# Patient Record
Sex: Male | Born: 1960 | Race: Black or African American | Hispanic: No | Marital: Single | Smoking: Former smoker
Health system: Southern US, Community
[De-identification: ages and names within clinical notes are randomized; demographics above are authoritative.]

## PROBLEM LIST (undated history)

## (undated) DIAGNOSIS — M069 Rheumatoid arthritis, unspecified: Secondary | ICD-10-CM

## (undated) DIAGNOSIS — E785 Hyperlipidemia, unspecified: Secondary | ICD-10-CM

## (undated) DIAGNOSIS — M549 Dorsalgia, unspecified: Secondary | ICD-10-CM

## (undated) DIAGNOSIS — M1990 Unspecified osteoarthritis, unspecified site: Secondary | ICD-10-CM

## (undated) DIAGNOSIS — M2550 Pain in unspecified joint: Secondary | ICD-10-CM

## (undated) DIAGNOSIS — D649 Anemia, unspecified: Secondary | ICD-10-CM

## (undated) DIAGNOSIS — G8929 Other chronic pain: Secondary | ICD-10-CM

## (undated) DIAGNOSIS — M2540 Effusion, unspecified joint: Secondary | ICD-10-CM

## (undated) HISTORY — PX: COLONOSCOPY: SHX174

## (undated) HISTORY — PX: EYE SURGERY: SHX253

## (undated) HISTORY — DX: Hyperlipidemia, unspecified: E78.5

## (undated) HISTORY — PX: JOINT REPLACEMENT: SHX530

---

## 2011-05-26 ENCOUNTER — Encounter (HOSPITAL_COMMUNITY): Payer: Self-pay | Admitting: Pharmacy Technician

## 2011-05-27 ENCOUNTER — Other Ambulatory Visit (HOSPITAL_COMMUNITY): Payer: Self-pay | Admitting: Orthopaedic Surgery

## 2011-05-30 ENCOUNTER — Other Ambulatory Visit (HOSPITAL_COMMUNITY): Payer: Self-pay | Admitting: *Deleted

## 2011-05-31 ENCOUNTER — Other Ambulatory Visit: Payer: Self-pay

## 2011-05-31 ENCOUNTER — Encounter (HOSPITAL_COMMUNITY)
Admission: RE | Admit: 2011-05-31 | Discharge: 2011-05-31 | Disposition: A | Discharge status: Home / Self Care | Payer: Medicare Other | Source: Ambulatory Visit | Attending: Orthopaedic Surgery | Admitting: Orthopaedic Surgery

## 2011-05-31 ENCOUNTER — Encounter (HOSPITAL_COMMUNITY): Payer: Self-pay

## 2011-05-31 HISTORY — DX: Unspecified osteoarthritis, unspecified site: M19.90

## 2011-05-31 LAB — SURGICAL PCR SCREEN: MRSA, PCR: POSITIVE — AB

## 2011-05-31 LAB — URINALYSIS, ROUTINE W REFLEX MICROSCOPIC
Bilirubin Urine: NEGATIVE
Glucose, UA: NEGATIVE mg/dL
Hgb urine dipstick: NEGATIVE
Ketones, ur: 15 mg/dL — AB
Protein, ur: NEGATIVE mg/dL
Urobilinogen, UA: 1 mg/dL (ref 0.0–1.0)

## 2011-05-31 LAB — BASIC METABOLIC PANEL
BUN: 9 mg/dL (ref 6–23)
Chloride: 103 mEq/L (ref 96–112)
Creatinine, Ser: 0.73 mg/dL (ref 0.50–1.35)
GFR calc Af Amer: >90 mL/min (ref >90)
GFR calc non Af Amer: >90 mL/min (ref >90)
Glucose, Bld: 105 mg/dL — ABNORMAL HIGH (ref 70–99)

## 2011-05-31 LAB — CBC
HCT: 32.8 % — ABNORMAL LOW (ref 39.0–52.0)
MCH: 23.1 pg — ABNORMAL LOW (ref 26.0–34.0)
MCHC: 32.9 g/dL (ref 30.0–36.0)
RDW: 14.7 % (ref 11.5–15.5)

## 2011-05-31 LAB — PROTIME-INR: INR: 1.06 (ref 0.00–1.49)

## 2011-05-31 LAB — APTT: aPTT: 33 seconds (ref 24–37)

## 2011-05-31 NOTE — Progress Notes (Signed)
Spoke with Dr. Eliberto Ivory office regarding pt/pt's family request that pt have inpt rehab after surgery.

## 2011-05-31 NOTE — Pre-Procedure Instructions (Signed)
20 Jonathan [name]  05/31/2011   Your procedure is scheduled on:  Tuesday, February 12  Report to [facility] at 10:30 AM.  Call this number if you have problems the morning of surgery: [phone]   Remember:   Do not eat food:After Midnight.  May have clear liquids: up to 4 Hours before arrival.  Clear liquids include soda, tea, black coffee, apple or grape juice, broth.  Take these medicines the morning of surgery with A SIP OF WATER: Norco if needed   Do not wear jewelry, make-up or nail polish.  Do not wear lotions, powders, or perfumes. You may wear deodorant.  Do not shave 48 hours prior to surgery.  Do not bring valuables to the hospital.  Contacts, dentures or bridgework may not be worn into surgery.  Leave suitcase in the car. After surgery it may be brought to your room.  For patients admitted to the hospital, checkout time is 11:00 AM the day of discharge.   Patients discharged the day of surgery will not be allowed to drive home.  Name and phone number of your driver: NA  Special Instructions: CHG Shower Use Special Wash: 1/2 bottle night before surgery and 1/2 bottle morning of surgery.   Please read over the following fact sheets that you were given: Pain Booklet, Coughing and Deep Breathing, Blood Transfusion Information and Surgical Site Infection Prevention

## 2011-06-06 MED ORDER — CEFAZOLIN SODIUM-DEXTROSE 2-3 GM-% IV SOLR
2.0000 g | INTRAVENOUS | Status: AC
  Administered 2011-06-07: 2 g via INTRAVENOUS
  Filled 2011-06-06: qty 50

## 2011-06-07 ENCOUNTER — Encounter (HOSPITAL_COMMUNITY): Payer: Self-pay | Admitting: *Deleted

## 2011-06-07 ENCOUNTER — Encounter (HOSPITAL_COMMUNITY): Payer: Self-pay | Admitting: Certified Registered"

## 2011-06-07 ENCOUNTER — Ambulatory Visit (HOSPITAL_COMMUNITY): Payer: Medicare Other

## 2011-06-07 ENCOUNTER — Encounter (HOSPITAL_COMMUNITY)
Admission: RE | Disposition: A | Discharge status: Skilled Nursing Facility | Payer: Self-pay | Source: Ambulatory Visit | Attending: Orthopaedic Surgery

## 2011-06-07 ENCOUNTER — Ambulatory Visit (HOSPITAL_COMMUNITY): Payer: Medicare Other | Admitting: Certified Registered"

## 2011-06-07 ENCOUNTER — Inpatient Hospital Stay (HOSPITAL_COMMUNITY)
Admission: RE | Admit: 2011-06-07 | Discharge: 2011-06-10 | DRG: 470 | Disposition: A | Discharge status: Skilled Nursing Facility | Payer: Medicare Other | Source: Ambulatory Visit | Attending: Orthopaedic Surgery | Admitting: Orthopaedic Surgery

## 2011-06-07 DIAGNOSIS — Z87891 Personal history of nicotine dependence: Secondary | ICD-10-CM

## 2011-06-07 DIAGNOSIS — M659 Unspecified synovitis and tenosynovitis, unspecified site: Secondary | ICD-10-CM | POA: Diagnosis present

## 2011-06-07 DIAGNOSIS — Z01818 Encounter for other preprocedural examination: Secondary | ICD-10-CM

## 2011-06-07 DIAGNOSIS — M179 Osteoarthritis of knee, unspecified: Secondary | ICD-10-CM

## 2011-06-07 DIAGNOSIS — Z01812 Encounter for preprocedural laboratory examination: Secondary | ICD-10-CM

## 2011-06-07 DIAGNOSIS — D62 Acute posthemorrhagic anemia: Secondary | ICD-10-CM | POA: Diagnosis not present

## 2011-06-07 DIAGNOSIS — M1710 Unilateral primary osteoarthritis, unspecified knee: Principal | ICD-10-CM | POA: Diagnosis present

## 2011-06-07 DIAGNOSIS — Z96659 Presence of unspecified artificial knee joint: Secondary | ICD-10-CM

## 2011-06-07 DIAGNOSIS — E7800 Pure hypercholesterolemia, unspecified: Secondary | ICD-10-CM | POA: Diagnosis present

## 2011-06-07 DIAGNOSIS — M069 Rheumatoid arthritis, unspecified: Secondary | ICD-10-CM | POA: Diagnosis present

## 2011-06-07 DIAGNOSIS — Z01810 Encounter for preprocedural cardiovascular examination: Secondary | ICD-10-CM

## 2011-06-07 HISTORY — PX: KNEE ARTHROPLASTY: SHX992

## 2011-06-07 LAB — TYPE AND SCREEN
ABO/RH(D): B POS
Antibody Screen: POSITIVE

## 2011-06-07 SURGERY — ARTHROPLASTY, KNEE, TOTAL, USING IMAGELESS COMPUTER-ASSISTED NAVIGATION
Anesthesia: Regional | Site: Knee | Laterality: Right | Wound class: Clean

## 2011-06-07 MED ORDER — HYDROMORPHONE HCL PF 1 MG/ML IJ SOLN
0.2500 mg | INTRAMUSCULAR | Status: DC | PRN
  Administered 2011-06-07 (×3): 0.5 mg via INTRAVENOUS

## 2011-06-07 MED ORDER — SODIUM CHLORIDE 0.9 % IJ SOLN: 9.0000 mL | INTRAMUSCULAR | Status: DC | PRN

## 2011-06-07 MED ORDER — SODIUM CHLORIDE 0.9 % IR SOLN
Status: DC | PRN
  Administered 2011-06-07: 3000 mL
  Administered 2011-06-07: 1000 mL

## 2011-06-07 MED ORDER — DIPHENHYDRAMINE HCL 12.5 MG/5ML PO ELIX
12.5000 mg | ORAL_SOLUTION | Freq: Four times a day (QID) | ORAL | Status: DC | PRN
  Filled 2011-06-07: qty 5

## 2011-06-07 MED ORDER — ONDANSETRON HCL 4 MG/2ML IJ SOLN: 4.0000 mg | Freq: Four times a day (QID) | INTRAMUSCULAR | Status: DC | PRN

## 2011-06-07 MED ORDER — LACTATED RINGERS IV SOLN
INTRAVENOUS | Status: DC | PRN
  Administered 2011-06-07 (×3): via INTRAVENOUS

## 2011-06-07 MED ORDER — OXYCODONE HCL 5 MG PO TABS
5.0000 mg | ORAL_TABLET | ORAL | Status: DC | PRN
  Administered 2011-06-08: 10 mg via ORAL
  Administered 2011-06-08: 5 mg via ORAL
  Administered 2011-06-08 – 2011-06-09 (×3): 10 mg via ORAL
  Administered 2011-06-10 (×3): 5 mg via ORAL
  Filled 2011-06-07 (×3): qty 1
  Filled 2011-06-07: qty 2
  Filled 2011-06-07: qty 1
  Filled 2011-06-07 (×3): qty 2

## 2011-06-07 MED ORDER — ONDANSETRON HCL 4 MG/2ML IJ SOLN
INTRAMUSCULAR | Status: DC | PRN
  Administered 2011-06-07: 4 mg via INTRAVENOUS

## 2011-06-07 MED ORDER — LACTATED RINGERS IV SOLN
INTRAVENOUS | Status: DC
  Administered 2011-06-07: 12:00:00 via INTRAVENOUS

## 2011-06-07 MED ORDER — DOCUSATE SODIUM 100 MG PO CAPS
100.0000 mg | ORAL_CAPSULE | Freq: Two times a day (BID) | ORAL | Status: DC
  Administered 2011-06-07 – 2011-06-09 (×4): 100 mg via ORAL
  Administered 2011-06-09: 200 mg via ORAL
  Administered 2011-06-10: 100 mg via ORAL
  Filled 2011-06-07 (×8): qty 1

## 2011-06-07 MED ORDER — ONDANSETRON HCL 4 MG PO TABS: 4.0000 mg | ORAL_TABLET | Freq: Four times a day (QID) | ORAL | Status: DC | PRN

## 2011-06-07 MED ORDER — RIVAROXABAN 10 MG PO TABS
10.0000 mg | ORAL_TABLET | Freq: Every day | ORAL | Status: DC
  Administered 2011-06-08 – 2011-06-10 (×3): 10 mg via ORAL
  Filled 2011-06-07 (×4): qty 1

## 2011-06-07 MED ORDER — MIDAZOLAM HCL 5 MG/5ML IJ SOLN
INTRAMUSCULAR | Status: DC | PRN
  Administered 2011-06-07: 2 mg via INTRAVENOUS

## 2011-06-07 MED ORDER — MIDAZOLAM HCL 2 MG/2ML IJ SOLN
INTRAMUSCULAR | Status: AC
  Filled 2011-06-07: qty 2

## 2011-06-07 MED ORDER — SIMVASTATIN 20 MG PO TABS
20.0000 mg | ORAL_TABLET | Freq: Every day | ORAL | Status: DC
  Administered 2011-06-07 – 2011-06-10 (×4): 20 mg via ORAL
  Filled 2011-06-07 (×5): qty 1

## 2011-06-07 MED ORDER — DIPHENHYDRAMINE HCL 12.5 MG/5ML PO ELIX
12.5000 mg | ORAL_SOLUTION | ORAL | Status: DC | PRN
  Filled 2011-06-07: qty 10

## 2011-06-07 MED ORDER — FENTANYL CITRATE 0.05 MG/ML IJ SOLN
50.0000 ug | INTRAMUSCULAR | Status: DC | PRN
  Administered 2011-06-07: 100 ug via INTRAVENOUS

## 2011-06-07 MED ORDER — MIDAZOLAM HCL 2 MG/2ML IJ SOLN
1.0000 mg | INTRAMUSCULAR | Status: DC | PRN
  Administered 2011-06-07: 2 mg via INTRAVENOUS

## 2011-06-07 MED ORDER — ACETAMINOPHEN 650 MG RE SUPP: 650.0000 mg | Freq: Four times a day (QID) | RECTAL | Status: DC | PRN

## 2011-06-07 MED ORDER — NALOXONE HCL 0.4 MG/ML IJ SOLN: 0.4000 mg | INTRAMUSCULAR | Status: DC | PRN

## 2011-06-07 MED ORDER — FENTANYL CITRATE 0.05 MG/ML IJ SOLN
INTRAMUSCULAR | Status: DC | PRN
  Administered 2011-06-07 (×8): 25 ug via INTRAVENOUS
  Administered 2011-06-07: 50 ug via INTRAVENOUS
  Administered 2011-06-07: 25 ug via INTRAVENOUS

## 2011-06-07 MED ORDER — PHENOL 1.4 % MT LIQD
1.0000 | OROMUCOSAL | Status: DC | PRN
  Filled 2011-06-07: qty 177

## 2011-06-07 MED ORDER — SODIUM CHLORIDE 0.9 % IV SOLN
INTRAVENOUS | Status: DC
  Administered 2011-06-08 – 2011-06-09 (×2): via INTRAVENOUS

## 2011-06-07 MED ORDER — METHOCARBAMOL 500 MG PO TABS
500.0000 mg | ORAL_TABLET | Freq: Four times a day (QID) | ORAL | Status: DC | PRN
  Administered 2011-06-08 – 2011-06-10 (×5): 500 mg via ORAL
  Filled 2011-06-07 (×5): qty 1

## 2011-06-07 MED ORDER — MORPHINE SULFATE 2 MG/ML IJ SOLN: 1.0000 mg | INTRAMUSCULAR | Status: DC | PRN

## 2011-06-07 MED ORDER — DEXTROSE 5 % IV SOLN
500.0000 mg | Freq: Four times a day (QID) | INTRAVENOUS | Status: DC | PRN
  Filled 2011-06-07: qty 5

## 2011-06-07 MED ORDER — HYDROMORPHONE HCL PF 1 MG/ML IJ SOLN
INTRAMUSCULAR | Status: AC
  Administered 2011-06-07: 0.5 mg via INTRAVENOUS
  Filled 2011-06-07: qty 1

## 2011-06-07 MED ORDER — PROPOFOL 10 MG/ML IV EMUL
INTRAVENOUS | Status: DC | PRN
  Administered 2011-06-07: 200 mL via INTRAVENOUS

## 2011-06-07 MED ORDER — METHOCARBAMOL 100 MG/ML IJ SOLN
500.0000 mg | Freq: Once | INTRAVENOUS | Status: AC
  Administered 2011-06-07: 500 mg via INTRAVENOUS
  Filled 2011-06-07: qty 5

## 2011-06-07 MED ORDER — BUPIVACAINE-EPINEPHRINE PF 0.5-1:200000 % IJ SOLN
INTRAMUSCULAR | Status: DC | PRN
  Administered 2011-06-07: 30 mL

## 2011-06-07 MED ORDER — FENTANYL CITRATE 0.05 MG/ML IJ SOLN
INTRAMUSCULAR | Status: AC
  Filled 2011-06-07: qty 2

## 2011-06-07 MED ORDER — METOCLOPRAMIDE HCL 5 MG/ML IJ SOLN
5.0000 mg | Freq: Three times a day (TID) | INTRAMUSCULAR | Status: DC | PRN
  Filled 2011-06-07: qty 2

## 2011-06-07 MED ORDER — MORPHINE SULFATE (PF) 1 MG/ML IV SOLN
INTRAVENOUS | Status: DC
  Administered 2011-06-07: 17:00:00 via INTRAVENOUS
  Administered 2011-06-07: 1.5 mg via INTRAVENOUS
  Administered 2011-06-08: 4.5 mg via INTRAVENOUS
  Administered 2011-06-08: 6 mg via INTRAVENOUS
  Administered 2011-06-08: 18 mg via INTRAVENOUS

## 2011-06-07 MED ORDER — ACETAMINOPHEN 10 MG/ML IV SOLN
INTRAVENOUS | Status: DC | PRN
  Administered 2011-06-07: 1000 mg via INTRAVENOUS

## 2011-06-07 MED ORDER — ACETAMINOPHEN 10 MG/ML IV SOLN
INTRAVENOUS | Status: AC
  Filled 2011-06-07: qty 100

## 2011-06-07 MED ORDER — ACETAMINOPHEN 325 MG PO TABS
650.0000 mg | ORAL_TABLET | Freq: Four times a day (QID) | ORAL | Status: DC | PRN
  Administered 2011-06-08 – 2011-06-09 (×2): 650 mg via ORAL
  Filled 2011-06-07 (×2): qty 2

## 2011-06-07 MED ORDER — ZOLPIDEM TARTRATE 5 MG PO TABS: 5.0000 mg | ORAL_TABLET | Freq: Every evening | ORAL | Status: DC | PRN

## 2011-06-07 MED ORDER — ONDANSETRON HCL 4 MG/2ML IJ SOLN
4.0000 mg | Freq: Four times a day (QID) | INTRAMUSCULAR | Status: DC | PRN
  Administered 2011-06-09: 4 mg via INTRAVENOUS
  Filled 2011-06-07: qty 2

## 2011-06-07 MED ORDER — MORPHINE SULFATE (PF) 1 MG/ML IV SOLN
INTRAVENOUS | Status: AC
  Filled 2011-06-07: qty 25

## 2011-06-07 MED ORDER — CEFAZOLIN SODIUM 1-5 GM-% IV SOLN
1.0000 g | Freq: Four times a day (QID) | INTRAVENOUS | Status: AC
  Administered 2011-06-07 – 2011-06-08 (×3): 1 g via INTRAVENOUS
  Filled 2011-06-07 (×3): qty 50

## 2011-06-07 MED ORDER — HYDROCODONE-ACETAMINOPHEN 5-325 MG PO TABS: 1.0000 | ORAL_TABLET | ORAL | Status: DC | PRN

## 2011-06-07 MED ORDER — MENTHOL 3 MG MT LOZG: 1.0000 | LOZENGE | OROMUCOSAL | Status: DC | PRN

## 2011-06-07 MED ORDER — DIPHENHYDRAMINE HCL 50 MG/ML IJ SOLN: 12.5000 mg | Freq: Four times a day (QID) | INTRAMUSCULAR | Status: DC | PRN

## 2011-06-07 MED ORDER — ALUM & MAG HYDROXIDE-SIMETH 200-200-20 MG/5ML PO SUSP
30.0000 mL | ORAL | Status: DC | PRN
  Administered 2011-06-09: 30 mL via ORAL
  Filled 2011-06-07: qty 30

## 2011-06-07 MED ORDER — METOCLOPRAMIDE HCL 10 MG PO TABS: 5.0000 mg | ORAL_TABLET | Freq: Three times a day (TID) | ORAL | Status: DC | PRN

## 2011-06-07 SURGICAL SUPPLY — 62 items
BANDAGE ELASTIC 6 VELCRO ST LF (GAUZE/BANDAGES/DRESSINGS) ×2 IMPLANT
BANDAGE ESMARK 6X9 LF (GAUZE/BANDAGES/DRESSINGS) ×1 IMPLANT
BLADE SAGITTAL 25.0X1.19X90 (BLADE) ×4 IMPLANT
BLADE SAW SGTL 13.0X1.19X90.0M (BLADE) ×2 IMPLANT
BNDG ESMARK 6X9 LF (GAUZE/BANDAGES/DRESSINGS) ×2
BOWL SMART MIX CTS (DISPOSABLE) ×2 IMPLANT
CEMENT HV SMART SET (Cement) ×4 IMPLANT
CLOTH BEACON ORANGE TIMEOUT ST (SAFETY) ×2 IMPLANT
COVER BACK TABLE 24X17X13 BIG (DRAPES) IMPLANT
COVER SURGICAL LIGHT HANDLE (MISCELLANEOUS) ×2 IMPLANT
CUFF TOURNIQUET SINGLE 34IN LL (TOURNIQUET CUFF) ×2 IMPLANT
CUFF TOURNIQUET SINGLE 44IN (TOURNIQUET CUFF) IMPLANT
DRAPE INCISE IOBAN 66X45 STRL (DRAPES) ×2 IMPLANT
DRAPE ORTHO SPLIT 77X108 STRL (DRAPES) ×2
DRAPE PROXIMA HALF (DRAPES) ×2 IMPLANT
DRAPE SURG ORHT 6 SPLT 77X108 (DRAPES) ×2 IMPLANT
DRAPE U-SHAPE 47X51 STRL (DRAPES) ×2 IMPLANT
DRSG PAD ABDOMINAL 8X10 ST (GAUZE/BANDAGES/DRESSINGS) ×2 IMPLANT
DURAPREP 26ML APPLICATOR (WOUND CARE) ×2 IMPLANT
ELECT REM PT RETURN 9FT ADLT (ELECTROSURGICAL) ×2
ELECTRODE REM PT RTRN 9FT ADLT (ELECTROSURGICAL) ×1 IMPLANT
EVACUATOR 1/8 PVC DRAIN (DRAIN) ×2 IMPLANT
FACESHIELD LNG OPTICON STERILE (SAFETY) ×6 IMPLANT
GAUZE SPONGE 4X4 12PLY STRL LF (GAUZE/BANDAGES/DRESSINGS) ×2 IMPLANT
GAUZE XEROFORM 5X9 LF (GAUZE/BANDAGES/DRESSINGS) ×2 IMPLANT
GLOVE BIO SURGEON STRL SZ7.5 (GLOVE) ×2 IMPLANT
GLOVE BIOGEL PI IND STRL 8 (GLOVE) ×1 IMPLANT
GLOVE BIOGEL PI INDICATOR 8 (GLOVE) ×1
GLOVE ECLIPSE 7.0 STRL STRAW (GLOVE) ×2 IMPLANT
GLOVE ORTHO TXT STRL SZ7.5 (GLOVE) ×2 IMPLANT
GOWN PREVENTION PLUS LG XLONG (DISPOSABLE) IMPLANT
GOWN PREVENTION PLUS XLARGE (GOWN DISPOSABLE) ×2 IMPLANT
GOWN STRL NON-REIN LRG LVL3 (GOWN DISPOSABLE) ×4 IMPLANT
HANDPIECE INTERPULSE COAX TIP (DISPOSABLE)
IMMOBILIZER KNEE 22 UNIV (SOFTGOODS) ×2 IMPLANT
KIT BASIN OR (CUSTOM PROCEDURE TRAY) ×2 IMPLANT
KIT ROOM TURNOVER OR (KITS) ×2 IMPLANT
MANIFOLD NEPTUNE II (INSTRUMENTS) ×2 IMPLANT
MARKER SPHERE PSV REFLC THRD 5 (MARKER) ×6 IMPLANT
NEEDLE SPNL 18GX3.5 QUINCKE PK (NEEDLE) ×2 IMPLANT
NS IRRIG 1000ML POUR BTL (IV SOLUTION) ×2 IMPLANT
PACK TOTAL JOINT (CUSTOM PROCEDURE TRAY) ×2 IMPLANT
PAD ARMBOARD 7.5X6 YLW CONV (MISCELLANEOUS) ×4 IMPLANT
PADDING CAST COTTON 6X4 STRL (CAST SUPPLIES) ×2 IMPLANT
PADDING WEBRIL 6 STERILE (GAUZE/BANDAGES/DRESSINGS) ×2 IMPLANT
PIN SCHANZ 4MM 130MM (PIN) IMPLANT
SET HNDPC FAN SPRY TIP SCT (DISPOSABLE) IMPLANT
SPONGE GAUZE 4X4 12PLY (GAUZE/BANDAGES/DRESSINGS) ×2 IMPLANT
STAPLER VISISTAT 35W (STAPLE) ×2 IMPLANT
STRIP CLOSURE SKIN 1/2X4 (GAUZE/BANDAGES/DRESSINGS) ×4 IMPLANT
SUCTION FRAZIER TIP 10 FR DISP (SUCTIONS) IMPLANT
SUT ETHILON 3 0 PS 1 (SUTURE) ×2 IMPLANT
SUT VIC AB 1 CT1 27 (SUTURE) ×2
SUT VIC AB 1 CT1 27XBRD ANBCTR (SUTURE) ×2 IMPLANT
SUT VIC AB 2-0 CT1 27 (SUTURE) ×2
SUT VIC AB 2-0 CT1 TAPERPNT 27 (SUTURE) ×2 IMPLANT
SUT VIC AB 4-0 PS2 27 (SUTURE) ×2 IMPLANT
SWABSTICK BENZOIN STERILE (MISCELLANEOUS) ×2 IMPLANT
TOWEL OR 17X24 6PK STRL BLUE (TOWEL DISPOSABLE) ×2 IMPLANT
TOWEL OR 17X26 10 PK STRL BLUE (TOWEL DISPOSABLE) ×2 IMPLANT
TRAY FOLEY CATH 14FR (SET/KITS/TRAYS/PACK) ×2 IMPLANT
WATER STERILE IRR 1000ML POUR (IV SOLUTION) ×2 IMPLANT

## 2011-06-07 NOTE — Progress Notes (Signed)
Report to Kay RN as caregiver.  

## 2011-06-07 NOTE — Preoperative (Signed)
Beta Blockers   Reason not to administer Beta Blockers:Not Applicable 

## 2011-06-07 NOTE — Progress Notes (Signed)
Orthopedic Tech Progress Note Patient Details:  Jonathan [name] 05-09-1960 [id number] Applied cpm and overhead frame Patient ID: Jonathan [name], male   DOB: 02/20/1961, 51 y.o.   MRN: [id number]   [name] 06/07/2011, 5:26 PM

## 2011-06-07 NOTE — Brief Op Note (Signed)
06/07/2011  3:07 PM  PATIENT:  Jonathan [name]  51 y.o. male  PRE-OPERATIVE DIAGNOSIS:  severe arthritis right knee  POST-OPERATIVE DIAGNOSIS:  severe arthritis right knee  PROCEDURE:  Procedure(s) (LRB): COMPUTER ASSISTED TOTAL KNEE ARTHROPLASTY (Right)  SURGEON:  Surgeon(s) and Role:    * Kathryne Hitch, MD - Primary  PHYSICIAN ASSISTANT:   ASSISTANTS: Patrick Jupiter, RNFA   ANESTHESIA:   general  EBL:  Total I/O In: 2250 [I.V.:2250] Out: 375 [Urine:275; Blood:100]  BLOOD ADMINISTERED:none  DRAINS: (medium) Hemovact drain(s) in the knee with  Suction Open   LOCAL MEDICATIONS USED:  NONE  SPECIMEN:  No Specimen  DISPOSITION OF SPECIMEN:  N/A  COUNTS:  YES  TOURNIQUET:   Total Tourniquet Time Documented: Thigh (Right) - 81 minutes  DICTATION: .Other Dictation: Dictation Number [id number]  PLAN OF CARE: Admit to inpatient   PATIENT DISPOSITION:  PACU - hemodynamically stable.   Delay start of Pharmacological VTE agent (>24hrs) due to surgical blood loss or risk of bleeding: yes

## 2011-06-07 NOTE — Anesthesia Procedure Notes (Signed)
Anesthesia Regional Block:  Femoral nerve block  Pre-Anesthetic Checklist: ,, timeout performed, Correct Patient, Correct Site, Correct Laterality, Correct Procedure,, site marked, risks and benefits discussed, Surgical consent,  Pre-op evaluation,  At surgeon's request and post-op pain management  Laterality: Right  Prep: chloraprep       Needles:  Injection technique: Single-shot  Needle Type: Echogenic Stimulator Needle     Needle Length: 9cm  Needle Gauge: 21    Additional Needles:  Procedures: nerve stimulator Femoral nerve block  Nerve Stimulator or Paresthesia:  Response: Quadriceps muscle contraction, 0.45 mA,   Additional Responses:   Narrative:  Start time: 06/07/2011 11:50 AM End time: 06/07/2011 12:05 PM Injection made incrementally with aspirations every 5 mL.  Performed by: Personally  Anesthesiologist: Dr Chaney Malling  Additional Notes: Functioning IV was confirmed and monitors were applied.  A 90mm 21ga Arrow echogenic stimulator needle was used. Sterile prep and drape,hand hygiene and sterile gloves were used.  Negative aspiration and negative test dose prior to incremental administration of local anesthetic. The patient tolerated the procedure well.    Femoral nerve block

## 2011-06-07 NOTE — Transfer of Care (Signed)
Immediate Anesthesia Transfer of Care Note  Patient: Jonathan [name]  Procedure(s) Performed: Procedure(s) (LRB): COMPUTER ASSISTED TOTAL KNEE ARTHROPLASTY (Right)  Patient Location: PACU  Anesthesia Type: General  Level of Consciousness: awake, alert , oriented and patient cooperative  Airway & Oxygen Therapy: Patient Spontanous Breathing and Patient connected to nasal cannula oxygen  Post-op Assessment: Report given to PACU RN, Post -op Vital signs reviewed and stable and Patient moving all extremities  Post vital signs: Reviewed and stable  Complications: No apparent anesthesia complications

## 2011-06-07 NOTE — Anesthesia Preprocedure Evaluation (Signed)
Anesthesia Evaluation  Patient identified by MRN, date of birth, ID band Patient awake    Reviewed: Allergy & Precautions, H&P , NPO status , Patient's Chart, lab work & pertinent test results  Airway Mallampati: II  Neck ROM: full    Dental   Pulmonary former smoker         Cardiovascular     Neuro/Psych    GI/Hepatic   Endo/Other    Renal/GU      Musculoskeletal  (+) Arthritis -, Rheumatoid disorders,    Abdominal   Peds  Hematology   Anesthesia Other Findings   Reproductive/Obstetrics                           Anesthesia Physical Anesthesia Plan  ASA: II  Anesthesia Plan: General and Regional   Post-op Pain Management: MAC Combined w/ Regional for Post-op pain   Induction: Intravenous  Airway Management Planned: LMA  Additional Equipment:   Intra-op Plan:   Post-operative Plan:   Informed Consent: I have reviewed the patients History and Physical, chart, labs and discussed the procedure including the risks, benefits and alternatives for the proposed anesthesia with the patient or authorized representative who has indicated his/her understanding and acceptance.     Plan Discussed with: CRNA and Surgeon  Anesthesia Plan Comments:         Anesthesia Quick Evaluation

## 2011-06-07 NOTE — Anesthesia Postprocedure Evaluation (Signed)
  Anesthesia Post-op Note  Patient: Jonathan [name]  Procedure(s) Performed: Procedure(s) (LRB): COMPUTER ASSISTED TOTAL KNEE ARTHROPLASTY (Right)  Patient Location: PACU  Anesthesia Type: General  Level of Consciousness: awake  Airway and Oxygen Therapy: Patient Spontanous Breathing  Post-op Pain: mild  Post-op Assessment: Post-op Vital signs reviewed  Post-op Vital Signs: stable  Complications: No apparent anesthesia complications

## 2011-06-07 NOTE — H&P (Signed)
Jonathan [name] is an 51 y.o. male.   Chief Complaint:   Severe right knee pain with known OA/DJD HPI:   51 yo male with severe right knee pain and know arthritis.  Failed injections and arthroscopic surgery.  Has had previous successful left total knee replacement.  Wishes to now proceed with a right total knee replacement.  Understands the risks of blood loss, nerve and vessle injury, DVT and PE.  The benefits are improved mobility and quality of life and decreased pain.    Past Medical History  Diagnosis Date  . Arthritis     rheumatoid  . Hypercholesterolemia     Past Surgical History  Procedure Date  . Joint replacement     L TKA    History reviewed. No pertinent family history. Social History:  reports that he has quit smoking. He does not have any smokeless tobacco history on file. He reports that he does not drink alcohol or use illicit drugs.  Allergies: No Known Allergies  Medications Prior to Admission  Medication Dose Route Frequency Provider Last Rate Last Dose  . ceFAZolin (ANCEF) IVPB 2 g/50 mL premix  2 g Intravenous 60 min Pre-Op Kathryne Hitch, MD      . fentaNYL (SUBLIMAZE) injection 50 mcg  50 mcg Intravenous PRN Raiford Simmonds, MD   100 mcg at 06/07/11 1152  . lactated ringers infusion   Intravenous Continuous Raiford Simmonds, MD 50 mL/hr at 06/07/11 1150    . midazolam (VERSED) injection 1 mg  1 mg Intravenous PRN Raiford Simmonds, MD   2 mg at 06/07/11 1152   No current outpatient prescriptions on file as of 06/07/2011.    Results for orders placed during the hospital encounter of 06/07/11 (from the past 48 hour(s))  TYPE AND SCREEN     Status: Normal   Collection Time   06/07/11  9:50 AM      Component Value Range Comment   ABO/RH(D) B POS      Antibody Screen POS      Sample Expiration 06/10/2011      No results found.  Review of Systems  All other systems reviewed and are negative.    Blood pressure 120/81, pulse 89, temperature 98.9 F  (37.2 C), temperature source Oral, resp. rate 18, SpO2 99.00%. Physical Exam  Constitutional: He is oriented to person, place, and time. He appears well-developed and well-nourished.  HENT:  Head: Normocephalic and atraumatic.  Eyes: EOM are normal. Pupils are equal, round, and reactive to light.  Neck: Normal range of motion. Neck supple.  Cardiovascular: Normal rate and regular rhythm.   Respiratory: Effort normal and breath sounds normal.  GI: Soft. Bowel sounds are normal.  Musculoskeletal:       Right knee: He exhibits decreased range of motion and effusion. tenderness found. Medial joint line and lateral joint line tenderness noted.  Neurological: He is alert and oriented to person, place, and time.  Skin: Skin is warm and dry.  Psychiatric: He has a normal mood and affect.     Assessment/Plan To the OR today for a right total knee replacement then admission as an inpatient.  Ferdinando Lodge Y 06/07/2011, 11:58 AM

## 2011-06-08 ENCOUNTER — Encounter (HOSPITAL_COMMUNITY): Payer: Self-pay | Admitting: Orthopaedic Surgery

## 2011-06-08 LAB — CBC
MCHC: 32.9 g/dL (ref 30.0–36.0)
Platelets: 335 10*3/uL (ref 150–400)
RDW: 15 % (ref 11.5–15.5)
WBC: 7.1 10*3/uL (ref 4.0–10.5)

## 2011-06-08 LAB — GLUCOSE, CAPILLARY
Glucose-Capillary: 162 mg/dL — ABNORMAL HIGH (ref 70–99)
Glucose-Capillary: 122 mg/dL — ABNORMAL HIGH (ref 70–99)
Glucose-Capillary: 159 mg/dL — ABNORMAL HIGH (ref 70–99)

## 2011-06-08 LAB — BASIC METABOLIC PANEL
BUN: 8 mg/dL (ref 6–23)
GFR calc Af Amer: >90 mL/min (ref >90)
GFR calc non Af Amer: >90 mL/min (ref >90)
Potassium: 3.7 mEq/L (ref 3.5–5.1)
Sodium: 133 mEq/L — ABNORMAL LOW (ref 135–145)

## 2011-06-08 MED ORDER — MORPHINE SULFATE (PF) 1 MG/ML IV SOLN
INTRAVENOUS | Status: AC
  Administered 2011-06-08: 9 mg
  Filled 2011-06-08: qty 25

## 2011-06-08 NOTE — Progress Notes (Signed)
Clinical Social Worker completed psychosocial assessment.  Please see shadow chart for details.  CSW to fax pt out to [facility] snfs and review bed offers with pt and family on 2/14.  Angelia Mould, MSW, [place] [phone]

## 2011-06-08 NOTE — Progress Notes (Signed)
PT Treatment Note   06/08/11 1500  PT Visit Information  Last PT Received On 06/08/11  Precautions  Precautions Knee  Restrictions  RLE Weight Bearing WBAT  Bed Mobility  Bed Mobility Yes  Sit to Supine 4: Min assist  Sit to Supine - Details (indicate cue type and reason) assist for R LE managment  Transfers  Sit to Stand 3: Mod assist  Sit to Stand Details (indicate cue type and reason) increased assist from decreased surface height due to decreased UE strength secondary to RA in bilat hands  Ambulation/Gait  Ambulation/Gait Assistance 4: Min assist  Ambulation/Gait Assistance Details (indicate cue type and reason) cues to complete R quad set, pt able to tolerate ambulation without KI  Ambulation Distance (Feet) 150 Feet  Assistive device Rolling walker  Gait Pattern Step-to pattern  Gait velocity decreased  Stairs No  Wheelchair Mobility  Wheelchair Mobility No  Posture/Postural Control  Posture/Postural Control No significant limitations  Exercises  Exercises Total Joint  Total Joint Exercises  Ankle Circles/Pumps AROM;10 reps;Seated;Both  Quad Sets AROM;Right;10 reps  Heel Slides AROM;Right;10 reps;Seated (with towel beneath foot)  Hip ABduction/ADduction Right;10 reps;Supine;AAROM  Straight Leg Raises AROM;Right;10 reps;Supine  PT - End of Session  Equipment Utilized During Treatment Gait belt  Activity Tolerance Patient tolerated treatment well  Patient left in bed;in CPM;with call bell in reach (ortho tech called to make adjustment for optimal positioning)  Nurse Communication (RN notified pt left in CPM)  General  Behavior During Session [facility] for tasks performed  Cognition [facility] for tasks performed  PT - Assessment/Plan  Comments on Treatment Session Patient able to tolerate ambulation without R KI this date with no episode of buckling. patient con't to have decreased active R knee flexion.  PT Plan Discharge plan remains appropriate;Frequency remains appropriate    PT Frequency 7X/week  Follow Up Recommendations Skilled nursing facility  Equipment Recommended Defer to next venue  Acute Rehab PT Goals  PT Goal Formulation With patient  PT Goal: Supine/Side to Sit - Progress Progressing toward goal  PT Goal: Sit to Stand - Progress Progressing toward goal  PT Goal: Ambulate - Progress Progressing toward goal  PT Goal: Perform Home Exercise Program - Progress Progressing toward goal   Lewis Shock, PT, DPT Pager #: [phone] Office #: [phone]

## 2011-06-08 NOTE — Progress Notes (Signed)
CARE MANAGEMENT NOTE 06/08/2011  Action/Plan:   Discharge planning.Spoke with patient. He was preoperatively setup with [facility]. States he needs to go to a facility prior to home. He has no help at home,lives with his cousin and he is ill.   Anticipated DC Date:  06/10/2011   Anticipated DC Plan:  SKILLED NURSING FACILITY  In-house referral  Clinical Social Worker  DC Planning Services CM consult Status of service:  Completed, signed off

## 2011-06-08 NOTE — Evaluation (Signed)
Occupational Therapy Evaluation Patient Details Name: Jonathan [name] MRN: [id number] DOB: 13-Apr-1961 Today's Date: 06/08/2011  Problem List:  Patient Active Problem List  Diagnoses  . Degenerative arthritis of knee    Past Medical History:  Past Medical History  Diagnosis Date  . Arthritis     rheumatoid  . Hypercholesterolemia    Past Surgical History:  Past Surgical History  Procedure Date  . Joint replacement     L TKA  . Knee arthroplasty 06/07/2011    Procedure: COMPUTER ASSISTED TOTAL KNEE ARTHROPLASTY;  Surgeon: Kathryne Hitch, MD;  Location: [facility] OR;  Service: Orthopedics;  Laterality: Right;  Right total knee arthoplasty    OT Assessment/Plan/Recommendation OT Assessment Clinical Impression Statement: Pt. presents s/p Rt. TKA and with increased pain affecting PLOF of indpendent. Pt. will benefit from skilled OT to increase functional level to supervision level at D/C OT Recommendation/Assessment: Patient will need skilled OT in the acute care venue OT Problem List: Decreased strength;Decreased activity tolerance;Decreased safety awareness;Decreased knowledge of use of DME or AE;Decreased knowledge of precautions Barriers to Discharge: None OT Therapy Diagnosis : Generalized weakness;Acute pain OT Plan OT Frequency: Min 2X/week OT Treatment/Interventions: Self-care/ADL training;DME and/or AE instruction;Therapeutic activities;Patient/family education;Balance training OT Recommendation Follow Up Recommendations: Skilled nursing facility;Home health OT;Supervision - Intermittent Equipment Recommended: Defer to next venue Individuals Consulted Consulted and Agree with Results and Recommendations: Patient OT Goals Acute Rehab OT Goals OT Goal Formulation: With patient Time For Goal Achievement: 7 days ADL Goals Pt Will Perform Grooming: with set-up;with supervision;[name] at sink ADL Goal: Grooming - Progress: Goal set today Pt Will Perform Lower Body  Dressing: with set-up;with supervision;with adaptive equipment;Sit to stand from chair ADL Goal: Lower Body Dressing - Progress: Goal set today Pt Will Transfer to Toilet: with set-up;with supervision;3-in-1;with DME;Ambulation ADL Goal: Toilet Transfer - Progress: Goal set today  OT Evaluation Precautions/Restrictions  Precautions Precautions: Knee Required Braces or Orthoses: Yes Knee Immobilizer: Discontinue once straight leg raise with < 10 degree lag Restrictions Weight Bearing Restrictions: Yes RLE Weight Bearing: Weight bearing as tolerated Prior Functioning Home Living Lives With: Family (cousins) Receives Help From: Family Type of Home: House Home Layout: One level Home Access: Level entry Bathroom Shower/Tub: Child psychotherapist: Standard Home Adaptive Equipment: Straight cane Additional Comments: Patient reports he desires to go to [facility] rehab Prior Function Level of Independence: Requires assistive device for independence;Independent with basic ADLs;Independent with gait;Independent with transfers Driving: No Vocation: Retired ADL ADL Eating/Feeding: Performed;Independent Where Assessed - Eating/Feeding: Chair Grooming: Performed;Wash/dry hands;Set up;Minimal assistance Grooming Details (indicate cue type and reason): Min assist for balance and min verbal cues for safety with RW  Where Assessed - Grooming: [name] at sink Upper Body Bathing: Simulated;Chest;Right arm;Left arm;Abdomen;Set up Where Assessed - Upper Body Bathing: Sitting, chair Lower Body Bathing: Simulated;Moderate assistance Where Assessed - Lower Body Bathing: Sit to stand from chair Upper Body Dressing: Performed;Minimal assistance Upper Body Dressing Details (indicate cue type and reason): with donning gown Where Assessed - Upper Body Dressing: Sitting, chair Lower Body Dressing: Simulated;Moderate assistance Where Assessed - Lower Body Dressing: Sit to stand from  chair Toilet Transfer: Performed;Minimal assistance Toilet Transfer Details (indicate cue type and reason): Mod verbal cues for hand placement and facilitation at hips to promote upright position Toilet Transfer Method: Ambulating Toilet Transfer Equipment: Raised toilet seat with arms (or 3-in-1 over toilet) Toileting - Clothing Manipulation: Simulated;Minimal assistance Where Assessed - Toileting Clothing Manipulation: [name] Toileting - Hygiene: Simulated;Minimal assistance Where Assessed - Toileting  Hygiene: [name] Equipment Used: Rolling walker Ambulation Related to ADLs: Pt. min assist for balance and cues for steering RW around objects in room ADL Comments: Pt. educated on use of AE for LB ADLs and techniques for safe transfers during mobility with ADLs.   Extremity assessment RUE Assessment RUE Assessment: Within Functional Limits LUE Assessment LUE Assessment: Within Functional Limits Mobility  Bed Mobility Bed Mobility: No Transfers Transfers: Yes Sit to Stand: 3: Mod assist;From chair/3-in-1 Sit to Stand Details (indicate cue type and reason): Mod verbal cues for bil UE placement and use of RW to increase safety    End of Session OT - End of Session Equipment Utilized During Treatment: Gait belt;Right knee immobilizer Activity Tolerance: Patient tolerated treatment well Patient left: in chair;with call bell in reach Nurse Communication: Mobility status for transfers General Behavior During Session: [facility] for tasks performed Cognition: [facility] for tasks performed   Carry Ortez, OTR/L Pager [phone] 06/08/2011, 2:51 PM

## 2011-06-08 NOTE — Op Note (Signed)
NAME:  Jonathan [name], Jonathan [name] NO.:  [id number]  MEDICAL RECORD NO.:  [id number]  LOCATION:  5014                         FACILITY:  [facility]  PHYSICIAN:  Vanita Panda. Magnus Ivan, M.D.DATE OF BIRTH:  1960/11/05  DATE OF PROCEDURE:  06/07/2011 DATE OF DISCHARGE:                              OPERATIVE REPORT   PREOPERATIVE DIAGNOSIS:  Severe osteoarthritis and synovitis right knee.  POSTOPERATIVE DIAGNOSIS:  Severe osteoarthritis and synovitis right knee.  PROCEDURE:  Right total knee arthroplasty utilizing computer navigation as assistance.  IMPLANTS:  DePuy PFC Sigma rotating platform knee with size 3 femur, size 2.5 tibial tray, 10-mm polyethylene insert, 32-mm patellar button.  SURGEON:  Vanita Panda. Magnus Ivan, MD.  ASSISTANT:  Jonathan [name], RNFA.  ANESTHESIA:  General.  BLOOD LOSS:  Less than 200 mL.  TOURNIQUET TIME:  Less than 2 hours.  COMPLICATIONS:  None.  ANTIBIOTICS:  Ancef 2 g IV.  INDICATIONS:  Jonathan [name] is a 51 year old gentleman with rheumatoid arthritis.  He is on methotrexate and has had severe synovitis in both his knees.  He underwent a successful left total knee arthroplasty about 4 years ago in [place].  Due to the continued pain in his right knee and failure of conservative nonoperative treatment including a synovectomy done in [place] as well as the injections, he wished to proceed with a total knee arthroplasty on his right knee.  He was very pleased how the left knee went, and he definitely wished to proceed with right knee.  He understands the risks and benefits of surgery in detail including the risk of acute blood loss anemia, DVT and PE, and the best sense of decreased pain and improve quality of life and function.  PROCEDURE DESCRIPTION:  After informed consent was obtained, appropriate right knee was marked.  He was brought to the operating room and placed supine on the operating table.  General anesthesia was then  obtained.  A nonsterile tourniquet was placed on his upper right leg from the thigh down to the ankle and prepped and draped with DuraPrep and sterile drapes including sterile stockinette.  A time-out was called and we identified the correct patient and correct right knee.  I then wrapped out the leg with an Esmarch and tourniquet was inflated to 300 mm of pressure.  A midline incision was then made directly over the patella and carried proximally and distally.  I was able to dissect down to the knee joint itself and then performed a medial parapatellar arthrotomy. There was a significant effusion in the knee and very angry synovium throughout as well as arthritic changes.  Once I cleaned the knee of debris, I proceeded with computer navigation portion of the case.  I placed 2 temporary Steinmann pins in the tibia through 2 small stab incisions to the distal 1/3 of the tibia from an anteromedial to posterolateral direction in the femur in the metaphyseal section, and through the main incision I also placed 2 temporary Steinmann pins.  On these pins, small globes were placed so I could then generate computer mapped model of the knee selecting the hip rotation as well as several points throughout the knee and down to the ankle.  Once we computer  generated the model of the knee according to computer plan and direct visualization, we decided to take 10 mm off the high side with our tibial cut.  We then made our tibial cut and used tension and some flexion and extension to help [place] perform medial release as well as removing posterior tissue.  I then used a computer plan to size 4 and size 3 femur.  I made my distal femoral cut and then used an extension block and found I was balanced in extension.  We then set our femoral rotation guide and made our anterior and posterior cuts, followed by our chamfer cuts.  We then made our femoral box cut.  Attention was then turned again to the tibia.  I  sized a size 3 tibia, and then felt a size 2.5 fit better.  We prepared the knee for then a 2.5 tibial tray.  I then placed a trial tibia tray, followed by the trial size 3 femur.  We drilled pegs and cut for a size 32 patellar button.  With all trial components in place, the knee was balanced in flexion and extension taking out of the varus that he was in, as well as flexion contracture that he had.  I then removed all trial components.  We copiously irrigated the knee with normal saline solution using pulsatile lavage. We then cemented the real size 2.5 tibial tray from DePuy, followed by the real size 3 femur.  We placed a real 10-mm polyethylene insert and cemented the patellar button.  Once the cement had dried and hardened, we let the tourniquet down and hemostasis was obtained with electrocautery.  I then placed a medium Hemovac drain in the arthrotomy and closed the arthrotomy with interrupted #1 Vicryl suture.  I then closed the subcutaneous tissue with interrupted 2-0 Vicryl and staples on the skin.  I did remove all Steinmann pins as well.  We then placed well-padded sterile dressing, and the patient was awakened, extubated, and taken to recovery room in stable condition.  All final counts were correct.  There were no complications noted.     Vanita Panda. Magnus Ivan, M.D.     CYB/MEDQ  D:  06/07/2011  T:  06/08/2011  Job:  [id number]

## 2011-06-08 NOTE — Progress Notes (Signed)
Subjective: 1 Day Post-Op Procedure(s) (LRB): COMPUTER ASSISTED TOTAL KNEE ARTHROPLASTY (Right) Patient reports pain as mild.  He expresses a desire for rehab or ST-SNF post this acute hospital stay.  Objective: Vital signs in last 24 hours: Temp:  [97.4 F (36.3 C)-101.1 F (38.4 C)] 99.9 F (37.7 C) (02/13 0624) Pulse Rate:  [87-120] 120  (02/13 0624) Resp:  [9-22] 18  (02/13 0624) BP: (107-131)/(63-85) 121/63 mmHg (02/13 0624) SpO2:  [98 %-100 %] 100 % (02/13 0624)  Intake/Output from previous day: 02/12 0701 - 02/13 0700 In: 3210 [I.V.:3210] Out: 3070 [Urine:2705; Drains:265; Blood:100] Intake/Output this shift:     Basename 06/08/11 0545  HGB 8.5*    Basename 06/08/11 0545  WBC 7.1  RBC 3.68*  HCT 25.8*  PLT 335   No results found for this basename: NA:2,K:2,CL:2,CO2:2,BUN:2,CREATININE:2,GLUCOSE:2,CALCIUM:2 in the last 72 hours No results found for this basename: LABPT:2,INR:2 in the last 72 hours  Sensation intact distally Intact pulses distally Dorsiflexion/Plantar flexion intact Incision: scant drainage Compartment soft  Assessment/Plan: 1 Day Post-Op Procedure(s) (LRB): COMPUTER ASSISTED TOTAL KNEE ARTHROPLASTY (Right) Up with therapy  Jonathan [name] Y 06/08/2011, 7:17 AM

## 2011-06-08 NOTE — Progress Notes (Signed)
UR COMPLETED  

## 2011-06-08 NOTE — Progress Notes (Signed)
Physical Therapy Evaluation Patient Details Name: Jonathan [name] MRN: [id number] DOB: 1961/01/19 Today's Date: 06/08/2011  Problem List:  Patient Active Problem List  Diagnoses  . Degenerative arthritis of knee    Past Medical History:  Past Medical History  Diagnosis Date  . Arthritis     rheumatoid  . Hypercholesterolemia    Past Surgical History:  Past Surgical History  Procedure Date  . Joint replacement     L TKA    PT Assessment/Plan/Recommendation PT Assessment Clinical Impression Statement: Patient tolerated ambulation well with minimal pain. patient s/p R THA presenting with decreased R knee active ROM and strength requiring increased assist for all mobility. Due to limited assist at home patient desires to go to SNF upon d/c. PT Recommendation/Assessment: Patient will need skilled PT in the acute care venue PT Problem List: Decreased strength;Decreased range of motion;Decreased activity tolerance;Decreased balance;Decreased mobility Barriers to Discharge: Decreased caregiver support PT Therapy Diagnosis : Difficulty walking;Abnormality of gait;Generalized weakness;Acute pain PT Plan PT Frequency: 7X/week PT Treatment/Interventions: Gait training;Stair training;Functional mobility training;Therapeutic activities;Therapeutic exercise PT Recommendation Follow Up Recommendations: Skilled nursing facility Equipment Recommended: Defer to next venue PT Goals  Acute Rehab PT Goals PT Goal Formulation: With patient Time For Goal Achievement: 7 days Pt will go Supine/Side to Sit: with modified independence PT Goal: Supine/Side to Sit - Progress: Goal set today Pt will go Sit to Stand: with modified independence PT Goal: Sit to Stand - Progress: Goal set today Pt will Ambulate: >150 feet;with supervision;with rolling walker PT Goal: Ambulate - Progress: Goal set today Pt will Perform Home Exercise Program: Independently PT Goal: Perform Home Exercise Program -  Progress: Goal set today  PT Evaluation Precautions/Restrictions  Precautions Precautions: Knee Required Braces or Orthoses: Yes Knee Immobilizer: Discontinue once straight leg raise with < 10 degree lag Restrictions Weight Bearing Restrictions: Yes RLE Weight Bearing: Weight bearing as tolerated Prior Functioning  Home Living Lives With: Family (cousins) Receives Help From: Family Type of Home: House Home Layout: One level Home Access: Level entry Bathroom Shower/Tub: Child psychotherapist: Standard Home Adaptive Equipment: Straight cane Additional Comments: Patient reports he desires to go to ST rehab Prior Function Level of Independence: Requires assistive device for independence;Independent with basic ADLs;Independent with gait;Independent with transfers Driving: No Vocation: Retired Producer, television/film/video: Awake/alert Overall Cognitive Status: Appears within functional limits for tasks assessed Orientation Level: Oriented X4 Sensation/Coordination Sensation Light Touch:  (patient reports numbness in R LE) Extremity Assessment RUE Assessment RUE Assessment: Within Functional Limits LUE Assessment LUE Assessment: Within Functional Limits RLE Assessment RLE Assessment:  (not tested due to pt s/p R TKA) LLE Assessment LLE Assessment: Within Functional Limits Mobility (including Balance) Bed Mobility Bed Mobility: Yes Supine to Sit: 4: Min assist;With rails;HOB elevated (Comment degrees) Supine to Sit Details (indicate cue type and reason): minA for R LE management, verbal cues for hand placement Transfers Sit to Stand: 3: Mod assist;From bed Sit to Stand Details (indicate cue type and reason): verbal cues for UE placement and R LE placement Ambulation/Gait Ambulation/Gait: Yes Ambulation/Gait Assistance: 4: Min assist (contact guard) Ambulation/Gait Assistance Details (indicate cue type and reason): verbal cues for  sequencing Ambulation Distance (Feet): 150 Feet Assistive device: Rolling walker Gait Pattern: Step-through pattern Gait velocity: decreased cadence Stairs: No Wheelchair Mobility Wheelchair Mobility: No  Posture/Postural Control Posture/Postural Control: No significant limitations Balance Balance Assessed: No Exercise  Total Joint Exercises Ankle Circles/Pumps: AROM;Both;10 reps;Supine Quad Sets: AROM;Right;10 reps;Supine Heel Slides: AROM;Right;10 reps;Supine End of Session  PT - End of Session Equipment Utilized During Treatment: Gait belt;Right knee immobilizer Activity Tolerance: Patient tolerated treatment well Patient left: in chair;with call bell in reach Nurse Communication: Mobility status for ambulation General Behavior During Session: [facility] for tasks performed Cognition: [facility] for tasks performed  Marcene Brawn 06/08/2011, 12:24 PM  Lewis Shock, PT, DPT Pager #: [phone] Office #: [phone]

## 2011-06-09 LAB — CBC
HCT: 25.3 % — ABNORMAL LOW (ref 39.0–52.0)
Hemoglobin: 8.2 g/dL — ABNORMAL LOW (ref 13.0–17.0)
MCH: 22.6 pg — ABNORMAL LOW (ref 26.0–34.0)
MCHC: 32.4 g/dL (ref 30.0–36.0)
MCV: 69.7 fL — ABNORMAL LOW (ref 78.0–100.0)
Platelets: 292 K/uL (ref 150–400)
RBC: 3.63 MIL/uL — ABNORMAL LOW (ref 4.22–5.81)
RDW: 15 % (ref 11.5–15.5)
WBC: 9.2 K/uL (ref 4.0–10.5)

## 2011-06-09 NOTE — Progress Notes (Signed)
PT Treatment Note   06/09/11 1431  PT Visit Information  Last PT Received On 06/09/11  Precautions  Precautions Knee  Restrictions  RLE Weight Bearing WBAT  Bed Mobility  Sit to Supine 4: Min assist;With rail;HOB flat  Sit to Supine - Details (indicate cue type and reason) assist for R LE managment  Transfers  Sit to Stand 3: Mod assist  Sit to Stand Details (indicate cue type and reason) increased assist due to decreased surface heigth  Ambulation/Gait  Ambulation/Gait Assistance 4: Min assist  Ambulation/Gait Assistance Details (indicate cue type and reason) good sequencing  Ambulation Distance (Feet) 120 Feet  Assistive device Rolling walker  Gait Pattern Step-to pattern;Decreased step length - right;Decreased stance time - right  Gait velocity decreased  Stairs No  Wheelchair Mobility  Wheelchair Mobility No  Posture/Postural Control  Posture/Postural Control No significant limitations  Total Joint Exercises  Quad Sets AROM;Right;10 reps;Supine  Heel Slides AAROM;Right;10 reps;Seated (with towel beneath foot)  Short Arc Quad AAROM;Right;10 reps;Seated  PT - End of Session  Equipment Utilized During Treatment Gait belt  Activity Tolerance Patient tolerated treatment well  Patient left in bed;in CPM;with call bell in reach (cpm set 0-70, RN aware)  Nurse Communication (pt in CPM)  General  Behavior During Session [facility] for tasks performed  Cognition [facility] for tasks performed  PT - Assessment/Plan  Comments on Treatment Session Patient con't to have 4/10 pain and con't to progress towards all goals. Patient remains to have difficulty with sit --> stand transfers due to decreased UE strength. Patient to con't to benefit from skilled PT to maximize functional recovery.  PT Plan Discharge plan remains appropriate;Frequency remains appropriate  PT Frequency 7X/week  Follow Up Recommendations Skilled nursing facility  Acute Rehab PT Goals  PT Goal: Supine/Side to Sit - Progress  Progressing toward goal  PT Goal: Sit to Stand - Progress Progressing toward goal  PT Goal: Ambulate - Progress Progressing toward goal  PT Goal: Up/Down Stairs - Progress Progressing toward goal  PT Goal: Perform Home Exercise Program - Progress Progressing toward goal    PAIN:  4/10 R knee  Lewis Shock, PT, DPT Pager #: [phone] Office #: [phone]

## 2011-06-09 NOTE — Progress Notes (Signed)
Clinical Social Worker reviewed bed offers with pt and cousin.  Pt and family would like [employer].  CSW to continue to follow and assist as needed.   Angelia Mould, MSW, [place] [phone]

## 2011-06-09 NOTE — Progress Notes (Signed)
PT Treatment Note   06/09/11 1000  PT Visit Information  Last PT Received On 06/09/11  Precautions  Precautions Knee  Required Braces or Orthoses Yes  Restrictions  RLE Weight Bearing WBAT  Bed Mobility  Sit to Supine 4: Min assist  Sit to Supine - Details (indicate cue type and reason) assist for R LE managment  Transfers  Sit to Stand 3: Mod assist  Sit to Stand Details (indicate cue type and reason) increased assist due to decreased UE strength from RA  Ambulation/Gait  Ambulation/Gait Assistance 4: Min assist  Ambulation/Gait Assistance Details (indicate cue type and reason) cues to maintain R quad set, no episodes of buckling during ambulation without R KI  Ambulation Distance (Feet) 150 Feet  Assistive device Rolling walker  Gait Pattern Step-to pattern;Decreased step length - right;Decreased stance time - right  Gait velocity decreased  Stairs No  Wheelchair Mobility  Wheelchair Mobility No  Posture/Postural Control  Posture/Postural Control No significant limitations  Total Joint Exercises  Ankle Circles/Pumps AROM;Both;10 reps;Supine  Quad Sets AROM;Right;10 reps;Supine  Heel Slides AAROM;Right;10 reps;Supine  Hip ABduction/ADduction AROM;Right;10 reps;Supine  Straight Leg Raises AROM;Right;10 reps;Supine  PT - End of Session  Equipment Utilized During Treatment Gait belt  Activity Tolerance Patient tolerated treatment well  Patient left in chair;with call bell in reach  General  Behavior During Session [facility] for tasks performed  Cognition [facility] for tasks performed  PT - Assessment/Plan  Comments on Treatment Session Patient con't to progress towards all goals. Patient remains to have decreased R LE strength and R knee ROM due to pain.  PT Plan Discharge plan remains appropriate;Frequency remains appropriate  PT Frequency 7X/week  Follow Up Recommendations Skilled nursing facility  Acute Rehab PT Goals  PT Goal: Supine/Side to Sit - Progress Progressing toward goal   PT Goal: Sit to Stand - Progress Progressing toward goal  PT Goal: Ambulate - Progress Progressing toward goal  PT Goal: Up/Down Stairs - Progress Progressing toward goal  PT Goal: Perform Home Exercise Program - Progress Progressing toward goal    Jonathan [name], PT, DPT Pager #: [phone] Office #: [phone]

## 2011-06-09 NOTE — Progress Notes (Signed)
Subjective: 2 Days Post-Op Procedure(s) (LRB): COMPUTER ASSISTED TOTAL KNEE ARTHROPLASTY (Right) Patient reports pain as mild.  Hgb 8.2. Acute blood loss anemia but asymptomatic.  Wishes to be discharged to ST-SNF if doing well tomorrow.  Working well with PT.  Objective: Vital signs in last 24 hours: Temp:  [99 F (37.2 C)-102.2 F (39 C)] 99 F (37.2 C) (02/14 0445) Pulse Rate:  [96-130] 96  (02/14 0445) Resp:  [16-20] 20  (02/14 0445) BP: (129-133)/(69-81) 129/77 mmHg (02/14 0445) SpO2:  [93 %-100 %] 99 % (02/14 0445) Weight:  [79.7 kg (175 lb 11.3 oz)] 79.7 kg (175 lb 11.3 oz) (02/13 1439)  Intake/Output from previous day: 02/13 0701 - 02/14 0700 In: 2070 [P.O.:1320; I.V.:750] Out: 3000 [Urine:3000] Intake/Output this shift:     Basename 06/09/11 0505 06/08/11 0545  HGB 8.2* 8.5*    Basename 06/09/11 0505 06/08/11 0545  WBC 9.2 7.1  RBC 3.63* 3.68*  HCT 25.3* 25.8*  PLT 292 335    Basename 06/08/11 0545  NA 133*  K 3.7  CL 101  CO2 24  BUN 8  CREATININE 0.72  GLUCOSE 139*  CALCIUM 9.3   No results found for this basename: LABPT:2,INR:2 in the last 72 hours  Sensation intact distally Intact pulses distally Dorsiflexion/Plantar flexion intact Incision: scant drainage Compartment soft  Assessment/Plan: 2 Days Post-Op Procedure(s) (LRB): COMPUTER ASSISTED TOTAL KNEE ARTHROPLASTY (Right) Discharge to SNF tomorrow if clinically stable.  Kathryne Hitch 06/09/2011, 7:43 AM

## 2011-06-10 LAB — CBC
HCT: 24.3 % — ABNORMAL LOW (ref 39.0–52.0)
MCHC: 32.9 g/dL (ref 30.0–36.0)
RDW: 14.9 % (ref 11.5–15.5)

## 2011-06-10 MED ORDER — METHOCARBAMOL 500 MG PO TABS: 500.0000 mg | ORAL_TABLET | Freq: Four times a day (QID) | ORAL | Status: AC | PRN

## 2011-06-10 MED ORDER — OXYCODONE-ACETAMINOPHEN 5-325 MG PO TABS: 1.0000 | ORAL_TABLET | ORAL | Status: AC | PRN

## 2011-06-10 MED ORDER — RIVAROXABAN 10 MG PO TABS: 10.0000 mg | ORAL_TABLET | Freq: Every day | ORAL | Status: DC

## 2011-06-10 NOTE — Progress Notes (Signed)
Clinical Social Worker submitted appropriate paperwork to SNF.  CSW spoke with RN, who indicated everything was in place to call transport.  CSW phoned transport.  CSW to sign off at dc.  Angelia Mould, MSW, [place] [phone]

## 2011-06-10 NOTE — Discharge Summary (Signed)
Patient ID: Jonathan [name] MRN: [id number] DOB/AGE: [birth year] 50 y.o.  Admit date: 06/07/2011 Discharge date: 06/10/2011  Admission Diagnoses:  Principal Problem:  *Degenerative arthritis of knee   Discharge Diagnoses:  Same  Past Medical History  Diagnosis Date  . Arthritis     rheumatoid  . Hypercholesterolemia     Surgeries: Procedure(s): COMPUTER ASSISTED TOTAL KNEE ARTHROPLASTY on 06/07/2011   Consultants:    Discharged Condition: Improved  Hospital Course: Jonathan [name] is an 51 y.o. male who was admitted 06/07/2011 for operative treatment ofDegenerative arthritis of knee. Patient has severe unremitting pain that affects sleep, daily activities, and work/hobbies. After pre-op clearance the patient was taken to the operating room on 06/07/2011 and underwent  Procedure(s): COMPUTER ASSISTED TOTAL KNEE ARTHROPLASTY.    Patient was given perioperative antibiotics: Anti-infectives     Start     Dose/Rate Route Frequency Ordered Stop   06/07/11 1900   ceFAZolin (ANCEF) IVPB 1 g/50 mL premix        1 g 100 mL/hr over 30 Minutes Intravenous Every 6 hours 06/07/11 1808 05-Jul-2011 0721   06/06/11 1500   ceFAZolin (ANCEF) IVPB 2 g/50 mL premix        2 g 100 mL/hr over 30 Minutes Intravenous 60 min pre-op 06/06/11 1450 06/07/11 1249           Patient was given sequential compression devices, early ambulation, and chemoprophylaxis to prevent DVT.  Patient benefited maximally from hospital stay and there were no complications.    Recent vital signs: Patient Vitals for the past 24 hrs:  BP Temp Pulse Resp SpO2  06/10/11 0531 130/72 mmHg 98.4 F (36.9 C) 103  18  98 %  06/09/11 2330 - 99.4 F (37.4 C) - - -  06/09/11 2152 134/81 mmHg 101.1 F (38.4 C) 120  20  97 %  06/09/11 1321 119/70 mmHg 98.7 F (37.1 C) 106  18  95 %  06/09/11 0800 - 98.9 F (37.2 C) - 18  96 %     Recent laboratory studies:  Basename 06/09/11 0505 July 05, 2011 0545  WBC 9.2 7.1  HGB 8.2*  8.5*  HCT 25.3* 25.8*  PLT 292 335  NA -- 133*  K -- 3.7  CL -- 101  CO2 -- 24  BUN -- 8  CREATININE -- 0.72  GLUCOSE -- 139*  INR -- --  CALCIUM -- 9.3     Discharge Medications:   Medication List  As of 06/10/2011  6:51 AM   STOP taking these medications         HYDROcodone-acetaminophen 5-325 MG per tablet         TAKE these medications         atorvastatin 10 MG tablet   Commonly known as: LIPITOR   Take 10 mg by mouth daily.      methocarbamol 500 MG tablet   Commonly known as: ROBAXIN   Take 1 tablet (500 mg total) by mouth every 6 (six) hours as needed.      methotrexate 2.5 MG tablet   Commonly known as: RHEUMATREX   Take 15 mg by mouth once a week. Caution:Chemotherapy. Protect from light.      oxyCODONE-acetaminophen 5-325 MG per tablet   Commonly known as: PERCOCET   Take 1 tablet by mouth every 4 (four) hours as needed for pain.      rivaroxaban 10 MG Tabs tablet   Commonly known as: XARELTO   Take 1 tablet (10 mg total) by  mouth daily with breakfast.            Diagnostic Studies: Dg Chest 2 View  05/31/2011  *RADIOLOGY REPORT*  Clinical Data: Preoperative for right total knee arthroplasty. Former smoker.  Hypercholesterolemia.  CHEST - 2 VIEW  Comparison: None.  Findings: Cardiac and mediastinal contours appear normal.  The lungs appear clear.  No pleural effusion is identified.  IMPRESSION:  No significant abnormality identified.  Original Report Authenticated By: Dellia Cloud, M.D.   X-ray Knee Right Port  06/07/2011  *RADIOLOGY REPORT*  Clinical Data: Post right total knee replacement  PORTABLE RIGHT KNEE - 1-2 VIEW  Comparison: Portable exam 1556 hours without priors for comparison  Findings: Components of right knee prosthesis in expected positions. No fracture or acute complication identified. Surgical drain and anterior soft tissue changes noted. Bones appear mildly demineralized.  IMPRESSION: Right knee prosthesis without acute  complication.  Original Report Authenticated By: Lollie Marrow, M.D.    Disposition: To skilled nursing facility      Signed: Kathryne Hitch 06/10/2011, 6:51 AM

## 2011-06-10 NOTE — Progress Notes (Signed)
Physical Therapy Treatment Note   06/10/11 1417  PT Visit Information  Last PT Received On 06/10/11  Precautions  Precautions Knee  Restrictions  RLE Weight Bearing WBAT  Transfers  Sit to Stand 3: Mod assist  Sit to Stand Details (indicate cue type and reason) assist required due to decreased UE strength  Ambulation/Gait  Ambulation/Gait Assistance 4: Min assist (contact guard)  Ambulation/Gait Assistance Details (indicate cue type and reason) limited  Ambulation Distance (Feet) 160 Feet  Assistive device Rolling walker  Gait Pattern Step-to pattern;Decreased step length - right;Decreased stance time - right  Gait velocity improved since this AM  Stairs No  Wheelchair Mobility  Wheelchair Mobility No  Posture/Postural Control  Posture/Postural Control No significant limitations  Total Joint Exercises  Quad Sets AROM;Right;10 reps;Seated (with LEs elevated)  Heel Slides Right;10 reps;AAROM;Seated (achieve approx 80 degrees of knee flexion)  Short Arc Quad AAROM;Right;10 reps;Strengthening;Seated (with LEs elevated)  Knee Flexion AROM;Right;10 reps;Seated (with LEs elevated)  PT - End of Session  Equipment Utilized During Treatment Gait belt  Activity Tolerance Patient tolerated treatment well  Patient left in chair;with call bell in reach  General  Behavior During Session [facility] for tasks performed  Cognition [facility] for tasks performed  PT - Assessment/Plan  Comments on Treatment Session Patient with improved ambulation tolerance this date. per RN pt to d/c to SNF this date later this PM.  PT Plan Discharge plan remains appropriate;Frequency remains appropriate  PT Frequency 7X/week  Follow Up Recommendations Skilled nursing facility  Acute Rehab PT Goals  PT Goal: Supine/Side to Sit - Progress Progressing toward goal  PT Goal: Sit to Stand - Progress Progressing toward goal  PT Goal: Ambulate - Progress Progressing toward goal  PT Goal: Up/Down Stairs - Progress  Progressing toward goal  PT Goal: Perform Home Exercise Program - Progress Progressing toward goal    Pain: 5/10 R knee, RN provided pain medication.   Lewis Shock, PT, DPT Pager #: [phone] Office #: [phone]

## 2011-06-10 NOTE — Progress Notes (Signed)
Physical Therapy Treatment Note   06/10/11 1000  PT Visit Information  Last PT Received On 06/10/11  Precautions  Precautions Knee  Restrictions  RLE Weight Bearing WBAT  Bed Mobility  Supine to Sit 5: Supervision  Supine to Sit Details (indicate cue type and reason) increased time  Transfers  Sit to Stand 3: Mod assist;From bed;With upper extremity assist  Sit to Stand Details (indicate cue type and reason) pt remains to require increased assist due to decreased UE strength and decreased L LE strength from previous TKA  Ambulation/Gait  Ambulation/Gait Assistance 4: Min assist (contact guard)  Ambulation/Gait Assistance Details (indicate cue type and reason) limited by fatigue  Ambulation Distance (Feet) 120 Feet  Assistive device Rolling walker  Gait Pattern Step-to pattern;Decreased step length - right;Decreased stance time - right  Gait velocity decreased  Stairs No  Wheelchair Mobility  Wheelchair Mobility No  Posture/Postural Control  Posture/Postural Control No significant limitations  Total Joint Exercises  Ankle Circles/Pumps AROM;Right;10 reps;Seated  Quad Sets AROM;Right;10 reps;Supine  Heel Slides AROM;Right;10 reps;Seated (with towel beneath foot, approx 75 deg knee flex)  Short Arc Quad AAROM;Right;10 reps;Supine  Knee Flexion AROM;Right;10 reps;Supine  PT - End of Session  Equipment Utilized During Treatment Gait belt  Activity Tolerance Patient tolerated treatment well  Patient left in chair;with call bell in reach  Nurse Communication (tech notified of pt request for sponge bath)  General  Behavior During Session [facility] for tasks performed  Cognition [facility] for tasks performed  PT - Assessment/Plan  Comments on Treatment Session Patient remains to have decreased R LE strength and active R knee ROM however has improved since initial eval. Patient remains to require assist for sit --> stand transfer due to decreased UE strength. Patient to con't to benefit from  SNF to maximize functional recovery for safe transition home.  PT Plan Discharge plan remains appropriate;Frequency remains appropriate  PT Frequency 7X/week  Follow Up Recommendations Skilled nursing facility  Equipment Recommended Defer to next venue  Acute Rehab PT Goals  PT Goal: Supine/Side to Sit - Progress Progressing toward goal  PT Goal: Sit to Stand - Progress Progressing toward goal  PT Goal: Ambulate - Progress Progressing toward goal  PT Goal: Up/Down Stairs - Progress Progressing toward goal  PT Goal: Perform Home Exercise Program - Progress Progressing toward goal     Pain: 7/10 R knee, RN notified of patients desire for pain medication  Lewis Shock, PT, DPT Pager #: [phone] Office #: [phone]

## 2011-06-13 LAB — TYPE AND SCREEN
ABO/RH(D): B POS
Antibody Screen: POSITIVE
DAT, IgG: NEGATIVE

## 2011-07-12 ENCOUNTER — Ambulatory Visit: Payer: Medicare Other | Attending: Orthopaedic Surgery | Admitting: Rehabilitation

## 2011-07-12 DIAGNOSIS — M25569 Pain in unspecified knee: Secondary | ICD-10-CM | POA: Insufficient documentation

## 2011-07-12 DIAGNOSIS — IMO0001 Reserved for inherently not codable concepts without codable children: Secondary | ICD-10-CM | POA: Insufficient documentation

## 2011-07-12 DIAGNOSIS — R262 Difficulty in walking, not elsewhere classified: Secondary | ICD-10-CM | POA: Insufficient documentation

## 2011-07-12 DIAGNOSIS — M25669 Stiffness of unspecified knee, not elsewhere classified: Secondary | ICD-10-CM | POA: Insufficient documentation

## 2011-07-13 ENCOUNTER — Other Ambulatory Visit (HOSPITAL_COMMUNITY): Payer: Self-pay | Admitting: Orthopaedic Surgery

## 2011-07-14 ENCOUNTER — Encounter (HOSPITAL_COMMUNITY): Payer: Self-pay | Admitting: Pharmacy Technician

## 2011-07-15 ENCOUNTER — Encounter (HOSPITAL_COMMUNITY)
Admission: RE | Admit: 2011-07-15 | Discharge: 2011-07-15 | Disposition: A | Discharge status: Home / Self Care | Payer: Medicare Other | Source: Ambulatory Visit | Attending: Orthopaedic Surgery | Admitting: Orthopaedic Surgery

## 2011-07-15 ENCOUNTER — Encounter (HOSPITAL_COMMUNITY): Payer: Self-pay

## 2011-07-15 HISTORY — DX: Anemia, unspecified: D64.9

## 2011-07-15 LAB — CBC
MCH: 22 pg — ABNORMAL LOW (ref 26.0–34.0)
MCHC: 31.9 g/dL (ref 30.0–36.0)
MCV: 69 fL — ABNORMAL LOW (ref 78.0–100.0)
Platelets: 360 10*3/uL (ref 150–400)
RDW: 16.4 % — ABNORMAL HIGH (ref 11.5–15.5)

## 2011-07-15 LAB — SURGICAL PCR SCREEN
MRSA, PCR: NEGATIVE
Staphylococcus aureus: NEGATIVE

## 2011-07-15 LAB — COMPREHENSIVE METABOLIC PANEL
AST: 31 U/L (ref 0–37)
Albumin: 3.5 g/dL (ref 3.5–5.2)
Alkaline Phosphatase: 106 U/L (ref 39–117)
BUN: 7 mg/dL (ref 6–23)
Chloride: 98 mEq/L (ref 96–112)
Potassium: 3.8 mEq/L (ref 3.5–5.1)
Total Bilirubin: 0.3 mg/dL (ref 0.3–1.2)

## 2011-07-15 NOTE — Pre-Procedure Instructions (Signed)
20 Jonathan [name]  07/15/2011   Your procedure is scheduled on:  Tuesday, March 26th.  Report to [facility] at 1:00pm.  Call this number if you have problems the morning of surgery: [phone]   Remember:   Do not eat food:After Midnight.  May have clear liquids: up to 4 Hours before arrival.  Clear liquids include soda, tea, black coffee, apple or grape juice, broth.   Take these medicines the morning of surgery with A SIP OF WATER: NA   Do not wear jewelry, make-up or nail polish.  Do not wear lotions, powders, or perfumes. You may wear deodorant.  Do not shave 48 hours prior to surgery.  Do not bring valuables to the hospital.  Contacts, dentures or bridgework may not be worn into surgery.  Leave suitcase in the car. After surgery it may be brought to your room.  For patients admitted to the hospital, checkout time is 11:00 AM the day of discharge.   Patients discharged the day of surgery will not be allowed to drive home.  Name and phone number of your driver: --  Special Instructions: CHG Shower Use Special Wash: 1/2 bottle night before surgery and 1/2 bottle morning of surgery.   Please read over the following fact sheets that you were given: Pain Booklet, Coughing and Deep Breathing, MRSA Information and Surgical Site Infection Prevention

## 2011-07-19 ENCOUNTER — Ambulatory Visit: Payer: Medicare Other | Admitting: Rehabilitation

## 2011-07-19 ENCOUNTER — Ambulatory Visit (HOSPITAL_COMMUNITY)
Admission: RE | Admit: 2011-07-19 | Discharge: 2011-07-19 | Disposition: A | Discharge status: Home / Self Care | Payer: Medicare Other | Source: Ambulatory Visit | Attending: Orthopaedic Surgery | Admitting: Orthopaedic Surgery

## 2011-07-19 ENCOUNTER — Ambulatory Visit (HOSPITAL_COMMUNITY): Payer: Medicare Other | Admitting: Anesthesiology

## 2011-07-19 ENCOUNTER — Encounter (HOSPITAL_COMMUNITY)
Admission: RE | Disposition: A | Discharge status: Home / Self Care | Payer: Self-pay | Source: Ambulatory Visit | Attending: Orthopaedic Surgery

## 2011-07-19 ENCOUNTER — Encounter (HOSPITAL_COMMUNITY): Payer: Self-pay | Admitting: Anesthesiology

## 2011-07-19 DIAGNOSIS — M24669 Ankylosis, unspecified knee: Secondary | ICD-10-CM

## 2011-07-19 DIAGNOSIS — E7800 Pure hypercholesterolemia, unspecified: Secondary | ICD-10-CM | POA: Insufficient documentation

## 2011-07-19 DIAGNOSIS — M1710 Unilateral primary osteoarthritis, unspecified knee: Secondary | ICD-10-CM

## 2011-07-19 DIAGNOSIS — Z96659 Presence of unspecified artificial knee joint: Secondary | ICD-10-CM | POA: Insufficient documentation

## 2011-07-19 DIAGNOSIS — M25669 Stiffness of unspecified knee, not elsewhere classified: Secondary | ICD-10-CM | POA: Insufficient documentation

## 2011-07-19 DIAGNOSIS — D649 Anemia, unspecified: Secondary | ICD-10-CM | POA: Insufficient documentation

## 2011-07-19 HISTORY — PX: KNEE CLOSED REDUCTION: SHX995

## 2011-07-19 SURGERY — MANIPULATION, KNEE, CLOSED
Anesthesia: General | Site: Knee | Laterality: Right | Wound class: Clean

## 2011-07-19 MED ORDER — BUPIVACAINE HCL 0.5 % IJ SOLN
INTRAMUSCULAR | Status: DC | PRN
  Administered 2011-07-19: 4 mL

## 2011-07-19 MED ORDER — FENTANYL CITRATE 0.05 MG/ML IJ SOLN
INTRAMUSCULAR | Status: DC | PRN
  Administered 2011-07-19: 100 ug via INTRAVENOUS

## 2011-07-19 MED ORDER — HYDROMORPHONE HCL PF 1 MG/ML IJ SOLN
0.2500 mg | INTRAMUSCULAR | Status: DC | PRN
  Administered 2011-07-19 (×2): 0.25 mg via INTRAVENOUS

## 2011-07-19 MED ORDER — METHYLPREDNISOLONE ACETATE PF 40 MG/ML IJ SUSP
INTRAMUSCULAR | Status: DC | PRN
  Administered 2011-07-19: 1 mL via INTRA_ARTICULAR

## 2011-07-19 MED ORDER — MIDAZOLAM HCL 5 MG/5ML IJ SOLN
INTRAMUSCULAR | Status: DC | PRN
  Administered 2011-07-19: 2 mg via INTRAVENOUS

## 2011-07-19 MED ORDER — ONDANSETRON HCL 4 MG/2ML IJ SOLN: 4.0000 mg | Freq: Once | INTRAMUSCULAR | Status: DC | PRN

## 2011-07-19 MED ORDER — MORPHINE SULFATE 2 MG/ML IJ SOLN: 0.0500 mg/kg | INTRAMUSCULAR | Status: DC | PRN

## 2011-07-19 MED ORDER — ONDANSETRON HCL 4 MG/2ML IJ SOLN
INTRAMUSCULAR | Status: DC | PRN
  Administered 2011-07-19: 4 mg via INTRAVENOUS

## 2011-07-19 MED ORDER — LACTATED RINGERS IV SOLN
INTRAVENOUS | Status: DC
  Administered 2011-07-19: 14:00:00 via INTRAVENOUS

## 2011-07-19 MED ORDER — PROPOFOL 10 MG/ML IV EMUL
INTRAVENOUS | Status: DC | PRN
  Administered 2011-07-19: 200 mL via INTRAVENOUS

## 2011-07-19 MED ORDER — OXYCODONE-ACETAMINOPHEN 5-325 MG PO TABS: 1.0000 | ORAL_TABLET | ORAL | Status: AC | PRN

## 2011-07-19 SURGICAL SUPPLY — 10 items
CLOTH BEACON ORANGE TIMEOUT ST (SAFETY) ×2 IMPLANT
GLOVE BIOGEL M 8.0 STRL (GLOVE) ×2 IMPLANT
GLOVE BIOGEL PI IND STRL 8 (GLOVE) ×1 IMPLANT
GLOVE BIOGEL PI INDICATOR 8 (GLOVE) ×1
GOWN PREVENTION PLUS LG XLONG (DISPOSABLE) IMPLANT
KIT ROOM TURNOVER OR (KITS) ×2 IMPLANT
NEEDLE 18GX1X1/2 (RX/OR ONLY) (NEEDLE) ×2 IMPLANT
NEEDLE 22X1 1/2 (OR ONLY) (NEEDLE) ×2 IMPLANT
PAD ARMBOARD 7.5X6 YLW CONV (MISCELLANEOUS) ×4 IMPLANT
SYR CONTROL 10ML LL (SYRINGE) ×2 IMPLANT

## 2011-07-19 NOTE — Preoperative (Signed)
Beta Blockers   Reason not to administer Beta Blockers:Not Applicable 

## 2011-07-19 NOTE — Progress Notes (Signed)
Notified Dr.Crews of pt's hgb. No new orders.

## 2011-07-19 NOTE — Transfer of Care (Signed)
Immediate Anesthesia Transfer of Care Note  Patient: Jonathan [name]  Procedure(s) Performed: Procedure(s) (LRB): CLOSED MANIPULATION KNEE (Right)  Patient Location: PACU  Anesthesia Type: General  Level of Consciousness: awake, alert  and oriented  Airway & Oxygen Therapy: Patient Spontanous Breathing and Patient connected to nasal cannula oxygen  Post-op Assessment: Report given to PACU RN  Post vital signs: Reviewed and stable  Complications: No apparent anesthesia complications

## 2011-07-19 NOTE — Brief Op Note (Signed)
07/19/2011  3:17 PM  PATIENT:  Junie Bame  51 y.o. male  PRE-OPERATIVE DIAGNOSIS:  Severe arthrofibrosis right total knee  POST-OPERATIVE DIAGNOSIS:  Severe arthrofibrosis right total knee  PROCEDURE:  Procedure(s) (LRB): CLOSED MANIPULATION KNEE (Right)  SURGEON:  Surgeon(s) and Role:    * Kathryne Hitch, MD - Primary  PHYSICIAN ASSISTANT:   ASSISTANTS: none   ANESTHESIA:   local and general   BLOOD ADMINISTERED:none  DRAINS: none   LOCAL MEDICATIONS USED:  NONE  SPECIMEN:  No Specimen  DISPOSITION OF SPECIMEN:  N/A  COUNTS:  YES  TOURNIQUET:  * No tourniquets in log *  DICTATION: .Other Dictation: Dictation Number [id number]  PLAN OF CARE: Discharge to home after PACU  PATIENT DISPOSITION:  PACU - hemodynamically stable.   Delay start of Pharmacological VTE agent (>24hrs) due to surgical blood loss or risk of bleeding: not applicable

## 2011-07-19 NOTE — Anesthesia Preprocedure Evaluation (Signed)
Anesthesia Evaluation  Patient identified by MRN, date of birth, ID band Patient awake    Reviewed: Allergy & Precautions, H&P , NPO status , Patient's Chart, lab work & pertinent test results  Airway Mallampati: II TM Distance: >3 FB   Mouth opening: Limited Mouth Opening  Dental  (+) Teeth Intact, Poor Dentition and Dental Advisory Given   Pulmonary  breath sounds clear to auscultation        Cardiovascular Rhythm:Regular Rate:Normal     Neuro/Psych    GI/Hepatic   Endo/Other    Renal/GU      Musculoskeletal   Abdominal   Peds  Hematology   Anesthesia Other Findings   Reproductive/Obstetrics                           Anesthesia Physical Anesthesia Plan  ASA: II  Anesthesia Plan: General   Post-op Pain Management:    Induction: Intravenous  Airway Management Planned: LMA  Additional Equipment:   Intra-op Plan:   Post-operative Plan: Extubation in OR  Informed Consent: I have reviewed the patients History and Physical, chart, labs and discussed the procedure including the risks, benefits and alternatives for the proposed anesthesia with the patient or authorized representative who has indicated his/her understanding and acceptance.   Dental advisory given  Plan Discussed with: CRNA, Anesthesiologist and Surgeon  Anesthesia Plan Comments:         Anesthesia Quick Evaluation

## 2011-07-19 NOTE — Discharge Instructions (Signed)
Instructions Following General Anesthetic, Adult A nurse specialized in giving anesthesia (anesthetist) or a doctor specialized in giving anesthesia (anesthesiologist) gave you a medicine that made you sleep while a procedure was performed. For as long as 24 hours following this procedure, you may feel:  Dizzy.   Weak.   Drowsy.  AFTER THE PROCEDURE After surgery, you will be taken to the recovery area where a nurse will monitor your progress. You will be allowed to go home when you are awake, stable, taking fluids well, and without complications. For the first 24 hours following an anesthetic:  Have a responsible person with you.   Do not drive a car. If you are alone, do not take public transportation.   Do not drink alcohol.   Do not take medicine that has not been prescribed by your caregiver.   Do not sign important papers or make important decisions.   You may resume normal diet and activities as directed.   Change bandages (dressings) as directed.   Only take over-the-counter or prescription medicines for pain, discomfort, or fever as directed by your caregiver.  If you have questions or problems that seem related to the anesthetic, call the hospital and ask for the anesthetist or anesthesiologist on call. SEEK IMMEDIATE MEDICAL CARE IF:   You develop a rash.   You have difficulty breathing.   You have chest pain.   You develop any allergic problems.  Document Released: 07/18/2000 Document Revised: 03/31/2011 Document Reviewed: 02/26/2007 Exit[employer] Patient Information 2012 [employer]. 

## 2011-07-19 NOTE — Anesthesia Procedure Notes (Signed)
Procedure Name: LMA Insertion Date/Time: 07/19/2011 3:04 PM Performed by: Jefm Miles E Pre-anesthesia Checklist: Patient identified, Emergency Drugs available, Suction available, Patient being monitored and Timeout performed Patient Re-evaluated:Patient Re-evaluated prior to inductionOxygen Delivery Method: Circle system utilized Preoxygenation: Pre-oxygenation with 100% oxygen Intubation Type: IV induction Ventilation: Mask ventilation without difficulty LMA: LMA inserted LMA Size: 4.0 Number of attempts: 1 Placement Confirmation: ETT inserted through vocal cords under direct vision,  breath sounds checked- equal and bilateral and positive ETCO2 Tube secured with: Tape Dental Injury: Teeth and Oropharynx as per pre-operative assessment

## 2011-07-19 NOTE — H&P (Signed)
Jonathan [name] is an 51 y.o. male.   Chief Complaint:   Limited motion of right knee post total knee replacement HPI:   51 yo male post a right total knee replacement earlier this year.  He has been limited due to the pain from surgery and he has not pushed himself beyond this in therapy.  He knee motion is severely limited.  He needs at this point a manipulation under anesthesia.  He understands fully the risk and benefits.  Past Medical History  Diagnosis Date  . Arthritis     rheumatoid  . Hypercholesterolemia   . Anemia     Past Surgical History  Procedure Date  . Joint replacement     L TKA  . Knee arthroplasty 06/07/2011    Procedure: COMPUTER ASSISTED TOTAL KNEE ARTHROPLASTY;  Surgeon: Kathryne Hitch, MD;  Location[facility] OR;  Service: Orthopedics;  Laterality: Right;  Right total knee arthoplasty    Family History  Problem Relation Age of Onset  . Anesthesia problems Neg Hx    Social History:  reports that he quit smoking about 7 years ago. His smoking use included Cigarettes. He does not have any smokeless tobacco history on file. He reports that he does not drink alcohol or use illicit drugs.  Allergies: No Known Allergies  Medications Prior to Admission  Medication Dose Route Frequency Provider Last Rate Last Dose  . lactated ringers infusion   Intravenous Continuous Kerby Nora, MD 50 mL/hr at 07/19/11 1354     Medications Prior to Admission  Medication Sig Dispense Refill  . atorvastatin (LIPITOR) 10 MG tablet Take 10 mg by mouth daily.      . methotrexate (RHEUMATREX) 2.5 MG tablet Take 15 mg by mouth once a week. Mondays.  Caution:Chemotherapy. Protect from light.      . rivaroxaban (XARELTO) 10 MG TABS tablet Take 10 mg by mouth daily with breakfast.        No results found for this or any previous visit (from the past 48 hour(s)). No results found.  Review of Systems  All other systems reviewed and are negative.    Blood pressure 102/67, pulse  99, temperature 98.2 F (36.8 C), temperature source Oral, resp. rate 18, SpO2 98.00%. Physical Exam  Constitutional: He is oriented to person, place, and time. He appears well-developed and well-nourished.  HENT:  Head: Normocephalic and atraumatic.  Eyes: Pupils are equal, round, and reactive to light.  Neck: Normal range of motion. Neck supple.  Cardiovascular: Normal rate and regular rhythm.   Respiratory: Effort normal and breath sounds normal.  GI: Soft. Bowel sounds are normal.  Musculoskeletal:       Right knee: He exhibits decreased range of motion.  Neurological: He is alert and oriented to person, place, and time.  Skin: Skin is warm and dry.  Psychiatric: He has a normal mood and affect.     Assessment/Plan To the OR for a maniupulation of his right total knee joint under anesthesia  Leylah Tarnow Y 07/19/2011, 2:37 PM

## 2011-07-19 NOTE — Anesthesia Postprocedure Evaluation (Signed)
  Anesthesia Post-op Note  Patient: Antron Seth  Procedure(s) Performed: Procedure(s) (LRB): CLOSED MANIPULATION KNEE (Right)  Patient Location: PACU  Anesthesia Type: General  Level of Consciousness: awake, alert  and oriented  Airway and Oxygen Therapy: Patient Spontanous Breathing and Patient connected to nasal cannula oxygen  Post-op Pain: mild  Post-op Assessment: Post-op Vital signs reviewed, Patient's Cardiovascular Status Stable, Respiratory Function Stable, Patent Airway, No signs of Nausea or vomiting and Pain level controlled  Post-op Vital Signs: Reviewed  Complications: No apparent anesthesia complications

## 2011-07-20 ENCOUNTER — Encounter (HOSPITAL_COMMUNITY): Payer: Self-pay | Admitting: Orthopaedic Surgery

## 2011-07-20 NOTE — Op Note (Signed)
NAME:  CURRIE, DENNIN NO.:  [id number]  MEDICAL RECORD NO.:  [id number]  LOCATION:  [facility]                         FACILITY:  [facility]  PHYSICIAN:  Vanita Panda. Magnus Ivan, M.D.DATE OF BIRTH:  [birth year]  DATE OF PROCEDURE:  07/19/2011 DATE OF DISCHARGE:  07/19/2011                              OPERATIVE REPORT   PREOPERATIVE DIAGNOSIS:  Arthrofibrosis, right total knee arthroplasty.  POSTOPERATIVE DIAGNOSIS:  Arthrofibrosis, right total knee arthroplasty.  PROCEDURE:  Manipulation under anesthesia of right total knee arthroplasty.  ANESTHESIA: 1. General. 2. Local with 4 mL of Marcaine and 1 mL of Depo-Medrol.  BLOOD LOSS:  Not applicable.  COMPLICATIONS:  None.  INDICATIONS:  Mr. 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  He then did not participate in physical therapy much at all secondary to his pain.  I saw him on the office yesterday and his range of motion was -10 degrees to only 60 degrees.  I recommend he undergo manipulation under anesthesia with agreement that he would get an aggressive physical therapy and a CPM.  After this, he agreed to this and does wish to proceed with surgery.  PROCEDURE DESCRIPTION:  After informed consent was obtained, appropriate right knee was marked.  He was brought to the room and placed in supine on the operating table.  General anesthesia was then obtained.  A time- out was called and he identified the correct patient and correct right knee.  I then slowly manipulated the knee first in extension and was able to get on 3 degrees of full extension and flexion.  I was able to manipulate and I will release and could feel a scar tissue releasing and got him easily to 120 degrees.  This need through several cycles range of motion, then I cleaned the anterolateral aspect of his knee and placed injection of 4 mL of Marcaine and  1 mL of Depo-Medrol.  A Band- Aid was placed over this.  He was then awakened, extubated and taken to the recovery room in stable condition.  All final counts were correct. There were no complications noted.     Vanita Panda. Magnus Ivan, M.D.    CYB/MEDQ  D:  07/19/2011  T:  07/20/2011  Job:  [id number]

## 2011-07-21 ENCOUNTER — Ambulatory Visit: Payer: Medicare Other | Admitting: Rehabilitation

## 2011-07-25 ENCOUNTER — Ambulatory Visit: Payer: Medicare Other | Attending: Orthopaedic Surgery | Admitting: Rehabilitation

## 2011-07-25 DIAGNOSIS — R262 Difficulty in walking, not elsewhere classified: Secondary | ICD-10-CM | POA: Insufficient documentation

## 2011-07-25 DIAGNOSIS — M25669 Stiffness of unspecified knee, not elsewhere classified: Secondary | ICD-10-CM | POA: Insufficient documentation

## 2011-07-25 DIAGNOSIS — IMO0001 Reserved for inherently not codable concepts without codable children: Secondary | ICD-10-CM | POA: Insufficient documentation

## 2011-07-25 DIAGNOSIS — M25569 Pain in unspecified knee: Secondary | ICD-10-CM | POA: Insufficient documentation

## 2011-07-26 ENCOUNTER — Ambulatory Visit: Payer: Medicare Other | Admitting: Rehabilitation

## 2011-07-28 ENCOUNTER — Ambulatory Visit: Payer: Medicare Other | Admitting: Rehabilitation

## 2011-08-01 ENCOUNTER — Ambulatory Visit: Payer: Medicare Other | Admitting: Rehabilitation

## 2011-08-03 ENCOUNTER — Ambulatory Visit: Payer: Medicare Other | Admitting: Rehabilitation

## 2011-08-04 ENCOUNTER — Ambulatory Visit: Payer: Medicare Other | Admitting: Rehabilitation

## 2011-08-08 ENCOUNTER — Ambulatory Visit: Payer: Medicare Other | Admitting: Rehabilitation

## 2011-08-10 ENCOUNTER — Ambulatory Visit: Payer: Medicare Other | Admitting: Rehabilitation

## 2011-08-11 ENCOUNTER — Ambulatory Visit: Payer: Medicare Other | Admitting: Rehabilitation

## 2011-08-15 ENCOUNTER — Ambulatory Visit: Payer: Medicare Other | Admitting: Rehabilitation

## 2011-08-18 ENCOUNTER — Encounter: Payer: Medicare Other | Admitting: Rehabilitation

## 2011-08-24 ENCOUNTER — Encounter: Payer: Medicare Other | Admitting: Physical Therapy

## 2011-08-25 ENCOUNTER — Ambulatory Visit: Payer: Medicare Other | Attending: Orthopaedic Surgery | Admitting: Physical Therapy

## 2011-08-25 DIAGNOSIS — IMO0001 Reserved for inherently not codable concepts without codable children: Secondary | ICD-10-CM | POA: Insufficient documentation

## 2011-08-25 DIAGNOSIS — R262 Difficulty in walking, not elsewhere classified: Secondary | ICD-10-CM | POA: Insufficient documentation

## 2011-08-25 DIAGNOSIS — M25569 Pain in unspecified knee: Secondary | ICD-10-CM | POA: Insufficient documentation

## 2011-08-25 DIAGNOSIS — M25669 Stiffness of unspecified knee, not elsewhere classified: Secondary | ICD-10-CM | POA: Insufficient documentation

## 2011-08-29 ENCOUNTER — Ambulatory Visit: Payer: Medicare Other | Admitting: Physical Therapy

## 2011-08-30 ENCOUNTER — Ambulatory Visit: Payer: Medicare Other | Admitting: Rehabilitation

## 2011-09-01 ENCOUNTER — Ambulatory Visit: Payer: Medicare Other | Admitting: Rehabilitation

## 2011-09-07 ENCOUNTER — Ambulatory Visit: Payer: Medicare Other | Admitting: Physical Therapy

## 2011-09-13 ENCOUNTER — Ambulatory Visit: Payer: Medicare Other | Admitting: Rehabilitation

## 2011-09-15 ENCOUNTER — Ambulatory Visit: Payer: Medicare Other | Admitting: Rehabilitation

## 2011-09-20 ENCOUNTER — Ambulatory Visit: Payer: Medicare Other | Admitting: Rehabilitation

## 2011-09-22 ENCOUNTER — Ambulatory Visit: Payer: Medicare Other | Admitting: Rehabilitation

## 2011-09-26 ENCOUNTER — Ambulatory Visit: Payer: Medicare Other | Attending: Orthopaedic Surgery | Admitting: Physical Therapy

## 2011-09-26 DIAGNOSIS — M25669 Stiffness of unspecified knee, not elsewhere classified: Secondary | ICD-10-CM | POA: Insufficient documentation

## 2011-09-26 DIAGNOSIS — IMO0001 Reserved for inherently not codable concepts without codable children: Secondary | ICD-10-CM | POA: Insufficient documentation

## 2011-09-26 DIAGNOSIS — R262 Difficulty in walking, not elsewhere classified: Secondary | ICD-10-CM | POA: Insufficient documentation

## 2011-09-26 DIAGNOSIS — M25569 Pain in unspecified knee: Secondary | ICD-10-CM | POA: Insufficient documentation

## 2011-09-28 ENCOUNTER — Ambulatory Visit: Payer: Medicare Other | Admitting: Physical Therapy

## 2011-10-03 ENCOUNTER — Emergency Department (INDEPENDENT_AMBULATORY_CARE_PROVIDER_SITE_OTHER)
Admission: EM | Admit: 2011-10-03 | Discharge: 2011-10-03 | Disposition: A | Discharge status: Home / Self Care | Payer: Medicare Other | Source: Home / Self Care | Attending: Family Medicine | Admitting: Family Medicine

## 2011-10-03 ENCOUNTER — Encounter (HOSPITAL_COMMUNITY): Payer: Self-pay | Admitting: Cardiology

## 2011-10-03 DIAGNOSIS — R609 Edema, unspecified: Secondary | ICD-10-CM

## 2011-10-03 LAB — POCT I-STAT, CHEM 8
Calcium, Ion: 1.33 mmol/L — ABNORMAL HIGH (ref 1.12–1.32)
Glucose, Bld: 87 mg/dL (ref 70–99)
HCT: 31 % — ABNORMAL LOW (ref 39.0–52.0)
Hemoglobin: 10.5 g/dL — ABNORMAL LOW (ref 13.0–17.0)
Potassium: 3.6 mEq/L (ref 3.5–5.1)
TCO2: 24 mmol/L (ref 0–100)

## 2011-10-03 MED ORDER — FUROSEMIDE 20 MG PO TABS: 20.0000 mg | ORAL_TABLET | Freq: Every day | ORAL | Status: DC

## 2011-10-03 MED ORDER — HYDROCODONE-ACETAMINOPHEN 5-325 MG PO TABS: 2.0000 | ORAL_TABLET | ORAL | Status: AC | PRN

## 2011-10-03 NOTE — Discharge Instructions (Signed)
Peripheral Edema You have swelling in your legs (peripheral edema). This swelling is due to excess accumulation of salt and water in your body. Edema may be a sign of heart, kidney or liver disease, or a side effect of a medication. It may also be due to problems in the leg veins. Elevating your legs and using special support stockings may be very helpful, if the cause of the swelling is due to poor venous circulation. Avoid long periods of standing, whatever the cause. Treatment of edema depends on identifying the cause. Chips, pretzels, pickles and other salty foods should be avoided. Restricting salt in your diet is almost always needed. Water pills (diuretics) are often used to remove the excess salt and water from your body via urine. These medicines prevent the kidney from reabsorbing sodium. This increases urine flow. Diuretic treatment may also result in lowering of potassium levels in your body. Potassium supplements may be needed if you have to use diuretics daily. Daily weights can help you keep track of your progress in clearing your edema. You should call your caregiver for follow up care as recommended. SEEK IMMEDIATE MEDICAL CARE IF:   You have increased swelling, pain, redness, or heat in your legs.   You develop shortness of breath, especially when lying down.   You develop chest or abdominal pain, weakness, or fainting.   You have a fever.  Document Released: 05/19/2004 Document Revised: 03/31/2011 Document Reviewed: 04/29/2009 [facility] Patient Information 2012 [place], [place]. Go to www.goodrx.com to look up your medications. This will give you a list of where you can find your prescriptions at the most affordable prices.   Call [facility]  [phone]  If you have no primary doctor, here are some resources that may be helpful:  Medicaid-accepting [facility] Providers:   - [facility]- [address]      [phone]      Mon-Fri 9am-7pm,  Sat 9am-1pm   - [facility]- [address] [place], [place]      [phone]   - [facility]- [address], [address] [place]      [phone]   [facility]- [address]      [phone]   - Renaye Rakers- [address] [place], [address]      [phone]      Only accepts [place] patients       after they have her name applied to their card   Self Pay (no insurance) in [place]:   - Sickle Cell Patients: Dr Willey Blade, [facility]      [address] [place]      [phone]   - [facility] [phone]   - Physician Referral Service- [phone]   - [facility]- [address] [place]      [phone]   [facility] [place]- [address]   - [facility]- see information above      (Speak to [employer] if you do not have insurance)   - [facility]- [address] [place]      [phone]   - [facility] [place]- [address]      [phone]   - [facility]- [address]      [phone]   - Dr Julio Sicks-  [phone], [place]      [phone]   [facility]- [address]      [phone]   - [facility]- [address]      [phone]      Also [address]      [phone]   - [facility]- [address]      [phone]      1st and 3rd Saturday every month, 10am-1pm    Other agencies that provide inexpensive medical care:     [facility]  [phone]    [facility]  [phone]    [facility]  [phone]    [facility]  [phone] [address] [place] [address]    [facility]  [phone]    [facility]  [phone] [facility] [phone]   [address] [place], [place] [address]  Chronic Pain Problems Contact [facility]   [phone] Patients need to be referred by their primary care doctor.  [facility]  [facility]     [employer]                          [facility] [address] [place]                       [address]      [address] [place] [address]   [phone] (After Hours)  General Information: Finding a doctor when you do not have health insurance can be tricky. Although you are not limited by an insurance plan, you are of course limited by her finances and how much but he can pay out of pocket.  What are your options if you don't have health insurance?   1) Find a Librarian, academic and Pay Out of Pocket Although you won't have to find out who is covered by your insurance plan, it is a good idea to ask around and get recommendations. You will then need to call the office and see if the doctor you have chosen will accept you as a new patient and what types of options they offer for patients who are self-pay. Some doctors offer discounts or will set up payment plans for their patients who do not have insurance, but you will need to ask so you aren't surprised when you get to your appointment.  2) Contact Your Local Health Department Not all health departments have doctors that can see patients for sick visits, but many do, so it is worth a call to see if yours does. If you don't know where your local health department is, you can check in your phone book. The CDC also has a tool to help you locate your state's health department, and many state websites also have listings of all of their local health departments.  3) Find a Walk-in Clinic If your illness is not likely to be very severe or complicated, you may want to try a walk in clinic. These are popping up all over the country in pharmacies, drugstores, and shopping centers. They're usually staffed by nurse practitioners or physician assistants that have been trained to treat common illnesses and  complaints. They're usually fairly quick and inexpensive. However, if you have serious medical issues or chronic medical problems, these are probably not  your best option

## 2011-10-03 NOTE — ED Notes (Addendum)
Pt here with cousin. Pt has been following with [facility] and is in the middle of changing doctors. The pt has had previous bilat knee replacements and has had bilat lower extremity swelling and hand swelling for the past 3 weeks. Denies fever. Decreased appetite since last knee replacement in February.

## 2011-10-03 NOTE — ED Provider Notes (Signed)
History     CSN: [id number]  Arrival date & time 10/03/11  1612   First MD Initiated Contact with Patient 10/03/11 1705      Chief Complaint  Patient presents with  . Leg Swelling    (Consider location/radiation/quality/duration/timing/severity/associated sxs/prior treatment) Patient is a 51 y.o. male presenting with leg pain. The history is provided by the patient and a relative. No language interpreter was used.  Leg Pain  Incident onset: 3 weeks. The incident occurred at home. There was no injury mechanism. The pain is present in the left foot and right foot. The quality of the pain is described as aching. The pain is moderate. The pain has been constant since onset. Pertinent negatives include no inability to bear weight. He has tried nothing for the symptoms. The treatment provided no relief.    Past Medical History  Diagnosis Date  . Arthritis     rheumatoid  . Hypercholesterolemia   . Anemia     Past Surgical History  Procedure Date  . Knee arthroplasty 06/07/2011    Procedure: COMPUTER ASSISTED TOTAL KNEE ARTHROPLASTY;  Surgeon: Kathryne Hitch, MD;  Location[facility] OR;  Service: Orthopedics;  Laterality: Right;  Right total knee arthoplasty  . Knee closed reduction 07/19/2011    Procedure: CLOSED MANIPULATION KNEE;  Surgeon: Kathryne Hitch, MD;  Location: [facility] OR;  Service: Orthopedics;  Laterality: Right;  Manipulation under anesthesia right knee  . Joint replacement     L TKA  also right in 05/2011    Family History  Problem Relation Age of Onset  . Anesthesia problems Neg Hx     History  Substance Use Topics  . Smoking status: Former Smoker    Types: Cigarettes    Quit date: 04/25/2004  . Smokeless tobacco: Not on file  . Alcohol Use: No      Review of Systems  Musculoskeletal: Positive for myalgias and joint swelling.  All other systems reviewed and are negative.    Allergies  Review of patient's allergies indicates no known  allergies.  Home Medications   Current Outpatient Rx  Name Route Sig Dispense Refill  . OMEGA-3 FATTY ACIDS 1000 MG PO CAPS Oral Take 2 g by mouth daily.    Marland Kitchen METHOTREXATE 2.5 MG PO TABS Oral Take 15 mg by mouth once a week. Mondays.  Caution:Chemotherapy. Protect from light.    . ATORVASTATIN CALCIUM 10 MG PO TABS Oral Take 10 mg by mouth daily.      BP 122/76  Pulse 94  Temp(Src) 98.8 F (37.1 C) (Oral)  Resp 20  SpO2 100%  Physical Exam  Nursing note and vitals reviewed. Constitutional: He appears well-developed and well-nourished.  HENT:  Head: Normocephalic and atraumatic.  Musculoskeletal: He exhibits tenderness.       Edema bilat feet,  No swelling above ankles,    Neurological: He is alert.  Skin: Skin is warm.  Psychiatric: He has a normal mood and affect.    ED Course  Procedures (including critical care time)  Labs Reviewed  POCT I-STAT, CHEM 8 - Abnormal; Notable for the following:    Calcium, Ion 1.33 (*)    Hemoglobin 10.5 (*)    HCT 31.0 (*)    All other components within normal limits   No results found.   1. Edema       MDM  Pt has been seen by Dr. Concepcion Elk however he is in between doctors.  Pt complains of pain  Lonia Skinner [place], [place] 10/03/11 1850  Lonia Skinner [place], [place] 10/03/11 1850

## 2011-10-04 ENCOUNTER — Encounter: Payer: Medicare Other | Admitting: Rehabilitation

## 2011-10-05 ENCOUNTER — Encounter: Payer: Medicare Other | Admitting: Physical Therapy

## 2011-10-05 NOTE — ED Provider Notes (Signed)
Medical screening examination/treatment/procedure(s) were performed by non-physician practitioner and as supervising physician I was immediately available for consultation/collaboration.   [facility]; MD   Sharin Grave, MD 10/05/11 [phone]

## 2011-10-24 ENCOUNTER — Ambulatory Visit: Payer: Medicare Other | Attending: Orthopaedic Surgery

## 2011-10-24 DIAGNOSIS — R262 Difficulty in walking, not elsewhere classified: Secondary | ICD-10-CM | POA: Insufficient documentation

## 2011-10-24 DIAGNOSIS — M25569 Pain in unspecified knee: Secondary | ICD-10-CM | POA: Insufficient documentation

## 2011-10-24 DIAGNOSIS — IMO0001 Reserved for inherently not codable concepts without codable children: Secondary | ICD-10-CM | POA: Insufficient documentation

## 2011-10-24 DIAGNOSIS — M25669 Stiffness of unspecified knee, not elsewhere classified: Secondary | ICD-10-CM | POA: Insufficient documentation

## 2011-12-31 ENCOUNTER — Emergency Department (HOSPITAL_COMMUNITY)
Admission: EM | Admit: 2011-12-31 | Discharge: 2011-12-31 | Disposition: A | Discharge status: Home / Self Care | Payer: Medicare Other | Attending: Emergency Medicine | Admitting: Emergency Medicine

## 2011-12-31 ENCOUNTER — Encounter (HOSPITAL_COMMUNITY): Payer: Self-pay

## 2011-12-31 ENCOUNTER — Emergency Department (HOSPITAL_COMMUNITY): Payer: Medicare Other

## 2011-12-31 DIAGNOSIS — M545 Low back pain, unspecified: Secondary | ICD-10-CM | POA: Insufficient documentation

## 2011-12-31 DIAGNOSIS — R634 Abnormal weight loss: Secondary | ICD-10-CM | POA: Insufficient documentation

## 2011-12-31 DIAGNOSIS — Z79899 Other long term (current) drug therapy: Secondary | ICD-10-CM | POA: Insufficient documentation

## 2011-12-31 DIAGNOSIS — Z96659 Presence of unspecified artificial knee joint: Secondary | ICD-10-CM | POA: Insufficient documentation

## 2011-12-31 DIAGNOSIS — M069 Rheumatoid arthritis, unspecified: Secondary | ICD-10-CM | POA: Insufficient documentation

## 2011-12-31 DIAGNOSIS — M79609 Pain in unspecified limb: Secondary | ICD-10-CM | POA: Insufficient documentation

## 2011-12-31 DIAGNOSIS — E7800 Pure hypercholesterolemia, unspecified: Secondary | ICD-10-CM | POA: Insufficient documentation

## 2011-12-31 DIAGNOSIS — F329 Major depressive disorder, single episode, unspecified: Secondary | ICD-10-CM | POA: Insufficient documentation

## 2011-12-31 DIAGNOSIS — R0602 Shortness of breath: Secondary | ICD-10-CM | POA: Insufficient documentation

## 2011-12-31 DIAGNOSIS — F3289 Other specified depressive episodes: Secondary | ICD-10-CM | POA: Insufficient documentation

## 2011-12-31 HISTORY — DX: Rheumatoid arthritis, unspecified: M06.9

## 2011-12-31 LAB — CBC WITH DIFFERENTIAL/PLATELET
Basophils Absolute: 0 10*3/uL (ref 0.0–0.1)
Basophils Relative: 1 % (ref 0–1)
Eosinophils Absolute: 0.1 10*3/uL (ref 0.0–0.7)
Hemoglobin: 9.2 g/dL — ABNORMAL LOW (ref 13.0–17.0)
MCH: 21.1 pg — ABNORMAL LOW (ref 26.0–34.0)
MCHC: 31.3 g/dL (ref 30.0–36.0)
Monocytes Absolute: 0.5 10*3/uL (ref 0.1–1.0)
Monocytes Relative: 11 % (ref 3–12)
Neutro Abs: 3.2 10*3/uL (ref 1.7–7.7)
Neutrophils Relative %: 68 % (ref 43–77)
RDW: 17.9 % — ABNORMAL HIGH (ref 11.5–15.5)

## 2011-12-31 LAB — COMPREHENSIVE METABOLIC PANEL
AST: 30 U/L (ref 0–37)
Albumin: 3.1 g/dL — ABNORMAL LOW (ref 3.5–5.2)
BUN: 6 mg/dL (ref 6–23)
Creatinine, Ser: 0.53 mg/dL (ref 0.50–1.35)
Potassium: 3.6 mEq/L (ref 3.5–5.1)
Total Protein: 7.7 g/dL (ref 6.0–8.3)

## 2011-12-31 LAB — URINALYSIS, ROUTINE W REFLEX MICROSCOPIC
Glucose, UA: NEGATIVE mg/dL
Hgb urine dipstick: NEGATIVE
Leukocytes, UA: NEGATIVE
pH: 6.5 (ref 5.0–8.0)

## 2011-12-31 MED ORDER — OXYCODONE-ACETAMINOPHEN 5-325 MG PO TABS: ORAL_TABLET | ORAL | Status: AC

## 2011-12-31 MED ORDER — SODIUM CHLORIDE 0.9 % IV BOLUS (SEPSIS)
1000.0000 mL | Freq: Once | INTRAVENOUS | Status: AC
  Administered 2011-12-31: 1000 mL via INTRAVENOUS

## 2011-12-31 MED ORDER — MORPHINE SULFATE 4 MG/ML IJ SOLN
4.0000 mg | Freq: Once | INTRAMUSCULAR | Status: AC
  Administered 2011-12-31: 4 mg via INTRAVENOUS
  Filled 2011-12-31: qty 1

## 2011-12-31 NOTE — ED Notes (Signed)
Per ems, Pt from home. Multiple complaints.  Reports lower back pain and bilateral upper leg pain x 2 days. For this reason, pt has not been able to sleep.Jonathan [name] Hx of rheumatoid arthritis. Loss of appetite for 2 month and unintentional weight loss of 30 lbs within last 2 months. Pain 10/10

## 2011-12-31 NOTE — ED Notes (Signed)
AVW:[id number]<XB> Expected date:12/31/11<BR> Expected time:10:43 AM<BR> Means of arrival:Ambulance<BR> Comments:<BR> EMS

## 2011-12-31 NOTE — ED Provider Notes (Signed)
History     CSN: [id number]  Arrival date & time 12/31/11  1059   First MD Initiated Contact with Patient 12/31/11 1129      Chief Complaint  Patient presents with  . Back Pain  . Leg Pain    (Consider location/radiation/quality/duration/timing/severity/associated sxs/prior treatment) HPI  51 y/o male INAD c/o low back pain 10/10 radiating to the left knee, exacerbated by walking not relieved by tramadol worsening over the last 2 months. Pt also reports unintentional 30lb weight loss over 2 months with associated anorexia and fatigue. Endorses easy bruising. Denies night sweats, LAD, CP, SOB, Abd pain, change in bowel or bladder habits, N/V or cough. Pt has 10 pack year history, quit in 2006.   Pt had colonoscopy last year, thinks it was normal. Pt has PCP but cannot remember the name.   Past Medical History  Diagnosis Date  . Arthritis     rheumatoid  . Hypercholesterolemia   . Anemia   . Depression   . Rheumatoid arthritis     Past Surgical History  Procedure Date  . Knee arthroplasty 06/07/2011    Procedure: COMPUTER ASSISTED TOTAL KNEE ARTHROPLASTY;  Surgeon: Kathryne Hitch, MD;  Location: [facility] OR;  Service: Orthopedics;  Laterality: Right;  Right total knee arthoplasty  . Knee closed reduction 07/19/2011    Procedure: CLOSED MANIPULATION KNEE;  Surgeon: Kathryne Hitch, MD;  Location: [facility] OR;  Service: Orthopedics;  Laterality: Right;  Manipulation under anesthesia right knee  . Joint replacement     L TKA  also right in 05/2011    Family History  Problem Relation Age of Onset  . Anesthesia problems Neg Hx     History  Substance Use Topics  . Smoking status: Former Smoker    Types: Cigarettes    Quit date: 04/25/2004  . Smokeless tobacco: Not on file  . Alcohol Use: No      Review of Systems  Constitutional: Positive for appetite change and unexpected weight change. Negative for fever.  Respiratory: Negative for shortness of breath.     Cardiovascular: Negative for chest pain.  Gastrointestinal: Negative for nausea, vomiting, abdominal pain and diarrhea.  Genitourinary: Negative for dysuria.  Musculoskeletal: Positive for back pain.  Hematological: Negative for adenopathy. Bruises/bleeds easily.  All other systems reviewed and are negative.    Allergies  Review of patient's allergies indicates no known allergies.  Home Medications   Current Outpatient Rx  Name Route Sig Dispense Refill  . SERTRALINE HCL 50 MG PO TABS Oral Take 50 mg by mouth daily.    . TRAMADOL HCL 50 MG PO TABS Oral Take 50 mg by mouth every 6 (six) hours as needed. For pain      BP 130/87  Pulse 113  Temp 98.2 F (36.8 C) (Oral)  Resp 22  SpO2 100%  Physical Exam  Nursing note and vitals reviewed. Constitutional: He is oriented to person, place, and time. He appears well-developed and well-nourished. No distress.  HENT:  Head: Normocephalic and atraumatic.  Eyes: Conjunctivae and EOM are normal. Pupils are equal, round, and reactive to light. No scleral icterus.  Neck: No JVD present.  Cardiovascular: Normal rate, regular rhythm and normal heart sounds.   Pulmonary/Chest: Effort normal and breath sounds normal. No stridor. No respiratory distress. He has no wheezes. He has no rales. He exhibits no tenderness.  Abdominal: Soft. Bowel sounds are normal. He exhibits no distension and no mass. There is no tenderness. There is no rebound  and no guarding.  Musculoskeletal: Normal range of motion. He exhibits no edema.       Straight leg raise on ipsilateral leg positive at 40.  Lymphadenopathy:    He has no cervical adenopathy.    He has no axillary adenopathy.       Right: No inguinal adenopathy present.       Left: No inguinal adenopathy present.  Neurological: He is alert and oriented to person, place, and time.  Psychiatric: He has a normal mood and affect.    ED Course  Procedures (including critical care time)  Labs Reviewed   CBC WITH DIFFERENTIAL - Abnormal; Notable for the following:    Hemoglobin 9.2 (*)     HCT 29.4 (*)     MCV 67.4 (*)     MCH 21.1 (*)     RDW 17.9 (*)     All other components within normal limits  COMPREHENSIVE METABOLIC PANEL - Abnormal; Notable for the following:    Glucose, Bld 100 (*)     Albumin 3.1 (*)     Alkaline Phosphatase 118 (*)     Total Bilirubin 0.2 (*)     All other components within normal limits  URINALYSIS, ROUTINE W REFLEX MICROSCOPIC - Abnormal; Notable for the following:    APPearance CLOUDY (*)     All other components within normal limits   Dg Chest 2 View  12/31/2011  *RADIOLOGY REPORT*  Clinical Data: Low back pain, shortness of breath  CHEST - 2 VIEW  Comparison: Prior chest x-ray 05/31/2011  Findings: The lungs are well-aerated.  Negative for pulmonary edema, focal airspace consolidation, pulmonary nodule, pleural effusion or pneumothorax.  The cardiac and mediastinal contours are within normal limits, however the heart is at the upper limits of normal. No acute osseous abnormality.  Visualized upper abdomen is unremarkable.  IMPRESSION:  1.  No acute cardiopulmonary disease. 2.  Cardiopericardial silhouette size is at the upper limits of normal.   Original Report Authenticated By: Alvino Blood Lumbar Spine Complete  12/31/2011  *RADIOLOGY REPORT*  Clinical Data: Low back pain x2 - 3 days, no known injury  LUMBAR SPINE - COMPLETE 4+ VIEW  Comparison: None.  Findings: No evidence of acute fracture, or malalignment.  There is focal degenerative disease at L5-S1.  The inferior endplate of L1 is somewhat irregular with increased sclerosis, and two adjacent rounded lucencies, one anteriorly and one in the mid vertebral body.  There is mild facet arthropathy at L5-S1.  The visualized bony pelvis is intact.  There is mild bilateral hip joint osteoarthritis.  No significant foraminal stenosis on the oblique views.  IMPRESSION:  1.  Focal irregularity at L5-S1 with a increased  sclerosis, and regions of rounded lucency in the inferior endplate of L5.  This is favored to represent degenerative disc disease with prominent Schmorl's nodes, however clinical correlation for signs and symptoms of infection is recommended as osteomyelitis/diskitis can have a similar radiographic appearance.  If there is clinical concern for possible diskitis, lumbar spine MRI with and without contrast could further evaluate.  2.  Mild bilateral hip joint osteoarthritis   Original Report Authenticated By: HEATH      1. Lumbago   2. Weight loss, unintentional       MDM  51 y.o.-year-old man with chronic back pain and 30 pound weight loss potential intercourse last 2 months. Low back pain is consistent with sciatica on my physical exam with positive straight leg raise.  Patient is tachycardic and borderline dehydrated by orthostatic pressures. I will bolus 1 L.  Patient is anemic however he is within his normal range with hemoglobin between 9 and 10. Lumbar x-ray shows findings consistent with degenerative disc disease. Patient has no white count and is afebrile. Doubt this is an infectious process.  Tachycardia resolved after Bolus. Pt is stable for D/c. I have instructed him to follow with PCP for unexplained weight loss.   Patient's tachycardia has resolved after rehydration.  I spoke with the patient's cousin Aram Beecham battle her home [phone] and explained to her the findings that we have had today at the patient's request. She has told me that he has an appointment with his primary care doctor on September 16 his primary care doctor is located at [facility] community care. I have advised her that patient's pain medication will be switched from tramadol for Percocet to control his back pain and that he needs to have an evaluation as an outpatient followup on the unexplained weight loss. She verbalized her understanding and agreed with care plan.  Discussed case with attending who agrees with  plan and stability to d/c to home.         Wynetta Emery, PA-C 12/31/11 1459

## 2011-12-31 NOTE — ED Notes (Signed)
Pt transported to x-ray via stretch with tech.

## 2011-12-31 NOTE — ED Provider Notes (Signed)
Medical screening examination/treatment/procedure(s) were performed by non-physician practitioner and as supervising physician I was immediately available for consultation/collaboration.   Richardean Canal, MD 12/31/11 [phone]

## 2011-12-31 NOTE — ED Notes (Signed)
Pt returned from xray via stretcher with tech

## 2012-05-17 ENCOUNTER — Ambulatory Visit: Payer: Medicare Other | Attending: Internal Medicine | Admitting: Physical Therapy

## 2012-05-29 ENCOUNTER — Ambulatory Visit: Payer: Medicare Other | Attending: Internal Medicine | Admitting: Rehabilitative and Restorative Service Providers"

## 2012-05-29 DIAGNOSIS — R269 Unspecified abnormalities of gait and mobility: Secondary | ICD-10-CM | POA: Insufficient documentation

## 2012-05-29 DIAGNOSIS — M2560 Stiffness of unspecified joint, not elsewhere classified: Secondary | ICD-10-CM | POA: Insufficient documentation

## 2012-05-29 DIAGNOSIS — IMO0001 Reserved for inherently not codable concepts without codable children: Secondary | ICD-10-CM | POA: Insufficient documentation

## 2012-06-01 ENCOUNTER — Ambulatory Visit: Payer: Medicare Other | Admitting: Rehabilitative and Restorative Service Providers"

## 2012-06-04 ENCOUNTER — Ambulatory Visit: Payer: Medicare Other | Admitting: Rehabilitative and Restorative Service Providers"

## 2012-06-05 ENCOUNTER — Ambulatory Visit: Payer: Medicare Other | Admitting: Physical Therapy

## 2012-06-08 ENCOUNTER — Ambulatory Visit: Payer: Medicare Other | Admitting: Physical Therapy

## 2012-06-11 ENCOUNTER — Ambulatory Visit: Payer: Medicare Other | Admitting: Rehabilitative and Restorative Service Providers"

## 2012-06-14 ENCOUNTER — Ambulatory Visit: Payer: Medicare Other | Admitting: Rehabilitative and Restorative Service Providers"

## 2012-06-15 ENCOUNTER — Ambulatory Visit: Payer: Medicare Other | Admitting: Physical Therapy

## 2012-06-18 ENCOUNTER — Ambulatory Visit: Payer: Medicare Other | Admitting: Rehabilitative and Restorative Service Providers"

## 2012-06-21 ENCOUNTER — Ambulatory Visit: Payer: Medicare Other | Admitting: Physical Therapy

## 2012-06-22 ENCOUNTER — Ambulatory Visit: Payer: Medicare Other | Admitting: Physical Therapy

## 2012-06-28 ENCOUNTER — Ambulatory Visit: Payer: Medicare Other | Admitting: Rehabilitative and Restorative Service Providers"

## 2012-06-29 ENCOUNTER — Ambulatory Visit: Payer: Medicare Other | Admitting: Physical Therapy

## 2012-07-03 ENCOUNTER — Ambulatory Visit: Payer: Medicare Other | Attending: Internal Medicine | Admitting: Rehabilitative and Restorative Service Providers"

## 2012-07-03 DIAGNOSIS — IMO0001 Reserved for inherently not codable concepts without codable children: Secondary | ICD-10-CM | POA: Insufficient documentation

## 2012-07-03 DIAGNOSIS — M2560 Stiffness of unspecified joint, not elsewhere classified: Secondary | ICD-10-CM | POA: Insufficient documentation

## 2012-07-03 DIAGNOSIS — R269 Unspecified abnormalities of gait and mobility: Secondary | ICD-10-CM | POA: Insufficient documentation

## 2012-07-05 ENCOUNTER — Ambulatory Visit: Payer: Medicare Other | Admitting: Rehabilitative and Restorative Service Providers"

## 2012-07-10 ENCOUNTER — Ambulatory Visit: Payer: Medicare Other | Admitting: Physical Therapy

## 2012-07-12 ENCOUNTER — Ambulatory Visit: Payer: Medicare Other | Admitting: Rehabilitative and Restorative Service Providers"

## 2012-07-16 ENCOUNTER — Ambulatory Visit: Payer: Medicare Other | Admitting: Physical Therapy

## 2012-07-17 ENCOUNTER — Ambulatory Visit: Payer: Medicare Other | Admitting: Physical Therapy

## 2012-07-19 ENCOUNTER — Ambulatory Visit: Payer: Medicare Other | Admitting: Physical Therapy

## 2012-11-26 ENCOUNTER — Other Ambulatory Visit (HOSPITAL_COMMUNITY): Payer: Self-pay | Admitting: Family Medicine

## 2012-11-26 ENCOUNTER — Emergency Department (HOSPITAL_COMMUNITY): Payer: Medicare Other

## 2012-11-26 ENCOUNTER — Ambulatory Visit (HOSPITAL_COMMUNITY)
Admission: RE | Admit: 2012-11-26 | Discharge: 2012-11-26 | Disposition: A | Discharge status: Home / Self Care | Payer: Medicare Other | Source: Ambulatory Visit | Attending: Family Medicine | Admitting: Family Medicine

## 2012-11-26 ENCOUNTER — Encounter (HOSPITAL_COMMUNITY): Payer: Self-pay

## 2012-11-26 ENCOUNTER — Emergency Department (HOSPITAL_COMMUNITY)
Admission: EM | Admit: 2012-11-26 | Discharge: 2012-11-26 | Disposition: A | Discharge status: Home / Self Care | Payer: Medicare Other | Attending: Emergency Medicine | Admitting: Emergency Medicine

## 2012-11-26 DIAGNOSIS — R52 Pain, unspecified: Secondary | ICD-10-CM

## 2012-11-26 DIAGNOSIS — R531 Weakness: Secondary | ICD-10-CM

## 2012-11-26 DIAGNOSIS — F329 Major depressive disorder, single episode, unspecified: Secondary | ICD-10-CM | POA: Insufficient documentation

## 2012-11-26 DIAGNOSIS — M129 Arthropathy, unspecified: Secondary | ICD-10-CM | POA: Insufficient documentation

## 2012-11-26 DIAGNOSIS — R269 Unspecified abnormalities of gait and mobility: Secondary | ICD-10-CM | POA: Insufficient documentation

## 2012-11-26 DIAGNOSIS — R209 Unspecified disturbances of skin sensation: Secondary | ICD-10-CM | POA: Insufficient documentation

## 2012-11-26 DIAGNOSIS — M6281 Muscle weakness (generalized): Secondary | ICD-10-CM | POA: Insufficient documentation

## 2012-11-26 DIAGNOSIS — E7800 Pure hypercholesterolemia, unspecified: Secondary | ICD-10-CM | POA: Insufficient documentation

## 2012-11-26 DIAGNOSIS — M069 Rheumatoid arthritis, unspecified: Secondary | ICD-10-CM | POA: Insufficient documentation

## 2012-11-26 DIAGNOSIS — F489 Nonpsychotic mental disorder, unspecified: Secondary | ICD-10-CM | POA: Insufficient documentation

## 2012-11-26 DIAGNOSIS — D649 Anemia, unspecified: Secondary | ICD-10-CM | POA: Insufficient documentation

## 2012-11-26 DIAGNOSIS — F3289 Other specified depressive episodes: Secondary | ICD-10-CM | POA: Insufficient documentation

## 2012-11-26 DIAGNOSIS — R079 Chest pain, unspecified: Secondary | ICD-10-CM | POA: Insufficient documentation

## 2012-11-26 DIAGNOSIS — R634 Abnormal weight loss: Secondary | ICD-10-CM | POA: Insufficient documentation

## 2012-11-26 DIAGNOSIS — Z87891 Personal history of nicotine dependence: Secondary | ICD-10-CM | POA: Insufficient documentation

## 2012-11-26 DIAGNOSIS — R5381 Other malaise: Secondary | ICD-10-CM | POA: Insufficient documentation

## 2012-11-26 LAB — CBC WITH DIFFERENTIAL/PLATELET
Basophils Absolute: 0 10*3/uL (ref 0.0–0.1)
HCT: 28.8 % — ABNORMAL LOW (ref 39.0–52.0)
Hemoglobin: 9.4 g/dL — ABNORMAL LOW (ref 13.0–17.0)
Lymphocytes Relative: 23 % (ref 12–46)
Lymphs Abs: 1.2 10*3/uL (ref 0.7–4.0)
Monocytes Absolute: 0.5 10*3/uL (ref 0.1–1.0)
Monocytes Relative: 9 % (ref 3–12)
Neutro Abs: 3.3 10*3/uL (ref 1.7–7.7)
RBC: 4.14 MIL/uL — ABNORMAL LOW (ref 4.22–5.81)
WBC: 5.1 10*3/uL (ref 4.0–10.5)

## 2012-11-26 LAB — POCT I-STAT, CHEM 8
BUN: 5 mg/dL — ABNORMAL LOW (ref 6–23)
Calcium, Ion: 1.27 mmol/L — ABNORMAL HIGH (ref 1.12–1.23)
Chloride: 105 mEq/L (ref 96–112)
Creatinine, Ser: 0.7 mg/dL (ref 0.50–1.35)

## 2012-11-26 LAB — POCT I-STAT TROPONIN I: Troponin i, poc: 0 ng/mL (ref 0.00–0.08)

## 2012-11-26 NOTE — Progress Notes (Signed)
W. Aundria Rud RN,BSN [phone] ED CM called to room by EDP for CM consult. Pt presented to ED with generalized weakness, weight loss and gait instability s/p fall x 1 week ago. PMH rheumatoid arthritis and bilateral knee replacement in 2013.  Met with patient and family Pt lives with family 3 cousins that assist in the care. Pt receives home health care several hours per day x 7 days. Pt is not able to ambulate without assistance, has had PT/OT without improvement.  Pt receives [facility] services with [facility]: [phone]. Pt and family states, the plan is they are working on getting additional HH services through [facility], to assist  n and verbalized understanding.   No further CM needs identified. Pt is being discharged home with family, Lynnea Ferrier RN made aware to continue services.

## 2012-11-26 NOTE — ED Notes (Signed)
Bilateral swollen feet, began about 1 week ago,. Pt. Reports pain when walking. 1 edema noted.

## 2012-11-26 NOTE — ED Provider Notes (Signed)
I have supervised the resident on the management of this patient and agree with the note above. I personally interviewed and examined the patient and my addendum is below.   Jonathan [name] is a 52 y.o. male hx of rheumatoid arthritis, HL, anemia here with weakness. Bilateral leg weakness and swelling for several months. He lives with his cousins and needs help ambulating. Also loosing weight for the last year. Went to PCP a month ago and is on prednisone with no benefit. Vitals stable. Exam showed symmetric and diffuse weakness throughout. I think he is likely having worsening arthritis that prevents him to perform daily functions. His labs and CT head and CXR nl. Case management consulted and called social work to look for placement. They will try and work on getting him an aid and will try and get him more help at home. Stable for d/c.    Richardean Canal, MD 11/26/12 [phone]

## 2012-11-26 NOTE — ED Notes (Signed)
Patient transported to CT 

## 2012-11-26 NOTE — ED Notes (Addendum)
Pt reports bilateral foot edema X several months.  Pain increases with standling.  Pt states he has arthritis but no other heath problems.  No signs of trauma or injury,.  Pt alert oriented X4

## 2012-11-26 NOTE — ED Notes (Signed)
Pt gave verbal permission for Care Mgmt to call either Tarry Kos (cousin) [phone] or Vivia Budge (cousin) [phone]

## 2012-11-26 NOTE — ED Provider Notes (Signed)
CSN: [id number]     Arrival date & time 11/26/12  1306 History     First MD Initiated Contact with Patient 11/26/12 [phone]     Chief Complaint  Patient presents with  . Leg Swelling  . Chest Pain   (Consider location/radiation/quality/duration/timing/severity/associated sxs/prior Treatment) Patient is a 52 y.o. male presenting with chest pain.  Chest Pain Associated symptoms: fatigue and weakness   Associated symptoms: no abdominal pain, no back pain, no diaphoresis, no dizziness, no dysphagia, no fever, no headache, no nausea, no palpitations, no shortness of breath and not vomiting    Johnatan Baskette is a 52 year old male with PMH of rheumatoid arthritis, HLD, Anemia, Depression, s/p bilateral knee replacement in 2013 who presents to the ED with complaints of variable LE edema and pain, generalized weakness, gait instability, weight loss, and fall 1 week ago.  He is accompanied by 3 cousins whom he lives with.  They report these symptoms have been ongoing for the past number of months and started after his knee surgery.  They reports that after the surgery he initially was doing well, he participated in rehab and home physical therapy.  However he has gradually developed this weakness and physical therapy was unable to help.  He continues to have Home health nursing come and help him bath and feed himself but he requires a lot of attention.  He does see his PCP Dr. Roseanne Reno regularly and they have brought these complaints to his attention, most recently on July 10th they saw the PCP and he started a course of prednisone to help with his arthritis and to help him gain weight.  Of note he has had 2 ED visits for similar complaints since the surgery.  The patient's family report he was bought in today because they are frustrated that he still continues to be weak and the prednisone has not helped him to gain weight.  They feel that the hospital is the best place for him to get better.  Past Medical  History  Diagnosis Date  . Arthritis     rheumatoid  . Hypercholesterolemia   . Anemia   . Depression   . Rheumatoid arthritis(714.0)    Past Surgical History  Procedure Laterality Date  . Knee arthroplasty  06/07/2011    Procedure: COMPUTER ASSISTED TOTAL KNEE ARTHROPLASTY;  Surgeon: Kathryne Hitch, MD;  Location: [facility] OR;  Service: Orthopedics;  Laterality: Right;  Right total knee arthoplasty  . Knee closed reduction  07/19/2011    Procedure: CLOSED MANIPULATION KNEE;  Surgeon: Kathryne Hitch, MD;  Location: [facility] OR;  Service: Orthopedics;  Laterality: Right;  Manipulation under anesthesia right knee  . Joint replacement      L TKA  also right in 05/2011   Family History  Problem Relation Age of Onset  . Anesthesia problems Neg Hx    History  Substance Use Topics  . Smoking status: Former Smoker    Types: Cigarettes    Quit date: 04/25/2004  . Smokeless tobacco: Not on file  . Alcohol Use: No    Review of Systems  Constitutional: Positive for fatigue and unexpected weight change (report lost 100 lbs since Feb 2013). Negative for fever, chills and diaphoresis.  HENT: Negative for trouble swallowing, neck pain and neck stiffness.   Eyes: Negative for visual disturbance.  Respiratory: Negative for chest tightness, shortness of breath and wheezing.   Cardiovascular: Positive for chest pain (tingleing sensation has been present for months) and leg swelling (  variable). Negative for palpitations.  Gastrointestinal: Negative for nausea, vomiting, abdominal pain, diarrhea and constipation.  Genitourinary: Negative for dysuria and difficulty urinating.  Musculoskeletal: Positive for myalgias, arthralgias and gait problem. Negative for back pain and joint swelling.  Skin: Negative for rash.  Neurological: Positive for weakness. Negative for dizziness, tremors, syncope, facial asymmetry, speech difficulty and headaches.  Psychiatric/Behavioral: Positive for suicidal ideas  (reports 1 episode, plan ->medications, did not go through->spoke to cousin about).    Allergies  Review of patient's allergies indicates no known allergies.  Home Medications   Current Outpatient Rx  Name  Route  Sig  Dispense  Refill  . oxyCODONE-acetaminophen (PERCOCET) 10-325 MG per tablet   Oral   Take 1 tablet by mouth every 4 (four) hours as needed for pain.          BP 126/80  Pulse 65  Temp(Src) 98.2 F (36.8 C) (Oral)  Resp 16  SpO2 100% Physical Exam  Constitutional: He is oriented to person, place, and time. No distress.  Appears older than stated age  HENT:  Head: Normocephalic and atraumatic.  Eyes: Pupils are equal, round, and reactive to light. No scleral icterus.  Right eye deviation to right (chronic)  Cardiovascular: Normal rate, regular rhythm, normal heart sounds and intact distal pulses.  Exam reveals no gallop and no friction rub.   No murmur heard. Pulmonary/Chest: Effort normal and breath sounds normal. No respiratory distress. He has no wheezes. He has no rales. He exhibits no tenderness.  Abdominal: Soft. Bowel sounds are normal. He exhibits no distension. There is no tenderness. There is no rebound and no guarding.  Musculoskeletal: He exhibits edema (mild non-pitting edmea b/l lower extremities). He exhibits no tenderness.  Neurological: He is alert and oriented to person, place, and time. No cranial nerve deficit (except right eye deviation to right).  Skin: Skin is warm and dry. No rash noted. He is not diaphoretic. No pallor.    ED Course   Procedures (including critical care time)  Date: 11/26/2012  Rate: 64  Rhythm: normal sinus rhythm  QRS Axis: normal  Intervals: normal  ST/T Wave abnormalities: normal  Conduction Disutrbances:none and nonspecific intraventricular conduction delay  Narrative Interpretation: Sinus rhythm, minimal criteria for LVH  Old EKG Reviewed: 05/30/12 sinus tachycardia, no RBBB or LVH noted.   Labs Reviewed   CBC WITH DIFFERENTIAL - Abnormal; Notable for the following:    RBC 4.14 (*)    Hemoglobin 9.4 (*)    HCT 28.8 (*)    MCV 69.6 (*)    MCH 22.7 (*)    RDW 17.3 (*)    All other components within normal limits  POCT I-STAT, CHEM 8 - Abnormal; Notable for the following:    Potassium 3.4 (*)    BUN 5 (*)    Calcium, Ion 1.27 (*)    Hemoglobin 10.2 (*)    HCT 30.0 (*)    All other components within normal limits  POCT I-STAT TROPONIN I   Dg Chest 2 View  11/26/2012   *RADIOLOGY REPORT*  Clinical Data: Bilateral leg swelling.  Previous smoker  CHEST - 2 VIEW  Comparison: November 26, 2012  Findings: There is no focal infiltrate, pulmonary edema, or pleural effusion.  There is mild atelectasis of left lung base.  The mediastinal contour and cardiac silhouette are stable.  The soft tissues and osseous structures are stable.  IMPRESSION: Probable mild atelectasis of left lung base. No focal pneumonia.   Original Report  Authenticated By: Sherian Rein, M.D.   Dg Chest 2 View  11/26/2012   *RADIOLOGY REPORT*  Clinical Data: Chest discomfort, some shortness of breath, swelling of the feet  CHEST - 2 VIEW  Comparison: Chest x-ray of 12/31/2011  Findings: No active infiltrate or effusion is seen.  Mediastinal contours are stable.  Cardiomegaly is stable.  Both humeral heads appear abnormal, possibly due to an erosive arthritis.  IMPRESSION:  1.  No active lung disease.  Stable mild cardiomegaly. 2.  Question erosive arthritis with marked abnormality of both humeral heads.   Original Report Authenticated By: Dwyane Dee, M.D.   No diagnosis found.  MDM  52 year old male who presents to the ED for chronic issues post b/l knee replacement.  Denies any current acute complaints.   CXR shows mild atelectasis of left lung base without focal pneumonia, mild cardiomegaly, erosive arthritis of both humeral heads. EKG shows sinus rhythm, no ST changes, minimal criteria for LVH not much changed from previous EKG K+  3.4 Microcytic Anemia H/H 9.4/28.8  Current lower extremity edema is minimal, non pitting, no signs of CHF on exam.  DVT not likely as swelling is chronic, bilateral, variable, and no calf tenderness.  Patient has generalized weakness not acute process identified, may benefit from increased nursing care.  Patient instructed to follow up with PCP for workup of Microcytic anemia and to reevaluate potassium level as is currently at lower limit of normal.  Discussed case with case manager, who saw that patient and spoke with family. Patient already has Child psychotherapist on case who will work on possible placement to SNF from home.  Case manger will also talk to [facility] to discuss getting more frequent visits.    Carlynn Purl, DO 11/26/12 1900

## 2013-01-06 ENCOUNTER — Emergency Department (HOSPITAL_COMMUNITY)
Admission: EM | Admit: 2013-01-06 | Discharge: 2013-01-06 | Disposition: A | Discharge status: Home / Self Care | Payer: Medicare Other | Attending: Emergency Medicine | Admitting: Emergency Medicine

## 2013-01-06 ENCOUNTER — Encounter (HOSPITAL_COMMUNITY): Payer: Self-pay | Admitting: Emergency Medicine

## 2013-01-06 DIAGNOSIS — L8991 Pressure ulcer of unspecified site, stage 1: Secondary | ICD-10-CM | POA: Insufficient documentation

## 2013-01-06 DIAGNOSIS — L89899 Pressure ulcer of other site, unspecified stage: Secondary | ICD-10-CM | POA: Insufficient documentation

## 2013-01-06 DIAGNOSIS — Z87891 Personal history of nicotine dependence: Secondary | ICD-10-CM | POA: Insufficient documentation

## 2013-01-06 DIAGNOSIS — Z8739 Personal history of other diseases of the musculoskeletal system and connective tissue: Secondary | ICD-10-CM | POA: Insufficient documentation

## 2013-01-06 DIAGNOSIS — Z8639 Personal history of other endocrine, nutritional and metabolic disease: Secondary | ICD-10-CM | POA: Insufficient documentation

## 2013-01-06 DIAGNOSIS — Z8659 Personal history of other mental and behavioral disorders: Secondary | ICD-10-CM | POA: Insufficient documentation

## 2013-01-06 DIAGNOSIS — Z862 Personal history of diseases of the blood and blood-forming organs and certain disorders involving the immune mechanism: Secondary | ICD-10-CM | POA: Insufficient documentation

## 2013-01-06 NOTE — ED Provider Notes (Signed)
CSN: [id number]     Arrival date & time 01/06/13  1934 History   First MD Initiated Contact with Patient 01/06/13 1952     Chief Complaint  Patient presents with  . Leg Swelling    HPI  Patient presents with his family. He apparently has history last osteoarthritis and rarely leaves his recliner and is home. Disappointed at home health care nursing that comes and checks on him every other day.  Daughter noticed a small area of skin breakdown on his lower leg week ago. Home health nurse today noted that it was a little bigger. He does not have any pain. He denies any other areas of skin breakdown as does the family. There's been no change in his behavior interactions level of health, or medications  Past Medical History  Diagnosis Date  . Arthritis     rheumatoid  . Hypercholesterolemia   . Anemia   . Depression   . Rheumatoid arthritis(714.0)    Past Surgical History  Procedure Laterality Date  . Knee arthroplasty  06/07/2011    Procedure: COMPUTER ASSISTED TOTAL KNEE ARTHROPLASTY;  Surgeon: Kathryne Hitch, MD;  Location: [facility] OR;  Service: Orthopedics;  Laterality: Right;  Right total knee arthoplasty  . Knee closed reduction  07/19/2011    Procedure: CLOSED MANIPULATION KNEE;  Surgeon: Kathryne Hitch, MD;  Location: [facility] OR;  Service: Orthopedics;  Laterality: Right;  Manipulation under anesthesia right knee  . Joint replacement      L TKA  also right in 05/2011   Family History  Problem Relation Age of Onset  . Anesthesia problems Neg Hx    History  Substance Use Topics  . Smoking status: Former Smoker    Types: Cigarettes    Quit date: 04/25/2004  . Smokeless tobacco: Not on file  . Alcohol Use: No    Review of Systems  Constitutional: Negative for fever, chills, diaphoresis, appetite change and fatigue.  HENT: Negative for sore throat, mouth sores and trouble swallowing.   Eyes: Negative for visual disturbance.  Respiratory: Negative for cough, chest  tightness, shortness of breath and wheezing.   Cardiovascular: Negative for chest pain.  Gastrointestinal: Negative for nausea, vomiting, abdominal pain, diarrhea and abdominal distention.  Endocrine: Negative for polydipsia, polyphagia and polyuria.  Genitourinary: Negative for dysuria, frequency and hematuria.  Musculoskeletal: Positive for myalgias, joint swelling, arthralgias and gait problem.       Chronic hip and knee pain rarely ambulates  Skin: Positive for wound.       New pressure ulcer right lower leg  Neurological: Negative for dizziness, syncope, light-headedness and headaches.  Hematological: Does not bruise/bleed easily.  Psychiatric/Behavioral: Negative for behavioral problems and confusion.    Allergies  Review of patient's allergies indicates no known allergies.  Home Medications   Current Outpatient Rx  Name  Route  Sig  Dispense  Refill  . oxyCODONE-acetaminophen (PERCOCET) 10-325 MG per tablet   Oral   Take 1 tablet by mouth every 4 (four) hours as needed for pain.          BP 129/80  Pulse 99  Temp(Src) 98.3 F (36.8 C) (Oral)  Resp 16  SpO2 100% Physical Exam  Constitutional: He is oriented to person, place, and time. He appears well-developed and well-nourished. No distress.  HENT:  Head: Normocephalic.  Eyes: Conjunctivae are normal. Pupils are equal, round, and reactive to light. No scleral icterus.  Neck: Normal range of motion. Neck supple. No thyromegaly present.  Cardiovascular:  Normal rate and regular rhythm.  Exam reveals no gallop and no friction rub.   No murmur heard. Pulmonary/Chest: Effort normal and breath sounds normal. No respiratory distress. He has no wheezes. He has no rales.  Abdominal: Soft. Bowel sounds are normal. He exhibits no distension. There is no tenderness. There is no rebound.  Musculoskeletal: Normal range of motion.  Neurological: He is alert and oriented to person, place, and time.  Skin: Skin is warm and dry.      Psychiatric: He has a normal mood and affect. His behavior is normal.    ED Course  Procedures (including critical care time) Labs Review Labs Reviewed - No data to display Imaging Review No results found.  MDM   1. Pressure ulcer, stage I    No sign of infectionextensive evaluation. Encouraged him to be up and around more avoid laying in a chair with pressure on his leg. He is placed in a simple dressing the wound here. Continue with the every other day dressing changes with his home health care nurse    Claudean Kinds, MD 01/06/13 2037

## 2013-01-06 NOTE — ED Notes (Signed)
Patient with bilateral feet and ankle swelling.  Patient states he is having pain in his feet and legs.  Patient with ulcer on right calf.  No drainage noted.

## 2013-01-13 ENCOUNTER — Emergency Department (HOSPITAL_COMMUNITY): Payer: Medicare Other

## 2013-01-13 ENCOUNTER — Encounter (HOSPITAL_COMMUNITY): Payer: Self-pay | Admitting: Emergency Medicine

## 2013-01-13 ENCOUNTER — Emergency Department (HOSPITAL_COMMUNITY)
Admission: EM | Admit: 2013-01-13 | Discharge: 2013-01-13 | Disposition: A | Discharge status: Home / Self Care | Payer: Medicare Other | Attending: Emergency Medicine | Admitting: Emergency Medicine

## 2013-01-13 DIAGNOSIS — Z87891 Personal history of nicotine dependence: Secondary | ICD-10-CM | POA: Insufficient documentation

## 2013-01-13 DIAGNOSIS — L97809 Non-pressure chronic ulcer of other part of unspecified lower leg with unspecified severity: Secondary | ICD-10-CM | POA: Insufficient documentation

## 2013-01-13 DIAGNOSIS — Z8639 Personal history of other endocrine, nutritional and metabolic disease: Secondary | ICD-10-CM | POA: Insufficient documentation

## 2013-01-13 DIAGNOSIS — L97919 Non-pressure chronic ulcer of unspecified part of right lower leg with unspecified severity: Secondary | ICD-10-CM | POA: Diagnosis present

## 2013-01-13 DIAGNOSIS — Z862 Personal history of diseases of the blood and blood-forming organs and certain disorders involving the immune mechanism: Secondary | ICD-10-CM | POA: Insufficient documentation

## 2013-01-13 DIAGNOSIS — L8990 Pressure ulcer of unspecified site, unspecified stage: Secondary | ICD-10-CM | POA: Diagnosis present

## 2013-01-13 DIAGNOSIS — Z8739 Personal history of other diseases of the musculoskeletal system and connective tissue: Secondary | ICD-10-CM | POA: Insufficient documentation

## 2013-01-13 DIAGNOSIS — Z8659 Personal history of other mental and behavioral disorders: Secondary | ICD-10-CM | POA: Insufficient documentation

## 2013-01-13 DIAGNOSIS — L89309 Pressure ulcer of unspecified buttock, unspecified stage: Secondary | ICD-10-CM | POA: Insufficient documentation

## 2013-01-13 DIAGNOSIS — L8992 Pressure ulcer of unspecified site, stage 2: Secondary | ICD-10-CM | POA: Insufficient documentation

## 2013-01-13 LAB — COMPREHENSIVE METABOLIC PANEL
AST: 30 U/L (ref 0–37)
Alkaline Phosphatase: 109 U/L (ref 39–117)
BUN: 5 mg/dL — ABNORMAL LOW (ref 6–23)
CO2: 27 mEq/L (ref 19–32)
Calcium: 9.6 mg/dL (ref 8.4–10.5)
Chloride: 99 mEq/L (ref 96–112)
Creatinine, Ser: 0.44 mg/dL — ABNORMAL LOW (ref 0.50–1.35)
GFR calc non Af Amer: >90 mL/min (ref >90)
Glucose, Bld: 99 mg/dL (ref 70–99)
Total Bilirubin: 0.4 mg/dL (ref 0.3–1.2)

## 2013-01-13 LAB — URINALYSIS W MICROSCOPIC + REFLEX CULTURE
Bilirubin Urine: NEGATIVE
Hgb urine dipstick: NEGATIVE
Ketones, ur: NEGATIVE mg/dL
Nitrite: NEGATIVE
Protein, ur: NEGATIVE mg/dL
pH: 7 (ref 5.0–8.0)

## 2013-01-13 LAB — CBC WITH DIFFERENTIAL/PLATELET
HCT: 29.2 % — ABNORMAL LOW (ref 39.0–52.0)
Hemoglobin: 9.7 g/dL — ABNORMAL LOW (ref 13.0–17.0)
Lymphocytes Relative: 18 % (ref 12–46)
Lymphs Abs: 0.7 10*3/uL (ref 0.7–4.0)
Monocytes Absolute: 0.3 10*3/uL (ref 0.1–1.0)
Monocytes Relative: 9 % (ref 3–12)
Neutro Abs: 2.8 10*3/uL (ref 1.7–7.7)
RBC: 4.24 MIL/uL (ref 4.22–5.81)
WBC: 3.9 10*3/uL — ABNORMAL LOW (ref 4.0–10.5)

## 2013-01-13 LAB — PRO B NATRIURETIC PEPTIDE: Pro B Natriuretic peptide (BNP): 52.2 pg/mL (ref 0–125)

## 2013-01-13 LAB — TROPONIN I: Troponin I: <0.3 ng/mL (ref <0.30)

## 2013-01-13 NOTE — Progress Notes (Signed)
Asked to see patient for stage II pressure ulceration on bilateral ischial tuberosities.  Pt.has decreased mobility and poor hygiene, putting him at risk for sacral wounds.  Ischial wounds measure 1.75 cm x 2.0 cm x .25 cm. Edges are not approximated. Peri-wound area is intact and red.   Small amt. Of sanguinous drainage present.  Recommendations are as follows:  Pressure displacement cushion with scheduled shifting of weight side to side every hour while in chair and every 2 hours when in bed.  Patient needs to increase level of activity.  Placement of sacral Allevyn dressing on wounds to capture exudate and encourage granulation while maintaining clean environment. Second wound on right calf noted as well and is an unstageable wound with large area of central necrosis.  Wound measures 2.5 cm x 3.0 cm. With serous - tan exudate noted.  Peri-wound area is intact with no redness noted.  Because of exudate, recommendation for Allevyn wound dressing made.  Patient should see PCP within next week for referral to wound care clinic.  It should be noted that patient has home health services.  ? Agency.  Thank you for asking me to see this patient.  Marge K. Meryl Crutch, RN, [facility]

## 2013-01-13 NOTE — ED Notes (Signed)
Per pt's cousin pt c/o bilateral leg swelling and pressure ulcer to sacrum and rt leg. Pt has stopped walking last year after a knee replacement. Pt sits in recliner all day and sleeps in recliner at night. Pt is supposed to use a cane and walker to ambulate but does not ambulate at tall. Pt is very weak, hardly able to lift legs from bed or turn self over. Pt takes percocet for pain. Per niece pt has been to his pcp and told he needs to get up and move around more.

## 2013-01-13 NOTE — ED Notes (Signed)
Pt here with family c/o bilateral foot swelling and wound to right leg and buttocks; pt sts increased pain with walking

## 2013-01-13 NOTE — ED Provider Notes (Signed)
CSN: [id number]     Arrival date & time 01/13/13  1043 History   First MD Initiated Contact with Patient 01/13/13 1112     Chief Complaint  Patient presents with  . Foot Swelling  . Wound Check   (Consider location/radiation/quality/duration/timing/severity/associated sxs/prior Treatment) HPI Comments: The patient is a 52 year old male with a history of chronic lower extremity edema and ulcerative wounds on his right leg and buttocks who presents for wound evaluation. The lesion on his right lower extremity is a known lesion. The lesion on his buttocks is new in recent weeks. The patient swelling is unchanged from baseline in his lower extremities. He denies any chest pain, shortness of breath, fever, vomiting, diarrhea. The family states that he will not get out of his recliner to get up and move around during the day. They state that he is otherwise been well. He has had similar symptoms in the past.  The history is provided by the patient.    Past Medical History  Diagnosis Date  . Arthritis     rheumatoid  . Hypercholesterolemia   . Anemia   . Depression   . Rheumatoid arthritis(714.0)    Past Surgical History  Procedure Laterality Date  . Knee arthroplasty  06/07/2011    Procedure: COMPUTER ASSISTED TOTAL KNEE ARTHROPLASTY;  Surgeon: Kathryne Hitch, MD;  Location: [facility] OR;  Service: Orthopedics;  Laterality: Right;  Right total knee arthoplasty  . Knee closed reduction  07/19/2011    Procedure: CLOSED MANIPULATION KNEE;  Surgeon: Kathryne Hitch, MD;  Location: [facility] OR;  Service: Orthopedics;  Laterality: Right;  Manipulation under anesthesia right knee  . Joint replacement      L TKA  also right in 05/2011   Family History  Problem Relation Age of Onset  . Anesthesia problems Neg Hx    History  Substance Use Topics  . Smoking status: Former Smoker    Types: Cigarettes    Quit date: 04/25/2004  . Smokeless tobacco: Not on file  . Alcohol Use: No    Review of  Systems  Constitutional: Negative for fever.  HENT: Negative for rhinorrhea, drooling and neck pain.   Eyes: Negative for pain.  Respiratory: Negative for cough and shortness of breath.   Cardiovascular: Negative for chest pain and leg swelling.  Gastrointestinal: Negative for nausea, vomiting, abdominal pain and diarrhea.  Genitourinary: Negative for dysuria and hematuria.  Musculoskeletal: Negative for gait problem.  Skin: Negative for color change.  Neurological: Negative for numbness and headaches.  Hematological: Negative for adenopathy.  Psychiatric/Behavioral: Negative for behavioral problems.  All other systems reviewed and are negative.    Allergies  Review of patient's allergies indicates no known allergies.  Home Medications   Current Outpatient Rx  Name  Route  Sig  Dispense  Refill  . oxyCODONE-acetaminophen (PERCOCET) 10-325 MG per tablet   Oral   Take 1 tablet by mouth every 4 (four) hours as needed for pain.          BP 105/65  Pulse 85  Temp(Src) 98.5 F (36.9 C) (Oral)  Resp 18  SpO2 98% Physical Exam  Nursing note and vitals reviewed. Constitutional: He is oriented to person, place, and time. He appears well-developed and well-nourished.  HENT:  Head: Normocephalic and atraumatic.  Right Ear: External ear normal.  Left Ear: External ear normal.  Nose: Nose normal.  Mouth/Throat: Oropharynx is clear and moist. No oropharyngeal exudate.  Eyes: Conjunctivae and EOM are normal. Pupils are equal,  round, and reactive to light.  Neck: Normal range of motion. Neck supple.  Cardiovascular: Normal rate, regular rhythm, normal heart sounds and intact distal pulses.  Exam reveals no gallop and no friction rub.   No murmur heard. Pulmonary/Chest: Effort normal and breath sounds normal. No respiratory distress. He has no wheezes.  Abdominal: Soft. Bowel sounds are normal. He exhibits no distension. There is no tenderness. There is no rebound and no guarding.   Musculoskeletal: Normal range of motion. He exhibits edema (moderate pitting edema extending to mid shin bilaterally). He exhibits no tenderness.  Unstagable approx 3x3 cm ulcer to right lateral calf.   Stage 2 ulcers approx 2x2 cm on right and left upper buttock.   Neurological: He is alert and oriented to person, place, and time.  Skin: Skin is warm and dry.  Psychiatric: He has a normal mood and affect. His behavior is normal.    ED Course  Procedures (including critical care time) Labs Review Labs Reviewed  CBC WITH DIFFERENTIAL - Abnormal; Notable for the following:    WBC 3.9 (*)    Hemoglobin 9.7 (*)    HCT 29.2 (*)    MCV 68.9 (*)    MCH 22.9 (*)    RDW 16.4 (*)    All other components within normal limits  COMPREHENSIVE METABOLIC PANEL - Abnormal; Notable for the following:    Potassium 3.2 (*)    BUN 5 (*)    Creatinine, Ser 0.44 (*)    Albumin 3.4 (*)    All other components within normal limits  TROPONIN I  PRO B NATRIURETIC PEPTIDE  URINALYSIS W MICROSCOPIC + REFLEX CULTURE   Imaging Review Dg Chest 2 View  01/13/2013   *RADIOLOGY REPORT*  Clinical Data: Chest pain  CHEST - 2 VIEW  Comparison: November 26, 2012  Findings: There is no focal infiltrate, pulmonary edema, or pleural effusion.  The mediastinal contour and cardiac silhouette are normal.  The soft tissues and osseous structures are stable.  IMPRESSION: No acute cardiopulmonary disease identified.   Original Report Authenticated By: Sherian Rein, M.D.     Date: 01/13/2013  Rate: 71  Rhythm: normal sinus rhythm  QRS Axis: normal  Intervals: normal  ST/T Wave abnormalities: nonspecific ST/T changes  Conduction Disutrbances:none  Narrative Interpretation: Probable LVH w/ abnormal R wave progression, suspect j point elevation in precordial leads  Old EKG Reviewed: changes noted   MDM   1. Pressure ulcer, stage II   2. Chronic ulcer of right leg, with unspecified severity    11:59 AM 52 y.o. male  who presents with chronic edema to the bilateral lower extremities which is unchanged and request for evaluation of an ulcer on his right calf and sacral area. The family notes that the patient has little motivation and sits in his recliner most of the day. They note that he has been kicked out of physical therapy due to lack of effort. They note that he is otherwise been well and has not had fever, vomiting, diarrhea, chest pain, or shortness of breath. He is afebrile and vital signs are unremarkable here today. He appears well on exam. Will get screening labwork.  Had a wound nurse come down to evaluate the patient. Please see her note for details. Will place allevyn soft foam to ulcers. Discussed pressure ulcer prevention w/ daughter and patient. Will have pt f/u w/ pcp for wound center referral.   4:29 PM:  I have discussed the diagnosis/risks/treatment options with the patient  and caregiver and believe the pt to be eligible for discharge home to follow-up with pcp for wound center referral. We also discussed returning to the ED immediately if new or worsening sx occur. We discussed the sx which are most concerning (e.g., fever, worsening pain) that necessitate immediate return. Any new prescriptions provided to the patient are listed below.    Junius Argyle, MD 01/13/13 1940

## 2013-01-13 NOTE — ED Notes (Signed)
Discharge instructions reviewed with pt and family. Pt verbalized understanding.  

## 2013-02-04 ENCOUNTER — Encounter: Payer: Self-pay | Admitting: Internal Medicine

## 2013-03-05 ENCOUNTER — Ambulatory Visit (INDEPENDENT_AMBULATORY_CARE_PROVIDER_SITE_OTHER): Payer: Medicare Other | Admitting: Internal Medicine

## 2013-03-05 ENCOUNTER — Other Ambulatory Visit: Payer: Self-pay

## 2013-03-05 ENCOUNTER — Encounter: Payer: Self-pay | Admitting: Internal Medicine

## 2013-03-05 VITALS — BP 120/70 | HR 84 | Ht 66.0 in | Wt 134.8 lb

## 2013-03-05 DIAGNOSIS — K625 Hemorrhage of anus and rectum: Secondary | ICD-10-CM

## 2013-03-05 DIAGNOSIS — Z1211 Encounter for screening for malignant neoplasm of colon: Secondary | ICD-10-CM

## 2013-03-05 DIAGNOSIS — Z7409 Other reduced mobility: Secondary | ICD-10-CM

## 2013-03-05 DIAGNOSIS — R195 Other fecal abnormalities: Secondary | ICD-10-CM

## 2013-03-05 DIAGNOSIS — R69 Illness, unspecified: Secondary | ICD-10-CM

## 2013-03-05 DIAGNOSIS — D509 Iron deficiency anemia, unspecified: Secondary | ICD-10-CM

## 2013-03-05 NOTE — Progress Notes (Signed)
HISTORY OF PRESENT ILLNESS:  Jonathan [name] is a 52 y.o. male with osteoarthritis who is sent today by his primary provider regarding microcytic anemia and rectal bleeding. The patient is pleasant, but a terrible historian. I needed to call his primary provider, Dr. Roseanne Reno, for more information. Patient is apparently somewhat new to Dr. Roseanne Reno. He states that the patient does not have rheumatoid arthritis as listed below, but rather osteoarthritis. He has had knee replacements. He is for the most part wheelchair-bound. Has had problems with pressure ulcers. He is accompanied by a friend. Looking at outside laboratories, patient has had microcytic anemia for greater than one year. In September 2013 his hemoglobin was 9.2 with an MCV of 67.4. In September of 2014 his hemoglobin was 9.7 with MCV of 68.9. This summer he was anemic. Hemoccult cards were performed and apparently returned abnormal. Review of other laboratories finds other blood counts normal as well, unremarkable comprehensive metabolic panel except for mildly elevated glucose. Aside from intermittent minor rectal bleeding the patient reports constipation with hard stools and weight loss. He denies prior GI history.  REVIEW OF SYSTEMS:  All non-GI ROS negative except for arthritis, knee pain, weakness  Past Medical History  Diagnosis Date  . Arthritis     rheumatoid  . Hypercholesterolemia   . Anemia   . Depression   . Rheumatoid arthritis(714.0)   . Cellulitis     Past Surgical History  Procedure Laterality Date  . Knee arthroplasty  06/07/2011    Procedure: COMPUTER ASSISTED TOTAL KNEE ARTHROPLASTY;  Surgeon: Kathryne Hitch, MD;  Location: [facility] OR;  Service: Orthopedics;  Laterality: Right;  Right total knee arthoplasty  . Knee closed reduction  07/19/2011    Procedure: CLOSED MANIPULATION KNEE;  Surgeon: Kathryne Hitch, MD;  Location: [facility] OR;  Service: Orthopedics;  Laterality: Right;  Manipulation under anesthesia  right knee  . Joint replacement      L TKA  also right in 05/2011    Social History Azarel Banner  reports that he quit smoking about 8 years ago. His smoking use included Cigarettes. He smoked 0.00 packs per day. He has never used smokeless tobacco. He reports that he does not drink alcohol or use illicit drugs.  family history includes Cancer in his mother. There is no history of Anesthesia problems.  No Known Allergies     PHYSICAL EXAMINATION: Vital signs: BP 120/70  Pulse 84  Ht 5\' 6"  (1.676 m)  Wt 134 lb 12.8 oz (61.145 kg)  BMI 21.77 kg/m2  Constitutional: Chronically ill-appearing, no acute distress. Appears much older than stated age. In wheelchair Psychiatric: alert and oriented x3, cooperative Eyes: extraocular movements intact, anicteric, conjunctiva pink Mouth: oral pharynx moist, no lesions Neck: supple no lymphadenopathy Cardiovascular: heart regular rate and rhythm, no murmur Lungs: clear to auscultation bilaterally Abdomen: soft, nontender, nondistended, no obvious ascites, no peritoneal signs, normal bowel sounds, no organomegaly Rectal: Deferred until colonoscopy Extremities: no lower extremity edema bilaterally.  Skin: no lesions on visible extremities. Dry skin Neuro: No focal deficits.   ASSESSMENT:  #1. Chronic microcytic anemia. Hemoglobin is quite stable for some time #2. Hemoccult-positive stool per PCP #3. Rectal bleeding per patient. Minor intermittent #4. History of osteoarthritis with knee replacement. Quite limited mobility   PLAN:  #1. Patient needs colonoscopy and upper endoscopy. I do not think that he is suitable for outpatient procedures. As such, we will make arrangements for overnight hospitalization with preparation on day one and procedures on day  2. He will likely be able to be discharged home on day 2.The nature of the procedure, as well as the risks, benefits, and alternatives were carefully and thoroughly reviewed with the  patient. Ample time for discussion and questions allowed. The patient understood, was satisfied, and agreed to proceed.. I discussed this with the patient, his friend, and Dr. Roseanne Reno. If for some reason the patient has interval clinical problems that necessitated hospitalization, that would be recommended with GI consultation available.

## 2013-03-05 NOTE — Patient Instructions (Signed)
We will schedule you for an endoscopy/colonoscopy in [facility]  You will be admitted the day before to complete your prep.  I will call you when I have a date and time

## 2013-03-13 ENCOUNTER — Encounter (HOSPITAL_COMMUNITY): Payer: Self-pay | Admitting: Pharmacy Technician

## 2013-03-26 ENCOUNTER — Telehealth: Payer: Self-pay | Admitting: Internal Medicine

## 2013-03-26 NOTE — Telephone Encounter (Signed)
According to OV note pt is to be admitted Sunday for colon prep and procedure on Monday. Pt wanted to know what time to go to the hospital. Discussed with pt that the hospital will call him when the bed is available. Gave him the number for bed control also. Pt aware.

## 2013-03-27 ENCOUNTER — Telehealth: Payer: Self-pay

## 2013-03-27 NOTE — Telephone Encounter (Signed)
See phone note dated 12/3 regarding change of procedure date

## 2013-03-27 NOTE — Telephone Encounter (Signed)
I spoke with Jonathan [name] in endo and changed patient's hospital procedure from 12/8 to 12/9, with admission to hospital for prep on 12/8.  I called hospital admitting to make sure change had been made.  I then called and talked to patient's emergency contact, informing her of the change, and letting her know they could expect a call soon from the hospital to clarify when to come in for admission.  She acknowledged and understood.

## 2013-03-27 NOTE — Telephone Encounter (Signed)
Message copied by Jeanine Luz on Wed Mar 27, 2013  9:12 AM ------      Message from: Hilarie Fredrickson      Created: Tue Mar 26, 2013  9:13 PM       Tuesday would be fine, then. Get the Colon and EGD scheduled right before or after my other cases. Let me and extender (which one is it?) know the time when set. Thanks       ----- Message -----         From: Jeanine Luz, CMA         Sent: 03/26/2013   4:20 PM           To: Hilarie Fredrickson, MD            The only MAC options would be Tuesday 12/8 or Thursday 12/10 at 7:30am.              ----- Message -----         From: Hilarie Fredrickson, MD         Sent: 03/26/2013   3:59 PM           To: Jeanine Luz, CMA            How about scheduling the procedure for Wednesday. He could be admitted on Tuesday for prep work. Get the extender covering the hospital that week involved, if not already. They need to be prepared for the admission. Let me know. Thanks      ----- Message -----         From: Jeanine Luz, CMA         Sent: 03/26/2013   3:21 PM           To: Hilarie Fredrickson, MD            Patient scheduled for hospital endo/colon at the hospital next week, admitted for prep the day before.  He was inadvertently scheduled to be admitted on Sunday 12/7 for procedure 12/8.  I did not realize I should not have scheduled him for admitting on a Sunday.  You have 3 procedures scheduled for Tuesday 12/9.  Would you be willing to do his as well if I have him moved to that day?                     ------

## 2013-03-28 ENCOUNTER — Telehealth: Payer: Self-pay

## 2013-03-28 NOTE — Telephone Encounter (Signed)
Spoke with Leanord Asal, one of pt's caretakers, and reiterated that they should be hearing from the hospital tomorrow to tell pt what time to be there for admission.  I told her I would follow up with them as well to make sure it was done.  She acknowledged and understood

## 2013-03-28 NOTE — Telephone Encounter (Signed)
Message copied by Jeanine Luz on Thu Mar 28, 2013  3:00 PM ------      Message from: Park Liter L      Created: Thu Mar 28, 2013  1:12 PM       Pt is calling to find out what time he needs to be at the hospital on Monday.  ------

## 2013-04-01 ENCOUNTER — Telehealth (HOSPITAL_COMMUNITY): Payer: Self-pay | Admitting: Radiology

## 2013-04-01 ENCOUNTER — Observation Stay (HOSPITAL_COMMUNITY)
Admission: RE | Admit: 2013-04-01 | Discharge: 2013-04-04 | Disposition: A | Discharge status: Home / Self Care | Payer: Medicare Other | Source: Ambulatory Visit | Attending: Internal Medicine | Admitting: Internal Medicine

## 2013-04-01 ENCOUNTER — Encounter (HOSPITAL_COMMUNITY): Payer: Self-pay

## 2013-04-01 DIAGNOSIS — M069 Rheumatoid arthritis, unspecified: Secondary | ICD-10-CM | POA: Insufficient documentation

## 2013-04-01 DIAGNOSIS — D126 Benign neoplasm of colon, unspecified: Secondary | ICD-10-CM

## 2013-04-01 DIAGNOSIS — K449 Diaphragmatic hernia without obstruction or gangrene: Secondary | ICD-10-CM | POA: Insufficient documentation

## 2013-04-01 DIAGNOSIS — Z87891 Personal history of nicotine dependence: Secondary | ICD-10-CM | POA: Insufficient documentation

## 2013-04-01 DIAGNOSIS — M1710 Unilateral primary osteoarthritis, unspecified knee: Secondary | ICD-10-CM

## 2013-04-01 DIAGNOSIS — K209 Esophagitis, unspecified without bleeding: Secondary | ICD-10-CM | POA: Insufficient documentation

## 2013-04-01 DIAGNOSIS — D509 Iron deficiency anemia, unspecified: Principal | ICD-10-CM | POA: Diagnosis present

## 2013-04-01 DIAGNOSIS — R195 Other fecal abnormalities: Secondary | ICD-10-CM | POA: Insufficient documentation

## 2013-04-01 DIAGNOSIS — E7800 Pure hypercholesterolemia, unspecified: Secondary | ICD-10-CM | POA: Insufficient documentation

## 2013-04-01 LAB — MRSA PCR SCREENING: MRSA by PCR: NEGATIVE

## 2013-04-01 MED ORDER — INFLUENZA VAC SPLIT QUAD 0.5 ML IM SUSP
0.5000 mL | INTRAMUSCULAR | Status: AC
  Administered 2013-04-02: 0.5 mL via INTRAMUSCULAR
  Filled 2013-04-01 (×2): qty 0.5

## 2013-04-01 MED ORDER — PEG-KCL-NACL-NASULF-NA ASC-C 100 G PO SOLR
0.5000 | Freq: Once | ORAL | Status: AC
  Administered 2013-04-01: 19:00:00 via ORAL
  Administered 2013-04-02: 100 g via ORAL
  Filled 2013-04-01: qty 1

## 2013-04-01 MED ORDER — OXYCODONE HCL 5 MG PO TABS
5.0000 mg | ORAL_TABLET | ORAL | Status: DC | PRN
  Administered 2013-04-02 – 2013-04-04 (×4): 5 mg via ORAL
  Filled 2013-04-01 (×4): qty 1

## 2013-04-01 MED ORDER — POTASSIUM CHLORIDE IN NACL 20-0.45 MEQ/L-% IV SOLN
INTRAVENOUS | Status: DC
  Administered 2013-04-01 – 2013-04-04 (×4): via INTRAVENOUS
  Filled 2013-04-01 (×6): qty 1000

## 2013-04-01 MED ORDER — ACETAMINOPHEN 325 MG PO TABS: 650.0000 mg | ORAL_TABLET | Freq: Four times a day (QID) | ORAL | Status: DC | PRN

## 2013-04-01 MED ORDER — OXYCODONE-ACETAMINOPHEN 10-325 MG PO TABS: 1.0000 | ORAL_TABLET | ORAL | Status: DC | PRN

## 2013-04-01 MED ORDER — OXYCODONE-ACETAMINOPHEN 5-325 MG PO TABS
1.0000 | ORAL_TABLET | ORAL | Status: DC | PRN
  Administered 2013-04-02 – 2013-04-03 (×2): 1 via ORAL
  Filled 2013-04-01 (×2): qty 1

## 2013-04-01 MED ORDER — SULFAMETHOXAZOLE-TMP DS 800-160 MG PO TABS
1.0000 | ORAL_TABLET | Freq: Two times a day (BID) | ORAL | Status: DC
  Administered 2013-04-01 – 2013-04-04 (×7): 1 via ORAL
  Filled 2013-04-01 (×8): qty 1

## 2013-04-01 MED ORDER — SODIUM CHLORIDE 0.9 % IV SOLN
250.0000 mL | INTRAVENOUS | Status: DC | PRN
  Administered 2013-04-03: 500 mL via INTRAVENOUS

## 2013-04-01 MED ORDER — ONDANSETRON HCL 4 MG/2ML IJ SOLN: 4.0000 mg | Freq: Four times a day (QID) | INTRAMUSCULAR | Status: DC | PRN

## 2013-04-01 MED ORDER — SODIUM CHLORIDE 0.9 % IJ SOLN
3.0000 mL | Freq: Two times a day (BID) | INTRAMUSCULAR | Status: DC
  Administered 2013-04-02: 3 mL via INTRAVENOUS

## 2013-04-01 MED ORDER — SODIUM CHLORIDE 0.9 % IJ SOLN: 3.0000 mL | INTRAMUSCULAR | Status: DC | PRN

## 2013-04-01 MED ORDER — PEG-KCL-NACL-NASULF-NA ASC-C 100 G PO SOLR
0.5000 | Freq: Once | ORAL | Status: AC
  Administered 2013-04-02: 100 g via ORAL
  Filled 2013-04-01: qty 1

## 2013-04-01 MED ORDER — PEG-KCL-NACL-NASULF-NA ASC-C 100 G PO SOLR: 1.0000 | Freq: Once | ORAL | Status: DC

## 2013-04-01 MED ORDER — ONDANSETRON HCL 4 MG PO TABS: 4.0000 mg | ORAL_TABLET | Freq: Four times a day (QID) | ORAL | Status: DC | PRN

## 2013-04-01 MED ORDER — CLINDAMYCIN HCL 150 MG PO CAPS
150.0000 mg | ORAL_CAPSULE | Freq: Two times a day (BID) | ORAL | Status: DC
  Administered 2013-04-01 – 2013-04-04 (×7): 150 mg via ORAL
  Filled 2013-04-01 (×8): qty 1

## 2013-04-01 MED ORDER — ACETAMINOPHEN 650 MG RE SUPP: 650.0000 mg | Freq: Four times a day (QID) | RECTAL | Status: DC | PRN

## 2013-04-01 NOTE — H&P (Signed)
Primary Care Physician:  Quitman Livings, MD Primary Gastroenterologist:  Dr. Marina Goodell  CHIEF COMPLAINT:  Anemia and heme positive stools  HPI: Jonathan [name] is a 52 y.o. male with RA and poor mobility to due his condition.  Ambulates with a walker at home but has a hard time doing so.  He was seen by Dr. Marina Goodell as an outpatient on 11/11 for microcytic anemia and heme positive stools.  Due to his limited mobility and help at home it was decided that he would be better served to be admitted for observation to undergo prep for EGD and colonoscopy procedures.  He comes in today with no complaints.  Last Hgb 12/2012 was 9.7 grams with MCV of 68.9.  Past Medical History  Diagnosis Date  . Arthritis     rheumatoid  . Hypercholesterolemia   . Anemia   . Depression   . Rheumatoid arthritis(714.0)   . Cellulitis     Past Surgical History  Procedure Laterality Date  . Knee arthroplasty  06/07/2011    Procedure: COMPUTER ASSISTED TOTAL KNEE ARTHROPLASTY;  Surgeon: Kathryne Hitch, MD;  Location: [facility] OR;  Service: Orthopedics;  Laterality: Right;  Right total knee arthoplasty  . Knee closed reduction  07/19/2011    Procedure: CLOSED MANIPULATION KNEE;  Surgeon: Kathryne Hitch, MD;  Location: [facility] OR;  Service: Orthopedics;  Laterality: Right;  Manipulation under anesthesia right knee  . Joint replacement      L TKA  also right in 05/2011    Prior to Admission medications   Medication Sig Start Date End Date Taking? Authorizing Provider  clindamycin (CLEOCIN) 150 MG capsule Take by mouth 2 (two) times daily.    Historical Provider, MD  ferrous gluconate (FERGON) 324 MG tablet Take 324 mg by mouth daily with breakfast.    Historical Provider, MD  oxyCODONE-acetaminophen (PERCOCET) 10-325 MG per tablet Take 1 tablet by mouth every 4 (four) hours as needed for pain.    Historical Provider, MD  sulfamethoxazole-trimethoprim (BACTRIM DS) 800-160 MG per tablet Take 1 tablet by mouth 2 (two) times  daily.    Historical Provider, MD    Current Facility-Administered Medications  Medication Dose Route Frequency Provider Last Rate Last Dose  . 0.45 % NaCl with KCl 20 mEq / L infusion   Intravenous Continuous Jessica D. Zehr, PA-C      . 0.9 %  sodium chloride infusion  250 mL Intravenous PRN Jessica D. Zehr, PA-C      . acetaminophen (TYLENOL) tablet 650 mg  650 mg Oral Q6H PRN Princella Pellegrini. Zehr, PA-C       Or  . acetaminophen (TYLENOL) suppository 650 mg  650 mg Rectal Q6H PRN Princella Pellegrini. Zehr, PA-C      . [START ON 04/02/2013] influenza vac split quadrivalent PF (FLUARIX) injection 0.5 mL  0.5 mL Intramuscular Tomorrow-1000 Hilarie Fredrickson, MD      . ondansetron [facility]) tablet 4 mg  4 mg Oral Q6H PRN Princella Pellegrini. Zehr, PA-C       Or  . ondansetron (ZOFRAN) injection 4 mg  4 mg Intravenous Q6H PRN Jessica D. Zehr, PA-C      . peg 3350 powder (MOVIPREP) kit 100 g  0.5 kit Oral Once Hilarie Fredrickson, MD       And  . Melene Muller ON 04/02/2013] peg 3350 powder (MOVIPREP) kit 100 g  0.5 kit Oral Once Hilarie Fredrickson, MD      . sodium chloride 0.9 % injection 3  mL  3 mL Intravenous Q12H Jessica D. Zehr, PA-C      . sodium chloride 0.9 % injection 3 mL  3 mL Intravenous PRN Jessica D. Zehr, PA-C        Allergies as of 03/05/2013  . (No Known Allergies)    Family History  Problem Relation Age of Onset  . Anesthesia problems Neg Hx   . Cancer Mother     ?    History   Social History  . Marital Status: Single    Spouse Name: N/A    Number of Children: N/A  . Years of Education: N/A   Occupational History  . Disabled    Social History Main Topics  . Smoking status: Former Smoker    Types: Cigarettes    Quit date: 04/25/2004  . Smokeless tobacco: Never Used  . Alcohol Use: No  . Drug Use: No  . Sexual Activity: No   Other Topics Concern  . Not on file   Social History Narrative  . No narrative on file    Review of Systems: Ten point ROS is O/W negative except as mentioned in  HPI.  Physical Exam: Vital signs in last 24 hours:     General:   Alert, chronically ill-appearing, pleasant and cooperative in NAD Head:  Normocephalic and atraumatic. Eyes:  Sclera clear, no icterus.  Conjunctiva pink. Ears:  Normal auditory acuity. Mouth:  No deformity or lesions.  Oropharynx pink & moist. Lungs:  Clear throughout to auscultation.  No wheezes, crackles,or rhonchi. Heart:  Regular rate and rhythm; no murmurs, clicks, rubs, or gallops. Abdomen:  Soft, non-distended.  BS present.  Non-tender. Rectal:  Deferred until time of colonoscopy.   Msk:  Symmetrical without gross deformities. Normal posture. Pulses:  Normal pulses noted. Extremities:  Without clubbing or edema. Neurologic:  Alert and  oriented x4;  grossly normal neurologically. Skin:  Intact without significant lesions or rashes on visible extremities. Psych:  Alert and cooperative. Normal mood and affect.  Impression / Plan: #1. Chronic microcytic anemia.  #2. Hemoccult-positive stool per PCP  #3. Rectal bleeding per patient. Minor intermittent  #4. History of RA with knee replacement. Quite limited mobility.  -Patient is being admitted for observation to undergo colonoscopy prepping due to limited mobility and help at home.  Will re-start home meds.  Clear liquids today and begin prep this evening.  EGD/colonoscopy tomorrow at 1:00pm.      LOS: 0 days   ZEHR, JESSICA D.  04/01/2013, 10:32 AM  GI ATTENDING  Patient admitted for workup of microcytic anemia and Hemoccult-positive stool. Has significant arthritis. Cannot prep as outpatient. Plan colonoscopy and upper endoscopy tomorrow afternoon.The nature of the procedure, as well as the risks, benefits, and alternatives were carefully and thoroughly reviewed with the patient. Ample time for discussion and questions allowed. The patient understood, was satisfied, and agreed to proceed.  Wilhemina Bonito. Eda Keys., M.D. [facility] Division of  Gastroenterology

## 2013-04-01 NOTE — Telephone Encounter (Signed)
Rec'd call from volunteer at front desk of [facility] asking if this patient had an appointment in Radiology.  No appointments have been scheduled, but there is a pending admission.  Instructed volunteer to send patient to [facility].

## 2013-04-02 ENCOUNTER — Encounter (HOSPITAL_COMMUNITY)
Admission: RE | Disposition: A | Discharge status: Home / Self Care | Payer: Self-pay | Source: Ambulatory Visit | Attending: Internal Medicine

## 2013-04-02 ENCOUNTER — Encounter (HOSPITAL_COMMUNITY): Payer: Medicare Other | Admitting: Anesthesiology

## 2013-04-02 ENCOUNTER — Observation Stay (HOSPITAL_COMMUNITY): Payer: Medicare Other | Admitting: Anesthesiology

## 2013-04-02 SURGERY — EGD (ESOPHAGOGASTRODUODENOSCOPY)
Anesthesia: Monitor Anesthesia Care

## 2013-04-02 MED ORDER — PEG-KCL-NACL-NASULF-NA ASC-C 100 G PO SOLR: 1.0000 | Freq: Once | ORAL | Status: DC

## 2013-04-02 MED ORDER — ONDANSETRON HCL 4 MG/2ML IJ SOLN
INTRAMUSCULAR | Status: AC
  Filled 2013-04-02: qty 2

## 2013-04-02 MED ORDER — GLYCOPYRROLATE 0.2 MG/ML IJ SOLN
INTRAMUSCULAR | Status: AC
  Filled 2013-04-02: qty 1

## 2013-04-02 MED ORDER — PROPOFOL 10 MG/ML IV BOLUS
INTRAVENOUS | Status: AC
  Filled 2013-04-02: qty 20

## 2013-04-02 MED ORDER — PEG-KCL-NACL-NASULF-NA ASC-C 100 G PO SOLR
0.5000 | Freq: Once | ORAL | Status: AC
  Administered 2013-04-03: 100 g via ORAL

## 2013-04-02 MED ORDER — PEG-KCL-NACL-NASULF-NA ASC-C 100 G PO SOLR
0.5000 | Freq: Once | ORAL | Status: AC
  Administered 2013-04-02: 100 g via ORAL
  Filled 2013-04-02: qty 1

## 2013-04-02 MED ORDER — LIDOCAINE HCL (CARDIAC) 20 MG/ML IV SOLN
INTRAVENOUS | Status: AC
  Filled 2013-04-02: qty 5

## 2013-04-02 MED ORDER — MAGNESIUM CITRATE PO SOLN
1.0000 | Freq: Once | ORAL | Status: AC
  Administered 2013-04-02: 1 via ORAL

## 2013-04-02 NOTE — Anesthesia Preprocedure Evaluation (Deleted)
Anesthesia Evaluation  Patient identified by MRN, date of birth, ID band Patient awake    Reviewed: Allergy & Precautions, H&P , NPO status , Patient's Chart, lab work & pertinent test results  Airway Mallampati: II TM Distance: >3 FB Neck ROM: Full    Dental no notable dental hx.    Pulmonary neg pulmonary ROS, former smoker,  breath sounds clear to auscultation  Pulmonary exam normal       Cardiovascular negative cardio ROS  Rhythm:Regular Rate:Normal     Neuro/Psych negative neurological ROS  negative psych ROS   GI/Hepatic negative GI ROS, Neg liver ROS,   Endo/Other  negative endocrine ROS  Renal/GU negative Renal ROS  negative genitourinary   Musculoskeletal  (+) Arthritis -, Rheumatoid disorders,    Abdominal   Peds negative pediatric ROS (+)  Hematology  (+) anemia ,   Anesthesia Other Findings   Reproductive/Obstetrics negative OB ROS                          Anesthesia Physical Anesthesia Plan  ASA: II  Anesthesia Plan: MAC   Post-op Pain Management:    Induction: Intravenous  Airway Management Planned: Nasal Cannula  Additional Equipment:   Intra-op Plan:   Post-operative Plan:   Informed Consent: I have reviewed the patients History and Physical, chart, labs and discussed the procedure including the risks, benefits and alternatives for the proposed anesthesia with the patient or authorized representative who has indicated his/her understanding and acceptance.   Dental advisory given  Plan Discussed with: CRNA and Surgeon  Anesthesia Plan Comments:         Anesthesia Quick Evaluation

## 2013-04-02 NOTE — Care Management Note (Signed)
    Page 1 of 1   04/02/2013     11:10:54 AM   CARE MANAGEMENT NOTE 04/02/2013  Patient:  Jonathan [name], Jonathan [name]   Account Number:  [id number]  Date Initiated:  04/02/2013  Documentation initiated by:  Lorenda Ishihara  Subjective/Objective Assessment:   52 yo male admitted with anemia, GI prep. PTA lived at home with cousin, aide from [employer].     Action/Plan:   Home when stable   Anticipated DC Date:  04/02/2013   Anticipated DC Plan:  HOME/SELF CARE      DC Planning Services  CM consult      PAC Choice  Resumption Of Svcs/PTA Provider   Choice offered to / List presented to:             Status of service:  Completed, signed off Medicare Important Message given?   (If response is "NO", the following Medicare IM given date fields will be blank) Date Medicare IM given:   Date Additional Medicare IM given:    Discharge Disposition:  HOME/SELF CARE  Per UR Regulation:  Reviewed for med. necessity/level of care/duration of stay  If discussed at Long Length of Stay Meetings, dates discussed:    Comments:

## 2013-04-02 NOTE — Progress Notes (Signed)
EGD and colonoscopy being post-poned until 12/10 at 9 AM due to incomplete colonoscopy prep.  Will give a bottle of magnesium citrate today and another kit of moviprep tonight/early tomorrow AM.

## 2013-04-02 NOTE — Progress Notes (Signed)
[facility] Progress Note  Subjective:  Has only two cups of Movi-prep left and still no BM.  No complaints.  Objective:  Vital signs in last 24 hours: Temp:  [98.2 F (36.8 C)-98.8 F (37.1 C)] 98.6 F (37 C) (12/09 1610) Pulse Rate:  [78-99] 99 (12/09 0623) Resp:  [18-20] 18 (12/09 0623) BP: (99-125)/(65-78) 99/65 mmHg (12/09 0623) SpO2:  [98 %-100 %] 98 % (12/09 0623) Weight:  [132 lb (59.875 kg)] 132 lb (59.875 kg) (12/08 2019)   General:  Alert, chronically ill-appearing, in NAD Heart:  Regular rate and rhythm; no murmurs Pulm:  CTAB.  No W/R/R. Abdomen:  Soft, non-distended.  BS present.  Non-tender. Extremities:  Without edema. Neurologic:  Alert and oriented x4;  grossly normal neurologically. Psych:  Alert and cooperative. Normal mood and affect.  Intake/Output from previous day: 12/08 0701 - 12/09 0700 In: 1661.7 [P.O.:780; I.V.:881.7] Out: 3200 [Urine:3200] Intake/Output this shift: Total I/O In: -  Out: 350 [Urine:350]  Assessment / Plan: #1. Chronic microcytic anemia #2. Hemoccult-positive stool per PCP  #3. Rectal bleeding per patient. Minor intermittent.  #4. History of RA with knee replacement. Lmited mobility.   -Tentatively for EGD and colonoscopy this afternoon around 1:00 pm.  Will give him two tap water enemas in addition to his moviprep.    LOS: 1 day   ZEHR, JESSICA D.  04/02/2013, 9:07 AM  Pager number [phone]   Discussed. Agree   Wilhemina Bonito. Eda Keys., M.D. [facility] Division of Gastroenterology

## 2013-04-03 ENCOUNTER — Encounter (HOSPITAL_COMMUNITY): Payer: Self-pay

## 2013-04-03 ENCOUNTER — Encounter (HOSPITAL_COMMUNITY)
Admission: RE | Disposition: A | Discharge status: Home / Self Care | Payer: Self-pay | Source: Ambulatory Visit | Attending: Internal Medicine

## 2013-04-03 DIAGNOSIS — D126 Benign neoplasm of colon, unspecified: Secondary | ICD-10-CM

## 2013-04-03 DIAGNOSIS — K2100 Gastro-esophageal reflux disease with esophagitis, without bleeding: Secondary | ICD-10-CM

## 2013-04-03 HISTORY — PX: ESOPHAGOGASTRODUODENOSCOPY: SHX5428

## 2013-04-03 HISTORY — PX: COLONOSCOPY: SHX5424

## 2013-04-03 SURGERY — EGD (ESOPHAGOGASTRODUODENOSCOPY)
Anesthesia: Moderate Sedation

## 2013-04-03 MED ORDER — FENTANYL CITRATE 0.05 MG/ML IJ SOLN
INTRAMUSCULAR | Status: DC | PRN
  Administered 2013-04-03 (×4): 25 ug via INTRAVENOUS

## 2013-04-03 MED ORDER — DIPHENHYDRAMINE HCL 50 MG/ML IJ SOLN
INTRAMUSCULAR | Status: AC
  Filled 2013-04-03: qty 1

## 2013-04-03 MED ORDER — MIDAZOLAM HCL 10 MG/2ML IJ SOLN
INTRAMUSCULAR | Status: DC | PRN
  Administered 2013-04-03: 1 mg via INTRAVENOUS
  Administered 2013-04-03 (×2): 2 mg via INTRAVENOUS
  Administered 2013-04-03 (×3): 1 mg via INTRAVENOUS
  Administered 2013-04-03: 2 mg via INTRAVENOUS

## 2013-04-03 MED ORDER — MIDAZOLAM HCL 10 MG/2ML IJ SOLN
INTRAMUSCULAR | Status: AC
  Filled 2013-04-03: qty 2

## 2013-04-03 MED ORDER — FENTANYL CITRATE 0.05 MG/ML IJ SOLN
INTRAMUSCULAR | Status: AC
  Filled 2013-04-03: qty 2

## 2013-04-03 MED ORDER — BUTAMBEN-TETRACAINE-BENZOCAINE 2-2-14 % EX AERO
INHALATION_SPRAY | CUTANEOUS | Status: DC | PRN
  Administered 2013-04-03: 2 via TOPICAL

## 2013-04-03 NOTE — Op Note (Signed)
[facility] [address] [place], [address]   ENDOSCOPY PROCEDURE REPORT  PATIENT: Jonathan [name], Jonathan [name]  MR#: [id number] BIRTHDATE: 06/25/1960 , 52  yrs. old GENDER: Male ENDOSCOPIST: Roxy Cedar, MD REFERRED BY:  Quitman Livings, M.D. PROCEDURE DATE:  04/03/2013 PROCEDURE:  EGD, diagnostic ASA CLASS:     Class III INDICATIONS:  Iron deficiency anemia.   Heme positive stool. MEDICATIONS: There was residual sedation effect present from prior procedure and Versed 2 mg IV TOPICAL ANESTHETIC: Cetacaine Spray  DESCRIPTION OF PROCEDURE: After the risks benefits and alternatives of the procedure were thoroughly explained, informed consent was obtained.  The Pentax Gastroscope [id number] endoscope was introduced through the mouth and advanced to the second portion of the duodenum. Without limitations.  The instrument was slowly withdrawn as the mucosa was fully examined.    EXAM:The esophagus revealed mild distal esophagitis.  The stomach was normal.  The duodenal bulb and post bulbar duodenum were normal.  Retroflexed views revealed a hiatal hernia.     The scope was then withdrawn from the patient and the procedure completed.  COMPLICATIONS: There were no complications. ENDOSCOPIC IMPRESSION: 1. Mild distal esophagitis. 2. Otherwise normal EGD  RECOMMENDATIONS: 1.  Anti-reflux regimen to be followed 2.  PPI as needed for symptomatic reflux 3. Daily iron supplementation 4. Followup with Dr. Roseanne Reno who can monitor blood counts and provide your overall general medical care. Anticipate discharge in a.m.Marland Kitchen  REPEAT EXAM:  eSigned:  Roxy Cedar, MD 04/03/2013 10:05 AM   NF:[id number], Sami MD and The Patient

## 2013-04-03 NOTE — H&P (View-Only) (Signed)
EGD and colonoscopy being post-poned until 12/10 at 9 AM due to incomplete colonoscopy prep.  Will give a bottle of magnesium citrate today and another kit of moviprep tonight/early tomorrow AM. 

## 2013-04-03 NOTE — Interval H&P Note (Signed)
History and Physical Interval Note:  04/03/2013 9:15 AM  Jonathan [name]  has presented today for surgery, with the diagnosis of Anemia and heme positive stools  The various methods of treatment have been discussed with the patient and family. After consideration of risks, benefits and other options for treatment, the patient has consented to  Procedure(s) with comments: ESOPHAGOGASTRODUODENOSCOPY (EGD) (N/A) - changed to moderate dr. Marina Goodell did not want to go to the o.r. for an endo colon-jmt COLONOSCOPY (N/A) as a surgical intervention .  The patient's history has been reviewed, patient examined, no change in status, stable for surgery.  I have reviewed the patient's chart and labs.  Questions were answered to the patient's satisfaction.     [name]

## 2013-04-03 NOTE — Op Note (Signed)
[facility] [address] [place], [address]   COLONOSCOPY PROCEDURE REPORT  PATIENT: Jonathan [name], Jonathan [name]  MR#: [id number] BIRTHDATE: 1960-08-01 , 52  yrs. old GENDER: Male ENDOSCOPIST: Roxy Cedar, MD REFERRED GE:XBMW Roseanne Reno, M.D. PROCEDURE DATE:  04/03/2013 PROCEDURE:   Colonoscopy with snare polypectomy x4 First Screening Colonoscopy - Avg.  risk and is 50 yrs.  old or older - No.  Prior Negative Screening - Now for repeat screening. N/A  History of Adenoma - Now for follow-up colonoscopy & has been > or = to 3 yrs.  N/A  Polyps Removed Today? Yes. ASA CLASS:   Class III INDICATIONS:Iron Deficiency Anemia and heme-positive stool. MEDICATIONS: Fentanyl 100 mcg IV and Versed 8 mg IV  DESCRIPTION OF PROCEDURE:   After the risks benefits and alternatives of the procedure were thoroughly explained, informed consent was obtained.  A digital rectal exam revealed no abnormalities of the rectum.   The [id number]  endoscope was introduced through the anus and advanced to the cecum, which was identified by both the appendix and ileocecal valve. No adverse events experienced.   The quality of the prep was good, using MoviPrep  The instrument was then slowly withdrawn as the colon was fully examined.  COLON FINDINGS: Four polyps were found: 10 mm sessile in the ascending colon,5 mm and 5 mm transverse colon, and 8 mm sigmoid colon.  A polypectomy was performed using snare cautery on the largest lesion. Others were removed with cold snare.  The resection was complete and the polyp tissue was completely retrieved.   The colon mucosa was otherwise normal.  Retroflexed views revealed internal hemorrhoids. The time to cecum=8 minutes 0 seconds. Withdrawal time=10 minutes 0 seconds.  The scope was withdrawn and the procedure completed. COMPLICATIONS: There were no complications.  ENDOSCOPIC IMPRESSION: 1.   Four polyps were found in the colon; polypectomy was  performed using snare cautery 2.   The colon mucosa was otherwise normal  RECOMMENDATIONS: 1.  Repeat Colonoscopy in 3 years. 2.  Upper endoscopy today. See report   eSigned:  Roxy Cedar, MD 04/03/2013 9:59 AM   cc: Quitman Livings MD and The Patient

## 2013-04-04 ENCOUNTER — Encounter (HOSPITAL_COMMUNITY): Payer: Self-pay | Admitting: Internal Medicine

## 2013-04-04 ENCOUNTER — Encounter: Payer: Self-pay | Admitting: Internal Medicine

## 2013-04-04 NOTE — Progress Notes (Signed)
[facility] Progress Note  Subjective:  Feels ok.  Concerned that he hasn't had a BM since yesterday.  Explained to him that he had a lot of stuff to clean him out and that he may not have another BM for a couple of days.  Says that his ride may not be able to come get him until later today because he is working.  Objective:  Vital signs in last 24 hours: Temp:  [97.7 F (36.5 C)-99.5 F (37.5 C)] 99.4 F (37.4 C) (12/11 0644) Pulse Rate:  [73-124] 95 (12/11 0644) Resp:  [13-23] 16 (12/11 0644) BP: (100-132)/(65-93) 120/76 mmHg (12/11 0644) SpO2:  [97 %-100 %] 98 % (12/11 0644) Last BM Date: 04/03/13 General:  Alert, chronically ill-appearing, in NAD Heart:  Regular rate and rhythm; no murmurs Pulm:  CTAB.  No W/R/R. Abdomen:  Soft, non-distended.  BS present.  Non-tender. Extremities:  Without edema. Neurologic:  Alert and  oriented x4;  grossly normal neurologically. Psych:  Alert and cooperative. Normal mood and affect.  Intake/Output from previous day: 12/10 0701 - 12/11 0700 In: 2329.3 [P.O.:480; I.V.:1849.3] Out: 1775 [Urine:1775]  Assessment / Plan: #1. Chronic microcytic anemia  #2. Hemoccult-positive stool per PCP.  #3. Rectal bleeding per patient. Minor intermittent.  #4. History of RA with knee replacement. Quite limited mobility.  #5 Mild distal esophagitis:  Seen on EGD 12/10.  Anti-reflux diet.  PPI as needed for symptomatic reflux. #6 Colon polyps:  Removed during colonoscopy 12/10.  Repeat colonoscopy in 3 years.  -Discharge today.  Follow-up with PCP who will monitor blood counts.  Continue iron supplements as prescribed by PCP.    LOS: 3 days   ZEHR, JESSICA D.  04/04/2013, 8:46 AM  Pager number [phone]   GI ATTENDING  Patient seen and examined. Rectal discharge. I reviewed pathology with him. He will resume general medical care with his PCP Dr. Roseanne Reno.  Wilhemina Bonito. Eda Keys., M.D. [facility] Division of Gastroenterology

## 2013-04-04 NOTE — Progress Notes (Signed)
Pt for discharge home today. IV d/c'd ,dressing applied. Tolerating diet without N/V as claimed. Ambulates to BR with assist. Discharge instructions given to pt with verbalized understanding. Pt's cousin will pick him up after work this afternoon as reported by pt. Pt denies of pain at this time.

## 2013-04-04 NOTE — Discharge Summary (Signed)
[facility] Discharge Summary  Name: Jonathan [name] MRN: [id number] DOB: 22-Mar-1961 52 y.o. PCP:  Quitman Livings, MD  Date of Admission: 04/01/2013  9:33 AM Date of Discharge: 04/04/2013 Attending Physician: Hilarie Fredrickson, MD  Discharge Diagnosis: Active Problems:   Microcytic anemia   Benign neoplasm of colon   Reflux esophagitis  Consultations:  None  Procedures Performed:  No results found.  GI Procedures: EGD by Dr. Marina Goodell on 12/10 showed mild esophagitis.  Colonoscopy on 12/10 showed four polyps that were removed.  History/Physical Exam:  See Admission H&P  Admission HPI:  Patient was admitted on 12/8 to undergo prep for colonoscopy and EGD.  He was to have procedure on 12/9, however, he drank the entire prep and never had a BM.  Was given two tap water enemas with large stool return just a couple of hours prior to scheduled procedure.  Procedures were post-poned to 12/10 and he was given a bottle of magnesium citrate and another movi-prep dose.  Procedures were performed on 12/10 with results listed above.  He was started on a regular diet post procedures and tolerated that well.  Feel well the morning of discharge.   Discharge Vitals:  BP 120/76  Pulse 95  Temp(Src) 99.4 F (37.4 C) (Oral)  Resp 16  Ht 5\' 4"  (1.626 m)  Wt 132 lb (59.875 kg)  BMI 22.65 kg/m2  SpO2 98%  Discharge Labs: No results found for this or any previous visit (from the past 24 hour(s)).  Disposition and follow-up:   Mr.Odean Walmer was discharged from [facility] in stable condition.    Follow-up Appointments:  Follow up with PCP within a couple of weeks and follow-up with Dr. Marina Goodell prn.  Repeat colonoscopy in 3 years.     Discharge Orders   Future Orders Complete By Expires   ABDOMINAL PROCEDURE/ANEURYSM REPAIR/AORTO-BIFEMORAL BYPASS:  Call MD for increased abdominal pain; cramping diarrhea; nausea/vomiting  As directed    Call MD for:  temperature >100.5  As  directed    Resume previous diet  As directed    Walk with assistance  As directed    Scheduling Instructions:     Uses walker.      Discharge Medications:   Medication List         clindamycin 150 MG capsule  Commonly known as:  CLEOCIN  Take by mouth 2 (two) times daily.     ferrous gluconate 324 MG tablet  Commonly known as:  FERGON  Take 324 mg by mouth daily with breakfast.     oxyCODONE-acetaminophen 10-325 MG per tablet  Commonly known as:  PERCOCET  Take 1 tablet by mouth every 4 (four) hours as needed for pain.     sulfamethoxazole-trimethoprim 800-160 MG per tablet  Commonly known as:  BACTRIM DS  Take 1 tablet by mouth 2 (two) times daily.        SignedCristi Loron, JESSICA D. 04/04/2013, 2:53 PM    GI  ATTENDING As above. See earlier note  Augie Vane N. Eda Keys., M.D. [facility] Division of Gastroenterology

## 2013-07-09 ENCOUNTER — Emergency Department (HOSPITAL_COMMUNITY): Payer: Medicare Other

## 2013-07-09 ENCOUNTER — Encounter (HOSPITAL_COMMUNITY): Payer: Self-pay | Admitting: Emergency Medicine

## 2013-07-09 ENCOUNTER — Emergency Department (HOSPITAL_COMMUNITY)
Admission: EM | Admit: 2013-07-09 | Discharge: 2013-07-09 | Disposition: A | Discharge status: Home / Self Care | Payer: Medicare Other | Attending: Emergency Medicine | Admitting: Emergency Medicine

## 2013-07-09 DIAGNOSIS — L0390 Cellulitis, unspecified: Secondary | ICD-10-CM

## 2013-07-09 DIAGNOSIS — M129 Arthropathy, unspecified: Secondary | ICD-10-CM | POA: Insufficient documentation

## 2013-07-09 DIAGNOSIS — Z87891 Personal history of nicotine dependence: Secondary | ICD-10-CM | POA: Insufficient documentation

## 2013-07-09 DIAGNOSIS — F329 Major depressive disorder, single episode, unspecified: Secondary | ICD-10-CM | POA: Insufficient documentation

## 2013-07-09 DIAGNOSIS — L0291 Cutaneous abscess, unspecified: Secondary | ICD-10-CM | POA: Insufficient documentation

## 2013-07-09 DIAGNOSIS — D649 Anemia, unspecified: Secondary | ICD-10-CM | POA: Insufficient documentation

## 2013-07-09 DIAGNOSIS — R112 Nausea with vomiting, unspecified: Secondary | ICD-10-CM | POA: Insufficient documentation

## 2013-07-09 DIAGNOSIS — E7800 Pure hypercholesterolemia, unspecified: Secondary | ICD-10-CM | POA: Insufficient documentation

## 2013-07-09 DIAGNOSIS — Z79899 Other long term (current) drug therapy: Secondary | ICD-10-CM | POA: Insufficient documentation

## 2013-07-09 DIAGNOSIS — E876 Hypokalemia: Secondary | ICD-10-CM | POA: Insufficient documentation

## 2013-07-09 DIAGNOSIS — M069 Rheumatoid arthritis, unspecified: Secondary | ICD-10-CM | POA: Insufficient documentation

## 2013-07-09 DIAGNOSIS — R197 Diarrhea, unspecified: Secondary | ICD-10-CM | POA: Insufficient documentation

## 2013-07-09 DIAGNOSIS — F3289 Other specified depressive episodes: Secondary | ICD-10-CM | POA: Insufficient documentation

## 2013-07-09 DIAGNOSIS — R109 Unspecified abdominal pain: Secondary | ICD-10-CM | POA: Insufficient documentation

## 2013-07-09 LAB — CBC WITH DIFFERENTIAL/PLATELET
BASOS ABS: 0 10*3/uL (ref 0.0–0.1)
Basophils Relative: 0 % (ref 0–1)
EOS PCT: 2 % (ref 0–5)
Eosinophils Absolute: 0 10*3/uL (ref 0.0–0.7)
HCT: 33.7 % — ABNORMAL LOW (ref 39.0–52.0)
Hemoglobin: 11.1 g/dL — ABNORMAL LOW (ref 13.0–17.0)
LYMPHS PCT: 26 % (ref 12–46)
Lymphs Abs: 0.6 10*3/uL — ABNORMAL LOW (ref 0.7–4.0)
MCH: 22.9 pg — ABNORMAL LOW (ref 26.0–34.0)
MCHC: 32.9 g/dL (ref 30.0–36.0)
MCV: 69.5 fL — ABNORMAL LOW (ref 78.0–100.0)
MONOS PCT: 8 % (ref 3–12)
Monocytes Absolute: 0.2 10*3/uL (ref 0.1–1.0)
NEUTROS PCT: 64 % (ref 43–77)
Neutro Abs: 1.6 10*3/uL — ABNORMAL LOW (ref 1.7–7.7)
PLATELETS: 283 10*3/uL (ref 150–400)
RBC: 4.85 MIL/uL (ref 4.22–5.81)
RDW: 16.7 % — AB (ref 11.5–15.5)
WBC: 2.4 10*3/uL — AB (ref 4.0–10.5)

## 2013-07-09 LAB — URINALYSIS, ROUTINE W REFLEX MICROSCOPIC
GLUCOSE, UA: NEGATIVE mg/dL
HGB URINE DIPSTICK: NEGATIVE
KETONES UR: 40 mg/dL — AB
Leukocytes, UA: NEGATIVE
Nitrite: NEGATIVE
PH: 5.5 (ref 5.0–8.0)
Protein, ur: NEGATIVE mg/dL
Specific Gravity, Urine: 1.023 (ref 1.005–1.030)
Urobilinogen, UA: 0.2 mg/dL (ref 0.0–1.0)

## 2013-07-09 LAB — COMPREHENSIVE METABOLIC PANEL
ALBUMIN: 3.4 g/dL — AB (ref 3.5–5.2)
ALK PHOS: 106 U/L (ref 39–117)
ALT: 10 U/L (ref 0–53)
AST: 29 U/L (ref 0–37)
BILIRUBIN TOTAL: 0.3 mg/dL (ref 0.3–1.2)
BUN: 9 mg/dL (ref 6–23)
CHLORIDE: 100 meq/L (ref 96–112)
CO2: 20 mEq/L (ref 19–32)
Calcium: 9.5 mg/dL (ref 8.4–10.5)
Creatinine, Ser: 0.52 mg/dL (ref 0.50–1.35)
GFR calc Af Amer: >90 mL/min (ref >90)
GFR calc non Af Amer: >90 mL/min (ref >90)
Glucose, Bld: 82 mg/dL (ref 70–99)
POTASSIUM: 3.1 meq/L — AB (ref 3.7–5.3)
SODIUM: 138 meq/L (ref 137–147)
TOTAL PROTEIN: 7.7 g/dL (ref 6.0–8.3)

## 2013-07-09 LAB — LIPASE, BLOOD: LIPASE: 40 U/L (ref 11–59)

## 2013-07-09 LAB — I-STAT CG4 LACTIC ACID, ED: Lactic Acid, Venous: 1 mmol/L (ref 0.5–2.2)

## 2013-07-09 MED ORDER — IOHEXOL 300 MG/ML  SOLN
25.0000 mL | Freq: Once | INTRAMUSCULAR | Status: AC | PRN
  Administered 2013-07-09: 25 mL via ORAL

## 2013-07-09 MED ORDER — ONDANSETRON HCL 4 MG PO TABS: 4.0000 mg | ORAL_TABLET | Freq: Four times a day (QID) | ORAL | Status: DC

## 2013-07-09 MED ORDER — SODIUM CHLORIDE 0.9 % IV BOLUS (SEPSIS)
1000.0000 mL | Freq: Once | INTRAVENOUS | Status: AC
  Administered 2013-07-09: 1000 mL via INTRAVENOUS

## 2013-07-09 MED ORDER — POTASSIUM CHLORIDE CRYS ER 20 MEQ PO TBCR
40.0000 meq | EXTENDED_RELEASE_TABLET | Freq: Once | ORAL | Status: AC
  Administered 2013-07-09: 40 meq via ORAL
  Filled 2013-07-09: qty 2

## 2013-07-09 MED ORDER — SODIUM CHLORIDE 0.9 % IV BOLUS (SEPSIS)
250.0000 mL | Freq: Once | INTRAVENOUS | Status: AC
  Administered 2013-07-09: 250 mL via INTRAVENOUS

## 2013-07-09 MED ORDER — MORPHINE SULFATE 4 MG/ML IJ SOLN
4.0000 mg | Freq: Once | INTRAMUSCULAR | Status: AC
  Administered 2013-07-09: 4 mg via INTRAVENOUS
  Filled 2013-07-09: qty 1

## 2013-07-09 MED ORDER — ONDANSETRON HCL 4 MG/2ML IJ SOLN
4.0000 mg | Freq: Once | INTRAMUSCULAR | Status: AC
  Administered 2013-07-09: 4 mg via INTRAVENOUS
  Filled 2013-07-09: qty 2

## 2013-07-09 MED ORDER — POTASSIUM CHLORIDE CRYS ER 20 MEQ PO TBCR: 20.0000 meq | EXTENDED_RELEASE_TABLET | Freq: Every day | ORAL | Status: DC

## 2013-07-09 MED ORDER — IOHEXOL 300 MG/ML  SOLN
100.0000 mL | Freq: Once | INTRAMUSCULAR | Status: AC | PRN
  Administered 2013-07-09: 100 mL via INTRAVENOUS

## 2013-07-09 NOTE — ED Notes (Signed)
Called lab to ensure able to pull Lipase from previous tube drawn. Was informed that they could.

## 2013-07-09 NOTE — ED Notes (Signed)
Reports mid lower abd pain x 1 week. Went to pcp on Thursday and started on flagyl with no relief. Having nausea, frequent urination and diarrhea. Denies vomiting. No acute distress noted at this time.

## 2013-07-09 NOTE — ED Notes (Signed)
Patient transported to CT 

## 2013-07-09 NOTE — ED Provider Notes (Signed)
CSN: [id number]     Arrival date & time 07/09/13  1345 History   First MD Initiated Contact with Patient 07/09/13 1739     Chief Complaint  Patient presents with  . Abdominal Pain     (Consider location/radiation/quality/duration/timing/severity/associated sxs/prior Treatment) The history is provided by the patient. No language interpreter was used.  Jonathan [name] is a is a 53 year old male with past medical history of arthritis, hypercholesterolemia, depression presenting to the ED with abdominal pain does been ongoing for the past week. Patient reported that the abdominal pain is localized to the Center the abdomen, lower portion described as a sharp pain that is intermittent. Stated that the pain does not radiate. Stated that he's been having diarrhea. Reported that the pain worsens with eating. Patient reported that he was seen and assessed by his primary care provider last week who prescribed since lateral, ferrous sulfate, oxycodone-stated that he's been taking the medication since last Monday with minimal relief. Denied nausea, vomiting, hematochezia, melena, chest pain, shortness of breath, difficulty breathing, urinary symptoms, chills, fever, diaphoresis. PCP Dr. Sheryle Hail  Past Medical History  Diagnosis Date  . Arthritis     rheumatoid  . Hypercholesterolemia   . Anemia   . Depression   . Rheumatoid arthritis(714.0)   . Cellulitis    Past Surgical History  Procedure Laterality Date  . Knee arthroplasty  06/07/2011    Procedure: COMPUTER ASSISTED TOTAL KNEE ARTHROPLASTY;  Surgeon: Mcarthur Rossetti, MD;  Location: [facility];  Service: Orthopedics;  Laterality: Right;  Right total knee arthoplasty  . Knee closed reduction  07/19/2011    Procedure: CLOSED MANIPULATION KNEE;  Surgeon: Mcarthur Rossetti, MD;  Location: [facility];  Service: Orthopedics;  Laterality: Right;  Manipulation under anesthesia right knee  . Joint replacement      L TKA  also right in 05/2011  .  Esophagogastroduodenoscopy N/A 04/03/2013    Procedure: ESOPHAGOGASTRODUODENOSCOPY (EGD);  Surgeon: Irene Shipper, MD;  Location: [facility];  Service: Endoscopy;  Laterality: N/A;  changed to moderate dr. Henrene Pastor did not want to go to the o.r. for an endo colon-jmt  . Colonoscopy N/A 04/03/2013    Procedure: COLONOSCOPY;  Surgeon: Irene Shipper, MD;  Location: [facility];  Service: Endoscopy;  Laterality: N/A;   Family History  Problem Relation Age of Onset  . Anesthesia problems Neg Hx   . Cancer Mother     ?   History  Substance Use Topics  . Smoking status: Former Smoker    Types: Cigarettes    Quit date: 04/25/2004  . Smokeless tobacco: Never Used  . Alcohol Use: No    Review of Systems  Constitutional: Negative for fever and chills.  Respiratory: Negative for chest tightness and shortness of breath.   Cardiovascular: Negative for chest pain.  Gastrointestinal: Positive for nausea, vomiting, abdominal pain and diarrhea. Negative for constipation, blood in stool and anal bleeding.  Genitourinary: Negative for decreased urine volume.  Neurological: Negative for dizziness and weakness.  All other systems reviewed and are negative.      Allergies  Review of patient's allergies indicates no known allergies.  Home Medications   Current Outpatient Rx  Name  Route  Sig  Dispense  Refill  . ferrous sulfate 325 (65 FE) MG tablet   Oral   Take 325 mg by mouth 2 (two) times daily with a meal.         . metroNIDAZOLE (FLAGYL) 500 MG tablet   Oral  Take 500 mg by mouth 2 (two) times daily.         Marland Kitchen oxyCODONE-acetaminophen (PERCOCET) 10-325 MG per tablet   Oral   Take 1 tablet by mouth every 8 (eight) hours as needed for pain.           BP 146/79  Pulse 61  Temp(Src) 98.1 F (36.7 C) (Oral)  Resp 20  SpO2 100% Physical Exam  Nursing note and vitals reviewed. Constitutional: He is oriented to person, place, and time. He appears well-developed and  well-nourished. No distress.  HENT:  Head: Normocephalic.  Mouth/Throat: Oropharynx is clear and moist. No oropharyngeal exudate.  Eyes: Conjunctivae and EOM are normal. Pupils are equal, round, and reactive to light. Right eye exhibits no discharge. Left eye exhibits no discharge.  Neck: Normal range of motion. Neck supple.  Cardiovascular: Normal rate, regular rhythm and normal heart sounds.  Exam reveals no friction rub.   No murmur heard. Pulses:      Radial pulses are 2+ on the right side, and 2+ on the left side.       Dorsalis pedis pulses are 2+ on the right side, and 2+ on the left side.  Pulmonary/Chest: Effort normal and breath sounds normal. No respiratory distress. He has no wheezes. He has no rales.  Abdominal: Soft. Bowel sounds are normal. There is tenderness. There is guarding.  Bowel sounds normal active in all 4 quadrants Soft upon palpation Discomfort upon palpation to the suprapubic region and left lower quadrant upon palpation Positive McBurney's point  Musculoskeletal: Normal range of motion.  Full ROM to upper and lower extremities without difficulty noted, negative ataxia noted.  Neurological: He is alert and oriented to person, place, and time. No cranial nerve deficit. He exhibits normal muscle tone. Coordination normal.  Skin: Skin is warm and dry. No rash noted. He is not diaphoretic. No erythema.  Psychiatric: He has a normal mood and affect. His behavior is normal. Thought content normal.    ED Course  Procedures (including critical care time)  Results for orders placed during the hospital encounter of 07/09/13  CBC WITH DIFFERENTIAL      Result Value Ref Range   WBC 2.4 (*) 4.0 - 10.5 K/uL   RBC 4.85  4.22 - 5.81 MIL/uL   Hemoglobin 11.1 (*) 13.0 - 17.0 g/dL   HCT 33.7 (*) 39.0 - 52.0 %   MCV 69.5 (*) 78.0 - 100.0 fL   MCH 22.9 (*) 26.0 - 34.0 pg   MCHC 32.9  30.0 - 36.0 g/dL   RDW 16.7 (*) 11.5 - 15.5 %   Platelets 283  150 - 400 K/uL    Neutrophils Relative % 64  43 - 77 %   Lymphocytes Relative 26  12 - 46 %   Monocytes Relative 8  3 - 12 %   Eosinophils Relative 2  0 - 5 %   Basophils Relative 0  0 - 1 %   Neutro Abs 1.6 (*) 1.7 - 7.7 K/uL   Lymphs Abs 0.6 (*) 0.7 - 4.0 K/uL   Monocytes Absolute 0.2  0.1 - 1.0 K/uL   Eosinophils Absolute 0.0  0.0 - 0.7 K/uL   Basophils Absolute 0.0  0.0 - 0.1 K/uL   RBC Morphology POLYCHROMASIA PRESENT     WBC Morphology ATYPICAL LYMPHOCYTES    COMPREHENSIVE METABOLIC PANEL      Result Value Ref Range   Sodium 138  137 - 147 mEq/L   Potassium 3.1 (*)  3.7 - 5.3 mEq/L   Chloride 100  96 - 112 mEq/L   CO2 20  19 - 32 mEq/L   Glucose, Bld 82  70 - 99 mg/dL   BUN 9  6 - 23 mg/dL   Creatinine, Ser 0.52  0.50 - 1.35 mg/dL   Calcium 9.5  8.4 - 10.5 mg/dL   Total Protein 7.7  6.0 - 8.3 g/dL   Albumin 3.4 (*) 3.5 - 5.2 g/dL   AST 29  0 - 37 U/L   ALT 10  0 - 53 U/L   Alkaline Phosphatase 106  39 - 117 U/L   Total Bilirubin 0.3  0.3 - 1.2 mg/dL   GFR calc non Af Amer >90  >90 mL/min   GFR calc Af Amer >90  >90 mL/min  URINALYSIS, ROUTINE W REFLEX MICROSCOPIC      Result Value Ref Range   Color, Urine YELLOW  YELLOW   APPearance CLEAR  CLEAR   Specific Gravity, Urine 1.023  1.005 - 1.030   pH 5.5  5.0 - 8.0   Glucose, UA NEGATIVE  NEGATIVE mg/dL   Hgb urine dipstick NEGATIVE  NEGATIVE   Bilirubin Urine SMALL (*) NEGATIVE   Ketones, ur 40 (*) NEGATIVE mg/dL   Protein, ur NEGATIVE  NEGATIVE mg/dL   Urobilinogen, UA 0.2  0.0 - 1.0 mg/dL   Nitrite NEGATIVE  NEGATIVE   Leukocytes, UA NEGATIVE  NEGATIVE  LIPASE, BLOOD      Result Value Ref Range   Lipase 40  11 - 59 U/L  I-STAT CG4 LACTIC ACID, ED      Result Value Ref Range   Lactic Acid, Venous 1.00  0.5 - 2.2 mmol/L    Labs Review Labs Reviewed  CBC WITH DIFFERENTIAL - Abnormal; Notable for the following:    WBC 2.4 (*)    Hemoglobin 11.1 (*)    HCT 33.7 (*)    MCV 69.5 (*)    MCH 22.9 (*)    RDW 16.7 (*)    Neutro  Abs 1.6 (*)    Lymphs Abs 0.6 (*)    All other components within normal limits  COMPREHENSIVE METABOLIC PANEL - Abnormal; Notable for the following:    Potassium 3.1 (*)    Albumin 3.4 (*)    All other components within normal limits  URINALYSIS, ROUTINE W REFLEX MICROSCOPIC - Abnormal; Notable for the following:    Bilirubin Urine SMALL (*)    Ketones, ur 40 (*)    All other components within normal limits  LIPASE, BLOOD  I-STAT CG4 LACTIC ACID, ED   Imaging Review Ct Abdomen Pelvis W Contrast  07/09/2013   CLINICAL DATA:  Pain.  EXAM: CT ABDOMEN AND PELVIS WITH CONTRAST  TECHNIQUE: Multidetector CT imaging of the abdomen and pelvis was performed using the standard protocol following bolus administration of intravenous contrast.  CONTRAST:  164mL OMNIPAQUE IOHEXOL 300 MG/ML  SOLN  COMPARISON:  None.  FINDINGS: Liver normal. Spleen normal. Pancreas normal. Pancreas normal. No biliary distention. Gallstone.  Adrenals normal. Prominent right renal cyst. Nonobstructive nephrolithiasis. Bladder is nondistended. Pelvic phleboliths. Mild prostate enlargement present.  Multiple prominent bilateral inguinal lymph nodes present. Largest measures up to 1.6 cm in short axis diameter and is in the right groin, image number 83/series 2. Shotty retroperitoneal lymph nodes noted. Abdominal aorta is normal in caliber with patent visceral vasculature.  Stool is present throughout the colon. No bowel distention. Appendix is not definitely identified. No inflammatory changes in  right or left lower quadrant. Stomach is nondistended. Esophagogastric and gastroduodenal regions are normal. No mesenteric mass is noted. Mesentery and bowel difficult to evaluate due to paucity of intra-abdominal fat.  Heart size normal. Lung bases clear. Abdominal wall is intact. No significant hernia. 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 Patient has a history of rheumatoid arthritis and rheumatoid  involvement of both hips cannot be excluded. Other arthropathies could also present in this fashion. SI joints intact. Mild degenerative changes lumbar spine. No evidence of ankylosis.  IMPRESSION: 1. Bilateral prominent inguinal lymph nodes are present. These may be pathologic. These could be related to adjacent severe bilateral hip erosive arthropathy. 2. 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 These changes may be related to patient's known rheumatoid arthritis.   Electronically Signed   By: Marcello Moores  Register   On: 07/09/2013 20:52     EKG Interpretation None      MDM   Final diagnoses:  None   Medications  sodium chloride 0.9 % bolus 1,000 mL (0 mLs Intravenous Stopped 07/09/13 1900)  iohexol (OMNIPAQUE) 300 MG/ML solution 25 mL (25 mLs Oral Contrast Given 07/09/13 1906)  iohexol (OMNIPAQUE) 300 MG/ML solution 100 mL (100 mLs Intravenous Contrast Given 07/09/13 2022)  ondansetron (ZOFRAN) injection 4 mg (4 mg Intravenous Given 07/09/13 2137)  potassium chloride SA (K-DUR,KLOR-CON) CR tablet 40 mEq (40 mEq Oral Given 07/09/13 2200)  morphine 4 MG/ML injection 4 mg (4 mg Intravenous Given 07/09/13 2141)  sodium chloride 0.9 % bolus 250 mL (0 mLs Intravenous Stopped 07/09/13 2200)   Filed Vitals:   07/09/13 2145 07/09/13 2200 07/09/13 2215 07/09/13 2233  BP: 124/74   146/79  Pulse:  85 93 61  Temp:      TempSrc:      Resp:      SpO2:  100% 100% 100%    Patient presenting to the ED with abdominal pain that has been ongoing for the past week. Stated that he's been having sharp intermittent pain localize the lower abdomen without radiation. Stated he's been having associated symptoms of diarrhea. Stated the pain worsens with eating. Reported that he was seen and assessed by his primary care provider last week and started on Flagyl, ferrous sulfate, oxycodone-reported that he's been taking this medication every day as prescribed. Reported that he started this medication  last Monday. Stated that the medication is not alleviating any of this pain. Alert and oriented. GCS 15. Heart rate and rhythm normal. Lungs clear to auscultation to upper and lower lobes bilaterally. Radial and DP pulses 2+ bilaterally. Bowel sounds normal active in all 4 quadrants, soft upon palpation-discomfort upon palpation to the suprapubic and left lower quadrant. Positive McBurney's point. CBC noted negative elevated white cell count and for blood cell 2.4 with negative left shift or leukocytosis. CMP noted mild low potassium of 3.1-negative acute findings to liver and kidney function. Lipase negative elevation. Urinalysis negative for nitrites or leukocytes-negative findings of infection. Lactic acid 1.00. CT abdomen and pelvis noted bilateral prominent inguinal lymph nodes that are present-these may be pathologic or could be related to adjacent severe bilateral hip erosive arthropathy-severe erosive arthropathy of bilateral hips with protrusio acetabula-maybe related with patient's known history of rheumatoid arthritis.  Doubt appendicitis. Doubt pancreatitis. Doubt diverticulitis. Doubt acute abdominal processes. Etiology of abdominal pain unknown. Denied chest pain, shortness of breath, difficulty breathing. Suspicion to be possible referred pain from hip arthropathy - suspicion to be possible referred rheumatoid arthritis. Patient able  to tolerate fluids and ate a [place] sandwich - with negative episodes of emesis or diarrhea while in ED setting. Patient stable, afebrile. Discharged patient. Referred patient to PCP, orthopedics, GI - recommended colonoscopy to be performed. Discussed with patient to discontinue the flagyl. Discussed with patient to continue to take pain medications as prescribed by primary care provider-discussed course, precautions, disposal technique. Discharged patient with potassium. Discussed with patient to rest and stay hydrated. Discussed with patient proper diet. Discussed  with patient to closely monitor symptoms and if symptoms are to worsen or change to report back to the ED - strict return instructions given.  Patient agreed to plan of care, understood, all questions answered.   Jamse Mead, PA-C 07/09/13 2346

## 2013-07-09 NOTE — ED Notes (Signed)
Pt given PO fluids, tolerated well. Denies n/v.

## 2013-07-09 NOTE — Discharge Instructions (Signed)
Please call your doctor for a followup appointment within 24-48 hours. When you talk to your doctor please let them know that you were seen in the emergency department and have them acquire all of your records so that they can discuss the findings with you and formulate a treatment plan to fully care for your new and ongoing problems. Please call and set-up an appointment with Dr. Sheryle Hail to be re-assessed - will need to get potassium level rechecked Please call and set-up an appointment with gastroenterology regarding abdominal pain  Please stop taking Flagyl Please take zofran as needed for nausea Please take potassium as prescribed Please rest and stay hydrated Please avoid food high in fat and grease - please stick with a lite diet for the next couple of days Please continue to monitor symptoms closely and if symptoms are to worsen or change (fever greater than 101, chills, chest pain, shortness of breath, difficulty breathing, numbness, tingling, nausea, vomiting, black for stools, bright red blood in the stools, decreased urination, decreased bowel movements, dizziness, sweating) please report back to the ED immediately   Abdominal Pain, Adult Many things can cause abdominal pain. Usually, abdominal pain is not caused by a disease and will improve without treatment. It can often be observed and treated at home. Your health care provider will do a physical exam and possibly order blood tests and X-rays to help determine the seriousness of your pain. However, in many cases, more time must pass before a clear cause of the pain can be found. Before that point, your health care provider may not know if you need more testing or further treatment. HOME CARE INSTRUCTIONS  Monitor your abdominal pain for any changes. The following actions may help to alleviate any discomfort you are experiencing:  Only take over-the-counter or prescription medicines as directed by your health care provider.  Do not  take laxatives unless directed to do so by your health care provider.  Try a clear liquid diet (broth, tea, or water) as directed by your health care provider. Slowly move to a bland diet as tolerated. SEEK MEDICAL CARE IF:  You have unexplained abdominal pain.  You have abdominal pain associated with nausea or diarrhea.  You have pain when you urinate or have a bowel movement.  You experience abdominal pain that wakes you in the night.  You have abdominal pain that is worsened or improved by eating food.  You have abdominal pain that is worsened with eating fatty foods. SEEK IMMEDIATE MEDICAL CARE IF:   Your pain does not go away within 2 hours.  You have a fever.  You keep throwing up (vomiting).  Your pain is felt only in portions of the abdomen, such as the right side or the left lower portion of the abdomen.  You pass bloody or black tarry stools. MAKE SURE YOU:  Understand these instructions.   Will watch your condition.   Will get help right away if you are not doing well or get worse.  Document Released: 01/19/2005 Document Revised: 01/30/2013 Document Reviewed: 12/19/2012 [facility] Patient Information 2014 [place].   Emergency Department Resource Guide 1) Find a Doctor and Pay Out of Pocket Although you won't have to find out who is covered by your insurance plan, it is a good idea to ask around and get recommendations. You will then need to call the office and see if the doctor you have chosen will accept you as a new patient and what types of options they  offer for patients who are self-pay. Some doctors offer discounts or will set up payment plans for their patients who do not have insurance, but you will need to ask so you aren't surprised when you get to your appointment.  2) Contact Your Local Health Department Not all health departments have doctors that can see patients for sick visits, but many do, so it is worth a call to see if yours does. If  you don't know where your local health department is, you can check in your phone book. The CDC also has a tool to help you locate your state's health department, and many state websites also have listings of all of their local health departments.  3) Find a [facility] If your illness is not likely to be very severe or complicated, you may want to try a walk in clinic. These are popping up all over the country in pharmacies, drugstores, and shopping centers. They're usually staffed by nurse practitioners or physician assistants that have been trained to treat common illnesses and complaints. They're usually fairly quick and inexpensive. However, if you have serious medical issues or chronic medical problems, these are probably not your best option.  No Primary Care Doctor: - Call Health Connect at  [phone] - they can help you locate a primary care doctor that  accepts your insurance, provides certain services, etc. - Physician Referral Service- [phone]  Chronic Pain Problems: Organization         Address  Phone   Notes  [facility]  [phone] Patients need to be referred by their primary care doctor.   Medication Assistance: Organization         Address  Phone   Notes  [facility] Medication [facility] [address] [place], [place] [address] [phone] --Must be a resident of [facility] -- Must have NO insurance coverage whatsoever (no Medicaid/ Medicare, etc.) -- The pt. MUST have a primary care doctor that directs their care regularly and follows them in the community   [facility]ist  [phone]   [employer]  [phone]    Agencies that provide inexpensive medical care: Organization         Address  Phone   Notes  [facility]  [phone]   [facility]    [phone]   [facility] [place], [place] [address] [phone]   [place] [address] [place] [phone]   Planned Parenthood    [phone]   [facility]    [phone]   [place] and [place] [address], [place] Phone:  [phone], Fax:  [phone] Hours of Operation:  9 am - 6 pm, M-F.  Also accepts Medicaid/Medicare and self-pay.  [facility] for [place], [address], [place] Phone: [phone], Fax: [phone]. Hours of Operation:  8:30 am - 5:30 pm, M-F.  Also accepts Medicaid and self-pay.  [facility] [address] [place] Phone: [phone]   [place], [place], [place] [phone], Ext. [phone] Mondays & Thursdays: 7-9 AM.  First 15 patients are seen on a first come, first serve basis.    [place] Providers:  Organization         Address  Phone   Notes  [facility] [address], [place] [phone] Also accepts self-pay patients.  [facility] [address], [place]  [phone]   [place], [address], [place] [phone]   [facility] [address], [place] [phone]   Lucianne Lei [address], [place]   [phone] Only accepts [place] Access [place] patients after they have their name applied to their card.   Self-Pay (no insurance) in [facility]:  Organization         Address  Phone   Notes  Sickle Cell Patients, [facility] [place] [phone]   [facility] [place] [phone]   [facility] [place]  [place], [place], [place] [phone]   [facility]/Dr. Osei-Bonsu  [address] [place] or [address], [place] [phone]  Phone number for both [place] and [place] locations is the same.  Urgent Medical and [facility] [address], [place] [phone]   [facility] [address], [place] or [address] [phone]   [facility] [address] [place] [phone], phone; [phone], fax Sees patients 1st and 3rd Saturday of every month.  Must not qualify for public or private insurance (i.e. Medicaid, Medicare, [place] Health Choice, Veterans' Benefits)  Household income should be no more than 200% of the poverty level The clinic cannot treat you if you are pregnant or think you are pregnant  Sexually transmitted diseases are not treated at the clinic.    Dental Care: Organization         Address  Phone  Notes  [facility] Department of [facility] [place] [phone] Accepts children up to age 35 who are enrolled in [place] or [place]; pregnant women with a Medicaid card; and children who have applied for Medicaid or [place] Health Choice, but were declined, whose parents can pay a reduced fee at time of service.  [facility] Department of [facility]  [address], [place] [phone] Accepts children up to age 36 who are enrolled in [place] or [place]; pregnant women with a Medicaid card; and children who have applied for Medicaid or [place] Health Choice, but were declined, whose parents can pay a reduced fee at time of service.  [facility] Adult Dental Access PROGRAM  [place] [phone] Patients are seen by appointment only. Walk-ins are not accepted. [place] will see patients 56 years of age and older. Monday - Tuesday (8am-5pm) Most Wednesdays (8:30-5pm) $30 per visit, cash only  [facility] Adult Dental Access PROGRAM  [address], [place] [phone] Patients are seen by appointment  only. Walk-ins are not accepted. [place] will see patients 80 years of age and older. One Wednesday Evening (Monthly: Volunteer Based).  $30 per visit, cash only  [place]  [phone] for adults; Children under age 20, call Graduate Pediatric Dentistry at [phone]. Children aged 87-14, please call [phone] to request a pediatric application.  Dental services are provided in all areas of dental care including fillings, crowns and bridges, complete and partial dentures, implants,  gum treatment, root canals, and extractions. Preventive care is also provided. Treatment is provided to both adults and children. Patients are selected via a lottery and there is often a waiting list.   [facility] [address], [place]  [phone] [email or web address]   [facility] [address] [place], [place] [phone], Ext. [phone] Second and Fourth Thursday of each month, opens at 6:30 AM; Clinic ends at 9 AM.  Patients are seen on a first-come first-served basis, and a limited number are seen during each clinic.   [facility]  [address] Hillard Danker [place], [place] [phone]   Eligibility Requirements You must have lived in [place], [place], or [place] counties for at least the last three months.   You cannot be eligible for state or federal sponsored [employer], including [employer], [place], or [employer].   You generally cannot be eligible for healthcare insurance through your employer.    How to apply: Eligibility screenings are held every Tuesday and Wednesday afternoon from 1:00 pm until 4:00 pm. You do not need an appointment for the interview!  [facility] [address], [place], [place]   [place]  [place] Department  [place]  [phone]    Behavioral Health  Resources in the Community: Intensive Outpatient Programs Organization         Address  Phone  Notes  [place] Slater. [address], [place], [place] [phone]   [facility] Outpatient [address] [place], [place]   ADS: Alcohol & Drug Svcs [address] [place], [place]   [place] [address]  [place], [place] or [phone]   Substance Abuse Resources Organization         Address  Phone  Notes  Alcohol and Drug Services  [phone]   [place]  [phone]   [facility]   Chinita Pester  [phone]   Residential & Outpatient Substance Abuse Program  [phone]   Psychological Services Organization         Address  Phone  Notes  [facility] [place]  [place]  [phone]   [place] [address], [place] or [phone]    Mobile Crisis Teams Organization         Address  Phone  Notes  [facility], Mobile Crisis Care Unit  [phone]   [facility]  [address] [place], [place]   [place] Levels [address] [place] [phone]    Self-Help/Support Groups Organization         Address  Phone             Notes  Archie. of [place] - variety of support groups  [place] Call for more information  Narcotics Anonymous (NA), [facility] [address], [employer] [place]  2 meetings at this location   Special educational needs teacher         Address  Phone  Notes  [facility]idential [facility] [place],    [place]  [place]  [address] [place], [place], [place]   [place], [place] [phone] Admissions: 8am-3pm M-F  Incentives Substance Abuse  Treatment Center [address],    [place], [place] [phone]   [facility] [address] [place], [place], [place]   [facility] [address]  [place], [place]   [facility] Programs - Intensive Outpatient [address] Kristeen [address], [place], [place]   [facility] ([place].) [place].,  [place], [place] [phone] or [phone]   [facility] (RTS) [address] [place], [place] Accepts Medicaid  [facility] [place] [address],  [place] [phone] Substance Abuse/Addiction Treatment   [employer] Organization         Address  Phone  Notes  [facility]  [phone]   Domenic Schwab, PhD [address] Arlis Porta [place], [place]   [phone] or [phone]   [place] Milam [place], [place] [phone]   [facility] [address], [place], [place] [phone] Insurance/Medicaid/sponsorship through [facility] and Families [address], [address]                                    [place], [place] [phone] [place] [address], [place] [phone]    Dr. Adele Schilder  [phone]   [facility] Dept. 1) [address] [place] 2) [place] 3)  [place] [address], [place] [phone]  [phone]   [place] [phone] or [phone] (After Hours)

## 2013-07-11 NOTE — ED Provider Notes (Signed)
Medical screening examination/treatment/procedure(s) were performed by non-physician practitioner and as supervising physician I was immediately available for consultation/collaboration.   EKG Interpretation None        Mervin Kung, MD 07/11/13 1153

## 2013-08-10 ENCOUNTER — Encounter (HOSPITAL_COMMUNITY): Payer: Self-pay | Admitting: Emergency Medicine

## 2013-08-10 ENCOUNTER — Emergency Department (HOSPITAL_COMMUNITY)
Admission: EM | Admit: 2013-08-10 | Discharge: 2013-08-10 | Disposition: A | Discharge status: Home / Self Care | Payer: Medicare Other | Attending: Emergency Medicine | Admitting: Emergency Medicine

## 2013-08-10 DIAGNOSIS — K089 Disorder of teeth and supporting structures, unspecified: Secondary | ICD-10-CM | POA: Insufficient documentation

## 2013-08-10 DIAGNOSIS — M19079 Primary osteoarthritis, unspecified ankle and foot: Secondary | ICD-10-CM | POA: Insufficient documentation

## 2013-08-10 DIAGNOSIS — K0889 Other specified disorders of teeth and supporting structures: Secondary | ICD-10-CM

## 2013-08-10 DIAGNOSIS — Z872 Personal history of diseases of the skin and subcutaneous tissue: Secondary | ICD-10-CM | POA: Insufficient documentation

## 2013-08-10 DIAGNOSIS — M7989 Other specified soft tissue disorders: Secondary | ICD-10-CM

## 2013-08-10 DIAGNOSIS — M1990 Unspecified osteoarthritis, unspecified site: Secondary | ICD-10-CM

## 2013-08-10 DIAGNOSIS — D649 Anemia, unspecified: Secondary | ICD-10-CM | POA: Insufficient documentation

## 2013-08-10 DIAGNOSIS — Z862 Personal history of diseases of the blood and blood-forming organs and certain disorders involving the immune mechanism: Secondary | ICD-10-CM | POA: Insufficient documentation

## 2013-08-10 DIAGNOSIS — Z8639 Personal history of other endocrine, nutritional and metabolic disease: Secondary | ICD-10-CM | POA: Insufficient documentation

## 2013-08-10 DIAGNOSIS — Z8659 Personal history of other mental and behavioral disorders: Secondary | ICD-10-CM | POA: Insufficient documentation

## 2013-08-10 DIAGNOSIS — K029 Dental caries, unspecified: Secondary | ICD-10-CM | POA: Insufficient documentation

## 2013-08-10 DIAGNOSIS — M069 Rheumatoid arthritis, unspecified: Secondary | ICD-10-CM | POA: Insufficient documentation

## 2013-08-10 DIAGNOSIS — Z87891 Personal history of nicotine dependence: Secondary | ICD-10-CM | POA: Insufficient documentation

## 2013-08-10 MED ORDER — PREDNISONE 20 MG PO TABS: ORAL_TABLET | ORAL | Status: DC

## 2013-08-10 MED ORDER — PREDNISONE 20 MG PO TABS
60.0000 mg | ORAL_TABLET | Freq: Once | ORAL | Status: AC
  Administered 2013-08-10: 60 mg via ORAL
  Filled 2013-08-10: qty 3

## 2013-08-10 MED ORDER — PENICILLIN V POTASSIUM 500 MG PO TABS: 500.0000 mg | ORAL_TABLET | Freq: Three times a day (TID) | ORAL | Status: DC

## 2013-08-10 NOTE — ED Notes (Signed)
Pt presents with c/o swelling feet R>L.  Pt also c/o dental pain.  Pt alert and oriented and in NAD.  Pt denies SOB.

## 2013-08-10 NOTE — Discharge Instructions (Signed)
Please read and follow all provided instructions.  Your diagnoses today include:  1. Arthritis   2. Foot swelling   3. Pain, dental    Tests performed today include:  Vital signs. See below for your results today.   Medications prescribed:   Prednisone - steroid medicine   It is best to take this medication in the morning to prevent sleeping problems. If you are diabetic, monitor your blood sugar closely and stop taking Prednisone if blood sugar is over 300. Take with food to prevent stomach upset.    Penicillin - antibiotic  You have been prescribed an antibiotic medicine: take the entire course of medicine even if you are feeling better. Stopping early can cause the antibiotic not to work.  Take any prescribed medications only as directed.  Home care instructions:   Follow any educational materials contained in this packet  Follow R.I.C.E. Protocol:  R - rest your injury   I  - use ice on injury without applying directly to skin  C - compress injury with bandage or splint  E - elevate the injury as much as possible  Follow-up instructions: Please follow-up with your primary care provider or the provided orthopedic physician (bone specialist) if you continue to have significant pain or trouble walking in 1 week. In this case you may have a severe injury that requires further care.   Please follow-up with your dentist for further evaluation of your symptoms. If you do not have a dentist or primary care doctor -- see below for referral information.   Return instructions:   Please return if your toes are numb or tingling, appear gray or blue, or you have severe pain  Please return to the Emergency Department if you experience worsening symptoms.   Please return if you have any other emergent concerns.  Please return to the Emergency Department if you experience worsening symptoms.  Please return if you develop a fever, you develop more swelling in your face or neck,  you have trouble breathing or swallowing food.  Please return if you have any other emergent concerns.  Dental Assistance: See below for dental referrals  Additional Information:  Your vital signs today were: BP 113/73   Pulse 79   Temp(Src) 98.3 F (36.8 C) (Oral)   Resp 20   SpO2 95% If your blood pressure (BP) was elevated above 135/85 this visit, please have this repeated by your doctor within one month. -------------- Dental Care: Organization         Address  Phone  Notes  [facility] Department of [facility] [place] [phone] Accepts children up to age 70 who are enrolled in [place] or [place]; pregnant women with a Medicaid card; and children who have applied for Medicaid or [place] Health Choice, but were declined, whose parents can pay a reduced fee at time of service.  [facility] Department of [facility]  [address], [place] [phone] Accepts children up to age 43 who are enrolled in [place] or [place]; pregnant women with a Medicaid card; and children who have applied for Medicaid or [place] Health Choice, but were declined, whose parents can pay a reduced fee at time of service.  [place] Adult Dental Access PROGRAM  [place] [phone] Patients are seen by appointment only. Walk-ins are not accepted. [place] will see patients 48 years of age and older. Monday -  Tuesday (8am-5pm) Most Wednesdays (8:30-5pm) $30 per visit, cash only  [facility] Adult Dental Access PROGRAM  [address], [facility] [phone] Patients are seen by appointment only. Walk-ins are not accepted. [place] will see patients 24 years of age and older. One Wednesday Evening (Monthly: Volunteer Based).  $30 per visit, cash only  [place]  [phone] for adults; Children under age 94, call Graduate Pediatric Dentistry at [phone]. Children aged 63-14, please call [phone] to request a pediatric application.  Dental services are provided in all areas of dental care including fillings, crowns and bridges, complete and partial dentures, implants, gum treatment, root canals, and extractions. Preventive care is also provided. Treatment is provided to both adults and children. Patients are selected via a lottery and there is often a waiting list.   [facility] [address], [employer]  [phone] [email or web address]   [facility] [address] [place], [place] [phone], Ext. [phone] Second and Fourth Thursday of each month, opens at 6:30 AM; Clinic ends at 9 AM.  Patients are seen on a first-come first-served basis, and a limited number are seen during each clinic.   [facility]  [address] Hillard Danker [place], [place] [phone]   Eligibility Requirements You must have lived in [place], [place], or [place] counties for at least the last three months.   You cannot be eligible for state or federal sponsored [employer], including [employer], [place], or [employer].   You generally cannot be eligible for healthcare insurance through your employer.    How to apply: Eligibility screenings are held every Tuesday and Wednesday afternoon from 1:00 pm until 4:00 pm. You do not need an appointment for the interview!  [facility] [address], [employer], [place]   [place]  [place]  [place]  [phone]

## 2013-08-10 NOTE — ED Provider Notes (Signed)
CSN: [id number]     Arrival date & time 08/10/13  1304 History   First MD Initiated Contact with Patient 08/10/13 1326     Chief Complaint  Patient presents with  . Foot Swelling  . Dental Pain     (Consider location/radiation/quality/duration/timing/severity/associated sxs/prior Treatment) HPI Comments: Patient with history of rheumatoid arthritis, right knee arthroplasty in 2013 by Dr. Ninfa Linden -- presents with complaint of bilateral swelling of feet and foot pain. Patient states this has been ongoing for a long time. He is unable to say how long. He states that his feet are hurting so much that he can hardly walk on them anymore. He denies new injuries. He takes Percocet 10-325 at home for pain. Patient denies liver or kidney problems. He denies shortness of breath or swelling of the remainder of his legs. He denies redness or warmth. He denies fever, nausea or vomiting. He is not currently on any treatment for rheumatoid arthritis. Patient also complains of several days of right lower dental pain. He states that it feels like he has had some mild swelling over his jaw. No difficulty swallowing or breathing.   The history is provided by the patient and medical records.    Past Medical History  Diagnosis Date  . Arthritis     rheumatoid  . Hypercholesterolemia   . Anemia   . Depression   . Rheumatoid arthritis(714.0)   . Cellulitis    Past Surgical History  Procedure Laterality Date  . Knee arthroplasty  06/07/2011    Procedure: COMPUTER ASSISTED TOTAL KNEE ARTHROPLASTY;  Surgeon: Mcarthur Rossetti, MD;  Location: [facility];  Service: Orthopedics;  Laterality: Right;  Right total knee arthoplasty  . Knee closed reduction  07/19/2011    Procedure: CLOSED MANIPULATION KNEE;  Surgeon: Mcarthur Rossetti, MD;  Location: [facility];  Service: Orthopedics;  Laterality: Right;  Manipulation under anesthesia right knee  . Joint replacement      L TKA  also right in 05/2011  .  Esophagogastroduodenoscopy N/A 04/03/2013    Procedure: ESOPHAGOGASTRODUODENOSCOPY (EGD);  Surgeon: Irene Shipper, MD;  Location: [facility];  Service: Endoscopy;  Laterality: N/A;  changed to moderate dr. Henrene Pastor did not want to go to the o.r. for an endo colon-jmt  . Colonoscopy N/A 04/03/2013    Procedure: COLONOSCOPY;  Surgeon: Irene Shipper, MD;  Location: [facility];  Service: Endoscopy;  Laterality: N/A;   Family History  Problem Relation Age of Onset  . Anesthesia problems Neg Hx   . Cancer Mother     ?   History  Substance Use Topics  . Smoking status: Former Smoker    Types: Cigarettes    Quit date: 04/25/2004  . Smokeless tobacco: Never Used  . Alcohol Use: No    Review of Systems  Constitutional: Negative for fever and activity change.  HENT: Positive for dental problem. Negative for ear pain, facial swelling, sore throat and trouble swallowing.   Respiratory: Negative for shortness of breath and stridor.   Cardiovascular: Negative for leg swelling.  Musculoskeletal: Positive for arthralgias and myalgias. Negative for back pain, gait problem, joint swelling and neck pain.  Skin: Negative for color change and wound.  Neurological: Negative for weakness, numbness and headaches.      Allergies  Review of patient's allergies indicates no known allergies.  Home Medications   Prior to Admission medications   Medication Sig Start Date End Date Taking? Authorizing Provider  ferrous sulfate 325 (65 FE) MG tablet  Take 325 mg by mouth 2 (two) times daily with a meal.    Historical Provider, MD  metroNIDAZOLE (FLAGYL) 500 MG tablet Take 500 mg by mouth 2 (two) times daily.    Historical Provider, MD  ondansetron (ZOFRAN) 4 MG tablet Take 1 tablet (4 mg total) by mouth every 6 (six) hours. 07/09/13   Marissa Sciacca, PA-C  oxyCODONE-acetaminophen (PERCOCET) 10-325 MG per tablet Take 1 tablet by mouth every 8 (eight) hours as needed for pain.     Historical Provider, MD   potassium chloride SA (K-DUR,KLOR-CON) 20 MEQ tablet Take 1 tablet (20 mEq total) by mouth daily. 07/09/13   Marissa Sciacca, PA-C   BP 95/67  Pulse 77  Temp(Src) 98.3 F (36.8 C) (Oral)  Resp 20  SpO2 97% Physical Exam  Nursing note and vitals reviewed. Constitutional: He appears well-developed and well-nourished.  HENT:  Head: Normocephalic and atraumatic.  Right Ear: Tympanic membrane, external ear and ear canal normal.  Left Ear: Tympanic membrane, external ear and ear canal normal.  Nose: Nose normal.  Mouth/Throat: Uvula is midline, oropharynx is clear and moist and mucous membranes are normal. No trismus in the jaw. Abnormal dentition. Dental caries present. No dental abscesses or uvula swelling. No tonsillar abscesses.  Patient with advanced periodontal disease, several missing teeth. Severe gingivitis. No apparent abscess in area of pain, which is over right mandibular premolars.  Eyes: Conjunctivae are normal. Pupils are equal, round, and reactive to light.  Neck: Normal range of motion. Neck supple.  No neck swelling or Lugwig's angina  Cardiovascular: Normal rate and normal pulses.   No murmur heard. Pulses:      Radial pulses are 2+ on the right side, and 2+ on the left side.       Dorsalis pedis pulses are 2+ on the right side, and 2+ on the left side.       Posterior tibial pulses are 2+ on the right side, and 2+ on the left side.  Pulmonary/Chest: Effort normal and breath sounds normal. No respiratory distress. He has no wheezes. He has no rales.  Abdominal: Soft. There is no tenderness.  Musculoskeletal: He exhibits edema and tenderness.  Bony changes consistent with rheumatoid arthritis of bilateral hands. Mild swelling tenderness of bilateral feet. Tenderness is worse over plantar fascia. He also has tenderness over the forefoot bilaterally. There is no redness or warmth to suggest cellulitis. There is no MTP erythema or warmth to suggest gout. 2+ pedal pulses  bilaterally. Patient with full range of motion of foot and ankle without pain. Calves and remainder of legs are not swollen.  Neurological: He is alert. No sensory deficit.  Motor, sensation, and vascular distal to the injury is fully intact.   Skin: Skin is warm and dry.  Psychiatric: He has a normal mood and affect.    ED Course  Procedures (including critical care time) Labs Review Labs Reviewed - No data to display  Imaging Review No results found.   EKG Interpretation None      2:06 PM Patient seen and examined. Medications ordered.   Vital signs reviewed and are as follows: Filed Vitals:   08/10/13 1341  BP: 95/67  Pulse: 77  Temp:   Resp:   BP 113/73  Pulse 79  Temp(Src) 98.3 F (36.8 C) (Oral)  Resp 20  SpO2 95%  Patient urged to followup with his primary care physician orthopedist in the next week for further evaluation of his joint pain. Suspect this is  related to arthritis, either OA or rheumatoid. Patient does not have any factors that would for clue use of prednisone, will give trial as he is not currently on any medication for rheumatoid arthritis. Patient has Percocet at home for pain.  Penicillin given for possible dental infection. Patient counseled to take prescribed medications as directed, return with worsening facial or neck swelling, and to follow-up with their dentist as soon as possible.     MDM   Final diagnoses:  Arthritis  Foot swelling  Pain, dental   Foot swelling/pain: Suspect arthritic changes. No evidence of gout or cellulitis. No signs of septic arthritis. Remainder of legs are not swollen. Do not suspect CHF, liver disease, nephrotic syndrome. Do not suspect bilateral DVT. Do not suspect arterial insufficiency as patient has normal pulses. Prednisone as above. PCP followup for evaluation and response to therapy, further management. Patient also has an orthopedist.  Patient with toothache. No fever. Exam unconcerning for Ludwig's  angina or other deep tissue infection in neck.   As there is gum swelling, erythema will treat with antibiotic and pain medicine. Urged patient to follow-up with dentist.        Carlisle Cater, PA-C 08/10/13 1452

## 2013-08-11 NOTE — ED Provider Notes (Signed)
Medical screening examination/treatment/procedure(s) were performed by non-physician practitioner and as supervising physician I was immediately available for consultation/collaboration.   EKG Interpretation None       Tasmia Blumer R. Solangel Mcmanaway, MD 08/11/13 1054 

## 2013-08-21 ENCOUNTER — Encounter (INDEPENDENT_AMBULATORY_CARE_PROVIDER_SITE_OTHER): Payer: Self-pay

## 2013-08-21 ENCOUNTER — Ambulatory Visit (INDEPENDENT_AMBULATORY_CARE_PROVIDER_SITE_OTHER): Payer: Medicare Other | Admitting: Diagnostic Neuroimaging

## 2013-08-21 ENCOUNTER — Encounter: Payer: Self-pay | Admitting: Diagnostic Neuroimaging

## 2013-08-21 VITALS — BP 123/74 | HR 59 | Temp 98.2°F | Ht 64.0 in

## 2013-08-21 DIAGNOSIS — H5500 Unspecified nystagmus: Secondary | ICD-10-CM

## 2013-08-21 DIAGNOSIS — M25579 Pain in unspecified ankle and joints of unspecified foot: Secondary | ICD-10-CM

## 2013-08-21 DIAGNOSIS — M79609 Pain in unspecified limb: Secondary | ICD-10-CM

## 2013-08-21 DIAGNOSIS — M069 Rheumatoid arthritis, unspecified: Secondary | ICD-10-CM

## 2013-08-21 DIAGNOSIS — M6281 Muscle weakness (generalized): Secondary | ICD-10-CM

## 2013-08-21 DIAGNOSIS — R269 Unspecified abnormalities of gait and mobility: Secondary | ICD-10-CM

## 2013-08-21 NOTE — Progress Notes (Signed)
[facility]  PATIENT: Jonathan [name] DOB: [birth year]  REFERRING CLINICIAN: Sheryle Hail HISTORY FROM: patient and friend REASON FOR VISIT: new consult   HISTORICAL  CHIEF COMPLAINT:  Chief Complaint  Patient presents with  . LE WEAKNESS/ATROPHY    HISTORY OF PRESENT ILLNESS:   53 year old male with history of osteoarthritis and rheumatoid arthritis, here for evaluation of lower extremity weakness and pain.  Patient has had arthritis type symptoms since 2005, diagnosed with rheumatoid arthritis in 2010 in [place]. He was treated with some rheumatoid arthritis medication in the past but doesn't remember the name. Since that time he has not been on treatment for RA. Review of the chart and referring records mentions that the diagnosis of rheumatoid arthritis may not be correct and rather patient has osteoarthritis only. Patient has had progressive gait deterioration, joint swelling, joint pain since 2005. Over the past 2-3 years patient has been using a cane and walker. 2000 and patient had left knee replacement in [place] with fairly good results. 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 Since that time patient has had significant gait deterioration. Patient spends most of his time sitting down. He is able to invalid short distance using a cane and walker. Patient also has right shoulder pain, bilateral upper extremity pain and weakness. He has not seen rheumatology recently.   REVIEW OF SYSTEMS: Full 14 system review of systems performed and notable only for weight loss fatigue swelling in legs blurred vision joint pain too much sleep weakness restless legs.   ALLERGIES: No Known Allergies  HOME MEDICATIONS: Outpatient Prescriptions Prior to Visit  Medication Sig Dispense Refill  . ferrous sulfate 325 (65 FE) MG tablet Take  325 mg by mouth 2 (two) times daily with a meal.      . oxyCODONE-acetaminophen (PERCOCET) 10-325 MG per tablet Take 1 tablet by mouth every 8 (eight) hours as needed for pain.       Marland Kitchen penicillin v potassium (VEETID) 500 MG tablet Take 1 tablet (500 mg total) by mouth 3 (three) times daily.  21 tablet  0  . predniSONE (DELTASONE) 20 MG tablet 3 Tabs PO Days 1-3, then 2 tabs PO Days 4-6, then 1 tab PO Day 7-9, then Half Tab PO Day 10-12  20 tablet  0  . potassium chloride SA (K-DUR,KLOR-CON) 20 MEQ tablet Take 1 tablet (20 mEq total) by mouth daily.  3 tablet  0   No facility-administered medications prior to visit.    PAST MEDICAL HISTORY: Past Medical History  Diagnosis Date  . Arthritis     rheumatoid  . Hypercholesterolemia   . Anemia   . Depression   . Rheumatoid arthritis(714.0)   . Cellulitis     PAST SURGICAL HISTORY: Past Surgical History  Procedure Laterality Date  . Knee arthroplasty  06/07/2011    Procedure: COMPUTER ASSISTED TOTAL KNEE ARTHROPLASTY;  Surgeon: Mcarthur Rossetti, MD;  Location: [facility];  Service: Orthopedics;  Laterality: Right;  Right total knee arthoplasty  . Knee closed reduction  07/19/2011    Procedure: CLOSED MANIPULATION KNEE;  Surgeon: Mcarthur Rossetti, MD;  Location: [facility];  Service: Orthopedics;  Laterality: Right;  Manipulation under anesthesia right knee  . Joint replacement      L TKA  also right in 05/2011  . Esophagogastroduodenoscopy N/A 04/03/2013    Procedure: ESOPHAGOGASTRODUODENOSCOPY (EGD);  Surgeon: Irene Shipper, MD;  Location: [facility];  Service: Endoscopy;  Laterality: N/A;  changed to moderate dr. Henrene Pastor did not want to go to the o.r. for an endo colon-jmt  . Colonoscopy N/A 04/03/2013    Procedure: COLONOSCOPY;  Surgeon: Irene Shipper, MD;  Location: [facility];  Service: Endoscopy;  Laterality: N/A;    FAMILY HISTORY: Family History  Problem Relation Age of Onset  . Anesthesia problems Neg Hx   . Cancer Mother      ?  Marland Kitchen Heart failure Father   . Hypertension Mother   . Cancer Brother     SOCIAL HISTORY:  History   Social History  . Marital Status: Single    Spouse Name: N/A    Number of Children: N/A  . Years of Education: N/A   Occupational History  . Disabled    Social History Main Topics  . Smoking status: Former Smoker    Types: Cigarettes    Quit date: 04/25/2004  . Smokeless tobacco: Never Used  . Alcohol Use: No  . Drug Use: No  . Sexual Activity: No   Other Topics Concern  . Not on file   Social History Narrative  . No narrative on file     PHYSICAL EXAM  Filed Vitals:   08/21/13 1000  BP: 123/74  Pulse: 59  Temp: 98.2 F (36.8 C)  Height: 5\' 4"  (1.626 m)    Not recorded    Body mass index is 0.00 kg/(m^2).  GENERAL EXAM: Patient is in no distress; FRAIL APPEARING. SLIGHTLY UNKEMPT. ALMOST COMPLETELY EDENTULOUS. Neck is supple. SITTING IN WHEEL CHAIR.  CARDIOVASCULAR: Regular rate and rhythm, no murmurs, no carotid bruits  NEUROLOGIC: MENTAL STATUS: awake, alert, oriented to person, recent and remote memory POOR, normal attention and concentration, DECR FLUENCY, COMP INTACT, fund of knowledge appropriate CRANIAL NERVE: no papilledema on fundoscopic exam, pupils equal and reactive to light, visual fields full to confrontation, extraocular muscles NOTABLE FOR EXOTROPIA WITH ENDGAZE NYSTAGMUS, facial sensation and strength symmetric, hearing intact, palate elevates symmetrically, uvula midline, shoulder shrug symmetric, tongue midline. MOTOR: DECR ROM IN BILATERAL SHOULDERS AND HIPS. INCR TONE IN BLE. RUE (DELT 2-3, BICEP 4, TRICEP 4 GRIP 3), LUE (DELTOID 4, GRIP 4). RLE (0 PROX, 1 DISTAL). LLE (2 PROX, 3 DISTAL).  SENSORY: normal and symmetric to light touch, temperature, vibration COORDINATION: LIMITED DUE TO PAIN AND DECR ROM REFLEXES: BUE 1, KNEES 1, ANKLES 0 GAIT/STATION: CANNOT STAND INDEPENDENTLY. ABLE TO TAKE FEW STEPS WITH WALKER. VERY  UNSTEADY.    DIAGNOSTIC DATA (LABS, IMAGING, TESTING) - I reviewed patient records, labs, notes, testing and imaging myself where available.  Lab Results  Component Value Date   WBC 2.4* 07/09/2013   HGB 11.1* 07/09/2013   HCT 33.7* 07/09/2013   MCV 69.5* 07/09/2013   PLT 283 07/09/2013      Component Value Date/Time   NA 138 07/09/2013 1357   K 3.1* 07/09/2013 1357   CL 100 07/09/2013 1357   CO2 20 07/09/2013 1357   GLUCOSE 82 07/09/2013 1357   BUN 9 07/09/2013 1357   CREATININE 0.52 07/09/2013 1357   CALCIUM 9.5 07/09/2013 1357   PROT 7.7 07/09/2013 1357   ALBUMIN 3.4* 07/09/2013 1357   AST 29 07/09/2013 1357   ALT 10 07/09/2013 1357   ALKPHOS 106 07/09/2013 1357   BILITOT 0.3 07/09/2013 1357   GFRNONAA >90 07/09/2013 1357   GFRAA >90 07/09/2013 1357   No results found for this basename: CHOL, HDL, LDLCALC, LDLDIRECT, TRIG, CHOLHDL  No results found for this basename: HGBA1C   No results found for this basename: VITAMINB12   No results found for this basename: TSH      ASSESSMENT AND PLAN  53 y.o. year old male here with progressive arthritis, joint pain, joint swelling, gait deterioration since 2005. Symptoms significantly worse over the past 2 years. Exam notable for significantly decreased range of motion right shoulder, decreased strength and right leg primarily but also more generally, endgaze nystagmus, poor memory.  Ddx: CNS autoimmune, inflamm, vascular, neuropathy, rheumatoid arthritis  PLAN: - consider rheumatology evaluation - consider home physical therapy - additional workup as below:  Orders Placed This Encounter  Procedures  . MR Brain W Wo Contrast  . MR Cervical Spine W Wo Contrast  . Vitamin B12  . CK  . Aldolase  . TSH  . ANA w/Reflex  . Rheumatoid Factor  . Cyclic citrul peptide antibody, IgG  . NCV with EMG(electromyography)   Return for emg.    Penni Bombard, MD 2/67/1245, 80:99 AM Certified in Neurology, Neurophysiology and  Neuroimaging  [facility] [address], [place], [place] [address] [phone]

## 2013-08-21 NOTE — Patient Instructions (Signed)
I will check additional testing. 

## 2013-08-21 NOTE — Addendum Note (Signed)
Addended byAndrey Spearman on: 08/21/2013 03:14 PM   Modules accepted: Orders

## 2013-08-22 LAB — ANA W/REFLEX: Anti Nuclear Antibody(ANA): NEGATIVE

## 2013-08-22 LAB — RHEUMATOID FACTOR: RHEUMATOID FACTOR: 116.4 [IU]/mL — AB (ref 0.0–13.9)

## 2013-08-22 LAB — TSH: TSH: 1.06 u[IU]/mL (ref 0.450–4.500)

## 2013-08-22 LAB — VITAMIN B12: VITAMIN B 12: 266 pg/mL (ref 211–946)

## 2013-08-22 LAB — CK: Total CK: 49 U/L (ref 24–204)

## 2013-08-22 LAB — ALDOLASE: ALDOLASE: 7.4 U/L (ref 3.3–10.3)

## 2013-08-23 LAB — CYCLIC CITRUL PEPTIDE ANTIBODY, IGG/IGA: Cyclic Citrullin Peptide Ab: >250 units — ABNORMAL HIGH (ref 0–19)

## 2013-09-03 ENCOUNTER — Ambulatory Visit (INDEPENDENT_AMBULATORY_CARE_PROVIDER_SITE_OTHER): Payer: Medicare Other | Admitting: Diagnostic Neuroimaging

## 2013-09-03 ENCOUNTER — Encounter (INDEPENDENT_AMBULATORY_CARE_PROVIDER_SITE_OTHER): Payer: Self-pay | Admitting: Radiology

## 2013-09-03 ENCOUNTER — Encounter (INDEPENDENT_AMBULATORY_CARE_PROVIDER_SITE_OTHER): Payer: Self-pay

## 2013-09-03 DIAGNOSIS — R269 Unspecified abnormalities of gait and mobility: Secondary | ICD-10-CM

## 2013-09-03 DIAGNOSIS — M25579 Pain in unspecified ankle and joints of unspecified foot: Secondary | ICD-10-CM

## 2013-09-03 DIAGNOSIS — M069 Rheumatoid arthritis, unspecified: Secondary | ICD-10-CM

## 2013-09-03 DIAGNOSIS — H5500 Unspecified nystagmus: Secondary | ICD-10-CM

## 2013-09-03 DIAGNOSIS — M6281 Muscle weakness (generalized): Secondary | ICD-10-CM

## 2013-09-03 DIAGNOSIS — M79609 Pain in unspecified limb: Secondary | ICD-10-CM

## 2013-09-03 DIAGNOSIS — Z0289 Encounter for other administrative examinations: Secondary | ICD-10-CM

## 2013-09-03 NOTE — Procedures (Signed)
   [facility]  NCS (NERVE CONDUCTION STUDY) WITH EMG (ELECTROMYOGRAPHY) REPORT   STUDY DATE: 09/03/13 PATIENT NAME: Jonathan [name] DOB: 06-15-1960 MRN: [id number]  ORDERING CLINICIAN: Andrey Spearman, MD   TECHNOLOGIST: Laretta Alstrom ELECTROMYOGRAPHER: Earlean Polka. Penumalli, MD  CLINICAL INFORMATION: 52 year old male with diffuse weakness.  FINDINGS: NERVE CONDUCTION STUDY: Bilateral median motor responses and F-wave latencies are normal. Bilateral ulnar motor responses have normal distal latencies, decreased amplitudes, normal conduction velocities and normal F-wave latencies except left ulnar conduction velocity was 2 relation above the elbow (42 m/s). Bilateral peroneal motor responses have normal distal latencies, decreased energy, normal conduction velocities and normal F-wave latencies. Bilateral tibial motor responses and F-wave latencies are normal. Bilateral median sensory responses are normal. Right ulnar and bilateral sural sensory responses have decreased amplitudes and normal conduction velocities. Left ulnar sensory response has decreased amplitude and slow conduction velocity (44 m/s).  NEEDLE ELECTROMYOGRAPHY: Needle examination of right upper and lower extremities demonstrates: Deltoid - no abnormal spontaneous activity; early recruitment of small polyphasic motor units Biceps - no abnormal spontaneous activity; decreased motor unit recruitment Triceps - +/- positive sharp waves; decreased motor unit recruitment Flexor carpi radialis - no abnormal spontaneous activity; normal motor unit recruitment First dorsal interosseous - no abnormal spontaneous activity; decreased motor unit recruitment  Vastus medialis - no abnormal spontaneous activity; normal motor unit recruitment Tibialis anterior  -  no abnormal spontaneous activity; decreased motor unit recruitment Gastrocnemius - 2+ positive sharp waves and fibrillation potentials; decreased motor  unit recruitment  IMPRESSION:  Abnormal study demonstrating: 1. Widespread axonal sensorimotor polyneuropathy. 2. Evidence of acute and chronic denervation changes affecting the right upper and right lower extremities. 3. 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   INTERPRETING PHYSICIAN:  Penni Bombard, MD Certified in Neurology, Neurophysiology and Neuroimaging  [facility] [address] [place], [place] [address] [phone]

## 2013-09-04 ENCOUNTER — Ambulatory Visit
Admission: RE | Admit: 2013-09-04 | Discharge: 2013-09-04 | Disposition: A | Discharge status: Home / Self Care | Payer: Medicare Other | Source: Ambulatory Visit | Attending: Diagnostic Neuroimaging | Admitting: Diagnostic Neuroimaging

## 2013-09-04 DIAGNOSIS — R269 Unspecified abnormalities of gait and mobility: Secondary | ICD-10-CM

## 2013-09-04 DIAGNOSIS — M79609 Pain in unspecified limb: Secondary | ICD-10-CM

## 2013-09-04 DIAGNOSIS — M25579 Pain in unspecified ankle and joints of unspecified foot: Secondary | ICD-10-CM

## 2013-09-04 DIAGNOSIS — M069 Rheumatoid arthritis, unspecified: Secondary | ICD-10-CM

## 2013-09-04 DIAGNOSIS — M6281 Muscle weakness (generalized): Secondary | ICD-10-CM

## 2013-09-04 DIAGNOSIS — H5500 Unspecified nystagmus: Secondary | ICD-10-CM

## 2013-09-04 MED ORDER — GADOBENATE DIMEGLUMINE 529 MG/ML IV SOLN
12.0000 mL | Freq: Once | INTRAVENOUS | Status: AC | PRN
  Administered 2013-09-04: 12 mL via INTRAVENOUS

## 2013-09-26 ENCOUNTER — Telehealth: Payer: Self-pay | Admitting: *Deleted

## 2013-09-26 NOTE — Telephone Encounter (Signed)
Need a note from [place] to [employer] transportation about his appointment.

## 2013-09-26 NOTE — Telephone Encounter (Signed)
Called pt to inform him that I called [facility] to inform them that pt was in our office on 08/21/13 and saw Dr. Leta Baptist. I advised them that if they have any other problems, questions or concerns to call the office. [facility] Transportation verbalized understanding.

## 2013-10-02 ENCOUNTER — Ambulatory Visit: Payer: Medicare Other | Attending: Internal Medicine

## 2013-10-02 DIAGNOSIS — IMO0001 Reserved for inherently not codable concepts without codable children: Secondary | ICD-10-CM | POA: Diagnosis not present

## 2013-10-02 DIAGNOSIS — R269 Unspecified abnormalities of gait and mobility: Secondary | ICD-10-CM | POA: Insufficient documentation

## 2013-10-16 ENCOUNTER — Ambulatory Visit: Payer: Medicare Other

## 2013-10-16 DIAGNOSIS — IMO0001 Reserved for inherently not codable concepts without codable children: Secondary | ICD-10-CM | POA: Diagnosis not present

## 2013-10-18 ENCOUNTER — Ambulatory Visit: Payer: Medicare Other

## 2013-10-18 ENCOUNTER — Telehealth: Payer: Self-pay | Admitting: Diagnostic Neuroimaging

## 2013-10-18 DIAGNOSIS — IMO0001 Reserved for inherently not codable concepts without codable children: Secondary | ICD-10-CM | POA: Diagnosis not present

## 2013-10-18 NOTE — Telephone Encounter (Signed)
Patient would like to know the results of his recent testing. Please call to advise.

## 2013-10-18 NOTE — Telephone Encounter (Signed)
Pt calling requesting the results of lab work, MRI and EMG. Please advise

## 2013-10-21 ENCOUNTER — Ambulatory Visit: Payer: Medicare Other

## 2013-10-21 DIAGNOSIS — IMO0001 Reserved for inherently not codable concepts without codable children: Secondary | ICD-10-CM | POA: Diagnosis not present

## 2013-10-22 NOTE — Telephone Encounter (Signed)
I called caregiver. Gave test results. Recommended eval by rheumatology and pain mgmt. They will contract PCP for referrals.   Penni Bombard, MD 09/21/509, 0:21 PM Certified in Neurology, Neurophysiology and Neuroimaging  [facility] [address], [place], [place] [address] [phone]

## 2013-10-23 ENCOUNTER — Ambulatory Visit: Payer: Medicare Other | Attending: Internal Medicine

## 2013-10-23 DIAGNOSIS — IMO0001 Reserved for inherently not codable concepts without codable children: Secondary | ICD-10-CM | POA: Insufficient documentation

## 2013-10-23 DIAGNOSIS — R269 Unspecified abnormalities of gait and mobility: Secondary | ICD-10-CM | POA: Insufficient documentation

## 2013-10-24 ENCOUNTER — Ambulatory Visit: Payer: Medicare Other

## 2013-10-24 DIAGNOSIS — IMO0001 Reserved for inherently not codable concepts without codable children: Secondary | ICD-10-CM | POA: Diagnosis not present

## 2013-10-28 ENCOUNTER — Ambulatory Visit: Payer: Medicare Other

## 2013-10-28 DIAGNOSIS — IMO0001 Reserved for inherently not codable concepts without codable children: Secondary | ICD-10-CM | POA: Diagnosis not present

## 2013-10-30 ENCOUNTER — Ambulatory Visit: Payer: Medicare Other

## 2013-10-31 ENCOUNTER — Ambulatory Visit: Payer: Medicare Other

## 2013-10-31 DIAGNOSIS — IMO0001 Reserved for inherently not codable concepts without codable children: Secondary | ICD-10-CM | POA: Diagnosis not present

## 2013-11-04 ENCOUNTER — Ambulatory Visit: Payer: Medicare Other | Admitting: Physical Therapy

## 2013-11-04 DIAGNOSIS — IMO0001 Reserved for inherently not codable concepts without codable children: Secondary | ICD-10-CM | POA: Diagnosis not present

## 2013-11-06 ENCOUNTER — Ambulatory Visit: Payer: Medicare Other

## 2013-11-06 DIAGNOSIS — IMO0001 Reserved for inherently not codable concepts without codable children: Secondary | ICD-10-CM | POA: Diagnosis not present

## 2013-11-08 ENCOUNTER — Ambulatory Visit: Payer: Medicare Other

## 2013-11-08 DIAGNOSIS — IMO0001 Reserved for inherently not codable concepts without codable children: Secondary | ICD-10-CM | POA: Diagnosis not present

## 2013-11-15 ENCOUNTER — Ambulatory Visit: Payer: Medicare Other | Admitting: Physical Therapy

## 2013-11-15 DIAGNOSIS — IMO0001 Reserved for inherently not codable concepts without codable children: Secondary | ICD-10-CM | POA: Diagnosis not present

## 2013-11-19 ENCOUNTER — Ambulatory Visit: Payer: Medicare Other | Admitting: Physical Therapy

## 2013-11-19 DIAGNOSIS — IMO0001 Reserved for inherently not codable concepts without codable children: Secondary | ICD-10-CM | POA: Diagnosis not present

## 2013-11-21 ENCOUNTER — Ambulatory Visit: Payer: Medicare Other | Admitting: Physical Therapy

## 2013-11-21 DIAGNOSIS — IMO0001 Reserved for inherently not codable concepts without codable children: Secondary | ICD-10-CM | POA: Diagnosis not present

## 2013-11-26 ENCOUNTER — Ambulatory Visit: Payer: Medicare Other | Attending: Internal Medicine

## 2013-11-26 DIAGNOSIS — IMO0001 Reserved for inherently not codable concepts without codable children: Secondary | ICD-10-CM | POA: Insufficient documentation

## 2013-11-26 DIAGNOSIS — R269 Unspecified abnormalities of gait and mobility: Secondary | ICD-10-CM | POA: Diagnosis not present

## 2013-11-28 ENCOUNTER — Ambulatory Visit: Payer: Medicare Other

## 2013-12-03 ENCOUNTER — Ambulatory Visit: Payer: Medicare Other | Admitting: Physical Therapy

## 2013-12-05 ENCOUNTER — Ambulatory Visit: Payer: Medicare Other | Admitting: Physical Therapy

## 2013-12-10 ENCOUNTER — Ambulatory Visit: Payer: Medicare Other | Admitting: Physical Therapy

## 2013-12-13 ENCOUNTER — Ambulatory Visit: Payer: Medicare Other | Admitting: Physical Therapy

## 2014-02-10 ENCOUNTER — Emergency Department (HOSPITAL_COMMUNITY)
Admission: EM | Admit: 2014-02-10 | Discharge: 2014-02-10 | Disposition: A | Discharge status: Home / Self Care | Payer: Medicare Other | Attending: Emergency Medicine | Admitting: Emergency Medicine

## 2014-02-10 ENCOUNTER — Encounter (HOSPITAL_COMMUNITY): Payer: Self-pay | Admitting: Emergency Medicine

## 2014-02-10 ENCOUNTER — Emergency Department (HOSPITAL_COMMUNITY): Payer: Medicare Other

## 2014-02-10 DIAGNOSIS — D649 Anemia, unspecified: Secondary | ICD-10-CM | POA: Diagnosis not present

## 2014-02-10 DIAGNOSIS — R0789 Other chest pain: Secondary | ICD-10-CM | POA: Diagnosis not present

## 2014-02-10 DIAGNOSIS — M1990 Unspecified osteoarthritis, unspecified site: Secondary | ICD-10-CM | POA: Insufficient documentation

## 2014-02-10 DIAGNOSIS — G8929 Other chronic pain: Secondary | ICD-10-CM | POA: Diagnosis not present

## 2014-02-10 DIAGNOSIS — Z79899 Other long term (current) drug therapy: Secondary | ICD-10-CM | POA: Insufficient documentation

## 2014-02-10 DIAGNOSIS — R079 Chest pain, unspecified: Secondary | ICD-10-CM | POA: Diagnosis present

## 2014-02-10 DIAGNOSIS — Z792 Long term (current) use of antibiotics: Secondary | ICD-10-CM | POA: Insufficient documentation

## 2014-02-10 DIAGNOSIS — R911 Solitary pulmonary nodule: Secondary | ICD-10-CM | POA: Insufficient documentation

## 2014-02-10 DIAGNOSIS — Z87891 Personal history of nicotine dependence: Secondary | ICD-10-CM | POA: Insufficient documentation

## 2014-02-10 DIAGNOSIS — Z872 Personal history of diseases of the skin and subcutaneous tissue: Secondary | ICD-10-CM | POA: Diagnosis not present

## 2014-02-10 DIAGNOSIS — Z8659 Personal history of other mental and behavioral disorders: Secondary | ICD-10-CM | POA: Diagnosis not present

## 2014-02-10 LAB — I-STAT TROPONIN, ED
TROPONIN I, POC: 0.02 ng/mL (ref 0.00–0.08)
Troponin i, poc: 0.01 ng/mL (ref 0.00–0.08)
Troponin i, poc: 0 ng/mL (ref 0.00–0.08)

## 2014-02-10 LAB — CBC
HEMATOCRIT: 33.5 % — AB (ref 39.0–52.0)
HEMOGLOBIN: 10.7 g/dL — AB (ref 13.0–17.0)
MCH: 22.5 pg — ABNORMAL LOW (ref 26.0–34.0)
MCHC: 31.9 g/dL (ref 30.0–36.0)
MCV: 70.5 fL — AB (ref 78.0–100.0)
Platelets: 302 10*3/uL (ref 150–400)
RBC: 4.75 MIL/uL (ref 4.22–5.81)
RDW: 16.5 % — ABNORMAL HIGH (ref 11.5–15.5)
WBC: 3.4 10*3/uL — AB (ref 4.0–10.5)

## 2014-02-10 LAB — BASIC METABOLIC PANEL
ANION GAP: 13 (ref 5–15)
BUN: 7 mg/dL (ref 6–23)
CALCIUM: 9.8 mg/dL (ref 8.4–10.5)
CHLORIDE: 102 meq/L (ref 96–112)
CO2: 24 meq/L (ref 19–32)
Creatinine, Ser: 0.54 mg/dL (ref 0.50–1.35)
GFR calc Af Amer: >90 mL/min (ref >90)
GFR calc non Af Amer: >90 mL/min (ref >90)
Glucose, Bld: 90 mg/dL (ref 70–99)
POTASSIUM: 3.9 meq/L (ref 3.7–5.3)
Sodium: 139 mEq/L (ref 137–147)

## 2014-02-10 LAB — D-DIMER, QUANTITATIVE: D-Dimer, Quant: 11 ug/mL-FEU — ABNORMAL HIGH (ref 0.00–0.48)

## 2014-02-10 MED ORDER — OXYCODONE-ACETAMINOPHEN 5-325 MG PO TABS
2.0000 | ORAL_TABLET | Freq: Once | ORAL | Status: AC
  Administered 2014-02-10: 2 via ORAL
  Filled 2014-02-10: qty 2

## 2014-02-10 MED ORDER — IOHEXOL 350 MG/ML SOLN
100.0000 mL | Freq: Once | INTRAVENOUS | Status: AC | PRN
  Administered 2014-02-10: 100 mL via INTRAVENOUS

## 2014-02-10 NOTE — ED Notes (Signed)
Patient states he started having chest pain about 15 minutes ago.   Patient states mid chest pain with no radiation.   Patient states also having bilateral foot pain.   Patient A & O and no other symptoms.

## 2014-02-10 NOTE — Discharge Instructions (Signed)

## 2014-02-10 NOTE — ED Notes (Signed)
Patient transported to X-ray 

## 2014-02-10 NOTE — ED Provider Notes (Signed)
CSN: [id number]     Arrival date & time 02/10/14  1341 History   First MD Initiated Contact with Patient 02/10/14 1604     Chief Complaint  Patient presents with  . Chest Pain  . Foot Pain     (Consider location/radiation/quality/duration/timing/severity/associated sxs/prior Treatment) HPI 53 year old male with history of rheumatoid arthritis, chronic generalized pain, chronic severe pain in both feet and both ankles 24 hours a day for years on chronic narcotics for pain management by his primary care Dr., he also has a chronic chest pain syndrome the last one to 3 days every week lasting for several hours to a few days at a time sharp stabbing localized without radiation or associated symptoms such as any fever cough shortness of breath nausea sweats or other concerns. He has chronic numbness with peripheral neuropathy to his feet which is unchanged. He has chronic difficulty with walking which is unchanged recently. He was here at the hospital for routine outpatient lab testing today when he mentioned he was having chest pain and was sent to the emergency department. Otherwise he was not going to come to the emergency department now he is here he wants to make sure he does not have a blood clot to his lungs. He has no exertional chest pain and has minimal if any pleuritic component. His pain is constant sharp stabbing worse with position changes and lasts typically for over 24 hours at a time. He has had the same type of mild to moderately severe chest pain every week for years. He has had the same severe constant pain in both feet and both ankles 24 hours a day for years.  Past Medical History  Diagnosis Date  . Arthritis     rheumatoid  . Hypercholesterolemia   . Anemia   . Depression   . Rheumatoid arthritis(714.0)   . Cellulitis    Past Surgical History  Procedure Laterality Date  . Knee arthroplasty  06/07/2011    Procedure: COMPUTER ASSISTED TOTAL KNEE ARTHROPLASTY;  Surgeon:  Mcarthur Rossetti, MD;  Location: [place];  Service: Orthopedics;  Laterality: Right;  Right total knee arthoplasty  . Knee closed reduction  07/19/2011    Procedure: CLOSED MANIPULATION KNEE;  Surgeon: Mcarthur Rossetti, MD;  Location: [place];  Service: Orthopedics;  Laterality: Right;  Manipulation under anesthesia right knee  . Joint replacement      L TKA  also right in 05/2011  . Esophagogastroduodenoscopy N/A 04/03/2013    Procedure: ESOPHAGOGASTRODUODENOSCOPY (EGD);  Surgeon: Irene Shipper, MD;  Location: [facility];  Service: Endoscopy;  Laterality: N/A;  changed to moderate dr. Henrene Pastor did not want to go to the o.r. for an endo colon-jmt  . Colonoscopy N/A 04/03/2013    Procedure: COLONOSCOPY;  Surgeon: Irene Shipper, MD;  Location: [facility];  Service: Endoscopy;  Laterality: N/A;   Family History  Problem Relation Age of Onset  . Anesthesia problems Neg Hx   . Cancer Mother     ?  Marland Kitchen Heart failure Father   . Hypertension Mother   . Cancer Brother    History  Substance Use Topics  . Smoking status: Former Smoker    Types: Cigarettes    Quit date: 04/25/2004  . Smokeless tobacco: Never Used  . Alcohol Use: No    Review of Systems  10 Systems reviewed and are negative for acute change except as noted in the HPI.  Allergies  Review of patient's allergies indicates no known allergies.  Home Medications   Prior to Admission medications   Medication Sig Start Date End Date Taking? Authorizing Provider  ferrous sulfate 325 (65 FE) MG tablet Take 325 mg by mouth 2 (two) times daily with a meal.   Yes Historical Provider, MD  oxyCODONE-acetaminophen (PERCOCET) 10-325 MG per tablet Take 1 tablet by mouth every 8 (eight) hours as needed for pain.     Historical Provider, MD  penicillin v potassium (VEETID) 500 MG tablet Take 1 tablet (500 mg total) by mouth 3 (three) times daily. 08/10/13   Carlisle Cater, PA-C  predniSONE (DELTASONE) 20 MG tablet 3 Tabs PO Days 1-3, then  2 tabs PO Days 4-6, then 1 tab PO Day 7-9, then Half Tab PO Day 10-12 08/10/13   Carlisle Cater, PA-C   BP 150/77 mmHg  Pulse 82  Temp(Src) 97.8 F (36.6 C) (Oral)  Resp 20  SpO2 99% Physical Exam  Nursing note and vitals reviewed. Constitutional:  Awake, alert, nontoxic appearance.  HENT:  Head: Atraumatic.  Eyes: Right eye exhibits no discharge. Left eye exhibits no discharge.  Neck: Neck supple.  Cardiovascular: Normal rate and regular rhythm.   No murmur heard. Pulmonary/Chest: Effort normal and breath sounds normal. No respiratory distress. He has no wheezes. He has no rales. He exhibits tenderness.  Anterior chest wall tenderness without instability or rash noted, pulse oximetry normal room air 100%  Abdominal: Soft. There is no tenderness. There is no rebound.  Musculoskeletal: He exhibits tenderness. He exhibits no edema.  Baseline ROM, no obvious new focal weakness. Dorsalis pedis pulses intact bilateral feet with capillary refill less than 2 seconds baseline light touch both feet baseline movement both feet baseline diffuse tenderness to both feet and ankles without edema without rash or ulcerations  Neurological: He is alert.  Mental status and motor strength appears baseline for patient and situation.  Skin: No rash noted.  Psychiatric: He has a normal mood and affect.    ED Course  Procedures (including critical care time) Low risk PE PTP Wells score 0. HEART score 2. Labs Review Labs Reviewed  CBC - Abnormal; Notable for the following:    WBC 3.4 (*)    Hemoglobin 10.7 (*)    HCT 33.5 (*)    MCV 70.5 (*)    MCH 22.5 (*)    RDW 16.5 (*)    All other components within normal limits  D-DIMER, QUANTITATIVE - Abnormal; Notable for the following:    D-Dimer, Quant 11.00 (*)    All other components within normal limits  BASIC METABOLIC PANEL  I-STAT TROPOININ, ED  I-STAT TROPOININ, ED  I-STAT TROPOININ, ED    Imaging Review No results found.   EKG  Interpretation   Date/Time:  Monday February 10 2014 13:55:04 EDT Ventricular Rate:  69 PR Interval:  140 QRS Duration: 86 QT Interval:  396 QTC Calculation: 424 R Axis:   67 Text Interpretation:  Normal sinus rhythm Minimal voltage criteria for  LVH, may be normal variant Borderline ECG Sinus rhythm Left ventricular  hypertrophy ST-t wave abnormality No significant change since last tracing  Abnormal ekg Confirmed by Carmin Muskrat  MD [phone]) on 02/10/2014  4:04:33 PM      MDM   Final diagnoses:  Other chest pain  Chronic pain  Solitary pulmonary nodule on lung CT    Patient informed of clinical course, understand medical decision-making process, and agree with plan. I doubt any other EMC precluding discharge at this time including, but not  necessarily limited to the following:PE.   Babette Relic, MD 02/23/14 2155

## 2014-05-08 ENCOUNTER — Encounter (HOSPITAL_COMMUNITY): Payer: Self-pay | Admitting: Internal Medicine

## 2014-07-14 ENCOUNTER — Emergency Department (HOSPITAL_COMMUNITY)
Admission: EM | Admit: 2014-07-14 | Discharge: 2014-07-14 | Disposition: A | Discharge status: Home / Self Care | Payer: Medicare Other | Attending: Emergency Medicine | Admitting: Emergency Medicine

## 2014-07-14 ENCOUNTER — Encounter (HOSPITAL_COMMUNITY): Payer: Self-pay | Admitting: Family Medicine

## 2014-07-14 DIAGNOSIS — Z87891 Personal history of nicotine dependence: Secondary | ICD-10-CM | POA: Insufficient documentation

## 2014-07-14 DIAGNOSIS — Z792 Long term (current) use of antibiotics: Secondary | ICD-10-CM | POA: Insufficient documentation

## 2014-07-14 DIAGNOSIS — M069 Rheumatoid arthritis, unspecified: Secondary | ICD-10-CM | POA: Insufficient documentation

## 2014-07-14 DIAGNOSIS — Z872 Personal history of diseases of the skin and subcutaneous tissue: Secondary | ICD-10-CM | POA: Diagnosis not present

## 2014-07-14 DIAGNOSIS — M79671 Pain in right foot: Secondary | ICD-10-CM | POA: Diagnosis present

## 2014-07-14 DIAGNOSIS — Z8659 Personal history of other mental and behavioral disorders: Secondary | ICD-10-CM | POA: Diagnosis not present

## 2014-07-14 DIAGNOSIS — D649 Anemia, unspecified: Secondary | ICD-10-CM | POA: Diagnosis not present

## 2014-07-14 DIAGNOSIS — Z8639 Personal history of other endocrine, nutritional and metabolic disease: Secondary | ICD-10-CM | POA: Diagnosis not present

## 2014-07-14 DIAGNOSIS — Z79899 Other long term (current) drug therapy: Secondary | ICD-10-CM | POA: Insufficient documentation

## 2014-07-14 LAB — BASIC METABOLIC PANEL
Anion gap: 8 (ref 5–15)
BUN: 7 mg/dL (ref 6–23)
CHLORIDE: 107 mmol/L (ref 96–112)
CO2: 26 mmol/L (ref 19–32)
CREATININE: 0.55 mg/dL (ref 0.50–1.35)
Calcium: 9.3 mg/dL (ref 8.4–10.5)
GFR calc Af Amer: >90 mL/min (ref >90)
GLUCOSE: 78 mg/dL (ref 70–99)
POTASSIUM: 3.8 mmol/L (ref 3.5–5.1)
Sodium: 141 mmol/L (ref 135–145)

## 2014-07-14 LAB — CBC WITH DIFFERENTIAL/PLATELET
Basophils Absolute: 0 10*3/uL (ref 0.0–0.1)
Basophils Relative: 1 % (ref 0–1)
Eosinophils Absolute: 0.2 10*3/uL (ref 0.0–0.7)
Eosinophils Relative: 6 % — ABNORMAL HIGH (ref 0–5)
HCT: 30.2 % — ABNORMAL LOW (ref 39.0–52.0)
Hemoglobin: 9.8 g/dL — ABNORMAL LOW (ref 13.0–17.0)
LYMPHS ABS: 0.8 10*3/uL (ref 0.7–4.0)
LYMPHS PCT: 24 % (ref 12–46)
MCH: 23.5 pg — AB (ref 26.0–34.0)
MCHC: 32.5 g/dL (ref 30.0–36.0)
MCV: 72.4 fL — ABNORMAL LOW (ref 78.0–100.0)
MONO ABS: 0.4 10*3/uL (ref 0.1–1.0)
MONOS PCT: 13 % — AB (ref 3–12)
Neutro Abs: 1.9 10*3/uL (ref 1.7–7.7)
Neutrophils Relative %: 56 % (ref 43–77)
Platelets: 300 10*3/uL (ref 150–400)
RBC: 4.17 MIL/uL — ABNORMAL LOW (ref 4.22–5.81)
RDW: 15.6 % — AB (ref 11.5–15.5)
WBC: 3.3 10*3/uL — AB (ref 4.0–10.5)

## 2014-07-14 MED ORDER — OXYCODONE-ACETAMINOPHEN 5-325 MG PO TABS: 1.0000 | ORAL_TABLET | ORAL | Status: DC | PRN

## 2014-07-14 MED ORDER — KETOROLAC TROMETHAMINE 60 MG/2ML IM SOLN
30.0000 mg | Freq: Once | INTRAMUSCULAR | Status: AC
  Administered 2014-07-14: 30 mg via INTRAMUSCULAR
  Filled 2014-07-14: qty 2

## 2014-07-14 MED ORDER — OXYCODONE-ACETAMINOPHEN 5-325 MG PO TABS
1.0000 | ORAL_TABLET | Freq: Once | ORAL | Status: AC
  Administered 2014-07-14: 1 via ORAL
  Filled 2014-07-14: qty 1

## 2014-07-14 NOTE — ED Notes (Signed)
Pt. Updated on wait time

## 2014-07-14 NOTE — ED Provider Notes (Signed)
CSN: [id number]     Arrival date & time 07/14/14  1129 History   First MD Initiated Contact with Patient 07/14/14 1503     Chief Complaint  Patient presents with  . Wound Infection  . Foot Pain   Jonathan [name] is a 54 y.o. male with a history of Rheumatoid arthritis, bilateral knee replacements and widespread polyneuropathy who presents to the ED complaining of bilateral foot pain worse for the past week. He reports he does have pain in his bilateral feet, but this has been worse in the past week. He complains or 10/10 foot pain and stiffness. He reports taking percocet yesterday with some relief. He reports a spot with drainage from his left foot several days ago, but none currently. He reports the swelling in his foot is worse than usual. He is not on any diuretics. He denies injury or trauma to his feet. He usually uses a wheelchair and/or a walker to get around with at home. He has a history of bilateral knee replacement in 2012 and 2013. Patient denies trauma to his feet, fevers, injury, or history of gout.   (Consider location/radiation/quality/duration/timing/severity/associated sxs/prior Treatment) HPI  Past Medical History  Diagnosis Date  . Arthritis     rheumatoid  . Hypercholesterolemia   . Anemia   . Depression   . Rheumatoid arthritis(714.0)   . Cellulitis    Past Surgical History  Procedure Laterality Date  . Knee arthroplasty  06/07/2011    Procedure: COMPUTER ASSISTED TOTAL KNEE ARTHROPLASTY;  Surgeon: Mcarthur Rossetti, MD;  Location: [place];  Service: Orthopedics;  Laterality: Right;  Right total knee arthoplasty  . Knee closed reduction  07/19/2011    Procedure: CLOSED MANIPULATION KNEE;  Surgeon: Mcarthur Rossetti, MD;  Location: [place];  Service: Orthopedics;  Laterality: Right;  Manipulation under anesthesia right knee  . Joint replacement      L TKA  also right in 05/2011  . Esophagogastroduodenoscopy N/A 04/03/2013    Procedure:  ESOPHAGOGASTRODUODENOSCOPY (EGD);  Surgeon: Irene Shipper, MD;  Location: [facility];  Service: Endoscopy;  Laterality: N/A;  changed to moderate dr. Henrene Pastor did not want to go to the o.r. for an endo colon-jmt  . Colonoscopy N/A 04/03/2013    Procedure: COLONOSCOPY;  Surgeon: Irene Shipper, MD;  Location: [facility];  Service: Endoscopy;  Laterality: N/A;   Family History  Problem Relation Age of Onset  . Anesthesia problems Neg Hx   . Cancer Mother     ?  Marland Kitchen Heart failure Father   . Hypertension Mother   . Cancer Brother    History  Substance Use Topics  . Smoking status: Former Smoker    Types: Cigarettes    Quit date: 04/25/2004  . Smokeless tobacco: Never Used  . Alcohol Use: No    Review of Systems  Constitutional: Negative for fever.  HENT: Negative for congestion, ear pain and sore throat.   Eyes: Negative for pain.  Respiratory: Negative for cough, shortness of breath and wheezing.   Cardiovascular: Negative for chest pain.  Gastrointestinal: Negative for nausea, vomiting, abdominal pain, diarrhea and blood in stool.  Genitourinary: Negative for dysuria and difficulty urinating.  Musculoskeletal: Positive for joint swelling and gait problem. Negative for back pain and neck pain.       Bilateral foot pain.   Skin: Negative for rash.  Neurological: Negative for light-headedness and headaches.      Allergies  Review of patient's allergies indicates no known allergies.  Home Medications   Prior to Admission medications   Medication Sig Start Date End Date Taking? Authorizing Provider  ferrous sulfate 325 (65 FE) MG tablet Take 325 mg by mouth 2 (two) times daily with a meal.   Yes Historical Provider, MD  oxyCODONE-acetaminophen (PERCOCET/ROXICET) 5-325 MG per tablet Take 1 tablet by mouth every 4 (four) hours as needed for severe pain. May take 2 tablets PO q 6 hours for severe pain - Do not take with Tylenol as this tablet already contains tylenol 07/14/14   Waynetta Pean, PA-C  penicillin v potassium (VEETID) 500 MG tablet Take 1 tablet (500 mg total) by mouth 3 (three) times daily. Patient not taking: Reported on 07/14/2014 08/10/13   Carlisle Cater, PA-C  predniSONE (DELTASONE) 20 MG tablet 3 Tabs PO Days 1-3, then 2 tabs PO Days 4-6, then 1 tab PO Day 7-9, then Half Tab PO Day 10-12 Patient not taking: Reported on 07/14/2014 08/10/13   Carlisle Cater, PA-C   BP 106/69 mmHg  Pulse 72  Temp(Src) 98.5 F (36.9 C) (Oral)  Resp 18  SpO2 100% Physical Exam  Constitutional: He appears well-developed and well-nourished. No distress.  HENT:  Head: Normocephalic and atraumatic.  Right Ear: External ear normal.  Left Ear: External ear normal.  Mouth/Throat: Oropharynx is clear and moist. No oropharyngeal exudate.  Eyes: Conjunctivae are normal. Pupils are equal, round, and reactive to light. Right eye exhibits no discharge. Left eye exhibits no discharge.  Cardiovascular: Normal rate, regular rhythm, normal heart sounds and intact distal pulses.  Exam reveals no gallop and no friction rub.   No murmur heard. Bilateral radial, posterior tibialis and dorsalis pedis pulses are intact.   Pulmonary/Chest: Effort normal and breath sounds normal. No respiratory distress. He has no wheezes. He has no rales.  Abdominal: Soft. There is no tenderness.  Musculoskeletal:  Bilateral feet have chronic deformities consistent with arthritis. No erythema or warmth appreciated. No site of drainage, infection or wound noted. There is a patch of dry skin to his left lateral foot. Bilateral DP and PT pulses are intact. He is able to move his lower extremities. Mild edema over the dorsal aspect of his bilateral feet. No calf or thigh edema.   Neurological: He is alert. Coordination normal.  Skin: Skin is warm and dry. No rash noted. He is not diaphoretic. No erythema. No pallor.  Psychiatric: He has a normal mood and affect. His behavior is normal.  Nursing note and vitals  reviewed.   ED Course  Procedures (including critical care time) Labs Review Labs Reviewed  CBC WITH DIFFERENTIAL/PLATELET - Abnormal; Notable for the following:    WBC 3.3 (*)    RBC 4.17 (*)    Hemoglobin 9.8 (*)    HCT 30.2 (*)    MCV 72.4 (*)    MCH 23.5 (*)    RDW 15.6 (*)    Monocytes Relative 13 (*)    Eosinophils Relative 6 (*)    All other components within normal limits  BASIC METABOLIC PANEL    Imaging Review No results found.   EKG Interpretation None      Filed Vitals:   07/14/14 1530 07/14/14 1715 07/14/14 1726 07/14/14 1852  BP: 108/70 109/76 109/76 106/69  Pulse: 78 72 76 72  Temp:      TempSrc:      Resp:   16 18  SpO2: 100% 100% 100% 100%     MDM   Meds given in ED:  Medications  ketorolac (TORADOL) injection 30 mg (30 mg Intramuscular Given 07/14/14 1734)  oxyCODONE-acetaminophen (PERCOCET/ROXICET) 5-325 MG per tablet 1 tablet (1 tablet Oral Given 07/14/14 1850)    Discharge Medication List as of 07/14/2014  6:47 PM    START taking these medications   Details  oxyCODONE-acetaminophen (PERCOCET/ROXICET) 5-325 MG per tablet Take 1 tablet by mouth every 4 (four) hours as needed for severe pain. May take 2 tablets PO q 6 hours for severe pain - Do not take with Tylenol as this tablet already contains tylenol, Starting 07/14/2014, Until Discontinued, Print        Final diagnoses:  Rheumatoid arthritis   This is a 54 y.o. male with a history of Rheumatoid arthritis, bilateral knee replacements and widespread polyneuropathy who presents to the ED complaining of bilateral foot pain worse for the past week. He reports he does have pain in his bilateral feet, but this has been worse in the past week. He complains or 10/10 foot pain and stiffness. He denies injury or trauma to his feet. He denies fevers or chills. Patient is afebrile and nontoxic-appearing. Patient has chronic deformities in his bilatearal feet consistent with arthritis. Patient has dry  skin to his left lateral foot. There is no drainage from his feet. There is no warmth or erythema to his feet. There is no evidence of cellulitis. Will check basic blood work and provide pain medicine. BMP is unremarkable. CBC is consistent with his previous studies. No significant change. No elevated white count.  Will discharge with pain medication and follow up with his PCP this week. His pain seems consistent with a flare of his arthritis. I advised the patient to follow-up with their primary care provider this week. I advised the patient to return to the emergency department with new or worsening symptoms or new concerns. The patient verbalized understanding and agreement with plan.   This patient was discussed with and evaluated by Dr. Tamera Punt who agrees with assessment and plan.    Waynetta Pean, PA-C 07/15/14 [place], MD 07/16/14 [phone]

## 2014-07-14 NOTE — Discharge Instructions (Signed)
Rheumatoid Arthritis °Rheumatoid arthritis is a long-term (chronic) inflammatory disease that causes pain, swelling, and stiffness of the joints. It can affect the entire body, including the eyes and lungs. The effects of rheumatoid arthritis vary widely among those with the condition. °CAUSES  °The cause of rheumatoid arthritis is not known. It tends to run in families and is more common in women. Certain cells of the body's natural defense system (immune system) do not work properly and begin to attack healthy joints. It primarily involves the connective tissue that lines the joints (synovial membrane). This can cause damage to the joint. °SYMPTOMS  °· Pain, stiffness, swelling, and decreased motion of many joints, especially in the hands and feet. °· Stiffness that is worse in the morning. It may last 1-2 hours or longer. °· Numbness and tingling in the hands. °· Fatigue. °· Loss of appetite. °· Weight loss. °· Low-grade fever. °· Dry eyes and mouth. °· Firm lumps (rheumatoid nodules) that grow beneath the skin in areas such as the elbows and hands. °DIAGNOSIS  °Diagnosis is based on the symptoms described, an exam, and blood tests. Sometimes, X-rays are helpful. °TREATMENT  °The goals of treatment are to relieve pain, reduce inflammation, and to slow down or stop joint damage and disability. Methods vary and may include: °· Maintaining a balance of rest, exercise, and proper nutrition. °· Medicines: °¨ Pain relievers (analgesics). °¨ Corticosteroids and nonsteroidal anti-inflammatory drugs (NSAIDs) to reduce inflammation. °¨ Disease-modifying antirheumatic drugs (DMARDs) to try to slow the course of the disease. °¨ Biologic response modifiers to reduce inflammation and damage. °· Physical therapy and occupational therapy. °· Surgery for patients with severe joint damage. Joint replacement or fusing of joints may be needed. °· Routine monitoring and ongoing care, such as office visits, blood and urine tests, and  X-rays. °HOME CARE INSTRUCTIONS  °· Remain physically active and reduce activity when the disease gets worse. °· Eat a well-balanced diet. °· Put heat on affected joints when you wake up and before activities. Keep the heat on the affected joint for as long as directed by your health care provider. °· Put ice on affected joints following activities or exercising. °¨ Put ice in a plastic bag. °¨ Place a towel between your skin and the bag. °¨ Leave the ice on for 15-20 minutes, 3-4 times per day, or as directed by your health care provider. °· Take medicines and supplements only as directed by your health care provider. °· Use splints as directed by your health care provider. Splints help maintain joint position and function. °· Do not sleep with pillows under your knees. This may lead to spasms. °· Participate in a self-management program to keep current with the latest treatment and coping skills. °SEEK IMMEDIATE MEDICAL CARE IF: °· You have fainting episodes. °· You have periods of extreme weakness. °· You rapidly develop a hot, painful joint that is more severe than usual joint aches. °· You have chills. °· You have a fever. °FOR MORE INFORMATION  °· [employer]: www.rheumatology.org °· [employer]: www.arthritis.org °Document Released: 04/08/2000 Document Revised: 08/26/2013 Document Reviewed: 05/18/2011 °[employer]® Patient Information ©2015 [employer]. This information is not intended to replace advice given to you by your health care provider. Make sure you discuss any questions you have with your health care provider. ° °

## 2014-07-14 NOTE — ED Notes (Signed)
Pt here for bilateral pain in feet. sts more on the left with drainage from foot. sts he cant walk on them

## 2014-07-18 ENCOUNTER — Emergency Department (HOSPITAL_COMMUNITY)
Admission: EM | Admit: 2014-07-18 | Discharge: 2014-07-18 | Disposition: A | Discharge status: Home / Self Care | Payer: Medicare Other | Attending: Emergency Medicine | Admitting: Emergency Medicine

## 2014-07-18 ENCOUNTER — Encounter (HOSPITAL_COMMUNITY): Payer: Self-pay | Admitting: Emergency Medicine

## 2014-07-18 DIAGNOSIS — Z8639 Personal history of other endocrine, nutritional and metabolic disease: Secondary | ICD-10-CM | POA: Diagnosis not present

## 2014-07-18 DIAGNOSIS — Z8659 Personal history of other mental and behavioral disorders: Secondary | ICD-10-CM | POA: Diagnosis not present

## 2014-07-18 DIAGNOSIS — M069 Rheumatoid arthritis, unspecified: Secondary | ICD-10-CM

## 2014-07-18 DIAGNOSIS — Z79899 Other long term (current) drug therapy: Secondary | ICD-10-CM | POA: Insufficient documentation

## 2014-07-18 DIAGNOSIS — Z792 Long term (current) use of antibiotics: Secondary | ICD-10-CM | POA: Diagnosis not present

## 2014-07-18 DIAGNOSIS — Z872 Personal history of diseases of the skin and subcutaneous tissue: Secondary | ICD-10-CM | POA: Diagnosis not present

## 2014-07-18 DIAGNOSIS — Z87891 Personal history of nicotine dependence: Secondary | ICD-10-CM | POA: Diagnosis not present

## 2014-07-18 DIAGNOSIS — M79671 Pain in right foot: Secondary | ICD-10-CM | POA: Diagnosis present

## 2014-07-18 DIAGNOSIS — D649 Anemia, unspecified: Secondary | ICD-10-CM | POA: Insufficient documentation

## 2014-07-18 DIAGNOSIS — M79672 Pain in left foot: Secondary | ICD-10-CM

## 2014-07-18 MED ORDER — KETOROLAC TROMETHAMINE 60 MG/2ML IM SOLN
60.0000 mg | Freq: Once | INTRAMUSCULAR | Status: AC
  Administered 2014-07-18: 60 mg via INTRAMUSCULAR
  Filled 2014-07-18: qty 2

## 2014-07-18 NOTE — ED Notes (Signed)
Per EMS-pt here with complaints of bilateral foot pain 10/10. Hx rheumatoid arthritis.

## 2014-07-18 NOTE — ED Provider Notes (Signed)
CSN: [id number]     Arrival date & time 07/18/14  1126 History   First MD Initiated Contact with Patient 07/18/14 1132     Chief Complaint  Patient presents with  . Foot Pain     (Consider location/radiation/quality/duration/timing/severity/associated sxs/prior Treatment) HPI Comments: 54 year old male past medical history of rheumatoid arthritis, anemia, hypercholesterolemia and depression presenting via EMS complaining of bilateral foot pain. States this feels like his typical arthritis flare. Was seen in the ED on 07/15/2014, given a prescription for 20 Percocet tablets which she has been taking with relief, however has run out. Once the pain medication was complete, his pain returned. Pain worse with walking, described as a burning sensation to the bottom of his feet radiating up to the top, 10/10. Denies redness, swelling or fever. No known injury or trauma. He has not discussed his worsening pain with his PCP.  Patient is a 54 y.o. male presenting with lower extremity pain. The history is provided by the patient and the EMS personnel.  Foot Pain    Past Medical History  Diagnosis Date  . Arthritis     rheumatoid  . Hypercholesterolemia   . Anemia   . Depression   . Rheumatoid arthritis(714.0)   . Cellulitis    Past Surgical History  Procedure Laterality Date  . Knee arthroplasty  06/07/2011    Procedure: COMPUTER ASSISTED TOTAL KNEE ARTHROPLASTY;  Surgeon: Mcarthur Rossetti, MD;  Location: [place];  Service: Orthopedics;  Laterality: Right;  Right total knee arthoplasty  . Knee closed reduction  07/19/2011    Procedure: CLOSED MANIPULATION KNEE;  Surgeon: Mcarthur Rossetti, MD;  Location: [place];  Service: Orthopedics;  Laterality: Right;  Manipulation under anesthesia right knee  . Joint replacement      L TKA  also right in 05/2011  . Esophagogastroduodenoscopy N/A 04/03/2013    Procedure: ESOPHAGOGASTRODUODENOSCOPY (EGD);  Surgeon: Irene Shipper, MD;  Location: [facility];  Service: Endoscopy;  Laterality: N/A;  changed to moderate dr. Henrene Pastor did not want to go to the o.r. for an endo colon-jmt  . Colonoscopy N/A 04/03/2013    Procedure: COLONOSCOPY;  Surgeon: Irene Shipper, MD;  Location: [facility];  Service: Endoscopy;  Laterality: N/A;   Family History  Problem Relation Age of Onset  . Anesthesia problems Neg Hx   . Cancer Mother     ?  Marland Kitchen Heart failure Father   . Hypertension Mother   . Cancer Brother    History  Substance Use Topics  . Smoking status: Former Smoker    Types: Cigarettes    Quit date: 04/25/2004  . Smokeless tobacco: Never Used  . Alcohol Use: No    Review of Systems  Musculoskeletal:       +BL foot pain.  All other systems reviewed and are negative.     Allergies  Review of patient's allergies indicates no known allergies.  Home Medications   Prior to Admission medications   Medication Sig Start Date End Date Taking? Authorizing Provider  ferrous sulfate 325 (65 FE) MG tablet Take 325 mg by mouth 2 (two) times daily with a meal.    Historical Provider, MD  oxyCODONE-acetaminophen (PERCOCET/ROXICET) 5-325 MG per tablet Take 1 tablet by mouth every 4 (four) hours as needed for severe pain. May take 2 tablets PO q 6 hours for severe pain - Do not take with Tylenol as this tablet already contains tylenol 07/14/14   Waynetta Pean, PA-C  penicillin v potassium (  VEETID) 500 MG tablet Take 1 tablet (500 mg total) by mouth 3 (three) times daily. Patient not taking: Reported on 07/14/2014 08/10/13   Carlisle Cater, PA-C  predniSONE (DELTASONE) 20 MG tablet 3 Tabs PO Days 1-3, then 2 tabs PO Days 4-6, then 1 tab PO Day 7-9, then Half Tab PO Day 10-12 Patient not taking: Reported on 07/14/2014 08/10/13   Carlisle Cater, PA-C   BP 108/70 mmHg  Pulse 80  Temp(Src) 98.5 F (36.9 C) (Oral)  Resp 17  SpO2 100% Physical Exam  Constitutional: He is oriented to person, place, and time. He appears well-developed and  well-nourished. No distress.  HENT:  Head: Normocephalic and atraumatic.  Eyes: Conjunctivae and EOM are normal.  Neck: Normal range of motion. Neck supple.  Cardiovascular: Normal rate, regular rhythm and normal heart sounds.   +2 PT/DP pulse BL.  Pulmonary/Chest: Effort normal and breath sounds normal.  Musculoskeletal:  Chronic arthritic deformity to BL feet most prominent at MTPs. No erythema, edema or warmth concerning for infection or gout. No wounds. Able to wiggle toes and ROM of bilateral ankles.  Neurological: He is alert and oriented to person, place, and time.  Skin: Skin is warm and dry.  Psychiatric: He has a normal mood and affect. His behavior is normal.  Nursing note and vitals reviewed.   ED Course  Procedures (including critical care time) Labs Review Labs Reviewed - No data to display  Imaging Review No results found.   EKG Interpretation None      MDM   Final diagnoses:  Bilateral foot pain  Rheumatoid arthritis flare   NAD. AF VSS. Neurovascularly intact distally. He received 20 tablets of Percocet 3 days ago. On review of controlled substance database, he receives a monthly prescription for Percocet from his PCP. I discussed with patient that he will need to follow-up with his PCP regarding better management of his chronic pain. I do not feel it is appropriate for a prescription for further pain medicine at this time. Toradol given in the emergency room. No signs of infection or gout. Stable for discharge. Return precautions given. Patient states understanding of treatment care plan and is agreeable.  Carman Ching, PA-C 07/18/14 [place], MD 08/01/14 [phone]

## 2014-07-18 NOTE — Discharge Instructions (Signed)
It is very important for you to follow-up with your primary care physician for better control of your chronic pain.  Rheumatoid Arthritis Rheumatoid arthritis is a long-term (chronic) inflammatory disease that causes pain, swelling, and stiffness of the joints. It can affect the entire body, including the eyes and lungs. The effects of rheumatoid arthritis vary widely among those with the condition. CAUSES  The cause of rheumatoid arthritis is not known. It tends to run in families and is more common in women. Certain cells of the body's natural defense system (immune system) do not work properly and begin to attack healthy joints. It primarily involves the connective tissue that lines the joints (synovial membrane). This can cause damage to the joint. SYMPTOMS   Pain, stiffness, swelling, and decreased motion of many joints, especially in the hands and feet.  Stiffness that is worse in the morning. It may last 1-2 hours or longer.  Numbness and tingling in the hands.  Fatigue.  Loss of appetite.  Weight loss.  Low-grade fever.  Dry eyes and mouth.  Firm lumps (rheumatoid nodules) that grow beneath the skin in areas such as the elbows and hands. DIAGNOSIS  Diagnosis is based on the symptoms described, an exam, and blood tests. Sometimes, X-rays are helpful. TREATMENT  The goals of treatment are to relieve pain, reduce inflammation, and to slow down or stop joint damage and disability. Methods vary and may include:  Maintaining a balance of rest, exercise, and proper nutrition.  Medicines:  Pain relievers (analgesics).  Corticosteroids and nonsteroidal anti-inflammatory drugs (NSAIDs) to reduce inflammation.  Disease-modifying antirheumatic drugs (DMARDs) to try to slow the course of the disease.  Biologic response modifiers to reduce inflammation and damage.  Physical therapy and occupational therapy.  Surgery for patients with severe joint damage. Joint replacement or  fusing of joints may be needed.  Routine monitoring and ongoing care, such as office visits, blood and urine tests, and X-rays. HOME CARE INSTRUCTIONS   Remain physically active and reduce activity when the disease gets worse.  Eat a well-balanced diet.  Put heat on affected joints when you wake up and before activities. Keep the heat on the affected joint for as long as directed by your health care provider.  Put ice on affected joints following activities or exercising.  Put ice in a plastic bag.  Place a towel between your skin and the bag.  Leave the ice on for 15-20 minutes, 3-4 times per day, or as directed by your health care provider.  Take medicines and supplements only as directed by your health care provider.  Use splints as directed by your health care provider. Splints help maintain joint position and function.  Do not sleep with pillows under your knees. This may lead to spasms.  Participate in a self-management program to keep current with the latest treatment and coping skills. SEEK IMMEDIATE MEDICAL CARE IF:  You have fainting episodes.  You have periods of extreme weakness.  You rapidly develop a hot, painful joint that is more severe than usual joint aches.  You have chills.  You have a fever. FOR MORE INFORMATION   [employer] Rheumat[employer]: www.rheumatology.[place]: www.arthritis.org Document Released: 04/08/2000 Document Revised: 08/26/2013 Document Reviewed: 05/18/2011 [facility] Patient Information 2015 [place], [place]. This information is not intended to replace advice given to you by your health care provider. Make sure you discuss any questions you have with your health care provider. Chronic Pain Chronic pain can be defined as pain  that is off and on and lasts for 3-6 months or longer. Many things cause chronic pain, which can make it difficult to make a diagnosis. There are many treatment options available for chronic  pain. However, finding a treatment that works well for you may require trying various approaches until the right one is found. Many people benefit from a combination of two or more types of treatment to control their pain. SYMPTOMS  Chronic pain can occur anywhere in the body and can range from mild to very severe. Some types of chronic pain include:  Headache.  Low back pain.  Cancer pain.  Arthritis pain.  Neurogenic pain. This is pain resulting from damage to nerves. People with chronic pain may also have other symptoms such as:  Depression.  Anger.  Insomnia.  Anxiety. DIAGNOSIS  Your health care provider will help diagnose your condition over time. In many cases, the initial focus will be on excluding possible conditions that could be causing the pain. Depending on your symptoms, your health care provider may order tests to diagnose your condition. Some of these tests may include:   Blood tests.   CT scan.   MRI.   X-rays.   Ultrasounds.   Nerve conduction studies.  You may need to see a specialist.  TREATMENT  Finding treatment that works well may take time. You may be referred to a pain specialist. He or she may prescribe medicine or therapies, such as:   Mindful meditation or yoga.  Shots (injections) of numbing or pain-relieving medicines into the spine or area of pain.  Local electrical stimulation.  Acupuncture.   Massage therapy.   Aroma, color, light, or sound therapy.   Biofeedback.   Working with a physical therapist to keep from getting stiff.   Regular, gentle exercise.   Cognitive or behavioral therapy.   Group support.  Sometimes, surgery may be recommended.  HOME CARE INSTRUCTIONS   Take all medicines as directed by your health care provider.   Lessen stress in your life by relaxing and doing things such as listening to calming music.   Exercise or be active as directed by your health care provider.   Eat a  healthy diet and include things such as vegetables, fruits, fish, and lean meats in your diet.   Keep all follow-up appointments with your health care provider.   Attend a support group with others suffering from chronic pain. SEEK MEDICAL CARE IF:   Your pain gets worse.   You develop a new pain that was not there before.   You cannot tolerate medicines given to you by your health care provider.   You have new symptoms since your last visit with your health care provider.  SEEK IMMEDIATE MEDICAL CARE IF:   You feel weak.   You have decreased sensation or numbness.   You lose control of bowel or bladder function.   Your pain suddenly gets much worse.   You develop shaking.  You develop chills.  You develop confusion.  You develop chest pain.  You develop shortness of breath.  MAKE SURE YOU:  Understand these instructions.  Will watch your condition.  Will get help right away if you are not doing well or get worse. Document Released: 01/01/2002 Document Revised: 12/12/2012 Document Reviewed: 10/05/2012 [facility] Patient Information 2015 [place], [place]. This information is not intended to replace advice given to you by your health care provider. Make sure you discuss any questions you have with your health care provider.

## 2014-07-18 NOTE — ED Notes (Signed)
Bed: WTR7 Expected date:  Expected time:  Means of arrival:  Comments: EMS-arthritis pain

## 2014-09-03 ENCOUNTER — Emergency Department (HOSPITAL_COMMUNITY)
Admission: EM | Admit: 2014-09-03 | Discharge: 2014-09-03 | Disposition: A | Discharge status: Home / Self Care | Payer: Medicare Other | Attending: Emergency Medicine | Admitting: Emergency Medicine

## 2014-09-03 ENCOUNTER — Encounter (HOSPITAL_COMMUNITY): Payer: Self-pay | Admitting: Emergency Medicine

## 2014-09-03 ENCOUNTER — Emergency Department (HOSPITAL_COMMUNITY): Payer: Medicare Other

## 2014-09-03 DIAGNOSIS — S4992XA Unspecified injury of left shoulder and upper arm, initial encounter: Secondary | ICD-10-CM | POA: Diagnosis not present

## 2014-09-03 DIAGNOSIS — M069 Rheumatoid arthritis, unspecified: Secondary | ICD-10-CM | POA: Insufficient documentation

## 2014-09-03 DIAGNOSIS — L97321 Non-pressure chronic ulcer of left ankle limited to breakdown of skin: Secondary | ICD-10-CM | POA: Diagnosis not present

## 2014-09-03 DIAGNOSIS — Z8659 Personal history of other mental and behavioral disorders: Secondary | ICD-10-CM | POA: Diagnosis not present

## 2014-09-03 DIAGNOSIS — S42402A Unspecified fracture of lower end of left humerus, initial encounter for closed fracture: Secondary | ICD-10-CM | POA: Insufficient documentation

## 2014-09-03 DIAGNOSIS — Z8639 Personal history of other endocrine, nutritional and metabolic disease: Secondary | ICD-10-CM | POA: Diagnosis not present

## 2014-09-03 DIAGNOSIS — W19XXXA Unspecified fall, initial encounter: Secondary | ICD-10-CM

## 2014-09-03 DIAGNOSIS — W010XXA Fall on same level from slipping, tripping and stumbling without subsequent striking against object, initial encounter: Secondary | ICD-10-CM | POA: Insufficient documentation

## 2014-09-03 DIAGNOSIS — Y92009 Unspecified place in unspecified non-institutional (private) residence as the place of occurrence of the external cause: Secondary | ICD-10-CM | POA: Insufficient documentation

## 2014-09-03 DIAGNOSIS — Z87891 Personal history of nicotine dependence: Secondary | ICD-10-CM | POA: Diagnosis not present

## 2014-09-03 DIAGNOSIS — D649 Anemia, unspecified: Secondary | ICD-10-CM | POA: Diagnosis not present

## 2014-09-03 DIAGNOSIS — Z79899 Other long term (current) drug therapy: Secondary | ICD-10-CM | POA: Diagnosis not present

## 2014-09-03 DIAGNOSIS — Y9389 Activity, other specified: Secondary | ICD-10-CM | POA: Diagnosis not present

## 2014-09-03 DIAGNOSIS — Y998 Other external cause status: Secondary | ICD-10-CM | POA: Insufficient documentation

## 2014-09-03 DIAGNOSIS — S59902A Unspecified injury of left elbow, initial encounter: Secondary | ICD-10-CM | POA: Diagnosis present

## 2014-09-03 LAB — CBC WITH DIFFERENTIAL/PLATELET
BASOS PCT: 1 % (ref 0–1)
Basophils Absolute: 0 10*3/uL (ref 0.0–0.1)
Eosinophils Absolute: 0.2 10*3/uL (ref 0.0–0.7)
Eosinophils Relative: 5 % (ref 0–5)
HCT: 31.7 % — ABNORMAL LOW (ref 39.0–52.0)
Hemoglobin: 9.9 g/dL — ABNORMAL LOW (ref 13.0–17.0)
LYMPHS ABS: 0.8 10*3/uL (ref 0.7–4.0)
LYMPHS PCT: 20 % (ref 12–46)
MCH: 22 pg — ABNORMAL LOW (ref 26.0–34.0)
MCHC: 31.2 g/dL (ref 30.0–36.0)
MCV: 70.6 fL — ABNORMAL LOW (ref 78.0–100.0)
MONO ABS: 0.4 10*3/uL (ref 0.1–1.0)
Monocytes Relative: 10 % (ref 3–12)
NEUTROS PCT: 64 % (ref 43–77)
Neutro Abs: 2.4 10*3/uL (ref 1.7–7.7)
Platelets: 294 10*3/uL (ref 150–400)
RBC: 4.49 MIL/uL (ref 4.22–5.81)
RDW: 16.4 % — AB (ref 11.5–15.5)
WBC: 3.8 10*3/uL — ABNORMAL LOW (ref 4.0–10.5)

## 2014-09-03 LAB — COMPREHENSIVE METABOLIC PANEL
ALBUMIN: 3.2 g/dL — AB (ref 3.5–5.0)
ALT: 15 U/L — AB (ref 17–63)
AST: 36 U/L (ref 15–41)
Alkaline Phosphatase: 108 U/L (ref 38–126)
Anion gap: 12 (ref 5–15)
BUN: 5 mg/dL — ABNORMAL LOW (ref 6–20)
CHLORIDE: 105 mmol/L (ref 101–111)
CO2: 21 mmol/L — ABNORMAL LOW (ref 22–32)
Calcium: 9.4 mg/dL (ref 8.9–10.3)
Creatinine, Ser: 0.65 mg/dL (ref 0.61–1.24)
GFR calc Af Amer: >60 mL/min (ref >60)
GFR calc non Af Amer: >60 mL/min (ref >60)
GLUCOSE: 148 mg/dL — AB (ref 70–99)
POTASSIUM: 3.2 mmol/L — AB (ref 3.5–5.1)
SODIUM: 138 mmol/L (ref 135–145)
Total Bilirubin: 0.5 mg/dL (ref 0.3–1.2)
Total Protein: 7.9 g/dL (ref 6.5–8.1)

## 2014-09-03 MED ORDER — SULFAMETHOXAZOLE-TRIMETHOPRIM 800-160 MG PO TABS: 1.0000 | ORAL_TABLET | Freq: Two times a day (BID) | ORAL | Status: AC

## 2014-09-03 MED ORDER — HYDROCODONE-ACETAMINOPHEN 5-325 MG PO TABS
1.0000 | ORAL_TABLET | Freq: Once | ORAL | Status: AC
  Administered 2014-09-03: 1 via ORAL
  Filled 2014-09-03: qty 1

## 2014-09-03 MED ORDER — HYDROCODONE-ACETAMINOPHEN 5-325 MG PO TABS: 1.0000 | ORAL_TABLET | Freq: Four times a day (QID) | ORAL | Status: DC | PRN

## 2014-09-03 NOTE — ED Provider Notes (Signed)
CSN: [id number]     Arrival date & time 09/03/14  1940 History   First MD Initiated Contact with Patient 09/03/14 2059     Chief Complaint  Patient presents with  . Leg Swelling    The patient said he started having leg and foot swelling.  He also said he started developing a foot ulcer on his left foot and it is draining pus.  he also said he fell on his left arm and he thinks her broke it.  . Foot Ulcer  . Fall     (Consider location/radiation/quality/duration/timing/severity/associated sxs/prior Treatment) HPI Comments: Patient is a 54 year old male past medical history significant for RA, chronic stasis ulcers, depression, anemia, hypercholesterolemia, arthritis presenting to the emergency department for evaluation of 2 complaints. Patient states around 1 PM this afternoon he slipped, lost his footing and fell and landed on his left elbow and shoulder. Denies hitting his head or loss of consciousness. States he was able to get himself up on his own. He is complaining of left shoulder pain and elbow pain. Denies any precipitating chest pain, headache, shortness of breath. Second complaint is increased drainage from left ankle ulcer. Patient states his cousin noticed increased drainage from his left ankle ulcer over the last day or so. Patient denies any injury. Denies any fevers, chills, nausea, vomiting. Patient lives at home with his cousin, has a home health nurse that comes in.  Patient is a 53 y.o. male presenting with fall.  Fall Associated symptoms include arthralgias and myalgias. Pertinent negatives include no nausea or vomiting.    Past Medical History  Diagnosis Date  . Arthritis     rheumatoid  . Hypercholesterolemia   . Anemia   . Depression   . Rheumatoid arthritis(714.0)   . Cellulitis    Past Surgical History  Procedure Laterality Date  . Knee arthroplasty  06/07/2011    Procedure: COMPUTER ASSISTED TOTAL KNEE ARTHROPLASTY;  Surgeon: Mcarthur Rossetti, MD;   Location: [place];  Service: Orthopedics;  Laterality: Right;  Right total knee arthoplasty  . Knee closed reduction  07/19/2011    Procedure: CLOSED MANIPULATION KNEE;  Surgeon: Mcarthur Rossetti, MD;  Location: [place];  Service: Orthopedics;  Laterality: Right;  Manipulation under anesthesia right knee  . Joint replacement      L TKA  also right in 05/2011  . Esophagogastroduodenoscopy N/A 04/03/2013    Procedure: ESOPHAGOGASTRODUODENOSCOPY (EGD);  Surgeon: Irene Shipper, MD;  Location: [facility];  Service: Endoscopy;  Laterality: N/A;  changed to moderate dr. Henrene Pastor did not want to go to the o.r. for an endo colon-jmt  . Colonoscopy N/A 04/03/2013    Procedure: COLONOSCOPY;  Surgeon: Irene Shipper, MD;  Location: [facility];  Service: Endoscopy;  Laterality: N/A;   Family History  Problem Relation Age of Onset  . Anesthesia problems Neg Hx   . Cancer Mother     ?  Marland Kitchen Heart failure Father   . Hypertension Mother   . Cancer Brother    History  Substance Use Topics  . Smoking status: Former Smoker    Types: Cigarettes    Quit date: 04/25/2004  . Smokeless tobacco: Never Used  . Alcohol Use: No    Review of Systems  Gastrointestinal: Negative for nausea and vomiting.  Musculoskeletal: Positive for myalgias and arthralgias.  Skin: Positive for wound.  All other systems reviewed and are negative.     Allergies  Review of patient's allergies indicates no known allergies.  Home Medications   Prior to Admission medications   Medication Sig Start Date End Date Taking? Authorizing Provider  ferrous sulfate 325 (65 FE) MG tablet Take 325 mg by mouth 2 (two) times daily with a meal.   Yes Historical Provider, MD  HYDROcodone-acetaminophen (NORCO/VICODIN) 5-325 MG per tablet Take 1-2 tablets by mouth every 6 (six) hours as needed for severe pain. 09/03/14   Semaya Vida, PA-C  oxyCODONE-acetaminophen (PERCOCET/ROXICET) 5-325 MG per tablet Take 1 tablet by mouth every 4  (four) hours as needed for severe pain. May take 2 tablets PO q 6 hours for severe pain - Do not take with Tylenol as this tablet already contains tylenol 07/14/14   Waynetta Pean, PA-C  penicillin v potassium (VEETID) 500 MG tablet Take 1 tablet (500 mg total) by mouth 3 (three) times daily. Patient not taking: Reported on 07/14/2014 08/10/13   Carlisle Cater, PA-C  predniSONE (DELTASONE) 20 MG tablet 3 Tabs PO Days 1-3, then 2 tabs PO Days 4-6, then 1 tab PO Day 7-9, then Half Tab PO Day 10-12 Patient not taking: Reported on 07/14/2014 08/10/13   Carlisle Cater, PA-C  sulfamethoxazole-trimethoprim (BACTRIM DS,SEPTRA DS) 800-160 MG per tablet Take 1 tablet by mouth 2 (two) times daily. 09/03/14 09/10/14  Coleton Woon, PA-C   BP 129/72 mmHg  Pulse 91  Temp(Src) 99.3 F (37.4 C) (Oral)  Resp 20  Ht 5\' 6"  (1.676 m)  Wt 127 lb (57.607 kg)  BMI 20.51 kg/m2  SpO2 99% Physical Exam  Constitutional: He is oriented to person, place, and time. He appears well-developed and well-nourished. No distress.  HENT:  Head: Normocephalic and atraumatic.  Right Ear: External ear normal.  Left Ear: External ear normal.  Nose: Nose normal.  Mouth/Throat: Oropharynx is clear and moist. No oropharyngeal exudate.  Eyes: Conjunctivae and EOM are normal. Pupils are equal, round, and reactive to light.  Neck: Normal range of motion. Neck supple.  Cardiovascular: Normal rate, regular rhythm, normal heart sounds and intact distal pulses.   Pulmonary/Chest: Effort normal and breath sounds normal. No respiratory distress.  Abdominal: Soft. There is no tenderness.  Musculoskeletal: He exhibits tenderness.       Right shoulder: Normal.       Left shoulder: He exhibits decreased range of motion and tenderness. He exhibits no swelling, no effusion, no crepitus, no deformity and no laceration.       Left elbow: He exhibits decreased range of motion, swelling and deformity. He exhibits no effusion and no laceration.  Tenderness found.       Cervical back: Normal.       Feet:  Bony changes consistent with rheumatoid arthritis of bilateral hands. Mild swelling tenderness of bilateral feet.  No calf tenderness or swelling.   Neurological: He is alert and oriented to person, place, and time. He has normal strength. No cranial nerve deficit. Gait normal. GCS eye subscore is 4. GCS verbal subscore is 5. GCS motor subscore is 6.  Sensation grossly intact.  No pronator drift.  Bilateral heel-knee-shin intact.  Skin: Skin is warm and dry. He is not diaphoretic.  Nursing note and vitals reviewed.   ED Course  Procedures (including critical care time) Medications  HYDROcodone-acetaminophen (NORCO/VICODIN) 5-325 MG per tablet 1 tablet (1 tablet Oral Given 09/03/14 2212)    Labs Review Labs Reviewed  CBC WITH DIFFERENTIAL/PLATELET - Abnormal; Notable for the following:    WBC 3.8 (*)    Hemoglobin 9.9 (*)    HCT 31.7 (*)  MCV 70.6 (*)    MCH 22.0 (*)    RDW 16.4 (*)    All other components within normal limits  COMPREHENSIVE METABOLIC PANEL - Abnormal; Notable for the following:    Potassium 3.2 (*)    CO2 21 (*)    Glucose, Bld 148 (*)    BUN 5 (*)    Albumin 3.2 (*)    ALT 15 (*)    All other components within normal limits    Imaging Review Dg Elbow Complete Left  09/03/2014   CLINICAL DATA:  [place] at home 5 days ago.  EXAM: LEFT ELBOW - COMPLETE 3+ VIEW  COMPARISON:  None.  FINDINGS: There is severe chronic deformity of the left elbow. There is a dislocation, but this may also be chronic. The shape of the olecranon indicates significant chronicity of the left elbow arthropathy. There is irregularity which may represent a superimposed acute fracture, particularly at the medial aspect of the joint. There is no radiopaque foreign body.  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 There may be a superimposed acute fracture at the medial aspect of the  joint. There is no radiopaque foreign body.   Electronically Signed   By: Andreas Newport M.D.   On: 09/03/2014 21:05   Dg Shoulder Left  09/03/2014   CLINICAL DATA:  Acute left shoulder pain after fall at home 5 days ago. Initial encounter.  EXAM: LEFT SHOULDER - 2+ VIEW  COMPARISON:  None.  FINDINGS: No definite fracture or dislocation is noted. Severe degenerative change of the left glenohumeral joint is noted as well as narrowing of the subacromial space suggesting rotator cuff injury. Deformity of left humeral head is noted most likely due to degenerative change.  IMPRESSION: Severe degenerative changes are seen involving the left shoulder. No fracture or dislocation is noted.   Electronically Signed   By: Marijo Conception, M.D.   On: 09/03/2014 21:06     EKG Interpretation None      SPLINT APPLICATION Date/Time: 7:25 AM Authorized by: Baron Sane L Consent: Verbal consent obtained. Risks and benefits: risks, benefits and alternatives were discussed Consent given by: patient Splint applied by: orthopedic technician Location details: left elbow Splint type: posterior long arm Supplies used: ortho glass Post-procedure: The splinted body part was neurovascularly unchanged following the procedure. Patient tolerance: Patient tolerated the procedure well with no immediate complications.    MDM   Final diagnoses:  Left elbow fracture, closed, initial encounter  Fall, initial encounter  Chronic ulcer of left ankle, limited to breakdown of skin    Filed Vitals:   09/03/14 2239  BP:   Pulse:   Temp: 99.3 F (37.4 C)  Resp:    Afebrile, NAD, non-toxic appearing, AAOx4 appropriate for age.   I have reviewed nursing notes, vital signs, and all appropriate lab and imaging results if ordered as above.   1) Left elbow fracture: Neurovascularly intact. Normal sensation. No evidence of compartment syndrome. Patient with swelling of left elbow. Deformity. No skin tenting.  X-ray reviewed with chronic dislocation and articular joint changes with questionable acute fracture. Posterior long arm splint placed, sling given. Will have patient follow-up with orthopedics for reevaluation.  2) Chronic ulcer: Left ulcer to lateral malleolus limited to breakdown skin noted. No erythema or warmth. No purulent drainage. Clear drainage appreciated. Mild bilateral ankle swelling appreciated. Will place on Bactrim. Wound cleansed. Advised recheck by PCP in 1-2 days.  Return precautions discussed.  Patient is agreeable to plan. Patient stable at time of discharge.  Patient d/w with Dr. Vanita Panda, agrees with plan.    Baron Sane, PA-C 09/04/14 0449  Carmin Muskrat, MD 09/04/14 2131

## 2014-09-03 NOTE — Progress Notes (Signed)
Orthopedic Tech Progress Note Patient Details:  Jonathan [name] 1960/11/24 [id number]  Ortho Devices Type of Ortho Device: Ace wrap, Arm sling, Post (long arm) splint Ortho Device/Splint Location: LUE Ortho Device/Splint Interventions: Ordered, Application   Braulio Bosch 09/03/2014, 9:52 PM

## 2014-09-03 NOTE — ED Notes (Signed)
The patient said he started having leg and foot swelling.  He also said he started developing a foot ulcer on his left foot and it is draining pus.  he also said he fell on his left arm and he thinks her broke it.  He denies fever or any other symptoms.

## 2014-09-03 NOTE — Discharge Instructions (Signed)
Please follow up with your primary care physician in 1-2 days. If you do not have one please call the [place] number listed above. Please follow up with Dr. Erlinda Hong to schedule a follow up appointment.  Please take pain medication and/or muscle relaxants as prescribed and as needed for pain. Please take your antibiotic until completion. Please do not drive on narcotic pain medication or on muscle relaxants. Please read all discharge instructions and return precautions.   Skin Ulcer A skin ulcer is an open sore that can be shallow or deep. Skin ulcers sometimes become infected and are difficult to treat. It may be 1 month or longer before real healing progress is made. CAUSES   Injury.  Problems with the veins or arteries.  Diabetes.  Insect bites.  Bedsores.  Inflammatory conditions. SYMPTOMS   Pain, redness, swelling, and tenderness around the ulcer.  Fever.  Bleeding from the ulcer.  Yellow or clear fluid coming from the ulcer. DIAGNOSIS  There are many types of skin ulcers. Any open sores will be examined. Certain tests will be done to determine the kind of ulcer you have. The right treatment depends on the type of ulcer you have. TREATMENT  Treatment is a long-term challenge. It may include:  Wearing an elastic wrap, compression stockings, or gel cast over the ulcer area.  Taking antibiotic medicines or putting antibiotic creams on the affected area if there is an infection. HOME CARE INSTRUCTIONS  Put on your bandages (dressings), wraps, or casts over the ulcer as directed by your caregiver.  Change all dressings as directed by your caregiver.  Take all medicines as directed by your caregiver.  Keep the affected area clean and dry.  Avoid injuries to the affected area.  Eat a well-balanced, healthy diet that includes plenty of fruit and vegetables.  If you smoke, consider quitting or decreasing the amount of cigarettes you smoke.  Once the  ulcer heals, get regular exercise as directed by your caregiver.  Work with your caregiver to make sure your blood pressure, cholesterol, and diabetes are well-controlled.  Keep your skin moisturized. Dry skin can crack and lead to skin ulcers. SEEK IMMEDIATE MEDICAL CARE IF:   Your pain gets worse.  You have swelling, redness, or fluids around the ulcer.  You have chills.  You have a fever. MAKE SURE YOU:   Understand these instructions.  Will watch your condition.  Will get help right away if you are not doing well or get worse. Document Released: 05/19/2004 Document Revised: 07/04/2011 Document Reviewed: 11/26/2010 [facility] Patient Information 2015 [place], [place]. This information is not intended to replace advice given to you by your health care provider. Make sure you discuss any questions you have with your health care provider.  Cast or Splint Care Casts and splints support injured limbs and keep bones from moving while they heal. It is important to care for your cast or splint at home.  HOME CARE INSTRUCTIONS  Keep the cast or splint uncovered during the drying period. It can take 24 to 48 hours to dry if it is made of plaster. A fiberglass cast will dry in less than 1 hour.  Do not rest the cast on anything harder than a pillow for the first 24 hours.  Do not put weight on your injured limb or apply pressure to the cast until your health care provider gives you permission.  Keep the cast or splint dry. Wet casts or splints can lose their  shape and may not support the limb as well. A wet cast that has lost its shape can also create harmful pressure on your skin when it dries. Also, wet skin can become infected.  Cover the cast or splint with a plastic bag when bathing or when out in the rain or snow. If the cast is on the trunk of the body, take sponge baths until the cast is removed.  If your cast does become wet, dry it with a towel or a blow dryer on the cool  setting only.  Keep your cast or splint clean. Soiled casts may be wiped with a moistened cloth.  Do not place any hard or soft foreign objects under your cast or splint, such as cotton, toilet paper, lotion, or powder.  Do not try to scratch the skin under the cast with any object. The object could get stuck inside the cast. Also, scratching could lead to an infection. If itching is a problem, use a blow dryer on a cool setting to relieve discomfort.  Do not trim or cut your cast or remove padding from inside of it.  Exercise all joints next to the injury that are not immobilized by the cast or splint. For example, if you have a long leg cast, exercise the hip joint and toes. If you have an arm cast or splint, exercise the shoulder, elbow, thumb, and fingers.  Elevate your injured arm or leg on 1 or 2 pillows for the first 1 to 3 days to decrease swelling and pain.It is best if you can comfortably elevate your cast so it is higher than your heart. SEEK MEDICAL CARE IF:   Your cast or splint cracks.  Your cast or splint is too tight or too loose.  You have unbearable itching inside the cast.  Your cast becomes wet or develops a soft spot or area.  You have a bad smell coming from inside your cast.  You get an object stuck under your cast.  Your skin around the cast becomes red or raw.  You have new pain or worsening pain after the cast has been applied. SEEK IMMEDIATE MEDICAL CARE IF:   You have fluid leaking through the cast.  You are unable to move your fingers or toes.  You have discolored (blue or white), cool, painful, or very swollen fingers or toes beyond the cast.  You have tingling or numbness around the injured area.  You have severe pain or pressure under the cast.  You have any difficulty with your breathing or have shortness of breath.  You have chest pain. Document Released: 04/08/2000 Document Revised: 01/30/2013 Document Reviewed: 10/18/2012 [facility]  Patient Information 2015 [place], [place]. This information is not intended to replace advice given to you by your health care provider. Make sure you discuss any questions you have with your health care provider.

## 2014-09-03 NOTE — ED Notes (Signed)
Ortho notified

## 2014-09-03 NOTE — ED Notes (Signed)
Ice is on pts arm

## 2014-09-03 NOTE — ED Notes (Signed)
Ortho tech at bedside 

## 2014-09-15 ENCOUNTER — Other Ambulatory Visit: Payer: Self-pay | Admitting: Orthopaedic Surgery

## 2014-09-15 DIAGNOSIS — M25522 Pain in left elbow: Secondary | ICD-10-CM

## 2014-09-17 ENCOUNTER — Ambulatory Visit
Admission: RE | Admit: 2014-09-17 | Discharge: 2014-09-17 | Disposition: A | Discharge status: Home / Self Care | Payer: Medicare Other | Source: Ambulatory Visit | Attending: Orthopaedic Surgery | Admitting: Orthopaedic Surgery

## 2014-09-17 DIAGNOSIS — M25522 Pain in left elbow: Secondary | ICD-10-CM

## 2014-10-28 ENCOUNTER — Other Ambulatory Visit (HOSPITAL_COMMUNITY): Payer: Self-pay | Admitting: Orthopaedic Surgery

## 2014-10-29 ENCOUNTER — Other Ambulatory Visit (HOSPITAL_COMMUNITY): Payer: Self-pay | Admitting: Orthopaedic Surgery

## 2014-10-30 ENCOUNTER — Encounter (HOSPITAL_COMMUNITY)
Admission: RE | Admit: 2014-10-30 | Discharge: 2014-10-30 | Disposition: A | Discharge status: Home / Self Care | Payer: Medicare Other | Source: Ambulatory Visit | Attending: Orthopaedic Surgery | Admitting: Orthopaedic Surgery

## 2014-10-30 ENCOUNTER — Encounter (HOSPITAL_COMMUNITY): Payer: Self-pay

## 2014-10-30 DIAGNOSIS — Z87891 Personal history of nicotine dependence: Secondary | ICD-10-CM | POA: Diagnosis not present

## 2014-10-30 DIAGNOSIS — Z96653 Presence of artificial knee joint, bilateral: Secondary | ICD-10-CM | POA: Diagnosis not present

## 2014-10-30 DIAGNOSIS — E780 Pure hypercholesterolemia: Secondary | ICD-10-CM | POA: Insufficient documentation

## 2014-10-30 DIAGNOSIS — Z01812 Encounter for preprocedural laboratory examination: Secondary | ICD-10-CM | POA: Insufficient documentation

## 2014-10-30 DIAGNOSIS — M069 Rheumatoid arthritis, unspecified: Secondary | ICD-10-CM | POA: Insufficient documentation

## 2014-10-30 DIAGNOSIS — Z01818 Encounter for other preprocedural examination: Secondary | ICD-10-CM | POA: Diagnosis present

## 2014-10-30 LAB — CBC
HCT: 31.1 % — ABNORMAL LOW (ref 39.0–52.0)
Hemoglobin: 9.8 g/dL — ABNORMAL LOW (ref 13.0–17.0)
MCH: 22.2 pg — AB (ref 26.0–34.0)
MCHC: 31.5 g/dL (ref 30.0–36.0)
MCV: 70.5 fL — ABNORMAL LOW (ref 78.0–100.0)
Platelets: 317 10*3/uL (ref 150–400)
RBC: 4.41 MIL/uL (ref 4.22–5.81)
RDW: 16.8 % — AB (ref 11.5–15.5)
WBC: 4 10*3/uL (ref 4.0–10.5)

## 2014-10-30 LAB — BASIC METABOLIC PANEL
ANION GAP: 8 (ref 5–15)
BUN: 8 mg/dL (ref 6–20)
CHLORIDE: 107 mmol/L (ref 101–111)
CO2: 25 mmol/L (ref 22–32)
Calcium: 9.9 mg/dL (ref 8.9–10.3)
Creatinine, Ser: 0.6 mg/dL — ABNORMAL LOW (ref 0.61–1.24)
GFR calc Af Amer: >60 mL/min (ref >60)
GFR calc non Af Amer: >60 mL/min (ref >60)
GLUCOSE: 86 mg/dL (ref 65–99)
POTASSIUM: 3.8 mmol/L (ref 3.5–5.1)
Sodium: 140 mmol/L (ref 135–145)

## 2014-10-30 LAB — SURGICAL PCR SCREEN
MRSA, PCR: NEGATIVE
Staphylococcus aureus: NEGATIVE

## 2014-10-30 NOTE — Progress Notes (Signed)
Call to A. Zelenak, call to review pt. limited range of motion with upper body. PA-C, will come & review pt.

## 2014-10-30 NOTE — Pre-Procedure Instructions (Signed)
Jonathan [name]  10/30/2014      [place], [place] - [address] [place] [address] Phone: [phone] Fax: [phone]    Your procedure is scheduled on 11/11/14- TUESDAY.   Report to [facility] Admitting at 1:30 P.M.   Call this number if you have problems the morning of surgery:  [phone]   Remember:  Do not eat food or drink liquids after midnight.  On MONDAY    Take these medicines the morning of surgery with A SIP OF WATER ---- NOTHING   Do not wear jewelry   Do not wear lotions, powders, or perfumes.  You may wear deodorant.     Men may shave face and neck.   Do not bring valuables to the hospital.   [facility] is not responsible for any belongings or valuables.  Contacts, dentures or bridgework may not be worn into surgery.  Leave your suitcase in the car.  After surgery it may be brought to your room.  For patients admitted to the hospital, discharge time will be determined by your treatment team.  Patients discharged the day of surgery will not be allowed to drive home.   Name and phone number of your driver:   With Cousin    Special instructions:  Special Instructions: [facility] - Preparing for Surgery  Before surgery, you can play an important role.  Because skin is not sterile, your skin needs to be as free of germs as possible.  You can reduce the number of germs on you skin by washing with CHG (chlorahexidine gluconate) soap before surgery.  CHG is an antiseptic cleaner which kills germs and bonds with the skin to continue killing germs even after washing.  Please DO NOT use if you have an allergy to CHG or antibacterial soaps.  If your skin becomes reddened/irritated stop using the CHG and inform your nurse when you arrive at Short Stay.  Do not shave (including legs and underarms) for at least 48 hours prior to the first CHG shower.  You may shave your face.  Please follow  these instructions carefully:   1.  Shower with CHG Soap the night before surgery and the  morning of Surgery.  2.  If you choose to wash your hair, wash your hair first as usual with your  normal shampoo.  3.  After you shampoo, rinse your hair and body thoroughly to remove the  Shampoo.  4.  Use CHG as you would any other liquid soap.  You can apply chg directly to the skin and wash gently with scrungie or a clean washcloth.  5.  Apply the CHG Soap to your body ONLY FROM THE NECK DOWN.    Do not use on open wounds or open sores.  Avoid contact with your eyes, ears, mouth and genitals (private parts).  Wash genitals (private parts)   with your normal soap.  6.  Wash thoroughly, paying special attention to the area where your surgery will be performed.  7.  Thoroughly rinse your body with warm water from the neck down.  8.  DO NOT shower/wash with your normal soap after using and rinsing off   the CHG Soap.  9.  Pat yourself dry with a clean towel.            10.  Wear clean pajamas.  11.  Place clean sheets on your bed the night of your first shower and do not sleep with pets.  Day of Surgery  Do not apply any lotions/deodorants the morning of surgery.  Please wear clean clothes to the hospital/surgery center.  Please read over the following fact sheets that you were given. Pain Booklet, Coughing and Deep Breathing, Blood Transfusion Information, MRSA Information and Surgical Site Infection Prevention

## 2014-10-30 NOTE — Progress Notes (Addendum)
Anesthesia PAT Evaluation: Patient is a 54 year old male scheduled for right THA, anterior approach on 11/11/14 by Dr. Jean Rosenthal.  History includes RA, former smoker, hypercholesterolemia, depression, anemia, bilateral TKA. PCP has been Dr. Antonietta Jewel in [place].  He is actually scheduled to see Dr. Sinclair Ship with [facility] on 11/03/14 to get newly established.    He lives in [place] with his cousin Docia Furl who is his primary caregiver for years. He has given verbal permission to speak with Ms. Brigitte Pulse or [employer] if needed. Home phone is [phone]).  Meds include Percocet. He is not currently on medication for his RA.   He had GI work-up for microcytic anemia in 03/2013. EGD by Dr. Henrene Pastor 04/03/13 showed mild esophagitis and colonoscopy showed four polyps that were removed that showed no evidence of high grade dysplasia or malignancy. He had been on iron supplements but is no longer.  He denied hematochezia.    Patient is a very pleasant black male in [place].  He came to his appointment today alone (used public transportation). He has a flat affect.  He reports his cousins help him with some of his decision making. He uses a wheelchair primarily. His activity is very limited due to RA and severe hip pain with standing.  He may need a left THA in the near future. He denied chest pain, SOB, palpitations, syncope, near syncope. He has chronic BLE edema in his knees and feet. He has joint deformities in his hands and feet associated with RA.  He has some limited ROM, but overall neck mobilities seems mildly reduced.  Mouth opening is 3 FB. He has several missing teeth. Heart RRR, no murmur noted. Lungs clear. 1-2+ pedal/ankle edema, LLE > RLE.  Preoperative labs noted. H/H 9.8/31.1, stable when compared to labs since 07/14/14. HGB 9.7-11.1 since 12/2012.  He will need a T&S. HGB results called to Gem at Dr. Trevor Mace office.   02/10/14 EKG: NSR, minimal voltage criteria  for LVH, ST abnormality, consider early repolarization. I think his EKG is overall stable when compared to 06/15/12 tracing.   02/10/14 CXR: IMPRESSION: There is no evidence of CHF nor pneumonia nor other acute cardiopulmonary abnormality.  02/10/14 Chest CTA: IMPRESSION: 1. No CT evidence for acute pulmonary embolus. 2. Mild bilateral axillary lymphadenopathy of indeterminate etiology. 3. 6 mm ground-glass nodule in the left upper lobe is associated with a 4 mm subpleural nodule in the lingula. The sub solid nodule should be reassessed at 3 months to confirm persistence. Initial follow-up by chest CT without contrast is recommended in 3 months to confirm persistence. This recommendation follows the consensus statement: Recommendations for the Management of Subsolid Pulmonary Nodules Detected at CT: A Statement from the [facility] as published in Radiology 2013; 266:304-317. 4. Cholelithiasis. 5. Probable right renal cyst.  01/2014 Chest CTA results noted after patient left his PAT visit. I called and spoke with patient to verify that he had not had a follow-up chest CT.  I advised that 3 month follow-up had been recommended.  He wanted me to discuss with Docia Furl who was standing there beside him.  She is the one who informed me of patient's 11/03/14 appointment with Dr. Tamala Julian and that results (including today's labs and his 01/2014 chest CTA) could be forwarded to Dr. Tamala Julian for review and recommendations following her evaluation next week. Of note, I did review his labs, EKG, and chest CTA results with anesthesiologist Dr. Kalman Shan. Anesthesia would defer  timing of further evaluation of his abnormal chest CTA to either his surgeon or PCP.  In regards to his anemia, would plan to T&S pre-operatively and have out-patient follow-up since his H/H are stable, he has had GI work-up < 3 years ago, and has been asymptomatic.    If no acute changes then it is anticipated that Mr. Domagalski can  proceed with surgery from an anesthesia standpoint, but we want to confirm he has out-patient follow-up for his on-going anemia and abnormal chest CTA from 01/2014.  He may also benefit from getting re-established with rheumatology, but would defer that to his PCP.  I will contact Dr. Thompson Caul office tomorrow so I can forward her his PAT results and chest CTA.  I will then update Dr. Trevor Mace staff.  George Hugh [facility] Short Stay Center/Anesthesiology Phone [phone] 10/30/2014 6:16 PM  Addendum: I spoke with Cecille Rubin at Dr. Lolita Lenz office regarding abnormal chest CTA results that needed follow-up and that patient was scheduled to see Dr. Tamala Julian on Monday 11/03/14 at [facility]. She had me fax results to [phone] which I did with confirmation. Cecille Rubin said she would give Dr. Thompson Caul medical assistant Reva this information and notify her to expect a fax from me.  I also updated Sherrie at Dr. Trevor Mace office regarding patient's 01/2014 abnormal chest CTA results and that patient is suppose to follow-up with his PCP on 11/03/14, and that anesthesia would defer timing of evaluation of patient's abnormal chest CTA to Dr. Tamala Julian and/or Dr. Ninfa Linden.  George Hugh [facility] Short Stay Center/Anesthesiology Phone [phone] 10/31/2014 3:01 PM

## 2014-11-10 MED ORDER — TRANEXAMIC ACID 1000 MG/10ML IV SOLN
1000.0000 mg | INTRAVENOUS | Status: AC
Start: 2014-11-11 — End: 2014-11-11
  Administered 2014-11-11: 1000 mg via INTRAVENOUS
  Filled 2014-11-10: qty 10

## 2014-11-10 MED ORDER — CEFAZOLIN SODIUM-DEXTROSE 2-3 GM-% IV SOLR
2.0000 g | INTRAVENOUS | Status: AC
Start: 2014-11-11 — End: 2014-11-11
  Administered 2014-11-11: 2 g via INTRAVENOUS
  Filled 2014-11-10: qty 50

## 2014-11-10 NOTE — Progress Notes (Signed)
Pt previously made aware to arrive at 10:30 AM by office staff.

## 2014-11-10 NOTE — Progress Notes (Signed)
Patient called to arrive at 1215. Ok with pt

## 2014-11-11 ENCOUNTER — Inpatient Hospital Stay (HOSPITAL_COMMUNITY): Payer: Medicare Other

## 2014-11-11 ENCOUNTER — Inpatient Hospital Stay (HOSPITAL_COMMUNITY): Payer: Medicare Other | Admitting: Anesthesiology

## 2014-11-11 ENCOUNTER — Encounter (HOSPITAL_COMMUNITY): Payer: Self-pay | Admitting: General Practice

## 2014-11-11 ENCOUNTER — Inpatient Hospital Stay (HOSPITAL_COMMUNITY): Payer: Medicare Other | Admitting: Vascular Surgery

## 2014-11-11 ENCOUNTER — Encounter (HOSPITAL_COMMUNITY)
Admission: RE | Disposition: A | Discharge status: Skilled Nursing Facility | Payer: Self-pay | Source: Ambulatory Visit | Attending: Orthopaedic Surgery

## 2014-11-11 ENCOUNTER — Inpatient Hospital Stay (HOSPITAL_COMMUNITY)
Admission: RE | Admit: 2014-11-11 | Discharge: 2014-11-14 | DRG: 470 | Disposition: A | Discharge status: Skilled Nursing Facility | Payer: Medicare Other | Source: Ambulatory Visit | Attending: Orthopaedic Surgery | Admitting: Orthopaedic Surgery

## 2014-11-11 DIAGNOSIS — F329 Major depressive disorder, single episode, unspecified: Secondary | ICD-10-CM | POA: Diagnosis present

## 2014-11-11 DIAGNOSIS — M247 Protrusio acetabuli: Secondary | ICD-10-CM

## 2014-11-11 DIAGNOSIS — L97919 Non-pressure chronic ulcer of unspecified part of right lower leg with unspecified severity: Secondary | ICD-10-CM | POA: Diagnosis present

## 2014-11-11 DIAGNOSIS — Z79891 Long term (current) use of opiate analgesic: Secondary | ICD-10-CM

## 2014-11-11 DIAGNOSIS — Z96653 Presence of artificial knee joint, bilateral: Secondary | ICD-10-CM | POA: Diagnosis present

## 2014-11-11 DIAGNOSIS — Z87891 Personal history of nicotine dependence: Secondary | ICD-10-CM

## 2014-11-11 DIAGNOSIS — K210 Gastro-esophageal reflux disease with esophagitis: Secondary | ICD-10-CM | POA: Diagnosis present

## 2014-11-11 DIAGNOSIS — Z09 Encounter for follow-up examination after completed treatment for conditions other than malignant neoplasm: Secondary | ICD-10-CM

## 2014-11-11 DIAGNOSIS — M06851 Other specified rheumatoid arthritis, right hip: Secondary | ICD-10-CM | POA: Diagnosis present

## 2014-11-11 DIAGNOSIS — E780 Pure hypercholesterolemia: Secondary | ICD-10-CM | POA: Diagnosis present

## 2014-11-11 DIAGNOSIS — Z96641 Presence of right artificial hip joint: Secondary | ICD-10-CM

## 2014-11-11 DIAGNOSIS — M25551 Pain in right hip: Secondary | ICD-10-CM | POA: Diagnosis present

## 2014-11-11 HISTORY — PX: TOTAL HIP ARTHROPLASTY: SHX124

## 2014-11-11 SURGERY — ARTHROPLASTY, HIP, TOTAL, ANTERIOR APPROACH
Anesthesia: Spinal | Site: Hip | Laterality: Right

## 2014-11-11 MED ORDER — PHENYLEPHRINE HCL 10 MG/ML IJ SOLN
10.0000 mg | INTRAVENOUS | Status: DC | PRN
  Administered 2014-11-11: 15 ug/min via INTRAVENOUS

## 2014-11-11 MED ORDER — LACTATED RINGERS IV SOLN
INTRAVENOUS | Status: DC
  Administered 2014-11-11 (×3): via INTRAVENOUS

## 2014-11-11 MED ORDER — PROPOFOL 10 MG/ML IV BOLUS
INTRAVENOUS | Status: DC | PRN
  Administered 2014-11-11: 20 mg via INTRAVENOUS

## 2014-11-11 MED ORDER — OXYCODONE HCL 5 MG PO TABS
5.0000 mg | ORAL_TABLET | ORAL | Status: DC | PRN
  Administered 2014-11-12 – 2014-11-13 (×2): 10 mg via ORAL
  Filled 2014-11-11 (×2): qty 2

## 2014-11-11 MED ORDER — LIDOCAINE HCL (CARDIAC) 20 MG/ML IV SOLN
INTRAVENOUS | Status: DC | PRN
  Administered 2014-11-11: 20 mg via INTRAVENOUS

## 2014-11-11 MED ORDER — DOCUSATE SODIUM 100 MG PO CAPS
100.0000 mg | ORAL_CAPSULE | Freq: Two times a day (BID) | ORAL | Status: DC
  Administered 2014-11-11 – 2014-11-14 (×6): 100 mg via ORAL
  Filled 2014-11-11 (×8): qty 1

## 2014-11-11 MED ORDER — BUPIVACAINE IN DEXTROSE 0.75-8.25 % IT SOLN
INTRATHECAL | Status: DC | PRN
  Administered 2014-11-11: 1.6 mL via INTRATHECAL

## 2014-11-11 MED ORDER — FENTANYL CITRATE (PF) 250 MCG/5ML IJ SOLN
INTRAMUSCULAR | Status: AC
  Filled 2014-11-11: qty 5

## 2014-11-11 MED ORDER — FENTANYL CITRATE (PF) 100 MCG/2ML IJ SOLN
INTRAMUSCULAR | Status: DC | PRN
  Administered 2014-11-11: 25 ug via INTRAVENOUS
  Administered 2014-11-11: 50 ug via INTRAVENOUS

## 2014-11-11 MED ORDER — ONDANSETRON HCL 4 MG/2ML IJ SOLN
INTRAMUSCULAR | Status: AC
  Filled 2014-11-11: qty 2

## 2014-11-11 MED ORDER — ACETAMINOPHEN 325 MG PO TABS
650.0000 mg | ORAL_TABLET | Freq: Four times a day (QID) | ORAL | Status: DC | PRN
  Administered 2014-11-12: 650 mg via ORAL
  Filled 2014-11-11 (×2): qty 2

## 2014-11-11 MED ORDER — 0.9 % SODIUM CHLORIDE (POUR BTL) OPTIME
TOPICAL | Status: DC | PRN
  Administered 2014-11-11: 1000 mL

## 2014-11-11 MED ORDER — DEXTROSE 5 % IV SOLN
500.0000 mg | Freq: Four times a day (QID) | INTRAVENOUS | Status: DC | PRN
  Filled 2014-11-11: qty 5

## 2014-11-11 MED ORDER — ALUM & MAG HYDROXIDE-SIMETH 200-200-20 MG/5ML PO SUSP
30.0000 mL | ORAL | Status: DC | PRN
Start: 2014-11-11 — End: 2014-11-14

## 2014-11-11 MED ORDER — ASPIRIN EC 325 MG PO TBEC
325.0000 mg | DELAYED_RELEASE_TABLET | Freq: Two times a day (BID) | ORAL | Status: DC
Start: 2014-11-12 — End: 2014-11-14
  Administered 2014-11-12 – 2014-11-14 (×5): 325 mg via ORAL
  Filled 2014-11-11 (×9): qty 1

## 2014-11-11 MED ORDER — PHENOL 1.4 % MT LIQD: 1.0000 | OROMUCOSAL | Status: DC | PRN

## 2014-11-11 MED ORDER — SODIUM CHLORIDE 0.9 % IV SOLN
INTRAVENOUS | Status: DC
  Administered 2014-11-11 – 2014-11-13 (×3): via INTRAVENOUS

## 2014-11-11 MED ORDER — CEFAZOLIN SODIUM 1-5 GM-% IV SOLN
1.0000 g | Freq: Four times a day (QID) | INTRAVENOUS | Status: AC
  Administered 2014-11-11 – 2014-11-12 (×2): 1 g via INTRAVENOUS
  Filled 2014-11-11 (×2): qty 50

## 2014-11-11 MED ORDER — HYDROMORPHONE HCL 1 MG/ML IJ SOLN
1.0000 mg | INTRAMUSCULAR | Status: DC | PRN
  Administered 2014-11-11 – 2014-11-12 (×4): 1 mg via INTRAVENOUS
  Filled 2014-11-11 (×4): qty 1

## 2014-11-11 MED ORDER — ONDANSETRON HCL 4 MG/2ML IJ SOLN: 4.0000 mg | Freq: Four times a day (QID) | INTRAMUSCULAR | Status: DC | PRN

## 2014-11-11 MED ORDER — DEXAMETHASONE SODIUM PHOSPHATE 4 MG/ML IJ SOLN
INTRAMUSCULAR | Status: AC
  Filled 2014-11-11: qty 2

## 2014-11-11 MED ORDER — PROPOFOL INFUSION 10 MG/ML OPTIME
INTRAVENOUS | Status: DC | PRN
  Administered 2014-11-11: 50 ug/kg/min via INTRAVENOUS

## 2014-11-11 MED ORDER — ONDANSETRON HCL 4 MG/2ML IJ SOLN: 4.0000 mg | Freq: Once | INTRAMUSCULAR | Status: DC | PRN

## 2014-11-11 MED ORDER — DIPHENHYDRAMINE HCL 12.5 MG/5ML PO ELIX
12.5000 mg | ORAL_SOLUTION | ORAL | Status: DC | PRN
  Filled 2014-11-11: qty 10

## 2014-11-11 MED ORDER — ACETAMINOPHEN 650 MG RE SUPP: 650.0000 mg | Freq: Four times a day (QID) | RECTAL | Status: DC | PRN

## 2014-11-11 MED ORDER — MEPERIDINE HCL 25 MG/ML IJ SOLN: 6.2500 mg | INTRAMUSCULAR | Status: DC | PRN

## 2014-11-11 MED ORDER — MIDAZOLAM HCL 2 MG/2ML IJ SOLN
INTRAMUSCULAR | Status: AC
  Filled 2014-11-11: qty 2

## 2014-11-11 MED ORDER — PHENYLEPHRINE HCL 10 MG/ML IJ SOLN
INTRAMUSCULAR | Status: AC
  Filled 2014-11-11: qty 1

## 2014-11-11 MED ORDER — METOCLOPRAMIDE HCL 5 MG/ML IJ SOLN: 5.0000 mg | Freq: Three times a day (TID) | INTRAMUSCULAR | Status: DC | PRN

## 2014-11-11 MED ORDER — ONDANSETRON HCL 4 MG PO TABS: 4.0000 mg | ORAL_TABLET | Freq: Four times a day (QID) | ORAL | Status: DC | PRN

## 2014-11-11 MED ORDER — METOCLOPRAMIDE HCL 5 MG PO TABS: 5.0000 mg | ORAL_TABLET | Freq: Three times a day (TID) | ORAL | Status: DC | PRN

## 2014-11-11 MED ORDER — MENTHOL 3 MG MT LOZG
1.0000 | LOZENGE | OROMUCOSAL | Status: DC | PRN
  Filled 2014-11-11: qty 9

## 2014-11-11 MED ORDER — METHOCARBAMOL 500 MG PO TABS
500.0000 mg | ORAL_TABLET | Freq: Four times a day (QID) | ORAL | Status: DC | PRN
  Administered 2014-11-11 – 2014-11-12 (×2): 500 mg via ORAL
  Filled 2014-11-11: qty 1

## 2014-11-11 MED ORDER — PROPOFOL 10 MG/ML IV BOLUS
INTRAVENOUS | Status: AC
  Filled 2014-11-11: qty 20

## 2014-11-11 MED ORDER — HYDROMORPHONE HCL 1 MG/ML IJ SOLN
INTRAMUSCULAR | Status: AC
Start: 2014-11-11 — End: 2014-11-11
  Administered 2014-11-11: 0.5 mg via INTRAVENOUS
  Filled 2014-11-11: qty 1

## 2014-11-11 MED ORDER — ONDANSETRON HCL 4 MG/2ML IJ SOLN
INTRAMUSCULAR | Status: DC | PRN
  Administered 2014-11-11: 4 mg via INTRAVENOUS

## 2014-11-11 MED ORDER — TRANEXAMIC ACID 1000 MG/10ML IV SOLN: 1000.0000 mg | INTRAVENOUS | Status: DC

## 2014-11-11 MED ORDER — HYDROMORPHONE HCL 1 MG/ML IJ SOLN
0.2500 mg | INTRAMUSCULAR | Status: DC | PRN
  Administered 2014-11-11 (×2): 0.5 mg via INTRAVENOUS

## 2014-11-11 MED ORDER — METHOCARBAMOL 500 MG PO TABS
ORAL_TABLET | ORAL | Status: AC
  Filled 2014-11-11: qty 1

## 2014-11-11 MED ORDER — ROCURONIUM BROMIDE 50 MG/5ML IV SOLN
INTRAVENOUS | Status: AC
  Filled 2014-11-11: qty 1

## 2014-11-11 MED ORDER — LIDOCAINE HCL (CARDIAC) 20 MG/ML IV SOLN
INTRAVENOUS | Status: AC
  Filled 2014-11-11: qty 5

## 2014-11-11 MED ORDER — ZOLPIDEM TARTRATE 5 MG PO TABS: 5.0000 mg | ORAL_TABLET | Freq: Every evening | ORAL | Status: DC | PRN

## 2014-11-11 MED ORDER — SODIUM CHLORIDE 0.9 % IR SOLN
Status: DC | PRN
  Administered 2014-11-11: 1000 mL

## 2014-11-11 MED ORDER — MIDAZOLAM HCL 5 MG/5ML IJ SOLN
INTRAMUSCULAR | Status: DC | PRN
  Administered 2014-11-11: 2 mg via INTRAVENOUS

## 2014-11-11 SURGICAL SUPPLY — 54 items
BENZOIN TINCTURE PRP APPL 2/3 (GAUZE/BANDAGES/DRESSINGS) ×3 IMPLANT
BLADE SAW SGTL 18X1.27X75 (BLADE) ×2 IMPLANT
BLADE SAW SGTL 18X1.27X75MM (BLADE) ×1
BLADE SURG ROTATE 9660 (MISCELLANEOUS) IMPLANT
CAPT HIP TOTAL 2 ×3 IMPLANT
CELLS DAT CNTRL 66122 CELL SVR (MISCELLANEOUS) ×1 IMPLANT
CLOSURE WOUND 1/2 X4 (GAUZE/BANDAGES/DRESSINGS) ×2
COVER SURGICAL LIGHT HANDLE (MISCELLANEOUS) ×3 IMPLANT
DRAPE C-ARM 42X72 X-RAY (DRAPES) ×3 IMPLANT
DRAPE STERI IOBAN 125X83 (DRAPES) ×3 IMPLANT
DRAPE U-SHAPE 47X51 STRL (DRAPES) ×9 IMPLANT
DRSG AQUACEL AG ADV 3.5X10 (GAUZE/BANDAGES/DRESSINGS) ×3 IMPLANT
DURAPREP 26ML APPLICATOR (WOUND CARE) ×3 IMPLANT
ELECT BLADE 4.0 EZ CLEAN MEGAD (MISCELLANEOUS) ×3
ELECT BLADE 6.5 EXT (BLADE) IMPLANT
ELECT REM PT RETURN 9FT ADLT (ELECTROSURGICAL) ×3
ELECTRODE BLDE 4.0 EZ CLN MEGD (MISCELLANEOUS) ×1 IMPLANT
ELECTRODE REM PT RTRN 9FT ADLT (ELECTROSURGICAL) ×1 IMPLANT
FACESHIELD WRAPAROUND (MASK) ×6 IMPLANT
GAUZE XEROFORM 5X9 LF (GAUZE/BANDAGES/DRESSINGS) ×3 IMPLANT
GLOVE BIOGEL PI IND STRL 7.0 (GLOVE) ×4 IMPLANT
GLOVE BIOGEL PI IND STRL 8 (GLOVE) ×2 IMPLANT
GLOVE BIOGEL PI INDICATOR 7.0 (GLOVE) ×8
GLOVE BIOGEL PI INDICATOR 8 (GLOVE) ×4
GLOVE ECLIPSE 8.0 STRL XLNG CF (GLOVE) ×3 IMPLANT
GLOVE ORTHO TXT STRL SZ7.5 (GLOVE) ×6 IMPLANT
GOWN STRL REUS W/ TWL LRG LVL3 (GOWN DISPOSABLE) ×2 IMPLANT
GOWN STRL REUS W/ TWL XL LVL3 (GOWN DISPOSABLE) ×2 IMPLANT
GOWN STRL REUS W/TWL LRG LVL3 (GOWN DISPOSABLE) ×4
GOWN STRL REUS W/TWL XL LVL3 (GOWN DISPOSABLE) ×4
HANDPIECE INTERPULSE COAX TIP (DISPOSABLE) ×2
KIT BASIN OR (CUSTOM PROCEDURE TRAY) ×3 IMPLANT
KIT ROOM TURNOVER OR (KITS) ×3 IMPLANT
MANIFOLD NEPTUNE II (INSTRUMENTS) ×3 IMPLANT
NS IRRIG 1000ML POUR BTL (IV SOLUTION) ×3 IMPLANT
PACK TOTAL JOINT (CUSTOM PROCEDURE TRAY) ×3 IMPLANT
PACK UNIVERSAL I (CUSTOM PROCEDURE TRAY) ×3 IMPLANT
PAD ARMBOARD 7.5X6 YLW CONV (MISCELLANEOUS) ×3 IMPLANT
RTRCTR WOUND ALEXIS 18CM MED (MISCELLANEOUS) ×3
SET HNDPC FAN SPRY TIP SCT (DISPOSABLE) ×1 IMPLANT
STAPLER VISISTAT 35W (STAPLE) IMPLANT
STRIP CLOSURE SKIN 1/2X4 (GAUZE/BANDAGES/DRESSINGS) ×4 IMPLANT
SUT ETHIBOND NAB CT1 #1 30IN (SUTURE) ×3 IMPLANT
SUT MNCRL AB 4-0 PS2 18 (SUTURE) IMPLANT
SUT VIC AB 0 CT1 27 (SUTURE) ×2
SUT VIC AB 0 CT1 27XBRD ANBCTR (SUTURE) ×1 IMPLANT
SUT VIC AB 1 CT1 27 (SUTURE) ×2
SUT VIC AB 1 CT1 27XBRD ANBCTR (SUTURE) ×1 IMPLANT
SUT VIC AB 2-0 CT1 27 (SUTURE) ×2
SUT VIC AB 2-0 CT1 TAPERPNT 27 (SUTURE) ×1 IMPLANT
TOWEL OR 17X24 6PK STRL BLUE (TOWEL DISPOSABLE) ×3 IMPLANT
TOWEL OR 17X26 10 PK STRL BLUE (TOWEL DISPOSABLE) ×3 IMPLANT
TRAY FOLEY CATH 16FRSI W/METER (SET/KITS/TRAYS/PACK) IMPLANT
WATER STERILE IRR 1000ML POUR (IV SOLUTION) ×6 IMPLANT

## 2014-11-11 NOTE — H&P (Signed)
TOTAL HIP ADMISSION H&P  Patient is admitted for right total hip arthroplasty.  Subjective:  Chief Complaint: right hip pain  HPI: Jonathan [name], 54 y.o. male, has a history of pain and functional disability in the right hip(s) due to arthritis and patient has failed non-surgical conservative treatments for greater than 12 weeks to include NSAID's and/or analgesics, use of assistive devices and activity modification.  Onset of symptoms was gradual starting 6 years ago with gradually worsening course since that time.The patient noted no past surgery on the right hip(s).  Patient currently rates pain in the right hip at 10 out of 10 with activity. Patient has night pain, worsening of pain with activity and weight bearing, trendelenberg gait, pain that interfers with activities of daily living, pain with passive range of motion and crepitus. Patient has evidence of subchondral sclerosis, periarticular osteophytes, joint space narrowing and protrusio by imaging studies. This condition presents safety issues increasing the risk of falls.  There is no current active infection.  Patient Active Problem List   Diagnosis Date Noted  . 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 Gait difficulty 08/21/2013  . Benign neoplasm of colon 04/03/2013  . Reflux esophagitis 04/03/2013  . Microcytic anemia 04/01/2013  . Chronic ulcer of right leg 01/13/2013  . Pressure ulcer 01/13/2013  . 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 Degenerative arthritis of knee 06/07/2011   Past Medical History  Diagnosis Date  . Arthritis     rheumatoid  . Hypercholesterolemia   . Depression   . Rheumatoid arthritis(714.0)   . Cellulitis   . Anemia     H/o using iron in the past     Past Surgical History  Procedure Laterality Date  . Knee arthroplasty  06/07/2011    Procedure: COMPUTER ASSISTED TOTAL KNEE ARTHROPLASTY;  Surgeon: Mcarthur Rossetti, MD;  Location: [facility];  Service: Orthopedics;   Laterality: Right;  Right total knee arthoplasty  . Knee closed reduction  07/19/2011    Procedure: CLOSED MANIPULATION KNEE;  Surgeon: Mcarthur Rossetti, MD;  Location: [facility];  Service: Orthopedics;  Laterality: Right;  Manipulation under anesthesia right knee  . Joint replacement      L TKA  also right in 05/2011  . Esophagogastroduodenoscopy N/A 04/03/2013    Procedure: ESOPHAGOGASTRODUODENOSCOPY (EGD);  Surgeon: Irene Shipper, MD;  Location: [facility];  Service: Endoscopy;  Laterality: N/A;  changed to moderate dr. Henrene Pastor did not want to go to the o.r. for an endo colon-jmt  . Colonoscopy N/A 04/03/2013    Procedure: COLONOSCOPY;  Surgeon: Irene Shipper, MD;  Location: [facility];  Service: Endoscopy;  Laterality: N/A;  . Eye surgery Left     "when i was a very small boy"    Prescriptions prior to admission  Medication Sig Dispense Refill Last Dose  . oxyCODONE-acetaminophen (PERCOCET/ROXICET) 5-325 MG per tablet Take 1 tablet by mouth every 4 (four) hours as needed for severe pain.   More than a month at Unknown time   No Known Allergies  History  Substance Use Topics  . Smoking status: Former Smoker    Types: Cigarettes    Quit date: 04/25/2004  . Smokeless tobacco: Never Used  . Alcohol Use: No    Family History  Problem Relation Age of Onset  . Anesthesia problems Neg Hx   . Cancer Mother     ?  Marland Kitchen Heart failure Father   . Hypertension Mother   . Cancer Brother  Review of Systems  Musculoskeletal: Positive for back pain, joint pain and neck pain.  All other systems reviewed and are negative.   Objective:  Physical Exam  Constitutional: He is oriented to person, place, and time. He appears well-developed and well-nourished.  HENT:  Head: Normocephalic.  Eyes: Pupils are equal, round, and reactive to light.  Neck: Normal range of motion.  Cardiovascular: Normal rate.   Respiratory: Effort normal and breath sounds normal.  GI: Soft. Bowel sounds are  normal.  Musculoskeletal:       Right hip: He exhibits decreased range of motion, decreased strength, tenderness, bony tenderness and deformity.  Neurological: He is alert and oriented to person, place, and time.  Skin: Skin is warm and dry.  Psychiatric: He has a normal mood and affect.    Vital signs in last 24 hours: Temp:  [98.4 F (36.9 C)] 98.4 F (36.9 C) (07/19 1019) Pulse Rate:  [79] 79 (07/19 1019) Resp:  [18] 18 (07/19 1019) BP: (124)/(79) 124/79 mmHg (07/19 1019) SpO2:  [100 %] 100 % (07/19 1019) Weight:  [57.153 kg (126 lb)] 57.153 kg (126 lb) (07/19 1042)  Labs:   Estimated body mass index is 20.35 kg/(m^2) as calculated from the following:   Height as of this encounter: 5\' 6"  (1.676 m).   Weight as of this encounter: 57.153 kg (126 lb).   Imaging Review Plain radiographs demonstrate severe degenerative joint disease of the right hip(s) with protrusio. The bone quality appears to be fair for age and reported activity level.  Assessment/Plan:  End stage arthritis, right hip(s)  The patient history, physical examination, clinical judgement of the provider and imaging studies are consistent with end stage degenerative joint disease of the right hip(s) and total hip arthroplasty is deemed medically necessary. The treatment options including medical management, injection therapy, arthroscopy and arthroplasty were discussed at length. The risks and benefits of total hip arthroplasty were presented and reviewed. The risks due to aseptic loosening, infection, stiffness, dislocation/subluxation,  thromboembolic complications and other imponderables were discussed.  The patient acknowledged the explanation, agreed to proceed with the plan and consent was signed. Patient is being admitted for inpatient treatment for surgery, pain control, PT, OT, prophylactic antibiotics, VTE prophylaxis, progressive ambulation and ADL's and discharge planning.The patient is planning to be  discharged to skilled nursing facility

## 2014-11-11 NOTE — Anesthesia Preprocedure Evaluation (Signed)
Anesthesia Evaluation  Patient identified by MRN, date of birth, ID band Patient awake    Reviewed: Allergy & Precautions, NPO status , Patient's Chart, lab work & pertinent test results  Airway Mallampati: I  TM Distance: >3 FB Neck ROM: Full    Dental   Pulmonary former smoker,    Pulmonary exam normal       Cardiovascular Normal cardiovascular exam    Neuro/Psych    GI/Hepatic   Endo/Other    Renal/GU      Musculoskeletal   Abdominal   Peds  Hematology   Anesthesia Other Findings   Reproductive/Obstetrics                             Anesthesia Physical Anesthesia Plan  ASA: II  Anesthesia Plan: Spinal   Post-op Pain Management:    Induction: Intravenous  Airway Management Planned: Natural Airway  Additional Equipment:   Intra-op Plan:   Post-operative Plan:   Informed Consent: I have reviewed the patients History and Physical, chart, labs and discussed the procedure including the risks, benefits and alternatives for the proposed anesthesia with the patient or authorized representative who has indicated his/her understanding and acceptance.     Plan Discussed with: CRNA and Surgeon  Anesthesia Plan Comments:         Anesthesia Quick Evaluation

## 2014-11-11 NOTE — Anesthesia Procedure Notes (Signed)
Spinal Patient location during procedure: OR Start time: 11/11/2014 12:40 PM End time: 11/11/2014 12:50 PM Staffing Anesthesiologist: Lillia Abed Performed by: anesthesiologist  Preanesthetic Checklist Completed: patient identified, site marked, surgical consent, pre-op evaluation, timeout performed, IV checked, risks and benefits discussed and monitors and equipment checked Spinal Block Patient position: sitting Prep: Betadine Patient monitoring: heart rate, cardiac monitor, continuous pulse ox and blood pressure Approach: right paramedian Location: L4-5 Injection technique: single-shot Needle Needle type: Pencan  Needle gauge: 24 G Needle length: 9 cm Needle insertion depth: 6 cm

## 2014-11-11 NOTE — Op Note (Signed)
NAME:  Jonathan [name], Jonathan [name] NO.:  [id number]  MEDICAL RECORD NO.:  [id number]  LOCATION:  6E22C                        FACILITY:  [facility]  PHYSICIAN:  Lind Guest. Ninfa Linden, M.D.DATE OF BIRTH:  1960-10-19  DATE OF PROCEDURE:  11/11/2014 DATE OF DISCHARGE:                              OPERATIVE REPORT   PREOPERATIVE DIAGNOSES:  Rheumatoid arthritis with severe protrusio, right hip and debilitating pain.  POSTOPERATIVE DIAGNOSES:  Rheumatoid arthritis with severe protrusio, right hip and debilitating pain.  PROCEDURE:  Right total hip arthroplasty with direct anterior approach.  IMPLANTS:  DePuy Sector Gription acetabular component size 52 with 2 screws, size 36+ 0 neutral polyethylene liner, size 13 Corail femoral component with standard offset, size 36+ 5 ceramic hip ball.  SURGEON:  Lind Guest. Ninfa Linden, M.D.  ASSISTANT:  Erskine Emery, PA-C.  ANESTHESIA:  Spinal.  ANTIBIOTICS:  2 g IV Ancef.  BLOOD LOSS:  450-500 mL.  COMPLICATIONS:  None.  INDICATIONS:  Mr. Griffith is a 54 year old minimal ambulator with severe rheumatoid disease.  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  He lives actually with his right hip quite externally rotated.  It has become debilitating __________ pain.  His mobility has been affected significantly as well as his quality of life.  At this point, he does wish to proceed with total hip arthroplasty.  I talked to him about hip replacement surgery and said this can be quite difficult for him no matter what given his significant protrusio as well as his externally rotated position which makes __________ for a high complication rate for instability.  The risks and benefits of this were well understood by him and he did wish to proceed with surgery.  DESCRIPTION OF PROCEDURE:  After informed consent was obtained, appropriate right hip was marked.  He was brought to the operating room. His  spinal anesthesia was obtained while he was on the stretcher. Traction boots were then placed on his feet.  He was next placed supine on the Hana fracture table with perineal post in place and both legs in an inline skeletal traction, but no traction applied.  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  We prepped his right hip with DuraPrep and sterile drapes.  Time-out was called and he was identified as correct patient and correct right hip.  We then took a standard anterior approach to the hip.  Made an incision inferior and posterior to the anterior superior iliac spine and carried this obliquely down the leg. We dissected down to the hip joint and identified the femoral neck.  I actually needed to identify it under fluoroscopy and visualization.  We then made our femoral neck with an oscillating saw proximal to the lesser trochanter and completed this on osteotome.  We placed a corkscrew guide in the femoral head __________ just melted away, so we had to remove it piecemeal with an osteotome and a rongeur.  We then were able to start reaming under direct visualization and fluoroscopy with each of the reamers, knowing that we could not medialize and going for a rim fit.  We went up  from a size 42 reamer in 2 mm increments to a size 52.  With a size 52, we were pleased with the position.  We then placed a real DePuy Sector Gription acetabular component size 52, trying to be careful not even antevert him much at all and almost put him in a neutral position because of his tendency to __________ in a more externally rotated.  We then secured this also with two screws, even though it was a nice tight fit and the 2 screws __________ tight fit. We then placed a 36+ 0 neutral polyethylene liner for __________ acetabular component.  Attention was then turned to the femur with the leg externally rotated to 100 degrees, extended  and adducted.  We had to remove significant scarring of the capsular tissue from all around the hip just to get exposed to the femoral canal.  Part of the posteromedial femoral neck did sustain fracture, but it did not seem to be significant because once we got done with our tightest as 13 broach, it was significantly stable and the broach did not move.  We trialed a standard neck and a 36+ 1.5 hip ball and brought the leg back over  __________ with traction and internal rotation, reducing the pelvis and he still __________ significantly external rotated position.  He was though stable past 100 degrees even when I brought the leg back down into a semi-extended position.  We brought the leg back up and then with traction, external rotation, we dislocated the hip.  I then placed the real size 13 femoral component with standard offset, and then we decided to go with 36+ 5 hip ball just to make him extra tight given the position that he __________ in anatomically.  We then reduced and verified displacement under direct fluoroscopy and with stability testing.  We then removed all instrumentation and irrigated the hip joint with normal saline solution.  I closed iliotibial band with interrupted #1 Vicryl followed by 0 Vicryl in the deep tissue, 2-0 Vicryl in the subcutaneous tissue, and interrupted staples on the skin. Xeroform and Aquacel dressing was applied.  He was carefully taken off the Hana table into the recovery room in stable condition.  All final counts were correct.  There were no complications noted.  Of note, Erskine Emery, PA-C, assisted during the entire case.  His assistance was crucial for facilitating all aspects of this case.     Lind Guest. Ninfa Linden, M.D.     CYB/MEDQ  D:  11/11/2014  T:  11/11/2014  Job:  [id number]

## 2014-11-11 NOTE — Transfer of Care (Signed)
Immediate Anesthesia Transfer of Care Note  Patient: Jonathan [name]  Procedure(s) Performed: Procedure(s): RIGHT TOTAL HIP ARTHROPLASTY ANTERIOR APPROACH (Right)  Patient Location: PACU  Anesthesia Type:MAC and Spinal  Level of Consciousness: awake, alert  and patient cooperative  Airway & Oxygen Therapy: Patient Spontanous Breathing and Patient connected to nasal cannula oxygen  Post-op Assessment: Report given to RN and Post -op Vital signs reviewed and stable  Post vital signs: Reviewed and stable  Last Vitals:  Filed Vitals:   11/11/14 1510  BP: 104/84  Pulse: 77  Temp: 36.1 C  Resp: 18    Complications: No apparent anesthesia complications

## 2014-11-11 NOTE — OR Nursing (Signed)
Late entry for delay code documentation. 

## 2014-11-11 NOTE — Brief Op Note (Signed)
11/11/2014  2:53 PM  PATIENT:  Jonathan [name]  54 y.o. male  PRE-OPERATIVE DIAGNOSIS:  Primary and rheumatoid arthritis right hip  POST-OPERATIVE DIAGNOSIS:  Primary and rheumatoid arthritis right hip  PROCEDURE:  Procedure(s): RIGHT TOTAL HIP ARTHROPLASTY ANTERIOR APPROACH (Right)  SURGEON:  Surgeon(s) and Role:    * Mcarthur Rossetti, MD - Primary  PHYSICIAN ASSISTANT: Benita Stabile, PA-C  ANESTHESIA:   spinal  EBL:  Total I/O In: 1000 [I.V.:1000] Out: 800 [Urine:300; Blood:500]  BLOOD ADMINISTERED:none  DRAINS: none   LOCAL MEDICATIONS USED:  NONE  SPECIMEN:  No Specimen  DISPOSITION OF SPECIMEN:  N/A  COUNTS:  YES  TOURNIQUET:  * No tourniquets in log *  DICTATION: .Other Dictation: Dictation Number [id number]  PLAN OF CARE: Admit to inpatient   PATIENT DISPOSITION:  PACU - hemodynamically stable.   Delay start of Pharmacological VTE agent (>24hrs) due to surgical blood loss or risk of bleeding: no

## 2014-11-12 LAB — BASIC METABOLIC PANEL
Anion gap: 9 (ref 5–15)
BUN: 6 mg/dL (ref 6–20)
CALCIUM: 8.7 mg/dL — AB (ref 8.9–10.3)
CO2: 24 mmol/L (ref 22–32)
Chloride: 101 mmol/L (ref 101–111)
Creatinine, Ser: 0.6 mg/dL — ABNORMAL LOW (ref 0.61–1.24)
GFR calc Af Amer: >60 mL/min (ref >60)
GFR calc non Af Amer: >60 mL/min (ref >60)
Glucose, Bld: 165 mg/dL — ABNORMAL HIGH (ref 65–99)
Potassium: 3.6 mmol/L (ref 3.5–5.1)
SODIUM: 134 mmol/L — AB (ref 135–145)

## 2014-11-12 LAB — CBC
HCT: 26.8 % — ABNORMAL LOW (ref 39.0–52.0)
Hemoglobin: 8.4 g/dL — ABNORMAL LOW (ref 13.0–17.0)
MCH: 22 pg — AB (ref 26.0–34.0)
MCHC: 31.3 g/dL (ref 30.0–36.0)
MCV: 70.3 fL — ABNORMAL LOW (ref 78.0–100.0)
Platelets: 239 10*3/uL (ref 150–400)
RBC: 3.81 MIL/uL — ABNORMAL LOW (ref 4.22–5.81)
RDW: 16.8 % — ABNORMAL HIGH (ref 11.5–15.5)
WBC: 6.4 10*3/uL (ref 4.0–10.5)

## 2014-11-12 NOTE — Progress Notes (Signed)
Subjective: 1 Day Post-Op Procedure(s) (LRB): RIGHT TOTAL HIP ARTHROPLASTY ANTERIOR APPROACH (Right) Patient reports pain as moderate.  Acute on chronic blood loss anemia - asymptomatic thus far.  Very poorly deconditioned prior to surgery and minimal ambulator.  Objective: Vital signs in last 24 hours: Temp:  [96.9 F (36.1 C)-99.9 F (37.7 C)] 99.9 F (37.7 C) (07/20 0438) Pulse Rate:  [49-108] 97 (07/20 0438) Resp:  [12-21] 17 (07/20 0438) BP: (95-125)/(65-84) 124/65 mmHg (07/20 0438) SpO2:  [98 %-100 %] 98 % (07/20 0438) Weight:  [57.153 kg (126 lb)-67.087 kg (147 lb 14.4 oz)] 67.087 kg (147 lb 14.4 oz) (07/19 2106)  Intake/Output from previous day: 07/19 0701 - 07/20 0700 In: 2931.3 [P.O.:600; I.V.:2281.3; IV Piggyback:50] Out: 1950 [Urine:1450; Blood:500] Intake/Output this shift: Total I/O In: 1431.3 [P.O.:600; I.V.:781.3; IV Piggyback:50] Out: 700 [Urine:700]   Recent Labs  11/12/14 0348  HGB 8.4*    Recent Labs  11/12/14 0348  WBC 6.4  RBC 3.81*  HCT 26.8*  PLT 239    Recent Labs  11/12/14 0348  NA 134*  K 3.6  CL 101  CO2 24  BUN 6  CREATININE 0.60*  GLUCOSE 165*  CALCIUM 8.7*   No results for input(s): LABPT, INR in the last 72 hours.  Sensation intact distally Intact pulses distally Dorsiflexion/Plantar flexion intact Incision: scant drainage  Assessment/Plan: 1 Day Post-Op Procedure(s) (LRB): RIGHT TOTAL HIP ARTHROPLASTY ANTERIOR APPROACH (Right) Up with therapy  Discharge to skilled nursing by the end of the week.  Mcarthur Rossetti 11/12/2014, 6:49 AM

## 2014-11-12 NOTE — Evaluation (Addendum)
Physical Therapy Evaluation Patient Details Name: Jonathan [name] MRN: [id number] DOB: [birth year] Today's Date: 11/12/2014   History of Present Illness  R THA. Pt with RA. Pt was mostly in chair prior to surgery and had aide 7 days a week. Pt reports he walked with cane.   Clinical Impression  Pt admitted with above diagnosis. Pt currently with functional limitations due to the deficits listed below (see PT Problem List).  Pt will benefit from skilled PT to increase their independence and safety with mobility to allow discharge to the venue listed below.  He presents with decreased ROM in joints throughout his body due to RA impacting functional mobility plus pain from surgery.  He will need SNF for rehab.     Follow Up Recommendations  SNF    Equipment Recommendations  None recommended by PT    Recommendations for Other Services       Precautions / Restrictions Precautions Precautions: Fall Restrictions Weight Bearing Restrictions: Yes RLE Weight Bearing: Weight bearing as tolerated      Mobility  Bed Mobility Overal bed mobility: +2 for physical assistance;Needs Assistance Bed Mobility: Supine to Sit;Sit to Supine     Supine to sit: Total assist;+2 for physical assistance Sit to supine: Total assist;+2 for physical assistance   General bed mobility comments: Pt attempting to initiate transfer with LE, but TOT A for transfer to sitting with posterior lean  Transfers                 General transfer comment: Unable to safely attempt today  Ambulation/Gait                Stairs            Wheelchair Mobility    Modified Rankin (Stroke Patients Only)       Balance Overall balance assessment: Needs assistance   Sitting balance-Leahy Scale: Poor Sitting balance - Comments: static balance poor with decrease to zero with any movement of LE Postural control: Posterior lean                                   Pertinent  Vitals/Pain Pain Assessment: 0-10 Pain Score: 9  Pain Location: r hip Pain Descriptors / Indicators: Sharp Pain Intervention(s): Limited activity within patient's tolerance;Monitored during session;Patient requesting pain meds-RN notified;Ice applied;RN gave pain meds during session    [place] expects to be discharged to:: Skilled nursing facility Living Arrangements: Other relatives (cousin)                    Prior Function Level of Independence: Independent with assistive device(s)         Comments: Amb with cane and reports 1 fall in last 6 months.     Hand Dominance        Extremity/Trunk Assessment   Upper Extremity Assessment: Defer to OT evaluation RUE Deficits / Details: BUe limited with shoulder flexion and elbow flexion due to RA         Lower Extremity Assessment: RLE deficits/detail;LLE deficits/detail RLE Deficits / Details: Decreased ankle, knee, and hip P/AROM due to RA with pt keeping hips in abducted position with increased R hip ER LLE Deficits / Details: decreased ankle, knee, and hip P/AROM due to RA      Communication      Cognition Arousal/Alertness: Awake/alert Behavior During Therapy: WFL for tasks assessed/performed Overall Cognitive Status: Within Functional  Limits for tasks assessed                      General Comments General comments (skin integrity, edema, etc.): Pt with limited joint movement/flexibility throughout body. Crepitus noted in shoulders.    Exercises Total Joint Exercises Ankle Circles/Pumps: Other (comment) (L ankle with almost no movement, limited R) Heel Slides: PROM;Right;5 reps Hip ABduction/ADduction: Right;5 reps;PROM Long Arc Quad: Both;Seated      Assessment/Plan    PT Assessment Patient needs continued PT services  PT Diagnosis Difficulty walking;Acute pain;Generalized weakness   PT Problem List Decreased strength;Decreased range of motion;Decreased activity  tolerance;Decreased balance;Decreased mobility;Decreased knowledge of use of DME  PT Treatment Interventions Gait training;Functional mobility training;Therapeutic activities;Therapeutic exercise;DME instruction;Balance training   PT Goals (Current goals can be found in the Care Plan section) Acute Rehab PT Goals Patient Stated Goal: go to rehab PT Goal Formulation: With patient Time For Goal Achievement: 11/26/14 Potential to Achieve Goals: Fair    Frequency Min 3X/week   Barriers to discharge        Co-evaluation   Reason for Co-Treatment: Complexity of the patient's impairments (multi-system involvement);For patient/therapist safety PT goals addressed during session: Mobility/safety with mobility;Balance OT goals addressed during session: ADL's and self-care       End of Session   Activity Tolerance: Patient limited by pain Patient left: in bed;with call bell/phone within reach Nurse Communication: Mobility status         Time: 0254-2706 PT Time Calculation (min) (ACUTE ONLY): 32 min   Charges:   PT Evaluation $Initial PT Evaluation Tier I: 1 Procedure     PT G Codes:        Hilmer Aliberti LUBECK 11/12/2014, 12:54 PM

## 2014-11-12 NOTE — Progress Notes (Signed)
Utilization review completed.  

## 2014-11-12 NOTE — Evaluation (Signed)
Occupational Therapy Evaluation Patient Details Name: Jonathan [name] MRN: [id number] DOB: Feb 07, 1961 Today's Date: 11/12/2014    History of Present Illness R THA. Pt with RA. Pt was mostly in chair prior to surgery and had aide 7 days a week. Pt reports he walked with cane.    Clinical Impression   Pt is s/p THA resulting in the deficits listed below (see OT Problem List).  Pt will benefit from skilled OT to increase their safety and independence with ADL and functional mobility for ADL to facilitate discharge to venue listed below.        Follow Up Recommendations  SNF    Equipment Recommendations  None recommended by OT       Precautions / Restrictions Precautions Precautions: Fall      Mobility Bed Mobility Overal bed mobility: +2 for physical assistance;Needs Assistance Bed Mobility: Supine to Sit     Supine to sit: Total assist;+2 for physical assistance     General bed mobility comments: Upon sitting pt with posterior lean supporting self with BUe on bed  Transfers                 General transfer comment: did not perform         ADL Overall ADL's : Needs assistance/impaired     Grooming: Wash/dry face;Min guard;Sitting Grooming Details (indicate cue type and reason): pt with posterior lean- Min- Max A for sitting balance                               General ADL Comments: Pt did not stand this visit. Only sat EOB     Vision     Perception     Praxis      Pertinent Vitals/Pain Pain Assessment: 0-10 Pain Score: 9  Pain Location: r hip Pain Descriptors / Indicators: Sharp Pain Intervention(s): Limited activity within patient's tolerance;Monitored during session;Patient requesting pain meds-RN notified;Ice applied;RN gave pain meds during session     Hand Dominance     Extremity/Trunk Assessment Upper Extremity Assessment Upper Extremity Assessment: RUE deficits/detail;LUE deficits/detail RUE Deficits / Details: BUe  limited with shoulder flexion and elbow flexion due to RA           Communication     Cognition Arousal/Alertness: Awake/alert Behavior During Therapy: WFL for tasks assessed/performed Overall Cognitive Status: Within Functional Limits for tasks assessed                     General Comments       Exercises       Shoulder Instructions      Home Living Family/patient expects to be discharged to:: Skilled nursing facility Living Arrangements: Other relatives (cousin)                                      Prior Functioning/Environment Level of Independence: Independent with assistive device(s)        Comments: Amb with cane and reports 1 fall in last 6 months.    OT Diagnosis: Generalized weakness;Acute pain   OT Problem List: Decreased strength;Decreased activity tolerance;Impaired balance (sitting and/or standing);Impaired vision/perception;Decreased knowledge of use of DME or AE;Decreased safety awareness;Pain   OT Treatment/Interventions: Self-care/ADL training;DME and/or AE instruction;Patient/family education    OT Goals(Current goals can be found in the care plan section) Acute Rehab OT Goals Patient Stated  Goal: go to rehab OT Goal Formulation: With patient Time For Goal Achievement: 11/26/14 Potential to Achieve Goals: Good  OT Frequency: Min 2X/week   Barriers to D/C: Decreased caregiver support          Co-evaluation PT/OT/SLP Co-Evaluation/Treatment: Yes Reason for Co-Treatment: Complexity of the patient's impairments (multi-system involvement);For patient/therapist safety   OT goals addressed during session: ADL's and self-care      End of Session Nurse Communication: Mobility status;Need for lift equipment  Activity Tolerance: Patient limited by pain Patient left: in bed;with call bell/phone within reach;with bed alarm set   Time: 8099-8338 OT Time Calculation (min): 32 min Charges:  OT General Charges $OT Visit: 1  Procedure OT Evaluation $Initial OT Evaluation Tier I: 1 Procedure G-Codes:    Betsy Pries 13-Nov-2014, 11:44 AM

## 2014-11-12 NOTE — Anesthesia Postprocedure Evaluation (Signed)
  Anesthesia Post-op Note  Patient: Jonathan [name]  Procedure(s) Performed: Procedure(s) (LRB): RIGHT TOTAL HIP ARTHROPLASTY ANTERIOR APPROACH (Right)  Patient Location: PACU  Anesthesia Type: Spinal  Level of Consciousness: awake and alert   Airway and Oxygen Therapy: Patient Spontanous Breathing  Post-op Pain: mild  Post-op Assessment: Post-op Vital signs reviewed, Patient's Cardiovascular Status Stable, Respiratory Function Stable, Patent Airway and No signs of Nausea or vomiting  Last Vitals:  Filed Vitals:   11/12/14 0948  BP: 133/70  Pulse: 107  Temp: 38.2 C  Resp: 18    Post-op Vital Signs: stable   Complications: No apparent anesthesia complications

## 2014-11-13 ENCOUNTER — Encounter (HOSPITAL_COMMUNITY): Payer: Self-pay | Admitting: Orthopaedic Surgery

## 2014-11-13 LAB — CBC
HEMATOCRIT: 25.8 % — AB (ref 39.0–52.0)
HEMOGLOBIN: 8.3 g/dL — AB (ref 13.0–17.0)
MCH: 22.4 pg — ABNORMAL LOW (ref 26.0–34.0)
MCHC: 32.2 g/dL (ref 30.0–36.0)
MCV: 69.7 fL — ABNORMAL LOW (ref 78.0–100.0)
Platelets: 225 10*3/uL (ref 150–400)
RBC: 3.7 MIL/uL — ABNORMAL LOW (ref 4.22–5.81)
RDW: 16.8 % — ABNORMAL HIGH (ref 11.5–15.5)
WBC: 9.8 10*3/uL (ref 4.0–10.5)

## 2014-11-13 NOTE — Clinical Social Work Note (Signed)
Clinical Social Work Assessment  Patient Details  Name: Jonathan [name] MRN: [id number] Date of Birth: 16-May-1960  Date of referral:  11/11/14               Reason for consult:  Facility Placement                Permission sought to share information with:  Family Supports Permission granted to share information::  Yes, Verbal Permission Granted  Name::     Cristi Loron and Docia Furl, cousins.  Agency::     Relationship::  Probation officer Information:  [phone] (h). Patient's cousins live together and Mr. Hoque lives with them.  Housing/Transportation Living arrangements for the past 2 months:  [place] of Information:  Patient Patient Interpreter Needed:  None Criminal Activity/Legal Involvement Pertinent to Current Situation/Hospitalization:    Significant Relationships:   (Cousins) Lives with:  Relatives Do you feel safe going back to the place where you live?  Yes Need for family participation in patient care:  Yes (Comment)  Care giving concerns:  None expressed by patient.   Social Worker assessment / plan:  CSW talked with patient at the bedside. Mr. Eckrich was lying in bed, awake, alert and oriented and was receptive to speaking with CSW about discharge planning. Patient receptive to recommendation of short-term rehab and would like to return [facility] where he has [facility]. CSW given permission to contact either of his cousins if needed. Employment status:  Disabled (Comment on whether or not currently receiving Disability) Insurance information:  Medicare, Medicaid [place] PT Recommendations:  [place] / Referral to community resources:  Other (Comment Required) (None needed or requested at this time.)  Patient/Family's Response to care:  Mr. Binford did not express any concerns with his care at the hospital.  Patient/Family's Understanding of and Emotional Response to Diagnosis, Current  Treatment, and Prognosis:  Not discussed.  Emotional Assessment Appearance:  Appears older than stated age Attitude/Demeanor/Rapport:  Other (Appropriate) Affect (typically observed):  Appropriate, Pleasant Orientation:  Oriented to Self, Oriented to Place, Oriented to  Time, Oriented to Situation Alcohol / Substance use:  Tobacco Use (Patient quit smoking in 2006 and reports that he does not drink or use illicit drugs.) Psych involvement (Current and /or in the community):  No (Comment)  Discharge Needs  Concerns to be addressed:  Discharge Planning Concerns Readmission within the last 30 days:  No Current discharge risk:  None Barriers to Discharge:  No Barriers Identified   Sable Feil, LCSW 11/13/2014, 12:00 PM

## 2014-11-13 NOTE — Progress Notes (Signed)
Subjective: 2 Days Post-Op Procedure(s) (LRB): RIGHT TOTAL HIP ARTHROPLASTY ANTERIOR APPROACH (Right) Patient reports pain as moderate.  Foley still in.  H/H stable.  Slow progress with therapy.  No notes from Social Work yet.  Objective: Vital signs in last 24 hours: Temp:  [98.8 F (37.1 C)-102.7 F (39.3 C)] 99.2 F (37.3 C) (07/21 0606) Pulse Rate:  [89-111] 90 (07/21 0606) Resp:  [17-18] 17 (07/21 0606) BP: (108-133)/(54-73) 111/69 mmHg (07/21 0606) SpO2:  [97 %-100 %] 100 % (07/21 0606) Weight:  [67.2 kg (148 lb 2.4 oz)] 67.2 kg (148 lb 2.4 oz) (07/20 2016)  Intake/Output from previous day: 07/20 0701 - 07/21 0700 In: 3748.8 [P.O.:1080; I.V.:1268.8] Out: 2700 [Urine:2700] Intake/Output this shift:     Recent Labs  11/12/14 0348 11/13/14 0445  HGB 8.4* 8.3*    Recent Labs  11/12/14 0348 11/13/14 0445  WBC 6.4 9.8  RBC 3.81* 3.70*  HCT 26.8* 25.8*  PLT 239 225    Recent Labs  11/12/14 0348  NA 134*  K 3.6  CL 101  CO2 24  BUN 6  CREATININE 0.60*  GLUCOSE 165*  CALCIUM 8.7*   No results for input(s): LABPT, INR in the last 72 hours.  Sensation intact distally Intact pulses distally Dorsiflexion/Plantar flexion intact Incision: dressing C/D/I  Assessment/Plan: 2 Days Post-Op Procedure(s) (LRB): RIGHT TOTAL HIP ARTHROPLASTY ANTERIOR APPROACH (Right) Up with therapy Discharge to SNF tomorrow.  Yonna Alwin Y 11/13/2014, 7:06 AM

## 2014-11-13 NOTE — Care Management Note (Signed)
Case Management Note  Patient Details  Name: Jonathan [name] MRN: [id number] Date of Birth: June 04, 1960  Subjective/Objective:             CM following for progression and d/c planning.       Action/Plan: Noted referral for [facility] and DME, however this pt will d/c to SNF. [place] working with pt . No HH or DME needs at this time.   Expected Discharge Date:  11/14/2014             Expected Discharge Plan:  Skilled Nursing Facility  In-House Referral:  Clinical Social Work  Discharge planning Services  CM Consult  Post Acute Care Choice:  NA Choice offered to:  NA  DME Arranged:    DME Agency:     HH Arranged:    [place] Agency:     Status of Service:     Medicare Important Message Given:    Date Medicare IM Given:    Medicare IM give by:    Date Additional Medicare IM Given:    Additional Medicare Important Message give by:     If discussed at [place] of Stay Meetings, dates discussed:    Additional Comments:  Adron Bene, RN 11/13/2014, 1:23 PM

## 2014-11-13 NOTE — Progress Notes (Signed)
Physical Therapy Treatment Patient Details Name: Jonathan [name] MRN: [id number] DOB: Dec 07, 1960 Today's Date: 11/13/2014    History of Present Illness R THA. Pt with RA. Pt was mostly in chair prior to surgery and had aide 7 days a week. Pt reports he walked with cane.     PT Comments    Worked with pt on sitting control and ability to stretch his LE's specifically to flex hips.  Pt is struggling with ER and extended hips being stiff from chronic position in frog leg stop, now working toward more neutral position.  Follow Up Recommendations  SNF     Equipment Recommendations  None recommended by PT    Recommendations for Other Services       Precautions / Restrictions Precautions Precautions: Fall Restrictions Weight Bearing Restrictions: Yes RLE Weight Bearing: Weight bearing as tolerated    Mobility  Bed Mobility Overal bed mobility: +2 for physical assistance;Needs Assistance Bed Mobility: Supine to Sit;Sit to Supine     Supine to sit: Total assist Sit to supine: Total assist   General bed mobility comments: Pt is actively resisting flexion to get up and is unaware of when he has reached bedside to stop scooting  Transfers                 General transfer comment: Could not get LE's in a position to set up for standing  Ambulation/Gait                 Stairs            Wheelchair Mobility    Modified Rankin (Stroke Patients Only)       Balance Overall balance assessment: Needs assistance Sitting-balance support: Feet supported;Bilateral upper extremity supported Sitting balance-Leahy Scale: Poor Sitting balance - Comments: static balance poor with decrease to zero with any movement of LE Postural control: Posterior lean                          Cognition Arousal/Alertness: Awake/alert Behavior During Therapy: WFL for tasks assessed/performed Overall Cognitive Status: Within Functional Limits for tasks assessed                       Exercises Total Joint Exercises Ankle Circles/Pumps: AROM;Both;10 reps;PROM Gluteal Sets: AAROM;Both;5 reps Short Arc Quad: AAROM;Both;10 reps Heel Slides: AAROM;Both;10 reps    General Comments General comments (skin integrity, edema, etc.): Pt has limited flexion in hips and knees, unable to adequately position feet under him to control for standing attempts      Pertinent Vitals/Pain Pain Assessment: 0-10 Pain Score: 10-Worst pain ever Pain Location: R hip Pain Descriptors / Indicators: Operative site guarding Pain Intervention(s): Limited activity within patient's tolerance;Monitored during session;Premedicated before session;Repositioned    Home Living                      Prior Function            PT Goals (current goals can now be found in the care plan section) Acute Rehab PT Goals Patient Stated Goal: go to rehab Progress towards PT goals: Progressing toward goals    Frequency  Min 3X/week    PT Plan Current plan remains appropriate    Co-evaluation             End of Session   Activity Tolerance: Patient limited by pain Patient left: in bed;with call bell/phone within reach  Time: 5397-6734 PT Time Calculation (min) (ACUTE ONLY): 27 min  Charges:  $Therapeutic Exercise: 8-22 mins $Therapeutic Activity: 8-22 mins                    G Codes:      Ramond Dial 11/18/2014, 1:34 PM  Mee Hives, PT MS Acute Rehab Dept. Number: [facility] [phone] and [place] [phone]

## 2014-11-13 NOTE — Clinical Social Work Placement (Signed)
   CLINICAL SOCIAL WORK PLACEMENT  NOTE  Date:  11/13/2014  Patient Details  Name: Jonathan [name] MRN: [id number] Date of Birth: [birth year]  Clinical Social Work is seeking post-discharge placement for this patient at the [place] level of care (*CSW will initial, date and re-position this form in  chart as items are completed):  No (Patient had a faclity preference)   Patient/family provided with [place] Work Department's list of facilities offering this level of care within the geographic area requested by the patient (or if unable, by the patient's family).  Yes   Patient/family informed of their freedom to choose among providers that offer the needed level of care, that participate in Medicare, Medicaid or managed care program needed by the patient, have an available bed and are willing to accept the patient.  No   Patient/family informed of [facility]'s ownership interest in [facility] and [facility], as well as of the fact that they are under no obligation to receive care at these facilities.  PASRR submitted to EDS on       PASRR number received on       Existing PASRR number confirmed on 11/13/14     FL2 transmitted to all facilities in geographic area requested by pt/family on 11/13/14     FL2 transmitted to all facilities within larger geographic area on       Patient informed that his/her managed care company has contracts with or will negotiate with certain facilities, including the following:        Yes   Patient/family informed of bed offers received.  Patient chooses bed at [facility]     Physician recommends and patient chooses bed at      Patient to be transferred to [facility] on  .  Patient to be transferred to facility by       Patient family notified on   of transfer.  Name of family member notified:        PHYSICIAN       Additional Comment:     _______________________________________________ Sable Feil, LCSW 11/13/2014, 12:05 PM

## 2014-11-14 LAB — CBC
HCT: 24.4 % — ABNORMAL LOW (ref 39.0–52.0)
Hemoglobin: 7.8 g/dL — ABNORMAL LOW (ref 13.0–17.0)
MCH: 22.7 pg — ABNORMAL LOW (ref 26.0–34.0)
MCHC: 32 g/dL (ref 30.0–36.0)
MCV: 70.9 fL — ABNORMAL LOW (ref 78.0–100.0)
PLATELETS: 223 10*3/uL (ref 150–400)
RBC: 3.44 MIL/uL — ABNORMAL LOW (ref 4.22–5.81)
RDW: 17 % — ABNORMAL HIGH (ref 11.5–15.5)
WBC: 7.9 10*3/uL (ref 4.0–10.5)

## 2014-11-14 MED ORDER — METHOCARBAMOL 500 MG PO TABS: 500.0000 mg | ORAL_TABLET | Freq: Four times a day (QID) | ORAL | Status: DC | PRN

## 2014-11-14 MED ORDER — ASPIRIN 325 MG PO TBEC: 325.0000 mg | DELAYED_RELEASE_TABLET | Freq: Two times a day (BID) | ORAL | Status: DC

## 2014-11-14 MED ORDER — OXYCODONE-ACETAMINOPHEN 5-325 MG PO TABS: 1.0000 | ORAL_TABLET | ORAL | Status: DC | PRN

## 2014-11-14 NOTE — Progress Notes (Signed)
Patient ID: Jonathan [name], male   DOB: Jun 11, 1960, 54 y.o.   MRN: [id number] No acute changes.  Vitals stable.  Right hip stable.  Labs not back yet.  Should be able to be discharged to skilled nursing later today.

## 2014-11-14 NOTE — Discharge Instructions (Signed)

## 2014-11-14 NOTE — Progress Notes (Signed)
Patient ID: Jonathan [name], male   DOB: 15-Dec-1960, 54 y.o.   MRN: [id number] Hgb 7.8, but completely asymptomatic with stable vitals.  Pre-op Hgb only 9.8.  Ok to discharge to SNF.

## 2014-11-14 NOTE — Clinical Social Work Placement (Signed)
   CLINICAL SOCIAL WORK PLACEMENT  NOTE  Date:  11/14/2014  Patient Details  Name: Jonathan [name] MRN: [id number] Date of Birth: [birth year]  Clinical Social Work is seeking post-discharge placement for this patient at the [place] level of care (*CSW will initial, date and re-position this form in  chart as items are completed):  No (Patient had a faclity preference)   Patient/family provided with [place] Work Department's list of facilities offering this level of care within the geographic area requested by the patient (or if unable, by the patient's family).  Yes   Patient/family informed of their freedom to choose among providers that offer the needed level of care, that participate in Medicare, Medicaid or managed care program needed by the patient, have an available bed and are willing to accept the patient.  No   Patient/family informed of [facility]'s ownership interest in [facility] and [facility], as well as of the fact that they are under no obligation to receive care at these facilities.  PASRR submitted to EDS on       PASRR number received on       Existing PASRR number confirmed on 11/13/14     FL2 transmitted to all facilities in geographic area requested by pt/family on 11/13/14     FL2 transmitted to all facilities within larger geographic area on       Patient informed that his/her managed care company has contracts with or will negotiate with certain facilities, including the following:        Yes   Patient/family informed of bed offers received.  Patient chooses bed at [facility]     Physician recommends and patient chooses bed at      Patient to be transferred to [facility] on  11/14/14.  Patient to be transferred to facility by  ambulance     Patient family notified on  11/14/14 of transfer.  Name of family member notified:   Cristi Loron, cousin [phone].      PHYSICIAN       Additional Comment:    _______________________________________________ Sable Feil, LCSW 11/14/2014, 2:59 PM

## 2014-11-14 NOTE — Discharge Summary (Signed)
Patient ID: Jonathan [name] MRN: [id number] DOB/AGE: 54-27-1962 54 y.o.  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 Arthritis     rheumatoid  . Hypercholesterolemia   . Depression   . Rheumatoid arthritis(714.0)   . Cellulitis   . Anemia     H/o using iron in the past     Surgeries: Procedure(s): RIGHT TOTAL HIP ARTHROPLASTY ANTERIOR APPROACH on 11/11/2014   Consultants:    Discharged Condition: Improved  Hospital Course: Jonathan [name] is an 54 y.o. male who was admitted 11/11/2014 for operative treatment ofProtrusio acetabuli. Patient has severe unremitting pain that affects sleep, daily activities, and work/hobbies. After pre-op clearance the patient was taken to the operating room on 11/11/2014 and underwent  Procedure(s): RIGHT TOTAL HIP ARTHROPLASTY ANTERIOR APPROACH.    Patient was given perioperative antibiotics: Anti-infectives    Start     Dose/Rate Route Frequency Ordered Stop   11/11/14 1930  ceFAZolin (ANCEF) IVPB 1 g/50 mL premix     1 g 100 mL/hr over 30 Minutes Intravenous Every 6 hours 11/11/14 1846 11/12/14 0152   11/11/14 1345  ceFAZolin (ANCEF) IVPB 2 g/50 mL premix     2 g 100 mL/hr over 30 Minutes Intravenous To [facility] 11/10/14 1214 11/11/14 1239       Patient was given sequential compression devices, early ambulation, and chemoprophylaxis to prevent DVT.  Patient benefited maximally from hospital stay and there were no complications.    Recent vital signs: Patient Vitals for the past 24 hrs:  BP Temp Temp src Pulse Resp SpO2 Weight  11/14/14 0500 105/67 mmHg 98.8 F (37.1 C) Oral 92 16 99 % -  11/13/14 2100 113/61 mmHg (!) 100.6 F (38.1 C) Oral (!) 108 18 100 % 67.4 kg (148 lb 9.4 oz)   11/13/14 1730 113/79 mmHg 98.3 F (36.8 C) Oral (!) 104 18 100 % -  11/13/14 0900 113/66 mmHg 98.6 F (37 C) Oral 90 18 100 % -     Recent laboratory studies:  Recent Labs  11/12/14 0348 11/13/14 0445  WBC 6.4 9.8  HGB 8.4* 8.3*  HCT 26.8* 25.8*  PLT 239 225  NA 134*  --   K 3.6  --   CL 101  --   CO2 24  --   BUN 6  --   CREATININE 0.60*  --   GLUCOSE 165*  --   CALCIUM 8.7*  --      Discharge Medications:     Medication List    TAKE these medications        aspirin 325 MG EC tablet  Take 1 tablet (325 mg total) by mouth 2 (two) times daily after a meal.     methocarbamol 500 MG tablet  Commonly known as:  ROBAXIN  Take 1 tablet (500 mg total) by mouth every 6 (six) hours as needed for muscle spasms.     oxyCODONE-acetaminophen 5-325 MG per tablet  Commonly known as:  PERCOCET/ROXICET  Take 1-2 tablets by mouth every 4 (four) hours as needed for severe pain.        Diagnostic Studies: Dg Hip Port Unilat With Pelvis 1v Right  11/11/2014   CLINICAL DATA:  right total hip replacement  EXAM: DG HIP (WITH OR WITHOUT PELVIS)  1V PORT RIGHT  COMPARISON:  07/09/2013  FINDINGS: Femoral and acetabular components of hip arthroplasty project in expected location. No fracture or dislocation. Right lateral skin staples. Advanced osteoarthritis of the left hip is again noted.  IMPRESSION: 1. Right hip arthroplasty without apparent complication.   Electronically Signed   By: Lucrezia Europe M.D.   On: 11/11/2014 17:35   Dg Hip Operative Unilat With Pelvis Right  11/11/2014   CLINICAL DATA:  Right hip replacement.  EXAM: OPERATIVE right HIP (WITH PELVIS IF PERFORMED) 3 VIEWS  TECHNIQUE: Fluoroscopic spot image(s) were submitted for interpretation post-operatively.  COMPARISON:  None.  FINDINGS: Total right hip replacement with good anatomic alignment. Hardware intact. No acute bony abnormality.  IMPRESSION: Total right hip replacement good anatomic alignment.   Electronically Signed    By: Marcello Moores  Register   On: 11/11/2014 15:21    Disposition: to skilled nursing facility      Discharge Instructions    Discharge patient    Complete by:  As directed               Signed: Mcarthur Rossetti 11/14/2014, 6:37 AM

## 2014-11-15 LAB — TYPE AND SCREEN
ABO/RH(D): B POS
Antibody Screen: NEGATIVE
UNIT DIVISION: 0
Unit division: 0

## 2014-11-17 ENCOUNTER — Non-Acute Institutional Stay (SKILLED_NURSING_FACILITY): Payer: Medicare Other | Admitting: Adult Health

## 2014-11-17 DIAGNOSIS — Z96649 Presence of unspecified artificial hip joint: Secondary | ICD-10-CM | POA: Diagnosis not present

## 2014-11-17 DIAGNOSIS — M247 Protrusio acetabuli: Secondary | ICD-10-CM

## 2014-11-17 DIAGNOSIS — M05752 Rheumatoid arthritis with rheumatoid factor of left hip without organ or systems involvement: Secondary | ICD-10-CM

## 2014-11-17 DIAGNOSIS — Z96641 Presence of right artificial hip joint: Secondary | ICD-10-CM

## 2014-11-18 ENCOUNTER — Non-Acute Institutional Stay (SKILLED_NURSING_FACILITY): Payer: Medicare Other | Admitting: Internal Medicine

## 2014-11-18 DIAGNOSIS — M05752 Rheumatoid arthritis with rheumatoid factor of left hip without organ or systems involvement: Secondary | ICD-10-CM | POA: Diagnosis not present

## 2014-11-18 DIAGNOSIS — Z96649 Presence of unspecified artificial hip joint: Secondary | ICD-10-CM | POA: Diagnosis not present

## 2014-11-18 DIAGNOSIS — M247 Protrusio acetabuli: Secondary | ICD-10-CM

## 2014-11-18 DIAGNOSIS — F329 Major depressive disorder, single episode, unspecified: Secondary | ICD-10-CM | POA: Diagnosis not present

## 2014-11-18 DIAGNOSIS — E785 Hyperlipidemia, unspecified: Secondary | ICD-10-CM

## 2014-11-18 DIAGNOSIS — Z96641 Presence of right artificial hip joint: Secondary | ICD-10-CM

## 2014-11-18 DIAGNOSIS — D509 Iron deficiency anemia, unspecified: Secondary | ICD-10-CM

## 2014-11-18 DIAGNOSIS — F32A Depression, unspecified: Secondary | ICD-10-CM

## 2014-11-21 ENCOUNTER — Non-Acute Institutional Stay (SKILLED_NURSING_FACILITY): Payer: Medicare Other | Admitting: Adult Health

## 2014-11-21 DIAGNOSIS — Z96641 Presence of right artificial hip joint: Secondary | ICD-10-CM

## 2014-11-21 DIAGNOSIS — M247 Protrusio acetabuli: Secondary | ICD-10-CM | POA: Diagnosis not present

## 2014-11-21 DIAGNOSIS — Z96649 Presence of unspecified artificial hip joint: Secondary | ICD-10-CM

## 2015-01-15 ENCOUNTER — Other Ambulatory Visit (HOSPITAL_COMMUNITY): Payer: Self-pay | Admitting: Orthopaedic Surgery

## 2015-01-22 ENCOUNTER — Encounter (HOSPITAL_COMMUNITY): Payer: Self-pay

## 2015-01-22 ENCOUNTER — Encounter (HOSPITAL_COMMUNITY)
Admission: RE | Admit: 2015-01-22 | Discharge: 2015-01-22 | Disposition: A | Discharge status: Home / Self Care | Payer: Medicare Other | Source: Ambulatory Visit | Attending: Orthopaedic Surgery | Admitting: Orthopaedic Surgery

## 2015-01-22 DIAGNOSIS — Z01818 Encounter for other preprocedural examination: Secondary | ICD-10-CM | POA: Diagnosis not present

## 2015-01-22 DIAGNOSIS — M069 Rheumatoid arthritis, unspecified: Secondary | ICD-10-CM | POA: Insufficient documentation

## 2015-01-22 HISTORY — DX: Effusion, unspecified joint: M25.40

## 2015-01-22 HISTORY — DX: Pain in unspecified joint: M25.50

## 2015-01-22 HISTORY — DX: Other chronic pain: M54.9

## 2015-01-22 HISTORY — DX: Other chronic pain: G89.29

## 2015-01-22 LAB — CBC
HEMATOCRIT: 33.4 % — AB (ref 39.0–52.0)
HEMOGLOBIN: 10.3 g/dL — AB (ref 13.0–17.0)
MCH: 22.3 pg — AB (ref 26.0–34.0)
MCHC: 30.8 g/dL (ref 30.0–36.0)
MCV: 72.3 fL — ABNORMAL LOW (ref 78.0–100.0)
Platelets: 293 10*3/uL (ref 150–400)
RBC: 4.62 MIL/uL (ref 4.22–5.81)
RDW: 16.4 % — ABNORMAL HIGH (ref 11.5–15.5)
WBC: 3.9 10*3/uL — ABNORMAL LOW (ref 4.0–10.5)

## 2015-01-22 LAB — BASIC METABOLIC PANEL
ANION GAP: 5 (ref 5–15)
BUN: 7 mg/dL (ref 6–20)
CALCIUM: 9.6 mg/dL (ref 8.9–10.3)
CHLORIDE: 109 mmol/L (ref 101–111)
CO2: 25 mmol/L (ref 22–32)
Creatinine, Ser: 0.51 mg/dL — ABNORMAL LOW (ref 0.61–1.24)
GFR calc non Af Amer: >60 mL/min (ref >60)
GLUCOSE: 85 mg/dL (ref 65–99)
POTASSIUM: 3.4 mmol/L — AB (ref 3.5–5.1)
Sodium: 139 mmol/L (ref 135–145)

## 2015-01-22 LAB — SURGICAL PCR SCREEN
MRSA, PCR: NEGATIVE
Staphylococcus aureus: NEGATIVE

## 2015-01-22 NOTE — Pre-Procedure Instructions (Signed)
    Hyder Deman  01/22/2015      Your procedure is scheduled on : Thursday, October 6.  Report to [facility] Admitting at 8:00 A.M.                Your surgery is scheduled for 10:00 A.M.   Call this number if you have problems the morning of surgery:[phone]                   For any other questions, please call [phone], Monday - Friday 8 AM - 4 PM.   Remember:  Do not eat food or drink liquids after midnight Wednesday, Oct 5.  Take these medicines the morning of surgery with A SIP OF WATER : If needed and you can tolerate on an empty stomach. methocarbamol (ROBAXIN), oxyCODONE-acetaminophen (PERCOCET/ROXICET) .             Stop taking Aspirin today.   Do not wear jewelry, make-up or nail polish.   Do not wear lotions, powders, or perfumes.   Do not shave 48 hours prior to surgery.     Do not bring valuables to the hospital.   [facility] is not responsible for any belongings or valuables.  Contacts, dentures or bridgework may not be worn into surgery.  Leave your suitcase in the car.  After surgery it may be brought to your room.  For patients admitted to the hospital, discharge time will be determined by your treatment team.   Special instructions:  Review  [place] - Preparing For Surgery.  Please read over the following fact sheets that you were given. Pain Booklet, Coughing and Deep Breathing and Surgical Site Infection Prevention

## 2015-01-22 NOTE — Pre-Procedure Instructions (Signed)
Jonathan [name]  01/22/2015      [place], [place] - [address] [place]  [address] Phone: [phone] Fax: [phone]    Your procedure is scheduled on Thurs, Oct 6 @ 10:00 AM  Report to [facility] Admitting at 8:00 AM  Call this number if you have problems the morning of surgery:  [phone]   Remember:  Do not eat food or drink liquids after midnight.  Take these medicines the morning of surgery with A SIP OF WATER Pain Pill(if needed)              Stop taking your Aspirin a week prior to surgery. No Goody's,BC's,Aleve,Ibuprofen,Fish Oil,or any Herbal Medications.    Do not wear jewelry.  Do not wear lotions, powders, or colognes.You may wear deoderant.  Men may shave face and neck.  Do not bring valuables to the hospital.  [facility] is not responsible for any belongings or valuables.  Contacts, dentures or bridgework may not be worn into surgery.  Leave your suitcase in the car.  After surgery it may be brought to your room.  For patients admitted to the hospital, discharge time will be determined by your treatment team.  Patients discharged the day of surgery will not be allowed to drive home.     [facility]  Before surgery, you can play an important role.  Because skin is not sterile, your skin needs to be as free of germs as possible.  You can reduce the number of germs on you skin by washing with CHG (chlorahexidine gluconate) soap before surgery.  CHG is an antiseptic cleaner which kills germs and bonds with the skin to continue killing germs even after washing.  Please DO NOT use if you have an allergy to CHG or antibacterial soaps.  If your skin becomes reddened/irritated stop using the CHG and inform your nurse when you arrive at Short Stay.  Do not shave (including legs and underarms) for at least 48 hours prior to the first CHG shower.  You may  shave your face.  Please follow these instructions carefully:   1.  Shower with CHG Soap the night before surgery and the                                morning of Surgery.  2.  If you choose to wash your hair, wash your hair first as usual with your       normal shampoo.  3.  After you shampoo, rinse your hair and body thoroughly to remove the                      Shampoo.  4.  Use CHG as you would any other liquid soap.  You can apply chg directly       to the skin and wash gently with scrungie or a clean washcloth.  5.  Apply the CHG Soap to your body ONLY FROM THE NECK DOWN.        Do not use on open wounds or open sores.  Avoid contact with your eyes,       ears, mouth and genitals (private parts).  Wash genitals (private parts)       with your normal soap.  6.  Wash thoroughly, paying special attention to the  area where your surgery        will be performed.  7.  Thoroughly rinse your body with warm water from the neck down.  8.  DO NOT shower/wash with your normal soap after using and rinsing off       the CHG Soap.  9.  Pat yourself dry with a clean towel.            10.  Wear clean pajamas.            11.  Place clean sheets on your bed the night of your first shower and do not        sleep with pets.  Day of Surgery  Do not apply any lotions/deoderants the morning of surgery.  Please wear clean clothes to the hospital/surgery center.  ons:    Please read over the following fact sheets that you were given. Pain Booklet, Coughing and Deep Breathing, MRSA Information and Surgical Site Infection Prevention

## 2015-01-22 NOTE — Progress Notes (Addendum)
Cardiologist denies having one  Medical MD is Avelina Laine with first visit with her tomorrow  Echo denies ever having one  Stress test denies ever having one   Heart cath denies ever having one  EKG and CXR in epic from 02-10-14

## 2015-01-23 NOTE — Progress Notes (Signed)
Anesthesia Chart Review:  Pt is 54 year old male scheduled for L total hip arthroplasty anterior approach on 01/29/2015 with Dr. Kathrynn Speed.   PCP is Dr. Sinclair Ship with [facility].   PMH includes: RA, anemia. Former smoker. BMI 21. S/p R THA 11/11/14. S/p computer assisted R TKA 06/07/11.  Please see Myra Gianotti, PA's epic progress note dated 10/30/14 for more history and background information.   Preoperative labs reviewed.     EKG 02/10/2014: NSR, minimal voltage criteria for LVH, ST abnormality, consider early repolarization. I think his EKG is overall stable when compared to 06/15/12 tracing.   02/10/14 CXR: IMPRESSION: There is no evidence of CHF nor pneumonia nor other acute cardiopulmonary abnormality.  02/10/14 Chest CTA: IMPRESSION: 1. No CT evidence for acute pulmonary embolus. 2. Mild bilateral axillary lymphadenopathy of indeterminate etiology. 3. 6 mm ground-glass nodule in the left upper lobe is associated with a 4 mm subpleural nodule in the lingula. The sub solid nodule should be reassessed at 3 months to confirm persistence. Initial follow-up by chest CT without contrast is recommended in 3 months to confirm persistence. This recommendation follows the consensus statement: Recommendations for the Management of Subsolid Pulmonary Nodules Detected at CT: A Statement from the [employer] as published in Radiology 2013; 266:304-317. 4. Cholelithiasis. 5. Probable right renal cyst.  If no changes, I anticipate pt can proceed with surgery as scheduled.   Willeen Cass, FNP-BC [facility] Short Stay Surgical Center/Anesthesiology Phone: [phone] 01/23/2015 12:52 PM

## 2015-01-28 MED ORDER — CEFAZOLIN SODIUM-DEXTROSE 2-3 GM-% IV SOLR
2.0000 g | INTRAVENOUS | Status: AC
  Administered 2015-01-29: 2 g via INTRAVENOUS

## 2015-01-28 MED ORDER — TRANEXAMIC ACID 1000 MG/10ML IV SOLN
1000.0000 mg | INTRAVENOUS | Status: AC
  Administered 2015-01-29: 1000 mg via INTRAVENOUS
  Filled 2015-01-28: qty 10

## 2015-01-28 NOTE — Progress Notes (Signed)
Called patient to arrive at 730 for 930 surgery. Ok spoke with cousin

## 2015-01-29 ENCOUNTER — Encounter (HOSPITAL_COMMUNITY): Payer: Self-pay | Admitting: *Deleted

## 2015-01-29 ENCOUNTER — Inpatient Hospital Stay (HOSPITAL_COMMUNITY): Payer: Medicare Other

## 2015-01-29 ENCOUNTER — Inpatient Hospital Stay (HOSPITAL_COMMUNITY): Payer: Medicare Other | Admitting: Emergency Medicine

## 2015-01-29 ENCOUNTER — Inpatient Hospital Stay (HOSPITAL_COMMUNITY)
Admission: RE | Admit: 2015-01-29 | Discharge: 2015-02-02 | DRG: 470 | Disposition: A | Discharge status: Home Health | Payer: Medicare Other | Source: Ambulatory Visit | Attending: Orthopaedic Surgery | Admitting: Orthopaedic Surgery

## 2015-01-29 ENCOUNTER — Encounter (HOSPITAL_COMMUNITY)
Admission: RE | Disposition: A | Discharge status: Home Health | Payer: Self-pay | Source: Ambulatory Visit | Attending: Orthopaedic Surgery

## 2015-01-29 ENCOUNTER — Inpatient Hospital Stay (HOSPITAL_COMMUNITY): Payer: Medicare Other | Admitting: Anesthesiology

## 2015-01-29 DIAGNOSIS — Z96641 Presence of right artificial hip joint: Secondary | ICD-10-CM | POA: Diagnosis present

## 2015-01-29 DIAGNOSIS — Z96642 Presence of left artificial hip joint: Secondary | ICD-10-CM

## 2015-01-29 DIAGNOSIS — M25552 Pain in left hip: Secondary | ICD-10-CM | POA: Diagnosis present

## 2015-01-29 DIAGNOSIS — M06852 Other specified rheumatoid arthritis, left hip: Secondary | ICD-10-CM | POA: Diagnosis present

## 2015-01-29 DIAGNOSIS — M1612 Unilateral primary osteoarthritis, left hip: Secondary | ICD-10-CM | POA: Diagnosis present

## 2015-01-29 DIAGNOSIS — M069 Rheumatoid arthritis, unspecified: Secondary | ICD-10-CM

## 2015-01-29 DIAGNOSIS — Z8249 Family history of ischemic heart disease and other diseases of the circulatory system: Secondary | ICD-10-CM | POA: Diagnosis not present

## 2015-01-29 DIAGNOSIS — M05752 Rheumatoid arthritis with rheumatoid factor of left hip without organ or systems involvement: Secondary | ICD-10-CM

## 2015-01-29 DIAGNOSIS — D62 Acute posthemorrhagic anemia: Secondary | ICD-10-CM | POA: Diagnosis not present

## 2015-01-29 DIAGNOSIS — Z87891 Personal history of nicotine dependence: Secondary | ICD-10-CM | POA: Diagnosis not present

## 2015-01-29 DIAGNOSIS — Z96652 Presence of left artificial knee joint: Secondary | ICD-10-CM | POA: Diagnosis present

## 2015-01-29 DIAGNOSIS — Z419 Encounter for procedure for purposes other than remedying health state, unspecified: Secondary | ICD-10-CM

## 2015-01-29 HISTORY — PX: TOTAL HIP ARTHROPLASTY: SHX124

## 2015-01-29 SURGERY — ARTHROPLASTY, HIP, TOTAL, ANTERIOR APPROACH
Anesthesia: Monitor Anesthesia Care | Laterality: Left

## 2015-01-29 MED ORDER — PROPOFOL 500 MG/50ML IV EMUL
INTRAVENOUS | Status: DC | PRN
  Administered 2015-01-29: 75 ug/kg/min via INTRAVENOUS

## 2015-01-29 MED ORDER — LACTATED RINGERS IV SOLN
INTRAVENOUS | Status: DC
  Administered 2015-01-29 (×3): via INTRAVENOUS

## 2015-01-29 MED ORDER — METOCLOPRAMIDE HCL 5 MG/ML IJ SOLN
5.0000 mg | Freq: Three times a day (TID) | INTRAMUSCULAR | Status: DC | PRN
Start: 2015-01-29 — End: 2015-02-02

## 2015-01-29 MED ORDER — CEFAZOLIN SODIUM 1-5 GM-% IV SOLN
1.0000 g | Freq: Four times a day (QID) | INTRAVENOUS | Status: AC
  Administered 2015-01-29 – 2015-01-30 (×2): 1 g via INTRAVENOUS
  Filled 2015-01-29 (×2): qty 50

## 2015-01-29 MED ORDER — METOCLOPRAMIDE HCL 5 MG PO TABS: 5.0000 mg | ORAL_TABLET | Freq: Three times a day (TID) | ORAL | Status: DC | PRN

## 2015-01-29 MED ORDER — OXYCODONE HCL 5 MG PO TABS
ORAL_TABLET | ORAL | Status: AC
  Administered 2015-01-29: 10 mg
  Filled 2015-01-29: qty 1

## 2015-01-29 MED ORDER — MENTHOL 3 MG MT LOZG: 1.0000 | LOZENGE | OROMUCOSAL | Status: DC | PRN

## 2015-01-29 MED ORDER — SODIUM CHLORIDE 0.9 % IV SOLN: INTRAVENOUS | Status: DC | PRN

## 2015-01-29 MED ORDER — SODIUM CHLORIDE 0.9 % IR SOLN
Status: DC | PRN
  Administered 2015-01-29: 3000 mL

## 2015-01-29 MED ORDER — ZOLPIDEM TARTRATE 5 MG PO TABS: 5.0000 mg | ORAL_TABLET | Freq: Every evening | ORAL | Status: DC | PRN

## 2015-01-29 MED ORDER — EPHEDRINE SULFATE 50 MG/ML IJ SOLN
INTRAMUSCULAR | Status: DC | PRN
  Administered 2015-01-29: 10 mg via INTRAVENOUS

## 2015-01-29 MED ORDER — HYDROMORPHONE HCL 1 MG/ML IJ SOLN
INTRAMUSCULAR | Status: AC
  Filled 2015-01-29: qty 1

## 2015-01-29 MED ORDER — METHOCARBAMOL 1000 MG/10ML IJ SOLN
500.0000 mg | Freq: Four times a day (QID) | INTRAMUSCULAR | Status: DC | PRN
  Filled 2015-01-29: qty 5

## 2015-01-29 MED ORDER — METHOCARBAMOL 500 MG PO TABS
ORAL_TABLET | ORAL | Status: AC
  Filled 2015-01-29: qty 1

## 2015-01-29 MED ORDER — ACETAMINOPHEN 650 MG RE SUPP: 650.0000 mg | Freq: Four times a day (QID) | RECTAL | Status: DC | PRN

## 2015-01-29 MED ORDER — DIPHENHYDRAMINE HCL 12.5 MG/5ML PO ELIX: 12.5000 mg | ORAL_SOLUTION | ORAL | Status: DC | PRN

## 2015-01-29 MED ORDER — ONDANSETRON HCL 4 MG PO TABS: 4.0000 mg | ORAL_TABLET | Freq: Four times a day (QID) | ORAL | Status: DC | PRN

## 2015-01-29 MED ORDER — METHOCARBAMOL 500 MG PO TABS
500.0000 mg | ORAL_TABLET | Freq: Four times a day (QID) | ORAL | Status: DC | PRN
  Administered 2015-01-29: 500 mg via ORAL

## 2015-01-29 MED ORDER — BUPIVACAINE IN DEXTROSE 0.75-8.25 % IT SOLN: INTRATHECAL | Status: DC | PRN

## 2015-01-29 MED ORDER — DOCUSATE SODIUM 100 MG PO CAPS
100.0000 mg | ORAL_CAPSULE | Freq: Two times a day (BID) | ORAL | Status: DC
  Administered 2015-01-29 – 2015-02-02 (×8): 100 mg via ORAL
  Filled 2015-01-29 (×7): qty 1

## 2015-01-29 MED ORDER — HYDROMORPHONE HCL 1 MG/ML IJ SOLN: 1.0000 mg | INTRAMUSCULAR | Status: DC | PRN

## 2015-01-29 MED ORDER — PHENOL 1.4 % MT LIQD: 1.0000 | OROMUCOSAL | Status: DC | PRN

## 2015-01-29 MED ORDER — SODIUM CHLORIDE 0.9 % IV SOLN
INTRAVENOUS | Status: DC
  Administered 2015-01-29: 18:00:00 via INTRAVENOUS

## 2015-01-29 MED ORDER — HYDROMORPHONE HCL 1 MG/ML IJ SOLN
0.2500 mg | INTRAMUSCULAR | Status: DC | PRN
  Administered 2015-01-29: 0.25 mg via INTRAVENOUS
  Administered 2015-01-29: 0.5 mg via INTRAVENOUS
  Administered 2015-01-29: 0.25 mg via INTRAVENOUS

## 2015-01-29 MED ORDER — ONDANSETRON HCL 4 MG/2ML IJ SOLN: 4.0000 mg | Freq: Four times a day (QID) | INTRAMUSCULAR | Status: DC | PRN

## 2015-01-29 MED ORDER — ACETAMINOPHEN 325 MG PO TABS
650.0000 mg | ORAL_TABLET | Freq: Four times a day (QID) | ORAL | Status: DC | PRN
  Administered 2015-01-30: 650 mg via ORAL
  Filled 2015-01-29: qty 2

## 2015-01-29 MED ORDER — FENTANYL CITRATE (PF) 100 MCG/2ML IJ SOLN
INTRAMUSCULAR | Status: DC | PRN
  Administered 2015-01-29 (×3): 50 ug via INTRAVENOUS

## 2015-01-29 MED ORDER — ONDANSETRON HCL 4 MG/2ML IJ SOLN
INTRAMUSCULAR | Status: DC | PRN
  Administered 2015-01-29: 4 mg via INTRAVENOUS

## 2015-01-29 MED ORDER — OXYCODONE HCL 5 MG PO TABS
5.0000 mg | ORAL_TABLET | ORAL | Status: DC | PRN
  Administered 2015-01-29: 5 mg via ORAL
  Administered 2015-01-29 – 2015-02-02 (×9): 10 mg via ORAL
  Filled 2015-01-29 (×10): qty 2

## 2015-01-29 MED ORDER — MIDAZOLAM HCL 5 MG/5ML IJ SOLN
INTRAMUSCULAR | Status: DC | PRN
  Administered 2015-01-29: 2 mg via INTRAVENOUS

## 2015-01-29 MED ORDER — BUPIVACAINE IN DEXTROSE 0.75-8.25 % IT SOLN
INTRATHECAL | Status: DC | PRN
  Administered 2015-01-29: 1.8 mL via INTRATHECAL

## 2015-01-29 MED ORDER — MIDAZOLAM HCL 2 MG/2ML IJ SOLN
INTRAMUSCULAR | Status: AC
  Filled 2015-01-29: qty 4

## 2015-01-29 MED ORDER — ASPIRIN EC 325 MG PO TBEC
325.0000 mg | DELAYED_RELEASE_TABLET | Freq: Two times a day (BID) | ORAL | Status: DC
  Administered 2015-01-29 – 2015-02-02 (×8): 325 mg via ORAL
  Filled 2015-01-29 (×8): qty 1

## 2015-01-29 MED ORDER — 0.9 % SODIUM CHLORIDE (POUR BTL) OPTIME
TOPICAL | Status: DC | PRN
  Administered 2015-01-29: 1000 mL

## 2015-01-29 MED ORDER — FENTANYL CITRATE (PF) 250 MCG/5ML IJ SOLN
INTRAMUSCULAR | Status: AC
  Filled 2015-01-29: qty 5

## 2015-01-29 MED ORDER — CEFAZOLIN SODIUM-DEXTROSE 2-3 GM-% IV SOLR
INTRAVENOUS | Status: AC
  Filled 2015-01-29: qty 50

## 2015-01-29 MED ORDER — SODIUM CHLORIDE 0.9 % IV SOLN
1000.0000 mg | INTRAVENOUS | Status: DC
  Filled 2015-01-29: qty 10

## 2015-01-29 MED ORDER — ALUM & MAG HYDROXIDE-SIMETH 200-200-20 MG/5ML PO SUSP: 30.0000 mL | ORAL | Status: DC | PRN

## 2015-01-29 SURGICAL SUPPLY — 56 items
BENZOIN TINCTURE PRP APPL 2/3 (GAUZE/BANDAGES/DRESSINGS) ×2 IMPLANT
BLADE SAW SGTL 18X1.27X75 (BLADE) ×2 IMPLANT
BLADE SURG ROTATE 9660 (MISCELLANEOUS) IMPLANT
CAPT HIP TOTAL 2 ×2 IMPLANT
CELLS DAT CNTRL 66122 CELL SVR (MISCELLANEOUS) ×1 IMPLANT
CLSR STERI-STRIP ANTIMIC 1/2X4 (GAUZE/BANDAGES/DRESSINGS) ×2 IMPLANT
COVER SURGICAL LIGHT HANDLE (MISCELLANEOUS) ×2 IMPLANT
DRAPE C-ARM 42X72 X-RAY (DRAPES) ×2 IMPLANT
DRAPE STERI IOBAN 125X83 (DRAPES) ×2 IMPLANT
DRAPE U-SHAPE 47X51 STRL (DRAPES) ×6 IMPLANT
DRSG AQUACEL AG ADV 3.5X10 (GAUZE/BANDAGES/DRESSINGS) ×2 IMPLANT
DURAPREP 26ML APPLICATOR (WOUND CARE) ×2 IMPLANT
ELECT BLADE 4.0 EZ CLEAN MEGAD (MISCELLANEOUS) ×2
ELECT BLADE 6.5 EXT (BLADE) IMPLANT
ELECT REM PT RETURN 9FT ADLT (ELECTROSURGICAL) ×2
ELECTRODE BLDE 4.0 EZ CLN MEGD (MISCELLANEOUS) ×1 IMPLANT
ELECTRODE REM PT RTRN 9FT ADLT (ELECTROSURGICAL) ×1 IMPLANT
FACESHIELD WRAPAROUND (MASK) ×4 IMPLANT
GLOVE BIOGEL PI IND STRL 7.0 (GLOVE) ×1 IMPLANT
GLOVE BIOGEL PI IND STRL 7.5 (GLOVE) ×1 IMPLANT
GLOVE BIOGEL PI IND STRL 8 (GLOVE) ×4 IMPLANT
GLOVE BIOGEL PI INDICATOR 7.0 (GLOVE) ×1
GLOVE BIOGEL PI INDICATOR 7.5 (GLOVE) ×1
GLOVE BIOGEL PI INDICATOR 8 (GLOVE) ×4
GLOVE ECLIPSE 6.5 STRL STRAW (GLOVE) ×2 IMPLANT
GLOVE ECLIPSE 8.0 STRL XLNG CF (GLOVE) ×2 IMPLANT
GLOVE ORTHO TXT STRL SZ7.5 (GLOVE) ×4 IMPLANT
GLOVE SURG SS PI 7.5 STRL IVOR (GLOVE) ×2 IMPLANT
GOWN STRL REUS W/ TWL LRG LVL3 (GOWN DISPOSABLE) ×2 IMPLANT
GOWN STRL REUS W/ TWL XL LVL3 (GOWN DISPOSABLE) ×2 IMPLANT
GOWN STRL REUS W/TWL LRG LVL3 (GOWN DISPOSABLE) ×2
GOWN STRL REUS W/TWL XL LVL3 (GOWN DISPOSABLE) ×2
HANDPIECE INTERPULSE COAX TIP (DISPOSABLE) ×1
KIT BASIN OR (CUSTOM PROCEDURE TRAY) ×2 IMPLANT
KIT ROOM TURNOVER OR (KITS) ×2 IMPLANT
MANIFOLD NEPTUNE II (INSTRUMENTS) ×2 IMPLANT
NS IRRIG 1000ML POUR BTL (IV SOLUTION) ×2 IMPLANT
PACK TOTAL JOINT (CUSTOM PROCEDURE TRAY) ×2 IMPLANT
PACK UNIVERSAL I (CUSTOM PROCEDURE TRAY) ×2 IMPLANT
PAD ARMBOARD 7.5X6 YLW CONV (MISCELLANEOUS) ×2 IMPLANT
RTRCTR WOUND ALEXIS 18CM MED (MISCELLANEOUS) ×2
SET HNDPC FAN SPRY TIP SCT (DISPOSABLE) ×1 IMPLANT
STAPLER VISISTAT 35W (STAPLE) IMPLANT
STRIP CLOSURE SKIN 1/2X4 (GAUZE/BANDAGES/DRESSINGS) ×4 IMPLANT
SUT ETHIBOND NAB CT1 #1 30IN (SUTURE) ×2 IMPLANT
SUT MNCRL AB 4-0 PS2 18 (SUTURE) IMPLANT
SUT VIC AB 0 CT1 27 (SUTURE) ×1
SUT VIC AB 0 CT1 27XBRD ANBCTR (SUTURE) ×1 IMPLANT
SUT VIC AB 1 CT1 27 (SUTURE) ×1
SUT VIC AB 1 CT1 27XBRD ANBCTR (SUTURE) ×1 IMPLANT
SUT VIC AB 2-0 CT1 27 (SUTURE) ×1
SUT VIC AB 2-0 CT1 TAPERPNT 27 (SUTURE) ×1 IMPLANT
TOWEL OR 17X24 6PK STRL BLUE (TOWEL DISPOSABLE) ×2 IMPLANT
TOWEL OR 17X26 10 PK STRL BLUE (TOWEL DISPOSABLE) ×2 IMPLANT
TRAY FOLEY CATH 16FRSI W/METER (SET/KITS/TRAYS/PACK) ×2 IMPLANT
WATER STERILE IRR 1000ML POUR (IV SOLUTION) ×4 IMPLANT

## 2015-01-29 NOTE — Progress Notes (Signed)
Utilization review completed.  

## 2015-01-29 NOTE — Anesthesia Postprocedure Evaluation (Signed)
  Anesthesia Post-op Note  Patient: Jonathan [name]  Procedure(s) Performed: Procedure(s): LEFT TOTAL HIP ARTHROPLASTY ANTERIOR APPROACH (Left)  Patient Location: PACU  Anesthesia Type: Spinal/MAC  Level of Consciousness: awake and alert   Airway and Oxygen Therapy: Patient Spontanous Breathing  Post-op Pain: Controlled  Post-op Assessment: Post-op Vital signs reviewed, Patient's Cardiovascular Status Stable and Respiratory Function Stable. Block receeding.  Post-op Vital Signs: Reviewed  Filed Vitals:   01/29/15 1430  BP: 111/72  Pulse: 57  Temp:   Resp: 16    Complications: No apparent anesthesia complications

## 2015-01-29 NOTE — Brief Op Note (Signed)
01/29/2015  11:50 AM  PATIENT:  Jani Files  54 y.o. male  PRE-OPERATIVE DIAGNOSIS:  Severe rheumatoid arthritis left hip  POST-OPERATIVE DIAGNOSIS:  Severe rheumatoid arthritis left hip  PROCEDURE:  Procedure(s): LEFT TOTAL HIP ARTHROPLASTY ANTERIOR APPROACH (Left)  SURGEON:  Surgeon(s) and Role:    * Mcarthur Rossetti, MD - Primary  PHYSICIAN ASSISTANT: Benita Stabile, PA-C  ANESTHESIA:   spinal  EBL:  Total I/O In: 1000 [I.V.:1000] Out: 350 [Blood:350]  BLOOD ADMINISTERED:none  DRAINS: none   LOCAL MEDICATIONS USED:  NONE  SPECIMEN:  No Specimen  DISPOSITION OF SPECIMEN:  N/A  COUNTS:  YES  TOURNIQUET:  * No tourniquets in log *  DICTATION: .Other Dictation: Dictation Number [id number]  PLAN OF CARE: Admit to inpatient   PATIENT DISPOSITION:  PACU - hemodynamically stable.   Delay start of Pharmacological VTE agent (>24hrs) due to surgical blood loss or risk of bleeding: no

## 2015-01-29 NOTE — Anesthesia Preprocedure Evaluation (Addendum)
Anesthesia Evaluation  Patient identified by MRN, date of birth, ID band Patient awake    Reviewed: Allergy & Precautions, H&P , NPO status , Patient's Chart, lab work & pertinent test results  Airway Mallampati: III  TM Distance: >3 FB Neck ROM: Limited    Dental no notable dental hx. (+) Poor Dentition, Dental Advisory Given   Pulmonary neg pulmonary ROS, former smoker,    Pulmonary exam normal breath sounds clear to auscultation       Cardiovascular negative cardio ROS   Rhythm:Regular Rate:Normal     Neuro/Psych negative neurological ROS  negative psych ROS   GI/Hepatic negative GI ROS, Neg liver ROS,   Endo/Other  negative endocrine ROS  Renal/GU negative Renal ROS  negative genitourinary   Musculoskeletal  (+) Arthritis , Rheumatoid disorders,    Abdominal   Peds  Hematology negative hematology ROS (+)   Anesthesia Other Findings   Reproductive/Obstetrics negative OB ROS                            Anesthesia Physical Anesthesia Plan  ASA: II  Anesthesia Plan: MAC and Spinal   Post-op Pain Management:    Induction: Intravenous  Airway Management Planned: Simple Face Mask  Additional Equipment:   Intra-op Plan:   Post-operative Plan:   Informed Consent: I have reviewed the patients History and Physical, chart, labs and discussed the procedure including the risks, benefits and alternatives for the proposed anesthesia with the patient or authorized representative who has indicated his/her understanding and acceptance.   Dental advisory given  Plan Discussed with: CRNA  Anesthesia Plan Comments:         Anesthesia Quick Evaluation

## 2015-01-29 NOTE — Anesthesia Procedure Notes (Addendum)
Spinal Patient location during procedure: OR Start time: 01/29/2015 10:08 AM End time: 01/29/2015 10:15 AM Staffing Anesthesiologist: Roderic Palau Performed by: anesthesiologist  Preanesthetic Checklist Completed: patient identified, surgical consent, pre-op evaluation, timeout performed, IV checked, risks and benefits discussed and monitors and equipment checked Spinal Block Patient position: sitting Prep: DuraPrep Patient monitoring: cardiac monitor, continuous pulse ox and blood pressure Approach: midline Location: L3-4 Injection technique: single-shot Needle Needle type: Quincke (Attempted Pencan 24G x 1)  Needle gauge: 22 G Needle length: 9 cm Assessment Sensory level: T6 Additional Notes Functioning IV was confirmed and monitors were applied. Sterile prep and drape, including hand hygiene and sterile gloves were used. The patient was positioned and the spine was prepped. The skin was anesthetized with lidocaine.  Free flow of clear CSF was obtained prior to injecting local anesthetic into the CSF.  The spinal needle aspirated freely following injection.  The needle was carefully withdrawn.  The patient tolerated the procedure well.   Procedure Name: MAC Date/Time: 01/29/2015 10:19 AM Performed by: Willeen Cass P Pre-anesthesia Checklist: Patient identified, Timeout performed, Emergency Drugs available, Suction available and Patient being monitored Patient Re-evaluated:Patient Re-evaluated prior to inductionOxygen Delivery Method: Nasal cannula Intubation Type: IV induction Placement Confirmation: positive ETCO2 Dental Injury: Teeth and Oropharynx as per pre-operative assessment

## 2015-01-29 NOTE — Transfer of Care (Signed)
Immediate Anesthesia Transfer of Care Note  Patient: Jonathan [name]  Procedure(s) Performed: Procedure(s): LEFT TOTAL HIP ARTHROPLASTY ANTERIOR APPROACH (Left)  Patient Location: PACU  Anesthesia Type:MAC  Level of Consciousness: awake, alert , oriented and patient cooperative  Airway & Oxygen Therapy: Patient Spontanous Breathing and Patient connected to nasal cannula oxygen  Post-op Assessment: Report given to RN, Post -op Vital signs reviewed and stable and Patient moving all extremities  Post vital signs: Reviewed and stable  Last Vitals:  BP 105/79 HR 80 SpO2 98% on 2L [place] RR 14  Complications: No apparent anesthesia complications

## 2015-01-29 NOTE — H&P (Signed)
TOTAL HIP ADMISSION H&P  Patient is admitted for left total hip arthroplasty.  Subjective:  Chief Complaint: left hip pain  HPI: Jonathan [name], 54 y.o. male, has a history of pain and functional disability in the left hip(s) due to arthritis and patient has failed non-surgical conservative treatments for greater than 12 weeks to include NSAID's and/or analgesics, flexibility and strengthening excercises, supervised PT with diminished ADL's post treatment, use of assistive devices and activity modification.  Onset of symptoms was gradual starting 10 years ago with gradually worsening course since that time.The patient noted no past surgery on the left hip(s).  Patient currently rates pain in the left hip at 10 out of 10 with activity. Patient has night pain, worsening of pain with activity and weight bearing, trendelenberg gait, pain that interfers with activities of daily living, pain with passive range of motion and crepitus. Patient has evidence of subchondral cysts, subchondral sclerosis, periarticular osteophytes, joint space narrowing and protrusio by imaging studies. This condition presents safety issues increasing the risk of falls.  There is no current active infection.  Patient Active Problem List   Diagnosis Date Noted  . Rheumatoid arthritis involving left hip ([place]) 01/29/2015  . 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 Status post total replacement of right hip 11/11/2014  . Gait difficulty 08/21/2013  . Benign neoplasm of colon 04/03/2013  . Reflux esophagitis 04/03/2013  . Microcytic anemia 04/01/2013  . Chronic ulcer of right leg ([place]) 01/13/2013  . Pressure ulcer 01/13/2013  . 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 Degenerative arthritis of knee 06/07/2011   Past Medical History  Diagnosis Date  . Arthritis     rheumatoid  . Rheumatoid arthritis(714.0)   . Anemia     H/o using iron in the past   . Joint pain   . Joint swelling   . Chronic  back pain     Past Surgical History  Procedure Laterality Date  . Knee arthroplasty  06/07/2011    Procedure: COMPUTER ASSISTED TOTAL KNEE ARTHROPLASTY;  Surgeon: Mcarthur Rossetti, MD;  Location: [facility];  Service: Orthopedics;  Laterality: Right;  Right total knee arthoplasty  . Knee closed reduction  07/19/2011    Procedure: CLOSED MANIPULATION KNEE;  Surgeon: Mcarthur Rossetti, MD;  Location: [facility];  Service: Orthopedics;  Laterality: Right;  Manipulation under anesthesia right knee  . Joint replacement      L TKA  also right in 05/2011  . Esophagogastroduodenoscopy N/A 04/03/2013    Procedure: ESOPHAGOGASTRODUODENOSCOPY (EGD);  Surgeon: Irene Shipper, MD;  Location: [facility];  Service: Endoscopy;  Laterality: N/A;  changed to moderate dr. Henrene Pastor did not want to go to the o.r. for an endo colon-jmt  . Colonoscopy N/A 04/03/2013    Procedure: COLONOSCOPY;  Surgeon: Irene Shipper, MD;  Location: [facility];  Service: Endoscopy;  Laterality: N/A;  . Eye surgery Left     "when i was a very small boy"  . Total hip arthroplasty Right 11/11/2014    Procedure: RIGHT TOTAL HIP ARTHROPLASTY ANTERIOR APPROACH;  Surgeon: Mcarthur Rossetti, MD;  Location: [facility];  Service: Orthopedics;  Laterality: Right;    Prescriptions prior to admission  Medication Sig Dispense Refill Last Dose  . aspirin EC 325 MG EC tablet Take 1 tablet (325 mg total) by mouth 2 (two) times daily after a meal. (Patient not taking: Reported on 01/22/2015) 30 tablet 0 Not Taking at Unknown time  . methocarbamol (ROBAXIN) 500  MG tablet Take 1 tablet (500 mg total) by mouth every 6 (six) hours as needed for muscle spasms. 30 tablet 0 More than a month at Unknown time   No Known Allergies  Social History  Substance Use Topics  . Smoking status: Former Smoker    Types: Cigarettes    Quit date: 04/25/2004  . Smokeless tobacco: Never Used  . Alcohol Use: No    Family History  Problem Relation Age of Onset  .  Anesthesia problems Neg Hx   . Cancer Mother     ?  Marland Kitchen Heart failure Father   . Hypertension Mother   . Cancer Brother      Review of Systems  Musculoskeletal: Positive for joint pain.  All other systems reviewed and are negative.   Objective:  Physical Exam  Constitutional: He is oriented to person, place, and time. He appears well-developed and well-nourished.  HENT:  Head: Normocephalic and atraumatic.  Eyes: EOM are normal. Pupils are equal, round, and reactive to light.  Neck: Normal range of motion. Neck supple.  Cardiovascular: Normal rate and regular rhythm.   Respiratory: Effort normal and breath sounds normal.  GI: Soft. Bowel sounds are normal.  Musculoskeletal:       Left hip: He exhibits decreased range of motion, decreased strength, tenderness, bony tenderness and deformity.  Neurological: He is alert and oriented to person, place, and time.  Skin: Skin is warm and dry.  Psychiatric: He has a normal mood and affect.    Vital signs in last 24 hours: Temp:  [98.5 F (36.9 C)] 98.5 F (36.9 C) (10/06 0732) Pulse Rate:  [80] 80 (10/06 0732) Resp:  [20] 20 (10/06 0732) BP: (135)/(77) 135/77 mmHg (10/06 0732) SpO2:  [100 %] 100 % (10/06 0732) Weight:  [69.854 kg (154 lb)] 69.854 kg (154 lb) (10/06 0732)  Labs:   Estimated body mass index is 20.88 kg/(m^2) as calculated from the following:   Height as of this encounter: 6' (1.829 m).   Weight as of this encounter: 69.854 kg (154 lb).   Imaging Review Plain radiographs demonstrate severe degenerative joint disease of the left hip(s). The bone quality appears to be fair for age and reported activity level.  Assessment/Plan:  End stage arthritis, left hip(s)  The patient history, physical examination, clinical judgement of the provider and imaging studies are consistent with end stage degenerative joint disease of the left hip(s) and total hip arthroplasty is deemed medically necessary. The treatment options  including medical management, injection therapy, arthroscopy and arthroplasty were discussed at length. The risks and benefits of total hip arthroplasty were presented and reviewed. The risks due to aseptic loosening, infection, stiffness, dislocation/subluxation,  thromboembolic complications and other imponderables were discussed.  The patient acknowledged the explanation, agreed to proceed with the plan and consent was signed. Patient is being admitted for inpatient treatment for surgery, pain control, PT, OT, prophylactic antibiotics, VTE prophylaxis, progressive ambulation and ADL's and discharge planning.The patient is planning to be discharged home with home health services

## 2015-01-30 ENCOUNTER — Encounter (HOSPITAL_COMMUNITY): Payer: Self-pay | Admitting: Orthopaedic Surgery

## 2015-01-30 LAB — CBC
HCT: 27.3 % — ABNORMAL LOW (ref 39.0–52.0)
Hemoglobin: 8.6 g/dL — ABNORMAL LOW (ref 13.0–17.0)
MCH: 22.5 pg — ABNORMAL LOW (ref 26.0–34.0)
MCHC: 31.5 g/dL (ref 30.0–36.0)
MCV: 71.5 fL — AB (ref 78.0–100.0)
PLATELETS: 230 10*3/uL (ref 150–400)
RBC: 3.82 MIL/uL — ABNORMAL LOW (ref 4.22–5.81)
RDW: 16.2 % — AB (ref 11.5–15.5)
WBC: 5.9 10*3/uL (ref 4.0–10.5)

## 2015-01-30 LAB — BASIC METABOLIC PANEL
ANION GAP: 12 (ref 5–15)
BUN: <5 mg/dL — ABNORMAL LOW (ref 6–20)
CALCIUM: 9 mg/dL (ref 8.9–10.3)
CO2: 24 mmol/L (ref 22–32)
CREATININE: 0.53 mg/dL — AB (ref 0.61–1.24)
Chloride: 99 mmol/L — ABNORMAL LOW (ref 101–111)
GLUCOSE: 127 mg/dL — AB (ref 65–99)
Potassium: 3.4 mmol/L — ABNORMAL LOW (ref 3.5–5.1)
Sodium: 135 mmol/L (ref 135–145)

## 2015-01-30 NOTE — Care Management Note (Signed)
Case Management Note  Patient Details  Name: Matthew Cina MRN: [id number] Date of Birth: 1960-07-13  Subjective/Objective:           S/p left total hip arthroplasty         Action/Plan: Set up with [facility] for HHPT by MD office. PT and OT recommending SNF. Referral made to CSW. CM will continue to follow.    Expected Discharge Date:                  Expected Discharge Plan:  Skilled Nursing Facility  In-House Referral:  Clinical Social Work  Discharge planning Services  CM Consult  Post Acute Care Choice:    Choice offered to:     DME Arranged:    DME Agency:     HH Arranged:    [place] Agency:     Status of Service:  In process, will continue to follow  Medicare Important Message Given:    Date Medicare IM Given:    Medicare IM give by:    Date Additional Medicare IM Given:    Additional Medicare Important Message give by:     If discussed at [place] of Stay Meetings, dates discussed:    Additional Comments:  Nila Nephew, RN 01/30/2015, 11:45 AM

## 2015-01-30 NOTE — Clinical Social Work Placement (Signed)
   CLINICAL SOCIAL WORK PLACEMENT  NOTE  Date:  01/30/2015  Patient Details  Name: Jonathan [name] MRN: [id number] Date of Birth: 05-Jan-1961  Clinical Social Work is seeking post-discharge placement for this patient at the [place] level of care (*CSW will initial, date and re-position this form in  chart as items are completed):  Yes   Patient/family provided with [place] Work Department's list of facilities offering this level of care within the geographic area requested by the patient (or if unable, by the patient's family).  Yes   Patient/family informed of their freedom to choose among providers that offer the needed level of care, that participate in Medicare, Medicaid or managed care program needed by the patient, have an available bed and are willing to accept the patient.  Yes   Patient/family informed of [facility]'s ownership interest in [facility] and [facility], as well as of the fact that they are under no obligation to receive care at these facilities.  PASRR submitted to EDS on  (n/a)     PASRR number received on  (n/a)     Existing PASRR number confirmed on 01/30/15     FL2 transmitted to all facilities in geographic area requested by pt/family on 01/30/15     FL2 transmitted to all facilities within larger geographic area on  (n/a)     Patient informed that his/her managed care company has contracts with or will negotiate with certain facilities, including the following:            Patient/family informed of bed offers received.  Patient chooses bed at       Physician recommends and patient chooses bed at      Patient to be transferred to   on  .  Patient to be transferred to facility by       Patient family notified on   of transfer.  Name of family member notified:        PHYSICIAN Please sign FL2     Additional Comment:    _______________________________________________ Caroline Sauger,  LCSW 01/30/2015, 4:06 PM

## 2015-01-30 NOTE — Evaluation (Signed)
Physical Therapy Evaluation Patient Details Name: Jonathan [name] MRN: [id number] DOB: 31-Oct-1960 Today's Date: 01/30/2015   History of Present Illness  LEFT TOTAL HIP ARTHROPLASTY ANTERIOR APPROACH (Left), history of chronic back pain, rhumatoid arthritis.  Clinical Impression  Pt is s/p THA with the deficits listed below (see PT Problem List).  Pt will benefit from skilled PT to increase their independence and safety with mobility to allow discharge to SNF. Patient currently requiring +2 assistance for all bed mobility and transfers. Started discussion with patient regarding possible need for further rehabilitation before returning to home based upon current assistance needed for mobility. Patient reporting that he thinks that his cousin will be able to help him enough at home.      Follow Up Recommendations SNF;Supervision/Assistance - 24 hour    Equipment Recommendations  Other (comment);None recommended by PT (has rw at home)    Recommendations for Other Services       Precautions / Restrictions Precautions Precautions: Fall Restrictions Weight Bearing Restrictions: Yes LLE Weight Bearing: Weight bearing as tolerated      Mobility  Bed Mobility Overal bed mobility: +2 for physical assistance;Needs Assistance Bed Mobility: Supine to Sit     Supine to sit: Mod assist;+2 for physical assistance (pivot to sitting) Sit to supine: Mod assist;+2 for physical assistance (Use of bed pad to scoot hips)   General bed mobility comments: pivot to edge of bed, cues for patient to assist  Transfers Overall transfer level: Needs assistance Equipment used: Rolling walker (2 wheeled) Transfers: Sit to/from Stand Sit to Stand: +2 physical assistance;Mod assist;From elevated surface         General transfer comment: cues needed for hand position and encouragement for posture and weightbearing. [place] standing, bed removed and chaiir brought from behind to allow for sitting up in  chair.   Ambulation/Gait             General Gait Details: not safe to perform at this time.   Stairs            Wheelchair Mobility    Modified Rankin (Stroke Patients Only)       Balance Overall balance assessment: Needs assistance Sitting-balance support: Bilateral upper extremity supported Sitting balance-Leahy Scale: Poor     Standing balance support: Bilateral upper extremity supported Standing balance-Leahy Scale: Poor Standing balance comment: using rw                             Pertinent Vitals/Pain Pain Assessment: 0-10 Pain Score: 9  Pain Location: Lt hip Pain Descriptors / Indicators: Sharp Pain Intervention(s): Limited activity within patient's tolerance;Monitored during session;Repositioned;Other (comment) (nursing aware of pain level)    Home Living Family/patient expects to be discharged to:: Private residence Living Arrangements: Other relatives;Other (Comment) (cousin) Available Help at Discharge: Family;Other (Comment) (cousin and daily home health aid to assist with dressing) Type of Home: House Home Access: Level entry     Home Layout: One level Home Equipment: Walker - 2 wheels      Prior Function Level of Independence: Needs assistance   Gait / Transfers Assistance Needed: using rw prior to admission  ADL's / Homemaking Assistance Needed: Pt reports that he has a [place] aide that assists with bathing and dressing PTA        Hand Dominance        Extremity/Trunk Assessment   Upper Extremity Assessment: RUE deficits/detail;LUE deficits/detail RUE Deficits / Details: Limited ROM and  strength due to RA, pt reports this was present PTA     LUE Deficits / Details: Limited ROM and strength due to RA, pt reports this was present PTA   Lower Extremity Assessment: Generalized weakness         Communication   Communication: No difficulties  Cognition Arousal/Alertness: Awake/alert Behavior During Therapy: WFL  for tasks assessed/performed Overall Cognitive Status: Within Functional Limits for tasks assessed (poor awareness of current deficits)                      General Comments General comments (skin integrity, edema, etc.): Pt reports that he has RA which limits his ability to participate in ADLs PTA.     Exercises        Assessment/Plan    PT Assessment Patient needs continued PT services  PT Diagnosis Difficulty walking;Generalized weakness;Acute pain   PT Problem List Decreased strength;Decreased range of motion;Decreased activity tolerance;Decreased balance;Decreased mobility;Pain  PT Treatment Interventions DME instruction;Gait training;Stair training;Functional mobility training;Therapeutic activities;Therapeutic exercise;Balance training;Patient/family education   PT Goals (Current goals can be found in the Care Plan section) Acute Rehab PT Goals Patient Stated Goal: be able to go home PT Goal Formulation: With patient Time For Goal Achievement: 02/13/15 Potential to Achieve Goals: Fair    Frequency 7X/week   Barriers to discharge        Co-evaluation               End of Session Equipment Utilized During Treatment: Gait belt Activity Tolerance: Patient limited by pain Patient left: in chair;with call bell/phone within reach;with nursing/sitter in room Nurse Communication: Mobility status;Need for lift equipment;Weight bearing status         Time: 8127-5170 PT Time Calculation (min) (ACUTE ONLY): 23 min   Charges:   PT Evaluation $Initial PT Evaluation Tier I: 1 Procedure PT Treatments $Therapeutic Activity: 8-22 mins   PT G Codes:        Cassell Clement, PT, CSCS Pager [phone] Office [phone]  01/30/2015, 11:30 AM

## 2015-01-30 NOTE — Evaluation (Signed)
Occupational Therapy Evaluation Patient Details Name: Jonathan [name] MRN: [id number] DOB: Mar 01, 1961 Today's Date: 01/30/2015    History of Present Illness LEFT TOTAL HIP ARTHROPLASTY ANTERIOR APPROACH (Left)   Clinical Impression   Pt reports that he received assist from a [facility] aide for bathing and dressing PTA, otherwise he was independent with ADLs. Pt in a lot of pain this morning. Mod assist for bed mobility. Unable to come from sit <> stand with max assist, attempted x 2 from EOB. Recommending SNF with 24 hour supervision for continued rehab to progress to safe level of independence with functional transfers prior to returning home. Pt would benefit from skilled OT to improve functional mobility including toilet transfers.     Follow Up Recommendations  SNF;Supervision/Assistance - 24 hour    Equipment Recommendations  Other (comment) (TBD/defer to next venue )    Recommendations for Other Services PT consult     Precautions / Restrictions Precautions Precautions: Fall Restrictions Weight Bearing Restrictions: Yes LLE Weight Bearing: Weight bearing as tolerated      Mobility Bed Mobility Overal bed mobility: Needs Assistance Bed Mobility: Supine to Sit;Sit to Supine     Supine to sit: Mod assist;HOB elevated (increased time required) Sit to supine: Mod assist;+2 for physical assistance (Use of bed pad to scoot hips)   General bed mobility comments: Pt able to roll side to side with mod A to assist with bedding change  Transfers Overall transfer level: Needs assistance Equipment used: Rolling walker (2 wheeled) Transfers: Sit to/from Stand           General transfer comment: Pt unable to perform sit <> stand transfer from elevated surface. Attempted x 2    Balance                                            ADL Overall ADL's : Needs assistance/impaired                                       General ADL Comments: No  family present during OT eval. Pt currently max-total assit for all ADLs. Pt with decreased ability to perform transfers due to pain and weakness, pt reports PTA he was able to ambulate to the toilet without any assistance     Vision     Perception     Praxis      Pertinent Vitals/Pain Pain Assessment: 0-10 Pain Score: 9  Pain Location: L hip Pain Descriptors / Indicators: Aching Pain Intervention(s): Limited activity within patient's tolerance;Monitored during session;Patient requesting pain meds-RN notified;RN gave pain meds during session;Ice applied;Repositioned     Hand Dominance     Extremity/Trunk Assessment Upper Extremity Assessment Upper Extremity Assessment: RUE deficits/detail;LUE deficits/detail RUE Deficits / Details: Limited ROM and strength due to RA, pt reports this was present PTA LUE Deficits / Details: Limited ROM and strength due to RA, pt reports this was present PTA   Lower Extremity Assessment Lower Extremity Assessment: Defer to PT evaluation       Communication Communication Communication: No difficulties   Cognition Arousal/Alertness: Awake/alert Behavior During Therapy: WFL for tasks assessed/performed Overall Cognitive Status: Within Functional Limits for tasks assessed                     General  Comments       Exercises       Shoulder Instructions      Home Living Family/patient expects to be discharged to:: Private residence Living Arrangements: Other relatives (cousin) Available Help at Discharge: Family;Home health (Cousin 24/7, [facility] aide 4 hours per day) Type of Home: House Home Access: Level entry     Home Layout: One level     Bathroom Shower/Tub: Teacher, early years/pre: Standard Bathroom Accessibility: Yes How Accessible: Accessible via walker Home Equipment: [place] - 2 wheels;Shower seat;Toilet riser          Prior Functioning/Environment Level of Independence: Needs assistance    ADL's /  Homemaking Assistance Needed: Pt reports that he has a [place] aide that assists with bathing and dressing PTA        OT Diagnosis: Generalized weakness;Acute pain   OT Problem List: Decreased strength;Decreased activity tolerance;Impaired balance (sitting and/or standing);Decreased safety awareness;Decreased knowledge of use of DME or AE;Decreased knowledge of precautions;Pain   OT Treatment/Interventions: Self-care/ADL training;DME and/or AE instruction;Patient/family education    OT Goals(Current goals can be found in the care plan section) Acute Rehab OT Goals Patient Stated Goal: to not be in so much pain OT Goal Formulation: With patient Time For Goal Achievement: 02/13/15 Potential to Achieve Goals: Good ADL Goals Pt Will Transfer to Toilet: ambulating;bedside commode;with min assist Pt Will Perform Toileting - Clothing Manipulation and hygiene: sit to/from stand;with min assist Pt Will Perform Tub/Shower Transfer: rolling walker;shower seat;ambulating;with min assist  OT Frequency: Min 2X/week   Barriers to D/C:            Co-evaluation              End of Session Equipment Utilized During Treatment: Gait belt;Rolling walker Nurse Communication: Patient requests pain meds;Mobility status;Other (comment) (200 cc from urinal )  Activity Tolerance: Patient limited by pain Patient left: in bed;with call bell/phone within reach   Time: 0840-0922 OT Time Calculation (min): 42 min Charges:  OT General Charges $OT Visit: 1 Procedure OT Evaluation $Initial OT Evaluation Tier I: 1 Procedure OT Treatments $Self Care/Home Management : 23-37 mins G-Codes:     [name] M.S., OTR/L Pager: [phone]  01/30/2015, 9:46 AM

## 2015-01-30 NOTE — Discharge Instructions (Signed)

## 2015-01-30 NOTE — Clinical Social Work Note (Signed)
Clinical Social Work Assessment  Patient Details  Name: Destan Franchini MRN: [id number] Date of Birth: [birth year]  Date of referral:  01/30/15               Reason for consult:  Facility Placement, Discharge Planning                Permission sought to share information with:  Facility Sport and exercise psychologist, Family Supports Permission granted to share information::  Yes, Verbal Permission Granted  Name::     Cristi Loron  Agency::  [facility]  Relationship::  [place] Offer Information:  [phone]  Housing/Transportation Living arrangements for the past 2 months:  [place] of Information:  Patient Patient Interpreter Needed:  None Criminal Activity/Legal Involvement Pertinent to Current Situation/Hospitalization:  No - Comment as needed Significant Relationships:  Other Family Members (Cousin) Lives with:  Other (Comment) (Cousin) Do you feel safe going back to the place where you live?  Yes Need for family participation in patient care:  Yes (Comment) (Patient requesting patient's cousin updated regarding discharge.)  Care giving concerns:  Patient expressed no concerns regarding discharge.   Social Worker assessment / plan:  CSW received referral for possible SNF placement at time of discharge. CSW met with patient at bedside to discuss discharge planning needs. Per patient, patient has previously completed short-term rehabilitation at [facility], but was unable to recall which location. Patient agreeable to PT recommendation for SNF placement when medically stable at discharge. CSW to continue to follow and assist with discharge planning needs.  Employment status:  Disabled (Comment on whether or not currently receiving Disability) Insurance information:  Medicare, Medicaid In [place] (Medicare primary.) PT Recommendations:  [place] / Referral to community resources:  [place]  Patient/Family's Response to care:  Patient understanding and agreeable to CSW plan of care.  Patient/Family's Understanding of and Emotional Response to Diagnosis, Current Treatment, and Prognosis:  Patient understanding and agreeable to CSW plan of care.  Emotional Assessment Appearance:  Appears stated age, Appears older than stated age Attitude/Demeanor/Rapport:  Other (Pleasant.) Affect (typically observed):  Accepting, Appropriate, Pleasant Orientation:  Oriented to Self, Oriented to Place, Oriented to  Time, Oriented to Situation Alcohol / Substance use:  Not Applicable Psych involvement (Current and /or in the community):  No (Comment) (Not appropriate on this admission.)  Discharge Needs  Concerns to be addressed:  No discharge needs identified Readmission within the last 30 days:  No Current discharge risk:  None Barriers to Discharge:  No Barriers Identified   Caroline Sauger, LCSW 01/30/2015, 4:01 PM [phone]

## 2015-01-30 NOTE — Op Note (Signed)
NAME:  Jonathan [name], Jonathan [name] NO.:  [id number]  MEDICAL RECORD NO.:  [id number]  LOCATION:  [id number]                        FACILITY:  [facility]  PHYSICIAN:  Lind Guest. Ninfa Linden, M.D.DATE OF BIRTH:  04-25-1961  DATE OF PROCEDURE:  01/29/2015 DATE OF DISCHARGE:                              OPERATIVE REPORT   PREOPERATIVE DIAGNOSIS:  Rheumatoid arthritis with protrusio left hip.  POSTOPERATIVE DIAGNOSIS:  Rheumatoid arthritis with protrusio left hip.  PROCEDURE:  Left total hip arthroplasty through direct anterior approach.  IMPLANTS:  DePuy Sector Gription acetabular component size 52 with a single screw, size 36 +0, neutral polyethylene liner, size 12 Corail femoral component with standard offset, size 36 +5 ceramic hip ball.  SURGEON:  Lind Guest. Ninfa Linden, M.D.  ASSISTANT:  Erskine Emery, PA-C.  ANESTHESIA:  Spinal.  BLOOD LOSS:  400 mL.  ANTIBIOTICS:  2 g IV Ancef.  COMPLICATIONS:  None.  INDICATIONS:  Jonathan [name] is a 54 year old gentleman well known to me.  He has profound rheumatoid disease and has had multiple joint replacements in the past including a right total hip arthroplasty by me already 2-3 months ago.  He has recovered from that well.  His left hip has debilitating pain, in fact it has negatively affected his activities of daily living, his mobility, and his quality of life.  He has x-rays that show severe protrusio of that hip and degenerative joint disease, and he wished to proceed with a total hip arthroplasty given the success on his right side.  He has failed all forms of conservative treatment.  He ambulates with assistive device.  His pain is daily.  He understands the risks of acute blood loss anemia, nerve and vessel injury, fracture, infection, dislocation, DVT.  He understands our goals are decreased pain, improved mobility, and overall improved quality of life.  PROCEDURE DESCRIPTION:  After informed consent was obtained,  appropriate left hip was marked.  He was brought to the operating room and spinal anesthesia was obtained while he was on the stretcher.  Traction boots were placed on both his feet.  Next, he was placed supine on the Hana fracture table with perineal post in place and both legs in __________ no traction applied.  Both knees have flexion contractures and both legs tend to externally rotate.  We were able to assess his left hip under direct fluoroscopy first and then we prepped the left hip with DuraPrep and sterile drapes __________ around the C-arm unit.  Time-out was called to identify correct patient, correct left hip.  We then made an incision inferior and posterior to the anterior superior iliac spine __________ obliquely down the leg.  We dissected down the tensor fascia lata muscle and tensor fascia was then divided longitudinally __________ through the direct anterior approach to the hip.  We identified and cauterized the lateral femoral circumflex vessels.  I then identified the hip capsule.  I opened up the hip capsule, finding a severe disease of the hip.  We made our femoral neck cut proximal to the lesser trochanter and completed this on osteotome.  We actually had to piecemeal out the femoral head with an osteotome and our reamer due to its severe disease.  We  then began reaming with our reamers under direct fluoroscopy and direct visualization in going from 42 up to a size 52. With the size 52 in place, we were pleased with anteversion and inclination, so we placed a real DePuy Sector Gription acetabular component size 52, and a single screw.  We then placed a 36 +0 liner for that size acetabular component.  Attention was then turned to the femur with __________ externally rotated to 100 degrees  __________ to place a Mueller retractor medially and a Hohmann retractor about the greater trochanter.  I released the lateral joint capsule and then used a box cutting osteotome  to enter the femoral canal and a rongeur to lateralize.  We then began broaching from a size 8 broach using the Corail broaching system from DePuy up to a size 12.  With a size 12, we trialed a standard neck and a 36 +1 __________ hip ball and reduced the acetabulum and it was stable with the way he lays externally rotated.  I felt we should at least go __________ with a hip ball.  We dislocated the hip, removed the trial components.  I replaced the real Corail femoral component with standard offset, size 12 and a real 36 +5 ceramic hip ball and reduced this in the acetabulum and it was nice and tight. We then copiously irrigated the soft tissues with normal saline solution using pulsatile lavage.  __________ we closed the tensor fascia with #1 Vicryl, followed by 0 Vicryl in the deep tissue, 2-0 Vicryl to subcutaneous tissue, running 4-0 Monocryl subcuticular stitch, and Steri- Strips on the skin.  He was then taken off the Hana table and taken to the recovery room in stable condition.  All final counts were correct. There were no complications noted.  Of note, Erskine Emery, PA-C, his assistance was essential for facilitating all aspects of this case.     Lind Guest. Ninfa Linden, M.D.     CYB/MEDQ  D:  01/29/2015  T:  01/29/2015  Job:  [id number]

## 2015-01-30 NOTE — Progress Notes (Signed)
Subjective: 1 Day Post-Op Procedure(s) (LRB): LEFT TOTAL HIP ARTHROPLASTY ANTERIOR APPROACH (Left) Patient reports pain as moderate.  Hgb down to 8.6 - acute blood loss anemia from surgery; vitals stable thus far.  Objective: Vital signs in last 24 hours: Temp:  [97.7 F (36.5 C)-98.6 F (37 C)] 98.6 F (37 C) (10/07 0610) Pulse Rate:  [57-100] 100 (10/07 0610) Resp:  [16-20] 18 (10/07 0610) BP: (98-137)/(67-79) 114/67 mmHg (10/07 0610) SpO2:  [100 %] 100 % (10/07 0610) Weight:  [69.854 kg (154 lb)] 69.854 kg (154 lb) (10/06 0732)  Intake/Output from previous day: 10/06 0701 - 10/07 0700 In: 1001.3 [I.V.:1001.3] Out: 3000 [Urine:2650; Blood:350] Intake/Output this shift: Total I/O In: -  Out: 2200 [Urine:2200]   Recent Labs  01/30/15 0434  HGB 8.6*    Recent Labs  01/30/15 0434  WBC 5.9  RBC 3.82*  HCT 27.3*  PLT 230    Recent Labs  01/30/15 0434  NA 135  K 3.4*  CL 99*  CO2 24  BUN <5*  CREATININE 0.53*  GLUCOSE 127*  CALCIUM 9.0   No results for input(s): LABPT, INR in the last 72 hours.  Sensation intact distally Intact pulses distally Dorsiflexion/Plantar flexion intact Incision: dressing C/D/I  Assessment/Plan: 1 Day Post-Op Procedure(s) (LRB): LEFT TOTAL HIP ARTHROPLASTY ANTERIOR APPROACH (Left) Up with therapy Discharge home with home health Monday. Follow H/H and transfuse if goes below 8.0 and symptomatic.  BLACKMAN,CHRISTOPHER Y 01/30/2015, 6:51 AM

## 2015-01-31 LAB — CBC
HEMATOCRIT: 29.5 % — AB (ref 39.0–52.0)
HEMOGLOBIN: 9.5 g/dL — AB (ref 13.0–17.0)
MCH: 23 pg — ABNORMAL LOW (ref 26.0–34.0)
MCHC: 32.2 g/dL (ref 30.0–36.0)
MCV: 71.4 fL — AB (ref 78.0–100.0)
Platelets: 242 10*3/uL (ref 150–400)
RBC: 4.13 MIL/uL — ABNORMAL LOW (ref 4.22–5.81)
RDW: 16.3 % — AB (ref 11.5–15.5)
WBC: 7.9 10*3/uL (ref 4.0–10.5)

## 2015-01-31 NOTE — Progress Notes (Signed)
Subjective: 2 Days Post-Op Procedure(s) (LRB): LEFT TOTAL HIP ARTHROPLASTY ANTERIOR APPROACH (Left) Patient reports pain as moderate.    Objective: Vital signs in last 24 hours: Temp:  [99.5 F (37.5 C)-101.3 F (38.5 C)] 99.5 F (37.5 C) (10/08 0902) Pulse Rate:  [97-105] 100 (10/08 0902) Resp:  [16-18] 17 (10/08 0902) BP: (108-155)/(57-76) 108/57 mmHg (10/08 0902) SpO2:  [99 %-100 %] 100 % (10/08 0902)  Intake/Output from previous day: 10/07 0701 - 10/08 0700 In: 600 [P.O.:600] Out: 1400 [Urine:1400] Intake/Output this shift:     Recent Labs  01/30/15 0434 01/31/15 0428  HGB 8.6* 9.5*    Recent Labs  01/30/15 0434 01/31/15 0428  WBC 5.9 7.9  RBC 3.82* 4.13*  HCT 27.3* 29.5*  PLT 230 242    Recent Labs  01/30/15 0434  NA 135  K 3.4*  CL 99*  CO2 24  BUN <5*  CREATININE 0.53*  GLUCOSE 127*  CALCIUM 9.0   No results for input(s): LABPT, INR in the last 72 hours.  Neurologically intact  Assessment/Plan: 2 Days Post-Op Procedure(s) (LRB): LEFT TOTAL HIP ARTHROPLASTY ANTERIOR APPROACH (Left) Up with therapy  Has not done steps yet , home Sunday or Monday  D/c iv  Jaquaya Coyle C 01/31/2015, 9:55 AM

## 2015-01-31 NOTE — Progress Notes (Signed)
Physical Therapy Treatment Patient Details Name: Jonathan [name] MRN: [id number] DOB: 1960/11/01 Today's Date: 01/31/2015    History of Present Illness LEFT TOTAL HIP ARTHROPLASTY ANTERIOR APPROACH (Left), history of chronic back pain, rhumatoid arthritis.    PT Comments    Patient seen for ambulation which was performed with +2 max assistance for a total of 5 feet. Based upon the patient's current mobility level, anticipate SNF at D/C. Patient reporting that he thinks that might be good to do before going home.   Follow Up Recommendations  SNF;Supervision/Assistance - 24 hour     Equipment Recommendations  None recommended by PT    Recommendations for Other Services       Precautions / Restrictions Precautions Precautions: Fall Restrictions Weight Bearing Restrictions: Yes LLE Weight Bearing: Weight bearing as tolerated    Mobility  Bed Mobility               General bed mobility comments: up in chair upon arrival  Transfers Overall transfer level: Needs assistance Equipment used: Rolling walker (2 wheeled) Transfers: Sit to/from Stand Sit to Stand: Max assist;+2 physical assistance (from chair)         General transfer comment: cues for hand placement and manual assistance for LE placement  Ambulation/Gait Ambulation/Gait assistance: +2 physical assistance;Max assist Ambulation Distance (Feet): 5 Feet Assistive device: Rolling walker (2 wheeled) Gait Pattern/deviations: Step-to pattern;Decreased weight shift to left Gait velocity: decreased   General Gait Details: max assist needed to unweight body and lift Rt LE, patient with minimal weightbearing through LLE and UEs.    Stairs            Wheelchair Mobility    Modified Rankin (Stroke Patients Only)       Balance Overall balance assessment: Needs assistance Sitting-balance support: Single extremity supported Sitting balance-Leahy Scale: Poor     Standing balance support: Bilateral  upper extremity supported Standing balance-Leahy Scale: Poor Standing balance comment: needing rw and physical assistance                    Cognition Arousal/Alertness: Awake/alert Behavior During Therapy: WFL for tasks assessed/performed Overall Cognitive Status: Within Functional Limits for tasks assessed                      Exercises      General Comments        Pertinent Vitals/Pain Pain Assessment: 0-10 Pain Score: 8  Pain Location: Lt hip Pain Descriptors / Indicators: Sore Pain Intervention(s): Limited activity within patient's tolerance;Monitored during session;Ice applied    Home Living                      Prior Function            PT Goals (current goals can now be found in the care plan section) Acute Rehab PT Goals Patient Stated Goal: be able to go home PT Goal Formulation: With patient Time For Goal Achievement: 02/13/15 Potential to Achieve Goals: Fair Progress towards PT goals: Progressing toward goals    Frequency  7X/week    PT Plan Current plan remains appropriate    Co-evaluation             End of Session Equipment Utilized During Treatment: Gait belt Activity Tolerance: Patient limited by pain Patient left: in chair;with call bell/phone within reach     Time: 1024-1035 PT Time Calculation (min) (ACUTE ONLY): 11 min  Charges:  $Gait Training: 8-22  mins                    G Codes:      Cassell Clement, PT, CSCS Pager [phone] Office [phone]  01/31/2015, 11:22 AM

## 2015-02-01 NOTE — Care Management Note (Signed)
Case Management Note  Patient Details  Name: Federick Levene MRN: [id number] Date of Birth: Nov 16, 1960  Subjective/Objective:                  S/p left total hip arthroplasty  Action/Plan: Pt previously setup with [facility] for Home services but discharge plan now per SW note is for pt to discharge to [facility]. Cm called Tommi Emery with [facility] @ [phone] to notify of change in discharge plan. CM remains available for further discharge planning needs.    Expected Discharge Date:                  Expected Discharge Plan:  Skilled Nursing Facility  In-House Referral:  Clinical Social Work  Discharge planning Services  CM Consult  Post Acute Care Choice:    Choice offered to:     DME Arranged:    DME Agency:     HH Arranged:    [place] Agency:     Status of Service:  In process, will continue to follow  Medicare Important Message Given:    Date Medicare IM Given:    Medicare IM give by:    Date Additional Medicare IM Given:    Additional Medicare Important Message give by:     If discussed at [place] of Stay Meetings, dates discussed:    Additional Comments:  Guido Sander, RN 02/01/2015, 9:44 AM

## 2015-02-01 NOTE — Progress Notes (Signed)
Physical Therapy Treatment Patient Details Name: Jonathan [name] MRN: [id number] DOB: 1960/08/04 Today's Date: 02/01/2015    History of Present Illness Patient is a 54 y/o male s/p LEFT TOTAL HIP ARTHROPLASTY ANTERIOR APPROACH. PMH of chronic back pain, rhumatoid arthritis.    PT Comments    Patient progressing very slowly with mobility. Requires Max A of 2 for transfers. Only able to take a few steps with assist of 2. Pt very stiff and sore this afternoon due to sleeping in chair all night and not moving much. Educated re: performing exercises, changing positions, importance of mobility. Pt not safe to return home. Continue to recommend ST SNF to maximize independence and mobility. Will follow acutely.   Follow Up Recommendations  SNF;Supervision/Assistance - 24 hour     Equipment Recommendations  None recommended by PT    Recommendations for Other Services OT consult     Precautions / Restrictions Precautions Precautions: Fall Restrictions Weight Bearing Restrictions: Yes LLE Weight Bearing: Weight bearing as tolerated    Mobility  Bed Mobility Overal bed mobility: Needs Assistance Bed Mobility: Sit to Supine       Sit to supine: +2 for physical assistance;Max assist   General bed mobility comments: Assist with bringing BLEs into bed and using bed pad to rotate hips to return to supine.   Transfers Overall transfer level: Needs assistance Equipment used: Rolling walker (2 wheeled) Transfers: Sit to/from Stand Sit to Stand: Max assist;Total assist;+2 physical assistance         General transfer comment: Cues for hand placement and manual cues for LE placement. Max-total A of 2 to stand with cues for anterior translation.  Ambulation/Gait Ambulation/Gait assistance: Max assist;+2 physical assistance Ambulation Distance (Feet): 3 Feet Assistive device: Rolling walker (2 wheeled) Gait Pattern/deviations: Step-to pattern;Decreased weight shift to left;Decreased  stance time - left;Trunk flexed Gait velocity: decreased   General Gait Details: Max A for balance, for weight shifting to left and to advance RLE; reluctant to place weight through LLE and UEs.   Stairs            Wheelchair Mobility    Modified Rankin (Stroke Patients Only)       Balance Overall balance assessment: Needs assistance Sitting-balance support: Feet supported;Bilateral upper extremity supported Sitting balance-Leahy Scale: Poor Sitting balance - Comments: Difficulty sitting upright in chair without back support without assist.  Postural control: Posterior lean Standing balance support: During functional activity Standing balance-Leahy Scale: Poor Standing balance comment: Relient on RW and external support for balance.                     Cognition Arousal/Alertness: Awake/alert Behavior During Therapy: WFL for tasks assessed/performed Overall Cognitive Status: Within Functional Limits for tasks assessed                      Exercises Total Joint Exercises Ankle Circles/Pumps: Both;10 reps;Supine Quad Sets: Both;10 reps;Supine Gluteal Sets: Both;10 reps;Supine Heel Slides: Left;10 reps;Supine Hip ABduction/ADduction: Left;10 reps;AAROM;Supine Long Arc Quad: Left;10 reps;Seated    General Comments General comments (skin integrity, edema, etc.): Pt reports sleeping in recliner last night and being in chair since therapy session yesterday >24 hours ago without any mobility. Bed pad found saturated in urine and wet. IV hanging out. Charge nurse notified.       Pertinent Vitals/Pain Pain Assessment: Faces Faces Pain Scale: Hurts whole lot Pain Location: left hip Pain Descriptors / Indicators: Sore;Aching;Guarding;Grimacing Pain Intervention(s): Monitored during session;Repositioned;Limited activity  within patient's tolerance    Home Living                      Prior Function            PT Goals (current goals can now be  found in the care plan section) Progress towards PT goals: Progressing toward goals (slowly)    Frequency  7X/week    PT Plan Current plan remains appropriate    Co-evaluation             End of Session Equipment Utilized During Treatment: Gait belt Activity Tolerance: Patient tolerated treatment well;Patient limited by pain Patient left: in bed;with call bell/phone within reach;with bed alarm set;with SCD's reapplied     Time: 1341-1411 PT Time Calculation (min) (ACUTE ONLY): 30 min  Charges:  $Therapeutic Exercise: 8-22 mins $Therapeutic Activity: 8-22 mins                    G Codes:      Jonathan [name] A Jonathan [name] 02/01/2015, 2:36 PM Jonathan [name], Jonathan [name], DPT [phone]

## 2015-02-01 NOTE — Progress Notes (Signed)
Subjective: 3 Days Post-Op Procedure(s) (LRB): LEFT TOTAL HIP ARTHROPLASTY ANTERIOR APPROACH (Left) Patient reports pain as moderate.     " therapy says I will need to go to rehab, I was in [place] for the right hip " Objective: Vital signs in last 24 hours: Temp:  [98.3 F (36.8 C)-100.3 F (37.9 C)] 99 F (37.2 C) (10/09 0548) Pulse Rate:  [98-106] 106 (10/09 0548) Resp:  [16-17] 16 (10/09 0548) BP: (101-111)/(57-67) 101/67 mmHg (10/09 0548) SpO2:  [98 %-100 %] 99 % (10/09 0548)  Intake/Output from previous day: 10/08 0701 - 10/09 0700 In: 825 [I.V.:825] Out: 600 [Urine:600] Intake/Output this shift:     Recent Labs  01/30/15 0434 01/31/15 0428  HGB 8.6* 9.5*    Recent Labs  01/30/15 0434 01/31/15 0428  WBC 5.9 7.9  RBC 3.82* 4.13*  HCT 27.3* 29.5*  PLT 230 242    Recent Labs  01/30/15 0434  NA 135  K 3.4*  CL 99*  CO2 24  BUN <5*  CREATININE 0.53*  GLUCOSE 127*  CALCIUM 9.0   No results for input(s): LABPT, INR in the last 72 hours.  Neurologically intact  Assessment/Plan: 3 Days Post-Op Procedure(s) (LRB): LEFT TOTAL HIP ARTHROPLASTY ANTERIOR APPROACH (Left) Up with therapy Discharge to SNF  Jamayah Myszka C 02/01/2015, 7:38 AM

## 2015-02-02 MED ORDER — METHOCARBAMOL 500 MG PO TABS: 500.0000 mg | ORAL_TABLET | Freq: Four times a day (QID) | ORAL | Status: DC | PRN

## 2015-02-02 MED ORDER — ASPIRIN 325 MG PO TBEC: 325.0000 mg | DELAYED_RELEASE_TABLET | Freq: Two times a day (BID) | ORAL | Status: DC

## 2015-02-02 MED ORDER — OXYCODONE-ACETAMINOPHEN 5-325 MG PO TABS: 1.0000 | ORAL_TABLET | ORAL | Status: DC | PRN

## 2015-02-02 NOTE — Clinical Social Work Note (Signed)
Patient currently refusing SNF placement at this time. Patient requesting to discharge home. Per RN, patient's family aware and agreeable to patient discharging home at time of discharge. CSW signing off.  Lubertha Sayres, [place] Clinical Social Work Department Orthopedics [phone]) and Surgical [phone])

## 2015-02-02 NOTE — Progress Notes (Addendum)
Notified by CSW that patient is refusing to go to SNF. Spoke with PT, confirmed that recommendation is SNF. Spoke with patient, confirmed with him that he is choosing to go home instead of to a SNF. I explained that PT is recommending SNF because he needs 2 people to assist and he is at high risk to fall. I asked who would be helping him at home. He stated that he lives with his cousin Lin Givens, [phone], and that he has an aide that comes daily for a few hours in the morning and a few hours in the evening. He gave me permission to call his cousin and discuss discharge plan with her. Contacted Ms Brigitte Pulse and explain the PT recommendation being SNF and that he is requiring two people to assist him. I explained difference in SNF vs HHPT. She confirmed that he has an aide daily and stated that she thinks that she can handle him coming directly home because she has experience helping her mother after hip surgery. She confirmed that patient has a rolling walker but not a 3N1. Spoke with patient and relayed what his cousin has said. He confirmed again that he wants to go home. Explained that his nurse will be contacting Dr. Ninfa Linden to let me know of change in plan and that I would contact [facility] to bring him a 3N1 to take home.Updated patient's RN. Contacted Clydean Posas with [facility] and confirmed that patient is set up for HHPT. Will continue to follow.   Contacted James with [facility] and requested 3N1 be delivered to patient's room.

## 2015-02-02 NOTE — Discharge Summary (Signed)
Patient ID: Jonathan [name] MRN: [id number] DOB/AGE: 1961/02/07 54 y.o.  Admit date: 01/29/2015 Discharge date: 02/02/2015  Admission Diagnoses:  Principal Problem:   Rheumatoid arthritis involving left hip [facility]) Active Problems:   Status post total replacement of left hip   Discharge Diagnoses:  Same  Past Medical History  Diagnosis Date  . Arthritis     rheumatoid  . Rheumatoid arthritis(714.0)   . Anemia     H/o using iron in the past   . Joint pain   . Joint swelling   . Chronic back pain     Surgeries: Procedure(s): LEFT TOTAL HIP ARTHROPLASTY ANTERIOR APPROACH on 01/29/2015   Consultants:    Discharged Condition: Improved  Hospital Course: Ardian Haberland is an 54 y.o. male who was admitted 01/29/2015 for operative treatment ofRheumatoid arthritis involving left hip ([place]). Patient has severe unremitting pain that affects sleep, daily activities, and work/hobbies. After pre-op clearance the patient was taken to the operating room on 01/29/2015 and underwent  Procedure(s): LEFT TOTAL HIP ARTHROPLASTY ANTERIOR APPROACH.    Patient was given perioperative antibiotics: Anti-infectives    Start     Dose/Rate Route Frequency Ordered Stop   01/29/15 1730  ceFAZolin (ANCEF) IVPB 1 g/50 mL premix     1 g 100 mL/hr over 30 Minutes Intravenous Every 6 hours 01/29/15 1642 01/30/15 0100   01/29/15 0900  ceFAZolin (ANCEF) IVPB 2 g/50 mL premix     2 g 100 mL/hr over 30 Minutes Intravenous To ShortStay Surgical 01/28/15 1359 01/29/15 1020       Patient was given sequential compression devices, early ambulation, and chemoprophylaxis to prevent DVT.  Patient benefited maximally from hospital stay and there were no complications.    Recent vital signs: Patient Vitals for the past 24 hrs:  BP Temp Temp src Pulse Resp SpO2  02/02/15 0453 103/67 mmHg 99.1 F (37.3 C) Oral 94 17 99 %  02/01/15 2144 113/60 mmHg 99 F (37.2 C) Oral 99 16 98 %  02/01/15 1350 107/71 mmHg 98.8  F (37.1 C) Oral 100 17 100 %     Recent laboratory studies:  Recent Labs  01/31/15 0428  WBC 7.9  HGB 9.5*  HCT 29.5*  PLT 242     Discharge Medications:     Medication List    TAKE these medications        aspirin 325 MG EC tablet  Take 1 tablet (325 mg total) by mouth 2 (two) times daily after a meal.     methocarbamol 500 MG tablet  Commonly known as:  ROBAXIN  Take 1 tablet (500 mg total) by mouth every 6 (six) hours as needed for muscle spasms.     oxyCODONE-acetaminophen 5-325 MG tablet  Commonly known as:  ROXICET  Take 1-2 tablets by mouth every 4 (four) hours as needed.        Diagnostic Studies: Dg Hip Port Unilat With Pelvis 1v Left  01/29/2015   CLINICAL DATA:  Status post total left hip replacement.  EXAM: DG HIP (WITH OR WITHOUT PELVIS) 1V PORT LEFT  COMPARISON:  None.  FINDINGS: There bilateral total hip replacements without malalignment.  IMPRESSION: Bilateral total hip replacements without malalignment.   Electronically Signed   By: Abelardo Diesel M.D.   On: 01/29/2015 13:39   Dg Hip Operative Unilat With Pelvis Left  01/29/2015   CLINICAL DATA:  Post LEFT hip arthroplasty  EXAM: OPERATIVE LEFT HIP (WITH PELVIS IF PERFORMED) 2 VIEWS  TECHNIQUE: Fluoroscopic spot image(s)  were submitted for interpretation post-operatively.  COMPARISON:  Pelvic radiograph 11/11/2014  FLUOROSCOPY TIME:  0 minutes 33 seconds  Images obtained: 2  FINDINGS: BILATERAL hip prostheses are now identified, new on LEFT.  Bones appear demineralized.  No fracture or dislocation seen.  IMPRESSION: BILATERAL hip prostheses without identified fracture or dislocation.   Electronically Signed   By: Lavonia Dana M.D.   On: 01/29/2015 12:26    Disposition:  To home      Discharge Instructions    Discharge patient    Complete by:  As directed            Follow-up Information    Follow up with Mcarthur Rossetti, MD In 2 weeks.   Specialty:  Orthopedic Surgery   Contact  information:   [place] [address] [phone]        Signed: Mcarthur Rossetti 02/02/2015, 7:11 AM

## 2015-02-02 NOTE — Progress Notes (Signed)
OT Cancellation Note  Patient Details Name: Jonathan [name] MRN: [id number] DOB: 1961-01-25   Cancelled Treatment:    Reason Eval/Treat Not Completed: Patient declined, no reason specified. Pt declined to get up with therapy at this time with max encouragement. Pt reports he is sore but comfortable in the chair right now. Educated pt on use of 3 in 1 as bedside commode and over toilet, compensatory strategies for LB ADLs; pt verbalized understanding. Pt reports that he has no further questions or concerns for OT at this time. Will check back as time allows.    [name] M.S., OTR/L Pager: [phone]  02/02/2015, 3:23 PM

## 2015-02-02 NOTE — Progress Notes (Signed)
Patient ID: Jonathan [name], male   DOB: [birth year], 54 y.o.   MRN: [id number] Patient and his family want him going home instead of short-term SNF placement.  Stable overall for discharge today.

## 2015-02-02 NOTE — Care Management Important Message (Signed)
Important Message  Patient Details  Name: Jonathan [name] MRN: [id number] Date of Birth: 18-Aug-1960   Medicare Important Message Given:  Yes-second notification given    Delorse Lek 02/02/2015, 2:21 PM

## 2015-02-02 NOTE — Progress Notes (Signed)
Physical Therapy Treatment Patient Details Name: Jonathan [name] MRN: [id number] DOB: [birth year] Today's Date: 02/02/2015    History of Present Illness Patient is a 54 y/o male s/p LEFT TOTAL HIP ARTHROPLASTY ANTERIOR APPROACH. PMH of chronic back pain, rhumatoid arthritis.    PT Comments    Patient is s/p above surgery resulting in functional limitations due to the deficits listed below (see PT Problem List).  Patient will benefit from skilled PT to increase their independence and safety with mobility to allow discharge to the venue listed below. At this time, PT recommending SNF for D/C based upon the patient's current mobility level (max assist bed mobility, +2 assist for standing, max assist for ambulation). Patient reporting that he wants to go home despite the amount of assistance needed for his safety. Discussed recommendation with patient and he again states that he is going to go home. Discussed recommendations with CSW and case Freight forwarder.      Follow Up Recommendations  SNF;Supervision/Assistance - 24 hour     Equipment Recommendations  None recommended by PT    Recommendations for Other Services       Precautions / Restrictions Precautions Precautions: Fall Restrictions Weight Bearing Restrictions: Yes LLE Weight Bearing: Weight bearing as tolerated    Mobility  Bed Mobility Overal bed mobility: Needs Assistance Bed Mobility: Supine to Sit     Supine to sit: Mod assist;HOB elevated (with use of rail)     General bed mobility comments: assist with trunk and LLE to get to edge of bed  Transfers Overall transfer level: Needs assistance Equipment used: Rolling walker (2 wheeled) Transfers: Sit to/from Stand Sit to Stand: Max assist;+2 physical assistance         General transfer comment: patient unable to assist with UEs to get to standing, manual assistace for Rt LE placement.   Ambulation/Gait Ambulation/Gait assistance: Max assist Ambulation Distance  (Feet): 6 Feet Assistive device: Rolling walker (2 wheeled) Gait Pattern/deviations: Step-to pattern;Decreased weight shift to left Gait velocity: decreased   General Gait Details: max assist for weightbearing on LLE to advance RLE.    Stairs            Wheelchair Mobility    Modified Rankin (Stroke Patients Only)       Balance Overall balance assessment: Needs assistance Sitting-balance support: Feet supported;Bilateral upper extremity supported Sitting balance-Leahy Scale: Poor   Postural control: Posterior lean Standing balance support: Bilateral upper extremity supported Standing balance-Leahy Scale: Poor Standing balance comment: using rw and physical assistance                    Cognition Arousal/Alertness: Awake/alert Behavior During Therapy: WFL for tasks assessed/performed Overall Cognitive Status: Within Functional Limits for tasks assessed                      Exercises Total Joint Exercises Ankle Circles/Pumps: Both;10 reps;Supine Quad Sets: Left;10 reps;Strengthening Gluteal Sets: Strengthening;Both;10 reps Heel Slides: AAROM;Left;10 reps Hip ABduction/ADduction: Strengthening;Left;10 reps Long Arc Quad: Strengthening;Left;10 reps    General Comments        Pertinent Vitals/Pain Pain Assessment: Faces Faces Pain Scale: Hurts even more Pain Location: Lt hip Pain Descriptors / Indicators: Sore Pain Intervention(s): Limited activity within patient's tolerance;Monitored during session    Home Living                      Prior Function            PT  Goals (current goals can now be found in the care plan section) Acute Rehab PT Goals Patient Stated Goal: go home PT Goal Formulation: With patient Time For Goal Achievement: 02/13/15 Potential to Achieve Goals: Fair Progress towards PT goals: Progressing toward goals    Frequency  7X/week    PT Plan Current plan remains appropriate    Co-evaluation              End of Session Equipment Utilized During Treatment: Gait belt Activity Tolerance: Patient limited by fatigue;Other (comment) (request sitting break with ambulation) Patient left: in chair;with call bell/phone within reach     Time: 0831-0854 PT Time Calculation (min) (ACUTE ONLY): 23 min  Charges:  $Gait Training: 8-22 mins $Therapeutic Exercise: 8-22 mins                    G Codes:      Cassell Clement, PT, CSCS Pager [phone] Office [phone]  02/02/2015, 1:06 PM

## 2015-02-02 NOTE — Clinical Social Work Placement (Signed)
   CLINICAL SOCIAL WORK PLACEMENT  NOTE  Date:  02/02/2015  Patient Details  Name: Jonathan [name] MRN: [id number] Date of Birth: 1961/04/25  Clinical Social Work is seeking post-discharge placement for this patient at the [place] level of care (*CSW will initial, date and re-position this form in  chart as items are completed):  Yes   Patient/family provided with [employer] Work Department's list of facilities offering this level of care within the geographic area requested by the patient (or if unable, by the patient's family).  Yes   Patient/family informed of their freedom to choose among providers that offer the needed level of care, that participate in Medicare, Medicaid or managed care program needed by the patient, have an available bed and are willing to accept the patient.  Yes   Patient/family informed of [facility]'s ownership interest in [facility] and [facility], as well as of the fact that they are under no obligation to receive care at these facilities.  PASRR submitted to EDS on  (n/a)     PASRR number received on  (n/a)     Existing PASRR number confirmed on 01/30/15     FL2 transmitted to all facilities in geographic area requested by pt/family on 01/30/15     FL2 transmitted to all facilities within larger geographic area on  (n/a)     Patient informed that his/her managed care company has contracts with or will negotiate with certain facilities, including the following:        Yes (Weekend CSW presented 10/8)   Patient/family informed of bed offers received.  Patient chooses bed at [facility] (Per weekend CSW report.)     Physician recommends and patient chooses bed at      Patient to be transferred to [facility] on 02/02/15.  Patient to be transferred to facility by [facility]     Patient family notified on 02/02/15 of transfer.  Name of family member notified:  Patient  notified at bedside.     PHYSICIAN       Additional Comment:    _______________________________________________ Caroline Sauger, LCSW 02/02/2015, 10:42 AM

## 2015-02-09 NOTE — Progress Notes (Signed)
Patient ID: Jonathan [name], male   DOB: [birth year], 54 y.o.   MRN: [id number]    Facility: [facility]      No Known Allergies  Chief Complaint  Patient presents with  . Hospitalization Follow-up    HPI:  He has been hospitalized for a right hip replacement. He is here for short term rehab with his goal to return back home. He does have muscle pain from his surgery; we will change his robaxin to routine dosing. there are no nursing concerns at this time.    Past Medical History  Diagnosis Date  . Arthritis     rheumatoid  . Rheumatoid arthritis(714.0)   . Anemia     H/o using iron in the past   . Joint pain   . Joint swelling   . Chronic back pain     Past Surgical History  Procedure Laterality Date  . Knee arthroplasty  06/07/2011    Procedure: COMPUTER ASSISTED TOTAL KNEE ARTHROPLASTY;  Surgeon: Mcarthur Rossetti, MD;  Location: [place];  Service: Orthopedics;  Laterality: Right;  Right total knee arthoplasty  . Knee closed reduction  07/19/2011    Procedure: CLOSED MANIPULATION KNEE;  Surgeon: Mcarthur Rossetti, MD;  Location: [place];  Service: Orthopedics;  Laterality: Right;  Manipulation under anesthesia right knee  . Joint replacement      L TKA  also right in 05/2011  . Esophagogastroduodenoscopy N/A 04/03/2013    Procedure: ESOPHAGOGASTRODUODENOSCOPY (EGD);  Surgeon: Irene Shipper, MD;  Location: [facility];  Service: Endoscopy;  Laterality: N/A;  changed to moderate dr. Henrene Pastor did not want to go to the o.r. for an endo colon-jmt  . Colonoscopy N/A 04/03/2013    Procedure: COLONOSCOPY;  Surgeon: Irene Shipper, MD;  Location: [facility];  Service: Endoscopy;  Laterality: N/A;  . Eye surgery Left     "when i was a very small boy"  . Total hip arthroplasty Right 11/11/2014    Procedure: RIGHT TOTAL HIP ARTHROPLASTY ANTERIOR APPROACH;  Surgeon: Mcarthur Rossetti, MD;  Location: [place];  Service: Orthopedics;  Laterality: Right;    VITAL  SIGNS BP 126/66 mmHg  Pulse 80  Ht 5\' 6"  (1.676 m)  Wt 136 lb (61.689 kg)  BMI 21.96 kg/m2  SpO2 96%  Patient's Medications  New Prescriptions   No medications on file  Previous Medications   ASPIRIN 325 MG EC TABLET    Take 1 tablet (325 mg total) by mouth 2 (two) times daily after a meal.   METHOCARBAMOL (ROBAXIN) 500 MG TABLET    Take 1 tablet (500 mg total) by mouth every 6 (six) hours as needed for muscle spasms.   OXYCODONE-ACETAMINOPHEN (ROXICET) 5-325 MG TABLET    Take 1-2 tablets by mouth every 4 (four) hours as needed.  Modified Medications   No medications on file  Discontinued Medications   No medications on file     SIGNIFICANT DIAGNOSTIC EXAMS  11-11-14: right hip x-ray: Total right hip replacement good anatomic alignment   LABS REVIEWED:   11-12-14: wbc 6.4; hgb 8.4; hct 26.8; mcv 70,3; plt 239; glucose 165; bun 6; creat 0.6; k+ 3.6; na++134    Review of Systems  Constitutional: Negative for appetite change and fatigue.  HENT: Negative for congestion.   Respiratory: Negative for cough, chest tightness and shortness of breath.   Cardiovascular: Negative for chest pain, palpitations and leg swelling.  Gastrointestinal: Negative for nausea, abdominal pain, diarrhea and constipation.  Musculoskeletal: Positive  for myalgias. Negative for arthralgias.       Has right hip pain   Skin: Negative for pallor.  Neurological: Negative for dizziness.  Psychiatric/Behavioral: The patient is not nervous/anxious.       Physical Exam  Constitutional: He is oriented to person, place, and time. No distress.  Eyes: Conjunctivae are normal.  Neck: Neck supple. No JVD present. No thyromegaly present.  Cardiovascular: Normal rate, regular rhythm and intact distal pulses.   Respiratory: Effort normal and breath sounds normal. No respiratory distress. He has no wheezes.  GI: Soft. Bowel sounds are normal. He exhibits no distension. There is no tenderness.  Musculoskeletal: He  exhibits no edema.  Able to move all extremities  Is status post right hip replacement   Lymphadenopathy:    He has no cervical adenopathy.  Neurological: He is alert and oriented to person, place, and time.  Skin: Skin is warm and dry. He is not diaphoretic.  Psychiatric: He has a normal mood and affect.       ASSESSMENT/ PLAN:  1. 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 Will continue therapy as directed and will follow up with orthopedics as indicated. Will continue percocet 5/325 mg 1 or 2 tabs every 4 hours as needed; will change the robaxin to 500 mg every 6 hours routinely and will monitor  2.  RA: is without change; is status post right hip replacement. Is on asa 325 mg daily   3. Physical deconditioning: will continue therapy as directed to improve his strength; gait; balance and adl independence    Time spent with patient 50  minutes >50% time spent counseling; reviewing medical record; tests; labs; and developing future plan of care   Ok Edwards NP [facility]  Contact [phone] Monday through Friday 8am- 5pm  After hours call [phone]

## 2015-02-23 ENCOUNTER — Encounter: Payer: Self-pay | Admitting: Adult Health

## 2015-02-23 NOTE — Progress Notes (Signed)
Patient ID: Jonathan [name], male   DOB: April 01, 1961, 54 y.o.   MRN: [id number]    Facility: [facility]      No Known Allergies  Chief Complaint  Patient presents with  . Acute Visit    right hip pain     HPI:  He is status post right hip replacement. He is here for short term rehab. His pain is not being adequately managed and this is interfering with his ability to participate in therapy. We discussed his pain management and will make his percocet routine at this time.    Past Medical History  Diagnosis Date  . Arthritis     rheumatoid  . Rheumatoid arthritis(714.0)   . Anemia     H/o using iron in the past   . Joint pain   . Joint swelling   . Chronic back pain     Past Surgical History  Procedure Laterality Date  . Knee arthroplasty  06/07/2011    Procedure: COMPUTER ASSISTED TOTAL KNEE ARTHROPLASTY;  Surgeon: Mcarthur Rossetti, MD;  Location: [place];  Service: Orthopedics;  Laterality: Right;  Right total knee arthoplasty  . Knee closed reduction  07/19/2011    Procedure: CLOSED MANIPULATION KNEE;  Surgeon: Mcarthur Rossetti, MD;  Location: [place];  Service: Orthopedics;  Laterality: Right;  Manipulation under anesthesia right knee  . Joint replacement      L TKA  also right in 05/2011  . Esophagogastroduodenoscopy N/A 04/03/2013    Procedure: ESOPHAGOGASTRODUODENOSCOPY (EGD);  Surgeon: Irene Shipper, MD;  Location: [facility];  Service: Endoscopy;  Laterality: N/A;  changed to moderate dr. Henrene Pastor did not want to go to the o.r. for an endo colon-jmt  . Colonoscopy N/A 04/03/2013    Procedure: COLONOSCOPY;  Surgeon: Irene Shipper, MD;  Location: [facility];  Service: Endoscopy;  Laterality: N/A;  . Eye surgery Left     "when i was a very small boy"  . Total hip arthroplasty Right 11/11/2014    Procedure: RIGHT TOTAL HIP ARTHROPLASTY ANTERIOR APPROACH;  Surgeon: Mcarthur Rossetti, MD;  Location: [place];  Service: Orthopedics;  Laterality:  Right;    VITAL SIGNS BP 120/70 mmHg  Pulse 76  Ht 5\' 6"  (1.676 m)  Wt 136 lb (61.689 kg)  BMI 21.96 kg/m2  SpO2 96%  Patient's Medications  New Prescriptions   No medications on file  Previous Medications   ASPIRIN 325 MG EC TABLET    Take 1 tablet (325 mg total) by mouth 2 (two) times daily after a meal.   METHOCARBAMOL (ROBAXIN) 500 MG TABLET    Take 1 tablet (500 mg total) by mouth every 6 (six) hours as needed for muscle spasms.   OXYCODONE-ACETAMINOPHEN (ROXICET) 5-325 MG TABLET    Take 1-2 tablets by mouth every 4 (four) hours as needed.  Modified Medications   No medications on file  Discontinued Medications   No medications on file     SIGNIFICANT DIAGNOSTIC EXAMS   11-11-14: right hip x-ray: Total right hip replacement good anatomic alignment   LABS REVIEWED:   11-12-14: wbc 6.4; hgb 8.4; hct 26.8; mcv 70,3; plt 239; glucose 165; bun 6; creat 0.6; k+ 3.6; na++134      Review of Systems Constitutional: Negative for appetite change and fatigue.  HENT: Negative for congestion.   Respiratory: Negative for cough, chest tightness and shortness of breath.   Cardiovascular: Negative for chest pain, palpitations and leg swelling.  Gastrointestinal: Negative for nausea,  abdominal pain, diarrhea and constipation.  Musculoskeletal: Positive for myalgias. Negative for arthralgias.       Has right hip pain   Skin: Negative for pallor.  Neurological: Negative for dizziness.  Psychiatric/Behavioral: The patient is not nervous/anxious.       Physical Exam Constitutional: He is oriented to person, place, and time. No distress.  Eyes: Conjunctivae are normal.  Neck: Neck supple. No JVD present. No thyromegaly present.  Cardiovascular: Normal rate, regular rhythm and intact distal pulses.   Respiratory: Effort normal and breath sounds normal. No respiratory distress. He has no wheezes.  GI: Soft. Bowel sounds are normal. He exhibits no distension. There is no  tenderness.  Musculoskeletal: He exhibits no edema.  Able to move all extremities  Is status post right hip replacement   Lymphadenopathy:    He has no cervical adenopathy.  Neurological: He is alert and oriented to person, place, and time.  Skin: Skin is warm and dry. He is not diaphoretic.  Psychiatric: He has a normal mood and affect.       ASSESSMENT/ PLAN:  1. 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 Will continue therapy as directed and will follow up with orthopedics as indicated. Will continue robaxin  500 mg every 6 hours routinely   Will change the percocet 5/325 mg every 6 hours routinely and twice daily as needed and will monitor    Ok Edwards NP [facility]  Contact [phone] Monday through Friday 8am- 5pm  After hours call [phone]

## 2015-05-17 ENCOUNTER — Encounter: Payer: Self-pay | Admitting: Internal Medicine

## 2015-05-17 NOTE — Progress Notes (Signed)
Patient ID: Jonathan [name], male   DOB: 04-14-1961, 55 y.o.   MRN: [id number]    HISTORY AND PHYSICAL   DATE: 11/18/14  Location:  [facility]    Place of Service: SNF [phone])   Extended Emergency Contact Information Primary Emergency Contact: [place] Phone: [phone] Relation: Relative  Advanced Directive information  FULL CODE  Chief Complaint  Patient presents with  . 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 He will need short term rehab. H/H 8.3/28.8 at d/c with Cr 0.6. He was started on ASA 355m daily for DVT prophylaxis.  He has no c/o today. No nursing issues. Appetite is excellent. No falls. He sleeps well.  Arthritis/RA - pain controlled on oxycodone and robaxin. Followed by rheum  Depression - mood stable without meds  Hyperlipidemia - diet controlled   Past Medical History  Diagnosis Date  . Arthritis     rheumatoid  . Rheumatoid arthritis(714.0)   . Anemia     H/o using iron in the past   . Joint pain   . Joint swelling   . Chronic back pain     Past Surgical History  Procedure Laterality Date  . Knee arthroplasty  06/07/2011    Procedure: COMPUTER ASSISTED TOTAL KNEE ARTHROPLASTY;  Surgeon: CMcarthur Rossetti MD;  [place] [facility]  Service: Orthopedics;  Laterality: Right;  Right total knee arthoplasty  . Knee closed reduction  07/19/2011    Procedure: CLOSED MANIPULATION KNEE;  Surgeon: CMcarthur Rossetti MD;  [facility] [facility]  Service: Orthopedics;  Laterality: Right;  Manipulation under anesthesia right knee  . Joint replacement      L TKA  also right in 05/2011  . Esophagogastroduodenoscopy N/A 04/03/2013    Procedure: ESOPHAGOGASTRODUODENOSCOPY (EGD);  Surgeon: JIrene Shipper MD;  [facility];  Service: Endoscopy;  Laterality: N/A;  changed to moderate  dr. pHenrene Pastordid not want to go to the o.r. for an endo colon-jmt  . Colonoscopy N/A 04/03/2013    Procedure: COLONOSCOPY;  Surgeon: JIrene Shipper MD;  [facility];  Service: Endoscopy;  Laterality: N/A;  . Eye surgery Left     "when i was a very small boy"  . Total hip arthroplasty Right 11/11/2014    Procedure: RIGHT TOTAL HIP ARTHROPLASTY ANTERIOR APPROACH;  Surgeon: CMcarthur Rossetti MD;  [place] [facility]  Service: Orthopedics;  Laterality: Right;  . Total hip arthroplasty Left 01/29/2015    Procedure: LEFT TOTAL HIP ARTHROPLASTY ANTERIOR APPROACH;  Surgeon: CMcarthur Rossetti MD;  [place] [facility]  Service: Orthopedics;  Laterality: Left;    Patient Care Team: SAntonietta Jewel MD as PCP - General (Internal Medicine)  Social History   Social History  . Marital Status: Single    Spouse Name: N/A  . Number of Children: N/A  . Years of Education: N/A   Occupational History  . Disabled    Social History Main Topics  . Smoking status: Former Smoker    Types: Cigarettes    Quit date: 04/25/2004  . Smokeless tobacco: Never Used  . Alcohol Use: No  . Drug Use: No  . Sexual Activity: No   Other Topics Concern  . Not on file   Social History Narrative     reports that he quit smoking about 11 years  ago. His smoking use included Cigarettes. He has never used smokeless tobacco. He reports that he does not drink alcohol or use illicit drugs.  Family History  Problem Relation Age of Onset  . Anesthesia problems Neg Hx   . Cancer Mother     ?  Marland Kitchen Heart failure Father   . Hypertension Mother   . Cancer Brother    Family Status  Relation Status Death Age  . Mother Deceased 54  . Father Deceased 36  . Brother Deceased 18  . Brother Deceased   . Brother Deceased   . Brother Alive   . Sister Alive     Immunization History  Administered Date(s) Administered  . Influenza,inj,Quad PF,36+ Mos 04/02/2013    No Known Allergies  Medications: Patient's  Medications  New Prescriptions   No medications on file  Previous Medications   ASPIRIN 325 MG EC TABLET    Take 1 tablet (325 mg total) by mouth 2 (two) times daily after a meal.   METHOCARBAMOL (ROBAXIN) 500 MG TABLET    Take 1 tablet (500 mg total) by mouth every 6 (six) hours as needed for muscle spasms.   OXYCODONE-ACETAMINOPHEN (ROXICET) 5-325 MG TABLET    Take 1-2 tablets by mouth every 4 (four) hours as needed.  Modified Medications   No medications on file  Discontinued Medications   No medications on file    Review of Systems  Musculoskeletal: Positive for joint swelling, arthralgias and gait problem.  All other systems reviewed and are negative.   Filed Vitals:   11/18/14 0221  BP: 126/66  Pulse: 80  Temp: 97.8 F (36.6 C)  Weight: 136 lb (61.689 kg)  SpO2: 96%   Body mass index is 18.44 kg/(m^2).  Physical Exam  Constitutional: He appears well-developed.  Lying in bed in NAD, frail appearing  HENT:  Mouth/Throat: Oropharynx is clear and moist.  Eyes: Pupils are equal, round, and reactive to light. No scleral icterus.  Neck: Neck supple. Carotid bruit is not present.  Cardiovascular: Normal rate, regular rhythm, normal heart sounds and intact distal pulses.  Exam reveals no gallop and no friction rub.   No murmur heard. +1 pitting right LE edema. Trace LLE edema. No calf TTP. Left foot dressing intact  Pulmonary/Chest: Effort normal and breath sounds normal. He has no wheezes. He has no rales. He exhibits no tenderness.  Abdominal: Soft. Bowel sounds are normal. He exhibits no distension, no abdominal bruit, no pulsatile midline mass and no mass. There is no tenderness. There is no rebound and no guarding.  Musculoskeletal: He exhibits edema and tenderness.  Right anterior thigh dressing c/d/i.  RLE +1 pitting edema.   Lymphadenopathy:    He has no cervical adenopathy.  Neurological: He is alert.  Skin: Skin is warm and dry. No rash noted.  Psychiatric: He  has a normal mood and affect. His behavior is normal.     Labs reviewed: Admission on 11/11/2014, Discharged on 11/14/2014  Component Date Value Ref Range Status  . ABO/RH(D) 11/11/2014 B POS   Final  . Antibody Screen 11/11/2014 NEG   Final  . Sample Expiration 11/11/2014 11/14/2014   Final  . Unit Number 11/11/2014 [id number]   Final  . Blood Component Type 11/11/2014 RED CELLS,LR   Final  . Unit division 11/11/2014 00   Final  . Status of Unit 11/11/2014 REL FROM [facility]   Final  . Transfusion Status 11/11/2014 OK TO TRANSFUSE   Final  . Crossmatch Result  11/11/2014 COMPATIBLE   Final  . Unit Number 11/11/2014 [id number]   Final  . Blood Component Type 11/11/2014 RED CELLS,LR   Final  . Unit division 11/11/2014 00   Final  . Status of Unit 11/11/2014 REL FROM [facility]   Final  . Transfusion Status 11/11/2014 OK TO TRANSFUSE   Final  . Crossmatch Result 11/11/2014 COMPATIBLE   Final  . WBC 11/12/2014 6.4  4.0 - 10.5 K/uL Final  . RBC 11/12/2014 3.81* 4.22 - 5.81 MIL/uL Final  . Hemoglobin 11/12/2014 8.4* 13.0 - 17.0 g/dL Final  . HCT 11/12/2014 26.8* 39.0 - 52.0 % Final  . MCV 11/12/2014 70.3* 78.0 - 100.0 fL Final  . MCH 11/12/2014 22.0* 26.0 - 34.0 pg Final  . MCHC 11/12/2014 31.3  30.0 - 36.0 g/dL Final  . RDW 11/12/2014 16.8* 11.5 - 15.5 % Final  . Platelets 11/12/2014 239  150 - 400 K/uL Final  . Sodium 11/12/2014 134* 135 - 145 mmol/L Final  . Potassium 11/12/2014 3.6  3.5 - 5.1 mmol/L Final  . Chloride 11/12/2014 101  101 - 111 mmol/L Final  . CO2 11/12/2014 24  22 - 32 mmol/L Final  . Glucose, Bld 11/12/2014 165* 65 - 99 mg/dL Final  . BUN 11/12/2014 6  6 - 20 mg/dL Final  . Creatinine, Ser 11/12/2014 0.60* 0.61 - 1.24 mg/dL Final  . Calcium 11/12/2014 8.7* 8.9 - 10.3 mg/dL Final  . GFR calc non Af Amer 11/12/2014 >60  >60 mL/min Final  . GFR calc Af Amer 11/12/2014 >60  >60 mL/min Final   Comment: (NOTE) The eGFR has been calculated using the CKD EPI  equation. This calculation has not been validated in all clinical situations. eGFR's persistently <60 mL/min signify possible Chronic Kidney Disease.   . Anion gap 11/12/2014 9  5 - 15 Final  . WBC 11/13/2014 9.8  4.0 - 10.5 K/uL Final  . RBC 11/13/2014 3.70* 4.22 - 5.81 MIL/uL Final  . Hemoglobin 11/13/2014 8.3* 13.0 - 17.0 g/dL Final  . HCT 11/13/2014 25.8* 39.0 - 52.0 % Final  . MCV 11/13/2014 69.7* 78.0 - 100.0 fL Final  . MCH 11/13/2014 22.4* 26.0 - 34.0 pg Final  . MCHC 11/13/2014 32.2  30.0 - 36.0 g/dL Final  . RDW 11/13/2014 16.8* 11.5 - 15.5 % Final  . Platelets 11/13/2014 225  150 - 400 K/uL Final  . WBC 11/14/2014 7.9  4.0 - 10.5 K/uL Final  . RBC 11/14/2014 3.44* 4.22 - 5.81 MIL/uL Final  . Hemoglobin 11/14/2014 7.8* 13.0 - 17.0 g/dL Final  . HCT 11/14/2014 24.4* 39.0 - 52.0 % Final  . MCV 11/14/2014 70.9* 78.0 - 100.0 fL Final  . MCH 11/14/2014 22.7* 26.0 - 34.0 pg Final  . MCHC 11/14/2014 32.0  30.0 - 36.0 g/dL Final  . RDW 11/14/2014 17.0* 11.5 - 15.5 % Final  . Platelets 11/14/2014 223  150 - 400 K/uL Final  Hospital Outpatient Visit on 10/30/2014  Component Date Value Ref Range Status  . Sodium 10/30/2014 140  135 - 145 mmol/L Final  . Potassium 10/30/2014 3.8  3.5 - 5.1 mmol/L Final  . Chloride 10/30/2014 107  101 - 111 mmol/L Final  . CO2 10/30/2014 25  22 - 32 mmol/L Final  . Glucose, Bld 10/30/2014 86  65 - 99 mg/dL Final  . BUN 10/30/2014 8  6 - 20 mg/dL Final  . Creatinine, Ser 10/30/2014 0.60* 0.61 - 1.24 mg/dL Final  . Calcium 10/30/2014 9.9  8.9 -  10.3 mg/dL Final  . GFR calc non Af Amer 10/30/2014 >60  >60 mL/min Final  . GFR calc Af Amer 10/30/2014 >60  >60 mL/min Final   Comment: (NOTE) The eGFR has been calculated using the CKD EPI equation. This calculation has not been validated in all clinical situations. eGFR's persistently <60 mL/min signify possible Chronic Kidney Disease.   . Anion gap 10/30/2014 8  5 - 15 Final  . WBC 10/30/2014 4.0   4.0 - 10.5 K/uL Final  . RBC 10/30/2014 4.41  4.22 - 5.81 MIL/uL Final  . Hemoglobin 10/30/2014 9.8* 13.0 - 17.0 g/dL Final  . HCT 10/30/2014 31.1* 39.0 - 52.0 % Final  . MCV 10/30/2014 70.5* 78.0 - 100.0 fL Final  . MCH 10/30/2014 22.2* 26.0 - 34.0 pg Final  . MCHC 10/30/2014 31.5  30.0 - 36.0 g/dL Final  . RDW 10/30/2014 16.8* 11.5 - 15.5 % Final  . Platelets 10/30/2014 317  150 - 400 K/uL Final  . MRSA, PCR 10/30/2014 NEGATIVE  NEGATIVE Final  . Staphylococcus aureus 10/30/2014 NEGATIVE  NEGATIVE Final   Comment:        The Xpert SA Assay (FDA approved for NASAL specimens in patients over 83 years of age), is one component of a comprehensive surveillance program.  Test performance has been validated by [facility] for patients greater than or equal to 2 year old. It is not intended to diagnose infection nor to guide or monitor treatment.   Admission on 09/03/2014, Discharged on 09/03/2014  Component Date Value Ref Range Status  . WBC 09/03/2014 3.8* 4.0 - 10.5 K/uL Final  . RBC 09/03/2014 4.49  4.22 - 5.81 MIL/uL Final  . Hemoglobin 09/03/2014 9.9* 13.0 - 17.0 g/dL Final  . HCT 09/03/2014 31.7* 39.0 - 52.0 % Final  . MCV 09/03/2014 70.6* 78.0 - 100.0 fL Final  . MCH 09/03/2014 22.0* 26.0 - 34.0 pg Final  . MCHC 09/03/2014 31.2  30.0 - 36.0 g/dL Final  . RDW 09/03/2014 16.4* 11.5 - 15.5 % Final  . Platelets 09/03/2014 294  150 - 400 K/uL Final  . Neutrophils Relative % 09/03/2014 64  43 - 77 % Final  . Neutro Abs 09/03/2014 2.4  1.7 - 7.7 K/uL Final  . Lymphocytes Relative 09/03/2014 20  12 - 46 % Final  . Lymphs Abs 09/03/2014 0.8  0.7 - 4.0 K/uL Final  . Monocytes Relative 09/03/2014 10  3 - 12 % Final  . Monocytes Absolute 09/03/2014 0.4  0.1 - 1.0 K/uL Final  . Eosinophils Relative 09/03/2014 5  0 - 5 % Final  . Eosinophils Absolute 09/03/2014 0.2  0.0 - 0.7 K/uL Final  . Basophils Relative 09/03/2014 1  0 - 1 % Final  . Basophils Absolute 09/03/2014 0.0  0.0 - 0.1  K/uL Final  . Sodium 09/03/2014 138  135 - 145 mmol/L Final  . Potassium 09/03/2014 3.2* 3.5 - 5.1 mmol/L Final  . Chloride 09/03/2014 105  101 - 111 mmol/L Final  . CO2 09/03/2014 21* 22 - 32 mmol/L Final  . Glucose, Bld 09/03/2014 148* 70 - 99 mg/dL Final  . BUN 09/03/2014 5* 6 - 20 mg/dL Final  . Creatinine, Ser 09/03/2014 0.65  0.61 - 1.24 mg/dL Final  . Calcium 09/03/2014 9.4  8.9 - 10.3 mg/dL Final  . Total Protein 09/03/2014 7.9  6.5 - 8.1 g/dL Final  . Albumin 09/03/2014 3.2* 3.5 - 5.0 g/dL Final  . AST 09/03/2014 36  15 - 41 U/L  Final  . ALT 09/03/2014 15* 17 - 63 U/L Final  . Alkaline Phosphatase 09/03/2014 108  38 - 126 U/L Final  . Total Bilirubin 09/03/2014 0.5  0.3 - 1.2 mg/dL Final  . GFR calc non Af Amer 09/03/2014 >60  >60 mL/min Final  . GFR calc Af Amer 09/03/2014 >60  >60 mL/min Final   Comment: (NOTE) The eGFR has been calculated using the CKD EPI equation. This calculation has not been validated in all clinical situations. eGFR's persistently <60 mL/min signify possible Chronic Kidney Disease.   . Anion gap 09/03/2014 12  5 - 15 Final    No results found.   Assessment/Plan   ICD-9-CM ICD-10-CM   1. Status post total replacement of right hip V43.64 Z96.649   2. 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 Hyperlipidemia 272.4 E78.5   5. Depression 311 F32.9   6. Microcytic anemia 280.9 D50.9    Wound care as ordered/ dressing changes as indicated  Cont current meds as ordered  Pain control  F/u with ortho as scheduled  ASA for DVT prophylaxis  PT/OT as ordered  GOAL: short term rehab and d/c home when medically appropriate. Communicated with pt and nursing.  Will follow  Domenic Schoenberger S. Perlie Gold  [facility] [address] [place], [place] [address] [phone] Cell  (Monday-Friday 8 AM - 5 PM) [phone] After 5 PM and follow prompts

## 2015-08-03 DIAGNOSIS — Z79899 Other long term (current) drug therapy: Secondary | ICD-10-CM | POA: Insufficient documentation

## 2015-10-13 DIAGNOSIS — M545 Low back pain, unspecified: Secondary | ICD-10-CM | POA: Insufficient documentation

## 2015-10-13 DIAGNOSIS — G8929 Other chronic pain: Secondary | ICD-10-CM | POA: Insufficient documentation

## 2016-02-22 ENCOUNTER — Encounter: Payer: Self-pay | Admitting: Internal Medicine

## 2016-03-03 ENCOUNTER — Encounter: Payer: Self-pay | Admitting: Internal Medicine

## 2016-03-31 ENCOUNTER — Ambulatory Visit: Payer: Medicare Other | Attending: Internal Medicine | Admitting: Physical Therapy

## 2016-03-31 DIAGNOSIS — M5440 Lumbago with sciatica, unspecified side: Secondary | ICD-10-CM | POA: Insufficient documentation

## 2016-03-31 DIAGNOSIS — R2689 Other abnormalities of gait and mobility: Secondary | ICD-10-CM | POA: Insufficient documentation

## 2016-03-31 DIAGNOSIS — R262 Difficulty in walking, not elsewhere classified: Secondary | ICD-10-CM | POA: Diagnosis present

## 2016-03-31 NOTE — Patient Instructions (Signed)
HIP: Hamstrings - Short Sitting    Rest leg on raised surface. Keep knee straight. Lift chest. Hold _30__ seconds. __3_ reps per set, __2_ sets per day, __7_ days per week  Copyright  VHI. All rights reserved.    Roll    Inhale and bring shoulders up, back, then exhale and relax shoulders down. Repeat ___ times. Do ___ times per day.  Copyright  VHI. All rights reserved.  Pelvic Tilt    Flatten back by tightening stomach muscles and buttocks. Repeat __10__ times per set. Do _2___ sets per session. Do __2__ sessions per day.  http://orth.exer.us/134      Copyright  VHI. All rights reserved.   Lumbar Rotation (Non-Weight Bearing)    Feet on floor, slowly rock knees from side to side in small, pain-free range of motion. Allow lower back to rotate slightly. Repeat __10__ times per set. Do __2__ sets per session. Do __2__ sessions per day.  http://orth.exer.us/160   Copyright  VHI. All rights reserved.

## 2016-04-01 NOTE — Therapy (Signed)
[place], [place], [address] Phone: [phone]   Fax:  [phone]  Physical Therapy Evaluation  Patient Details  Name: Jonathan [name] MRN: [id number] Date of Birth: 1961/04/05 Referring Provider: Dr. Sinclair Ship   Encounter Date: 03/31/2016      PT End of Session - 04/01/16 1208    Visit Number 1   Number of Visits 16   Date for PT Re-Evaluation 05/26/16   PT Start Time [id number]   PT Stop Time 1505   PT Time Calculation (min) 50 min   Activity Tolerance Patient tolerated treatment well;Patient limited by pain   Behavior During Therapy [facility] for tasks assessed/performed      Past Medical History:  Diagnosis Date  . Anemia    H/o using iron in the past   . Arthritis    rheumatoid  . Chronic back pain   . Joint pain   . Joint swelling   . Rheumatoid arthritis(714.0)     Past Surgical History:  Procedure Laterality Date  . COLONOSCOPY N/A 04/03/2013   Procedure: COLONOSCOPY;  Surgeon: Irene Shipper, MD;  Location: [facility];  Service: Endoscopy;  Laterality: N/A;  . ESOPHAGOGASTRODUODENOSCOPY N/A 04/03/2013   Procedure: ESOPHAGOGASTRODUODENOSCOPY (EGD);  Surgeon: Irene Shipper, MD;  Location: [facility];  Service: Endoscopy;  Laterality: N/A;  changed to moderate dr. Henrene Pastor did not want to go to the o.r. for an endo colon-jmt  . EYE SURGERY Left    "when i was a very small boy"  . JOINT REPLACEMENT     L TKA  also right in 05/2011  . KNEE ARTHROPLASTY  06/07/2011   Procedure: COMPUTER ASSISTED TOTAL KNEE ARTHROPLASTY;  Surgeon: Mcarthur Rossetti, MD;  Location: [place];  Service: Orthopedics;  Laterality: Right;  Right total knee arthoplasty  . KNEE CLOSED REDUCTION  07/19/2011   Procedure: CLOSED MANIPULATION KNEE;  Surgeon: Mcarthur Rossetti, MD;  Location: [place];  Service: Orthopedics;  Laterality: Right;  Manipulation under anesthesia right knee  . TOTAL HIP ARTHROPLASTY Right  11/11/2014   Procedure: RIGHT TOTAL HIP ARTHROPLASTY ANTERIOR APPROACH;  Surgeon: Mcarthur Rossetti, MD;  Location: [place];  Service: Orthopedics;  Laterality: Right;  . TOTAL HIP ARTHROPLASTY Left 01/29/2015   Procedure: LEFT TOTAL HIP ARTHROPLASTY ANTERIOR APPROACH;  Surgeon: Mcarthur Rossetti, MD;  Location: [place];  Service: Orthopedics;  Laterality: Left;    There were no vitals filed for this visit.       Subjective Assessment - 03/31/16 1423    Subjective Patient presents with back pain which has been chronic for approx. 3 yrs.  Patient has stiffness, pain, difficulty bending, squatting, lifting and transfers are painful and difficult. Patient reports sensory disturbance in back, legs and weakness in legs.  He has limited ROM in UEs as well, due to RA.    Pertinent History RA, bilateral hip replacements, TKR bilateral,    Limitations Sitting;Lifting;Standing;Walking;House hold activities;Other (comment)  transfers, bed mobility   How long can you sit comfortably? not long    How long can you stand comfortably? not comfortable   How long can you walk comfortably? not comfortable, incr pain    Diagnostic tests none recent    Patient Stated Goals Pt would like to be able to have less pain, move better    Currently in Pain? Yes   Pain Score 7    Pain Location Back   Pain Orientation Lower;Posterior   Pain Type Chronic  pain   Pain Radiating Towards post thighs to feet    Pain Onset More than a month ago   Pain Frequency Constant   Aggravating Factors  standing, walking    Pain Relieving Factors sitting, pain meds, hot shower    Effect of Pain on Daily Activities all aspects of mobility             [facility] PT Assessment - 03/31/16 1432      Assessment   Medical Diagnosis back pain    Referring Provider Dr. Sinclair Ship    Onset Date/Surgical Date --  3 yrs   Next MD Visit 2 weeks    Prior Therapy No had for knees and hip     Precautions   Precautions None      Restrictions   Weight Bearing Restrictions No     Balance Screen   Has the patient fallen in the past 6 months No     [place] residence   Living Arrangements Other relatives   Type of [place] Access Level entry     Prior Function   Level of Independence Requires assistive device for independence;Needs assistance with ADLs;Needs assistance with homemaking;Needs assistance with transfers   Vocation On disability   Leisure watch TV, play with dogs     Cognition   Overall Cognitive Status Within Functional Limits for tasks assessed     Observation/Other Assessments   Focus on Therapeutic Outcomes (FOTO)  50%     Sensation   Light Touch Impaired by gross assessment   Additional Comments hands, feet numb      Coordination   Fine Motor Movements are Fluid and Coordinated --  NT but impaired due to RA     Posture/Postural Control   Posture/Postural Control Postural limitations   Postural Limitations Rounded Shoulders;Forward head;Increased thoracic kyphosis;Posterior pelvic tilt;Flexed trunk     AROM   Overall AROM Comments knees lack 10-20 deg ext    AROM Assessment Site --  pain with all motions    Lumbar Flexion unable to do in standing    Lumbar Extension unable to get to neutral    Lumbar - Right Side Bend unable    Lumbar - Left Side Bend unable   Lumbar - Right Rotation 75%   Lumbar - Left Rotation 75%     Strength   Right Hip Flexion 3-/5   Right Hip ABduction 4-/5   Left Hip Flexion 3-/5   Left Hip ABduction 3+/5   Right Knee Flexion 4+/5   Right Knee Extension 4+/5   Left Knee Flexion 4+/5   Left Knee Extension 4+/5   Right/Left Ankle --  min to no motion at ankle      Palpation   Spinal mobility unable to get prone   Palpation comment hypertonic throughout lumbar paraspinals and into bilateralhips.      Transfers   Transfers Sit to Stand   Sit to Stand 4: Min guard   Transfer Cueing unable to hinge  forward due to tight hips, pain in spine     Ambulation/Gait   Ambulation/Gait Yes   Ambulation/Gait Assistance 5: Supervision   Ambulation/Gait Assistance Details safety issues due to improper footwear, posture    Ambulation Distance (Feet) 100 Feet   Assistive device Straight cane   Gait Pattern Step-to pattern;Decreased stride length;Decreased hip/knee flexion - right;Decreased hip/knee flexion - left;Decreased dorsiflexion - right;Decreased dorsiflexion - left;Right flexed knee in stance;Left flexed  knee in stance;Shuffle       MHP for 10 min post session in supine                     PT Education - 2016/04/02 0803    Education provided Yes   Education Details PT/POC, HEP, prognosis, transfers   Person(s) Educated Patient   Methods Explanation;Demonstration;Tactile cues;Verbal cues;Handout   Comprehension Returned demonstration;Verbalized understanding;Need further instruction;Tactile cues required;Verbal cues required          PT Short Term Goals - 2016-04-02 1213      PT SHORT TERM GOAL #1   Title Pt will be I with HEP    Time 4   Period Weeks   Status New     PT SHORT TERM GOAL #2   Title Pt will sit to stand with min increase in pain in low back and no physical assist    Time 4   Period Weeks   Status New     PT SHORT TERM GOAL #3   Title Pt will report 10-15 % improvement in back pain with ADLs.    Time 4   Period Weeks   Status New     PT SHORT TERM GOAL #4   Title Balance screen completed and goal set as needed    Time 4   Period Weeks   Status New     PT SHORT TERM GOAL #5   Title More goals will be set if patient improving after 2 -3 weeks                   Plan - 04/02/2016 1209    Clinical Impression Statement Patient with low complexity eval of patient with rhemuatoid arthritis and low back pain. He is limited in all aspects of mobility, pain and stiffness throughout his joints.  He has had multiple joint surgeries.  He  will benefit from education, modalities and HEP and 8 weeks of PT if improving.    Rehab Potential Fair   PT Frequency 2x / week   PT Duration 8 weeks   PT Treatment/Interventions ADLs/Self Care Home Management;Moist Heat;Therapeutic activities;Therapeutic exercise;Balance training;Manual techniques;Gait training;Electrical Stimulation;Functional mobility training;Patient/family education;Passive range of motion   PT Next Visit Plan check HEP,  trunk mobility, hip ROM and work towards sit to stand with less pain, NuStep   PT Home Exercise Plan HS, shouder roll, LTR, PPT    Consulted and Agree with Plan of Care Patient      Patient will benefit from skilled therapeutic intervention in order to improve the following deficits and impairments:  Abnormal gait, Decreased coordination, Decreased range of motion, Difficulty walking, Increased fascial restricitons, Impaired UE functional use, Decreased endurance, Decreased activity tolerance, Pain, Improper body mechanics, Impaired flexibility, Hypomobility, Decreased balance, Decreased mobility, Decreased strength, Impaired sensation, Postural dysfunction  Visit Diagnosis: Other abnormalities of gait and mobility  Bilateral low back pain with sciatica, sciatica laterality unspecified, unspecified chronicity  Difficulty in walking, not elsewhere classified      G-Codes - 04-02-16 1216    Functional Assessment Tool Used FOTO   Functional Limitation Mobility: Walking and moving around   Mobility: Walking and Moving Around Current Status [phone]) At least 40 percent but less than 60 percent impaired, limited or restricted   Mobility: Walking and Moving Around Goal Status [phone]) At least 20 percent but less than 40 percent impaired, limited or restricted       Problem List Patient Active Problem List  Diagnosis Date Noted  . Rheumatoid arthritis involving left hip ([place]) 01/29/2015  . 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 Status post total replacement of right hip 11/11/2014  . Gait difficulty 08/21/2013  . Benign neoplasm of colon 04/03/2013  . Reflux esophagitis 04/03/2013  . Microcytic anemia 04/01/2013  . Chronic ulcer of right leg ([place]) 01/13/2013  . Pressure ulcer 01/13/2013  . 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 Degenerative arthritis of knee 06/07/2011    PAA,JENNIFER 04/01/2016, 12:17 PM  [facility] [address] [place], [place], [address] Phone: [phone]   Fax:  [phone]  Name: Sharbel Cioffi MRN: [id number] Date of Birth: 1960/09/29  Raeford Razor, PT 04/01/16 12:17 PM Phone: [phone] Fax: [phone]

## 2016-04-11 ENCOUNTER — Ambulatory Visit (INDEPENDENT_AMBULATORY_CARE_PROVIDER_SITE_OTHER): Payer: Medicare Other

## 2016-04-11 ENCOUNTER — Ambulatory Visit (INDEPENDENT_AMBULATORY_CARE_PROVIDER_SITE_OTHER): Payer: Medicare Other | Admitting: Physician Assistant

## 2016-04-11 ENCOUNTER — Encounter (INDEPENDENT_AMBULATORY_CARE_PROVIDER_SITE_OTHER): Payer: Self-pay | Admitting: Physician Assistant

## 2016-04-11 DIAGNOSIS — M545 Low back pain: Secondary | ICD-10-CM | POA: Diagnosis not present

## 2016-04-11 NOTE — Progress Notes (Signed)
Office Visit Note   Patient: Jonathan [name]           Date of Birth: 1960/09/22           MRN: [id number] Visit Date: 04/11/2016              Requested by: Antonietta Jewel, MD [address] [place], [place] [address] PCP: Antonietta Jewel, MD   Assessment & Plan: Visit Diagnoses:  1. Low back pain, unspecified back pain laterality, unspecified chronicity, with sciatica presence unspecified     Plan: Physical therapy for his back. Pain persists despite conservative treatment of recommend MRI to rule out HNP  Follow-Up Instructions: Return in about 4 weeks (around 05/09/2016).   Orders:  Orders Placed This Encounter  Procedures  . XR Lumbar Spine 2-3 Views   No orders of the defined types were placed in this encounter.     Procedures: No procedures performed   Clinical Data: No additional findings.   Subjective: Chief Complaint  Patient presents with  . Lower Back - Pain    Jonathan [name] is complaining today of back pain. He states that it started after his hip surgery in 01/2015.  He notes pain that radiates down the lateral aspect of both legs to the knee. He has known lentoid arthritis is on methotrexate. He also is taking Robaxin. Currently in therapy for both hips which she had replaced in October 2016 and July 2016.    Review of Systems See history of present illness  Objective: Vital Signs: There were no vitals taken for this visit.  Physical Exam  Constitutional: He is oriented to person, place, and time. He appears well-developed and well-nourished. No distress.  Cardiovascular: Intact distal pulses.   Neurological: He is alert and oriented to person, place, and time.  Skin: Skin is warm and dry.  Psychiatric: He has a normal mood and affect.    Back Exam   Muscle Strength  Right Quadriceps:  5/5  Left Quadriceps:  5/5  Right Hamstrings:  5/5  Left Hamstrings:  5/5   Tests  Straight leg raise right: negative Straight leg raise left:  negative  Reflexes  Patellar: normal Achilles reflexes: Has very stiff ankles bilaterally due to his rheumatoid arthritis and I'm unable to elicit Achilles reflex.  Comments:  Has limited flexion and extension of lumbar spine. Tenderness over the lower lumbar spine in the region of L4-S1. Decreased sensation bilateral feet throughout and also over the lateral aspect of the distal tib-fib bilaterally.      Specialty Comments:  No specialty comments available.  Imaging: Xr Lumbar Spine 2-3 Views  Result Date: 04/11/2016 AP and lateral lumbar spine: No acute fracture. No spondylosis thesis. Degenerative disc disease at L5-S1. Normal lordotic lordotic curvature is maintained.     PMFS History: Patient Active Problem List   Diagnosis Date Noted  . Rheumatoid arthritis involving left hip ([place]) 01/29/2015  . 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 Status post total replacement of right hip 11/11/2014  . Gait difficulty 08/21/2013  . Benign neoplasm of colon 04/03/2013  . Reflux esophagitis 04/03/2013  . Microcytic anemia 04/01/2013  . Chronic ulcer of right leg ([place]) 01/13/2013  . Pressure ulcer 01/13/2013  . 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 Degenerative arthritis of knee 06/07/2011   Past Medical History:  Diagnosis Date  . Anemia    H/o using iron in the past   .  Arthritis    rheumatoid  . Chronic back pain   . Joint pain   . Joint swelling   . Rheumatoid arthritis(714.0)     Family History  Problem Relation Age of Onset  . Anesthesia problems Neg Hx   . Cancer Mother     ?  Marland Kitchen Heart failure Father   . Hypertension Mother   . Cancer Brother     Past Surgical History:  Procedure Laterality Date  . COLONOSCOPY N/A 04/03/2013   Procedure: COLONOSCOPY;  Surgeon: Irene Shipper, MD;  Location: [facility];  Service: Endoscopy;  Laterality: N/A;  . ESOPHAGOGASTRODUODENOSCOPY N/A  04/03/2013   Procedure: ESOPHAGOGASTRODUODENOSCOPY (EGD);  Surgeon: Irene Shipper, MD;  Location: [facility];  Service: Endoscopy;  Laterality: N/A;  changed to moderate dr. Henrene Pastor did not want to go to the o.r. for an endo colon-jmt  . EYE SURGERY Left    "when i was a very small boy"  . JOINT REPLACEMENT     L TKA  also right in 05/2011  . KNEE ARTHROPLASTY  06/07/2011   Procedure: COMPUTER ASSISTED TOTAL KNEE ARTHROPLASTY;  Surgeon: Mcarthur Rossetti, MD;  Location: [place];  Service: Orthopedics;  Laterality: Right;  Right total knee arthoplasty  . KNEE CLOSED REDUCTION  07/19/2011   Procedure: CLOSED MANIPULATION KNEE;  Surgeon: Mcarthur Rossetti, MD;  Location: [place];  Service: Orthopedics;  Laterality: Right;  Manipulation under anesthesia right knee  . TOTAL HIP ARTHROPLASTY Right 11/11/2014   Procedure: RIGHT TOTAL HIP ARTHROPLASTY ANTERIOR APPROACH;  Surgeon: Mcarthur Rossetti, MD;  Location: [place];  Service: Orthopedics;  Laterality: Right;  . TOTAL HIP ARTHROPLASTY Left 01/29/2015   Procedure: LEFT TOTAL HIP ARTHROPLASTY ANTERIOR APPROACH;  Surgeon: Mcarthur Rossetti, MD;  Location: [place];  Service: Orthopedics;  Laterality: Left;   Social History   Occupational History  . Disabled    Social History Main Topics  . Smoking status: Former Smoker    Types: Cigarettes    Quit date: 04/25/2004  . Smokeless tobacco: Never Used  . Alcohol use No  . Drug use: No  . Sexual activity: No

## 2016-04-12 ENCOUNTER — Ambulatory Visit: Payer: Medicare Other | Admitting: Physical Therapy

## 2016-04-13 ENCOUNTER — Ambulatory Visit: Payer: Medicare Other | Admitting: Physical Therapy

## 2016-04-19 ENCOUNTER — Ambulatory Visit: Payer: Medicare Other | Admitting: Physical Therapy

## 2016-04-21 ENCOUNTER — Ambulatory Visit: Payer: Medicare Other | Admitting: Physical Therapy

## 2016-04-26 ENCOUNTER — Ambulatory Visit: Payer: Medicare Other | Admitting: Physical Therapy

## 2016-04-28 ENCOUNTER — Encounter: Payer: Medicare Other | Admitting: Physical Therapy

## 2016-04-29 ENCOUNTER — Ambulatory Visit (AMBULATORY_SURGERY_CENTER): Payer: Self-pay | Admitting: *Deleted

## 2016-04-29 VITALS — Ht 64.0 in | Wt 169.2 lb

## 2016-04-29 DIAGNOSIS — Z86010 Personal history of colonic polyps: Secondary | ICD-10-CM

## 2016-04-29 MED ORDER — NA SULFATE-K SULFATE-MG SULF 17.5-3.13-1.6 GM/177ML PO SOLN: ORAL | 0 refills | Status: DC

## 2016-04-29 NOTE — Progress Notes (Signed)
No egg or soy allergy  No anesthesia or intubation problems per pt  No diet medications taken  Registered in EMMI  No hx of sleep apnea or home oxygen used 

## 2016-05-02 ENCOUNTER — Ambulatory Visit: Payer: Medicare Other | Admitting: Physical Therapy

## 2016-05-03 ENCOUNTER — Encounter: Payer: Medicare Other | Admitting: Physical Therapy

## 2016-05-05 ENCOUNTER — Ambulatory Visit: Payer: Medicare Other | Attending: Internal Medicine | Admitting: Physical Therapy

## 2016-05-05 ENCOUNTER — Encounter: Payer: Medicare Other | Admitting: Physical Therapy

## 2016-05-05 DIAGNOSIS — M5440 Lumbago with sciatica, unspecified side: Secondary | ICD-10-CM

## 2016-05-05 DIAGNOSIS — R2689 Other abnormalities of gait and mobility: Secondary | ICD-10-CM | POA: Diagnosis present

## 2016-05-05 DIAGNOSIS — R262 Difficulty in walking, not elsewhere classified: Secondary | ICD-10-CM | POA: Diagnosis present

## 2016-05-05 NOTE — Therapy (Addendum)
[place], [place], [address] Phone: [phone]   Fax:  [phone]  Physical Therapy Treatment/Renewal/Discharge Addendum  Patient Details  Name: Jonathan [name] Date of Birth: 08/20/1960 Referring Provider: Dr. Sinclair Ship   Encounter Date: 05/05/2016      Jonathan [name] End of Session - 05/05/16 1434    Visit Number 2   Number of Visits 16   Date for Jonathan [name] Re-Evaluation 06/30/16   Jonathan [name] Start Time [id number]   Jonathan [name] Stop Time 1520   Jonathan [name] Time Calculation (min) 58 min   Activity Tolerance Patient tolerated treatment well;Patient limited by pain   Behavior During Therapy [facility] for tasks assessed/performed      Past Medical History:  Diagnosis Date  . Anemia    H/o using iron in the past   . Arthritis    rheumatoid  . Chronic back pain   . Hyperlipidemia   . Joint pain   . Joint swelling   . Rheumatoid arthritis(714.0)     Past Surgical History:  Procedure Laterality Date  . COLONOSCOPY N/A 04/03/2013   Procedure: COLONOSCOPY;  Surgeon: Irene Shipper, MD;  Location: [facility];  Service: Endoscopy;  Laterality: N/A;  . COLONOSCOPY    . ESOPHAGOGASTRODUODENOSCOPY N/A 04/03/2013   Procedure: ESOPHAGOGASTRODUODENOSCOPY (EGD);  Surgeon: Irene Shipper, MD;  Location: [facility];  Service: Endoscopy;  Laterality: N/A;  changed to moderate dr. Henrene Pastor did not want to go to the o.r. for an endo colon-jmt  . EYE SURGERY Left    "when i was a very small boy"  . JOINT REPLACEMENT     both knees  . KNEE ARTHROPLASTY  06/07/2011   Procedure: COMPUTER ASSISTED TOTAL KNEE ARTHROPLASTY;  Surgeon: Mcarthur Rossetti, MD;  Location: [place];  Service: Orthopedics;  Laterality: Right;  Right total knee arthoplasty  . KNEE CLOSED REDUCTION  07/19/2011   Procedure: CLOSED MANIPULATION KNEE;  Surgeon: Mcarthur Rossetti, MD;  Location: [place];  Service: Orthopedics;  Laterality: Right;  Manipulation under anesthesia right  knee  . TOTAL HIP ARTHROPLASTY Right 11/11/2014   Procedure: RIGHT TOTAL HIP ARTHROPLASTY ANTERIOR APPROACH;  Surgeon: Mcarthur Rossetti, MD;  Location: [place];  Service: Orthopedics;  Laterality: Right;  . TOTAL HIP ARTHROPLASTY Left 01/29/2015   Procedure: LEFT TOTAL HIP ARTHROPLASTY ANTERIOR APPROACH;  Surgeon: Mcarthur Rossetti, MD;  Location: [place];  Service: Orthopedics;  Laterality: Left;    There were no vitals filed for this visit.      Subjective Assessment - 05/05/16 1425    Subjective Patient presents for first visit, his transportation expired but now he is set up.  Seeing pain mgmt MD next week or so.  Back hurts.    Currently in Pain? Yes   Pain Score 5    Pain Location Back   Pain Orientation Lower   Pain Descriptors / Indicators Sore   Pain Type Chronic pain   Pain Onset More than a month ago   Pain Frequency Constant   Aggravating Factors  standing, walking    Pain Relieving Factors sitting, rest heat , pain meds            [facility] Jonathan [name] Assessment - 05/05/16 1432      Assessment   Medical Diagnosis back pain    Onset Date/Surgical Date --  3 yrs   Next MD Visit 2 weeks    Prior Therapy No had for knees and hip  Precautions   Precautions None     Restrictions   Weight Bearing Restrictions No     Home Environment   Living Environment Private residence   Living Arrangements Other relatives   Type of [place] Access Level entry     Prior Function   Level of Independence Requires assistive device for independence;Needs assistance with ADLs;Needs assistance with homemaking;Needs assistance with transfers   Vocation On disability   Leisure watch TV, play with dogs     Cognition   Overall Cognitive Status Within Functional Limits for tasks assessed     Posture/Postural Control   Posture Comments hips tight and held in ER position , knees flexed      AROM   Overall AROM Comments knees lack 10-20 deg ext    Right Knee Extension 15    Right Knee Flexion 88   Left Knee Extension 10   Left Knee Flexion 80   Lumbar Flexion unable to do in standing    Lumbar Extension unable to get to neutral    Lumbar - Right Side Bend unable    Lumbar - Left Side Bend unable   Lumbar - Right Rotation 75%   Lumbar - Left Rotation 75%     PROM   Overall PROM Comments Hip flex Rt. 6o deg L. 50 deg      Strength   Right Hip Flexion 3-/5   Right Hip ABduction 4-/5   Left Hip Flexion 3-/5   Left Hip ABduction 3+/5     Ambulation/Gait   Ambulation/Gait Yes   Ambulation/Gait Assistance 5: Supervision   Ambulation Distance (Feet) 300 Feet   Assistive device Straight cane   Gait Pattern Step-to pattern;Decreased stride length;Decreased hip/knee flexion - right;Decreased hip/knee flexion - left;Decreased dorsiflexion - right;Decreased dorsiflexion - left;Right flexed knee in stance;Left flexed knee in stance;Shuffle   Gait Comments cues to lift/flex hip                      [facility] Adult Jonathan [name] Treatment/Exercise - 05/05/16 1442      Ambulation/Gait   Ambulation/Gait Yes   Ambulation/Gait Assistance 5: Supervision   Ambulation Distance (Feet) 300 Feet   Assistive device Straight cane   Gait Pattern Step-to pattern;Decreased stride length;Decreased hip/knee flexion - right;Decreased hip/knee flexion - left;Decreased dorsiflexion - right;Decreased dorsiflexion - left;Right flexed knee in stance;Left flexed knee in stance;Shuffle   Gait Comments cues to lift/flex hip      Posture/Postural Control   Posture Comments hips tight and held in ER position , knees flexed      Lumbar Exercises: Stretches   Active Hamstring Stretch 2 reps;30 seconds   Active Hamstring Stretch Limitations seated    Single Knee to Chest Stretch 2 reps;30 seconds   Single Knee to Chest Stretch Limitations using sheet, unable to grip well    Lower Trunk Rotation 5 reps;10 seconds   Lower Trunk Rotation Limitations x 10 , Jonathan [name] assist    Pelvic Tilt  Limitations x 10      Lumbar Exercises: Supine   Ab Set 10 reps   Heel Slides 10 reps   Bent Knee Raise 10 reps   Straight Leg Raise 10 reps   Straight Leg Raises Limitations needs A on L      Modalities   Modalities Moist Heat     Moist Heat Therapy   Number Minutes Moist Heat 10 Minutes   Moist Heat Location Lumbar Spine  Manual Therapy   Manual Therapy Passive ROM   Passive ROM hips ER, IR and flexion by Jonathan [name]                   Jonathan [name] Short Term Goals - 06/03/16 1515      Jonathan [name] SHORT TERM GOAL #1   Title Jonathan [name] will be I with HEP    Status On-going     Jonathan [name] SHORT TERM GOAL #2   Title Jonathan [name] will sit to stand with min increase in pain in low back and no physical assist    Status On-going     Jonathan [name] SHORT TERM GOAL #3   Title Jonathan [name] will report 10-15 % improvement in back pain with ADLs.    Status On-going     Jonathan [name] SHORT TERM GOAL #4   Title Balance screen completed and goal set as needed    Status On-going     Jonathan [name] SHORT TERM GOAL #5   Title More goals will be set if patient improving after 2 -3 weeks    Status On-going                  Plan - 06-03-2016 1510    Clinical Impression Statement Patient with joint stiffness throughout UE and LE , spine.  MEasured hip and knee today, worked towards increasing hip flexion for better gait and transfers.  He lost the previous copy of his exercises.  WIll resume Jonathan [name] with the hopes of reducing back pain.    Rehab Potential Fair   Jonathan [name] Frequency 2x / week   Jonathan [name] Duration 8 weeks   Jonathan [name] Treatment/Interventions ADLs/Self Care Home Management;Moist Heat;Therapeutic activities;Therapeutic exercise;Balance training;Manual techniques;Gait training;Electrical Stimulation;Functional mobility training;Patient/family education;Passive range of motion   Jonathan [name] Next Visit Plan check HEP,  trunk mobility, hip ROM and work towards sit to stand with less pain, NuStep, Passive stretch to hips.    Jonathan [name] Home Exercise Plan HS, shouder roll, LTR, PPT , knee to chest  ( AAROm)    Consulted and Agree with Plan of Care Patient      Patient will benefit from skilled therapeutic intervention in order to improve the following deficits and impairments:  Abnormal gait, Decreased coordination, Decreased range of motion, Difficulty walking, Increased fascial restricitons, Impaired UE functional use, Decreased endurance, Decreased activity tolerance, Pain, Improper body mechanics, Impaired flexibility, Hypomobility, Decreased balance, Decreased mobility, Decreased strength, Impaired sensation, Postural dysfunction  Visit Diagnosis: Other abnormalities of gait and mobility  Bilateral low back pain with sciatica, sciatica laterality unspecified, unspecified chronicity  Difficulty in walking, not elsewhere classified       G-Codes - Jun 03, 2016 1512    Functional Assessment Tool Used FOTO, clin judgement    Functional Limitation Mobility: Walking and moving around   Mobility: Walking and Moving Around Current Status [phone]) At least 40 percent but less than 60 percent impaired, limited or restricted   Mobility: Walking and Moving Around Goal Status [phone]) At least 20 percent but less than 40 percent impaired, limited or restricted      Problem List Patient Active Problem List   Diagnosis Date Noted  . Rheumatoid arthritis involving left hip ([place]) 01/29/2015  . 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 Status post total replacement of right hip 11/11/2014  . Gait difficulty 08/21/2013  . Benign neoplasm of colon 04/03/2013  . Reflux esophagitis 04/03/2013  . Microcytic anemia 04/01/2013  . Chronic ulcer  of right leg ([place]) 01/13/2013  . Pressure ulcer 01/13/2013  . 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 Degenerative arthritis of knee 06/07/2011    Jonathan [name] 05/05/2016, 3:17 PM  [facility] [address] [place],  [place], [address] Phone: [phone]   Fax:  [phone]  Name: Jonathan [name] MRN: [name] Date of Birth: May 20, 1960   Jonathan [name], Jonathan [name] 05/05/16 3:17 PM Phone: [phone] Fax: [phone]  DID NOT RETURN AFTER THIS RENEWAL. Jonathan [name], Jonathan [name] 07/11/16 1:55 PM Phone: [phone] Fax: [phone]

## 2016-05-05 NOTE — Patient Instructions (Signed)
Knee to Chest    Lying supine, bend involved knee to chest __3_ times. Repeat with other leg. Use a sheet under your knee to help pull  Do _2__ times per day.  Copyright  VHI. All rights reserved.

## 2016-05-09 ENCOUNTER — Ambulatory Visit (INDEPENDENT_AMBULATORY_CARE_PROVIDER_SITE_OTHER): Payer: Medicare Other | Admitting: Physician Assistant

## 2016-05-10 ENCOUNTER — Ambulatory Visit: Payer: Medicare Other | Admitting: Physical Therapy

## 2016-05-10 ENCOUNTER — Telehealth: Payer: Self-pay | Admitting: Internal Medicine

## 2016-05-10 ENCOUNTER — Encounter: Payer: Medicare Other | Admitting: Physical Therapy

## 2016-05-10 NOTE — Telephone Encounter (Signed)
Spoke with patient and clarified that he had drunk the first half of his prep too early (today instead of Thursday night).  I reviewed his instructions verbally and then told him I would put one bottle of prep up front with reprinted instructions for him to pick up.  He agreed and verified he still had one bottle left.

## 2016-05-12 ENCOUNTER — Encounter: Payer: Medicare Other | Admitting: Physical Therapy

## 2016-05-12 ENCOUNTER — Ambulatory Visit: Payer: Medicare Other | Admitting: Physical Therapy

## 2016-05-13 ENCOUNTER — Encounter: Payer: Medicare Other | Admitting: Internal Medicine

## 2016-05-16 ENCOUNTER — Ambulatory Visit (INDEPENDENT_AMBULATORY_CARE_PROVIDER_SITE_OTHER): Payer: Medicare Other | Admitting: Physician Assistant

## 2016-05-17 ENCOUNTER — Ambulatory Visit: Payer: Medicare Other | Admitting: Physical Therapy

## 2016-05-17 ENCOUNTER — Encounter: Payer: Medicare Other | Admitting: Physical Therapy

## 2016-05-19 ENCOUNTER — Ambulatory Visit: Payer: Medicare Other | Admitting: Physical Therapy

## 2016-05-19 ENCOUNTER — Encounter: Payer: Medicare Other | Admitting: Physical Therapy

## 2016-06-29 ENCOUNTER — Ambulatory Visit (AMBULATORY_SURGERY_CENTER): Payer: Medicare Other | Admitting: Internal Medicine

## 2016-06-29 ENCOUNTER — Encounter: Payer: Self-pay | Admitting: Internal Medicine

## 2016-06-29 VITALS — BP 118/73 | HR 74 | Temp 97.8°F | Resp 16 | Ht 64.0 in | Wt 134.0 lb

## 2016-06-29 DIAGNOSIS — Z86010 Personal history of colonic polyps: Secondary | ICD-10-CM

## 2016-06-29 DIAGNOSIS — D128 Benign neoplasm of rectum: Secondary | ICD-10-CM | POA: Diagnosis not present

## 2016-06-29 DIAGNOSIS — D122 Benign neoplasm of ascending colon: Secondary | ICD-10-CM | POA: Diagnosis not present

## 2016-06-29 MED ORDER — SODIUM CHLORIDE 0.9 % IV SOLN: 500.0000 mL | INTRAVENOUS | Status: DC

## 2016-06-29 NOTE — Progress Notes (Signed)
Called to room to assist during endoscopic procedure.  Patient ID and intended procedure confirmed with present staff. Received instructions for my participation in the procedure from the performing physician.  

## 2016-06-29 NOTE — Progress Notes (Signed)
Report to PACU, RN, vss, BBS= Clear.  

## 2016-06-29 NOTE — Op Note (Signed)
[name]: Jonathan [name] Procedure Date: 06/29/2016 2:59 PM MRN: [id number] Endoscopist: Docia Chuck. Henrene Pastor , MD Age: 56 Referring MD:  Date of Birth: 1960-06-05 Gender: Male Account #: [id number] Procedure:                Colonoscopy, with cold snare polypectomy X2 Indications:              High risk colon cancer surveillance: Personal                            history of adenoma (10 mm or greater in size), High                            risk colon cancer surveillance: Personal history of                            multiple (3 or more) adenomas. Index examination                            December 2014 Medicines:                Monitored Anesthesia Care Procedure:                Pre-Anesthesia Assessment:                           - Prior to the procedure, a History and Physical                            was performed, and patient medications and                            allergies were reviewed. The patient's tolerance of                            previous anesthesia was also reviewed. The risks                            and benefits of the procedure and the sedation                            options and risks were discussed with the patient.                            All questions were answered, and informed consent                            was obtained. Prior Anticoagulants: The patient has                            taken no previous anticoagulant or antiplatelet                            agents. ASA Grade Assessment: II - A patient with  mild systemic disease. After reviewing the risks                            and benefits, the patient was deemed in                            satisfactory condition to undergo the procedure.                           After obtaining informed consent, the colonoscope                            was passed under direct vision. Throughout the                            procedure, the  patient's blood pressure, pulse, and                            oxygen saturations were monitored continuously. The                            Colonoscope was introduced through the anus and                            advanced to the the cecum, identified by                            appendiceal orifice and ileocecal valve. The                            ileocecal valve, appendiceal orifice, and rectum                            were photographed. The quality of the bowel                            preparation was excellent. The colonoscopy was                            performed without difficulty. The patient tolerated                            the procedure well. The bowel preparation used was                            SUPREP. Scope In: 3:19:17 PM Scope Out: 3:31:47 PM Scope Withdrawal Time: 0 hours 10 minutes 8 seconds  Total Procedure Duration: 0 hours 12 minutes 30 seconds  Findings:                 Two polyps were found in the rectum and ascending                            colon. The polyps were 3 to 5 mm in size. These  polyps were removed with a cold snare. Resection                            and retrieval were complete.                           Multiple diverticula were found in the right colon.                           The exam was otherwise without abnormality on                            direct and retroflexion views. Complications:            No immediate complications. Estimated blood loss:                            None. Estimated Blood Loss:     Estimated blood loss: none. Impression:               - Two 3 to 5 mm polyps in the rectum and in the                            ascending colon, removed with a cold snare.                            Resected and retrieved.                           - Diverticulosis in the right colon.                           - The examination was otherwise normal on direct                            and  retroflexion views. Recommendation:           - Repeat colonoscopy in 5 years for surveillance.                           - Patient has a contact number available for                            emergencies. The signs and symptoms of potential                            delayed complications were discussed with the                            patient. Return to normal activities tomorrow.                            Written discharge instructions were provided to the                            patient.                           -  Resume previous diet.                           - Continue present medications.                           - Await pathology results. Docia Chuck. Henrene Pastor, MD 06/29/2016 3:36:02 PM This report has been signed electronically.

## 2016-06-29 NOTE — Patient Instructions (Signed)
Handouts given for: Diverticulosis and polyps   YOU HAD AN ENDOSCOPIC PROCEDURE TODAY: Refer to the procedure report and other information in the discharge instructions given to you for any specific questions about what was found during the examination. If this information does not answer your questions, please call [place] office at [phone] to clarify.   YOU SHOULD EXPECT: Some feelings of bloating in the abdomen. Passage of more gas than usual. Walking can help get rid of the air that was put into your GI tract during the procedure and reduce the bloating. If you had a lower endoscopy (such as a colonoscopy or flexible sigmoidoscopy) you may notice spotting of blood in your stool or on the toilet paper. Some abdominal soreness may be present for a day or two, also.  DIET: Your first meal following the procedure should be a light meal and then it is ok to progress to your normal diet. A half-sandwich or bowl of soup is an example of a good first meal. Heavy or fried foods are harder to digest and may make you feel nauseous or bloated. Drink plenty of fluids but you should avoid alcoholic beverages for 24 hours. If you had a esophageal dilation, please see attached instructions for diet.    ACTIVITY: Your care partner should take you home directly after the procedure. You should plan to take it easy, moving slowly for the rest of the day. You can resume normal activity the day after the procedure however YOU SHOULD NOT DRIVE, use power tools, machinery or perform tasks that involve climbing or major physical exertion for 24 hours (because of the sedation medicines used during the test).   SYMPTOMS TO REPORT IMMEDIATELY: A gastroenterologist can be reached at any hour. Please call [phone]  for any of the following symptoms:  Following lower endoscopy (colonoscopy, flexible sigmoidoscopy) Excessive amounts of blood in the stool  Significant tenderness, worsening of abdominal pains  Swelling  of the abdomen that is new, acute  Fever of 100 or higher   FOLLOW UP:  If any biopsies were taken you will be contacted by phone or by letter within the next 1-3 weeks. Call [phone]  if you have not heard about the biopsies in 3 weeks.  Please also call with any specific questions about appointments or follow up tests.

## 2016-06-30 ENCOUNTER — Telehealth: Payer: Self-pay | Admitting: *Deleted

## 2016-06-30 NOTE — Telephone Encounter (Signed)
  Follow up Call-  Call back number 06/29/2016  Post procedure Call Back phone  # [phone]  Permission to leave phone message Yes  Some recent data might be hidden     Patient questions:  Do you have a fever, pain , or abdominal swelling? No. Pain Score  0 *  Have you tolerated food without any problems? Yes.    Have you been able to return to your normal activities? Yes.    Do you have any questions about your discharge instructions: Diet   No. Medications  No. Follow up visit  No.  Do you have questions or concerns about your Care? No.  Actions: * If pain score is 4 or above: No action needed, pain <4.

## 2016-07-05 ENCOUNTER — Encounter: Payer: Self-pay | Admitting: Internal Medicine

## 2016-08-15 DIAGNOSIS — Z9181 History of falling: Secondary | ICD-10-CM | POA: Insufficient documentation

## 2016-08-23 DEATH — deceased

## 2016-09-05 DIAGNOSIS — K2960 Other gastritis without bleeding: Secondary | ICD-10-CM | POA: Insufficient documentation

## 2016-11-15 DIAGNOSIS — M2040 Other hammer toe(s) (acquired), unspecified foot: Secondary | ICD-10-CM | POA: Insufficient documentation

## 2016-11-15 DIAGNOSIS — M2142 Flat foot [pes planus] (acquired), left foot: Secondary | ICD-10-CM

## 2016-11-15 DIAGNOSIS — M2141 Flat foot [pes planus] (acquired), right foot: Secondary | ICD-10-CM | POA: Insufficient documentation

## 2017-01-27 DIAGNOSIS — R6889 Other general symptoms and signs: Secondary | ICD-10-CM | POA: Insufficient documentation

## 2017-02-15 DIAGNOSIS — N529 Male erectile dysfunction, unspecified: Secondary | ICD-10-CM | POA: Insufficient documentation

## 2017-02-15 DIAGNOSIS — R61 Generalized hyperhidrosis: Secondary | ICD-10-CM | POA: Insufficient documentation

## 2017-05-01 ENCOUNTER — Ambulatory Visit (INDEPENDENT_AMBULATORY_CARE_PROVIDER_SITE_OTHER): Payer: Medicare Other | Admitting: Orthopaedic Surgery

## 2017-05-03 ENCOUNTER — Encounter (INDEPENDENT_AMBULATORY_CARE_PROVIDER_SITE_OTHER): Payer: Self-pay | Admitting: Physician Assistant

## 2017-05-03 ENCOUNTER — Ambulatory Visit (INDEPENDENT_AMBULATORY_CARE_PROVIDER_SITE_OTHER): Payer: Medicare Other

## 2017-05-03 ENCOUNTER — Ambulatory Visit (INDEPENDENT_AMBULATORY_CARE_PROVIDER_SITE_OTHER): Payer: Medicare Other | Admitting: Physician Assistant

## 2017-05-03 ENCOUNTER — Ambulatory Visit (INDEPENDENT_AMBULATORY_CARE_PROVIDER_SITE_OTHER): Payer: Self-pay

## 2017-05-03 VITALS — Ht 62.0 in | Wt 176.0 lb

## 2017-05-03 DIAGNOSIS — M79671 Pain in right foot: Secondary | ICD-10-CM

## 2017-05-03 DIAGNOSIS — M79672 Pain in left foot: Secondary | ICD-10-CM

## 2017-05-03 DIAGNOSIS — M069 Rheumatoid arthritis, unspecified: Secondary | ICD-10-CM

## 2017-05-03 NOTE — Progress Notes (Signed)
Office Visit Note   Patient: Jonathan [name]           Date of Birth: 1961/01/10           MRN: [id number] Visit Date: 05/03/2017              Requested by: Sinclair Ship, [address] [place], [place] [address] PCP: Sinclair Ship, MD   Assessment & Plan: Visit Diagnoses:  1. Bilateral foot pain   2. Rheumatoid arthritis involving both ankles, unspecified rheumatoid factor presence ([place])   3. Rheumatoid arthritis of foot, unspecified laterality, unspecified rheumatoid factor presence ([place])     Plan: We will refer him to Dr. Sharol Given for consideration for possible surgical intervention due to his severe ankle and foot arthritic changes.  He may benefit from ankle fusion, subtalar fusion midfoot fusion and gastroc slide.  Questions are encouraged and answered at length today.  Referral was made to Dr. Sharol Given.  Follow-Up Instructions: Return for Dr. Sharol Given.   Orders:  Orders Placed This Encounter  Procedures  . XR Foot Complete Right  . XR Foot Complete Left   No orders of the defined types were placed in this encounter.     Procedures: No procedures performed   Clinical Data: No additional findings.   Subjective: Chief Complaint  Patient presents with  . Right Foot - Pain  . Left Foot - Pain    HPI Mr. Cromartie comes in today due to bilateral foot pain.  States pain is been ongoing for the past 3-4 months.  Notes swelling in both feet.  He notes some numbness in both feet also.  Has pain in the bottom of both feet and pain in his ankles.  Has difficulty donning shoes due to the swelling in his feet.  He notes he has severe pain whenever he first stands and begins to ambulate.  Said no known injury to either foot.  Does have rheumatoid arthritis and takes prednisone which does not help with his pain.  He was on methotrexate however he is discontinued that this recently.  He denies history of diabetes mellitus or gout.  Review of Systems Denies fevers,  chills, SOB. Positive for pain and swelling bilateral feet .   Objective: Vital Signs: Ht 5\' 2"  (1.575 m)   Wt 176 lb (79.8 kg)   BMI 32.19 kg/m   Physical Exam  Constitutional: He is oriented to person, place, and time. He appears well-developed and well-nourished. No distress.  Cardiovascular: Intact distal pulses.  Pulmonary/Chest: Effort normal.  Neurological: He is alert and oriented to person, place, and time.  Skin: He is not diaphoretic.  Psychiatric: He has a normal mood and affect. His behavior is normal.    Ortho Exam Bilateral feet positive for swelling.  No abnormal warmth erythema or impending ulcers.  No hypersensitivity to touch throughout the feet.  Has tenderness throughout the midfoot to palpation bilateral feet and also over the medial tubercle of the calcaneus and mid calcaneus plantar aspect bilaterally.  He has virtually no range of motion of either ankle and only is able to slightly move his foot through [place] joint.  Bilateral obvious hallux valgus deformities.  He is nontender at the 1st MTP joints bilaterally.  Specialty Comments:  No specialty comments available.  Imaging: Xr Foot Complete Left  Result Date: 05/03/2017 Left foot 3 views AP lateral and oblique, no acute fracture.  Significant arthritic changes of the ankle joint.  Subtalar joint severe arthritis.  Severe midfoot arthritic changes.  Hallux valgus deformity.Pes planus deformity  Xr Foot Complete Right  Result Date: 05/03/2017 Right foot AP lateral and oblique views: Severe arthritis of the ankle joint, midfoot and subtalar joint.  Cockup deformities of the lesser toes.  Hallux valgus deformity with lateralization of the sesamoids.  No acute fractures.Pes planus deformity    PMFS History: Patient Active Problem List   Diagnosis Date Noted  . Rheumatoid arthritis involving left hip ([place]) 01/29/2015  . 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 Status post total replacement of right hip 11/11/2014  . Gait difficulty 08/21/2013  . Benign neoplasm of colon 04/03/2013  . Reflux esophagitis 04/03/2013  . Microcytic anemia 04/01/2013  . Chronic ulcer of right leg ([place]) 01/13/2013  . Pressure ulcer 01/13/2013  . 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 Degenerative arthritis of knee 06/07/2011   Past Medical History:  Diagnosis Date  . Anemia    H/o using iron in the past   . Arthritis    rheumatoid  . Chronic back pain   . Hyperlipidemia   . Joint pain   . Joint swelling   . Rheumatoid arthritis(714.0)     Family History  Problem Relation Age of Onset  . Cancer Mother        ?  Marland Kitchen Hypertension Mother   . Heart failure Father   . Cancer Brother   . Anesthesia problems Neg Hx   . Colon cancer Neg Hx        mother had "3" types of CA- unsure about which one  . Esophageal cancer Neg Hx   . Rectal cancer Neg Hx   . Stomach cancer Neg Hx     Past Surgical History:  Procedure Laterality Date  . COLONOSCOPY N/A 04/03/2013   Procedure: COLONOSCOPY;  Surgeon: Irene Shipper, MD;  Location: [facility];  Service: Endoscopy;  Laterality: N/A;  . COLONOSCOPY    . ESOPHAGOGASTRODUODENOSCOPY N/A 04/03/2013   Procedure: ESOPHAGOGASTRODUODENOSCOPY (EGD);  Surgeon: Irene Shipper, MD;  Location: [facility];  Service: Endoscopy;  Laterality: N/A;  changed to moderate dr. Henrene Pastor did not want to go to the o.r. for an endo colon-jmt  . EYE SURGERY Left    "when i was a very small boy"  . JOINT REPLACEMENT     both knees  . KNEE ARTHROPLASTY  06/07/2011   Procedure: COMPUTER ASSISTED TOTAL KNEE ARTHROPLASTY;  Surgeon: Mcarthur Rossetti, MD;  Location: [facility];  Service: Orthopedics;  Laterality: Right;  Right total knee arthoplasty  . KNEE CLOSED REDUCTION  07/19/2011   Procedure: CLOSED MANIPULATION KNEE;  Surgeon: Mcarthur Rossetti, MD;  Location: [facility];  Service: Orthopedics;  Laterality: Right;   Manipulation under anesthesia right knee  . TOTAL HIP ARTHROPLASTY Right 11/11/2014   Procedure: RIGHT TOTAL HIP ARTHROPLASTY ANTERIOR APPROACH;  Surgeon: Mcarthur Rossetti, MD;  Location: [facility];  Service: Orthopedics;  Laterality: Right;  . TOTAL HIP ARTHROPLASTY Left 01/29/2015   Procedure: LEFT TOTAL HIP ARTHROPLASTY ANTERIOR APPROACH;  Surgeon: Mcarthur Rossetti, MD;  Location: [facility];  Service: Orthopedics;  Laterality: Left;   Social History   Occupational History  . Occupation: Disabled  Tobacco Use  . Smoking status: Former Smoker    Types: Cigarettes    Last attempt to quit: 04/25/2004    Years since quitting: 13.0  . Smokeless tobacco: Never Used  Substance and Sexual  Activity  . Alcohol use: No  . Drug use: No  . Sexual activity: No

## 2017-05-09 ENCOUNTER — Encounter (INDEPENDENT_AMBULATORY_CARE_PROVIDER_SITE_OTHER): Payer: Self-pay | Admitting: Orthopedic Surgery

## 2017-05-09 ENCOUNTER — Ambulatory Visit (INDEPENDENT_AMBULATORY_CARE_PROVIDER_SITE_OTHER): Payer: Medicare Other | Admitting: Orthopedic Surgery

## 2017-05-09 DIAGNOSIS — M19071 Primary osteoarthritis, right ankle and foot: Secondary | ICD-10-CM

## 2017-05-09 DIAGNOSIS — M19072 Primary osteoarthritis, left ankle and foot: Secondary | ICD-10-CM

## 2017-05-09 DIAGNOSIS — M19171 Post-traumatic osteoarthritis, right ankle and foot: Secondary | ICD-10-CM | POA: Insufficient documentation

## 2017-05-09 NOTE — Progress Notes (Signed)
Office Visit Note   Patient: Jonathan [name]           Date of Birth: 1961-02-09           MRN: [id number] Visit Date: 05/09/2017              Requested by: Sinclair Ship, MD [address] [place], [place] [address] PCP: Sinclair Ship, MD  No chief complaint on file.     HPI: Patient is a 57 year old gentleman with severe arthritis involving upper extremities and lower extremities.  Patient is status post bilateral total hip arthroplasties he complains of pain in the ankle and foot bilaterally.  Assessment & Plan: Visit Diagnoses:  1. Primary osteoarthritis, left ankle and foot   2. Primary osteoarthritis, right ankle and foot     Plan: Patient is given a prescription for [facility] for extra-depth shoes custom orthotics double upright braces.  With patient's extensive arthritis involving the foot and ankle I do not feel that doing a fusion pantalar as well as through the foot would relieve any of his symptoms.  I feel that conservative therapy with shoes and braces is his best option.  Follow-Up Instructions: Return if symptoms worsen or fail to improve.   Ortho Exam  Patient is alert, oriented, no adenopathy, well-dressed, normal affect, normal respiratory effort. Examination patient has difficulty getting from a sitting to a standing position.  He has deformed arthritic changes of both hands.  He is status post bilateral total hip arthroplasties he has an antalgic gait.  He has less than 10 degrees range of motion both ankles.  He has no subtalar motion bilaterally and essentially no motion through the midfoot.  He has good pulses bilaterally he does have some venous stasis swelling.  His radiographs were reviewed which shows massive destructive arthritis throughout the foot and ankle and subtalar joint.  Imaging: No results found. No images are attached to the encounter.  Labs: No results found for: HGBA1C, ESRSEDRATE, CRP, LABURIC, REPTSTATUS, GRAMSTAIN, CULT,  LABORGA  @LABSALLVALUES (HGBA1)@  There is no height or weight on file to calculate BMI.  Orders:  No orders of the defined types were placed in this encounter.  No orders of the defined types were placed in this encounter.    Procedures: No procedures performed  Clinical Data: No additional findings.  ROS:  All other systems negative, except as noted in the HPI. Review of Systems  Objective: Vital Signs: There were no vitals taken for this visit.  Specialty Comments:  No specialty comments available.  PMFS History: Patient Active Problem List   Diagnosis Date Noted  . Primary osteoarthritis, left ankle and foot 05/09/2017  . Post-traumatic osteoarthritis, right ankle and foot 05/09/2017  . Primary osteoarthritis, right ankle and foot 05/09/2017  . Rheumatoid arthritis involving left hip ([place]) 01/29/2015  . 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 Status post total replacement of right hip 11/11/2014  . Gait difficulty 08/21/2013  . Benign neoplasm of colon 04/03/2013  . Reflux esophagitis 04/03/2013  . Microcytic anemia 04/01/2013  . Chronic ulcer of right leg ([place]) 01/13/2013  . Pressure ulcer 01/13/2013  . 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 Degenerative arthritis of knee 06/07/2011   Past Medical History:  Diagnosis Date  . Anemia    H/o using iron in the past   . Arthritis    rheumatoid  . Chronic back pain   .  Hyperlipidemia   . Joint pain   . Joint swelling   . Rheumatoid arthritis(714.0)     Family History  Problem Relation Age of Onset  . Cancer Mother        ?  Marland Kitchen Hypertension Mother   . Heart failure Father   . Cancer Brother   . Anesthesia problems Neg Hx   . Colon cancer Neg Hx        mother had "3" types of CA- unsure about which one  . Esophageal cancer Neg Hx   . Rectal cancer Neg Hx   . Stomach cancer Neg Hx     Past Surgical History:  Procedure  Laterality Date  . COLONOSCOPY N/A 04/03/2013   Procedure: COLONOSCOPY;  Surgeon: Irene Shipper, MD;  [facility];  Service: Endoscopy;  Laterality: N/A;  . COLONOSCOPY    . ESOPHAGOGASTRODUODENOSCOPY N/A 04/03/2013   Procedure: ESOPHAGOGASTRODUODENOSCOPY (EGD);  Surgeon: Irene Shipper, MD;  Location: [facility];  Service: Endoscopy;  Laterality: N/A;  changed to moderate dr. Henrene Pastor did not want to go to the o.r. for an endo colon-jmt  . EYE SURGERY Left    "when i was a very small boy"  . JOINT REPLACEMENT     both knees  . KNEE ARTHROPLASTY  06/07/2011   Procedure: COMPUTER ASSISTED TOTAL KNEE ARTHROPLASTY;  Surgeon: Mcarthur Rossetti, MD;  Location: [facility];  Service: Orthopedics;  Laterality: Right;  Right total knee arthoplasty  . KNEE CLOSED REDUCTION  07/19/2011   Procedure: CLOSED MANIPULATION KNEE;  Surgeon: Mcarthur Rossetti, MD;  Location: [facility];  Service: Orthopedics;  Laterality: Right;  Manipulation under anesthesia right knee  . TOTAL HIP ARTHROPLASTY Right 11/11/2014   Procedure: RIGHT TOTAL HIP ARTHROPLASTY ANTERIOR APPROACH;  Surgeon: Mcarthur Rossetti, MD;  Location: [facility];  Service: Orthopedics;  Laterality: Right;  . TOTAL HIP ARTHROPLASTY Left 01/29/2015   Procedure: LEFT TOTAL HIP ARTHROPLASTY ANTERIOR APPROACH;  Surgeon: Mcarthur Rossetti, MD;  Location: [facility];  Service: Orthopedics;  Laterality: Left;   Social History   Occupational History  . Occupation: Disabled  Tobacco Use  . Smoking status: Former Smoker    Types: Cigarettes    Last attempt to quit: 04/25/2004    Years since quitting: 13.0  . Smokeless tobacco: Never Used  Substance and Sexual Activity  . Alcohol use: No  . Drug use: No  . Sexual activity: No

## 2017-06-02 ENCOUNTER — Telehealth (INDEPENDENT_AMBULATORY_CARE_PROVIDER_SITE_OTHER): Payer: Self-pay | Admitting: Orthopedic Surgery

## 2017-06-02 NOTE — Telephone Encounter (Signed)
Records 04/2017- present faxed to [facility]

## 2017-06-08 DIAGNOSIS — R600 Localized edema: Secondary | ICD-10-CM | POA: Insufficient documentation

## 2017-06-26 ENCOUNTER — Telehealth (INDEPENDENT_AMBULATORY_CARE_PROVIDER_SITE_OTHER): Payer: Self-pay | Admitting: Orthopedic Surgery

## 2017-06-26 NOTE — Telephone Encounter (Signed)
Patient called asked if Dr. Sharol Given can prescribe him an antibiotic for his feet when he come to his appointment 06/29/17. The number to contact patient is [phone]

## 2017-06-26 NOTE — Telephone Encounter (Signed)
Have called this pt. X 4 at the contact number left in the message.

## 2017-06-27 NOTE — Telephone Encounter (Signed)
Phone is still busy.  Tried other number on file - has been disconnected.

## 2017-06-27 NOTE — Telephone Encounter (Signed)
Number is still busy. Will continue to try later.

## 2017-06-28 NOTE — Telephone Encounter (Signed)
Tried pt one more time -- still get a busy signal.  He has appt with Dr. Sharol Given tomorrow, at which time this issue will be addressed.

## 2017-06-29 ENCOUNTER — Encounter (INDEPENDENT_AMBULATORY_CARE_PROVIDER_SITE_OTHER): Payer: Self-pay | Admitting: Orthopedic Surgery

## 2017-06-29 ENCOUNTER — Ambulatory Visit (INDEPENDENT_AMBULATORY_CARE_PROVIDER_SITE_OTHER): Payer: Medicare Other | Admitting: Orthopedic Surgery

## 2017-06-29 VITALS — Ht 62.0 in | Wt 176.0 lb

## 2017-06-29 DIAGNOSIS — M19072 Primary osteoarthritis, left ankle and foot: Secondary | ICD-10-CM | POA: Diagnosis not present

## 2017-06-29 DIAGNOSIS — M19071 Primary osteoarthritis, right ankle and foot: Secondary | ICD-10-CM | POA: Diagnosis not present

## 2017-06-29 NOTE — Progress Notes (Signed)
Office Visit Note   Patient: Jonathan [name]           Date of Birth: 1960/05/31           MRN: [id number] Visit Date: 06/29/2017              Requested by: Sinclair Ship, [address] [place], [place] [address] PCP: Sinclair Ship, MD  Chief Complaint  Patient presents with  . Right Ankle - Pain, Edema, Follow-up  . Left Ankle - Pain, Edema, Follow-up  . Right Foot - Pain, Edema, Follow-up  . Left Foot - Pain, Edema, Follow-up      HPI: Patient is a 57 year old gentleman who presents in follow-up for severe osteoarthritis of both ankles patient has had double upright braces extra-depth shoes fabricated and patient states this is making his ambulation much better but he states that the braces squeak.  Assessment & Plan: Visit Diagnoses:  1. Primary osteoarthritis, left ankle and foot   2. Primary osteoarthritis, right ankle and foot     Plan: Patient will follow-up with [facility] for modification of the braces we will see him back in 3 months to see if we need to make further adjustments.  Follow-Up Instructions: Return in about 3 months (around 09/29/2017).   Ortho Exam  Patient is alert, oriented, no adenopathy, well-dressed, normal affect, normal respiratory effort. Examination patient has difficulty getting from a sitting to a standing position.  He can ambulate well with both braces he is not using any assistive devices he has no ulcers or skin breakdown.  Imaging: No results found. No images are attached to the encounter.  Labs: No results found for: HGBA1C, ESRSEDRATE, CRP, LABURIC, REPTSTATUS, GRAMSTAIN, CULT, LABORGA  @LABSALLVALUES (HGBA1)@  Body mass index is 32.19 kg/m.  Orders:  No orders of the defined types were placed in this encounter.  No orders of the defined types were placed in this encounter.    Procedures: No procedures performed  Clinical Data: No additional findings.  ROS:  All other systems negative, except as  noted in the HPI. Review of Systems  Objective: Vital Signs: Ht 5\' 2"  (1.575 m)   Wt 176 lb (79.8 kg)   BMI 32.19 kg/m   Specialty Comments:  No specialty comments available.  PMFS History: Patient Active Problem List   Diagnosis Date Noted  . Primary osteoarthritis, left ankle and foot 05/09/2017  . Post-traumatic osteoarthritis, right ankle and foot 05/09/2017  . Primary osteoarthritis, right ankle and foot 05/09/2017  . Rheumatoid arthritis involving left hip ([place]) 01/29/2015  . 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 Status post total replacement of right hip 11/11/2014  . Gait difficulty 08/21/2013  . Benign neoplasm of colon 04/03/2013  . Reflux esophagitis 04/03/2013  . Microcytic anemia 04/01/2013  . Chronic ulcer of right leg ([place]) 01/13/2013  . Pressure ulcer 01/13/2013  . 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 Degenerative arthritis of knee 06/07/2011   Past Medical History:  Diagnosis Date  . Anemia    H/o using iron in the past   . Arthritis    rheumatoid  . Chronic back pain   . Hyperlipidemia   . Joint pain   . Joint swelling   . Rheumatoid arthritis(714.0)     Family History  Problem Relation Age of Onset  . Cancer Mother        ?  Marland Kitchen Hypertension Mother   .  Heart failure Father   . Cancer Brother   . Anesthesia problems Neg Hx   . Colon cancer Neg Hx        mother had "3" types of CA- unsure about which one  . Esophageal cancer Neg Hx   . Rectal cancer Neg Hx   . Stomach cancer Neg Hx     Past Surgical History:  Procedure Laterality Date  . COLONOSCOPY N/A 04/03/2013   Procedure: COLONOSCOPY;  Surgeon: Irene Shipper, MD;  [facility];  Service: Endoscopy;  Laterality: N/A;  . COLONOSCOPY    . ESOPHAGOGASTRODUODENOSCOPY N/A 04/03/2013   Procedure: ESOPHAGOGASTRODUODENOSCOPY (EGD);  Surgeon: Irene Shipper, MD;  Location: [facility];  Service:  Endoscopy;  Laterality: N/A;  changed to moderate dr. Henrene Pastor did not want to go to the o.r. for an endo colon-jmt  . EYE SURGERY Left    "when i was a very small boy"  . JOINT REPLACEMENT     both knees  . KNEE ARTHROPLASTY  06/07/2011   Procedure: COMPUTER ASSISTED TOTAL KNEE ARTHROPLASTY;  Surgeon: Mcarthur Rossetti, MD;  Location: [facility];  Service: Orthopedics;  Laterality: Right;  Right total knee arthoplasty  . KNEE CLOSED REDUCTION  07/19/2011   Procedure: CLOSED MANIPULATION KNEE;  Surgeon: Mcarthur Rossetti, MD;  Location: [facility];  Service: Orthopedics;  Laterality: Right;  Manipulation under anesthesia right knee  . TOTAL HIP ARTHROPLASTY Right 11/11/2014   Procedure: RIGHT TOTAL HIP ARTHROPLASTY ANTERIOR APPROACH;  Surgeon: Mcarthur Rossetti, MD;  Location: [facility];  Service: Orthopedics;  Laterality: Right;  . TOTAL HIP ARTHROPLASTY Left 01/29/2015   Procedure: LEFT TOTAL HIP ARTHROPLASTY ANTERIOR APPROACH;  Surgeon: Mcarthur Rossetti, MD;  Location: [facility];  Service: Orthopedics;  Laterality: Left;   Social History   Occupational History  . Occupation: Disabled  Tobacco Use  . Smoking status: Former Smoker    Types: Cigarettes    Last attempt to quit: 04/25/2004    Years since quitting: 13.1  . Smokeless tobacco: Never Used  Substance and Sexual Activity  . Alcohol use: No  . Drug use: No  . Sexual activity: No

## 2017-07-10 DIAGNOSIS — R748 Abnormal levels of other serum enzymes: Secondary | ICD-10-CM | POA: Insufficient documentation

## 2017-09-21 DIAGNOSIS — M25562 Pain in left knee: Secondary | ICD-10-CM

## 2017-09-21 DIAGNOSIS — M79672 Pain in left foot: Secondary | ICD-10-CM

## 2017-09-21 DIAGNOSIS — G8929 Other chronic pain: Secondary | ICD-10-CM | POA: Insufficient documentation

## 2017-09-21 DIAGNOSIS — R2689 Other abnormalities of gait and mobility: Secondary | ICD-10-CM | POA: Insufficient documentation

## 2017-09-21 DIAGNOSIS — M25561 Pain in right knee: Secondary | ICD-10-CM

## 2017-09-21 DIAGNOSIS — M79671 Pain in right foot: Secondary | ICD-10-CM | POA: Insufficient documentation

## 2017-10-02 ENCOUNTER — Ambulatory Visit (INDEPENDENT_AMBULATORY_CARE_PROVIDER_SITE_OTHER): Payer: Medicare Other | Admitting: Orthopedic Surgery

## 2017-10-25 ENCOUNTER — Ambulatory Visit (INDEPENDENT_AMBULATORY_CARE_PROVIDER_SITE_OTHER): Payer: Medicare Other | Admitting: Orthopedic Surgery

## 2017-11-14 ENCOUNTER — Ambulatory Visit (INDEPENDENT_AMBULATORY_CARE_PROVIDER_SITE_OTHER): Payer: Medicare Other | Admitting: Orthopedic Surgery

## 2018-02-23 DIAGNOSIS — B360 Pityriasis versicolor: Secondary | ICD-10-CM | POA: Insufficient documentation

## 2018-02-23 DIAGNOSIS — I10 Essential (primary) hypertension: Secondary | ICD-10-CM | POA: Insufficient documentation

## 2018-02-23 DIAGNOSIS — G4700 Insomnia, unspecified: Secondary | ICD-10-CM | POA: Insufficient documentation

## 2018-07-16 DIAGNOSIS — J302 Other seasonal allergic rhinitis: Secondary | ICD-10-CM | POA: Insufficient documentation

## 2018-08-22 ENCOUNTER — Other Ambulatory Visit: Payer: Self-pay | Admitting: Podiatry

## 2018-08-22 ENCOUNTER — Other Ambulatory Visit: Payer: Self-pay

## 2018-08-22 ENCOUNTER — Ambulatory Visit (INDEPENDENT_AMBULATORY_CARE_PROVIDER_SITE_OTHER): Payer: Medicare Other

## 2018-08-22 ENCOUNTER — Ambulatory Visit (INDEPENDENT_AMBULATORY_CARE_PROVIDER_SITE_OTHER): Payer: Medicare Other | Admitting: Podiatry

## 2018-08-22 DIAGNOSIS — I872 Venous insufficiency (chronic) (peripheral): Secondary | ICD-10-CM | POA: Insufficient documentation

## 2018-08-22 DIAGNOSIS — M79671 Pain in right foot: Secondary | ICD-10-CM

## 2018-08-22 DIAGNOSIS — M0579 Rheumatoid arthritis with rheumatoid factor of multiple sites without organ or systems involvement: Secondary | ICD-10-CM | POA: Insufficient documentation

## 2018-08-22 DIAGNOSIS — E785 Hyperlipidemia, unspecified: Secondary | ICD-10-CM | POA: Insufficient documentation

## 2018-08-22 DIAGNOSIS — E559 Vitamin D deficiency, unspecified: Secondary | ICD-10-CM | POA: Insufficient documentation

## 2018-08-22 DIAGNOSIS — R911 Solitary pulmonary nodule: Secondary | ICD-10-CM | POA: Insufficient documentation

## 2018-08-22 DIAGNOSIS — M2041 Other hammer toe(s) (acquired), right foot: Secondary | ICD-10-CM

## 2018-08-22 DIAGNOSIS — M19072 Primary osteoarthritis, left ankle and foot: Secondary | ICD-10-CM | POA: Diagnosis not present

## 2018-08-22 DIAGNOSIS — M069 Rheumatoid arthritis, unspecified: Secondary | ICD-10-CM

## 2018-08-22 DIAGNOSIS — M19071 Primary osteoarthritis, right ankle and foot: Secondary | ICD-10-CM

## 2018-08-22 DIAGNOSIS — M1990 Unspecified osteoarthritis, unspecified site: Secondary | ICD-10-CM | POA: Insufficient documentation

## 2018-08-22 DIAGNOSIS — M2042 Other hammer toe(s) (acquired), left foot: Secondary | ICD-10-CM

## 2018-08-22 DIAGNOSIS — M21619 Bunion of unspecified foot: Secondary | ICD-10-CM

## 2018-08-22 DIAGNOSIS — M2010 Hallux valgus (acquired), unspecified foot: Secondary | ICD-10-CM

## 2018-08-22 DIAGNOSIS — N281 Cyst of kidney, acquired: Secondary | ICD-10-CM | POA: Insufficient documentation

## 2018-08-22 DIAGNOSIS — M79672 Pain in left foot: Secondary | ICD-10-CM

## 2018-08-22 NOTE — Progress Notes (Signed)
HPI: 58 year old male h/o RA presents to the office today for bilateral foot pain.  The pain is mostly in the bilateral forefoot.  He has seen different physicians in the past who have tried conservative treatment including injections and shoe gear modifications.  The patient has had no significant improvement of symptoms.  The patient is currently being treated and managed by rheumatology.  Despite conservative treatment of his feet he cannot wear shoes without pain.  He presents for further treatment and evaluation  Past Medical History:  Diagnosis Date  . Anemia    H/o using iron in the past   . Arthritis    rheumatoid  . Chronic back pain   . Hyperlipidemia   . Joint pain   . Joint swelling   . Rheumatoid arthritis(714.0)      Physical Exam: General: The patient is alert and oriented x3 in no acute distress.  Dermatology: Skin is warm, dry and supple bilateral lower extremities. Negative for open lesions or macerations.  Vascular: Palpable pedal pulses bilaterally.  There does appear to be some chronic mild edema to the bilateral lower extremities.  Capillary refill within normal limits.  Neurological: Epicritic and protective threshold grossly intact bilaterally.   Musculoskeletal Exam: Range of motion within normal limits to all pedal and ankle joints bilateral. Muscle strength 5/5 in all groups bilateral.  Limited range of motion to the MTPJ's 1-5 bilateral.  There is also some pain on palpation to the bilateral forefoot.  Clinical evidence of bunion deformity noted.  Hammertoe contracture also noted 1-5 bilateral  Radiographic Exam:  Normal osseous mineralization.  Degenerative changes of the MTPJ's 1-5 bilateral with joint space narrowing and erosion around the metatarsal heads and bases of the proximal phalanx consistent with rheumatoid arthritis.  Lateral deviation of the toes 1-5 at the MTPJ bilateral.  Diffuse joint space narrowing noted throughout the midtarsal joints  bilateral.  Increased intermetatarsal angle with increased hallux abductus angle noted bilateral.  Increased intermetatarsal angle of the fourth intermetatarsal space consistent with tailor's bunion also noted.   Assessment: 1.  DJD/rheumatoid arthritis bilateral foot 2.  Bunion deformity bilateral 3.  Hammertoes 2-5 bilateral   Plan of Care:  1. Patient evaluated. X-Rays reviewed.  2.  Patient has tried multiple conservative modalities without any significant improvement of his symptoms.  I do believe the patient would benefit from surgical intervention at this time. 3.  All possible complications and details the procedure were explained.  No guarantees were expressed or implied.  All patient questions were answered.  Patient understands that this is more of a salvage type procedure to alleviate the patient's pain and symptoms. 4.  Authorization for surgery was initiated today.  Surgery will consist of first MPJ arthrodesis.  Metatarsal head resections 2-4.  PIPJ arthrodesis 2-4.  PIPJ arthroplasty with derotational skin plasty fifth digit.  Tailor's bunionectomy with fifth metatarsal osteotomy.  All procedures performed on the right foot. 5.  Continue management with rheumatology  6.  Return to clinic 1 week postop      Edrick Kins, DPM [facility]  Dr. Edrick Kins, DPM    [address], [place] [address]  Office [phone]  Fax [phone]

## 2018-08-22 NOTE — Patient Instructions (Signed)
Pre-Operative Instructions  Congratulations, you have decided to take an important step towards improving your quality of life.  You can be assured that the doctors and staff at [facility] will be with you every step of the way.  Here are some important things you should know:  1. Plan to be at the surgery center/hospital at least 1 (one) hour prior to your scheduled time, unless otherwise directed by the surgical center/hospital staff.  You must have a responsible adult accompany you, remain during the surgery and drive you home.  Make sure you have directions to the surgical center/hospital to ensure you arrive on time. 2. If you are having surgery at [facility] or [facility], you will need a copy of your medical history and physical form from your family physician within one month prior to the date of surgery. We will give you a form for your primary physician to complete.  3. We make every effort to accommodate the date you request for surgery.  However, there are times where surgery dates or times have to be moved.  We will contact you as soon as possible if a change in schedule is required.   4. No aspirin/ibuprofen for one week before surgery.  If you are on aspirin, any non-steroidal anti-inflammatory medications (Mobic, Aleve, Ibuprofen) should not be taken seven (7) days prior to your surgery.  You make take Tylenol for pain prior to surgery.  5. Medications - If you are taking daily heart and blood pressure medications, seizure, reflux, allergy, asthma, anxiety, pain or diabetes medications, make sure you notify the surgery center/hospital before the day of surgery so they can tell you which medications you should take or avoid the day of surgery. 6. No food or drink after midnight the night before surgery unless directed otherwise by surgical center/hospital staff. 7. No alcoholic beverages 24-hours prior to surgery.  No smoking 24-hours prior or 24-hours after  surgery. 8. Wear loose pants or shorts. They should be loose enough to fit over bandages, boots, and casts. 9. Don't wear slip-on shoes. Sneakers are preferred. 10. Bring your boot with you to the surgery center/hospital.  Also bring crutches or a walker if your physician has prescribed it for you.  If you do not have this equipment, it will be provided for you after surgery. 11. If you have not been contacted by the surgery center/hospital by the day before your surgery, call to confirm the date and time of your surgery. 12. Leave-time from work may vary depending on the type of surgery you have.  Appropriate arrangements should be made prior to surgery with your employer. 13. Prescriptions will be provided immediately following surgery by your doctor.  Fill these as soon as possible after surgery and take the medication as directed. Pain medications will not be refilled on weekends and must be approved by the doctor. 14. Remove nail polish on the operative foot and avoid getting pedicures prior to surgery. 15. Wash the night before surgery.  The night before surgery wash the foot and leg well with water and the antibacterial soap provided. Be sure to pay special attention to beneath the toenails and in between the toes.  Wash for at least three (3) minutes. Rinse thoroughly with water and dry well with a towel.  Perform this wash unless told not to do so by your physician.  Enclosed: 1 Ice pack (please put in freezer the night before surgery)   1 Hibiclens skin cleaner     Pre-op instructions  If you have any questions regarding the instructions, please do not hesitate to call our office.  [place]: [address], [place], [place] [address] -- [phone]  [place]: [address] [place], [place] [address] -- [phone]  [place]: [address]  [place], [place] [address] -- [phone]  [place]: [address], [address], [place], [place] [address] -- [phone]  Website:  [email or web address] 

## 2018-08-23 ENCOUNTER — Other Ambulatory Visit: Payer: Self-pay | Admitting: *Deleted

## 2018-08-23 DIAGNOSIS — M2040 Other hammer toe(s) (acquired), unspecified foot: Secondary | ICD-10-CM

## 2018-08-23 DIAGNOSIS — M05771 Rheumatoid arthritis with rheumatoid factor of right ankle and foot without organ or systems involvement: Secondary | ICD-10-CM

## 2018-08-27 ENCOUNTER — Telehealth: Payer: Self-pay | Admitting: *Deleted

## 2018-08-27 NOTE — Telephone Encounter (Signed)
"  I'm scheduled to have surgery on June 25.  Will I have any pins or anything in my foot?"  Yes, you will have some screws in your foot.  "Okay, I think he said I would need to be out of work for six weeks.  Is that right?"  Yes, you will be out of work for six weeks.  "Okay, that's not bad.  Thank you so much."

## 2018-10-12 ENCOUNTER — Telehealth: Payer: Self-pay | Admitting: *Deleted

## 2018-10-12 NOTE — Telephone Encounter (Signed)
"  I'm calling because I have a question about my surgery, scheduled for next Thursday.  Would you please call me back?  My question is, will you schedule me an appointment to come and register in for the surgery?  My next question is, Can I stay in the hospital a couple of days because I have two little cousins here.  One is two years old and the other one is 58 years old.  I don't want them stepping on my feet after the surgery.  If you can please give me a call back."

## 2018-10-15 NOTE — Telephone Encounter (Signed)
"  I'm calling about my surgery."  Okay, what's your question?  "What am I supposed to do and when am I supposed to be there?"  You called the doctor's office.  Someone from the surgical center will call you a day or two prior to your surgery date and will give you your arrival time.  You will not have a pre-admission testing appointment in advance.  Everything will be taken care the day of your surgery date.  "Okay, thank you."

## 2018-10-16 ENCOUNTER — Telehealth: Payer: Self-pay | Admitting: *Deleted

## 2018-10-16 NOTE — Telephone Encounter (Signed)
"  The nurse called Jonathan [name].  He told her his left foot is bothering him more and that he wanted to do that one first."  He will need to schedule an appointment with Dr.Evans for another consultation.  I'll give him a call.  "Okay, let us know what he decides."  I received a call from the scheduler at the surgery center.  She said she want to have your surgery on the left foot now instead of the right foot because it's worse.  "Yes, that is right."  You need to schedule an appointment to see Dr. Amalia Hailey again prior to your surgery date.  You need to sign consent forms again and get x-rays of your left foot.  "Just leave everything as it is now."  I'll call and let the surgical center know to leave it as is.  I called Caren Griffins, surgical center's scheduler, and informed her of his decision.

## 2018-10-18 ENCOUNTER — Other Ambulatory Visit: Payer: Self-pay | Admitting: Podiatry

## 2018-10-18 DIAGNOSIS — M21541 Acquired clubfoot, right foot: Secondary | ICD-10-CM | POA: Diagnosis not present

## 2018-10-18 DIAGNOSIS — M2041 Other hammer toe(s) (acquired), right foot: Secondary | ICD-10-CM | POA: Diagnosis not present

## 2018-10-18 DIAGNOSIS — M2011 Hallux valgus (acquired), right foot: Secondary | ICD-10-CM | POA: Diagnosis not present

## 2018-10-18 MED ORDER — CEPHALEXIN 500 MG PO CAPS: 500.0000 mg | ORAL_CAPSULE | Freq: Three times a day (TID) | ORAL | 0 refills | Status: DC

## 2018-10-18 MED ORDER — OXYCODONE-ACETAMINOPHEN 5-325 MG PO TABS: 1.0000 | ORAL_TABLET | Freq: Four times a day (QID) | ORAL | 0 refills | Status: DC | PRN

## 2018-10-18 NOTE — Progress Notes (Signed)
.  postop

## 2018-10-22 ENCOUNTER — Ambulatory Visit (INDEPENDENT_AMBULATORY_CARE_PROVIDER_SITE_OTHER): Payer: Medicare Other

## 2018-10-22 ENCOUNTER — Other Ambulatory Visit: Payer: Self-pay

## 2018-10-22 ENCOUNTER — Ambulatory Visit (INDEPENDENT_AMBULATORY_CARE_PROVIDER_SITE_OTHER): Payer: Medicare Other | Admitting: Podiatry

## 2018-10-22 DIAGNOSIS — M2042 Other hammer toe(s) (acquired), left foot: Secondary | ICD-10-CM

## 2018-10-22 DIAGNOSIS — M2041 Other hammer toe(s) (acquired), right foot: Secondary | ICD-10-CM | POA: Diagnosis not present

## 2018-10-22 DIAGNOSIS — Z9889 Other specified postprocedural states: Secondary | ICD-10-CM

## 2018-10-22 DIAGNOSIS — M21619 Bunion of unspecified foot: Secondary | ICD-10-CM

## 2018-10-22 DIAGNOSIS — M2040 Other hammer toe(s) (acquired), unspecified foot: Secondary | ICD-10-CM

## 2018-10-22 DIAGNOSIS — M2010 Hallux valgus (acquired), unspecified foot: Secondary | ICD-10-CM

## 2018-10-23 ENCOUNTER — Other Ambulatory Visit: Payer: Medicare Other

## 2018-10-24 NOTE — Progress Notes (Signed)
   Subjective:  Patient presents today status post right forefoot reconstruction surgery. DOS: 10/18/2018. He states he is doing well overall but is experiencing some moderate pain. He has been taking Percocet for pain with no significant relief. He has been nonweightbearing in the CAM boot as directed. Patient is here for further evaluation and treatment.    Past Medical History:  Diagnosis Date  . Anemia    H/o using iron in the past   . Arthritis    rheumatoid  . Chronic back pain   . Hyperlipidemia   . Joint pain   . Joint swelling   . Rheumatoid arthritis(714.0)       Objective/Physical Exam Neurovascular status intact.  Skin incisions appear to be well coapted with sutures and staples intact. No sign of infectious process noted. No dehiscence. No active bleeding noted. Moderate edema noted to the surgical extremity.  Radiographic Exam:  Orthopedic hardware and osteotomies sites appear to be stable with routine healing.  Assessment: 1. s/p right forefoot reconstruction surgery. DOS: 10/18/2018   Plan of Care:  1. Patient was evaluated. X-rays reviewed 2. Dressing changed. Keep clean, dry and intact for one week.  3. Continue nonweightbearing in CAM boot.  4. Continue taking oral Keflex 500 mg three times daily.  5. Continue taking Percocet 5/325 mg as needed for pain.  6. Return to clinic in one week.    Edrick Kins, DPM [facility]  Dr. Edrick Kins, [place]                                        [place], [place] [address]                Office [phone]  Fax [phone]

## 2018-10-29 ENCOUNTER — Encounter: Payer: Self-pay | Admitting: Podiatry

## 2018-10-29 ENCOUNTER — Ambulatory Visit (INDEPENDENT_AMBULATORY_CARE_PROVIDER_SITE_OTHER): Payer: Medicare Other | Admitting: Podiatry

## 2018-10-29 ENCOUNTER — Other Ambulatory Visit: Payer: Self-pay

## 2018-10-29 DIAGNOSIS — M21619 Bunion of unspecified foot: Secondary | ICD-10-CM

## 2018-10-29 DIAGNOSIS — M2010 Hallux valgus (acquired), unspecified foot: Secondary | ICD-10-CM

## 2018-10-29 DIAGNOSIS — M2040 Other hammer toe(s) (acquired), unspecified foot: Secondary | ICD-10-CM

## 2018-10-29 DIAGNOSIS — M2042 Other hammer toe(s) (acquired), left foot: Secondary | ICD-10-CM

## 2018-10-29 DIAGNOSIS — Z9889 Other specified postprocedural states: Secondary | ICD-10-CM

## 2018-10-29 DIAGNOSIS — M2041 Other hammer toe(s) (acquired), right foot: Secondary | ICD-10-CM

## 2018-10-29 MED ORDER — OXYCODONE-ACETAMINOPHEN 5-325 MG PO TABS: 1.0000 | ORAL_TABLET | Freq: Four times a day (QID) | ORAL | 0 refills | Status: DC | PRN

## 2018-10-29 MED ORDER — DOXYCYCLINE HYCLATE 100 MG PO TABS: 100.0000 mg | ORAL_TABLET | Freq: Two times a day (BID) | ORAL | 0 refills | Status: DC

## 2018-10-30 ENCOUNTER — Telehealth: Payer: Self-pay | Admitting: *Deleted

## 2018-10-30 NOTE — Telephone Encounter (Addendum)
Pt called states his prescriptions were not at the pharmacy. I called pt and asked for the pharmacy he would prefer. Pt states [facility] [address].

## 2018-10-31 ENCOUNTER — Telehealth: Payer: Self-pay | Admitting: *Deleted

## 2018-10-31 ENCOUNTER — Other Ambulatory Visit: Payer: Self-pay | Admitting: Podiatry

## 2018-10-31 MED ORDER — OXYCODONE-ACETAMINOPHEN 5-325 MG PO TABS: 1.0000 | ORAL_TABLET | Freq: Four times a day (QID) | ORAL | 0 refills | Status: DC | PRN

## 2018-10-31 MED ORDER — DOXYCYCLINE HYCLATE 100 MG PO TABS: 100.0000 mg | ORAL_TABLET | Freq: Two times a day (BID) | ORAL | 0 refills | Status: DC

## 2018-10-31 NOTE — Telephone Encounter (Signed)
I informed pt I had Dr. Amalia Hailey [facility] [address].

## 2018-10-31 NOTE — Telephone Encounter (Signed)
Pt called states his medication was not called to the pharmacy.

## 2018-10-31 NOTE — Progress Notes (Signed)
   Subjective:  Patient presents today status post right forefoot reconstruction surgery. DOS: 10/18/2018. He reports continued pain in the foot. He has been taking the Percocet as directed for treatment. He denies any modifying factors. Patient is here for further evaluation and treatment.    Past Medical History:  Diagnosis Date  . Anemia    H/o using iron in the past   . Arthritis    rheumatoid  . Chronic back pain   . Hyperlipidemia   . Joint pain   . Joint swelling   . Rheumatoid arthritis(714.0)       Objective/Physical Exam Neurovascular status intact.  Skin incisions appear to be well coapted with sutures and staples intact. No sign of infectious process noted. No dehiscence. No active bleeding noted. Maceration noted to the 4th toe of the right foot. Moderate edema noted to the surgical extremity.  Assessment: 1. s/p right forefoot reconstruction surgery. DOS: 10/18/2018   Plan of Care:  1. Patient was evaluated. 2. Dressing changed. Keep clean, dry and intact for one week.  3. Continue weightbearing in CAM boot.  4. Prescription for Doxycycline 100 mg BID for prophylaxis due to maceration.  5. Refill prescription for Percocet 5/325 mg provided to patient.  6. Return to clinic in one week.    Edrick Kins, DPM [facility]  Dr. Edrick Kins, [place]                                        [place], [place] [address]                Office [phone]  Fax [phone]

## 2018-11-05 ENCOUNTER — Ambulatory Visit (INDEPENDENT_AMBULATORY_CARE_PROVIDER_SITE_OTHER): Payer: Self-pay | Admitting: Podiatry

## 2018-11-05 ENCOUNTER — Other Ambulatory Visit: Payer: Self-pay

## 2018-11-05 DIAGNOSIS — Z9889 Other specified postprocedural states: Secondary | ICD-10-CM

## 2018-11-05 DIAGNOSIS — M2042 Other hammer toe(s) (acquired), left foot: Secondary | ICD-10-CM

## 2018-11-05 DIAGNOSIS — M2041 Other hammer toe(s) (acquired), right foot: Secondary | ICD-10-CM

## 2018-11-05 DIAGNOSIS — M2010 Hallux valgus (acquired), unspecified foot: Secondary | ICD-10-CM

## 2018-11-05 DIAGNOSIS — M2040 Other hammer toe(s) (acquired), unspecified foot: Secondary | ICD-10-CM

## 2018-11-05 DIAGNOSIS — M21619 Bunion of unspecified foot: Secondary | ICD-10-CM

## 2018-11-12 NOTE — Progress Notes (Signed)
   Subjective:  Patient presents today status post right forefoot reconstruction surgery. DOS: 10/18/2018.  Patient states that he is feeling much better this week.  The pain has improved over the past week.  He is left the dressings clean dry and intact and has done as instructed.  Past Medical History:  Diagnosis Date  . Anemia    H/o using iron in the past   . Arthritis    rheumatoid  . Chronic back pain   . Hyperlipidemia   . Joint pain   . Joint swelling   . Rheumatoid arthritis(714.0)       Objective/Physical Exam Neurovascular status intact.  Skin incisions appear to be well coapted with sutures and staples intact. No sign of infectious process noted. No dehiscence. No active bleeding noted.  Continued maceration noted to the 4th toe of the right foot.  There is some dusky discoloration to the fourth digit surgical foot concerning for ischemia.  Moderate edema noted to the surgical extremity.  Assessment: 1. s/p right forefoot reconstruction surgery. DOS: 10/18/2018 2.  Ischemic changes right fourth digit   Plan of Care:  1. Patient was evaluated.  Silver alginate dressing was applied 2. Dressing changed. Keep clean, dry and intact for one week.  3. Continue weightbearing in CAM boot.  4.  Continue oral doxycycline 100 mg twice daily 5.  The percutaneous fixation pin to the fourth digit was removed today.  Although this is not ideal to remove the pin early I do not want to impede any circulation to the toe. 6.  Return to clinic in 1 week   Edrick Kins, DPM [facility]  Dr. Edrick Kins, [place]                                        [place], [place] [address]                Office [phone]  Fax [phone]

## 2018-11-14 ENCOUNTER — Ambulatory Visit (INDEPENDENT_AMBULATORY_CARE_PROVIDER_SITE_OTHER): Payer: Medicare Other

## 2018-11-14 ENCOUNTER — Encounter: Payer: Self-pay | Admitting: Podiatry

## 2018-11-14 ENCOUNTER — Other Ambulatory Visit: Payer: Self-pay

## 2018-11-14 ENCOUNTER — Ambulatory Visit (INDEPENDENT_AMBULATORY_CARE_PROVIDER_SITE_OTHER): Payer: Self-pay | Admitting: Podiatry

## 2018-11-14 VITALS — Temp 98.2°F

## 2018-11-14 DIAGNOSIS — Z9889 Other specified postprocedural states: Secondary | ICD-10-CM

## 2018-11-14 DIAGNOSIS — M2042 Other hammer toe(s) (acquired), left foot: Secondary | ICD-10-CM

## 2018-11-14 DIAGNOSIS — M2041 Other hammer toe(s) (acquired), right foot: Secondary | ICD-10-CM

## 2018-11-14 DIAGNOSIS — M2040 Other hammer toe(s) (acquired), unspecified foot: Secondary | ICD-10-CM

## 2018-11-14 MED ORDER — OXYCODONE-ACETAMINOPHEN 5-325 MG PO TABS: 1.0000 | ORAL_TABLET | Freq: Four times a day (QID) | ORAL | 0 refills | Status: DC | PRN

## 2018-11-14 MED ORDER — CIPROFLOXACIN HCL 500 MG PO TABS: 500.0000 mg | ORAL_TABLET | Freq: Two times a day (BID) | ORAL | 0 refills | Status: DC

## 2018-11-14 MED ORDER — SULFAMETHOXAZOLE-TRIMETHOPRIM 800-160 MG PO TABS: 1.0000 | ORAL_TABLET | Freq: Two times a day (BID) | ORAL | 0 refills | Status: DC

## 2018-11-14 NOTE — Progress Notes (Signed)
   Subjective:  Patient presents today status post right forefoot reconstruction surgery. DOS: 10/18/2018.  Patient continues to have drainage with dehiscence and buildup of fibrotic tissue to a couple of the incisions to the foot.  He continues to have pain.  He is kept the dressings clean dry and intact as instructed.  Last visit on 11/05/2018 the percutaneous fixation pin was removed from the right fourth toe concerning for ischemia.  He presents for follow-up treatment and evaluation  Past Medical History:  Diagnosis Date  . Anemia    H/o using iron in the past   . Arthritis    rheumatoid  . Chronic back pain   . Hyperlipidemia   . Joint pain   . Joint swelling   . Rheumatoid arthritis(714.0)       Objective/Physical Exam Neurovascular status intact.  There continues to be maceration and breakdown of the fourth toe of the right foot.  Extensive fibrotic tissue noted.  Today upon evaluation the incision sites to the dorsal aspect of the foot do have some dehiscence with deep fibrotic tissue noted.  Moderate malodor noted.  Heavy drainage noted. Vascular status appears to be intact.  I am able to palpate the posterior tibial and dorsalis pedis arteries to the surgical extremity.  The skin is cool to touch however.  Assessment: 1. s/p right forefoot reconstruction surgery. DOS: 10/18/2018 2.  Ischemic changes right fourth digit   Plan of Care:  1. Patient was evaluated.  Betadine and dry sterile dressing was applied 2.  Cultures were taken from the drainage area of the fourth toe right foot 3.  Today we will place the patient on strong oral antibiotics.  Prescription for Bactrim DS #20 two times daily and prescription for Cipro 500 mg #20 two times daily 4.Orders placed for daily home health dressing changes. Betadine and DSD daily.  5. Refill Percocet 5/325mg  #30.  6. RTC in 2 days for dressing changes  Edrick Kins, DPM [facility]  Dr. Edrick Kins, [place]                                        [place], [place] [address]                Office [phone]  Fax [phone]

## 2018-11-16 ENCOUNTER — Other Ambulatory Visit: Payer: Self-pay

## 2018-11-16 ENCOUNTER — Telehealth: Payer: Self-pay

## 2018-11-16 ENCOUNTER — Ambulatory Visit: Payer: Medicare Other

## 2018-11-16 DIAGNOSIS — M2042 Other hammer toe(s) (acquired), left foot: Secondary | ICD-10-CM

## 2018-11-16 DIAGNOSIS — M2040 Other hammer toe(s) (acquired), unspecified foot: Secondary | ICD-10-CM

## 2018-11-16 DIAGNOSIS — Z9889 Other specified postprocedural states: Secondary | ICD-10-CM

## 2018-11-16 DIAGNOSIS — M2041 Other hammer toe(s) (acquired), right foot: Secondary | ICD-10-CM

## 2018-11-16 NOTE — Telephone Encounter (Signed)
HH orders sent to [facility]:   Daily dressing changes x 4 weeks    **Cleanse with normal saline, apply betadine all over toes, dress with 4x4 gauze, kerlix or kling and 4" ace wrap.

## 2018-11-17 LAB — WOUND CULTURE
MICRO NUMBER:: 693570
SPECIMEN QUALITY:: ADEQUATE

## 2018-11-19 ENCOUNTER — Ambulatory Visit: Payer: Medicare Other

## 2018-11-19 ENCOUNTER — Other Ambulatory Visit: Payer: Self-pay

## 2018-11-19 DIAGNOSIS — M2040 Other hammer toe(s) (acquired), unspecified foot: Secondary | ICD-10-CM

## 2018-11-19 DIAGNOSIS — M2041 Other hammer toe(s) (acquired), right foot: Secondary | ICD-10-CM

## 2018-11-19 DIAGNOSIS — Z9889 Other specified postprocedural states: Secondary | ICD-10-CM

## 2018-11-20 ENCOUNTER — Telehealth: Payer: Self-pay | Admitting: *Deleted

## 2018-11-20 NOTE — Telephone Encounter (Signed)
I spoke with [facility] and informed it would be fine to add the alginate and to change wound care frequency to 3 x week and teach caregiver.

## 2018-11-20 NOTE — Telephone Encounter (Signed)
[facility] states pt was admitted to their care yesterday and would like to add alginate to the wound care the wound is macerated, and Medicare will not pay for everyday wound care and would like to change frequency to twice weekly or teach wound care to the caregiver.

## 2018-11-21 ENCOUNTER — Encounter: Payer: Self-pay | Admitting: *Deleted

## 2018-11-21 ENCOUNTER — Encounter: Payer: Self-pay | Admitting: Podiatry

## 2018-11-21 ENCOUNTER — Ambulatory Visit (INDEPENDENT_AMBULATORY_CARE_PROVIDER_SITE_OTHER): Payer: Medicare Other | Admitting: Podiatry

## 2018-11-21 ENCOUNTER — Telehealth: Payer: Self-pay | Admitting: *Deleted

## 2018-11-21 ENCOUNTER — Ambulatory Visit (INDEPENDENT_AMBULATORY_CARE_PROVIDER_SITE_OTHER): Payer: Medicare Other

## 2018-11-21 ENCOUNTER — Other Ambulatory Visit: Payer: Self-pay

## 2018-11-21 VITALS — Temp 98.2°F

## 2018-11-21 DIAGNOSIS — Z9889 Other specified postprocedural states: Secondary | ICD-10-CM

## 2018-11-21 DIAGNOSIS — M2041 Other hammer toe(s) (acquired), right foot: Secondary | ICD-10-CM

## 2018-11-21 DIAGNOSIS — M2042 Other hammer toe(s) (acquired), left foot: Secondary | ICD-10-CM

## 2018-11-21 DIAGNOSIS — M2040 Other hammer toe(s) (acquired), unspecified foot: Secondary | ICD-10-CM

## 2018-11-21 DIAGNOSIS — R0989 Other specified symptoms and signs involving the circulatory and respiratory systems: Secondary | ICD-10-CM

## 2018-11-21 NOTE — Telephone Encounter (Addendum)
Dr. March Rummage ordered wound care to right post op foot apply Aquacell packing to 2nd metatarsal 3 time a week beginning tomorrow, Acticoat or Silver non-adherent dressing over the 4th toe, and betadine over the other open wound, and xeroform over the pin sites. [facility] called [facility] [place] [facility] [place]. I gave Jenny Reichmann, Dr. Eleanora Neighbor orders, she states they order supplies specific to each pt and checked their supplies they do have Optacel Ag for the 2nd metatarsal packing and Optafoam to the right 4th toe, the clinician for the visit may have betadine and xeroform. I spoke with Gretta Arab, RN and she stated that would be fine until Aquacell packing and Acticoat or Silver non-adherent dressing are received. Faxed typed orders to [facility].

## 2018-11-21 NOTE — Progress Notes (Signed)
Subjective:  Patient ID: Jonathan [name], male    DOB: Jul 24, 1960,  MRN: [id number]  Chief Complaint  Patient presents with  . Routine Post Op    DOS 10/18/2018 METATARSAL OSTEOTOMY 5TH, OSTECTOMY COMP. MET. HEAD 2,3,4; HAMMER TOE REPAIR 2-5; HALLUX MPJ FUSION RT " my foot is doing ok"     DOS: 10/18/2018 Procedure: Forefoot reconstruction, Hoffman/Clayton type  58 y.o. male returns for post-op check. Doing ok, having some pain. Most concerned about his 4th toe. At last visit there was concern that it was looking ischemic. Denies other issues.  Review of Systems: Negative except as noted in the HPI. Denies N/V/F/Ch.  Past Medical History:  Diagnosis Date  . Anemia    H/o using iron in the past   . Arthritis    rheumatoid  . Chronic back pain   . Hyperlipidemia   . Joint pain   . Joint swelling   . Rheumatoid arthritis(714.0)     Current Outpatient Medications:  .  acetaminophen-codeine (TYLENOL #3) 300-30 MG tablet, Take by mouth., Disp: , Rfl:  .  amitriptyline (ELAVIL) 25 MG tablet, Take by mouth., Disp: , Rfl:  .  atorvastatin (LIPITOR) 40 MG tablet, Take 40 mg by mouth daily., Disp: , Rfl:  .  cephALEXin (KEFLEX) 500 MG capsule, Take 1 capsule (500 mg total) by mouth 3 (three) times daily., Disp: 30 capsule, Rfl: 0 .  cetirizine (ZYRTEC) 10 MG tablet, Take by mouth., Disp: , Rfl:  .  ciprofloxacin (CIPRO) 500 MG tablet, Take 1 tablet (500 mg total) by mouth 2 (two) times daily., Disp: 20 tablet, Rfl: 0 .  doxycycline (VIBRA-TABS) 100 MG tablet, Take 1 tablet (100 mg total) by mouth 2 (two) times daily., Disp: 20 tablet, Rfl: 0 .  fluticasone (FLONASE) 50 MCG/ACT nasal spray, Place into the nose., Disp: , Rfl:  .  gabapentin (NEURONTIN) 300 MG capsule, , Disp: , Rfl:  .  hydrochlorothiazide (HYDRODIURIL) 25 MG tablet, Take by mouth., Disp: , Rfl:  .  inFLIXimab (REMICADE) 100 MG injection, Inject 300 mg into the vein. Hasn't started yet, Disp: , Rfl:  .  ketoconazole  (NIZORAL) 2 % cream, Apply 1 inch twice daily to hand rash, Disp: , Rfl:  .  meloxicam (MOBIC) 7.5 MG tablet, Take by mouth., Disp: , Rfl:  .  methocarbamol (ROBAXIN) 500 MG tablet, Take 1 tablet (500 mg total) by mouth every 6 (six) hours as needed for muscle spasms., Disp: 60 tablet, Rfl: 0 .  methotrexate (RHEUMATREX) 2.5 MG tablet, , Disp: , Rfl:  .  montelukast (SINGULAIR) 10 MG tablet, Take by mouth., Disp: , Rfl:  .  oxyCODONE-acetaminophen (PERCOCET) 5-325 MG tablet, Take 1 tablet by mouth every 6 (six) hours as needed for severe pain., Disp: 30 tablet, Rfl: 0 .  predniSONE (DELTASONE) 10 MG tablet, Take 10 mg by mouth., Disp: , Rfl:  .  sulfamethoxazole-trimethoprim (BACTRIM DS) 800-160 MG tablet, Take 1 tablet by mouth 2 (two) times daily., Disp: 20 tablet, Rfl: 0  Current Facility-Administered Medications:  .  0.9 %  sodium chloride infusion, 500 mL, Intravenous, Continuous, Irene Shipper, MD  Social History   Tobacco Use  Smoking Status Former Smoker  . Types: Cigarettes  . Quit date: 04/25/2004  . Years since quitting: 14.5  Smokeless Tobacco Never Used    No Known Allergies Objective:   Vitals:   11/21/18 1539  Temp: 98.2 F (36.8 C)   There is no height or weight on file  to calculate BMI. Constitutional Well developed. Well nourished.  Vascular Right foot warm, slightly edematous. Capillary refill intact to digits 1,2,3,5. 4th toe capillary refill unable to be fully assessed. Pedal pulses palpable.  Neurologic Normal speech. Oriented to person, place, and time. Epicritic sensation to light touch grossly present bilaterally.  Dermatologic Healing surgical wound over the 2nd interspace with probe to bone. Adjacent superficial dehiscence without depth. 4th toe with hypergranulation, no probe to bone. No warmth erythema signs of gross infection.  Orthopedic: Right foot with rectus digits with good alignment toes 1-5. Pins intact 1st, 2nd, 3rd, 5th toes.     Assessment:   1. Acquired hammer toe   2. Hammertoes of both feet   3. Status post right foot surgery    Plan:  Patient was evaluated and treated and all questions answered.  S/p foot surgery right -XR: Taken and reviewed. Lysis of distal phalanx of 4th toe c/t prior. Possible lysis of 3rd metatarsal area. Bridging callus along 1st metatarsophalangeal arthrodesis site. -Wound over the 2nd metatarsal did probe deep. This was cultured. Will start packing over this area to promote healing. Order aquacell packing with [facility]. Wound packed with iodosorb today. -Recommend acticoat silver to the right 4th toe. To act as non-adherent and for silver ions as antimicrobial and to slightly reduce granulation tissue / cautery effect. Actisorb applied today -Betadine to be applied by [facility] to other wounds. -4th toe with hypergranular, healing wound to the approximate proximal interphalangeal joint. The distal aspect of the digit does appear to have ischemic changes. Patient did have sensation to the tip and it was not grossly necrotic. Discussed while there does appear to be decreased perfusion the area does not appear to be completely devitalized. Will have to closely monitor.  -Order non-invasive vascular studies. Eval toe pressures. -Continue current abx regimen. Would consider adjusting based upon cultures. At this point the current regimen does appear adequate. -F/u with Dr. Amalia Hailey next week.  Return in about 1 week (around 11/28/2018) for [facility] Patient.

## 2018-11-25 LAB — WOUND CULTURE
MICRO NUMBER:: 721626
SPECIMEN QUALITY:: ADEQUATE

## 2018-11-28 ENCOUNTER — Other Ambulatory Visit: Payer: Self-pay

## 2018-11-28 ENCOUNTER — Other Ambulatory Visit: Payer: Self-pay | Admitting: Podiatry

## 2018-11-28 ENCOUNTER — Ambulatory Visit (HOSPITAL_COMMUNITY)
Admission: RE | Admit: 2018-11-28 | Discharge: 2018-11-28 | Disposition: A | Discharge status: Home / Self Care | Payer: Medicare Other | Source: Ambulatory Visit | Attending: Cardiology | Admitting: Cardiology

## 2018-11-28 ENCOUNTER — Ambulatory Visit (INDEPENDENT_AMBULATORY_CARE_PROVIDER_SITE_OTHER): Payer: Self-pay | Admitting: Podiatry

## 2018-11-28 DIAGNOSIS — Z9889 Other specified postprocedural states: Secondary | ICD-10-CM

## 2018-11-28 DIAGNOSIS — M21619 Bunion of unspecified foot: Secondary | ICD-10-CM

## 2018-11-28 DIAGNOSIS — M05771 Rheumatoid arthritis with rheumatoid factor of right ankle and foot without organ or systems involvement: Secondary | ICD-10-CM

## 2018-11-28 DIAGNOSIS — M2010 Hallux valgus (acquired), unspecified foot: Secondary | ICD-10-CM

## 2018-11-28 DIAGNOSIS — M2040 Other hammer toe(s) (acquired), unspecified foot: Secondary | ICD-10-CM

## 2018-11-28 DIAGNOSIS — R0989 Other specified symptoms and signs involving the circulatory and respiratory systems: Secondary | ICD-10-CM

## 2018-11-28 DIAGNOSIS — M2042 Other hammer toe(s) (acquired), left foot: Secondary | ICD-10-CM

## 2018-11-28 DIAGNOSIS — M2041 Other hammer toe(s) (acquired), right foot: Secondary | ICD-10-CM

## 2018-11-28 NOTE — Progress Notes (Signed)
   Subjective:  Patient presents today status post right forefoot reconstruction surgery. DOS: 10/18/2018.  Concern for the fourth digit viabilty over the past few weeks. Vascular studies ordered last week, although there have been adequate pedal pulses prior to and after surgery.  Studies are within normal limits.  He has been having home health dressing changes every other day using aquacel and DSD.  Patient has no new complaints at this time  Past Medical History:  Diagnosis Date  . Anemia    H/o using iron in the past   . Arthritis    rheumatoid  . Chronic back pain   . Hyperlipidemia   . Joint pain   . Joint swelling   . Rheumatoid arthritis(714.0)       Objective/Physical Exam Neurovascular status intact.  Hyper granular tissue noted to the medial aspect of the fourth digit of the surgical foot.  There is a focal area of dehiscence overlying the third toe dorsal incision.  This dehiscence does not probe to bone.  There continues to be some moderate drainage to the dehiscence site as well as the fourth toe with periwound maceration. Vascular status appears to be intact.  I am able to palpate the posterior tibial and dorsalis pedis arteries to the surgical extremity.  The skin is cool to touch however.  Assessment: 1. s/p right forefoot reconstruction surgery. DOS: 10/18/2018   Plan of Care:  1. Patient was evaluated. Aquacel and DSD applied.  2.  Continue home health dressing changes every other day using Aquacel and DSD.  3.  Percutaneous fixation pins were removed today. 4.  Continue weightbearing in the immobilization cam boot 5.  Return to clinic in 2 weeks  Edrick Kins, DPM [facility]  Dr. Edrick Kins, [place]                                        [place], [place] [address]                Office [phone]  Fax [phone]

## 2018-11-29 ENCOUNTER — Telehealth: Payer: Self-pay | Admitting: *Deleted

## 2018-11-29 ENCOUNTER — Telehealth: Payer: Self-pay | Admitting: Podiatry

## 2018-11-29 MED ORDER — OXYCODONE-ACETAMINOPHEN 5-325 MG PO TABS: 1.0000 | ORAL_TABLET | Freq: Four times a day (QID) | ORAL | 0 refills | Status: DC | PRN

## 2018-11-29 NOTE — Addendum Note (Signed)
Addended by: Hardie Pulley on: 11/29/2018 04:46 PM   Modules accepted: Orders

## 2018-11-29 NOTE — Telephone Encounter (Signed)
I informed pt the [facility]

## 2018-11-29 NOTE — Telephone Encounter (Signed)
Dr. Amalia Hailey ordered Aquacel AG to all wound sites of right surgery foot per Gretta Arab, RN. Faxed orders to [facility].

## 2018-11-29 NOTE — Telephone Encounter (Signed)
Pt is requesting pain medication, he has ran out

## 2018-12-04 ENCOUNTER — Telehealth: Payer: Self-pay | Admitting: *Deleted

## 2018-12-04 NOTE — Telephone Encounter (Signed)
Thank you :)

## 2018-12-04 NOTE — Telephone Encounter (Signed)
[facility] states pt has a new wound to left leg, a blister has popped up and ruptured, she states she would like to treat the area with the same wound care as the right, clean with wound cleanse, cover with Aquacel with Alginate and gauze with kerlix.

## 2018-12-04 NOTE — Telephone Encounter (Signed)
Left message informing Colletta Maryland - [facility] to use the same wound care as for the right foot.

## 2018-12-13 ENCOUNTER — Telehealth: Payer: Self-pay | Admitting: *Deleted

## 2018-12-13 ENCOUNTER — Encounter: Payer: Self-pay | Admitting: Podiatry

## 2018-12-13 ENCOUNTER — Other Ambulatory Visit: Payer: Self-pay | Admitting: Podiatry

## 2018-12-13 ENCOUNTER — Ambulatory Visit (INDEPENDENT_AMBULATORY_CARE_PROVIDER_SITE_OTHER): Payer: Medicare Other | Admitting: Podiatry

## 2018-12-13 ENCOUNTER — Ambulatory Visit (INDEPENDENT_AMBULATORY_CARE_PROVIDER_SITE_OTHER): Payer: Medicare Other

## 2018-12-13 ENCOUNTER — Other Ambulatory Visit: Payer: Self-pay

## 2018-12-13 VITALS — Temp 98.3°F

## 2018-12-13 DIAGNOSIS — M79672 Pain in left foot: Secondary | ICD-10-CM

## 2018-12-13 DIAGNOSIS — L97522 Non-pressure chronic ulcer of other part of left foot with fat layer exposed: Secondary | ICD-10-CM

## 2018-12-13 DIAGNOSIS — L24A9 Irritant contact dermatitis due friction or contact with other specified body fluids: Secondary | ICD-10-CM

## 2018-12-13 DIAGNOSIS — T148XXA Other injury of unspecified body region, initial encounter: Secondary | ICD-10-CM

## 2018-12-13 MED ORDER — OXYCODONE-ACETAMINOPHEN 5-325 MG PO TABS: 1.0000 | ORAL_TABLET | Freq: Four times a day (QID) | ORAL | 0 refills | Status: DC | PRN

## 2018-12-13 MED ORDER — DOXYCYCLINE HYCLATE 100 MG PO TABS: 100.0000 mg | ORAL_TABLET | Freq: Two times a day (BID) | ORAL | 0 refills | Status: DC

## 2018-12-13 NOTE — Telephone Encounter (Signed)
I informed [facility], that Dr. Jacqualyn Posey requested the left foot wound be dressed as the right surgery foot sites with Aquacell Ag every other day.

## 2018-12-13 NOTE — Telephone Encounter (Signed)
I called pt's home and his caregiver cousin, Kelli Churn and informed that pt's [place] thought pt needed to be seen, and I was calling to set up an appt. I told Vernell that our office had an 9:15am appt, and she accepted.

## 2018-12-13 NOTE — Telephone Encounter (Signed)
[facility] states the surgery foot looks good but the left foot that had the blister is looking worse, and she states it is due to pt wearing a shoe he has been asked not to wear.

## 2018-12-13 NOTE — Telephone Encounter (Signed)
I called [facility] and she stated she had wanted pt to be seen today.

## 2018-12-17 ENCOUNTER — Encounter: Payer: Self-pay | Admitting: Podiatry

## 2018-12-17 ENCOUNTER — Other Ambulatory Visit: Payer: Self-pay

## 2018-12-17 ENCOUNTER — Ambulatory Visit (INDEPENDENT_AMBULATORY_CARE_PROVIDER_SITE_OTHER): Payer: Self-pay | Admitting: Podiatry

## 2018-12-17 VITALS — Temp 97.3°F

## 2018-12-17 DIAGNOSIS — L24A9 Irritant contact dermatitis due friction or contact with other specified body fluids: Secondary | ICD-10-CM

## 2018-12-17 DIAGNOSIS — T8189XD Other complications of procedures, not elsewhere classified, subsequent encounter: Secondary | ICD-10-CM

## 2018-12-17 DIAGNOSIS — I872 Venous insufficiency (chronic) (peripheral): Secondary | ICD-10-CM

## 2018-12-17 DIAGNOSIS — T148XXA Other injury of unspecified body region, initial encounter: Secondary | ICD-10-CM

## 2018-12-17 LAB — WOUND CULTURE
MICRO NUMBER:: 794100
SPECIMEN QUALITY:: ADEQUATE

## 2018-12-17 MED ORDER — CIPROFLOXACIN HCL 500 MG PO TABS: 500.0000 mg | ORAL_TABLET | Freq: Two times a day (BID) | ORAL | 0 refills | Status: DC

## 2018-12-17 MED ORDER — SULFAMETHOXAZOLE-TRIMETHOPRIM 800-160 MG PO TABS: 1.0000 | ORAL_TABLET | Freq: Two times a day (BID) | ORAL | 0 refills | Status: DC

## 2018-12-17 MED ORDER — OXYCODONE-ACETAMINOPHEN 5-325 MG PO TABS: 1.0000 | ORAL_TABLET | Freq: Four times a day (QID) | ORAL | 0 refills | Status: DC | PRN

## 2018-12-17 NOTE — Progress Notes (Addendum)
   Subjective:  Patient presents today status post right forefoot reconstruction surgery. DOS: 10/18/2018.  Patient is doing very well to the right foot with improvement of the ulcerations that developed postoperatively.  Over the last few weeks he has developed ulcerations to the left foot which are idiopathic in nature.  He denies any trauma to the area.  He presents for further treatment and evaluation  Past Medical History:  Diagnosis Date  . Anemia    H/o using iron in the past   . Arthritis    rheumatoid  . Chronic back pain   . Hyperlipidemia   . Joint pain   . Joint swelling   . Rheumatoid arthritis(714.0)       Objective/Physical Exam Neurovascular status intact.  Hyper granular tissue noted to the medial aspect of the fourth digit of the surgical foot.  There is a focal area of dehiscence overlying the third toe dorsal incision measuring approximately 0.8 x 0.8 x 1.0 cm.  This dehiscence does not probe to bone however it is somewhat deep.  There continues to be some moderate drainage to the dehiscence site as well as the fourth toe with periwound maceration. Vascular status appears to be intact.  I am able to palpate the posterior tibial and dorsalis pedis arteries to the surgical extremity.  The skin is cool to touch however.  Multiple ulcerations noted to the left lower extremity, nonsurgical extremity.  Most concerning ulcer is to the dorsal aspect of the left forefoot measuring approximately 4.0 x 4.0 x 0.3 cm.  To the noted ulceration there is a moderate amount of fibrotic tissue noted.  Mild malodor noted.  There is no exposed bone muscle tendon ligament or joint.  Moderate amount of serosanguineous drainage noted.  Periwound integrity is intact.  Assessment: 1. s/p right forefoot reconstruction surgery. DOS: 10/18/2018 2.  Multiple ulcerations left lower extremity   Plan of Care:  1. Patient was evaluated. Aquacel and DSD applied.  2.  Continue home health dressing  changes every other day using Aquacel and DSD.  Dressing changes were modified today to allow packing of iodoform gauze into the ulceration site to the right dorsal foot 3.  Continue weightbearing in the immobilization cam boot 5.  Today we are going to order blood work and new labs 6.  We are also going to refer the patient to [facility] wound care center for the multiple ulcerations that have developed and to let them manage the bilateral lower extremity ulcers.  I will sign off for management to wound care as soon as they are able to see him since surgery appears stable with routine healing with exception of the ulcerations that have developed. 7.  Prescription for Bactrim DS and ciprofloxacin 500 mg twice daily x10 days as per culture results which were reviewed today.  8.  Return to clinic in 2 weeks   Edrick Kins, DPM [facility]  Dr. Edrick Kins, [address]                                        [place], [place] [address]                Office [phone]  Fax [phone]

## 2018-12-17 NOTE — Addendum Note (Signed)
Addended by: Edrick Kins on: 12/17/2018 06:24 PM   Modules accepted: Orders

## 2018-12-18 ENCOUNTER — Telehealth: Payer: Self-pay | Admitting: *Deleted

## 2018-12-18 DIAGNOSIS — M79672 Pain in left foot: Secondary | ICD-10-CM

## 2018-12-18 DIAGNOSIS — T8189XD Other complications of procedures, not elsewhere classified, subsequent encounter: Secondary | ICD-10-CM

## 2018-12-18 DIAGNOSIS — T148XXA Other injury of unspecified body region, initial encounter: Secondary | ICD-10-CM

## 2018-12-18 DIAGNOSIS — I872 Venous insufficiency (chronic) (peripheral): Secondary | ICD-10-CM

## 2018-12-18 DIAGNOSIS — L24A9 Irritant contact dermatitis due friction or contact with other specified body fluids: Secondary | ICD-10-CM

## 2018-12-18 NOTE — Telephone Encounter (Signed)
Faxed referral to [facility], with clinicals and demographics. F[facility]ed [facility] care [facility] to [facility] with notification to follow up with [facility] once pt is seen by Wound Care physicians.

## 2018-12-18 NOTE — Telephone Encounter (Signed)
-----   Message from Edrick Kins, DPM sent at 12/17/2018  6:20 PM EDT ----- Regarding: Referral to Wound Care and also modification of home wound care orders Jonathan [name],   Please refer patient to [facility] wound care center for multiple bilateral lower extremity wounds. Right foot surgery performed on 10/18/2018.  I am signing over wound care to them to manage the ulcerations to the bilateral feet.  Also, please modify home health dressing change orders to include iodoform packing right dorsal ulceration.   Thanks, Dr. Amalia Hailey

## 2018-12-19 NOTE — Progress Notes (Signed)
Subjective: 61-year male presents via say for acute appointment for possible blister that has drained on the top of his left foot.  He is not sure how it started.  His home health nurse did notice this.  He previously underwent a rheumatoid reconstruction on the right side and doing well.  There is been vascular compromise to the fourth digit but this appears to be healing better.  Also had our nurse Marylou Mccoy who was made seeing him regularly evaluate him today as this was my first time seeing him.  The right side is been doing better.  The left side is new. Denies any systemic complaints such as fevers, chills, nausea, vomiting. No acute changes since last appointment, and no other complaints at this time.   Objective: AAO x3, NAD DP/PT pulses palpable bilaterally, CRT less than 3 seconds On the dorsal aspect of left foot appears to be meeting all blister but there is a wound present measuring 3 x 2 cm with a fibro-granular wound base.  There is some swelling to the foot there is no erythema or warmth.  There is no ascending cellulitis.  There is also a smaller blister on the heel medially but this is small and not open at this time.  Right fourth toe appears to be doing better. No pain with calf compression, swelling, warmth, erythema  Assessment: Left foot ulceration, posterior; Healing wound right foot  Plan: -All treatment options discussed with the patient including all alternatives, risks, complications.  -X-rays were obtained and reviewed with the patient. No evidence of acute fracture or stress fracture. No obvious signs of osteomyelitis.  -Lethargy likely debridement with any complications or bleeding.  Continue Aquasol dressings. Today Actisorb was applied -Wound culture -Doxycyline -Offloading -If any worsening he needs to go to the ER. -Patient encouraged to call the office with any questions, concerns, change in symptoms.   RTC with Dr. Amalia Hailey as scheduled or sooner if  needed  Trula Slade DPM

## 2018-12-20 ENCOUNTER — Other Ambulatory Visit: Payer: Self-pay

## 2018-12-20 ENCOUNTER — Encounter (HOSPITAL_COMMUNITY): Payer: Self-pay

## 2018-12-20 DIAGNOSIS — M069 Rheumatoid arthritis, unspecified: Secondary | ICD-10-CM | POA: Diagnosis present

## 2018-12-20 DIAGNOSIS — Z96653 Presence of artificial knee joint, bilateral: Secondary | ICD-10-CM | POA: Diagnosis present

## 2018-12-20 DIAGNOSIS — Z96643 Presence of artificial hip joint, bilateral: Secondary | ICD-10-CM | POA: Diagnosis present

## 2018-12-20 DIAGNOSIS — Z7901 Long term (current) use of anticoagulants: Secondary | ICD-10-CM

## 2018-12-20 DIAGNOSIS — Z87891 Personal history of nicotine dependence: Secondary | ICD-10-CM

## 2018-12-20 DIAGNOSIS — G8929 Other chronic pain: Secondary | ICD-10-CM | POA: Diagnosis present

## 2018-12-20 DIAGNOSIS — M154 Erosive (osteo)arthritis: Secondary | ICD-10-CM | POA: Diagnosis present

## 2018-12-20 DIAGNOSIS — Z7951 Long term (current) use of inhaled steroids: Secondary | ICD-10-CM

## 2018-12-20 DIAGNOSIS — Z8249 Family history of ischemic heart disease and other diseases of the circulatory system: Secondary | ICD-10-CM

## 2018-12-20 DIAGNOSIS — Z20828 Contact with and (suspected) exposure to other viral communicable diseases: Secondary | ICD-10-CM | POA: Diagnosis present

## 2018-12-20 DIAGNOSIS — Z114 Encounter for screening for human immunodeficiency virus [HIV]: Secondary | ICD-10-CM

## 2018-12-20 DIAGNOSIS — D649 Anemia, unspecified: Secondary | ICD-10-CM | POA: Diagnosis present

## 2018-12-20 DIAGNOSIS — L97529 Non-pressure chronic ulcer of other part of left foot with unspecified severity: Principal | ICD-10-CM | POA: Diagnosis present

## 2018-12-20 DIAGNOSIS — Z79899 Other long term (current) drug therapy: Secondary | ICD-10-CM

## 2018-12-20 DIAGNOSIS — Z79891 Long term (current) use of opiate analgesic: Secondary | ICD-10-CM

## 2018-12-20 DIAGNOSIS — E785 Hyperlipidemia, unspecified: Secondary | ICD-10-CM | POA: Diagnosis present

## 2018-12-20 NOTE — ED Triage Notes (Signed)
Pt c/o blister on left foot that is sore and has drainage. Dr at [facility] told patient to come to the ED if the drainage and wound got worse.

## 2018-12-20 NOTE — ED Triage Notes (Signed)
Per EMS- Patient is from home. Patient had surgery on his right foot and patient walked with body weight on his left foot and now has an ulcer present to the left foot.

## 2018-12-21 ENCOUNTER — Emergency Department (HOSPITAL_COMMUNITY): Payer: Medicare Other

## 2018-12-21 ENCOUNTER — Other Ambulatory Visit: Payer: Self-pay

## 2018-12-21 ENCOUNTER — Inpatient Hospital Stay (HOSPITAL_COMMUNITY)
Admission: EM | Admit: 2018-12-21 | Discharge: 2018-12-23 | DRG: 594 | Disposition: A | Discharge status: Home Health | Payer: Medicare Other | Attending: Internal Medicine | Admitting: Internal Medicine

## 2018-12-21 ENCOUNTER — Encounter (HOSPITAL_COMMUNITY): Payer: Self-pay

## 2018-12-21 DIAGNOSIS — T148XXA Other injury of unspecified body region, initial encounter: Secondary | ICD-10-CM | POA: Diagnosis present

## 2018-12-21 DIAGNOSIS — L97529 Non-pressure chronic ulcer of other part of left foot with unspecified severity: Secondary | ICD-10-CM | POA: Diagnosis not present

## 2018-12-21 DIAGNOSIS — L97509 Non-pressure chronic ulcer of other part of unspecified foot with unspecified severity: Secondary | ICD-10-CM | POA: Diagnosis present

## 2018-12-21 DIAGNOSIS — E785 Hyperlipidemia, unspecified: Secondary | ICD-10-CM

## 2018-12-21 DIAGNOSIS — Z114 Encounter for screening for human immunodeficiency virus [HIV]: Secondary | ICD-10-CM

## 2018-12-21 DIAGNOSIS — L24A9 Irritant contact dermatitis due friction or contact with other specified body fluids: Secondary | ICD-10-CM | POA: Diagnosis present

## 2018-12-21 LAB — COMPREHENSIVE METABOLIC PANEL
ALT: 23 U/L (ref 0–44)
AST: 34 U/L (ref 15–41)
Albumin: 3.5 g/dL (ref 3.5–5.0)
Alkaline Phosphatase: 90 U/L (ref 38–126)
Anion gap: 11 (ref 5–15)
BUN: 8 mg/dL (ref 6–20)
CO2: 22 mmol/L (ref 22–32)
Calcium: 10.7 mg/dL — ABNORMAL HIGH (ref 8.9–10.3)
Chloride: 104 mmol/L (ref 98–111)
Creatinine, Ser: 0.72 mg/dL (ref 0.61–1.24)
GFR calc Af Amer: >60 mL/min (ref >60)
GFR calc non Af Amer: >60 mL/min (ref >60)
Glucose, Bld: 102 mg/dL — ABNORMAL HIGH (ref 70–99)
Potassium: 3.6 mmol/L (ref 3.5–5.1)
Sodium: 137 mmol/L (ref 135–145)
Total Bilirubin: 0.6 mg/dL (ref 0.3–1.2)
Total Protein: 8.5 g/dL — ABNORMAL HIGH (ref 6.5–8.1)

## 2018-12-21 LAB — CBC WITH DIFFERENTIAL/PLATELET
Abs Immature Granulocytes: 0.1 10*3/uL — ABNORMAL HIGH (ref 0.00–0.07)
Basophils Absolute: 0 10*3/uL (ref 0.0–0.1)
Basophils Relative: 1 %
Eosinophils Absolute: 0.4 10*3/uL (ref 0.0–0.5)
Eosinophils Relative: 7 %
HCT: 34.4 % — ABNORMAL LOW (ref 39.0–52.0)
Hemoglobin: 10.5 g/dL — ABNORMAL LOW (ref 13.0–17.0)
Immature Granulocytes: 2 %
Lymphocytes Relative: 15 %
Lymphs Abs: 0.9 10*3/uL (ref 0.7–4.0)
MCH: 23.1 pg — ABNORMAL LOW (ref 26.0–34.0)
MCHC: 30.5 g/dL (ref 30.0–36.0)
MCV: 75.8 fL — ABNORMAL LOW (ref 80.0–100.0)
Monocytes Absolute: 0.8 10*3/uL (ref 0.1–1.0)
Monocytes Relative: 13 %
Neutro Abs: 4 10*3/uL (ref 1.7–7.7)
Neutrophils Relative %: 62 %
Platelets: 352 10*3/uL (ref 150–400)
RBC: 4.54 MIL/uL (ref 4.22–5.81)
RDW: 15.6 % — ABNORMAL HIGH (ref 11.5–15.5)
WBC: 6.3 10*3/uL (ref 4.0–10.5)
nRBC: 0 % (ref 0.0–0.2)

## 2018-12-21 LAB — SEDIMENTATION RATE: Sed Rate: 101 mm/hr — ABNORMAL HIGH (ref 0–16)

## 2018-12-21 LAB — SARS CORONAVIRUS 2 (TAT 6-24 HRS): SARS Coronavirus 2: NEGATIVE

## 2018-12-21 LAB — C-REACTIVE PROTEIN: CRP: 14 mg/dL — ABNORMAL HIGH (ref <1.0)

## 2018-12-21 MED ORDER — ACETAMINOPHEN 650 MG RE SUPP: 650.0000 mg | Freq: Four times a day (QID) | RECTAL | Status: DC | PRN

## 2018-12-21 MED ORDER — CIPROFLOXACIN HCL 500 MG PO TABS
500.0000 mg | ORAL_TABLET | Freq: Two times a day (BID) | ORAL | Status: DC
  Administered 2018-12-21 – 2018-12-23 (×5): 500 mg via ORAL
  Filled 2018-12-21 (×5): qty 1

## 2018-12-21 MED ORDER — ATORVASTATIN CALCIUM 40 MG PO TABS
40.0000 mg | ORAL_TABLET | Freq: Every day | ORAL | Status: DC
  Administered 2018-12-21 – 2018-12-23 (×3): 40 mg via ORAL
  Filled 2018-12-21 (×3): qty 1

## 2018-12-21 MED ORDER — LORATADINE 10 MG PO TABS
10.0000 mg | ORAL_TABLET | Freq: Every day | ORAL | Status: DC
  Administered 2018-12-21 – 2018-12-23 (×3): 10 mg via ORAL
  Filled 2018-12-21 (×3): qty 1

## 2018-12-21 MED ORDER — HEPARIN SODIUM (PORCINE) 5000 UNIT/ML IJ SOLN
5000.0000 [IU] | Freq: Three times a day (TID) | INTRAMUSCULAR | Status: DC
  Administered 2018-12-21 – 2018-12-23 (×6): 5000 [IU] via SUBCUTANEOUS
  Filled 2018-12-21 (×8): qty 1

## 2018-12-21 MED ORDER — GABAPENTIN 300 MG PO CAPS
300.0000 mg | ORAL_CAPSULE | Freq: Two times a day (BID) | ORAL | Status: DC
  Administered 2018-12-21 – 2018-12-23 (×5): 300 mg via ORAL
  Filled 2018-12-21 (×5): qty 1

## 2018-12-21 MED ORDER — OXYCODONE-ACETAMINOPHEN 5-325 MG PO TABS
1.0000 | ORAL_TABLET | Freq: Four times a day (QID) | ORAL | Status: DC | PRN
  Administered 2018-12-21 – 2018-12-22 (×2): 2 via ORAL
  Administered 2018-12-23: 09:00:00 1 via ORAL
  Filled 2018-12-21: qty 1
  Filled 2018-12-21 (×2): qty 2

## 2018-12-21 MED ORDER — FLUTICASONE PROPIONATE 50 MCG/ACT NA SUSP
1.0000 | Freq: Every day | NASAL | Status: DC
  Administered 2018-12-23: 10:00:00 1 via NASAL
  Filled 2018-12-21: qty 16

## 2018-12-21 MED ORDER — ACETAMINOPHEN 325 MG PO TABS
650.0000 mg | ORAL_TABLET | Freq: Four times a day (QID) | ORAL | Status: DC | PRN
  Administered 2018-12-22: 650 mg via ORAL
  Filled 2018-12-21: qty 2

## 2018-12-21 MED ORDER — OXYCODONE-ACETAMINOPHEN 5-325 MG PO TABS
1.0000 | ORAL_TABLET | Freq: Once | ORAL | Status: AC
  Administered 2018-12-21: 1 via ORAL
  Filled 2018-12-21: qty 1

## 2018-12-21 MED ORDER — SULFAMETHOXAZOLE-TRIMETHOPRIM 800-160 MG PO TABS
1.0000 | ORAL_TABLET | Freq: Two times a day (BID) | ORAL | Status: DC
  Administered 2018-12-21 – 2018-12-23 (×5): 1 via ORAL
  Filled 2018-12-21 (×6): qty 1

## 2018-12-21 NOTE — ED Notes (Signed)
Pt provided breakfast tray. Denies further needs at this time

## 2018-12-21 NOTE — H&P (Signed)
History and Physical    Atthew [name] QQV:[id number] DOB: 11-28-1960 DOA: 12/21/2018  PCP: Jonathan Ship, MD Patient coming from: Home  Chief Complaint: Left foot ulcer  HPI: Jonathan [name] is a 58 y.o. male with medical history significant of anemia, rheumatoid arthritis, hyperlipidemia being sent to the hospital by podiatry for evaluation of left foot ulcer.  Patient is followed by Dr. Amalia [name] with podiatry and underwent right forefoot reconstruction secondary to an acquired hammertoe on June 25.  He was seen by podiatry on 8/24 for his left foot ulcer and prescribed a 10-day course of Bactrim and Cipro.  Patient states he noticed a little wound on his foot about 3 weeks ago and since then it has been getting bigger in size and the area is painful.  States he has a home health nurse who visits every other day and noticed that his wound has been draining puslike material.  Denies fevers or chills.  States he has been taking Bactrim and Cipro which were recently prescribed.  No other complaints.  ED Course: Hemodynamically stable.  Afebrile and no leukocytosis.  Hemoglobin 10.5, baseline.  ESR 101, CRP 14.  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  No definite acute interval osseous changes. Patient received Percocet.  Review of Systems:  All systems reviewed and apart from history of presenting illness, are negative.  Past Medical History:  Diagnosis Date  . Anemia    H/o using iron in the past   . Arthritis    rheumatoid  . Chronic back pain   . Hyperlipidemia   . Joint pain   . Joint swelling   . Rheumatoid arthritis(714.0)     Past Surgical History:  Procedure Laterality Date  . COLONOSCOPY N/A 04/03/2013   Procedure: COLONOSCOPY;  Surgeon: Jonathan Shipper, MD;  Location: [facility];  Service: Endoscopy;  Laterality: N/A;  . COLONOSCOPY    . ESOPHAGOGASTRODUODENOSCOPY N/A 04/03/2013   Procedure:  ESOPHAGOGASTRODUODENOSCOPY (EGD);  Surgeon: Jonathan Shipper, MD;  Location: [facility];  Service: Endoscopy;  Laterality: N/A;  changed to moderate dr. Henrene Pastor did not want to go to the o.r. for an endo colon-jmt  . EYE SURGERY Left    "when i was a very small boy"  . JOINT REPLACEMENT     both knees  . KNEE ARTHROPLASTY  06/07/2011   Procedure: COMPUTER ASSISTED TOTAL KNEE ARTHROPLASTY;  Surgeon: Jonathan Rossetti, MD;  Location: [facility];  Service: Orthopedics;  Laterality: Right;  Right total knee arthoplasty  . KNEE CLOSED REDUCTION  07/19/2011   Procedure: CLOSED MANIPULATION KNEE;  Surgeon: Jonathan Rossetti, MD;  Location: [facility];  Service: Orthopedics;  Laterality: Right;  Manipulation under anesthesia right knee  . TOTAL HIP ARTHROPLASTY Right 11/11/2014   Procedure: RIGHT TOTAL HIP ARTHROPLASTY ANTERIOR APPROACH;  Surgeon: Jonathan Rossetti, MD;  Location: [facility];  Service: Orthopedics;  Laterality: Right;  . TOTAL HIP ARTHROPLASTY Left 01/29/2015   Procedure: LEFT TOTAL HIP ARTHROPLASTY ANTERIOR APPROACH;  Surgeon: Jonathan Rossetti, MD;  Location: [facility];  Service: Orthopedics;  Laterality: Left;     reports that he quit smoking about 14 years ago. His smoking use included cigarettes. He has never used smokeless tobacco. He reports that he does not drink alcohol or use drugs.  No Known Allergies  Family History  Problem Relation Age of Onset  . Cancer Mother        ?  Jonathan [name]  Hypertension Mother   . Heart failure Father   . Cancer Brother   . Anesthesia problems Neg Hx   . Colon cancer Neg Hx        mother had "3" types of CA- unsure about which one  . Esophageal cancer Neg Hx   . Rectal cancer Neg Hx   . Stomach cancer Neg Hx     Prior to Admission medications   Medication Sig Start Date End Date Taking? Authorizing Provider  atorvastatin (LIPITOR) 40 MG tablet Take 40 mg by mouth daily.   Yes [provider]  cetirizine (ZYRTEC) 10 MG tablet Take 10 mg by  mouth daily.  07/16/18  Yes [provider]  ciprofloxacin (CIPRO) 500 MG tablet Take 1 tablet (500 mg total) by mouth 2 (two) times daily. 12/17/18  Yes Jonathan [name], DPM  fluticasone (FLONASE) 50 MCG/ACT nasal spray Place 1 spray into the nose daily.  07/16/18  Yes [provider]  gabapentin (NEURONTIN) 300 MG capsule Take 300 mg by mouth 2 (two) times daily.  03/24/16  Yes [provider]  oxyCODONE-acetaminophen (PERCOCET) 5-325 MG tablet Take 1 tablet by mouth every 6 (six) hours as needed for severe pain. 12/17/18  Yes Jonathan [name], DPM  sulfamethoxazole-trimethoprim (BACTRIM DS) 800-160 MG tablet Take 1 tablet by mouth 2 (two) times daily. 12/17/18  Yes Jonathan [name], DPM  cephALEXin (KEFLEX) 500 MG capsule Take 1 capsule (500 mg total) by mouth 3 (three) times daily. Patient not taking: Reported on 12/21/2018 10/18/18   Jonathan [name], DPM  doxycycline (VIBRA-TABS) 100 MG tablet Take 1 tablet (100 mg total) by mouth 2 (two) times daily. Patient not taking: Reported on 12/21/2018 12/13/18   Jonathan [name], DPM  inFLIXimab (REMICADE) 100 MG injection Inject 300 mg into the vein. Hasn't started yet 04/14/16   [provider]  methocarbamol (ROBAXIN) 500 MG tablet Take 1 tablet (500 mg total) by mouth every 6 (six) hours as needed for muscle spasms. Patient not taking: Reported on 12/21/2018 02/02/15   Jonathan Rossetti, MD  rivaroxaban Alveda Reasons) 10 MG TABS tablet Take 10 mg by mouth daily with breakfast. 06/10/11 07/19/11  Jonathan Rossetti, MD    Physical Exam: Vitals:   12/20/18 1920 12/20/18 1922 12/21/18 0126 12/21/18 0344  BP:  136/84 (!) 144/101 (!) 132/92  Pulse:  89 75 96  Resp:  16 19 15   Temp:  99 F (37.2 C)    TempSrc:  Oral    SpO2:  98% 97% 97%  Weight: 77.1 kg     Height: 5' 3"  (1.6 m)       Physical Exam  Constitutional: He is oriented to person, place, and time. He appears well-developed and well-nourished. No  distress.  HENT:  Head: Normocephalic.  Mouth/Throat: Oropharynx is clear and moist.  Eyes: Right eye exhibits no discharge. Left eye exhibits no discharge.  Neck: Neck supple.  Cardiovascular: Normal rate, regular rhythm and intact distal pulses.  Pulmonary/Chest: Effort normal and breath sounds normal. No respiratory distress. He has no wheezes. He has no rales.  Abdominal: Soft. Bowel sounds are normal. He exhibits no distension. There is no abdominal tenderness. There is no guarding.  Musculoskeletal:     Comments: A large ulcer noted on the dorsal aspect of the left foot.  In addition, a smaller ulcer noted on the medial aspect of the left foot.  Dorsum of the foot and digits appear swollen.  No significant drainage.  Please see images.  Neurological: He is alert and oriented to person, place, and time.  Skin: Skin is warm and dry. He is not diaphoretic.   Dorsum of left foot   Dorsum of left foot   Medial aspect of left foot     Labs on Admission: I have personally reviewed following labs and imaging studies  CBC: Recent Labs  Lab 12/21/18 0149  WBC 6.3  NEUTROABS 4.0  HGB 10.5*  HCT 34.4*  MCV 75.8*  PLT 390   Basic Metabolic Panel: Recent Labs  Lab 12/21/18 0149  NA 137  K 3.6  CL 104  CO2 22  GLUCOSE 102*  BUN 8  CREATININE 0.72  CALCIUM 10.7*   GFR: Estimated Creatinine Clearance: 93.7 mL/min (by C-G formula based on SCr of 0.72 mg/dL). Liver Function Tests: Recent Labs  Lab 12/21/18 0149  AST 34  ALT 23  ALKPHOS 90  BILITOT 0.6  PROT 8.5*  ALBUMIN 3.5   No results for input(s): LIPASE, AMYLASE in the last 168 hours. No results for input(s): AMMONIA in the last 168 hours. Coagulation Profile: No results for input(s): INR, PROTIME in the last 168 hours. Cardiac Enzymes: No results for input(s): CKTOTAL, CKMB, CKMBINDEX, TROPONINI in the last 168 hours. BNP (last 3 results) No results for input(s): PROBNP in the last 8760 hours. HbA1C:  No results for input(s): HGBA1C in the last 72 hours. CBG: No results for input(s): GLUCAP in the last 168 hours. Lipid Profile: No results for input(s): CHOL, HDL, LDLCALC, TRIG, CHOLHDL, LDLDIRECT in the last 72 hours. Thyroid Function Tests: No results for input(s): TSH, T4TOTAL, FREET4, T3FREE, THYROIDAB in the last 72 hours. Anemia Panel: No results for input(s): VITAMINB12, FOLATE, FERRITIN, TIBC, IRON, RETICCTPCT in the last 72 hours. Urine analysis:    Component Value Date/Time   COLORURINE YELLOW 07/09/2013 1801   APPEARANCEUR CLEAR 07/09/2013 1801   LABSPEC 1.023 07/09/2013 1801   PHURINE 5.5 07/09/2013 1801   GLUCOSEU NEGATIVE 07/09/2013 1801   HGBUR NEGATIVE 07/09/2013 1801   BILIRUBINUR SMALL (A) 07/09/2013 1801   KETONESUR 40 (A) 07/09/2013 1801   PROTEINUR NEGATIVE 07/09/2013 1801   UROBILINOGEN 0.2 07/09/2013 1801   NITRITE NEGATIVE 07/09/2013 1801   LEUKOCYTESUR NEGATIVE 07/09/2013 1801    Radiological Exams on Admission: Dg Foot Complete Left  Result Date: 12/21/2018 CLINICAL DATA:  Foot wound EXAM: LEFT FOOT - COMPLETE 3+ VIEW COMPARISON:  12/13/2018, 11/21/2018, 08/22/2018, 05/03/2017, 01/17/2017 FINDINGS: No acute displaced fracture is visualized. Lateral deviation at the MTP joints as before. Erosive arthropathy involving the MTP and IP joints as before. Intertarsal erosive changes as well. No soft tissue emphysema. Dorsal spurring. Possible calcified loose body anterior to the ankle. Small plantar calcaneal spur. 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 No definite acute interval osseous changes. Electronically Signed   By: Donavan Foil M.D.   On: 12/21/2018 02:31    Assessment/Plan Principal Problem:   Foot ulcer ([place]) Active Problems:   HLD (hyperlipidemia)   Encounter for screening for HIV   Large ulcer at the dorsum of the left foot/ concern for osteomyelitis A large ulcer noted on the  dorsal aspect of the left foot and a smaller ulcer on the medial aspect.  No significant drainage.  Please see images.  No fever, tachycardia, or leukocytosis.  ESR and CRP significantly elevated.  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  No definite acute interval osseous changes. -Patient was started on a 10-day course of Bactrim and Cipro by podiatry on 8/24.  Will continue. -Norco 5-325 mg 1 to 2 tablets every 6 hours as needed for pain.  In addition, continue home gabapentin. -Wound care consult -MRI of left foot to rule out osteomyelitis -Consult podiatry in a.m.  HIV screening The patient falls between the ages of 13-64 and should be screened for HIV, therefore HIV testing ordered.  Hyperlipidemia -Continue Lipitor  DVT prophylaxis: Subcutaneous heparin Code Status: Patient wishes to be full code. Family Communication: No family available. Disposition Plan: Anticipate discharge after clinical improvement. Consults called: None Admission status: It is my clinical opinion that referral for OBSERVATION is reasonable and necessary in this patient based on the above information provided. The aforementioned taken together are felt to place the patient at high risk for further clinical deterioration. However it is anticipated that the patient may be medically stable for discharge from the hospital within 24 to 48 hours.  The medical decision making on this patient was of high complexity and the patient is at high risk for clinical deterioration, therefore this is a level 3 visit.  Shela Leff MD [facility] Pager [phone]  If 7PM-7AM, please contact night-coverage [email or web address] Password [email or web address]  12/21/2018, 4:55 AM

## 2018-12-21 NOTE — ED Notes (Signed)
Left foot wound care completed, per order. Wounds cleansed with saline and patted dry. Mepitel and Aquacel placed to anterior wound and aquacel placed to medial wound. All dressings were covered with 4x4 guaze and wrapped in kerlex gauze. Patient tolerated well.

## 2018-12-21 NOTE — Progress Notes (Signed)
  PROGRESS NOTE  Patient admitted earlier this morning. See H&P. Jonathan [name] is a 58 y.o. male with medical history significant of anemia, rheumatoid arthritis, hyperlipidemia being sent to the hospital by podiatry for evaluation of left foot ulcer.  Patient is followed by Dr. Amalia Hailey with podiatry and underwent right forefoot reconstruction secondary to an acquired hammertoe on June 25.  He was seen by podiatry on 8/24 for his left foot ulcer and prescribed a 10-day course of Bactrim and Cipro. Patient states he noticed a little wound on his foot about 3 weeks ago and since then it has been getting bigger in size and the area is painful.  States he has a home health nurse who visits every other day and noticed that his wound has been draining puslike material.  Denies fevers or chills.  States he has been taking Bactrim and Cipro which were recently prescribed.  No other complaints. ESR 101, CRP 14.  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 COVID negative.   Left dorsal foot ulcer -MRI foot pending to rule out osteomyelitis.  If positive, will get podiatry involved -Patient was started on 10 day course cipro/bactrim on 8/24 by Podiatry  -Wound RN consulted. Recommend HHRN for dressing changes and follow up in [place] as outpatient.    Dessa Phi, DO [facility] [email or web address] 12/21/2018, 12:45 PM

## 2018-12-21 NOTE — ED Provider Notes (Signed)
[facility] DEPT Provider Note   CSN: [id number] Arrival date & time: 12/20/18  1905     History   Chief Complaint Chief Complaint  Patient presents with  . Foot Ulcer    HPI Jonathan [name] is a 58 y.o. male with a history of rheumatoid arthritis hyperlipidemia, and chronic back pain who presents emergency department by EMS with a chief complaint of left foot wound.  The patient reports that he developed a wound to the dorsum of the left foot several weeks ago.  Since that time, the wound has been draining, enlarging, and has become increasingly painful.  He now endorses pain to the entire dorsum and all digits of the left foot.  He reports that his home health nurse has been changing his dressings almost every other day.  His home health nurse expressed concern about the appearance of the wound today and advised him to come to the ER.  No fevers or chills.  No red streaking, erythema, or warmth.  He is able to ambulate, but it is painful.  He has been taking 800 mg of Bactrim twice daily and ciprofloxacin 500 mg BID since August 24.  He is followed by Dr. Amalia Hailey with podiatry and underwent right forefoot reconstruction surgery secondary to an acquired hammertoe on June 25.     The history is provided by the patient. No language interpreter was used.    Past Medical History:  Diagnosis Date  . Anemia    H/o using iron in the past   . Arthritis    rheumatoid  . Chronic back pain   . Hyperlipidemia   . Joint pain   . Joint swelling   . Rheumatoid arthritis(714.0)     Patient Active Problem List   Diagnosis Date Noted  . Foot ulcer ([place]) 12/21/2018  . Encounter for screening for HIV 12/21/2018  . Arthritis 08/22/2018  . HLD (hyperlipidemia) 08/22/2018  . Lung nodule 08/22/2018  . Renal cyst, right 08/22/2018  . Rheumatoid arthritis involving multiple sites with positive rheumatoid factor ([place]) 08/22/2018  . Venous insufficiency  08/22/2018  . Vitamin D deficiency 08/22/2018  . Seasonal allergic rhinitis 07/16/2018  . Essential hypertension 02/23/2018  . Insomnia 02/23/2018  . Tinea versicolor 02/23/2018  . Antalgic gait 09/21/2017  . Chronic pain of both knees 09/21/2017  . Foot pain, bilateral 09/21/2017  . Elevated liver enzymes 07/10/2017  . Leg edema 06/08/2017  . Primary osteoarthritis, left ankle and foot 05/09/2017  . Post-traumatic osteoarthritis, right ankle and foot 05/09/2017  . Primary osteoarthritis, right ankle and foot 05/09/2017  . Erectile dysfunction 02/15/2017  . Excessive sweating 02/15/2017  . Difficulty transferring 01/27/2017  . Acquired bilateral flat feet 11/15/2016  . Other acquired hammer toe 11/15/2016  . Erosive gastritis 09/05/2016  . At high risk for falls 08/15/2016  . Chronic bilateral low back pain without sciatica 10/13/2015  . Drug therapy 08/03/2015  . Rheumatoid arthritis involving left hip ([place]) 01/29/2015  . 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 Status post total replacement of right hip 11/11/2014  . Gait difficulty 08/21/2013  . Benign neoplasm of colon 04/03/2013  . Reflux esophagitis 04/03/2013  . Microcytic anemia 04/01/2013  . Chronic ulcer of right leg ([place]) 01/13/2013  . Pressure ulcer 01/13/2013  . 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 Degenerative arthritis of knee 06/07/2011    Past Surgical History:  Procedure Laterality Date  .  COLONOSCOPY N/A 04/03/2013   Procedure: COLONOSCOPY;  Surgeon: Irene Shipper, MD;  Location: [facility];  Service: Endoscopy;  Laterality: N/A;  . COLONOSCOPY    . ESOPHAGOGASTRODUODENOSCOPY N/A 04/03/2013   Procedure: ESOPHAGOGASTRODUODENOSCOPY (EGD);  Surgeon: Irene Shipper, MD;  Location: [facility];  Service: Endoscopy;  Laterality: N/A;  changed to moderate dr. Henrene Pastor did not want to go to the o.r. for an endo colon-jmt  . EYE  SURGERY Left    "when i was a very small boy"  . JOINT REPLACEMENT     both knees  . KNEE ARTHROPLASTY  06/07/2011   Procedure: COMPUTER ASSISTED TOTAL KNEE ARTHROPLASTY;  Surgeon: Mcarthur Rossetti, MD;  Location: [facility];  Service: Orthopedics;  Laterality: Right;  Right total knee arthoplasty  . KNEE CLOSED REDUCTION  07/19/2011   Procedure: CLOSED MANIPULATION KNEE;  Surgeon: Mcarthur Rossetti, MD;  Location: [facility];  Service: Orthopedics;  Laterality: Right;  Manipulation under anesthesia right knee  . TOTAL HIP ARTHROPLASTY Right 11/11/2014   Procedure: RIGHT TOTAL HIP ARTHROPLASTY ANTERIOR APPROACH;  Surgeon: Mcarthur Rossetti, MD;  Location: [facility];  Service: Orthopedics;  Laterality: Right;  . TOTAL HIP ARTHROPLASTY Left 01/29/2015   Procedure: LEFT TOTAL HIP ARTHROPLASTY ANTERIOR APPROACH;  Surgeon: Mcarthur Rossetti, MD;  Location: [facility];  Service: Orthopedics;  Laterality: Left;        Home Medications    Prior to Admission medications   Medication Sig Start Date End Date Taking? Authorizing Provider  atorvastatin (LIPITOR) 40 MG tablet Take 40 mg by mouth daily.   Yes [provider]  cetirizine (ZYRTEC) 10 MG tablet Take 10 mg by mouth daily.  07/16/18  Yes [provider]  ciprofloxacin (CIPRO) 500 MG tablet Take 1 tablet (500 mg total) by mouth 2 (two) times daily. 12/17/18  Yes Edrick Kins, DPM  fluticasone (FLONASE) 50 MCG/ACT nasal spray Place 1 spray into the nose daily.  07/16/18  Yes [provider]  gabapentin (NEURONTIN) 300 MG capsule Take 300 mg by mouth 2 (two) times daily.  03/24/16  Yes [provider]  oxyCODONE-acetaminophen (PERCOCET) 5-325 MG tablet Take 1 tablet by mouth every 6 (six) hours as needed for severe pain. 12/17/18  Yes Edrick Kins, DPM  sulfamethoxazole-trimethoprim (BACTRIM DS) 800-160 MG tablet Take 1 tablet by mouth 2 (two) times daily. 12/17/18  Yes Edrick Kins, DPM  cephALEXin (KEFLEX)  500 MG capsule Take 1 capsule (500 mg total) by mouth 3 (three) times daily. Patient not taking: Reported on 12/21/2018 10/18/18   Edrick Kins, DPM  doxycycline (VIBRA-TABS) 100 MG tablet Take 1 tablet (100 mg total) by mouth 2 (two) times daily. Patient not taking: Reported on 12/21/2018 12/13/18   Trula Slade, DPM  inFLIXimab (REMICADE) 100 MG injection Inject 300 mg into the vein. Hasn't started yet 04/14/16   [provider]  methocarbamol (ROBAXIN) 500 MG tablet Take 1 tablet (500 mg total) by mouth every 6 (six) hours as needed for muscle spasms. Patient not taking: Reported on 12/21/2018 02/02/15   Mcarthur Rossetti, MD  rivaroxaban Alveda Reasons) 10 MG TABS tablet Take 10 mg by mouth daily with breakfast. 06/10/11 07/19/11  Mcarthur Rossetti, MD    Family History Family History  Problem Relation Age of Onset  . Cancer Mother        ?  Marland Kitchen Hypertension Mother   . Heart failure Father   . Cancer Brother   . Anesthesia  problems Neg Hx   . Colon cancer Neg Hx        mother had "3" types of CA- unsure about which one  . Esophageal cancer Neg Hx   . Rectal cancer Neg Hx   . Stomach cancer Neg Hx     Social History Social History   Tobacco Use  . Smoking status: Former Smoker    Types: Cigarettes    Quit date: 04/25/2004    Years since quitting: 14.6  . Smokeless tobacco: Never Used  Substance Use Topics  . Alcohol use: No  . Drug use: No     Allergies   Patient has no known allergies.   Review of Systems Review of Systems  Constitutional: Negative for appetite change, chills and fever.  Respiratory: Negative for shortness of breath.   Cardiovascular: Negative for chest pain.  Gastrointestinal: Negative for abdominal pain, diarrhea, nausea and vomiting.  Genitourinary: Negative for dysuria.  Musculoskeletal: Positive for arthralgias and myalgias. Negative for back pain.  Skin: Positive for wound. Negative for rash.  Allergic/Immunologic: Negative  for immunocompromised state.  Neurological: Negative for seizures, syncope, weakness and headaches.  Psychiatric/Behavioral: Negative for confusion.   Physical Exam Updated Vital Signs BP 113/76   Pulse 92   Temp 98.7 F (37.1 C)   Resp 15   Ht 5\' 3"  (1.6 m)   Wt 77.1 kg   SpO2 96%   BMI 30.11 kg/m   Physical Exam Vitals signs and nursing note reviewed.  Constitutional:      Appearance: He is well-developed.  HENT:     Head: Normocephalic.  Eyes:     Conjunctiva/sclera: Conjunctivae normal.  Neck:     Musculoskeletal: Neck supple.  Cardiovascular:     Rate and Rhythm: Normal rate and regular rhythm.     Heart sounds: No murmur.  Pulmonary:     Effort: Pulmonary effort is normal.  Abdominal:     General: There is no distension.     Palpations: Abdomen is soft.  Musculoskeletal:     Comments: There is a large ulceration noted to the forefoot of the left foot.  He is diffusely tender to all digits of the left foot and diffusely to the dorsum of the left foot. NVI.   Skin:    General: Skin is warm and dry.  Neurological:     Mental Status: He is alert.  Psychiatric:        Behavior: Behavior normal.    Dorsum of left forefoot   Dorsum of left forefoot   Medial aspect of left foot     ED Treatments / Results  Labs (all labs ordered are listed, but only abnormal results are displayed) Labs Reviewed  CBC WITH DIFFERENTIAL/PLATELET - Abnormal; Notable for the following components:      Result Value   Hemoglobin 10.5 (*)    HCT 34.4 (*)    MCV 75.8 (*)    MCH 23.1 (*)    RDW 15.6 (*)    Abs Immature Granulocytes 0.10 (*)    All other components within normal limits  COMPREHENSIVE METABOLIC PANEL - Abnormal; Notable for the following components:   Glucose, Bld 102 (*)    Calcium 10.7 (*)    Total Protein 8.5 (*)    All other components within normal limits  SEDIMENTATION RATE - Abnormal; Notable for the following components:   Sed Rate 101 (*)    All  other components within normal limits  C-REACTIVE PROTEIN - Abnormal; Notable  for the following components:   CRP 14.0 (*)    All other components within normal limits  SARS CORONAVIRUS 2 (TAT 6-12 HRS)  HIV ANTIBODY (ROUTINE TESTING W REFLEX)    EKG None  Radiology Dg Foot Complete Left  Result Date: 12/21/2018 CLINICAL DATA:  Foot wound EXAM: LEFT FOOT - COMPLETE 3+ VIEW COMPARISON:  12/13/2018, 11/21/2018, 08/22/2018, 05/03/2017, 01/17/2017 FINDINGS: No acute displaced fracture is visualized. Lateral deviation at the MTP joints as before. Erosive arthropathy involving the MTP and IP joints as before. Intertarsal erosive changes as well. No soft tissue emphysema. Dorsal spurring. Possible calcified loose body anterior to the ankle. Small plantar calcaneal spur. 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 No definite acute interval osseous changes. Electronically Signed   By: Donavan Foil M.D.   On: 12/21/2018 02:31    Procedures Procedures (including critical care time)  Medications Ordered in ED Medications  oxyCODONE-acetaminophen (PERCOCET/ROXICET) 5-325 MG per tablet 1-2 tablet (has no administration in time range)  ciprofloxacin (CIPRO) tablet 500 mg (has no administration in time range)  sulfamethoxazole-trimethoprim (BACTRIM DS) 800-160 MG per tablet 1 tablet (has no administration in time range)  atorvastatin (LIPITOR) tablet 40 mg (has no administration in time range)  gabapentin (NEURONTIN) capsule 300 mg (has no administration in time range)  loratadine (CLARITIN) tablet 10 mg (has no administration in time range)  fluticasone (FLONASE) 50 MCG/ACT nasal spray 1 spray (has no administration in time range)  heparin injection 5,000 Units (5,000 Units Subcutaneous Given 12/21/18 0634)  acetaminophen (TYLENOL) tablet 650 mg (has no administration in time range)    Or  acetaminophen (TYLENOL) suppository 650 mg (has no  administration in time range)  oxyCODONE-acetaminophen (PERCOCET/ROXICET) 5-325 MG per tablet 1 tablet (1 tablet Oral Given 12/21/18 0220)     Initial Impression / Assessment and Plan / ED Course  I have reviewed the triage vital signs and the nursing notes.  Pertinent labs & imaging results that were available during my care of the patient were reviewed by me and considered in my medical decision making (see chart for details).        58 year old male with a history of rheumatoid arthritis hyperlipidemia, and chronic back pain presenting with a wound to the dorsum of his left foot that has been worsening over the last 3 weeks.  He has been on ciprofloxacin and Bactrim since 8/24.  He states that his home health nurse came to redress his wound yesterday and was concerned about the appearance so he was sent to the ER for further evaluation.  He denies fever or chills.  Vital signs are normal in the ER.  He has no leukocytosis.  On exam, he has diffusely tender to the toes of the left foot as well as the entire dorsum of the left foot.  X-ray with chronic erosive arthropathy, but unable to definitively address acute interval osseous changes.  CRP and sed rate are elevated, but the patient does have a history of rheumatoid arthritis.  Given worsening pain in the setting of antibiotics, the patient would benefit from an MRI to assess for osteomyelitis.   Consult to the hospitalist team and spoke with Dr. Marlowe Sax who will accept the patient for admission. The patient appears reasonably stabilized for admission considering the current resources, flow, and capabilities available in the ED at this time, and I doubt any other [facility] requiring further screening and/or treatment in the ED prior to admission.  Final Clinical Impressions(s) / ED Diagnoses   Final diagnoses:  Ulcer of left foot, unspecified ulcer stage [facility])    ED Discharge Orders    None       Joanne Gavel, PA-C 12/21/18 0926     Mesner, Corene Cornea, MD 12/22/18 [phone]

## 2018-12-21 NOTE — ED Notes (Signed)
ED TO INPATIENT HANDOFF REPORT  Name/Age/Gender Jonathan [name] 58 y.o. male  Code Status    Code Status Orders  (From admission, onward)         Start     Ordered   12/21/18 0401  Full code  Continuous     12/21/18 0402        Code Status History    Date Active Date Inactive Code Status Order ID Comments User Context   01/29/2015 1642 02/02/2015 2128 Full Code [id number]  Mcarthur Rossetti, MD Inpatient   11/11/2014 1846 11/14/2014 1830 Full Code [id number]  Mcarthur Rossetti, MD Inpatient   04/01/2013 1015 04/04/2013 2059 Full Code [id number]  Loralie Champagne., PA-C Inpatient   06/07/2011 1808 06/10/2011 1951 Full Code [id number]  Rande Lawman, RN Inpatient   Advance Care Planning Activity      Home/SNF/Other Home  Chief Complaint Ulcer  Level of Care/Admitting Diagnosis ED Disposition    ED Disposition Condition [facility]: [facility] [id number]  Level of Care: Med-Surg [16]  Covid Evaluation: Asymptomatic Screening Protocol (No Symptoms)  Diagnosis: Foot ulcer [facility]) [id number]  Admitting Physician: Shela Leff [id number]  Attending Physician: Shela Leff [id number]  PT Class (Do Not Modify): Observation [104]  PT Acc Code (Do Not Modify): Observation [10022]       Medical History Past Medical History:  Diagnosis Date  . Anemia    H/o using iron in the past   . Arthritis    rheumatoid  . Chronic back pain   . Hyperlipidemia   . Joint pain   . Joint swelling   . Rheumatoid arthritis(714.0)     Allergies No Known Allergies  IV Location/Drains/Wounds Patient Lines/Drains/Airways Status   Active Line/Drains/Airways    Name:   Placement date:   Placement time:   Site:   Days:   Peripheral IV 12/21/18 Left Antecubital   12/21/18    0221    Antecubital   less than 1   External Urinary Catheter   11/12/14    2037    -   1500   Incision 06/07/11 Knee Right   06/07/11    1322     2754   Incision (Closed) 11/11/14 Hip Right   11/11/14    1442     1501   Incision (Closed) 01/29/15 Hip   01/29/15    1142     1422   Wound / Incision (Open or Dehisced) 12/21/18 Non-pressure wound Foot Left;Anterior   12/21/18    0224    Foot   less than 1   Wound / Incision (Open or Dehisced) 12/21/18 Non-pressure wound Ankle Medial   12/21/18    0227    Ankle   less than 1          Labs/Imaging Results for orders placed or performed during the hospital encounter of 12/21/18 (from the past 48 hour(s))  CBC with Differential     Status: Abnormal   Collection Time: 12/21/18  1:49 AM  Result Value Ref Range   WBC 6.3 4.0 - 10.5 K/uL   RBC 4.54 4.22 - 5.81 MIL/uL   Hemoglobin 10.5 (L) 13.0 - 17.0 g/dL   HCT 34.4 (L) 39.0 - 52.0 %   MCV 75.8 (L) 80.0 - 100.0 fL   MCH 23.1 (L) 26.0 - 34.0 pg   MCHC 30.5 30.0 - 36.0 g/dL   RDW 15.6 (H) 11.5 - 15.5 %   Platelets  352 150 - 400 K/uL   nRBC 0.0 0.0 - 0.2 %   Neutrophils Relative % 62 %   Neutro Abs 4.0 1.7 - 7.7 K/uL   Lymphocytes Relative 15 %   Lymphs Abs 0.9 0.7 - 4.0 K/uL   Monocytes Relative 13 %   Monocytes Absolute 0.8 0.1 - 1.0 K/uL   Eosinophils Relative 7 %   Eosinophils Absolute 0.4 0.0 - 0.5 K/uL   Basophils Relative 1 %   Basophils Absolute 0.0 0.0 - 0.1 K/uL   Immature Granulocytes 2 %   Abs Immature Granulocytes 0.10 (H) 0.00 - 0.07 K/uL    Comment: Performed at [facility], [place] [address], [place], [place] [address]  Comprehensive metabolic panel     Status: Abnormal   Collection Time: 12/21/18  1:49 AM  Result Value Ref Range   Sodium 137 135 - 145 mmol/L   Potassium 3.6 3.5 - 5.1 mmol/L   Chloride 104 98 - 111 mmol/L   CO2 22 22 - 32 mmol/L   Glucose, Bld 102 (H) 70 - 99 mg/dL   BUN 8 6 - 20 mg/dL   Creatinine, Ser 0.72 0.61 - 1.24 mg/dL   Calcium 10.7 (H) 8.9 - 10.3 mg/dL   Total Protein 8.5 (H) 6.5 - 8.1 g/dL   Albumin 3.5 3.5 - 5.0 g/dL   AST 34 15 - 41 U/L   ALT 23 0 - 44 U/L   Alkaline  Phosphatase 90 38 - 126 U/L   Total Bilirubin 0.6 0.3 - 1.2 mg/dL   GFR calc non Af Amer >60 >60 mL/min   GFR calc Af Amer >60 >60 mL/min   Anion gap 11 5 - 15    Comment: Performed at [facility], [place] [address] [place], [place] [address]  Sedimentation rate     Status: Abnormal   Collection Time: 12/21/18  1:49 AM  Result Value Ref Range   Sed Rate 101 (H) 0 - 16 mm/hr    Comment: Performed at [facility], [place] [address], [place], [place] [address]  C-reactive protein     Status: Abnormal   Collection Time: 12/21/18  1:49 AM  Result Value Ref Range   CRP 14.0 (H) <1.0 mg/dL    Comment: Performed at [facility], [place] [address], [place], [place] [address]   Dg Foot Complete Left  Result Date: 12/21/2018 CLINICAL DATA:  Foot wound EXAM: LEFT FOOT - COMPLETE 3+ VIEW COMPARISON:  12/13/2018, 11/21/2018, 08/22/2018, 05/03/2017, 01/17/2017 FINDINGS: No acute displaced fracture is visualized. Lateral deviation at the MTP joints as before. Erosive arthropathy involving the MTP and IP joints as before. Intertarsal erosive changes as well. No soft tissue emphysema. Dorsal spurring. Possible calcified loose body anterior to the ankle. Small plantar calcaneal spur. 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 No definite acute interval osseous changes. Electronically Signed   By: Donavan Foil M.D.   On: 12/21/2018 02:31    Pending Labs Unresulted Labs (From admission, onward)    Start     Ordered   12/21/18 0401  HIV antibody (Routine Testing)  Once,   STAT     12/21/18 0402   12/21/18 0315  SARS CORONAVIRUS 2 (TAT 6-12 HRS) Nasal Swab Aptima Multi Swab  (Asymptomatic/Tier 2 Patients Labs)  Once,   STAT    Question Answer Comment  Is this test for diagnosis or screening Screening   Symptomatic  for COVID-19 as defined by CDC No   Hospitalized for COVID-19 No   Admitted to ICU for  COVID-19 No   Previously tested for COVID-19 No   Resident in a congregate (group) care setting No   Employed in healthcare setting No      12/21/18 0314          Vitals/Pain Today's Vitals   12/21/18 1030 12/21/18 1100 12/21/18 1124 12/21/18 1125  BP: 123/77 124/80    Pulse: (!) 104 (!) 102    Resp:      Temp:   99 F (37.2 C)   TempSrc:   Oral   SpO2: 93% 91%    Weight:      Height:      PainSc:    0-No pain    Isolation Precautions No active isolations  Medications Medications  oxyCODONE-acetaminophen (PERCOCET/ROXICET) 5-325 MG per tablet 1-2 tablet (has no administration in time range)  ciprofloxacin (CIPRO) tablet 500 mg (500 mg Oral Given 12/21/18 1037)  sulfamethoxazole-trimethoprim (BACTRIM DS) 800-160 MG per tablet 1 tablet (1 tablet Oral Given 12/21/18 1037)  atorvastatin (LIPITOR) tablet 40 mg (40 mg Oral Given 12/21/18 1036)  gabapentin (NEURONTIN) capsule 300 mg (300 mg Oral Given 12/21/18 1038)  loratadine (CLARITIN) tablet 10 mg (10 mg Oral Given 12/21/18 1038)  fluticasone (FLONASE) 50 MCG/ACT nasal spray 1 spray (1 spray Each Nare Not Given 12/21/18 1100)  heparin injection 5,000 Units (5,000 Units Subcutaneous Given 12/21/18 0634)  acetaminophen (TYLENOL) tablet 650 mg (has no administration in time range)    Or  acetaminophen (TYLENOL) suppository 650 mg (has no administration in time range)  oxyCODONE-acetaminophen (PERCOCET/ROXICET) 5-325 MG per tablet 1 tablet (1 tablet Oral Given 12/21/18 0220)    Mobility manual wheelchair

## 2018-12-21 NOTE — Consult Note (Signed)
[facility] Nurse wound consult note Reason for Consult: Left anterior and medial foot ulcerations. Patient followed by [facility] and [facility] and has been seen by both Drs. Evans and [employer]. These wounds are on the non-operative foot and developed after surgery. Last seen by Dr. Amalia Hailey on 12/17/18. Dr. Amalia Hailey recommended referral to outpatient wound care center [facility]) at [facility] according to his note. I am not sure if the referral was made. Patient recently stopped smoking.  Wound type: Infectious  Pressure Injury POA: N/A Measurement: Left Anterior foot:  4cm x 4cm x 0.2xm with red and yellow wound bed and serous exudate Left Medial foot: 1.2cm x 2.4cm x 0.1cm with red wound bed, serous exudate and macerated periwound tissue Wound bed:As described above Drainage (amount, consistency, odor) As described above Periwound:As described above Dressing procedure/placement/frequency:  Topical therapy guidance provided for Nursing using layer of Mepitel on anterior foot wound topped with silver hydrofiber dressing. The silicone nonadherent is needed  The medial foot will just use silver hydrofiber dressing.  Both will be secured with a Few turns of Kerlix roll gauze/paper tape and changed daily while in house.  May decrease to every-other-day or M/W/F upon discharge.    Recommend continuing [place] for dressing changes and ongoing assessment after discharge until patient can be seen in [place] as an outpatient.  [place] nursing team will not follow, but will remain available to this patient, the nursing and medical teams.  Please re-consult if needed. Thanks, Maudie Flakes, MSN, RN, [place], Arther Abbott  Pager# [phone]

## 2018-12-22 ENCOUNTER — Observation Stay (HOSPITAL_COMMUNITY): Payer: Medicare Other

## 2018-12-22 DIAGNOSIS — Z96643 Presence of artificial hip joint, bilateral: Secondary | ICD-10-CM | POA: Diagnosis present

## 2018-12-22 DIAGNOSIS — Z8249 Family history of ischemic heart disease and other diseases of the circulatory system: Secondary | ICD-10-CM | POA: Diagnosis not present

## 2018-12-22 DIAGNOSIS — Z114 Encounter for screening for human immunodeficiency virus [HIV]: Secondary | ICD-10-CM | POA: Diagnosis not present

## 2018-12-22 DIAGNOSIS — M154 Erosive (osteo)arthritis: Secondary | ICD-10-CM | POA: Diagnosis present

## 2018-12-22 DIAGNOSIS — D649 Anemia, unspecified: Secondary | ICD-10-CM | POA: Diagnosis present

## 2018-12-22 DIAGNOSIS — Z96653 Presence of artificial knee joint, bilateral: Secondary | ICD-10-CM | POA: Diagnosis present

## 2018-12-22 DIAGNOSIS — G8929 Other chronic pain: Secondary | ICD-10-CM | POA: Diagnosis present

## 2018-12-22 DIAGNOSIS — Z79899 Other long term (current) drug therapy: Secondary | ICD-10-CM | POA: Diagnosis not present

## 2018-12-22 DIAGNOSIS — L97529 Non-pressure chronic ulcer of other part of left foot with unspecified severity: Secondary | ICD-10-CM | POA: Diagnosis present

## 2018-12-22 DIAGNOSIS — Z7901 Long term (current) use of anticoagulants: Secondary | ICD-10-CM | POA: Diagnosis not present

## 2018-12-22 DIAGNOSIS — Z79891 Long term (current) use of opiate analgesic: Secondary | ICD-10-CM | POA: Diagnosis not present

## 2018-12-22 DIAGNOSIS — L24A9 Irritant contact dermatitis due friction or contact with other specified body fluids: Secondary | ICD-10-CM | POA: Diagnosis present

## 2018-12-22 DIAGNOSIS — Z87891 Personal history of nicotine dependence: Secondary | ICD-10-CM | POA: Diagnosis not present

## 2018-12-22 DIAGNOSIS — T148XXA Other injury of unspecified body region, initial encounter: Secondary | ICD-10-CM | POA: Diagnosis present

## 2018-12-22 DIAGNOSIS — M069 Rheumatoid arthritis, unspecified: Secondary | ICD-10-CM | POA: Diagnosis present

## 2018-12-22 DIAGNOSIS — Z7951 Long term (current) use of inhaled steroids: Secondary | ICD-10-CM | POA: Diagnosis not present

## 2018-12-22 DIAGNOSIS — E785 Hyperlipidemia, unspecified: Secondary | ICD-10-CM | POA: Diagnosis present

## 2018-12-22 DIAGNOSIS — Z20828 Contact with and (suspected) exposure to other viral communicable diseases: Secondary | ICD-10-CM | POA: Diagnosis present

## 2018-12-22 LAB — CBC
HCT: 31 % — ABNORMAL LOW (ref 39.0–52.0)
Hemoglobin: 9.5 g/dL — ABNORMAL LOW (ref 13.0–17.0)
MCH: 23.1 pg — ABNORMAL LOW (ref 26.0–34.0)
MCHC: 30.6 g/dL (ref 30.0–36.0)
MCV: 75.2 fL — ABNORMAL LOW (ref 80.0–100.0)
Platelets: 336 10*3/uL (ref 150–400)
RBC: 4.12 MIL/uL — ABNORMAL LOW (ref 4.22–5.81)
RDW: 15.8 % — ABNORMAL HIGH (ref 11.5–15.5)
WBC: 4.6 10*3/uL (ref 4.0–10.5)
nRBC: 0 % (ref 0.0–0.2)

## 2018-12-22 LAB — BASIC METABOLIC PANEL
Anion gap: 6 (ref 5–15)
BUN: 12 mg/dL (ref 6–20)
CO2: 25 mmol/L (ref 22–32)
Calcium: 10 mg/dL (ref 8.9–10.3)
Chloride: 104 mmol/L (ref 98–111)
Creatinine, Ser: 0.82 mg/dL (ref 0.61–1.24)
GFR calc Af Amer: >60 mL/min (ref >60)
GFR calc non Af Amer: >60 mL/min (ref >60)
Glucose, Bld: 130 mg/dL — ABNORMAL HIGH (ref 70–99)
Potassium: 3.6 mmol/L (ref 3.5–5.1)
Sodium: 135 mmol/L (ref 135–145)

## 2018-12-22 LAB — HIV ANTIBODY (ROUTINE TESTING W REFLEX): HIV Screen 4th Generation wRfx: NONREACTIVE

## 2018-12-22 MED ORDER — GADOBUTROL 1 MMOL/ML IV SOLN
7.5000 mL | Freq: Once | INTRAVENOUS | Status: AC | PRN
  Administered 2018-12-22: 18:00:00 7.5 mL via INTRAVENOUS

## 2018-12-22 NOTE — Care Management Obs Status (Signed)
[facility] NOTIFICATION   Patient Details  Name: Owin Hurney MRN: [id number] Date of Birth: April 26, 1960   Medicare Observation Status Notification Given:  Yes    Joaquin Courts, RN 12/22/2018, 12:31 PM

## 2018-12-22 NOTE — Progress Notes (Signed)
PROGRESS NOTE    Jonathan [name]  FOY:[id number] DOB: 05-26-1960 DOA: 12/21/2018 PCP: Sinclair Ship, MD     Brief Narrative:  Jonathan [name] a 58 y.o.malewith medical history significant ofanemia, rheumatoid arthritis, hyperlipidemia being sent to the hospital by podiatry for evaluation of left foot ulcer. Patient is followed by Dr. Amalia Hailey with podiatry and underwent right forefoot reconstruction secondary to an acquired hammertoe on June 25. He was seen by podiatry on 8/24 for his left foot ulcer and prescribed a 10-day course of Bactrim and Cipro. Patient states he noticed a little wound on his foot about 3 weeks ago and since then it has been getting bigger in size and the area is painful. States he has a home health nurse who visits every other day and noticed that his wound has been draining puslike material. Denies fevers or chills. States he has been taking Bactrim and Cipro which were recently prescribed. No other complaints. ESR 101, CRP 14. 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 COVID negative.   New events last 24 hours / Subjective: No new complaints or issues overnight. Afebrile.   Assessment & Plan:   Principal Problem:   Foot ulcer ([place]) Active Problems:   HLD (hyperlipidemia)   Encounter for screening for HIV   Wound drainage   Left dorsal foot ulcer -MRI foot pending to rule out osteomyelitis.  If positive, will get podiatry involved -Patient was started on 10 day course cipro/bactrim on 8/24 by Podiatry  -Wound RN consulted. Recommend HHRN for dressing changes and follow up in [place] as outpatient.   HLD -Continue lipitor        DVT prophylaxis: Subq hep Code Status: Full Family Communication: None Disposition Plan: Pending MRI results   Consultants:   None  Procedures:   None   Antimicrobials:  Anti-infectives (From admission, onward)   Start      Dose/Rate Route Frequency Ordered Stop   12/21/18 1000  ciprofloxacin (CIPRO) tablet 500 mg     500 mg Oral 2 times daily 12/21/18 0402     12/21/18 1000  sulfamethoxazole-trimethoprim (BACTRIM DS) 800-160 MG per tablet 1 tablet     1 tablet Oral 2 times daily 12/21/18 0402          Objective: Vitals:   12/21/18 1833 12/21/18 2116 12/22/18 0602 12/22/18 1402  BP: 123/78 119/80 124/85 117/78  Pulse: (!) 107 91 (!) 101 98  Resp: _0 Temp: 98.9 F (37.2 C) 98.3 F (36.8 C) 99 F (37.2 C) 99.4 F (37.4 C)  TempSrc: Oral Oral Oral Oral  SpO2: 94% 92% 100% 97%  Weight:      Height:        Intake/Output Summary (Last 24 hours) at 12/22/2018 1850 Last data filed at 12/21/2018 2300 Gross per 24 hour  Intake -  Output 375 ml  Net -375 ml   Filed Weights   12/20/18 1920  Weight: 77.1 kg    Examination:  General exam: Appears calm and comfortable  Respiratory system: Clear to auscultation. Respiratory effort normal. No respiratory distress. No conversational dyspnea.  Cardiovascular system: S1 & S2 heard, RRR. No murmurs. No pedal edema. Gastrointestinal system: Abdomen is nondistended, soft and nontender. Normal bowel sounds heard. Central nervous system: Alert and oriented. No focal neurological deficits. Speech clear.  Extremities: Symmetric in appearance  Skin: Left foot wound in clean and dry dressing  Psychiatry: Judgement and  insight appear normal. Mood & affect appropriate.   Data Reviewed: I have personally reviewed following labs and imaging studies  CBC: Recent Labs  Lab 12/21/18 0149 12/22/18 0233  WBC 6.3 4.6  NEUTROABS 4.0  --   HGB 10.5* 9.5*  HCT 34.4* 31.0*  MCV 75.8* 75.2*  PLT 352 283   Basic Metabolic Panel: Recent Labs  Lab 12/21/18 0149 12/22/18 0233  NA 137 135  K 3.6 3.6  CL 104 104  CO2 22 25  GLUCOSE 102* 130*  BUN 8 12  CREATININE 0.72 0.82  CALCIUM 10.7* 10.0   GFR: Estimated Creatinine Clearance: 91.4 mL/min (by C-G  formula based on SCr of 0.82 mg/dL). Liver Function Tests: Recent Labs  Lab 12/21/18 0149  AST 34  ALT 23  ALKPHOS 90  BILITOT 0.6  PROT 8.5*  ALBUMIN 3.5   No results for input(s): LIPASE, AMYLASE in the last 168 hours. No results for input(s): AMMONIA in the last 168 hours. Coagulation Profile: No results for input(s): INR, PROTIME in the last 168 hours. Cardiac Enzymes: No results for input(s): CKTOTAL, CKMB, CKMBINDEX, TROPONINI in the last 168 hours. BNP (last 3 results) No results for input(s): PROBNP in the last 8760 hours. HbA1C: No results for input(s): HGBA1C in the last 72 hours. CBG: No results for input(s): GLUCAP in the last 168 hours. Lipid Profile: No results for input(s): CHOL, HDL, LDLCALC, TRIG, CHOLHDL, LDLDIRECT in the last 72 hours. Thyroid Function Tests: No results for input(s): TSH, T4TOTAL, FREET4, T3FREE, THYROIDAB in the last 72 hours. Anemia Panel: No results for input(s): VITAMINB12, FOLATE, FERRITIN, TIBC, IRON, RETICCTPCT in the last 72 hours. Sepsis Labs: No results for input(s): PROCALCITON, LATICACIDVEN in the last 168 hours.  Recent Results (from the past 240 hour(s))  SARS CORONAVIRUS 2 (TAT 6-12 HRS) Nasal Swab Aptima Multi Swab     Status: None   Collection Time: 12/21/18  3:15 AM   Specimen: Aptima Multi Swab; Nasal Swab  Result Value Ref Range Status   SARS Coronavirus 2 NEGATIVE NEGATIVE Final    Comment: (NOTE) SARS-CoV-2 target nucleic acids are NOT DETECTED. The SARS-CoV-2 RNA is generally detectable in upper and lower respiratory specimens during the acute phase of infection. Negative results do not preclude SARS-CoV-2 infection, do not rule out co-infections with other pathogens, and should not be used as the sole basis for treatment or other patient management decisions. Negative results must be combined with clinical observations, patient history, and epidemiological information. The expected result is Negative. Fact  Sheet for Patients: SugarRoll.be Fact Sheet for Healthcare Providers: https://www.woods-mathews.com/ This test is not yet approved or cleared by the [place] FDA and  has been authorized for detection and/or diagnosis of SARS-CoV-2 by FDA under an Emergency Use Authorization (EUA). This EUA will remain  in effect (meaning this test can be used) for the duration of the COVID-19 declaration under Section 56 4(b)(1) of the Act, 21 U.S.C. section 360bbb-3(b)(1), unless the authorization is terminated or revoked sooner. Performed at [facility], [place] [address] [place], [place] [address]       Radiology Studies: Dg Foot Complete Left  Result Date: 12/21/2018 CLINICAL DATA:  Foot wound EXAM: LEFT FOOT - COMPLETE 3+ VIEW COMPARISON:  12/13/2018, 11/21/2018, 08/22/2018, 05/03/2017, 01/17/2017 FINDINGS: No acute displaced fracture is visualized. Lateral deviation at the MTP joints as before. Erosive arthropathy involving the MTP and IP joints as before. Intertarsal erosive changes as well. No soft tissue emphysema. Dorsal spurring. Possible  calcified loose body anterior to the ankle. Small plantar calcaneal spur. 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 No definite acute interval osseous changes. Electronically Signed   By: Donavan Foil M.D.   On: 12/21/2018 02:31      Scheduled Meds: . atorvastatin  40 mg Oral Daily  . ciprofloxacin  500 mg Oral BID  . fluticasone  1 spray Each Nare Daily  . gabapentin  300 mg Oral BID  . heparin  5,000 Units Subcutaneous Q8H  . loratadine  10 mg Oral Daily  . sulfamethoxazole-trimethoprim  1 tablet Oral BID   Continuous Infusions:   LOS: 0 days      Time spent: 25  minutes   Dessa Phi, DO [facility] [email or web address] 12/22/2018, 6:50 PM

## 2018-12-23 LAB — CBC
HCT: 32.7 % — ABNORMAL LOW (ref 39.0–52.0)
Hemoglobin: 10.1 g/dL — ABNORMAL LOW (ref 13.0–17.0)
MCH: 23.2 pg — ABNORMAL LOW (ref 26.0–34.0)
MCHC: 30.9 g/dL (ref 30.0–36.0)
MCV: 75.2 fL — ABNORMAL LOW (ref 80.0–100.0)
Platelets: 373 10*3/uL (ref 150–400)
RBC: 4.35 MIL/uL (ref 4.22–5.81)
RDW: 15.8 % — ABNORMAL HIGH (ref 11.5–15.5)
WBC: 6.3 10*3/uL (ref 4.0–10.5)
nRBC: 0 % (ref 0.0–0.2)

## 2018-12-23 NOTE — Progress Notes (Signed)
Pt stable at this time awaiting ride home. Pt wound changed prior to d/c home. rn applied walking boot and walking shoe prior to d/c. No needs at this time. No questions on d/c instructions and education given.

## 2018-12-23 NOTE — TOC Initial Note (Signed)
Transition of Care [facility]) - Initial/Assessment Note    Patient Details  Name: Jonathan [name] MRN: [id number] Date of Birth: [birth year]  Transition of Care (TOC) CM/SW Contact:    Joaquin Courts, RN Phone Number: 12/23/2018, 10:27 AM  Clinical Narrative: CM spoke with patient who reports he is active with [facility] agency prior to admit. CM confirmed patient is active with [facility] who will resume services at discharge.                  Expected Discharge Plan: [place] Barriers to Discharge: No Barriers Identified   Patient Goals and CMS Choice Patient states their goals for this hospitalization and ongoing recovery are:: to go home      Expected Discharge Plan and Services Expected Discharge Plan: [place]   Discharge Planning Services: CM Consult Post Acute Care Choice: [place] arrangements for the past 2 months: Single Family Home Expected Discharge Date: 12/23/18               DME Arranged: N/A DME Agency: NA       HH Arranged: RN [place] Agency: [facility] [facility] [employer] Date [place]: 12/23/18 Time [place]: 1026 Representative spoke with at [place]: Alwyn Ren  Prior Living Arrangements/Services Living arrangements for the past 2 months: [place]   Patient language and need for interpreter reviewed:: Yes Do you feel safe going back to the place where you live?: Yes      Need for Family Participation in Patient Care: Yes (Comment) Care giver support system in place?: Yes (comment) Current home services: Home RN Criminal Activity/Legal Involvement Pertinent to Current Situation/Hospitalization: No - Comment as needed  Activities of Daily Living Home Assistive Devices/Equipment: Eyeglasses, Other (Comment)(single point cane, cam boot for left and walking show for right) ADL Screening (condition at time of admission) Patient's cognitive ability adequate to safely  complete daily activities?: Yes Is the patient deaf or have difficulty hearing?: No Does the patient have difficulty seeing, even when wearing glasses/contacts?: No Does the patient have difficulty concentrating, remembering, or making decisions?: No Patient able to express need for assistance with ADLs?: Yes Does the patient have difficulty dressing or bathing?: No Independently performs ADLs?: Yes (appropriate for developmental age) Does the patient have difficulty walking or climbing stairs?: Yes(secondary to foot problems) Weakness of Legs: Both Weakness of Arms/Hands: None  Permission Sought/Granted   Permission granted to share information with : Yes, Verbal Permission Granted     Permission granted to share info w AGENCY: [facility]        Emotional Assessment Appearance:: Appears stated age Attitude/Demeanor/Rapport: Engaged Affect (typically observed): Accepting Orientation: : Oriented to  Time, Oriented to Situation, Oriented to Place, Oriented to Self   Psych Involvement: No (comment)  Admission diagnosis:  Ulcer of left foot, unspecified ulcer stage [facility]) [L97.529] Patient Active Problem List   Diagnosis Date Noted  . Wound drainage 12/22/2018  . Foot ulcer ([place]) 12/21/2018  . Encounter for screening for HIV 12/21/2018  . Arthritis 08/22/2018  . HLD (hyperlipidemia) 08/22/2018  . Lung nodule 08/22/2018  . Renal cyst, right 08/22/2018  . Rheumatoid arthritis involving multiple sites with positive rheumatoid factor ([place]) 08/22/2018  . Venous insufficiency 08/22/2018  . Vitamin D deficiency 08/22/2018  . Seasonal allergic rhinitis 07/16/2018  . Essential hypertension 02/23/2018  . Insomnia 02/23/2018  . Tinea versicolor 02/23/2018  . Antalgic gait 09/21/2017  .  Chronic pain of both knees 09/21/2017  . Foot pain, bilateral 09/21/2017  . Elevated liver enzymes 07/10/2017  . Leg edema 06/08/2017  . Primary osteoarthritis, left ankle and foot 05/09/2017  .  Post-traumatic osteoarthritis, right ankle and foot 05/09/2017  . Primary osteoarthritis, right ankle and foot 05/09/2017  . Erectile dysfunction 02/15/2017  . Excessive sweating 02/15/2017  . Difficulty transferring 01/27/2017  . Acquired bilateral flat feet 11/15/2016  . Other acquired hammer toe 11/15/2016  . Erosive gastritis 09/05/2016  . At high risk for falls 08/15/2016  . Chronic bilateral low back pain without sciatica 10/13/2015  . Drug therapy 08/03/2015  . Rheumatoid arthritis involving left hip ([place]) 01/29/2015  . 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 Status post total replacement of right hip 11/11/2014  . Gait difficulty 08/21/2013  . Benign neoplasm of colon 04/03/2013  . Reflux esophagitis 04/03/2013  . Microcytic anemia 04/01/2013  . Chronic ulcer of right leg ([place]) 01/13/2013  . Pressure ulcer 01/13/2013  . 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 Degenerative arthritis of knee 06/07/2011   PCP:  Sinclair Ship, MD Pharmacy:   [id number] - [place], [place] [address] [address] Phone: [phone] Fax: [phone]     Social Determinants of Health (SDOH) Interventions    Readmission Risk Interventions No flowsheet data found.

## 2018-12-23 NOTE — Plan of Care (Signed)
Pt to d/c home this afternoon.

## 2018-12-23 NOTE — Progress Notes (Signed)
    Home health agencies that serve [phone].        [place] Quality of Patient Care Rating Patient Survey Summary Rating  [facility] [phone] 4 out of 5 stars 4 out of [place] [phone] 4  out of 5 stars 3 out of [place] [phone] 4 out of 5 stars 4 out of [place] [phone] 4 out of 5 stars 4 out of 5 stars  [facility] [place] [phone] 3  out of 5 stars 4 out of [place] [phone] 3 out of 5 stars 4 out of 5 stars  [facility] [phone] 4 out of 5 stars Not Available12  [facility] [phone] 3  out of 5 stars 3 out of [place] [phone] 3  out of 5 stars 4 out of [employer] [phone] 3  out of 5 stars 3 out of [place] [phone] 4  out of 5 stars 3 out of [place] number Footnote as displayed on [place]  1 This agency provides services under a federal waiver program to non-traditional, chronic long term population.  2 This agency provides services to a special needs population.  3 Not Available.  4 The number of patient episodes for this measure is too small to report.  5 This measure currently does not have data or provider has been certified/recertified for less than 6 months.  6 The national average for this measure is not provided because of state-to-state differences in data collection.  7 Medicare is not displaying rates for this measure for any home health agency, because of an issue with the data.  8 There were problems with the data and they are being corrected.  9 Zero, or very few, patients met the survey's rules for inclusion. The scores shown, if any, reflect a  very small number of surveys and may not accurately tell how an agency is doing.  10 Survey results are based on less than 12 months of data.  11 Fewer than 70 patients completed the survey. Use the scores shown, if any, with caution as the number of surveys may be too low to accurately tell how an agency is doing.  12 No survey results are available for this period.  13 Data suppressed by CMS for one or more quarters.

## 2018-12-23 NOTE — Discharge Summary (Signed)
Physician Discharge Summary  Jonathan [name] AYT:[id number] DOB: Jul 06, 1960 DOA: 12/21/2018  PCP: Sinclair Ship, MD  Admit date: 12/21/2018 Discharge date: 12/23/2018  Admitted From: Home Disposition:  Home  Recommendations for Outpatient Follow-up:  1. Follow up with PCP in 1 week 2. Follow up with Dr. Amalia Hailey as previously scheduled on 9/9 3. Follow up at the [facility] as referred. Referral faxed on 8/25 per chart review.   Wound type: Infectious  Pressure Injury POA: N/A Measurement: Left Anterior foot:  4cm x 4cm x 0.2xm with red and yellow wound bed and serous exudate Left Medial foot: 1.2cm x 2.4cm x 0.1cm with red wound bed, serous exudate and macerated periwound tissue Wound bed:As described above Drainage (amount, consistency, odor) As described above Periwound:As described above Dressing procedure/placement/frequency:  Topical therapy guidance provided for Nursing using layer of Mepitel on anterior foot wound topped with silver hydrofiber dressing. The silicone nonadherent is needed  The medial foot will just use silver hydrofiber dressing.  Both will be secured with a Few turns of Kerlix roll gauze/paper tape and changed daily while in house.  May decrease to every-other-day or M/W/F upon discharge.    Home Health: RN  Equipment/Devices: None   Discharge Condition: Stable CODE STATUS: Full  Diet recommendation: Heart healthy   Brief/Interim Summary: Jonathan [name] a 58 y.o.malewith medical history significant ofanemia, rheumatoid arthritis, hyperlipidemia being sent to the hospital by podiatry for evaluation of left foot ulcer. Patient is followed by Dr. Amalia Hailey with podiatry and underwent right forefoot reconstruction secondary to an acquired hammertoe on June 25. He was seen by podiatry on 8/24 for his left foot ulcer and prescribed a 10-day course of Bactrim and Cipro. Patient states he noticed a little wound on his foot about 3 weeks ago and since then it  has been getting bigger in size and the area is painful. States he has a home health nurse who visits every other day and noticed that his wound has been draining puslike material. Denies fevers or chills. States he has been taking Bactrim and Cipro which were recently prescribed. No other complaints. ESR 101, CRP 14. 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  MRI negative for osteomyelitis.   Discharge Diagnoses:  Principal Problem:   Foot ulcer ([place]) Active Problems:   HLD (hyperlipidemia)   Encounter for screening for HIV   Wound drainage   Left dorsal foot ulcer -MRI foot negative for osteomyelitis. -Patient remained afebrile with normal WBC  -Patient was started on 10 day course cipro/bactrim on 8/24 by Podiatry - continue on discharge  -Wound RN consulted. Recommend HHRN for dressing changes and follow up in [place] as outpatient.  HLD -Continue lipitor    Discharge Instructions  Discharge Instructions    Call MD for:  difficulty breathing, headache or visual disturbances   Complete by: As directed    Call MD for:  extreme fatigue   Complete by: As directed    Call MD for:  persistant dizziness or light-headedness   Complete by: As directed    Call MD for:  persistant nausea and vomiting   Complete by: As directed    Call MD for:  severe uncontrolled pain   Complete by: As directed    Call MD for:  temperature >100.4   Complete by: As directed    Diet - low sodium heart healthy   Complete by: As directed  Discharge instructions   Complete by: As directed    You were cared for by a hospitalist during your hospital stay. If you have any questions about your discharge medications or the care you received while you were in the hospital after you are discharged, you can call the unit and ask to speak with the hospitalist on call if the hospitalist that took care of you  is not available. Once you are discharged, your primary care physician will handle any further medical issues. Please note that NO REFILLS for any discharge medications will be authorized once you are discharged, as it is imperative that you return to your primary care physician (or establish a relationship with a primary care physician if you do not have one) for your aftercare needs so that they can reassess your need for medications and monitor your lab values.   Discharge wound care:   Complete by: As directed    Topical therapy guidance provided for Nursing using layer of Mepitel on anterior foot wound topped with silver hydrofiber dressing. The silicone nonadherent is needed  The medial foot will just use silver hydrofiber dressing.  Both will be secured with a Few turns of Kerlix roll gauze/paper tape and changed daily while in house.  May decrease to every-other-day or M/W/F upon discharge.   Increase activity slowly   Complete by: As directed      Allergies as of 12/23/2018   No Known Allergies     Medication List    STOP taking these medications   cephALEXin 500 MG capsule Commonly known as: KEFLEX   doxycycline 100 MG tablet Commonly known as: VIBRA-TABS   methocarbamol 500 MG tablet Commonly known as: ROBAXIN     TAKE these medications   atorvastatin 40 MG tablet Commonly known as: LIPITOR Take 40 mg by mouth daily.   cetirizine 10 MG tablet Commonly known as: ZYRTEC Take 10 mg by mouth daily.   ciprofloxacin 500 MG tablet Commonly known as: Cipro Take 1 tablet (500 mg total) by mouth 2 (two) times daily.   fluticasone 50 MCG/ACT nasal spray Commonly known as: FLONASE Place 1 spray into the nose daily.   gabapentin 300 MG capsule Commonly known as: NEURONTIN Take 300 mg by mouth 2 (two) times daily.   inFLIXimab 100 MG injection Commonly known as: REMICADE Inject 300 mg into the vein. Hasn't started yet   oxyCODONE-acetaminophen 5-325 MG tablet Commonly  known as: Percocet Take 1 tablet by mouth every 6 (six) hours as needed for severe pain.   sulfamethoxazole-trimethoprim 800-160 MG tablet Commonly known as: BACTRIM DS Take 1 tablet by mouth 2 (two) times daily.            Discharge Care Instructions  (From admission, onward)         Start     Ordered   12/23/18 0000  Discharge wound care:    Comments: Topical therapy guidance provided for Nursing using layer of Mepitel on anterior foot wound topped with silver hydrofiber dressing. The silicone nonadherent is needed  The medial foot will just use silver hydrofiber dressing.  Both will be secured with a Few turns of Kerlix roll gauze/paper tape and changed daily while in house.  May decrease to every-other-day or M/W/F upon discharge.   12/23/18 0810         Follow-up Information    Sinclair Ship, MD. Schedule an appointment as soon as possible for a visit in 1 week(s).   Specialty: Internal Medicine Contact  information: [address] [place] [address] [phone]        Edrick Kins, DPM. Go on 01/02/2019.   Specialty: Health visitor information: [address] [place] [address] [phone]        [facility]             . Call.   Why: Referral was sent 8/25 Contact information: [address] [address] [phone]         No Known Allergies  Consultations:  None    Procedures/Studies: Mr Foot Left W Wo Contrast  Result Date: 12/22/2018 CLINICAL DATA:  Left foot pain and draining ulcer for 3 weeks, enlarging. EXAM: MRI OF THE LEFT FOREFOOT WITHOUT AND WITH CONTRAST TECHNIQUE: A variety of multiplanar multisequence images were obtained through the forefoot and some scattered images obtained through the whole foot and other images obtained through the hindfoot. CONTRAST:  7.5 cc Gadavist COMPARISON:  Radiographs from 12/21/2018 FINDINGS:  Despite efforts by the technologist and patient, motion artifact is present on today's exam and could not be eliminated. This reduces exam sensitivity and specificity. Bones/Joint/Cartilage Subcortical edema at the tibiotalar joint appears chronic and with associated severe loss of articular cartilage and marginal spurring. Similarly, there is subcortical marrow edema at the first MTP joint which is probably from arthropathy. There is no joint effusion to suggest a septic joint, and there is abnormal hallux valgus and loss of articular space. Assessment of the toes is problematic due to very poor fat saturation on the T2 fat saturated images. The STIR images mostly include the toes and demonstrate no compelling findings for osteomyelitis. Considerable primarily degenerative arthropathy in the midfoot and along the Chopart joint as well as the Lisfranc Lisfranc joint. There is some subtle edema in the base of the fifth metatarsal, probably reactive or from arthropathy. Synovitis along the tibiotalar joint. Ligaments Today's exam is ill suited to ligamentous assessment due to field of view, motion artifact, and other factors. The Lisfranc ligament is intact. Muscles and Tendons Today's exam was not protocol for tendon assessment. No obvious tendinous discontinuity. Soft tissues Dorsal forefoot cutaneous ulceration and surrounding cellulitis for example on image 47/16, corresponding to the anterior wound. There is a separate ulceration medially along the foot in the vicinity of image 18/16. No drainable abscess. IMPRESSION: 1. No findings of osteomyelitis or drainable abscess. 2. Dorsal forefoot cutaneous ulceration and medial cutaneous ulceration with surrounding cellulitis. 3. Arthropathy in the hindfoot, midfoot, and forefoot. Electronically Signed   By: Van Clines M.D.   On: 12/22/2018 19:00   Dg Foot Complete Left  Result Date: 12/21/2018 CLINICAL DATA:  Foot wound EXAM: LEFT FOOT - COMPLETE 3+ VIEW  COMPARISON:  12/13/2018, 11/21/2018, 08/22/2018, 05/03/2017, 01/17/2017 FINDINGS: No acute displaced fracture is visualized. Lateral deviation at the MTP joints as before. Erosive arthropathy involving the MTP and IP joints as before. Intertarsal erosive changes as well. No soft tissue emphysema. Dorsal spurring. Possible calcified loose body anterior to the ankle. Small plantar calcaneal spur. 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 No definite acute interval osseous changes. Electronically Signed   By: Donavan Foil M.D.   On: 12/21/2018 02:31   Dg Foot Complete Left  Result Date: 12/13/2018 Please see detailed radiograph report in office note.  Vas [place] Le Art Seg Multi (segm&le Reynauds)  Result Date: 11/29/2018 LOWER EXTREMITY DOPPLER  STUDY Indications: Patient had right foot hammertoe surgery on 10/18/2018 and has pins              in toes 1,2,3 and 5. The right 4th toe is discolored purple. He              complains of pain in this toe since the surgery. High Risk Factors: Hypertension, hyperlipidemia, past history of smoking.  Comparison Study: None Performing Technologist: Alecia Mackin RVT, RDCS (AE), RDMS  Examination Guidelines: A complete evaluation includes at minimum, Doppler waveform signals and systolic blood pressure reading at the level of bilateral brachial, anterior tibial, and posterior tibial arteries, when vessel segments are accessible. Bilateral testing is considered an integral part of a complete examination. Photoelectric Plethysmograph (PPG) waveforms and toe systolic pressure readings are included as required and additional duplex testing as needed. Limited examinations for reoccurring indications may be performed as noted.  ABI Findings: +---------+------------------+-----+---------+--------+ Right    Rt Pressure (mmHg)IndexWaveform Comment  +---------+------------------+-----+---------+--------+ Brachial 138                                       +---------+------------------+-----+---------+--------+ CFA                             triphasic         +---------+------------------+-----+---------+--------+ Popliteal                       triphasic         +---------+------------------+-----+---------+--------+ ATA      130               0.94 triphasic         +---------+------------------+-----+---------+--------+ PTA      135               0.98 triphasic         +---------+------------------+-----+---------+--------+ PERO     127               0.92 triphasic         +---------+------------------+-----+---------+--------+ Great Toe109               0.79 Normal            +---------+------------------+-----+---------+--------+ +---------+------------------+-----+---------+-------+ Left     Lt Pressure (mmHg)IndexWaveform Comment +---------+------------------+-----+---------+-------+ Brachial 135                                     +---------+------------------+-----+---------+-------+ CFA                             triphasic        +---------+------------------+-----+---------+-------+ Popliteal                       triphasic        +---------+------------------+-----+---------+-------+ ATA      138               1.00 triphasic        +---------+------------------+-----+---------+-------+ PTA      139               1.01 triphasic        +---------+------------------+-----+---------+-------+ PERO     132  0.96 triphasic        +---------+------------------+-----+---------+-------+ Great Toe104               0.75 Normal           +---------+------------------+-----+---------+-------+ +-------+-----------+-----------+------------+------------+ ABI/TBIToday's ABIToday's TBIPrevious ABIPrevious TBI +-------+-----------+-----------+------------+------------+ Right  .98        .79                                  +-------+-----------+-----------+------------+------------+ Left   1.01       .75                                 +-------+-----------+-----------+------------+------------+ TOES Findings: +----------+---------------+--------+-------+ Right ToesPressure (mmHg)WaveformComment +----------+---------------+--------+-------+ 1st Digit                Normal          +----------+---------------+--------+-------+ 2nd Digit                Normal          +----------+---------------+--------+-------+ 3rd Digit                Normal          +----------+---------------+--------+-------+ 4th Digit                Normal          +----------+---------------+--------+-------+ 5th Digit                Abnormal        +----------+---------------+--------+-------+  +---------+---------------+--------+-------+ Left ToesPressure (mmHg)WaveformComment +---------+---------------+--------+-------+ 1st Digit               Normal          +---------+---------------+--------+-------+ 2nd Digit               Normal          +---------+---------------+--------+-------+ 3rd Digit               Normal          +---------+---------------+--------+-------+ 4th Digit               Normal          +---------+---------------+--------+-------+ 5th Digit               Normal          +---------+---------------+--------+-------+   Summary: Right: Resting right ankle-brachial index is within normal range. No evidence of significant right lower extremity arterial disease. The right toe-brachial index is normal. The right 4th toe has a normal PPG waveform. Left: Resting left ankle-brachial index is within normal range. No evidence of significant left lower extremity arterial disease. The left toe-brachial index is normal.  *See table(s) above for measurements and observations.  Electronically signed by Carlyle Dolly MD on 11/29/2018 at 4:38:46 PM.    Final       Discharge Exam: Vitals:    12/22/18 2216 12/23/18 0443  BP: 133/77 117/85  Pulse: (!) 105 91  Resp: 16 18  Temp: 99.9 F (37.7 C) 98.3 F (36.8 C)  SpO2: 98% 94%    General: Pt is alert, awake, not in acute distress Cardiovascular: RRR, S1/S2 +, no edema Respiratory: CTA bilaterally, no wheezing, no rhonchi, no respiratory distress, no conversational dyspnea  Abdominal: Soft, NT, ND, bowel sounds + Extremities: no edema, no cyanosis Psych: Normal mood and affect, stable judgement and  insight     The results of significant diagnostics from this hospitalization (including imaging, microbiology, ancillary and laboratory) are listed below for reference.     Microbiology: Recent Results (from the past 240 hour(s))  SARS CORONAVIRUS 2 (TAT 6-12 HRS) Nasal Swab Aptima Multi Swab     Status: None   Collection Time: 12/21/18  3:15 AM   Specimen: Aptima Multi Swab; Nasal Swab  Result Value Ref Range Status   SARS Coronavirus 2 NEGATIVE NEGATIVE Final    Comment: (NOTE) SARS-CoV-2 target nucleic acids are NOT DETECTED. The SARS-CoV-2 RNA is generally detectable in upper and lower respiratory specimens during the acute phase of infection. Negative results do not preclude SARS-CoV-2 infection, do not rule out co-infections with other pathogens, and should not be used as the sole basis for treatment or other patient management decisions. Negative results must be combined with clinical observations, patient history, and epidemiological information. The expected result is Negative. Fact Sheet for Patients: SugarRoll.be Fact Sheet for Healthcare Providers: https://www.woods-mathews.com/ This test is not yet approved or cleared by the [place] FDA and  has been authorized for detection and/or diagnosis of SARS-CoV-2 by FDA under an Emergency Use Authorization (EUA). This EUA will remain  in effect (meaning this test can be used) for the duration of the COVID-19  declaration under Section 56 4(b)(1) of the Act, 21 U.S.C. section 360bbb-3(b)(1), unless the authorization is terminated or revoked sooner. Performed at [facility], [place] [address], [place], [place] [address]      Labs: BNP (last 3 results) No results for input(s): BNP in the last 8760 hours. Basic Metabolic Panel: Recent Labs  Lab 12/21/18 0149 12/22/18 0233  NA 137 135  K 3.6 3.6  CL 104 104  CO2 22 25  GLUCOSE 102* 130*  BUN 8 12  CREATININE 0.72 0.82  CALCIUM 10.7* 10.0   Liver Function Tests: Recent Labs  Lab 12/21/18 0149  AST 34  ALT 23  ALKPHOS 90  BILITOT 0.6  PROT 8.5*  ALBUMIN 3.5   No results for input(s): LIPASE, AMYLASE in the last 168 hours. No results for input(s): AMMONIA in the last 168 hours. CBC: Recent Labs  Lab 12/21/18 0149 12/22/18 0233 12/23/18 0316  WBC 6.3 4.6 6.3  NEUTROABS 4.0  --   --   HGB 10.5* 9.5* 10.1*  HCT 34.4* 31.0* 32.7*  MCV 75.8* 75.2* 75.2*  PLT 352 336 373   Cardiac Enzymes: No results for input(s): CKTOTAL, CKMB, CKMBINDEX, TROPONINI in the last 168 hours. BNP: Invalid input(s): POCBNP CBG: No results for input(s): GLUCAP in the last 168 hours. D-Dimer No results for input(s): DDIMER in the last 72 hours. Hgb A1c No results for input(s): HGBA1C in the last 72 hours. Lipid Profile No results for input(s): CHOL, HDL, LDLCALC, TRIG, CHOLHDL, LDLDIRECT in the last 72 hours. Thyroid function studies No results for input(s): TSH, T4TOTAL, T3FREE, THYROIDAB in the last 72 hours.  Invalid input(s): FREET3 Anemia work up No results for input(s): VITAMINB12, FOLATE, FERRITIN, TIBC, IRON, RETICCTPCT in the last 72 hours. Urinalysis    Component Value Date/Time   COLORURINE YELLOW 07/09/2013 1801   APPEARANCEUR CLEAR 07/09/2013 1801   LABSPEC 1.023 07/09/2013 1801   PHURINE 5.5 07/09/2013 1801   GLUCOSEU NEGATIVE 07/09/2013 1801   HGBUR NEGATIVE 07/09/2013 1801   BILIRUBINUR SMALL (A) 07/09/2013  1801   KETONESUR 40 (A) 07/09/2013 1801   PROTEINUR NEGATIVE 07/09/2013 1801   UROBILINOGEN 0.2 07/09/2013 1801  NITRITE NEGATIVE 07/09/2013 1801   LEUKOCYTESUR NEGATIVE 07/09/2013 1801   Sepsis Labs Invalid input(s): PROCALCITONIN,  WBC,  LACTICIDVEN Microbiology Recent Results (from the past 240 hour(s))  SARS CORONAVIRUS 2 (TAT 6-12 HRS) Nasal Swab Aptima Multi Swab     Status: None   Collection Time: 12/21/18  3:15 AM   Specimen: Aptima Multi Swab; Nasal Swab  Result Value Ref Range Status   SARS Coronavirus 2 NEGATIVE NEGATIVE Final    Comment: (NOTE) SARS-CoV-2 target nucleic acids are NOT DETECTED. The SARS-CoV-2 RNA is generally detectable in upper and lower respiratory specimens during the acute phase of infection. Negative results do not preclude SARS-CoV-2 infection, do not rule out co-infections with other pathogens, and should not be used as the sole basis for treatment or other patient management decisions. Negative results must be combined with clinical observations, patient history, and epidemiological information. The expected result is Negative. Fact Sheet for Patients: SugarRoll.be Fact Sheet for Healthcare Providers: https://www.woods-mathews.com/ This test is not yet approved or cleared by the [place] FDA and  has been authorized for detection and/or diagnosis of SARS-CoV-2 by FDA under an Emergency Use Authorization (EUA). This EUA will remain  in effect (meaning this test can be used) for the duration of the COVID-19 declaration under Section 56 4(b)(1) of the Act, 21 U.S.C. section 360bbb-3(b)(1), unless the authorization is terminated or revoked sooner. Performed at [facility], [place] [address] [place], [place] [address]      Patient was seen and examined on the day of discharge and was found to be in stable condition. Time coordinating discharge: 25 minutes including assessment and coordination  of care, as well as examination of the patient.   SIGNED:  Dessa Phi, DO [facility] [email or web address] 12/23/2018, 8:11 AM

## 2018-12-24 ENCOUNTER — Telehealth: Payer: Self-pay | Admitting: Podiatry

## 2018-12-24 ENCOUNTER — Other Ambulatory Visit: Payer: Self-pay | Admitting: Podiatry

## 2018-12-24 MED ORDER — OXYCODONE-ACETAMINOPHEN 5-325 MG PO TABS: 1.0000 | ORAL_TABLET | Freq: Four times a day (QID) | ORAL | 0 refills | Status: DC | PRN

## 2018-12-24 NOTE — Telephone Encounter (Signed)
Pt is out of pain medication and would like a refill.

## 2018-12-24 NOTE — Telephone Encounter (Signed)
I called pt and he states he is having a severe burning in the left foot.

## 2018-12-24 NOTE — Telephone Encounter (Signed)
Rx sent 

## 2018-12-26 ENCOUNTER — Telehealth: Payer: Self-pay | Admitting: Podiatry

## 2018-12-26 NOTE — Telephone Encounter (Signed)
Bandages really need changing/ haven't been changed since Sunday. He has some drainage. Wanted to have someone come out to the house today.  Please call patient.

## 2018-12-26 NOTE — Telephone Encounter (Signed)
I called [facility] listed phone and her partner states she had left her phone this morning.

## 2018-12-26 NOTE — Telephone Encounter (Signed)
I called [facility] [place] transferred to [place], she states they have a nurse going to his home today to re-establish after hospitalization.

## 2018-12-27 ENCOUNTER — Telehealth: Payer: Self-pay | Admitting: *Deleted

## 2018-12-27 DIAGNOSIS — L0390 Cellulitis, unspecified: Secondary | ICD-10-CM | POA: Insufficient documentation

## 2018-12-27 DIAGNOSIS — R739 Hyperglycemia, unspecified: Secondary | ICD-10-CM | POA: Insufficient documentation

## 2018-12-27 NOTE — Telephone Encounter (Signed)
Left message informing Jonathan [name] - [facility] to continue [facility] as described and I would contact her tomorrow with any new orders.

## 2018-12-27 NOTE — Telephone Encounter (Signed)
[facility] states she needs verbal orders to resume wound care and pt has a new wound to the left ankle and shin, continue current wound care Monday, Wednesday and Friday.

## 2018-12-27 NOTE — Progress Notes (Signed)
Patient is here today for postoperative dressing change, surgery was performed on 10/18/2018 right forefoot reconstruction.  He states that he is feeling pretty good, and his pain is improving.  Removed surgical bandages, and applied Betadine wet-to-dry along with compression.  Patient patient was advised to remain in his boot at all times, and follow-up on Monday for dressing change.

## 2018-12-28 NOTE — Progress Notes (Signed)
Patient is here today for postoperative dressing change, surgery was performed on 10/18/2018 right forefoot reconstruction.  He states that he is feeling pretty good, and his pain is improving.  Removed surgical bandages, and applied silver alginate dressing, and Betadine wet-to-dry along with compression.  Patient patient was advised to remain in his boot at all times, and follow-up on Monday for dressing change.

## 2019-01-01 ENCOUNTER — Telehealth: Payer: Self-pay | Admitting: Podiatry

## 2019-01-01 ENCOUNTER — Encounter (HOSPITAL_BASED_OUTPATIENT_CLINIC_OR_DEPARTMENT_OTHER): Payer: Medicare Other

## 2019-01-01 NOTE — Telephone Encounter (Signed)
Pt wants to know if boot can be taken off during visit tomorrow. States it is heavy.

## 2019-01-02 ENCOUNTER — Ambulatory Visit (INDEPENDENT_AMBULATORY_CARE_PROVIDER_SITE_OTHER): Payer: Medicare Other | Admitting: Podiatry

## 2019-01-02 ENCOUNTER — Other Ambulatory Visit: Payer: Self-pay

## 2019-01-02 DIAGNOSIS — Z9889 Other specified postprocedural states: Secondary | ICD-10-CM | POA: Diagnosis not present

## 2019-01-02 DIAGNOSIS — L24A9 Irritant contact dermatitis due friction or contact with other specified body fluids: Secondary | ICD-10-CM

## 2019-01-02 DIAGNOSIS — L97522 Non-pressure chronic ulcer of other part of left foot with fat layer exposed: Secondary | ICD-10-CM

## 2019-01-02 DIAGNOSIS — M05771 Rheumatoid arthritis with rheumatoid factor of right ankle and foot without organ or systems involvement: Secondary | ICD-10-CM

## 2019-01-02 DIAGNOSIS — M2042 Other hammer toe(s) (acquired), left foot: Secondary | ICD-10-CM

## 2019-01-02 DIAGNOSIS — M2041 Other hammer toe(s) (acquired), right foot: Secondary | ICD-10-CM | POA: Diagnosis not present

## 2019-01-02 DIAGNOSIS — T148XXA Other injury of unspecified body region, initial encounter: Secondary | ICD-10-CM

## 2019-01-04 ENCOUNTER — Telehealth: Payer: Self-pay | Admitting: Podiatry

## 2019-01-04 NOTE — Telephone Encounter (Signed)
Pt was suppose to receive pain medication on Wednesday. Patient called and stated it was not at the pharmacy

## 2019-01-07 ENCOUNTER — Other Ambulatory Visit: Payer: Self-pay | Admitting: Podiatry

## 2019-01-07 MED ORDER — SULFAMETHOXAZOLE-TRIMETHOPRIM 800-160 MG PO TABS: 1.0000 | ORAL_TABLET | Freq: Two times a day (BID) | ORAL | 0 refills | Status: DC

## 2019-01-07 MED ORDER — OXYCODONE-ACETAMINOPHEN 5-325 MG PO TABS: 1.0000 | ORAL_TABLET | Freq: Four times a day (QID) | ORAL | 0 refills | Status: DC | PRN

## 2019-01-07 NOTE — Telephone Encounter (Signed)
I informed Pt Dr. Amalia Hailey had sent the medications to the [id number].

## 2019-01-07 NOTE — Telephone Encounter (Signed)
No change in orders... if there are any changes that need to be made, Wound Care can do it to their preference.  - Dr. Amalia Hailey

## 2019-01-07 NOTE — Telephone Encounter (Signed)
Pt has called back again to check on the status of his pain medication. Pt stated that his foot has burning sensation on the top and side of foot. Patient has ran out of his anti-biotic as well. Please call patient

## 2019-01-07 NOTE — Telephone Encounter (Signed)
Both Rxs sent to pharmacy... Refills on Percocet and Bactrim DS. Please inform patient.   - Dr. Amalia Hailey

## 2019-01-08 NOTE — Progress Notes (Signed)
   Subjective:  Patient presents today status post right forefoot reconstruction surgery. DOS: 10/18/2018.  Patient continues to have home health dressing changes to the bilateral ulcerations.  His right foot/surgical foot appears to be improving significantly.  He continues to have some aching and tenderness at nighttime.  No new complaints at this time.  Past Medical History:  Diagnosis Date  . Anemia    H/o using iron in the past   . Arthritis    rheumatoid  . Chronic back pain   . Hyperlipidemia   . Joint pain   . Joint swelling   . Rheumatoid arthritis(714.0)       Objective/Physical Exam Neurovascular status intact.  Significantly improved ulceration to the fourth digit of the surgical foot right.  The focal area of dehiscence overlying the third toe dorsal incision measuring approximately 0.50.5 x 1.0 cm appears to be somewhat improved since last visit.  This dehiscence does not probe to bone however it is somewhat deep.  There continues to be some moderate drainage to the dehiscence site as well as the fourth toe with periwound maceration. Vascular status appears to be intact.  I am able to palpate the posterior tibial and dorsalis pedis arteries to the surgical extremity.  The skin is cool to touch however.  Multiple ulcerations noted to the left lower extremity, nonsurgical extremity.  Most concerning ulcer is to the dorsal aspect of the left forefoot measuring approximately 4.0 x 4.0 x 0.3 cm.  Somewhat unchanged since last visit to the noted ulceration there is a moderate amount of fibrotic tissue noted.  Mild malodor noted.  There is no exposed bone muscle tendon ligament or joint.  Moderate amount of serosanguineous drainage noted.  Periwound integrity is intact.  Assessment: 1. s/p right forefoot reconstruction surgery. DOS: 10/18/2018 2.  Multiple ulcerations left lower extremity   Plan of Care:  1. Patient was evaluated. Aquacel and DSD applied.  2.  Continue home  health dressing changes every other day using Aquacel and DSD.  3.  Recommend follow-up with the [facility] for the left foot ulcerations.  I am going to turn over management of the wound care to them. 4.  Patient also wants to know about reinitiating his infusion therapy for rheumatoid arthritis.  I do not see any indication why not to proceed now with infusion therapy.  Surgery is almost 3 months postoperative. 5.  Today we dispensed a postoperative shoe for the right foot.  He can now discontinue the immobilization cam boot 6.  Return to clinic in 4 weeks  Edrick Kins, DPM [facility]  Dr. Edrick Kins, [address]                                        [place], [place] [address]                Office [phone]  Fax [phone]

## 2019-01-10 ENCOUNTER — Encounter (HOSPITAL_BASED_OUTPATIENT_CLINIC_OR_DEPARTMENT_OTHER): Payer: Medicare Other | Attending: Internal Medicine

## 2019-01-10 DIAGNOSIS — Z79899 Other long term (current) drug therapy: Secondary | ICD-10-CM | POA: Diagnosis not present

## 2019-01-10 DIAGNOSIS — I87331 Chronic venous hypertension (idiopathic) with ulcer and inflammation of right lower extremity: Secondary | ICD-10-CM | POA: Diagnosis present

## 2019-01-10 DIAGNOSIS — M059 Rheumatoid arthritis with rheumatoid factor, unspecified: Secondary | ICD-10-CM | POA: Insufficient documentation

## 2019-01-10 DIAGNOSIS — Z7901 Long term (current) use of anticoagulants: Secondary | ICD-10-CM | POA: Diagnosis not present

## 2019-01-10 DIAGNOSIS — L97522 Non-pressure chronic ulcer of other part of left foot with fat layer exposed: Secondary | ICD-10-CM | POA: Insufficient documentation

## 2019-01-10 DIAGNOSIS — L97812 Non-pressure chronic ulcer of other part of right lower leg with fat layer exposed: Secondary | ICD-10-CM | POA: Insufficient documentation

## 2019-01-10 DIAGNOSIS — I10 Essential (primary) hypertension: Secondary | ICD-10-CM | POA: Diagnosis not present

## 2019-01-10 DIAGNOSIS — L97822 Non-pressure chronic ulcer of other part of left lower leg with fat layer exposed: Secondary | ICD-10-CM | POA: Diagnosis not present

## 2019-01-10 DIAGNOSIS — L97512 Non-pressure chronic ulcer of other part of right foot with fat layer exposed: Secondary | ICD-10-CM | POA: Diagnosis not present

## 2019-01-17 DIAGNOSIS — I87331 Chronic venous hypertension (idiopathic) with ulcer and inflammation of right lower extremity: Secondary | ICD-10-CM | POA: Diagnosis not present

## 2019-01-21 ENCOUNTER — Telehealth: Payer: Self-pay | Admitting: *Deleted

## 2019-01-21 NOTE — Telephone Encounter (Signed)
-----   Message from Trula Slade, DPM sent at 01/18/2019  7:01 AM EDT ----- Dr. Amalia Hailey- this is your patient. Forwarding the results.

## 2019-01-24 ENCOUNTER — Other Ambulatory Visit: Payer: Self-pay

## 2019-01-24 ENCOUNTER — Encounter (HOSPITAL_BASED_OUTPATIENT_CLINIC_OR_DEPARTMENT_OTHER): Payer: Medicare Other | Attending: Internal Medicine | Admitting: Internal Medicine

## 2019-01-24 DIAGNOSIS — L97812 Non-pressure chronic ulcer of other part of right lower leg with fat layer exposed: Secondary | ICD-10-CM | POA: Insufficient documentation

## 2019-01-24 DIAGNOSIS — L97512 Non-pressure chronic ulcer of other part of right foot with fat layer exposed: Secondary | ICD-10-CM | POA: Insufficient documentation

## 2019-01-24 DIAGNOSIS — L97522 Non-pressure chronic ulcer of other part of left foot with fat layer exposed: Secondary | ICD-10-CM | POA: Insufficient documentation

## 2019-01-24 DIAGNOSIS — I87331 Chronic venous hypertension (idiopathic) with ulcer and inflammation of right lower extremity: Secondary | ICD-10-CM | POA: Diagnosis not present

## 2019-01-24 DIAGNOSIS — L97822 Non-pressure chronic ulcer of other part of left lower leg with fat layer exposed: Secondary | ICD-10-CM | POA: Diagnosis not present

## 2019-01-24 NOTE — Progress Notes (Signed)
Jonathan [name], Jonathan [name] ([id number]) Visit Report for 01/24/2019 HPI Details Patient Name: Date of Service: Jonathan [name], Jonathan [name] 01/24/2019 3:45 PM Medical Record [id number] Patient Account Number: [id number] Date of Birth/Sex: Treating RN: 08-04-1960 (58 y.o. [name] Primary Care Provider: Sinclair Ship Other Clinician: Referring Provider: Treating Provider/Extender:Jonathan [name], Gustavo Lah, Cynda Familia in Treatment: 2 History of Present Illness HPI Description: ADMISSION 01/10/2019 This is a 58 year old man who has rheumatoid arthritis on Remicade. He is not a diabetic. He had forefoot surgery by Dr. Bettye Boeck on June 25 for correction of hammertoes. At some point in the postop follow-up he presented with multiple wounds on the left foot. I do not really have a good description at this point but I will try to look through [facility] health link. He had arterial studies on 11/28/2018 that were within normal limits. Seen by podiatry on 8/24 with multiple left foot and lower extremity ulcers. He was treated with Bactrim and Cipro. He was hospitalized from 8/28 through 8/30 with wounds on the left anterior foot. An MRI was negative at that point the wounds were on the left anterior and left medial foot. Felt to have cellulitis. He saw his primary doctor in follow-up on 9/3 and felt to have pressure ulcers on the foot. The patient states that he developed these after some acute swelling last month. This may have been a result of infection. He comes in today with multiple wounds on his toes dorsal feet and distal lower extremities anteriorly. The exact cause of this is not completely clear. He does not have a prior wound history. He has multiple lower extremity wounds including the left medial foot, left dorsal foot, left second toe, left anterior mid tibia, right dorsal foot, right fourth toe medially and the right medial lower leg. The areas on the left medial foot and left dorsal foot are large  superficial wounds most of the rest of these are small punched out looking areas. He does not complain of a lot of pain Past medical history; hypertension, rheumatoid arthritis on Remicade infusions, hyperlipidemia right forefoot surgery on 6/25, he is on Xarelto at this point for reasons that are not totally clear. Arterial studies on 11/28/2018 showed an ABI of 0.98 on the right 1.01 on the left. TBI's of 0.79 on the right 0.75 on the left and triphasic waveforms bilaterally 9/24; wounds on his bilateral lower feet and lower legs. We have been using silver collagen on the wounds. 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 Follows with Dr. Raliegh Ip" rheumatology at [facility]. 10/1; patient with wounds on his bilateral lower feet and lower legs we have been using silver collagen. 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 He does not have macrovascular disease by previous noninvasive studies in August. Electronic Signature(s) Signed: 01/24/2019 6:13:48 PM By: Linton Ham MD Entered By: Linton Ham on 01/24/2019 17:53:29 -------------------------------------------------------------------------------- Physical Exam Details Patient Name: Date of Service: Jonathan [name], Jonathan [name] 01/24/2019 3:45 PM Medical Record [id number] Patient Account Number: [id number] Date of Birth/Sex: Treating RN: 1960-11-17 (58 y.o. [name] Primary Care Provider: Sinclair Ship Other Clinician: Referring Provider: Treating Provider/Extender:Jonathan [name], Gustavo Lah, Cynda Familia in Treatment: 2 Constitutional Sitting or standing Blood Pressure is within target range for patient.. Pulse regular and within target range for patient.Marland Kitchen Respirations regular, non-labored and within target  range.Marland Kitchen  Temperature is normal and within the target range for the patient.Marland Kitchen Appears in no distress. Eyes Conjunctivae clear. No discharge.no icterus. Respiratory work of breathing is normal. Cardiovascular Pedal pulses palpable and strong bilaterally.. Lymphatic None palpable in the popliteal area. Musculoskeletal Changes of chronic RA and is this. Integumentary (Hair, Skin) Skin changes suggestive of chronic venous insufficiency/inflammation. Psychiatric appears at normal baseline. Notes Wound exam; multiple wounds most extensively on the left medial and left dorsal foot. On the right side the right dorsal foot, right fourth toe and right medial lower leg. All of these have a's mall and punched out appearance. They really do not look like venous insufficiency wounds. Electronic Signature(s) Signed: 01/24/2019 6:13:48 PM By: Linton Ham MD Entered By: Linton Ham on 01/24/2019 17:55:16 -------------------------------------------------------------------------------- Physician Orders Details Patient Name: Date of Service: Jonathan [name], Jonathan [name] 01/24/2019 3:45 PM Medical Record [id number] Patient Account Number: [id number] Date of Birth/Sex: Treating RN: October 08, 1960 (58 y.o. [name] Primary Care Provider: Sinclair Ship Other Clinician: Referring Provider: Treating Provider/Extender:Jonathan [name], Gustavo Lah, Cynda Familia in Treatment: 2 Verbal / Phone Orders: No Diagnosis Coding ICD-10 Coding Code Description L97.521 Non-pressure chronic ulcer of other part of left foot limited to breakdown of skin L97.821 Non-pressure chronic ulcer of other part of left lower leg limited to breakdown of skin L97.511 Non-pressure chronic ulcer of other part of right foot limited to breakdown of skin L97.812 Non-pressure chronic ulcer of other part of right lower leg with fat layer exposed I87.331 Chronic venous hypertension (idiopathic) with ulcer and inflammation of right lower  extremity M05.80 Other rheumatoid arthritis with rheumatoid factor of unspecified site Follow-up Appointments Return Appointment in 1 week. Dressing Change Frequency Change dressing three times week. - twice a week by home health and once a week by wound center. Skin Barriers/Peri-Wound Care TCA Cream or Ointment - liberally to both legs, feet, toes, and all wounds in clinic and at home by home health. Wound Cleansing Clean wound with Wound Cleanser Primary Wound Dressing Wound #1 Right,Medial Toe Fourth Silver Collagen - moisten with saline or hydrogel. Wound #2 Right,Dorsal Foot Silver Collagen - moisten with saline or hydrogel. Wound #3 Right,Medial Lower Leg Silver Collagen - moisten with saline or hydrogel. Wound #4 Left,Medial Foot Silver Collagen - moisten with saline or hydrogel. Wound #5 Left,Dorsal Foot Silver Collagen - moisten with saline or hydrogel. Wound #6 Left,Anterior Lower Leg Silver Collagen - moisten with saline or hydrogel. Wound #8 Left,Medial,Dorsal Foot Silver Collagen - moisten with saline or hydrogel. Secondary Dressing Wound #1 Right,Medial Toe Fourth Kerlix/Rolled Gauze Dry Gauze Wound #2 Right,Dorsal Foot Dry Gauze ABD pad Wound #3 Right,Medial Lower Leg Dry Gauze ABD pad Wound #4 Left,Medial Foot Dry Gauze ABD pad Wound #5 Left,Dorsal Foot Dry Gauze ABD pad Wound #6 Left,Anterior Lower Leg Dry Gauze ABD pad Wound #8 Left,Medial,Dorsal Foot Dry Gauze ABD pad Edema Control 3 Layer Compression System - Bilateral Avoid standing for long periods of time Elevate legs to the level of the heart or above for 30 minutes daily and/or when sitting, a frequency of: - throughout the day. [place] [facility] for wound care. - [facility]. Electronic Signature(s) Signed: 01/24/2019 6:13:48 PM By: Linton Ham MD Signed: 01/24/2019 6:31:45 PM By: Deon Pilling Entered By: Deon Pilling on 01/24/2019  17:20:02 -------------------------------------------------------------------------------- Problem List Details Patient Name: Date of Service: Jonathan [name], Jonathan [name] 01/24/2019 3:45 PM Medical Record [id number] Patient Account Number: [id number] Date of Birth/Sex: Treating RN: 11/09/1960 (58 y.o. Lorette Ang, Tammi Klippel Primary  Care Provider: Sinclair Ship Other Clinician: Referring Provider: Treating Provider/Extender:Jonathan [name], Gustavo Lah, Cynda Familia in Treatment: 2 Active Problems ICD-10 Evaluated Encounter Code Description Active Date Today Diagnosis L97.521 Non-pressure chronic ulcer of other part of left foot 01/10/2019 No Yes limited to breakdown of skin L97.821 Non-pressure chronic ulcer of other part of left lower 01/10/2019 No Yes leg limited to breakdown of skin L97.511 Non-pressure chronic ulcer of other part of right foot 01/10/2019 No Yes limited to breakdown of skin L97.812 Non-pressure chronic ulcer of other part of right lower 01/10/2019 No Yes leg with fat layer exposed I87.331 Chronic venous hypertension (idiopathic) with ulcer 01/10/2019 No Yes and inflammation of right lower extremity M05.80 Other rheumatoid arthritis with rheumatoid factor of 01/10/2019 No Yes unspecified site Inactive Problems Resolved Problems Electronic Signature(s) Signed: 01/24/2019 6:13:48 PM By: Linton Ham MD Entered By: Linton Ham on 01/24/2019 17:51:53 -------------------------------------------------------------------------------- Progress Note Details Patient Name: Date of Service: Jonathan [name], Jonathan [name] 01/24/2019 3:45 PM Medical Record [id number] Patient Account Number: [id number] Date of Birth/Sex: Treating RN: [birth year] (58 y.o. [name] Primary Care Provider: Sinclair Ship Other Clinician: Referring Provider: Treating Provider/Extender:Jonathan [name], Gustavo Lah, Cynda Familia in Treatment: 2 Subjective History of Present Illness (HPI) ADMISSION 01/10/2019 This is a  58 year old man who has rheumatoid arthritis on Remicade. He is not a diabetic. He had forefoot surgery by Dr. Bettye Boeck on June 25 for correction of hammertoes. At some point in the postop follow-up he presented with multiple wounds on the left foot. I do not really have a good description at this point but I will try to look through [facility] health link. He had arterial studies on 11/28/2018 that were within normal limits. Seen by podiatry on 8/24 with multiple left foot and lower extremity ulcers. He was treated with Bactrim and Cipro. He was hospitalized from 8/28 through 8/30 with wounds on the left anterior foot. An MRI was negative at that point the wounds were on the left anterior and left medial foot. Felt to have cellulitis. He saw his primary doctor in follow-up on 9/3 and felt to have pressure ulcers on the foot. The patient states that he developed these after some acute swelling last month. This may have been a result of infection. He comes in today with multiple wounds on his toes dorsal feet and distal lower extremities anteriorly. The exact cause of this is not completely clear. He does not have a prior wound history. He has multiple lower extremity wounds including the left medial foot, left dorsal foot, left second toe, left anterior mid tibia, right dorsal foot, right fourth toe medially and the right medial lower leg. The areas on the left medial foot and left dorsal foot are large superficial wounds most of the rest of these are small punched out looking areas. He does not complain of a lot of pain Past medical history; hypertension, rheumatoid arthritis on Remicade infusions, hyperlipidemia right forefoot surgery on 6/25, he is on Xarelto at this point for reasons that are not totally clear. Arterial studies on 11/28/2018 showed an ABI of 0.98 on the right 1.01 on the left. TBI's of 0.79 on the right 0.75 on the left and triphasic waveforms bilaterally 9/24; wounds on his  bilateral lower feet and lower legs. We have been using silver collagen on the wounds. 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 Follows with Dr. Raliegh Ip" rheumatology at [facility].  10/1; patient with wounds on his bilateral lower feet and lower legs we have been using silver collagen. 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 He does not have macrovascular disease by previous noninvasive studies in August. Objective Constitutional Sitting or standing Blood Pressure is within target range for patient.. Pulse regular and within target range for patient.Marland Kitchen Respirations regular, non-labored and within target range.. Temperature is normal and within the target range for the patient.Marland Kitchen Appears in no distress. Vitals Time Taken: 4:25 PM, Height: 63 in, Weight: 162 lbs, BMI: 28.7, Temperature: 98.9 F, Pulse: 100 bpm, Respiratory Rate: 18 breaths/min, Blood Pressure: 123/76 mmHg. Eyes Conjunctivae clear. No discharge.no icterus. Respiratory work of breathing is normal. Cardiovascular Pedal pulses palpable and strong bilaterally.. Lymphatic None palpable in the popliteal area. Musculoskeletal Changes of chronic RA and is this. Psychiatric appears at normal baseline. General Notes: Wound exam; multiple wounds most extensively on the left medial and left dorsal foot. On the right side the right dorsal foot, right fourth toe and right medial lower leg. All of these have a's mall and punched out appearance. They really do not look like venous insufficiency wounds. Integumentary (Hair, Skin) Skin changes suggestive of chronic venous insufficiency/inflammation. Wound #1 status is Open. Original cause of wound was Gradually Appeared. The wound is located on  the Right,Medial Toe Fourth. The wound measures 0.5cm length x 0.4cm width x 0.1cm depth; 0.157cm^2 area and 0.016cm^3 volume. There is Fat Layer (Subcutaneous Tissue) Exposed exposed. There is no tunneling or undermining noted. There is a small amount of serosanguineous drainage noted. The wound margin is flat and intact. There is large (67-100%) red granulation within the wound bed. There is no necrotic tissue within the wound bed. Wound #2 status is Open. Original cause of wound was Gradually Appeared. The wound is located on the Right,Dorsal Foot. The wound measures 0.2cm length x 0.2cm width x 0.5cm depth; 0.031cm^2 area and 0.016cm^3 volume. There is Fat Layer (Subcutaneous Tissue) Exposed exposed. There is no tunneling or undermining noted. There is a small amount of serosanguineous drainage noted. The wound margin is well defined and not attached to the wound base. There is large (67-100%) pink granulation within the wound bed. There is no necrotic tissue within the wound bed. Wound #3 status is Open. Original cause of wound was Gradually Appeared. The wound is located on the Right,Medial Lower Leg. The wound measures 0.6cm length x 2cm width x 0.1cm depth; 0.942cm^2 area and 0.094cm^3 volume. There is Fat Layer (Subcutaneous Tissue) Exposed exposed. There is no tunneling or undermining noted. There is a medium amount of serosanguineous drainage noted. The wound margin is flat and intact. There is medium (34-66%) pink granulation within the wound bed. There is a medium (34-66%) amount of necrotic tissue within the wound bed including Adherent Slough. Wound #4 status is Open. Original cause of wound was Gradually Appeared. The wound is located on the Left,Medial Foot. The wound measures 1.5cm length x 3cm width x 0.1cm depth; 3.534cm^2 area and 0.353cm^3 volume. There is Fat Layer (Subcutaneous Tissue) Exposed exposed. There is no tunneling or undermining noted. There is a medium amount  of serosanguineous drainage noted. The wound margin is well defined and not attached to the wound base. There is large (67-100%) red granulation within the wound bed. There is a small (1-33%) amount of necrotic tissue within the wound bed including Adherent Slough. Wound #5 status is  Open. Original cause of wound was Gradually Appeared. The wound is located on the Left,Dorsal Foot. The wound measures 2.8cm length x 2.2cm width x 0.1cm depth; 4.838cm^2 area and 0.484cm^3 volume. There is Fat Layer (Subcutaneous Tissue) Exposed exposed. There is no tunneling or undermining noted. There is a medium amount of serosanguineous drainage noted. The wound margin is well defined and not attached to the wound base. There is medium (34-66%) red granulation within the wound bed. There is a medium (34-66%) amount of necrotic tissue within the wound bed including Adherent Slough. Wound #6 status is Open. Original cause of wound was Gradually Appeared. The wound is located on the Left,Anterior Lower Leg. The wound measures 1.2cm length x 0.8cm width x 0.1cm depth; 0.754cm^2 area and 0.075cm^3 volume. There is Fat Layer (Subcutaneous Tissue) Exposed exposed. There is no tunneling or undermining noted. There is a small amount of serosanguineous drainage noted. The wound margin is flat and intact. There is medium (34-66%) pink granulation within the wound bed. There is a small (1-33%) amount of necrotic tissue within the wound bed including Adherent Slough. Wound #8 status is Open. Original cause of wound was Trauma. The wound is located on the Left,Medial,Dorsal Foot. The wound measures 0.5cm length x 0.2cm width x 0.1cm depth; 0.079cm^2 area and 0.008cm^3 volume. There is Fat Layer (Subcutaneous Tissue) Exposed exposed. There is no tunneling or undermining noted. There is a small amount of serosanguineous drainage noted. The wound margin is distinct with the outline attached to the wound base. There is large  (67-100%) pink granulation within the wound bed. There is no necrotic tissue within the wound bed. Assessment Active Problems ICD-10 Non-pressure chronic ulcer of other part of left foot limited to breakdown of skin Non-pressure chronic ulcer of other part of left lower leg limited to breakdown of skin Non-pressure chronic ulcer of other part of right foot limited to breakdown of skin Non-pressure chronic ulcer of other part of right lower leg with fat layer exposed Chronic venous hypertension (idiopathic) with ulcer and inflammation of right lower extremity Other rheumatoid arthritis with rheumatoid factor of unspecified site Procedures Wound #2 Pre-procedure diagnosis of Wound #2 is an Inflammatory located on the Right,Dorsal Foot . There was a Three Layer Compression Therapy Procedure with a pre-treatment ABI of 1 by Baruch Gouty, RN. Post procedure Diagnosis Wound #2: Same as Pre-Procedure Wound #3 Pre-procedure diagnosis of Wound #3 is a Venous Leg Ulcer located on the Right,Medial Lower Leg . There was a Three Layer Compression Therapy Procedure with a pre-treatment ABI of 1 by Baruch Gouty, RN. Post procedure Diagnosis Wound #3: Same as Pre-Procedure Wound #4 Pre-procedure diagnosis of Wound #4 is an Inflammatory located on the Left,Medial Foot . There was a Three Layer Compression Therapy Procedure with a pre-treatment ABI of 1 by Baruch Gouty, RN. Post procedure Diagnosis Wound #4: Same as Pre-Procedure Wound #5 Pre-procedure diagnosis of Wound #5 is an Inflammatory located on the Left,Dorsal Foot . There was a Three Layer Compression Therapy Procedure with a pre-treatment ABI of 1 by Baruch Gouty, RN. Post procedure Diagnosis Wound #5: Same as Pre-Procedure Wound #6 Pre-procedure diagnosis of Wound #6 is a Venous Leg Ulcer located on the Left,Anterior Lower Leg . There was a Three Layer Compression Therapy Procedure with a pre-treatment ABI of 1 by Baruch Gouty,  RN. Post procedure Diagnosis Wound #6: Same as Pre-Procedure Wound #8 Pre-procedure diagnosis of Wound #8 is a Venous Leg Ulcer located on the Left,Medial,Dorsal Foot . There  was a Three Layer Compression Therapy Procedure with a pre-treatment ABI of 1 by Baruch Gouty, RN. Post procedure Diagnosis Wound #8: Same as Pre-Procedure Plan Follow-up Appointments: Return Appointment in 1 week. Dressing Change Frequency: Change dressing three times week. - twice a week by home health and once a week by wound center. Skin Barriers/Peri-Wound Care: TCA Cream or Ointment - liberally to both legs, feet, toes, and all wounds in clinic and at home by home health. Wound Cleansing: Clean wound with Wound Cleanser Primary Wound Dressing: Wound #1 Right,Medial Toe Fourth: Silver Collagen - moisten with saline or hydrogel. Wound #2 Right,Dorsal Foot: Silver Collagen - moisten with saline or hydrogel. Wound #3 Right,Medial Lower Leg: Silver Collagen - moisten with saline or hydrogel. Wound #4 Left,Medial Foot: Silver Collagen - moisten with saline or hydrogel. Wound #5 Left,Dorsal Foot: Silver Collagen - moisten with saline or hydrogel. Wound #6 Left,Anterior Lower Leg: Silver Collagen - moisten with saline or hydrogel. Wound #8 Left,Medial,Dorsal Foot: Silver Collagen - moisten with saline or hydrogel. Secondary Dressing: Wound #1 Right,Medial Toe Fourth: Kerlix/Rolled Gauze Dry Gauze Wound #2 Right,Dorsal Foot: Dry Gauze ABD pad Wound #3 Right,Medial Lower Leg: Dry Gauze ABD pad Wound #4 Left,Medial Foot: Dry Gauze ABD pad Wound #5 Left,Dorsal Foot: Dry Gauze ABD pad Wound #6 Left,Anterior Lower Leg: Dry Gauze ABD pad Wound #8 Left,Medial,Dorsal Foot: Dry Gauze ABD pad Edema Control: 3 Layer Compression System - Bilateral Avoid standing for long periods of time Elevate legs to the level of the heart or above for 30 minutes daily and/or when sitting, a frequency of: -  throughout the day. Home Health: [place] [facility] for wound care. - [facility]. 1. TCA cream liberally. 2. I have provided him with a mixture of TCA and Cetaphil for use at home with home health 3. If these wounds do not progress I may need to contact his rheumatologist at [facility]. We could consider biopsying 1 of these lesions if that would provide an alternative treatment based on the biopsy results. Electronic Signature(s) Signed: 01/24/2019 6:13:48 PM By: Linton Ham MD Entered By: Linton Ham on 01/24/2019 17:56:53 -------------------------------------------------------------------------------- SuperBill Details Patient Name: Date of Service: JAYKUB, PARSA 01/24/2019 Medical Record [id number] Patient Account Number: [id number] Date of Birth/Sex: Treating RN: 1960/12/30 (58 y.o. [name] Primary Care Provider: Sinclair Ship Other Clinician: Referring Provider: Treating Provider/Extender:Jonathan [name], Gustavo Lah, Cynda Familia in Treatment: 2 Diagnosis Coding ICD-10 Codes Code Description [id number] Non-pressure chronic ulcer of other part of left foot limited to breakdown of skin L97.821 Non-pressure chronic ulcer of other part of left lower leg limited to breakdown of skin L97.511 Non-pressure chronic ulcer of other part of right foot limited to breakdown of skin L97.812 Non-pressure chronic ulcer of other part of right lower leg with fat layer exposed I87.331 Chronic venous hypertension (idiopathic) with ulcer and inflammation of right lower extremity M05.80 Other rheumatoid arthritis with rheumatoid factor of unspecified site Facility Procedures The patient participates with Medicare or their insurance follows the Medicare Facility Guidelines: CPT4 Description Modifier Quantity Code [id number] BILATERAL: Application of multi-layer venous compression system; leg 1 (below knee), including ankle and foot. Physician Procedures CPT4  Code Description: [id number] - WC PHYS LEVEL 3 - EST PT ICD-10 Diagnosis Description M05.80 Other rheumatoid arthritis with rheumatoid factor of uns L97.521 Non-pressure chronic ulcer of other part of left foot li L97.821 Non-pressure chronic  ulcer of other part of left lower l skin L97.511 Non-pressure chronic ulcer of other part  of right foot l Modifier: pecified site mited to breakdo eg limited to br imited to breakd Quantity: 1 wn of skin eakdown of own of skin Electronic Signature(s) Signed: 01/24/2019 6:13:48 PM By: Linton Ham MD Entered By: Linton Ham on 01/24/2019 17:57:21

## 2019-01-28 ENCOUNTER — Other Ambulatory Visit: Payer: Self-pay | Admitting: Podiatry

## 2019-01-28 ENCOUNTER — Telehealth: Payer: Self-pay | Admitting: Podiatry

## 2019-01-28 MED ORDER — OXYCODONE-ACETAMINOPHEN 5-325 MG PO TABS: 1.0000 | ORAL_TABLET | Freq: Four times a day (QID) | ORAL | 0 refills | Status: DC | PRN

## 2019-01-28 MED ORDER — CIPROFLOXACIN HCL 500 MG PO TABS: 500.0000 mg | ORAL_TABLET | Freq: Two times a day (BID) | ORAL | 0 refills | Status: DC

## 2019-01-28 NOTE — Telephone Encounter (Signed)
Scripts sent to pharmacy. - Dr. Amalia Hailey

## 2019-01-28 NOTE — Telephone Encounter (Signed)
Patient called today stating he needs a Rx for more pain meds and antibiotic. Patient is scheduled to come in this Wednesday 01/30/2019 2:45pm for Ulcers on his Lt ft.

## 2019-01-30 ENCOUNTER — Ambulatory Visit (INDEPENDENT_AMBULATORY_CARE_PROVIDER_SITE_OTHER): Payer: Medicare Other | Admitting: Podiatry

## 2019-01-30 ENCOUNTER — Other Ambulatory Visit: Payer: Self-pay

## 2019-01-30 VITALS — Temp 97.9°F

## 2019-01-30 DIAGNOSIS — Z9889 Other specified postprocedural states: Secondary | ICD-10-CM

## 2019-01-30 DIAGNOSIS — M2041 Other hammer toe(s) (acquired), right foot: Secondary | ICD-10-CM

## 2019-01-30 DIAGNOSIS — L97522 Non-pressure chronic ulcer of other part of left foot with fat layer exposed: Secondary | ICD-10-CM

## 2019-01-30 DIAGNOSIS — M05771 Rheumatoid arthritis with rheumatoid factor of right ankle and foot without organ or systems involvement: Secondary | ICD-10-CM

## 2019-01-30 DIAGNOSIS — M2042 Other hammer toe(s) (acquired), left foot: Secondary | ICD-10-CM

## 2019-01-31 ENCOUNTER — Encounter (HOSPITAL_BASED_OUTPATIENT_CLINIC_OR_DEPARTMENT_OTHER): Payer: Medicare Other | Admitting: Internal Medicine

## 2019-01-31 DIAGNOSIS — I87331 Chronic venous hypertension (idiopathic) with ulcer and inflammation of right lower extremity: Secondary | ICD-10-CM | POA: Diagnosis not present

## 2019-01-31 NOTE — Progress Notes (Signed)
BEVAN, BUGGY ([id number]) Visit Report for 01/31/2019 Debridement Details Patient Name: Date of Service: Jonathan [name], Jonathan [name] 01/31/2019 11:30 AM Medical Record [id number] Patient Account Number: [id number] Date of Birth/Sex: 08-08-1960 (58 y.o. M) Treating RN: Deon Pilling Primary Care Provider: Sinclair Ship Other Clinician: Referring Provider: Treating Provider/Extender:Robson, Gustavo Lah, Cynda Familia in Treatment: 3 Debridement Performed for Wound #4 Left,Medial Foot Assessment: Performed By: Physician Ricard Dillon., MD Debridement Type: Debridement Level of Consciousness (Pre- Awake and Alert procedure): Pre-procedure Yes - 12:29 Verification/Time Out Taken: Start Time: 12:30 Pain Control: Lidocaine 4% Topical Solution Total Area Debrided (L x W): 1.3 (cm) x 2.5 (cm) = 3.25 (cm) Tissue and other material Viable, Non-Viable, Slough, Subcutaneous, Skin: Dermis , Fibrin/Exudate, Slough debrided: Level: Skin/Subcutaneous Tissue Debridement Description: Excisional Instrument: Curette Bleeding: Moderate Hemostasis Achieved: Pressure End Time: 12:36 Procedural Pain: 0 Post Procedural Pain: 0 Response to Treatment: Procedure was tolerated well Level of Consciousness Awake and Alert (Post-procedure): Post Debridement Measurements of Total Wound Length: (cm) 1.3 Width: (cm) 1.5 Depth: (cm) 0.1 Volume: (cm) 0.153 Character of Wound/Ulcer Post Improved Debridement: Post Procedure Diagnosis Same as Pre-procedure Electronic Signature(s) Signed: 01/31/2019 5:49:31 PM By: Linton Ham MD Signed: 01/31/2019 6:06:48 PM By: Deon Pilling Entered By: Linton Ham on 01/31/2019 12:51:32 -------------------------------------------------------------------------------- Debridement Details Patient Name: Date of Service: Jonathan [name], Jonathan [name] 01/31/2019 11:30 AM Medical Record [id number] Patient Account Number: [id number] Date of Birth/Sex: [birth year] (58 y.o.  M) Treating RN: Deon Pilling Primary Care Provider: Sinclair Ship Other Clinician: Referring Provider: Treating Provider/Extender:Robson, Gustavo Lah, Cynda Familia in Treatment: 3 Debridement Performed for Wound #6 Left,Anterior Lower Leg Assessment: Performed By: Physician Ricard Dillon., MD Debridement Type: Debridement Severity of Tissue Pre Fat layer exposed Debridement: Level of Consciousness (Pre- Awake and Alert procedure): Pre-procedure Yes - 12:29 Verification/Time Out Taken: Start Time: 12:30 Pain Control: Lidocaine 4% Topical Solution Total Area Debrided (L x W): 1 (cm) x 0.7 (cm) = 0.7 (cm) Tissue and other material Viable, Non-Viable, Slough, Subcutaneous, Skin: Dermis , Fibrin/Exudate, Slough debrided: Level: Skin/Subcutaneous Tissue Debridement Description: Excisional Instrument: Curette Bleeding: Moderate Hemostasis Achieved: Pressure End Time: 12:36 Procedural Pain: 0 Post Procedural Pain: 0 Response to Treatment: Procedure was tolerated well Level of Consciousness Awake and Alert (Post-procedure): Post Debridement Measurements of Total Wound Length: (cm) 1 Width: (cm) 0.7 Depth: (cm) 0.1 Volume: (cm) 0.055 Character of Wound/Ulcer Post Improved Debridement: Severity of Tissue Post Debridement: Fat layer exposed Post Procedure Diagnosis Same as Pre-procedure Electronic Signature(s) Signed: 01/31/2019 5:49:31 PM By: Linton Ham MD Signed: 01/31/2019 6:06:48 PM By: Deon Pilling Entered By: Linton Ham on 01/31/2019 12:51:42 -------------------------------------------------------------------------------- HPI Details Patient Name: Date of Service: Jonathan, [name] 01/31/2019 11:30 AM Medical Record [id number] Patient Account Number: [id number] Date of Birth/Sex: Treating RN: 12-18-1960 (58 y.o. [name] Primary Care Provider: Sinclair Ship Other Clinician: Referring Provider: Treating Provider/Extender:Robson,  Gustavo Lah, Cynda Familia in Treatment: 3 History of Present Illness HPI Description: ADMISSION 01/10/2019 This is a 58 year old man who has rheumatoid arthritis on Remicade. He is not a diabetic. He had forefoot surgery by Dr. Bettye Boeck on June 25 for correction of hammertoes. At some point in the postop follow-up he presented with multiple wounds on the left foot. I do not really have a good description at this point but I will try to look through [facility] health link. He had arterial studies on 11/28/2018 that were within normal limits. Seen by podiatry on 8/24 with multiple left foot and lower extremity ulcers. He was treated with Bactrim and Cipro.  He was hospitalized from 8/28 through 8/30 with wounds on the left anterior foot. An MRI was negative at that point the wounds were on the left anterior and left medial foot. Felt to have cellulitis. He saw his primary doctor in follow-up on 9/3 and felt to have pressure ulcers on the foot. The patient states that he developed these after some acute swelling last month. This may have been a result of infection. He comes in today with multiple wounds on his toes dorsal feet and distal lower extremities anteriorly. The exact cause of this is not completely clear. He does not have a prior wound history. He has multiple lower extremity wounds including the left medial foot, left dorsal foot, left second toe, left anterior mid tibia, right dorsal foot, right fourth toe medially and the right medial lower leg. The areas on the left medial foot and left dorsal foot are large superficial wounds most of the rest of these are small punched out looking areas. He does not complain of a lot of pain Past medical history; hypertension, rheumatoid arthritis on Remicade infusions, hyperlipidemia right forefoot surgery on 6/25, he is on Xarelto at this point for reasons that are not totally clear. Arterial studies on 11/28/2018 showed an ABI of 0.98 on the right 1.01  on the left. TBI's of 0.79 on the right 0.75 on the left and triphasic waveforms bilaterally 9/24; wounds on his bilateral lower feet and lower legs. We have been using silver collagen on the wounds. 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 Follows with Dr. Raliegh Ip" rheumatology at [facility] 10/1; patient with wounds on his bilateral lower feet and lower legs we have been using silver collagen. 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 He does not have macrovascular disease by previous noninvasive studies in August. 10/8; patient with bite wounds on his bilateral lower feet and legs. Small punched out areas. I have no doubt he has chronic venous disease however these wounds do not look like classic venous ulcers. He is generally been making good improvements. Electronic Signature(s) Signed: 01/31/2019 5:49:31 PM By: Linton Ham MD Entered By: Linton Ham on 01/31/2019 12:54:48 -------------------------------------------------------------------------------- Chemical Cauterization Details Patient Name: Date of Service: Jonathan [name], Jonathan [name] 01/31/2019 11:30 AM Medical Record [id number] Patient Account Number: [id number] Date of Birth/Sex: Treating RN: [birth year] (58 y.o. [name] Primary Care Provider: Sinclair Ship Other Clinician: Referring Provider: Treating Provider/Extender:Robson, Gustavo Lah, Cynda Familia in Treatment: 3 Procedure Performed for: Wound #1 Right,Medial Toe Fourth Performed By: Physician Ricard Dillon., MD Post Procedure Diagnosis Same as Pre-procedure Notes silver nitrate used. Electronic Signature(s) Signed: 01/31/2019 5:49:31 PM By: Linton Ham MD Entered By: Linton Ham on 01/31/2019  12:50:16 -------------------------------------------------------------------------------- Physical Exam Details Patient Name: Date of Service: Jonathan [name], Jonathan [name] 01/31/2019 11:30 AM Medical Record [id number] Patient Account Number: [id number] Date of Birth/Sex: Treating RN: Mar 12, 1961 (58 y.o. [name] Primary Care Provider: Sinclair Ship Other Clinician: Referring Provider: Treating Provider/Extender:Robson, Gustavo Lah, Cynda Familia in Treatment: 3 Constitutional Sitting or standing Blood Pressure is within target range for patient.. Pulse regular and within target range for patient.Marland Kitchen Respirations regular, non-labored and within target range.. Temperature is normal and within the target range for the patient.Marland Kitchen Appears in no distress. Respiratory work of breathing is normal. Cardiovascular Palpable.. Integumentary (Hair, Skin) And  changes in the lower extremity looks like chronic venous insufficiency/venous inflammation. Psychiatric appears at normal baseline. Notes Wound exam; multiple wounds. Some of these actually look some better. On the right the right fourth toe is hyper granulated which I cauterized with silver nitrate. I also debrided the left medial foot and the left lower calf. Hemostasis with direct pressure Electronic Signature(s) Signed: 01/31/2019 5:49:31 PM By: Linton Ham MD Entered By: Linton Ham on 01/31/2019 13:02:50 -------------------------------------------------------------------------------- Physician Orders Details Patient Name: Date of Service: TAWHID, CORSO 01/31/2019 11:30 AM Medical Record [id number] Patient Account Number: [id number] Date of Birth/Sex: Treating RN: 1960/11/18 (58 y.o. [name] Primary Care Provider: Sinclair Ship Other Clinician: Referring Provider: Treating Provider/Extender:Robson, Gustavo Lah, Cynda Familia in Treatment: 3 Verbal / Phone Orders: No Diagnosis Coding ICD-10 Coding Code  Description L97.521 Non-pressure chronic ulcer of other part of left foot limited to breakdown of skin L97.821 Non-pressure chronic ulcer of other part of left lower leg limited to breakdown of skin L97.511 Non-pressure chronic ulcer of other part of right foot limited to breakdown of skin L97.812 Non-pressure chronic ulcer of other part of right lower leg with fat layer exposed I87.331 Chronic venous hypertension (idiopathic) with ulcer and inflammation of right lower extremity M05.80 Other rheumatoid arthritis with rheumatoid factor of unspecified site Follow-up Appointments Return Appointment in 1 week. Dressing Change Frequency Change dressing three times week. - twice a week by home health and once a week by wound center. Skin Barriers/Peri-Wound Care TCA Cream or Ointment - liberally to both legs, feet, toes, and all wounds in clinic and at home by home health. Wound Cleansing Clean wound with Wound Cleanser Primary Wound Dressing Wound #1 Right,Medial Toe Fourth Silver Collagen - moisten with saline or hydrogel. Wound #2 Right,Dorsal Foot Silver Collagen - moisten with saline or hydrogel. Wound #3 Right,Medial Lower Leg Silver Collagen - moisten with saline or hydrogel. Wound #4 Left,Medial Foot Silver Collagen - moisten with saline or hydrogel. Wound #5 Left,Dorsal Foot Silver Collagen - moisten with saline or hydrogel. Wound #6 Left,Anterior Lower Leg Silver Collagen - moisten with saline or hydrogel. Secondary Dressing Wound #1 Right,Medial Toe Fourth Kerlix/Rolled Gauze Dry Gauze Wound #2 Right,Dorsal Foot Dry Gauze ABD pad Wound #3 Right,Medial Lower Leg Dry Gauze ABD pad Wound #4 Left,Medial Foot Dry Gauze ABD pad Wound #5 Left,Dorsal Foot Dry Gauze ABD pad Wound #6 Left,Anterior Lower Leg Dry Gauze ABD pad Edema Control 3 Layer Compression System - Bilateral Avoid standing for long periods of time Elevate legs to the level of the heart or above for 30  minutes daily and/or when sitting, a frequency of: - throughout the day. [facility] for wound care. - [facility]. Electronic Signature(s) Signed: 01/31/2019 5:49:31 PM By: Linton Ham MD Signed: 01/31/2019 6:06:48 PM By: Deon Pilling Entered By: Deon Pilling on 01/31/2019 12:37:17 -------------------------------------------------------------------------------- Problem List Details Patient Name: Date of Service: TALIESIN, STANO 01/31/2019 11:30 AM Medical Record [id number] Patient Account Number: [id number] Date of Birth/Sex: Treating RN: 12/05/1960 (58 y.o. [name] Primary Care Provider: Sinclair Ship Other Clinician: Referring Provider: Treating Provider/Extender:Robson, Gustavo Lah, Cynda Familia in Treatment: 3 Active Problems ICD-10 Evaluated Encounter Code Description Active Date Today Diagnosis L97.521 Non-pressure chronic ulcer of other part of left foot 01/10/2019 No Yes limited to breakdown of skin L97.821 Non-pressure chronic ulcer of other part of left lower 01/10/2019 No Yes leg limited to breakdown of skin L97.511 Non-pressure chronic ulcer of other part of right  foot 01/10/2019 No Yes limited to breakdown of skin L97.812 Non-pressure chronic ulcer of other part of right lower 01/10/2019 No Yes leg with fat layer exposed I87.331 Chronic venous hypertension (idiopathic) with ulcer 01/10/2019 No Yes and inflammation of right lower extremity M05.80 Other rheumatoid arthritis with rheumatoid factor of 01/10/2019 No Yes unspecified site Inactive Problems Resolved Problems Electronic Signature(s) Signed: 01/31/2019 5:49:31 PM By: Linton Ham MD Entered By: Linton Ham on 01/31/2019 12:49:18 -------------------------------------------------------------------------------- Progress Note Details Patient Name: Date of Service: BURLEN, MACARI 01/31/2019 11:30 AM Medical Record [id number] Patient  Account Number: [id number] Date of Birth/Sex: Treating RN: 1961/03/02 (58 y.o. [name] Primary Care Provider: Sinclair Ship Other Clinician: Referring Provider: Treating Provider/Extender:Robson, Gustavo Lah, Cynda Familia in Treatment: 3 Subjective History of Present Illness (HPI) ADMISSION 01/10/2019 This is a 59 year old man who has rheumatoid arthritis on Remicade. He is not a diabetic. He had forefoot surgery by Dr. Bettye Boeck on June 25 for correction of hammertoes. At some point in the postop follow-up he presented with multiple wounds on the left foot. I do not really have a good description at this point but I will try to look through [facility] health link. He had arterial studies on 11/28/2018 that were within normal limits. Seen by podiatry on 8/24 with multiple left foot and lower extremity ulcers. He was treated with Bactrim and Cipro. He was hospitalized from 8/28 through 8/30 with wounds on the left anterior foot. An MRI was negative at that point the wounds were on the left anterior and left medial foot. Felt to have cellulitis. He saw his primary doctor in follow-up on 9/3 and felt to have pressure ulcers on the foot. The patient states that he developed these after some acute swelling last month. This may have been a result of infection. He comes in today with multiple wounds on his toes dorsal feet and distal lower extremities anteriorly. The exact cause of this is not completely clear. He does not have a prior wound history. He has multiple lower extremity wounds including the left medial foot, left dorsal foot, left second toe, left anterior mid tibia, right dorsal foot, right fourth toe medially and the right medial lower leg. The areas on the left medial foot and left dorsal foot are large superficial wounds most of the rest of these are small punched out looking areas. He does not complain of a lot of pain Past medical history; hypertension, rheumatoid arthritis on  Remicade infusions, hyperlipidemia right forefoot surgery on 6/25, he is on Xarelto at this point for reasons that are not totally clear. Arterial studies on 11/28/2018 showed an ABI of 0.98 on the right 1.01 on the left. TBI's of 0.79 on the right 0.75 on the left and triphasic waveforms bilaterally 9/24; wounds on his bilateral lower feet and lower legs. We have been using silver collagen on the wounds. 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 Follows with Dr. Raliegh Ip" rheumatology at [facility]. 10/1; patient with wounds on his bilateral lower feet and lower legs we have been using silver collagen. 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 He does not have macrovascular disease by previous noninvasive studies in August. 10/8; patient with bite wounds on his bilateral lower feet and legs. Small punched out areas. I have no doubt he has  chronic venous disease however these wounds do not look like classic venous ulcers. He is generally been making good improvements. Objective Constitutional Sitting or standing Blood Pressure is within target range for patient.. Pulse regular and within target range for patient.Marland Kitchen Respirations regular, non-labored and within target range.. Temperature is normal and within the target range for the patient.Marland Kitchen Appears in no distress. Vitals Time Taken: 12:00 PM, Height: 63 in, Weight: 162 lbs, BMI: 28.7, Temperature: 98.5 F, Pulse: 87 bpm, Respiratory Rate: 18 breaths/min, Blood Pressure: 115/75 mmHg. Respiratory work of breathing is normal. Cardiovascular Palpable.Marland Kitchen Psychiatric appears at normal baseline. General Notes: Wound exam; multiple wounds. Some of these actually look some better. On the right the right fourth toe is hyper  granulated which I cauterized with silver nitrate. I also debrided the left medial foot and the left lower calf. Hemostasis with direct pressure Integumentary (Hair, Skin) And changes in the lower extremity looks like chronic venous insufficiency/venous inflammation. Wound #1 status is Open. Original cause of wound was Gradually Appeared. The wound is located on the Right,Medial Toe Fourth. The wound measures 0.5cm length x 0.6cm width x 0.1cm depth; 0.236cm^2 area and 0.024cm^3 volume. There is Fat Layer (Subcutaneous Tissue) Exposed exposed. There is no tunneling or undermining noted. There is a small amount of serosanguineous drainage noted. The wound margin is flat and intact. There is large (67-100%) red granulation within the wound bed. There is no necrotic tissue within the wound bed. Wound #2 status is Open. Original cause of wound was Gradually Appeared. The wound is located on the Right,Dorsal Foot. The wound measures 0.1cm length x 0.1cm width x 0.2cm depth; 0.008cm^2 area and 0.002cm^3 volume. There is Fat Layer (Subcutaneous Tissue) Exposed exposed. There is no tunneling or undermining noted. There is a small amount of serosanguineous drainage noted. The wound margin is well defined and not attached to the wound base. There is large (67-100%) pink granulation within the wound bed. There is no necrotic tissue within the wound bed. Wound #3 status is Open. Original cause of wound was Gradually Appeared. The wound is located on the Right,Medial Lower Leg. The wound measures 2.5cm length x 2cm width x 0.1cm depth; 3.927cm^2 area and 0.393cm^3 volume. There is Fat Layer (Subcutaneous Tissue) Exposed exposed. There is no tunneling or undermining noted. There is a medium amount of serosanguineous drainage noted. The wound margin is flat and intact. There is medium (34-66%) pink granulation within the wound bed. There is a medium (34-66%) amount of necrotic tissue within the wound bed  including Adherent Slough. General Notes: 3 separate sites, measured as one Wound #4 status is Open. Original cause of wound was Gradually Appeared. The wound is located on the Left,Medial Foot. The wound measures 1.3cm length x 2.5cm width x 0.1cm depth; 2.553cm^2 area and 0.255cm^3 volume. There is Fat Layer (Subcutaneous Tissue) Exposed exposed. There is no tunneling or undermining noted. There is a medium amount of serosanguineous drainage noted. The wound margin is well defined and not attached to the wound base. There is large (67-100%) red granulation within the wound bed. There is a small (1-33%) amount of necrotic tissue within the wound bed including Adherent Slough. Wound #5 status is Open. Original cause of wound was Gradually Appeared. The wound is located on the Left,Dorsal Foot. The wound measures 2.5cm length x 2cm width x 0.1cm depth; 3.927cm^2 area and 0.393cm^3 volume. There is Fat Layer (Subcutaneous Tissue) Exposed exposed. There is no tunneling or undermining noted. There is a medium  amount of serosanguineous drainage noted. The wound margin is well defined and not attached to the wound base. There is medium (34-66%) red granulation within the wound bed. There is a medium (34-66%) amount of necrotic tissue within the wound bed including Adherent Slough. Wound #6 status is Open. Original cause of wound was Gradually Appeared. The wound is located on the Left,Anterior Lower Leg. The wound measures 1cm length x 0.7cm width x 0.1cm depth; 0.55cm^2 area and 0.055cm^3 volume. There is Fat Layer (Subcutaneous Tissue) Exposed exposed. There is no tunneling or undermining noted. There is a small amount of serosanguineous drainage noted. The wound margin is flat and intact. There is medium (34-66%) pink granulation within the wound bed. There is a small (1-33%) amount of necrotic tissue within the wound bed including Adherent Slough. Wound #8 status is Open. Original cause of wound  was Trauma. The wound is located on the Left,Medial,Dorsal Foot. The wound measures 0cm length x 0cm width x 0cm depth; 0cm^2 area and 0cm^3 volume. There is no tunneling or undermining noted. There is a none present amount of drainage noted. The wound margin is distinct with the outline attached to the wound base. There is no granulation within the wound bed. There is no necrotic tissue within the wound bed. 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 There was a Excisional Skin/Subcutaneous Tissue Debridement with a total area of 3.25 sq cm performed by Ricard Dillon., MD. With the following instrument(s): Curette to remove Viable and Non-Viable tissue/material. Material removed includes Subcutaneous Tissue, Slough, Skin: Dermis, and Fibrin/Exudate after achieving pain control using Lidocaine 4% Topical Solution. A time out was conducted at 12:29, prior to the start of the procedure. A Moderate amount of bleeding was controlled with Pressure. The procedure was tolerated well with a pain level of 0 throughout and a pain level of 0 following the procedure. Post Debridement Measurements: 1.3cm length x 1.5cm width x 0.1cm depth; 0.153cm^3 volume. Character of Wound/Ulcer Post Debridement is improved. Post procedure Diagnosis Wound #4: Same as Pre-Procedure Pre-procedure diagnosis of Wound #4 is an  Inflammatory located on the Left,Medial Foot . There was a Three Layer Compression Therapy Procedure with a pre-treatment ABI of 1 by [name], RN. Post procedure Diagnosis Wound #4: Same as Pre-Procedure Wound #6 Pre-procedure diagnosis of Wound #6 is a Venous Leg Ulcer located on the Left,Anterior Lower Leg .Severity of Tissue Pre Debridement is: Fat layer exposed. There was a Excisional Skin/Subcutaneous Tissue Debridement with a total area of 0.7 sq cm performed by Ricard Dillon., MD. With the following instrument(s): Curette to remove Viable and Non-Viable tissue/material. Material removed includes Subcutaneous Tissue, Slough, Skin: Dermis, and Fibrin/Exudate after achieving pain control using Lidocaine 4% Topical Solution. A time out was conducted at 12:29, prior to the start of the procedure. A Moderate amount of bleeding was controlled with Pressure. The procedure was tolerated well with a pain level of 0 throughout and a pain level  of 0 following the procedure. Post Debridement Measurements: 1cm length x 0.7cm width x 0.1cm depth; 0.055cm^3 volume. Character of Wound/Ulcer Post Debridement is improved. Severity of Tissue Post Debridement is: Fat layer exposed. Post procedure Diagnosis Wound #6: Same as Pre-Procedure Pre-procedure diagnosis of Wound #6 is a Venous Leg Ulcer located on the Left,Anterior Lower Leg . There was a Three Layer Compression Therapy Procedure with a pre-treatment ABI of 1 by [name], RN. Post procedure Diagnosis Wound #6: Same as Pre-Procedure Wound #2 Pre-procedure diagnosis of Wound #2 is an Inflammatory located on the Right,Dorsal Foot . There was a Three Layer Compression Therapy Procedure with a pre-treatment ABI of 1 by [name], RN. Post procedure Diagnosis Wound #2: Same as Pre-Procedure Wound #3 Pre-procedure diagnosis of Wound #3 is a Venous Leg Ulcer located on the Right,Medial Lower Leg . There was a Three Layer Compression  Therapy Procedure with a pre-treatment ABI of 1 by [name], RN. Post procedure Diagnosis Wound #3: Same as Pre-Procedure Wound #5 Pre-procedure diagnosis of Wound #5 is an Inflammatory located on the Left,Dorsal Foot . There was a Three Layer Compression Therapy Procedure with a pre-treatment ABI of 1 by [name], RN. Post procedure Diagnosis Wound #5: Same as Pre-Procedure Wound #1 Pre-procedure diagnosis of Wound #1 is an Inflammatory located on the Right,Medial Toe Fourth . An Chemical Cauterization procedure was performed by Ricard Dillon., MD. Post procedure Diagnosis Wound #1: Same as Pre-Procedure Notes: silver nitrate used. Plan Follow-up Appointments: Return Appointment in 1 week. Dressing Change Frequency: Change dressing three times week. - twice a week by home health and once a week by wound center. Skin Barriers/Peri-Wound Care: TCA Cream or Ointment - liberally to both legs, feet, toes, and all wounds in clinic and at home by home health. Wound Cleansing: Clean wound with Wound Cleanser Primary Wound Dressing: Wound #1 Right,Medial Toe Fourth: Silver Collagen - moisten with saline or hydrogel. Wound #2 Right,Dorsal Foot: Silver Collagen - moisten with saline or hydrogel. Wound #3 Right,Medial Lower Leg: Silver Collagen - moisten with saline or hydrogel. Wound #4 Left,Medial Foot: Silver Collagen - moisten with saline or hydrogel. Wound #5 Left,Dorsal Foot: Silver Collagen - moisten with saline or hydrogel. Wound #6 Left,Anterior Lower Leg: Silver Collagen - moisten with saline or hydrogel. Secondary Dressing: Wound #1 Right,Medial Toe Fourth: Kerlix/Rolled Gauze Dry Gauze Wound #2 Right,Dorsal Foot: Dry Gauze ABD pad Wound #3 Right,Medial Lower Leg: Dry Gauze ABD pad Wound #4 Left,Medial Foot: Dry Gauze ABD pad Wound #5 Left,Dorsal Foot: Dry Gauze ABD pad Wound #6 Left,Anterior Lower Leg: Dry Gauze ABD pad Edema Control: 3 Layer  Compression System - Bilateral Avoid standing for long periods of time Elevate legs to the level of the heart or above for 30 minutes daily and/or when sitting, a frequency of: - throughout the day. Home Health: [facility]. - [facility]. 1. Continue TCA 2. Continue silver collagen 3. I will reach out to Dr. Earnest Conroy at [facility] if I can find a way to secure message her. 4. He has home health changing the dressing Electronic Signature(s) Signed: 01/31/2019 5:49:31 PM By: Linton Ham MD Entered By: Linton Ham on 01/31/2019 13:03:51 -------------------------------------------------------------------------------- SuperBill Details Patient Name: Date of Service: RAIYAN, DORRY 01/31/2019 Medical Record [id number] Patient Account Number: [id number] Date of Birth/Sex: Treating RN: 1961/03/13 (58 y.o. 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 chronic ulcer of other part of right lower leg with fat layer exposed I87.331 Chronic venous hypertension (idiopathic) with ulcer and inflammation of right lower extremity M05.80 Other rheumatoid arthritis with rheumatoid factor of unspecified site Facility Procedures The patient participates with Medicare or their insurance follows the Medicare Facility Guidelines: CPT4 Code Description Modifier Quantity [id number] - DEB SUBQ TISSUE 20 SQ CM/< 1 ICD-10 Diagnosis Description L97.521 Non-pressure chronic ulcer of  other part of  left foot limited to breakdown of skin The patient participates with Medicare or their insurance follows the Medicare Facility Guidelines: [id number] - CHEM CAUT GRANULATION TISS 59 1 ICD-10 Diagnosis Description L97.511 Non-pressure chronic ulcer of other part of right foot limited to  breakdown of skin [id number] (Facility Use Only) [id number] - APPLY MULTLAY COMPRS LWR RT LEG 59 1 Physician Procedures CPT4 Code Description: [id number] - WC PHYS SUBQ TISS 20 SQ CM ICD-10 Diagnosis Description L97.521 Non-pressure chronic ulcer of other part of left foot limi Modifier: ted to breakdow Quantity: 1 n of skin CPT4 Code Description: [id number] - WC PHYS CHEM CAUT GRAN TISSUE ICD-10 Diagnosis Description L97.511 Non-pressure chronic ulcer of other part of right foot lim Modifier: 59 ited to breakdo Quantity: 1 wn of skin Electronic Signature(s) Signed: 01/31/2019 5:49:31 PM By: Linton Ham MD Entered By: Linton Ham on 01/31/2019 13:04:20

## 2019-01-31 NOTE — Progress Notes (Addendum)
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 Description Full Thickness Without Exposed Support Foul Odo Classification: Structures Slough/F Wound  Flat and Intact Margin: Exudate Medium Amount: Exudate Serosanguineous Type: Exudate red, brown Color: Wound Bed Granulation Amount: Medium (34-66%) Granulation Quality: Pink Fascia E Necrotic Amount: Medium (34-66%) Fat Laye Necrotic Quality: Adherent Slough Tendon E Muscle Exp Joint Expo Bone Expos r After Cleansing: No ibrino Yes Exposed Structure xposed: No r (Subcutaneous Tissue) Exposed: Yes xposed: No osed: No sed: No ed: No ion in Area: -257% ion in Volume: -257.3% alization: None ng: No ning: No Assessment Notes 3 separate sites, measured as one Treatment Notes Wound #3 (Right, Medial Lower Leg) 1. Cleanse  With Wound Cleanser Soap and water 2. Periwound Care TCA Cream 3. Primary Dressing Applied Collegen AG Hydrogel or K-Y Jelly 4. Secondary Dressing Dry Gauze 6. Support Layer Applied 3 layer compression wrap Electronic Signature(s) Signed: 02/01/2019 5:13:27 PM By: Kela Millin Signed: 02/04/2019 3:43:33 PM By: Mikeal Hawthorne EMT/HBOT Previous Signature: 01/31/2019 4:29:59 PM Version By: Kela Millin Entered By: Mikeal Hawthorne on 02/01/2019 08:31:39 -------------------------------------------------------------------------------- Wound Assessment Details Patient Name: Date of Service: AUBERT, OKINO 01/31/2019 11:30 AM Medical Record [id number] Patient Account Number: [id number] Date of Birth/Sex: Treating RN: 1960/07/05 (58 y.o. [name] Primary Care Ariana Juul: [employer] Other Clinician: Referring Tayshawn Purnell: Treating Cashae Weich/Extender:Robson, Gustavo Lah, Cynda Familia in Treatment: 3 Wound Status Wound Number: 4 Primary Etiology: Inflammatory Wound Location: Left Foot - Medial Wound Status: Open Wounding Event: Gradually Appeared Comorbid History: Rheumatoid Arthritis, Osteoarthritis Date Acquired: 11/24/2018 Weeks Of Treatment: 3 Clustered Wound: No Photos Wound Measurements Length: (cm) 1.3 % Reduct Width: (cm) 2.5 % Reduct Depth: (cm) 0.1 Epitheli Area: (cm) 2.553 Tunneli Volume: (cm) 0.255 Undermi Wound Description Classification: Full Thickness Without Exposed Support Foul Odo Structures Slough/F Wound Well defined, not attached Margin: Exudate Medium Amount: Exudate Serosanguineous Type: Exudate red, brown Color: Wound Bed Granulation Amount: Large (67-100%) Granulation Quality: Red Fascia E Necrotic Amount: Small (1-33%) Fat Laye Necrotic Quality: Adherent Slough Tendon E Muscle E Joint Ex Bone Exp r After Cleansing: No ibrino Yes Exposed Structure xposed: No r (Subcutaneous Tissue) Exposed: Yes xposed:  No xposed: No posed: No osed: No ion in Area: 52.9% ion in Volume: 53% alization: Small (1-33%) ng: No ning: No Treatment Notes Wound #4 (Left, Medial Foot) 1. Cleanse With Wound Cleanser Soap and water 2. Periwound Care TCA Cream 3. Primary Dressing Applied Collegen AG Hydrogel or K-Y Jelly 4. Secondary Dressing Dry Gauze 6. Support Layer Applied 3 layer compression wrap Electronic Signature(s) Signed: 02/01/2019 5:13:27 PM By: Kela Millin Signed: 02/04/2019 3:43:33 PM By: Mikeal Hawthorne EMT/HBOT Previous Signature: 01/31/2019 4:29:59 PM Version By: Kela Millin Entered By: Mikeal Hawthorne on 02/01/2019 08:31:57 -------------------------------------------------------------------------------- Wound Assessment Details Patient Name: Date of Service: MALACHY, VLASIC 01/31/2019 11:30 AM Medical Record [id number] Patient Account Number: [id number] Date of Birth/Sex: Treating RN: [birth year] (58 y.o. [name] Primary Care Garo Heidelberg: [employer] Other Clinician: Referring Suleima Ohlendorf: Treating Laberta Wilbon/Extender:Robson, Gustavo Lah, Cynda Familia in Treatment: 3 Wound Status Wound Number: 5 Primary Etiology: Inflammatory Wound Location: Left Foot - Dorsal Wound Status: Open Wounding Event: Gradually Appeared Comorbid History: Rheumatoid Arthritis, Osteoarthritis Date Acquired: 11/24/2018 Weeks Of Treatment: 3 Clustered Wound: No Photos Wound Measurements Length: (cm) 2.5 % Reduct Width: (cm) 2 % Reduct Depth: (cm) 0.1 Epitheli Area: (cm) 3.927 Tunneli Volume: (cm) 0.393 Undermi Wound Description Full Thickness Without Exposed Support Foul Odo Classification: Structures Slough/F Wound Well defined, not attached Margin: Exudate Medium Amount: Exudate Serosanguineous Type: Exudate red, brown Color: Wound Bed Granulation Amount: Medium (34-66%) Granulation Quality: Red Fascia E Necrotic  Amount: Medium (34-66%) Fat Laye Necrotic  Quality: Adherent Slough Tendon E Muscle Exp Joint Expo Bone Expos r After Cleansing: No ibrino Yes Exposed Structure xposed: No r (Subcutaneous Tissue) Exposed: Yes xposed: No osed: No sed: No ed: No ion in Area: 58.9% ion in Volume: 58.8% alization: Small (1-33%) ng: No ning: No Treatment Notes Wound #5 (Left, Dorsal Foot) 1. Cleanse With Wound Cleanser Soap and water 2. Periwound Care TCA Cream 3. Primary Dressing Applied Collegen AG Hydrogel or K-Y Jelly 4. Secondary Dressing Dry Gauze 6. Support Layer Applied 3 layer compression wrap Electronic Signature(s) Signed: 02/01/2019 5:13:27 PM By: Kela Millin Signed: 02/04/2019 3:43:33 PM By: Mikeal Hawthorne EMT/HBOT Previous Signature: 01/31/2019 4:29:59 PM Version By: Kela Millin Entered By: Mikeal Hawthorne on 02/01/2019 08:32:59 -------------------------------------------------------------------------------- Wound Assessment Details Patient Name: Date of Service: CRESTON, CANUTO 01/31/2019 11:30 AM Medical Record [id number] Patient Account Number: [id number] Date of Birth/Sex: Treating RN: July 20, 1960 (58 y.o. [name] Primary Care Lillyauna Jenkinson: [employer] Other Clinician: Referring Meleane Selinger: Treating Domenique Southers/Extender:Robson, Gustavo Lah, Cynda Familia in Treatment: 3 Wound Status Wound Number: 6 Primary Etiology: Venous Leg Ulcer Wound Location: Left Lower Leg - Anterior Wound Status: Open Wounding Event: Gradually Appeared Comorbid History: Rheumatoid Arthritis, Osteoarthritis Date Acquired: 11/24/2018 Weeks Of Treatment: 3 Clustered Wound: No Photos Wound Measurements Length: (cm) 1 % Reduct Width: (cm) 0.7 % Reduct Depth: (cm) 0.1 Epitheli Area: (cm) 0.55 Tunneli Volume: (cm) 0.055 Undermi Wound Description Full Thickness Without Exposed Support Foul Odo Classification: Structures Slough/F Wound Flat and  Intact Margin: Exudate Small Amount: Exudate Serosanguineous Type: Exudate red, brown Color: Wound Bed Granulation Amount: Medium (34-66%) Granulation Quality: Pink Fascia E Necrotic Amount: Small (1-33%) Fat Laye Necrotic Quality: Adherent Slough Tendon E Muscle E Joint Ex Bone Exp r After Cleansing: No ibrino Yes Exposed Structure xposed: No r (Subcutaneous Tissue) Exposed: Yes xposed: No xposed: No posed: No osed: No ion in Area: 16.7% ion in Volume: 16.7% alization: None ng: No ning: No Treatment Notes Wound #6 (Left, Anterior Lower Leg) 1. Cleanse With Wound Cleanser Soap and water 2. Periwound Care TCA Cream 3. Primary Dressing Applied Collegen AG Hydrogel or K-Y Jelly 4. Secondary Dressing Dry Gauze 6. Support Layer Applied 3 layer compression wrap Electronic Signature(s) Signed: 02/01/2019 5:13:27 PM By: Kela Millin Signed: 02/04/2019 3:43:33 PM By: Mikeal Hawthorne EMT/HBOT Previous Signature: 01/31/2019 4:29:59 PM Version By: Kela Millin Entered By: Mikeal Hawthorne on 02/01/2019 08:32:17 -------------------------------------------------------------------------------- Wound Assessment Details Patient Name: Date of Service: FATHI, DESCHEPPER 01/31/2019 11:30 AM Medical Record [id number] Patient Account Number: [id number] Date of Birth/Sex: Treating RN: Jan 29, 1961 (58 y.o. [name] Primary Care Christopherjame Carnell: [employer] Other Clinician: Referring Danisa Kopec: Treating Avory Mimbs/Extender:Robson, Gustavo Lah, Cynda Familia in Treatment: 3 Wound Status Wound Number: 8 Primary Etiology: Venous Leg Ulcer Wound Location: Left Foot - Dorsal, Medial Wound Status: Healed - Epithelialized Wounding Event: Trauma Comorbid History: Rheumatoid Arthritis, Osteoarthritis Date Acquired: 12/10/2018 Weeks Of Treatment: 3 Clustered Wound: No Photos Wound Measurements Length: (cm) 0 % Reduct Width: (cm) 0 % Reduct Depth: (cm) 0  Epitheli Area: (cm) 0 Tunneli Volume: (cm) 0 Undermi Wound Description Full Thickness Without Exposed Support Foul Odo Classification: Structures Slough/F Wound Distinct, outline attached Margin: Exudate None Present Amount: Wound Bed Granulation Amount: None Present (0%) Necrotic Amount: None Present (0%) Fascia E Fat Laye Tendon E Muscle E Joint Ex Bone Exp r After Cleansing: No ibrino No Exposed Structure xposed: No r (Subcutaneous Tissue) Exposed: No xposed: No xposed: No posed: No osed:  No ion in Area: 100% ion in Volume: 100% alization: Large (67-100%) ng: No ning: No Electronic Signature(s) Signed: 02/01/2019 5:13:27 PM By: Kela Millin Signed: 02/04/2019 3:43:33 PM By: Mikeal Hawthorne EMT/HBOT Previous Signature: 01/31/2019 4:29:59 PM Version By: Kela Millin Entered By: Mikeal Hawthorne on 02/01/2019 08:32:41 -------------------------------------------------------------------------------- Vitals Details Patient Name: Date of Service: HIROKI, HYPPOLITE 01/31/2019 11:30 AM Medical Record [id number] Patient Account Number: [id number] Date of Birth/Sex: Treating RN: 06/04/1960 (59 y.o. [name] Primary Care Jaimes Eckert: [employer] Other Clinician: Referring Arvid Marengo: Treating Travonta Gill/Extender:Robson, Gustavo Lah, Cynda Familia in Treatment: 3 Vital Signs Time Taken: 12:00 Temperature (F): 98.5 Height (in): 63 Pulse (bpm): 87 Weight (lbs): 162 Respiratory Rate (breaths/min): 18 Body Mass Index (BMI): 28.7 Blood Pressure (mmHg): 115/75 Reference Range: 80 - 120 mg / dl Electronic Signature(s) Signed: 01/31/2019 4:29:59 PM By: Kela Millin Entered By: Kela Millin on 01/31/2019 12:07:52

## 2019-02-01 NOTE — Progress Notes (Signed)
Jonathan, [name] ([id number]) Visit Report for 01/24/2019 Arrival Information Details Patient Name: Date of Service: Jonathan [name], Jonathan [name] 01/24/2019 3:45 PM Medical Record [id number] Patient Account Number: [id number] Date of Birth/Sex: Treating RN: 11-23-1960 (58 y.o. [name] Primary Care Shaterica Mcclatchy: [employer] Other Clinician: Referring Teandre Hamre: Treating Melodi Happel/Extender:Robson, Gustavo Lah, Cynda Familia in Treatment: 2 Visit Information History Since Last Visit Added or deleted any medications: No Patient Arrived: Crutches Any new allergies or adverse reactions: No Arrival Time: 16:22 Had a fall or experienced change in No Accompanied By: alone activities of daily living that may affect Transfer Assistance: None risk of falls: Patient Identification Verified: Yes Signs or symptoms of abuse/neglect since last No Secondary Verification Process Yes visito Completed: Hospitalized since last visit: No Patient Requires Transmission- No Implantable device outside of the clinic excluding No Based Precautions: cellular tissue based products placed in the center Patient Has Alerts: Yes since last visit: Patient Alerts: Patient on Blood Has Dressing in Place as Prescribed: Yes Thinner Has Compression in Place as Prescribed: Yes ABI Right .98 Pain Present Now: Yes ABI Left 1.01 Electronic Signature(s) Signed: 01/28/2019 6:17:12 PM By: [name] RN, BSN Entered By: [name] on 01/24/2019 16:23:19 -------------------------------------------------------------------------------- Compression Therapy Details Patient Name: Date of Service: Jonathan [name], Jonathan [name] 01/24/2019 3:45 PM Medical Record [id number] Patient Account Number: [id number] Date of Birth/Sex: Treating RN: 02-10-1961 (58 y.o. [name] Primary Care Kadir Azucena: [employer] Other Clinician: Referring Aubriauna Riner: Treating Genine Beckett/Extender:Robson, Gustavo Lah, Cynda Familia in Treatment:  2 Compression Therapy Performed for Wound Wound #3 Right,Medial Lower Leg Assessment: Performed By: Clinician Baruch Gouty, RN Compression Type: Three Layer Pre Treatment ABI: 1 Post Procedure Diagnosis Same as Pre-procedure Electronic Signature(s) Signed: 01/24/2019 6:31:45 PM By: Deon Pilling Entered By: Deon Pilling on 01/24/2019 17:18:49 -------------------------------------------------------------------------------- Compression Therapy Details Patient Name: Date of Service: Jonathan [name], Jonathan [name] 01/24/2019 3:45 PM Medical Record [id number] Patient Account Number: [id number] Date of Birth/Sex: Treating RN: [birth year] (58 y.o. [name] Primary Care Seeley Hissong: [employer] Other Clinician: Referring Edris Friedt: Treating Breckyn Troyer/Extender:Robson, Gustavo Lah, Cynda Familia in Treatment: 2 Compression Therapy Performed for Wound Wound #2 Right,Dorsal Foot Assessment: Performed By: Clinician Baruch Gouty, RN Compression Type: Three Layer Pre Treatment ABI: 1 Post Procedure Diagnosis Same as Pre-procedure Electronic Signature(s) Signed: 01/24/2019 6:31:45 PM By: Deon Pilling Entered By: Deon Pilling on 01/24/2019 17:18:50 -------------------------------------------------------------------------------- Compression Therapy Details Patient Name: Date of Service: Jonathan [name], Jonathan [name] 01/24/2019 3:45 PM Medical Record [id number] Patient Account Number: [id number] Date of Birth/Sex: Treating RN: [birth year] (58 y.o. [name] Primary Care Monterrio Gerst: [employer] Other Clinician: Referring Aashrith Eves: Treating Omolara Carol/Extender:Robson, Gustavo Lah, Cynda Familia in Treatment: 2 Compression Therapy Performed for Wound Wound #4 Left,Medial Foot Assessment: Performed By: Clinician Baruch Gouty, RN Compression Type: Three Layer Pre Treatment ABI: 1 Post Procedure Diagnosis Same as Pre-procedure Electronic Signature(s) Signed: 01/24/2019 6:31:45 PM By:  Deon Pilling Entered By: Deon Pilling on 01/24/2019 17:18:50 -------------------------------------------------------------------------------- Compression Therapy Details Patient Name: Date of Service: SHIRRELL, PRUETTE 01/24/2019 3:45 PM Medical Record [id number] Patient Account Number: [id number] Date of Birth/Sex: Treating RN: 04-03-1961 (58 y.o. [name] Primary Care Shemiah Rosch: [employer] Other Clinician: Referring Mignon Bechler: Treating Caswell Alvillar/Extender:Robson, Gustavo Lah, Cynda Familia in Treatment: 2 Compression Therapy Performed for Wound Wound #5 Left,Dorsal Foot Assessment: Performed By: Clinician Baruch Gouty, RN Compression Type: Three Layer Pre Treatment ABI: 1 Post Procedure Diagnosis Same as Pre-procedure Electronic Signature(s) Signed: 01/24/2019 6:31:45 PM By: Deon Pilling Entered By: Deon Pilling on 01/24/2019 17:18:50 -------------------------------------------------------------------------------- Compression Therapy Details Patient Name: Date of  Service: Jonathan [name], Jonathan [name] 01/24/2019 3:45 PM Medical Record [id number] Patient Account Number: [id number] Date of Birth/Sex: Treating RN: 12/15/1960 (58 y.o. [name] Primary Care Briani Maul: [employer] Other Clinician: Referring Abdulwahab Demelo: Treating Suleika Donavan/Extender:Robson, Gustavo Lah, Cynda Familia in Treatment: 2 Compression Therapy Performed for Wound Wound #6 Left,Anterior Lower Leg Assessment: Performed By: Clinician Baruch Gouty, RN Compression Type: Three Layer Pre Treatment ABI: 1 Post Procedure Diagnosis Same as Pre-procedure Electronic Signature(s) Signed: 01/24/2019 6:31:45 PM By: Deon Pilling Entered By: Deon Pilling on 01/24/2019 17:18:50 -------------------------------------------------------------------------------- Compression Therapy Details Patient Name: Date of Service: Jonathan, Jonathan [name] 01/24/2019 3:45 PM Medical Record Jonathan [name] Patient Account  Number: [id number] Date of Birth/Sex: Treating RN: Aug 13, 1960 (58 y.o. [name] Primary Care Jamien Casanova: [employer] Other Clinician: Referring Brandy Kabat: Treating Chenille Toor/Extender:Robson, Gustavo Lah, Cynda Familia in Treatment: 2 Compression Therapy Performed for Wound Wound #8 Left,Medial,Dorsal Foot Assessment: Performed By: Clinician Baruch Gouty, RN Compression Type: Three Layer Pre Treatment ABI: 1 Post Procedure Diagnosis Same as Pre-procedure Electronic Signature(s) Signed: 01/24/2019 6:31:45 PM By: Deon Pilling Entered By: Deon Pilling on 01/24/2019 17:18:50 -------------------------------------------------------------------------------- Encounter Discharge Information Details Patient Name: Date of Service: Jonathan [name], Jonathan [name] 01/24/2019 3:45 PM Medical Record Jonathan [name] Patient Account Number: [id number] Date of Birth/Sex: Treating RN: January 05, 1961 (58 y.o. [name] Primary Care Evelene Roussin: [employer] Other Clinician: Referring Oluwatobi Visser: Treating Bert Givans/Extender:Robson, Gustavo Lah, Cynda Familia in Treatment: 2 Encounter Discharge Information Items Discharge Condition: Stable Ambulatory Status: Crutches Discharge Destination: Home Transportation: Private Auto Accompanied By: alone Schedule Follow-up Appointment: Yes Clinical Summary of Care: Patient Declined Electronic Signature(s) Signed: 02/01/2019 5:13:27 PM By: Kela Millin Entered By: Kela Millin on 01/24/2019 17:54:36 -------------------------------------------------------------------------------- Lower Extremity Assessment Details Patient Name: Date of Service: Jonathan [name], Jonathan [name] 01/24/2019 3:45 PM Medical Record Jonathan [name] Patient Account Number: [id number] Date of Birth/Sex: Treating RN: 1960-11-12 (58 y.o. [name] Primary Care Jsoeph Podesta: [employer] Other Clinician: Referring Ariyanna Oien: Treating Kenson Groh/Extender:Robson, Gustavo Lah, Cynda Familia in  Treatment: 2 Edema Assessment Assessed: [Left: No] [Right: No] Edema: [Left: Yes] [Right: Yes] Calf Left: Right: Point of Measurement: 36 cm From Medial Instep 31 cm 29.8 cm Ankle Left: Right: Point of Measurement: 8 cm From Medial Instep 22.5 cm 22.2 cm Vascular Assessment Pulses: Dorsalis Pedis Palpable: [Left:Yes] [Right:Yes] Electronic Signature(s) Signed: 01/28/2019 6:17:12 PM By: [name] RN, BSN Entered By: [name] on 01/24/2019 16:31:32 -------------------------------------------------------------------------------- Multi Wound Chart Details Patient Name: Date of Service: Jonathan [name], Jonathan [name] 01/24/2019 3:45 PM Medical Record Jonathan [name] Patient Account Number: [id number] Date of Birth/Sex: Treating RN: February 01, 1961 (58 y.o. [name] Primary Care Derriona Branscom: [employer] Other Clinician: Referring Edye Hainline: Treating Luceil Herrin/Extender:Robson, Gustavo Lah, Cynda Familia in Treatment: 2 Vital Signs Height(in): 63 Pulse(bpm): 100 Weight(lbs): 162 Blood Pressure(mmHg): 123/76 Body Mass Index(BMI): 29 Temperature(F): 98.9 Respiratory 18 Rate(breaths/min): Photos: [1:No Photos] [2:No Photos] [3:No Photos] Wound Location: [1:Right Toe Fourth - Medial Right Foot - Dorsal] [3:Right Lower Leg - Medial] Wounding Event: [1:Gradually Appeared] [2:Gradually Appeared] [3:Gradually Appeared] Primary Etiology: [1:Inflammatory] [2:Inflammatory] [3:Venous Leg Ulcer] Comorbid History: [1:Rheumatoid Arthritis, Osteoarthritis] [2:Rheumatoid Arthritis, Osteoarthritis] [3:Rheumatoid Arthritis, Osteoarthritis] Date Acquired: [1:11/24/2018] [2:11/24/2018] [3:11/24/2018] Weeks of Treatment: [1:2] [2:2] [3:2] Wound Status: [1:Open] [2:Open] [3:Open] Measurements L x W x D 0.5x0.4x0.1 [2:0.2x0.2x0.5] [3:0.6x2x0.1] (cm) Area (cm) : [1:0.157] [2:0.031] [3:0.942] Volume (cm) : [1:0.016] [2:0.016] [3:0.094] % Reduction in Area: [1:79.60%] [2:56.30%] [3:14.40%] % Reduction in  Volume: 79.20% [2:77.50%] [3:14.50%] Classification: [1:Full Thickness Without Exposed Support Structures Exposed Support Structures Exposed Support Structures] [2:Full Thickness Without] [3:Full Thickness Without] Exudate Amount: [1:Small] [2:Small] [3:Medium] Exudate  Type: [1:Serosanguineous] [2:Serosanguineous] [3:Serosanguineous] Exudate Color: [1:red, brown] [2:red, brown] [3:red, brown] Wound Margin: [1:Flat and Intact] [2:Well defined, not attached Flat and Intact] Granulation Amount: [1:Large (67-100%)] [2:Large (67-100%)] [3:Medium (34-66%)] Granulation Quality: [1:Red] [2:Pink] [3:Pink] Necrotic Amount: [1:None Present (0%)] [2:None Present (0%)] [3:Medium (34-66%)] Exposed Structures: [1:Fat Layer (Subcutaneous Fat Layer (Subcutaneous Fat Layer (Subcutaneous Tissue) Exposed: Yes Fascia: No Tendon: No Muscle: No Joint: No Bone: No] [2:Tissue) Exposed: Yes Fascia: No Tendon: No Muscle: No Joint: No Bone: No] [3:Tissue) Exposed: Yes  Fascia: No Tendon: No Muscle: No Joint: No Bone: No] Epithelialization: [1:None] [2:Large (67-100%)] [3:Small (1-33%)] Procedures Performed: N/A [2:Compression Therapy 4] [3:Compression Therapy 5] Photos: [1:No Photos] [2:No Photos] [3:No Photos] Wound Location: [1:Left Foot - Medial] [2:Left Foot - Dorsal] [3:Left Lower Leg - Anterior] Wounding Event: [1:Gradually Appeared] [2:Gradually Appeared] [3:Gradually Appeared] Primary Etiology: [1:Inflammatory] [2:Inflammatory] [3:Venous Leg Ulcer] Comorbid History: [1:Rheumatoid Arthritis, Osteoarthritis] [2:Rheumatoid Arthritis, Osteoarthritis] [3:Rheumatoid Arthritis, Osteoarthritis] Date Acquired: [1:11/24/2018] [2:11/24/2018] [3:11/24/2018] Weeks of Treatment: [1:2] [2:2] [3:2] Wound Status: [1:Open] [2:Open] [3:Open] Measurements L x W x D 1.5x3x0.1 [2:2.8x2.2x0.1] [3:1.2x0.8x0.1] (cm) Area (cm) : [1:3.534] [2:4.838] [3:0.754] Volume (cm) : [1:0.353] [2:0.484] [3:0.075] % Reduction in Area: [1:34.80%]  [2:49.30%] [3:-14.20%] % Reduction in Volume: 34.90% [2:49.30%] [3:-13.60%] Classification: [1:Full Thickness Without Exposed Support Structures Exposed Support Structures Exposed Support Structures] [2:Full Thickness Without] [3:Full Thickness Without] Exudate Amount: [1:Medium] [2:Medium] [3:Small] Exudate Type: [1:Serosanguineous] [2:Serosanguineous] [3:Serosanguineous] Exudate Color: [1:red, brown] [2:red, brown] [3:red, brown] Wound Margin: [1:Well defined, not attached] [2:Well defined, not attached] [3:Flat and Intact] Granulation Amount: [1:Large (67-100%)] [2:Medium (34-66%)] [3:Medium (34-66%)] Granulation Quality: [1:Red] [2:Red] [3:Pink] Necrotic Amount: [1:Small (1-33%)] [2:Medium (34-66%)] [3:Small (1-33%)] Exposed Structures: [1:Fat Layer (Subcutaneous Tissue) Exposed: Yes Fascia: No Tendon: No Muscle: No Joint: No Bone: No] [2:Fat Layer (Subcutaneous Tissue) Exposed: Yes Fascia: No Tendon: No Muscle: No Joint: No Bone: No] [3:Fat Layer (Subcutaneous Tissue) Exposed: Yes  Fascia: No Tendon: No Muscle: No Joint: No Bone: No] Epithelialization: [1:Small (1-33%)] [2:Small (1-33%)] [3:None] Procedures Performed: [1:Compression Therapy] [2:Compression Therapy 8] [3:Compression Therapy N/A] Photos: [1:No Photos] [2:N/A] [3:N/A] Wound Location: [1:Left Foot - Dorsal, Medial N/A] [3:N/A] Wounding Event: [1:Trauma] [2:N/A] [3:N/A] Primary Etiology: [1:Venous Leg Ulcer] [2:N/A] [3:N/A] Comorbid History: [1:Rheumatoid Arthritis, Osteoarthritis] [2:N/A] [3:N/A] Date Acquired: [1:12/10/2018] [2:N/A] [3:N/A] Weeks of Treatment: [1:2] [2:N/A] [3:N/A] Wound Status: [1:Open] [2:N/A] [3:N/A] Measurements L x W x D 0.5x0.2x0.1 [2:N/A] [3:N/A] (cm) Area (cm) : [1:0.079] [2:N/A] [3:N/A] Volume (cm) : [1:0.008] [2:N/A] [3:N/A] % Reduction in Area: [1:37.30%] [2:N/A] [3:N/A] % Reduction in Volume: 38.50% [2:N/A] [3:N/A] Classification: [1:Full Thickness Without Exposed Support Structures]  [2:N/A] [3:N/A] Exudate Amount: [1:Small] [2:N/A] [3:N/A] Exudate Type: [1:Serosanguineous] [2:N/A] [3:N/A] Exudate Color: [1:red, brown] [2:N/A] [3:N/A] Wound Margin: [1:Distinct, outline attached N/A] [3:N/A] Granulation Amount: [1:Large (67-100%)] [2:N/A] [3:N/A] Granulation Quality: [1:Pink] [2:N/A] [3:N/A] Necrotic Amount: [1:None Present (0%)] [2:N/A] [3:N/A] Exposed Structures: [1:Fat Layer (Subcutaneous N/A Tissue) Exposed: Yes Fascia: No Tendon: No Muscle: No Joint: No Bone: No] [3:N/A] Epithelialization: [1:Large (67-100%)] [2:N/A N/A] [3:N/A N/A] Treatment Notes Electronic Signature(s) Signed: 01/24/2019 6:13:48 PM By: Linton Ham MD Signed: 01/24/2019 6:31:45 PM By: Deon Pilling Entered By: Linton Ham on 01/24/2019 17:52:00 -------------------------------------------------------------------------------- Multi-Disciplinary Care Plan Details Patient Name: Date of Service: ALEXANDRA, MOSQUEDA 01/24/2019 3:45 PM Medical Record Jonathan [name] Patient Account Number: [id number] Date of Birth/Sex: Treating RN: 04-16-1961 (58 y.o. [name] Primary Care Keishana Klinger: [employer] Other Clinician: Referring Monterius Rolf: Treating Lindzy Rupert/Extender:Robson, Gustavo Lah, Cynda Familia in Treatment: 2 Active Inactive Nutrition Nursing Diagnoses: Potential for alteratiion  in Nutrition/Potential for imbalanced nutrition Goals: Patient/caregiver agrees to and verbalizes understanding of need to obtain nutritional consultation Date Initiated: 01/10/2019 Target Resolution Date: 02/22/2019 Goal Status: Active Interventions: Provide education on nutrition Treatment Activities: Patient referred to Primary Care Physician for further nutritional evaluation : 01/10/2019 Notes: Wound/Skin Impairment Nursing Diagnoses: Knowledge deficit related to ulceration/compromised skin integrity Goals: Patient/caregiver will verbalize understanding of skin care regimen Date Initiated:  01/10/2019 Target Resolution Date: 02/22/2019 Goal Status: Active Interventions: Assess patient/caregiver ability to obtain necessary supplies Assess patient/caregiver ability to perform ulcer/skin care regimen upon admission and as needed Provide education on ulcer and skin care Treatment Activities: Skin care regimen initiated : 01/10/2019 Topical wound management initiated : 01/10/2019 Notes: Electronic Signature(s) Signed: 01/24/2019 6:31:45 PM By: Deon Pilling Entered By: Deon Pilling on 01/24/2019 17:06:10 -------------------------------------------------------------------------------- Pain Assessment Details Patient Name: Date of Service: BIRUK, DIFALCO 01/24/2019 3:45 PM Medical Record Jonathan [name] Patient Account Number: [id number] Date of Birth/Sex: Treating RN: 1960/07/18 (58 y.o. [name] Primary Care Aslin Farinas: [employer] Other Clinician: Referring Yanci Bachtell: Treating Rashard Ryle/Extender:Robson, Gustavo Lah, Cynda Familia in Treatment: 2 Active Problems Location of Pain Severity and Description of Pain Patient Has Paino Yes Site Locations Pain Location: Pain in Ulcers With Dressing Change: Yes Duration of the Pain. Constant / Intermittento Intermittent Rate the pain. Current Pain Level: 4 Character of Pain Describe the Pain: Throbbing Pain Management and Medication Current Pain Management: Medication: Yes Cold Application: No Rest: No Massage: No Activity: No T.E.N.S.: No Heat Application: No Leg drop or elevation: No Is the Current Pain Management Adequate: Adequate How does your wound impact your activities of daily livingo Sleep: No Bathing: No Appetite: No Relationship With Others: No Bladder Continence: No Emotions: No Bowel Continence: No Work: No Toileting: No Drive: No Dressing: No Hobbies: No Electronic Signature(s) Signed: 01/28/2019 6:17:12 PM By: [name] RN, BSN Entered By: [name] on 01/24/2019  16:30:42 -------------------------------------------------------------------------------- Patient/Caregiver Education Details Patient Name: Date of Service: Jani Files 10/1/2020andnbsp3:45 PM Medical Record Jonathan [name] Patient Account Number: [id number] Date of Birth/Gender: Treating RN: [birth year] (58 y.o. [name] Primary Care Physician: [employer] Other Clinician: Referring Physician: Treating Physician/Extender:Robson, Gustavo Lah, Cynda Familia in Treatment: 2 Education Assessment Education Provided To: Patient Education Topics Provided Nutrition: Handouts: Nutrition Methods: Explain/Verbal Responses: Reinforcements needed Electronic Signature(s) Signed: 01/24/2019 6:31:45 PM By: Deon Pilling Entered By: Deon Pilling on 01/24/2019 17:06:21 -------------------------------------------------------------------------------- Wound Assessment Details Patient Name: Date of Service: Jonathan [name], Jonathan [name] 01/24/2019 3:45 PM Medical Record Jonathan [name] Patient Account Number: [id number] Date of Birth/Sex: Treating RN: 1961-01-19 (58 y.o. [name] Primary Care Kenadi Miltner: [employer] Other Clinician: Referring Librado Guandique: Treating Wendy Mikles/Extender:Robson, Gustavo Lah, Cynda Familia in Treatment: 2 Wound Status Wound Number: 1 Primary Etiology: Inflammatory Wound Location: Right Toe Fourth - Medial Wound Status: Open Wounding Event: Gradually Appeared Comorbid History: Rheumatoid Arthritis, Osteoarthritis Date Acquired: 11/24/2018 Weeks Of Treatment: 2 Clustered Wound: No Photos Wound Measurements Length: (cm) 0.5 % Reduct Width: (cm) 0.4 % Reduct Depth: (cm) 0.1 Epitheli Area: (cm) 0.157 Tunneli Volume: (cm) 0.016 Undermi Wound Description Full Thickness Without Exposed Support Foul Odo Classification: Structures Slough/F Wound Flat and Intact Margin: Exudate Small Amount: Exudate Serosanguineous Type: Exudate red,  brown Color: Wound Bed Granulation Amount: Large (67-100%) Granulation Quality: Red Fascia E Necrotic Amount: None Present (0%) Fat Laye Tendon E Muscle E Joint Ex Bone Exp r After Cleansing: No ibrino Yes Exposed Structure xposed: No r (Subcutaneous Tissue) Exposed: Yes xposed: No xposed: No posed: No osed: No ion in  Area: 79.6% ion in Volume: 79.2% alization: None ng: No ning: No Electronic Signature(s) Signed: 01/25/2019 4:13:18 PM By: Mikeal Hawthorne EMT/HBOT Signed: 01/28/2019 6:17:12 PM By: [name] RN, BSN Entered By: Mikeal Hawthorne on 01/25/2019 11:02:44 -------------------------------------------------------------------------------- Wound Assessment Details Patient Name: Date of Service: WYETT, FORCUCCI 01/24/2019 3:45 PM Medical Record Jonathan [name]:[id number] Patient Account Number: [id number] Date of Birth/Sex: Treating RN: 1960/08/11 (58 y.o. [name] Primary Care Monike Bragdon: [employer] Other Clinician: Referring Kentravious Lipford: Treating Ceylon Arenson/Extender:Robson, Gustavo Lah, Cynda Familia in Treatment: 2 Wound Status Wound Number: 2 Primary Etiology: Inflammatory Wound Location: Right Foot - Dorsal Wound Status: Open Wounding Event: Gradually Appeared Comorbid History: Rheumatoid Arthritis, Osteoarthritis Date Acquired: 11/24/2018 Weeks Of Treatment: 2 Clustered Wound: No Photos Wound Measurements Length: (cm) 0.2 % Reduct Width: (cm) 0.2 % Reduct Depth: (cm) 0.5 Epitheli Area: (cm) 0.031 Tunneli Volume: (cm) 0.016 Undermi Wound Description Classification: Full Thickness Without Exposed Support Foul Odo Structures Slough/F Wound Well defined, not attached Margin: Exudate Small Amount: Exudate Serosanguineous Type: Exudate red, brown Color: Wound Bed Granulation Amount: Large (67-100%) Granulation Quality: Pink Fascia E Necrotic Amount: None Present (0%) Fat Laye Tendon E Muscle E Joint Ex Bone Exp r After Cleansing: No ibrino  No Exposed Structure xposed: No r (Subcutaneous Tissue) Exposed: Yes xposed: No xposed: No posed: No osed: No ion in Area: 56.3% ion in Volume: 77.5% alization: Large (67-100%) ng: No ning: No Electronic Signature(s) Signed: 01/25/2019 4:13:18 PM By: Mikeal Hawthorne EMT/HBOT Signed: 01/28/2019 6:17:12 PM By: [name] RN, BSN Entered By: Mikeal Hawthorne on 01/25/2019 11:03:21 -------------------------------------------------------------------------------- Wound Assessment Details Patient Name: Date of Service: REGNALD, SZCZEPANSKI 01/24/2019 3:45 PM Medical Record Jonathan [name]:[id number] Patient Account Number: [id number] Date of Birth/Sex: Treating RN: 10/31/1960 (58 y.o. [name] Primary Care Timea Breed: [employer] Other Clinician: Referring Volney Reierson: Treating Muntaha Vermette/Extender:Robson, Gustavo Lah, Cynda Familia in Treatment: 2 Wound Status Wound Number: 3 Primary Etiology: Venous Leg Ulcer Wound Location: Right Lower Leg - Medial Wound Status: Open Wounding Event: Gradually Appeared Comorbid History: Rheumatoid Arthritis, Osteoarthritis Date Acquired: 11/24/2018 Weeks Of Treatment: 2 Clustered Wound: No Photos Wound Measurements Length: (cm) 0.6 % Reduct Width: (cm) 2 % Reduct Depth: (cm) 0.1 Epitheli Area: (cm) 0.942 Tunneli Volume: (cm) 0.094 Undermi Wound Description Classification: Full Thickness Without Exposed Support Foul Odo Structures Slough/F Wound Flat and Intact Margin: Exudate Medium Amount: Exudate Serosanguineous Type: Exudate red, brown Color: Wound Bed Granulation Amount: Medium (34-66%) Granulation Quality: Pink Fascia E Necrotic Amount: Medium (34-66%) Fat Laye Necrotic Quality: Adherent Slough Tendon E Muscle E Joint Ex Bone Exp Electronic Signature(s) Signed: 01/25/2019 4:13:18 PM By: Mikeal Hawthorne EMT/HBOT Signed: 01/28/2019 6:17:12 PM By: [name] RN, BSN Entered By: Mikeal Hawthorne on 10/02 r After Cleansing:  No ibrino Yes Exposed Structure xposed: No r (Subcutaneous Tissue) Exposed: Yes xposed: No xposed: No posed: No osed: No /2020 11:03:03 ion in Area: 14.4% ion in Volume: 14.5% alization: Small (1-33%) ng: No ning: No -------------------------------------------------------------------------------- Wound Assessment Details Patient Name: Date of Service: Jonathan [name], Jonathan [name] 01/24/2019 3:45 PM Medical Record [id number] Patient Account Number: [id number] Date of Birth/Sex: Treating RN: [birth year] (58 y.o. [name] Primary Care Dajuan Turnley: [employer] Other Clinician: Referring Sharee Sturdy: Treating Jefferson Fullam/Extender:Robson, Gustavo Lah, Cynda Familia in Treatment: 2 Wound Status Wound Number: 4 Primary Etiology: Inflammatory Wound Location: Left Foot - Medial Wound Status: Open Wounding Event: Gradually Appeared Comorbid History: Rheumatoid Arthritis, Osteoarthritis Date Acquired: 11/24/2018 Weeks Of Treatment: 2 Clustered Wound: No Photos Wound Measurements Length: (cm) 1.5 % Reduct Width: (cm)  3 % Reduct Depth: (cm) 0.1 Epitheli Area: (cm) 3.534 Tunneli Volume: (cm) 0.353 Undermi Wound Description Classification: Full Thickness Without Exposed Support Foul Odo Structures Slough/F Wound Well defined, not attached Margin: Exudate Medium Amount: Exudate Serosanguineous Type: Exudate red, brown Color: Wound Bed Granulation Amount: Large (67-100%) Granulation Quality: Red Fascia E Necrotic Amount: Small (1-33%) Fat Laye Necrotic Quality: Adherent Slough Tendon E Muscle E Joint Ex Bone Exp r After Cleansing: No ibrino Yes Exposed Structure xposed: No r (Subcutaneous Tissue) Exposed: Yes xposed: No xposed: No posed: No osed: No ion in Area: 34.8% ion in Volume: 34.9% alization: Small (1-33%) ng: No ning: No Electronic Signature(s) Signed: 01/25/2019 4:13:18 PM By: Mikeal Hawthorne EMT/HBOT Signed: 01/28/2019 6:17:12 PM By: [name] RN,  BSN Entered By: Mikeal Hawthorne on 01/25/2019 11:09:11 -------------------------------------------------------------------------------- Wound Assessment Details Patient Name: Date of Service: Jonathan [name], Jonathan [name] 01/24/2019 3:45 PM Medical Record [id number] Patient Account Number: [id number] Date of Birth/Sex: Treating RN: May 02, 1960 (58 y.o. [name] Primary Care Cotton Beckley: [employer] Other Clinician: Referring Rosamond Andress: Treating Torri Langston/Extender:Robson, Gustavo Lah, Cynda Familia in Treatment: 2 Wound Status Wound Number: 5 Primary Etiology: Inflammatory Wound Location: Left Foot - Dorsal Wound Status: Open Wounding Event: Gradually Appeared Comorbid History: Rheumatoid Arthritis, Osteoarthritis Date Acquired: 11/24/2018 Weeks Of Treatment: 2 Clustered Wound: No Photos Wound Measurements Length: (cm) 2.8 % Reduct Width: (cm) 2.2 % Reduct Depth: (cm) 0.1 Epitheli Area: (cm) 4.838 Tunneli Volume: (cm) 0.484 Undermi Wound Description Classification: Full Thickness Without Exposed Support Foul Odo Classification: Structures Slough/F Wound Well defined, not attached Margin: Exudate Medium Amount: Exudate Serosanguineous Type: Exudate red, brown Color: Wound Bed Granulation Amount: Medium (34-66%) Granulation Quality: Red Fascia Ex Necrotic Amount: Medium (34-66%) Fat Layer Necrotic Quality: Adherent Slough Tendon Ex Muscle Ex Joint Exp Bone Expo r After Cleansing: No ibrino Yes Exposed Structure posed: No (Subcutaneous Tissue) Exposed: Yes posed: No posed: No osed: No sed: No ion in Area: 49.3% ion in Volume: 49.3% alization: Small (1-33%) ng: No ning: No Electronic Signature(s) Signed: 01/25/2019 4:13:18 PM By: Mikeal Hawthorne EMT/HBOT Signed: 01/28/2019 6:17:12 PM By: [name] RN, BSN Entered By: Mikeal Hawthorne on 01/25/2019 11:08:31 -------------------------------------------------------------------------------- Wound Assessment  Details Patient Name: Date of Service: Jonathan [name], Jonathan [name] 01/24/2019 3:45 PM Medical Record [id number] Patient Account Number: [id number] Date of Birth/Sex: Treating RN: Jun 01, 1960 (58 y.o. [name] Primary Care Jasmond River: [employer] Other Clinician: Referring Julieann Drummonds: Treating Emunah Texidor/Extender:Robson, Gustavo Lah, Cynda Familia in Treatment: 2 Wound Status Wound Number: 6 Primary Etiology: Venous Leg Ulcer Wound Location: Left Lower Leg - Anterior Wound Status: Open Wounding Event: Gradually Appeared Comorbid History: Rheumatoid Arthritis, Osteoarthritis Date Acquired: 11/24/2018 Weeks Of Treatment: 2 Clustered Wound: No Photos Wound Measurements Length: (cm) 1.2 % Reduct Width: (cm) 0.8 % Reduct Depth: (cm) 0.1 Epitheli Area: (cm) 0.754 Tunneli Volume: (cm) 0.075 Undermi Wound Description Classification: Full Thickness Without Exposed Support Foul Odo Structures Slough/F Wound Flat and Intact Margin: Exudate Small Amount: Exudate Serosanguineous Type: Exudate red, brown Color: Wound Bed Granulation Amount: Medium (34-66%) Granulation Quality: Pink Fascia E Necrotic Amount: Small (1-33%) Fat Laye Necrotic Quality: Adherent Slough Tendon E Muscle E Joint Ex Bone Exp r After Cleansing: No ibrino Yes Exposed Structure xposed: No r (Subcutaneous Tissue) Exposed: Yes xposed: No xposed: No posed: No osed: No ion in Area: -14.2% ion in Volume: -13.6% alization: None ng: No ning: No Electronic Signature(s) Signed: 01/25/2019 4:13:18 PM By: Mikeal Hawthorne EMT/HBOT Signed: 01/28/2019 6:17:12 PM By: [name] RN, BSN Entered By: Mikeal Hawthorne  on 01/25/2019 11:08:50 -------------------------------------------------------------------------------- Wound Assessment Details Patient Name: Date of Service: Jonathan [name], KIMMEY 01/24/2019 3:45 PM Medical Record [id number] Patient Account Number: [id number] Date of Birth/Sex: Treating  RN: 01/31/1961 (58 y.o. [name] Primary Care Kaytlynne Neace: [employer] Other Clinician: Referring Suede Greenawalt: Treating Avannah Decker/Extender:Robson, Gustavo Lah, Cynda Familia in Treatment: 2 Wound Status Wound Number: 8 Primary Etiology: Venous Leg Ulcer Wound Location: Left Foot - Dorsal, Medial Wound Status: Open Wounding Event: Trauma Comorbid History: Rheumatoid Arthritis, Osteoarthritis Date Acquired: 12/10/2018 Weeks Of Treatment: 2 Clustered Wound: No Photos Wound Measurements Length: (cm) 0.5 % Reduct Width: (cm) 0.2 % Reduct Depth: (cm) 0.1 Epitheli Area: (cm) 0.079 Tunneli Volume: (cm) 0.008 Undermi Wound Description Classification: Full Thickness Without Exposed Support Foul Odo Structures Slough/F Wound Distinct, outline attached Margin: Exudate Small Amount: Exudate Serosanguineous Type: Exudate red, brown Color: Wound Bed Granulation Amount: Large (67-100%) Granulation Quality: Pink Fascia E Necrotic Amount: None Present (0%) Fat Laye Tendon E Muscle E Joint Ex Bone Exp r After Cleansing: No ibrino Yes Exposed Structure xposed: No r (Subcutaneous Tissue) Exposed: Yes xposed: No xposed: No posed: No osed: No ion in Area: 37.3% ion in Volume: 38.5% alization: Large (67-100%) ng: No ning: No Electronic Signature(s) Signed: 01/25/2019 4:13:18 PM By: Mikeal Hawthorne EMT/HBOT Signed: 01/28/2019 6:17:12 PM By: [name] RN, BSN Entered By: Mikeal Hawthorne on 01/25/2019 11:03:40 -------------------------------------------------------------------------------- Vitals Details Patient Name: Date of Service: KHALID, BERNARDY 01/24/2019 3:45 PM Medical Record Jonathan [name] Patient Account Number: [id number] Date of Birth/Sex: Treating RN: [birth year] (58 y.o. [name] Primary Care Jailyne Chieffo: [employer] Other Clinician: Referring Andreana Klingerman: Treating Mathews Stuhr/Extender:Robson, Gustavo Lah, Cynda Familia in Treatment: 2 Vital  Signs Time Taken: 16:25 Temperature (F): 98.9 Height (in): 63 Pulse (bpm): 100 Weight (lbs): 162 Respiratory Rate (breaths/min): 18 Body Mass Index (BMI): 28.7 Blood Pressure (mmHg): 123/76 Reference Range: 80 - 120 mg / dl Electronic Signature(s) Signed: 01/28/2019 6:17:12 PM By: [name] RN, BSN Entered By: [name] on 01/24/2019 16:25:29

## 2019-02-06 NOTE — Progress Notes (Signed)
   Subjective:  Patient presents today status post right forefoot reconstruction surgery. DOS: 10/18/2018.  Patient continues to have home health dressing changes to the bilateral ulcerations.  Patient states that his feet are healing well.  He has very minimal pain.  No drainage to the wound and he has been treated at the [facility] wound care center.  He denies fever nausea vomiting or chills.  Past Medical History:  Diagnosis Date  . Anemia    H/o using iron in the past   . Arthritis    rheumatoid  . Chronic back pain   . Hyperlipidemia   . Joint pain   . Joint swelling   . Rheumatoid arthritis(714.0)       Objective/Physical Exam Neurovascular status intact.  Significantly improved ulceration to the fourth digit of the surgical foot right.  The focal area of dehiscence overlying the third toe dorsal incision has healed.  Complete reepithelialization has occurred. Vascular status appears to be intact.  I am able to palpate the posterior tibial and dorsalis pedis arteries to the surgical extremity.  The skin is cool to touch however.  Multiple ulcerations noted to the left lower extremity, nonsurgical extremity.  Most concerning ulcer is to the dorsal aspect of the left forefoot measuring approximately 2.0 x 2.0 x 0.2 cm.  Somewhat unchanged since last visit to the noted ulceration there is a moderate amount of fibrotic tissue noted.  Mild malodor noted.  There is no exposed bone muscle tendon ligament or joint.  Moderate amount of serosanguineous drainage noted.  Periwound integrity is intact.  Assessment: 1. s/p right forefoot reconstruction surgery. DOS: 10/18/2018 2.  Multiple ulcerations left lower extremity   Plan of Care:  1. Patient was evaluated. Aquacel and DSD applied.  2.  Continue home health dressing changes every other day using Aquacel and DSD.  3.  Continue follow-up with the [facility] wound care center for the left foot ulcerations.  4.  Continue wearing  postoperative shoes bilateral. 5.  Return to clinic in 8 weeks   Edrick Kins, DPM [facility]  Dr. Edrick Kins, [address]                                        [place], [place] [address]                Office [phone]  Fax [phone]

## 2019-02-07 ENCOUNTER — Encounter (HOSPITAL_BASED_OUTPATIENT_CLINIC_OR_DEPARTMENT_OTHER): Payer: Medicare Other | Admitting: Internal Medicine

## 2019-02-07 ENCOUNTER — Other Ambulatory Visit: Payer: Self-pay

## 2019-02-07 DIAGNOSIS — I87331 Chronic venous hypertension (idiopathic) with ulcer and inflammation of right lower extremity: Secondary | ICD-10-CM | POA: Diagnosis not present

## 2019-02-08 DIAGNOSIS — D892 Hypergammaglobulinemia, unspecified: Secondary | ICD-10-CM | POA: Insufficient documentation

## 2019-02-08 DIAGNOSIS — R7302 Impaired glucose tolerance (oral): Secondary | ICD-10-CM | POA: Insufficient documentation

## 2019-02-08 DIAGNOSIS — E538 Deficiency of other specified B group vitamins: Secondary | ICD-10-CM | POA: Insufficient documentation

## 2019-02-08 NOTE — Progress Notes (Addendum)
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 Margin: Exudate Small Amount: Exudate Serosanguineous Type: Exudate red, brown Color: Wound Bed Granulation Amount: Medium (34-66%) Granulation Quality: Pink Necrotic Amount: Small (1-33%) Necrotic Quality: Adherent Slough Foul Odor After Cleansing: No Slough/Fibrino Yes Exposed Structure Fascia Exposed: No Fat Layer (Subcutaneous Tissue) Exposed: Yes Tendon Exposed: No Muscle Exposed: No Joint Exposed: No Bone Exposed: No ion in Area: 76.2% ion in Volume: 75.8% alization: None ng: No ning: No Treatment Notes Wound #6 (Left, Anterior Lower Leg) 1. Cleanse With Wound Cleanser Soap and water 2. Periwound Care TCA Cream 3. Primary Dressing Applied Collegen AG 4. Secondary  Dressing ABD Pad Dry Gauze 6. Support Layer Applied 3 layer compression wrap Electronic Signature(s) Signed: 02/12/2019 1:32:19 PM By: Mikeal Hawthorne EMT/HBOT Signed: 02/12/2019 6:16:57 PM By: Kela Millin Previous Signature: 02/07/2019 6:42:53 PM Version By: Kela Millin Entered By: Mikeal Hawthorne on 02/11/2019 08:57:35 -------------------------------------------------------------------------------- Vitals Details Patient Name: Date of Service: DEMOSTHENES, HULME 02/07/2019 12:45 PM Medical Record [id number] Patient Account Number: [id number] Date of Birth/Sex: Treating RN: Jan 04, 1961 (58 y.o. Jonathan [name] Primary Care Yosiah Jasmin: Sinclair Ship Other Clinician: Referring Lorelai Huyser: Treating Otisha Spickler/Extender:Robson, Jonathan [name], Jonathan [name] in Treatment: 4 Vital Signs Time Taken: 13:00 Temperature (F): 99.1 Height (in): 63 Pulse (bpm): 91 Weight (lbs): 162 Respiratory Rate (breaths/min): 16 Body Mass Index (BMI): 28.7 Blood Pressure (mmHg): 125/66 Reference Range: 80 - 120 mg / dl Electronic Signature(s) Signed: 02/07/2019 6:42:53 PM By: Kela Millin Entered By: Kela Millin on 02/07/2019 13:01:51

## 2019-02-08 NOTE — Progress Notes (Signed)
Jonathan, [name] ([id number]) Visit Report for 02/07/2019 HPI Details Patient Name: Date of Service: [name], [name] 02/07/2019 12:45 PM Medical Record [id number] Patient Account Number: [id number] Date of Birth/Sex: Treating RN: [birth year] (58 y.o. Jonathan [name] Primary Care Provider: Sinclair Ship Other Clinician: Referring Provider: Treating Provider/Extender:Jesiah Yerby, Gustavo Lah, Cynda Familia in Treatment: 4 History of Present Illness HPI Description: ADMISSION 01/10/2019 This is a 58 year old man who has rheumatoid arthritis on Remicade. He is not a diabetic. He had forefoot surgery by Jonathan [name] on June 25 for correction of hammertoes. At some point in the postop follow-up he presented with multiple wounds on the left foot. I do not really have a good description at this point but I will try to look through Jonathan [name] health link. He had arterial studies on 11/28/2018 that were within normal limits. Seen by podiatry on 8/24 with multiple left foot and lower extremity ulcers. He was treated with Bactrim and Cipro. He was hospitalized from 8/28 through 8/30 with wounds on the left anterior foot. An MRI was negative at that point the wounds were on the left anterior and left medial foot. Felt to have cellulitis. He saw his primary doctor in follow-up on 9/3 and felt to have pressure ulcers on the foot. The patient states that he developed these after some acute swelling last month. This may have been a result of infection. He comes in today with multiple wounds on his toes dorsal feet and distal lower extremities anteriorly. The exact cause of this is not completely clear. He does not have a prior wound history. He has multiple lower extremity wounds including the left medial foot, left dorsal foot, left second toe, left anterior mid tibia, right dorsal foot, right fourth toe medially and the right medial lower leg. The areas on the left medial foot and left dorsal foot are  large superficial wounds most of the rest of these are small punched out looking areas. He does not complain of a lot of pain Past medical history; hypertension, rheumatoid arthritis on Remicade infusions, hyperlipidemia right forefoot surgery on 6/25, he is on Xarelto at this point for reasons that are not totally clear. Arterial studies on 11/28/2018 showed an ABI of 0.98 on the right 1.01 on the left. TBI's of 0.79 on the right 0.75 on the left and triphasic waveforms bilaterally 9/24; wounds on his bilateral lower feet and lower legs. We have been using silver collagen on the wounds. 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 Follows with Dr. Raliegh [name]" rheumatology at [name]. 10/1; patient with wounds on his bilateral lower feet and lower legs we have been using silver collagen. 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 He does not have macrovascular disease by previous noninvasive studies in August. 10/8; patient with wounds on his bilateral lower feet and legs. Small punched out areas. I have no doubt he has chronic venous disease however these wounds do not look like classic venous ulcers. He is generally been making good improvements. 10/15; patients with small wounds on his bilateral lower legs. He has nothing left on the right 3 wounds on the left are making good progress. On the left we have the left medial foot, left anterior tibial area and the larger area on the dorsal left foot in close proximity to the second  and third toes. All of these look a lot better. The patient has clear evidence of chronic venous insufficiency although these wounds did not look like venous insufficiency wounds Electronic Signature(s) Signed: 02/07/2019 5:35:18 PM By: Linton Ham MD Entered By: Linton Ham on 02/07/2019 13:52:48 -------------------------------------------------------------------------------- Physical Exam Details Patient Name: Date of Service: Jonathan, [name] 02/07/2019 12:45 PM Medical Record [id number] Patient Account Number: [id number] Date of Birth/Sex: Treating RN: [birth year] (58 y.o. Jonathan [name] Primary Care Provider: Sinclair Ship Other Clinician: Referring Provider: Treating Provider/Extender:Octavious Zidek, Gustavo Lah, Cynda Familia in Treatment: 4 Constitutional Sitting or standing Blood Pressure is within target range for patient.. Pulse regular and within target range for patient.Marland Kitchen Respirations regular, non-labored and within target range.. Temperature is normal and within the target range for the patient.Marland Kitchen Appears in no distress. Eyes Conjunctivae clear. No discharge.no icterus. Respiratory work of breathing is normal. Cardiovascular Pedal pulses palpable and strong bilaterally.. Integumentary (Hair, Skin) Skin changes of chronic venous insufficiency in the distal lower leg and dorsal feet.Marland Kitchen Psychiatric appears at normal baseline. Notes Wound exam; the patient only has 3 remaining wounds and all of these look better. There is nothing on the right. He has an area on the left medial foot, left anterior tibia and the dorsal left foot. All of these look considerably better. Electronic Signature(s) Signed: 02/07/2019 5:35:18 PM By: Linton Ham MD Entered By: Linton Ham on 02/07/2019 13:53:53 -------------------------------------------------------------------------------- Physician Orders Details Patient Name: Date of Service: HASTINGS, BUERKLE 02/07/2019 12:45 PM Medical Record [id number] Patient Account Number: [id number] Date of Birth/Sex: Treating RN: 06/20/1960 (58 y.o. Jonathan [name] Primary Care Provider: Sinclair Ship Other Clinician: Referring Provider: Treating  Provider/Extender:Fernande Treiber, Gustavo Lah, Cynda Familia in Treatment: 4 Verbal / Phone Orders: No Diagnosis Coding ICD-10 Coding Code Description L97.521 Non-pressure chronic ulcer of other part of left foot limited to breakdown of skin L97.821 Non-pressure chronic ulcer of other part of left lower leg limited to breakdown of skin L97.511 Non-pressure chronic ulcer of other part of right foot limited to breakdown of skin L97.812 Non-pressure chronic ulcer of other part of right lower leg with fat layer exposed I87.331 Chronic venous hypertension (idiopathic) with ulcer and inflammation of right lower extremity M05.80 Other rheumatoid arthritis with rheumatoid factor of unspecified site Follow-up Appointments Return Appointment in 1 week. Dressing Change Frequency Wound #4 Left,Medial Foot Change dressing three times week. - twice a week by home health and once a week by wound [name]. Wound #5 Left,Dorsal Foot Change dressing three times week. - twice a week by home health and once a week by wound [name]. Wound #6 Left,Anterior Lower Leg Change dressing three times week. - twice a week by home health and once a week by wound [name]. Skin Barriers/Peri-Wound Care TCA Cream or Ointment - liberally to both legs, feet, toes, and all wounds in clinic and at home by home health. Wound Cleansing Wound #4 Left,Medial Foot Clean wound with Wound Cleanser - or normal saline Wound #5 Left,Dorsal Foot Clean wound with Wound Cleanser - or normal saline Wound #6 Left,Anterior Lower Leg Clean wound with Wound Cleanser - or normal saline Primary Wound Dressing Wound #4 Left,Medial Foot Silver Collagen - moisten with saline or hydrogel. Wound #5 Left,Dorsal Foot Silver Collagen - moisten with saline or hydrogel. Wound #6 Left,Anterior Lower Leg Silver Collagen - moisten with saline or hydrogel. Secondary Dressing Wound #4 Left,Medial Foot Dry Gauze ABD pad Wound #5 Left,Dorsal Foot Dry  Gauze ABD pad Wound #6 Left,Anterior Lower Leg Dry  Gauze ABD pad Edema Control 3 Layer Compression System - Left Lower Extremity Patient to wear own compression stockings - right leg daily Avoid standing for long periods of time Elevate legs to the level of the heart or above for 30 minutes daily and/or when sitting, a frequency of: - throughout the day. [facility] for [facility]. - [facility]. Electronic Signature(s) Signed: 02/07/2019 5:35:18 PM By: Linton Ham MD Signed: 02/08/2019 6:00:55 PM By: [name] RN, BSN Entered By: [name] on 02/07/2019 13:50:57 -------------------------------------------------------------------------------- Problem List Details Patient Name: Date of Service: HARDIN, DAR 02/07/2019 12:45 PM Medical Record [id number] Patient Account Number: [id number] Date of Birth/Sex: Treating RN: March 30, 1961 (58 y.o. Jonathan [name] Primary Care Provider: Sinclair Ship Other Clinician: Referring Provider: Treating Provider/Extender:Marcelle Hepner, Gustavo Lah, Cynda Familia in Treatment: 4 Active Problems ICD-10 Evaluated Encounter Code Description Active Date Today Diagnosis L97.521 Non-pressure chronic ulcer of other part of left foot 01/10/2019 No Yes limited to breakdown of skin L97.821 Non-pressure chronic ulcer of other part of left lower 01/10/2019 No Yes leg limited to breakdown of skin I87.331 Chronic venous hypertension (idiopathic) with ulcer 01/10/2019 No Yes and inflammation of right lower extremity M05.80 Other rheumatoid arthritis with rheumatoid factor of 01/10/2019 No Yes unspecified site Inactive Problems ICD-10 Code Description Active Date Inactive Date L97.511 Non-pressure chronic ulcer of other part of right foot limited to 01/10/2019 01/10/2019 breakdown of skin L97.812 Non-pressure chronic ulcer of other part of right lower leg with 01/10/2019 01/10/2019 fat layer  exposed Resolved Problems Electronic Signature(s) Signed: 02/07/2019 5:35:18 PM By: Linton Ham MD Entered By: Linton Ham on 02/07/2019 13:51:23 -------------------------------------------------------------------------------- Progress Note Details Patient Name: Date of Service: GLYNDON, RAILE 02/07/2019 12:45 PM Medical Record [id number] Patient Account Number: [id number] Date of Birth/Sex: Treating RN: 06-25-1960 (58 y.o. Jonathan [name] Primary Care Provider: Sinclair Ship Other Clinician: Referring Provider: Treating Provider/Extender:Dyana Magner, Gustavo Lah, Cynda Familia in Treatment: 4 Subjective History of Present Illness (HPI) ADMISSION 01/10/2019 This is a 58 year old man who has rheumatoid arthritis on Remicade. He is not a diabetic. He had forefoot surgery by Jonathan [name] on June 25 for correction of hammertoes. At some point in the postop follow-up he presented with multiple wounds on the left foot. I do not really have a good description at this point but I will try to look through [facility] health link. He had arterial studies on 11/28/2018 that were within normal limits. Seen by podiatry on 8/24 with multiple left foot and lower extremity ulcers. He was treated with Bactrim and Cipro. He was hospitalized from 8/28 through 8/30 with wounds on the left anterior foot. An MRI was negative at that point the wounds were on the left anterior and left medial foot. Felt to have cellulitis. He saw his primary doctor in follow-up on 9/3 and felt to have pressure ulcers on the foot. The patient states that he developed these after some acute swelling last month. This may have been a result of infection. He comes in today with multiple wounds on his toes dorsal feet and distal lower extremities anteriorly. The exact cause of this is not completely clear. He does not have a prior wound history. He has multiple lower extremity wounds including the left medial foot, left  dorsal foot, left second toe, left anterior mid tibia, right dorsal foot, right fourth toe medially and the right medial lower leg. The areas on the left medial foot and left dorsal foot are large superficial wounds most  of the rest of these are small punched out looking areas. He does not complain of a lot of pain Past medical history; hypertension, rheumatoid arthritis on Remicade infusions, hyperlipidemia right forefoot surgery on 6/25, he is on Xarelto at this point for reasons that are not totally clear. Arterial studies on 11/28/2018 showed an ABI of 0.98 on the right 1.01 on the left. TBI's of 0.79 on the right 0.75 on the left and triphasic waveforms bilaterally 9/24; wounds on his bilateral lower feet and lower legs. We have been using silver collagen on the wounds. 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 Follows with Dr. Raliegh [name]" rheumatology at [facility]. 10/1; patient with wounds on his bilateral lower feet and lower legs we have been using silver collagen. 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 He does not have macrovascular disease by previous noninvasive studies in August. 10/8; patient with wounds on his bilateral lower feet and legs. Small punched out areas. I have no doubt he has chronic venous disease however these wounds do not look like classic venous ulcers. He is generally been making good improvements. 10/15; patients with small wounds on his bilateral lower legs. He has nothing left on the right 3 wounds on the left are making good progress. On the left we have the left medial foot, left anterior tibial area and the larger area on the dorsal left foot in close proximity to the second and third toes. All of these look a lot better. The  patient has clear evidence of chronic venous insufficiency although these wounds did not look like venous insufficiency wounds Objective Constitutional Sitting or standing Blood Pressure is within target range for patient.. Pulse regular and within target range for patient.Marland Kitchen Respirations regular, non-labored and within target range.. Temperature is normal and within the target range for the patient.Marland Kitchen Appears in no distress. Vitals Time Taken: 1:00 PM, Height: 63 in, Weight: 162 lbs, BMI: 28.7, Temperature: 99.1 F, Pulse: 91 bpm, Respiratory Rate: 16 breaths/min, Blood Pressure: 125/66 mmHg. Eyes Conjunctivae clear. No discharge.no icterus. Respiratory work of breathing is normal. Cardiovascular Pedal pulses palpable and strong bilaterally.Marland Kitchen Psychiatric appears at normal baseline. General Notes: Wound exam; the patient only has 3 remaining wounds and all of these look better. There is nothing on the right. He has an area on the left medial foot, left anterior tibia and the dorsal left foot. All of these look considerably better. Integumentary (Hair, Skin) Skin changes of chronic venous insufficiency in the distal lower leg and dorsal feet.. Wound #1 status is Open. Original cause of wound was Gradually Appeared. The wound is located on the Right,Medial Toe Fourth. The wound measures 0cm length x 0cm width x 0cm depth; 0cm^2 area and 0cm^3 volume. There is no tunneling or undermining noted. There is a none present amount of drainage noted. The wound margin is flat and intact. There is no granulation within the wound bed. There is no necrotic tissue within the wound bed. Wound #2 status is Open. Original cause of wound was Gradually Appeared. The wound is located on the Right,Dorsal Foot. The wound measures 0cm length x 0cm width x 0cm depth; 0cm^2 area and 0cm^3 volume. There is no tunneling or undermining noted. There is a none present amount of drainage noted. The wound margin is well  defined and not attached to the wound base. There is no granulation within the wound bed. There is no necrotic tissue within the wound bed. Wound #3 status is Open. Original cause of wound was Gradually Appeared. The wound is located on the Right,Medial Lower Leg. The wound measures 0cm length x 0cm width x 0cm depth; 0cm^2 area and 0cm^3 volume. There is no tunneling or undermining noted. There is a none present amount of drainage noted. The wound margin is flat and intact. There is no granulation within the wound bed. There is no necrotic tissue within the wound bed. Wound #4 status is Open. Original cause of wound was Gradually Appeared. The wound is located on the Left,Medial Foot. The wound measures 1.5cm length x 2.2cm width x 0.1cm depth; 2.592cm^2 area and 0.259cm^3 volume. There is Fat Layer (Subcutaneous Tissue) Exposed exposed. There is no tunneling or undermining noted. There is a medium amount of serosanguineous drainage noted. The wound margin is well defined and not attached to the wound base. There is large (67-100%) red granulation within the wound bed. There is a small (1-33%) amount of necrotic tissue within the wound bed including Eschar and Adherent Slough. Wound #5 status is Open. Original cause of wound was Gradually Appeared. The wound is located on the Left,Dorsal Foot. The wound measures 2.1cm length x 1.6cm width x 0.1cm depth; 2.639cm^2 area and 0.264cm^3 volume. There is Fat Layer (Subcutaneous Tissue) Exposed exposed. There is no tunneling or undermining noted. There is a medium amount of serosanguineous drainage noted. The wound margin is well defined and not attached to the wound base. There is medium (34-66%) red granulation within the wound bed. There is a medium (34-66%) amount of necrotic tissue within the wound bed including Adherent Slough. Wound #6 status is Open. Original cause of wound was Gradually Appeared. The wound is located on the Left,Anterior Lower  Leg. The wound measures 0.5cm length x 0.4cm width x 0.1cm depth; 0.157cm^2 area and 0.016cm^3 volume. There is Fat Layer (Subcutaneous Tissue) Exposed exposed. There is no tunneling or undermining noted. There is a small amount of serosanguineous drainage noted. The wound margin is flat and intact. There is medium (34-66%) pink granulation within the wound bed. There is a small (1-33%) amount of necrotic tissue within the wound bed including Adherent Slough. 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 There was a Three Layer Compression Therapy Procedure by [name], RN. Post procedure Diagnosis Wound #4: Same as Pre-Procedure Wound #5 Pre-procedure diagnosis of Wound #5 is an Inflammatory located on the Left,Dorsal Foot . There was a Three Layer Compression Therapy Procedure by [name], RN. Post procedure Diagnosis Wound #5: Same as Pre-Procedure Wound #6 Pre-procedure diagnosis of Wound #6 is a Venous Leg Ulcer located on the Left,Anterior Lower Leg . There was a Three Layer Compression Therapy Procedure by [name], RN. Post procedure Diagnosis Wound #6: Same as Pre-Procedure Plan Follow-up Appointments: Return Appointment in 1 week. Dressing Change Frequency: Wound #4 Left,Medial Foot: Change dressing three times week. - twice a week by home health and once a week by wound [name]. Wound #5 Left,Dorsal  Foot: Change dressing three times week. - twice a week by home health and once a week by wound [name]. Wound #6 Left,Anterior Lower  Leg: Change dressing three times week. - twice a week by home health and once a week by wound [name]. Skin Barriers/Peri-Wound Care: TCA Cream or Ointment - liberally to both legs, feet, toes, and all wounds in clinic and at home by home health. Wound Cleansing: Wound #4 Left,Medial Foot: Clean wound with Wound Cleanser - or normal saline Wound #5 Left,Dorsal Foot: Clean wound with Wound Cleanser - or normal saline Wound #6 Left,Anterior Lower Leg: Clean wound with Wound Cleanser - or normal saline Primary Wound Dressing: Wound #4 Left,Medial Foot: Silver Collagen - moisten with saline or hydrogel. Wound #5 Left,Dorsal Foot: Silver Collagen - moisten with saline or hydrogel. Wound #6 Left,Anterior Lower Leg: Silver Collagen - moisten with saline or hydrogel. Secondary Dressing: Wound #4 Left,Medial Foot: Dry Gauze ABD pad Wound #5 Left,Dorsal Foot: Dry Gauze ABD pad Wound #6 Left,Anterior Lower Leg: Dry Gauze ABD pad Edema Control: 3 Layer Compression System - Left Lower Extremity Patient to wear own compression stockings - right leg daily Avoid standing for long periods of time Elevate legs to the level of the heart or above for 30 minutes daily and/or when sitting, a frequency of: - throughout the day. Home Health: [facility] for [facility]. - [facility]. 1. Still using silver collagen to the remaining wounds. 2. The question is come up about whether he will need stockings when he leaves the clinic. Given the rheumatoid in his hands I would not think he could put over the toe stockings on. I also had some questions about whether this was rheumatoid vasculitis initially Electronic Signature(s) Signed: 02/07/2019 5:35:18 PM By: Linton Ham MD Entered By: Linton Ham on 02/07/2019 13:54:50 -------------------------------------------------------------------------------- SuperBill Details Patient Name: Date of Service: AYEDIN, WINFREE 02/07/2019 Medical Record [id number] Patient Account Number: [id number] Date of Birth/Sex: Treating RN: [birth year] (58 y.o. Jonathan [name] Primary Care Provider: Sinclair Ship Other Clinician: Referring Provider: Treating Provider/Extender:Jaki Steptoe, Gustavo Lah, Cynda Familia in Treatment: 4 Diagnosis Coding ICD-10 Codes Code Description [phone] Non-pressure chronic ulcer of other part of left foot limited to breakdown of skin L97.821 Non-pressure chronic ulcer of other part of left lower leg limited to breakdown of skin I87.331 Chronic venous hypertension (idiopathic) with ulcer and inflammation of right lower extremity M05.80 Other rheumatoid arthritis with rheumatoid factor of unspecified site Facility Procedures The patient participates with Medicare or their insurance follows the Medicare Facility Guidelines: CPT4 Code Description Modifier Quantity [id number] (Facility Use Only) [phone] - [place] [facility] 1 Physician Procedures CPT4 Code Description: [id number] - WC PHYS LEVEL 3 - EST PT ICD-10 Diagnosis Description L97.521 Non-pressure chronic ulcer of other part of left foot limi L97.821 Non-pressure chronic ulcer of other part of left lower leg skin I87.331 Chronic venous  hypertension (idiopathic) with ulcer and in lower extremity M05.80 Other rheumatoid arthritis with rheumatoid factor of unspe Modifier: ted to breakdown limited to brea flammation of ri cified site Quantity: 1 of skin kdown of ght Electronic Signature(s) Signed: 02/08/2019 5:33:39 PM By: Linton Ham MD Signed: 02/08/2019 6:00:55 PM By: [name] RN, BSN Previous Signature: 02/07/2019 5:35:18 PM Version By: Linton Ham MD Entered By: [name] on 02/07/2019 18:21:03

## 2019-02-14 ENCOUNTER — Encounter (HOSPITAL_BASED_OUTPATIENT_CLINIC_OR_DEPARTMENT_OTHER): Payer: Medicare Other | Admitting: Internal Medicine

## 2019-02-14 ENCOUNTER — Other Ambulatory Visit: Payer: Self-pay

## 2019-02-14 ENCOUNTER — Telehealth: Payer: Self-pay | Admitting: Podiatry

## 2019-02-14 DIAGNOSIS — I87331 Chronic venous hypertension (idiopathic) with ulcer and inflammation of right lower extremity: Secondary | ICD-10-CM | POA: Diagnosis not present

## 2019-02-14 NOTE — Progress Notes (Signed)
JACEYON, THORNBERRY ([id number]) Visit Report for 02/14/2019 HPI Details Patient Name: Date of Service: LIGE, BLANCHETTE 02/14/2019 2:00 PM Medical Record [id number] Patient Account Number: [id number] Date of Birth/Sex: Treating RN: 11-11-1960 (58 y.o. [name] Primary Care Provider: Sinclair Ship Other Clinician: Referring Provider: Treating Provider/Extender:Robson, Gustavo Lah, Cynda Familia in Treatment: 5 History of Present Illness HPI Description: ADMISSION 01/10/2019 This is a 58 year old man who has rheumatoid arthritis on Remicade. He is not a diabetic. He had forefoot surgery by Dr. Bettye Boeck on June 25 for correction of hammertoes. At some point in the postop follow-up he presented with multiple wounds on the left foot. I do not really have a good description at this point but I will try to look through [facility] health link. He had arterial studies on 11/28/2018 that were within normal limits. Seen by podiatry on 8/24 with multiple left foot and lower extremity ulcers. He was treated with Bactrim and Cipro. He was hospitalized from 8/28 through 8/30 with wounds on the left anterior foot. An MRI was negative at that point the wounds were on the left anterior and left medial foot. Felt to have cellulitis. He saw his primary doctor in follow-up on 9/3 and felt to have pressure ulcers on the foot. The patient states that he developed these after some acute swelling last month. This may have been a result of infection. He comes in today with multiple wounds on his toes dorsal feet and distal lower extremities anteriorly. The exact cause of this is not completely clear. He does not have a prior wound history. He has multiple lower extremity wounds including the left medial foot, left dorsal foot, left second toe, left anterior mid tibia, right dorsal foot, right fourth toe medially and the right medial lower leg. The areas on the left medial foot and left dorsal foot are large  superficial wounds most of the rest of these are small punched out looking areas. He does not complain of a lot of pain Past medical history; hypertension, rheumatoid arthritis on Remicade infusions, hyperlipidemia right forefoot surgery on 6/25, he is on Xarelto at this point for reasons that are not totally clear. Arterial studies on 11/28/2018 showed an ABI of 0.98 on the right 1.01 on the left. TBI's of 0.79 on the right 0.75 on the left and triphasic waveforms bilaterally 9/24; wounds on his bilateral lower feet and lower legs. We have been using silver collagen on the wounds. 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 Follows with Dr. Raliegh Ip" rheumatology at [facility]. 10/1; patient with wounds on his bilateral lower feet and lower legs we have been using silver collagen. 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 He does not have macrovascular disease by previous noninvasive studies in August. 10/8; patient with wounds on his bilateral lower feet and legs. Small punched out areas. I have no doubt he has chronic venous disease however these wounds do not look like classic venous ulcers. He is generally been making good improvements. 10/15; patients with small wounds on his bilateral lower legs. He has nothing left on the right 3 wounds on the left are making good progress. On the left we have the left medial foot, left anterior tibial area and the larger area on the dorsal left foot in close proximity to the second  and third toes. All of these look a lot better. The patient has clear evidence of chronic venous insufficiency although these wounds did not look like venous insufficiency wounds 10/22; patient has no wounds left on the right. He has 3 wounds on the left  arm making good progress. Left medial foot left anterior tibia and the left dorsal foot. These are likely related to venous insufficiency. He has dark fibrotic adherent skin in his bilateral ankle areas. Electronic Signature(s) Signed: 02/14/2019 6:29:04 PM By: Linton Ham MD Entered By: Linton Ham on 02/14/2019 15:52:12 -------------------------------------------------------------------------------- Physical Exam Details Patient Name: Date of Service: SAURAV, SHARER 02/14/2019 2:00 PM Medical Record [id number] Patient Account Number: [id number] Date of Birth/Sex: Treating RN: 07-Dec-1960 (58 y.o. [name] Primary Care Provider: Sinclair Ship Other Clinician: Referring Provider: Treating Provider/Extender:Robson, Gustavo Lah, Cynda Familia in Treatment: 5 Constitutional Sitting or standing Blood Pressure is within target range for patient.. Pulse regular and within target range for patient.Marland Kitchen Respirations regular, non-labored and within target range.. Temperature is normal and within the target range for the patient.Marland Kitchen Appears in no distress. Eyes Conjunctivae clear. No discharge.no icterus. Respiratory work of breathing is normal. Cardiovascular Fetal pulses are present. 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 Integumentary (Hair, Skin) Hemosiderin deposition in the bilateral lower extremities. Psychiatric appears at normal baseline. Notes Wound exam; the patient has 3 removing remaining wounds although these continue to be smaller. Very tiny open area on the left medial foot, left anterior tibia also was smaller. The left dorsal foot is the largest of his wounds this looks healthier dimensions are improved. He has significant hemosiderin deposition and tight fibrotic skin around the ankle area. He probably has chronic venous insufficiency Electronic  Signature(s) Signed: 02/14/2019 6:29:04 PM By: Linton Ham MD Entered By: Linton Ham on 02/14/2019 15:54:43 -------------------------------------------------------------------------------- Physician Orders Details Patient Name: Date of Service: CLAUDELL, NOLDEN 02/14/2019 2:00 PM Medical Record [id number] Patient Account Number: [id number] Date of Birth/Sex: Treating RN: [birth year] (58 y.o. [name] Primary Care Provider: Sinclair Ship Other Clinician: Referring Provider: Treating Provider/Extender:Robson, Gustavo Lah, Cynda Familia in Treatment: 5 Verbal / Phone Orders: No Diagnosis Coding ICD-10 Coding Code Description L97.521 Non-pressure chronic ulcer of other part of left foot limited to breakdown of skin L97.821 Non-pressure chronic ulcer of other part of left lower leg limited to breakdown of skin I87.331 Chronic venous hypertension (idiopathic) with ulcer and inflammation of right lower extremity M05.80 Other rheumatoid arthritis with rheumatoid factor of unspecified site Follow-up Appointments Return Appointment in 2 weeks. Dressing Change Frequency Wound #4 Left,Medial Foot Change dressing three times week. - twice a week by home health. Wound #5 Left,Dorsal Foot Change dressing three times week. - twice a week by home health. Wound #6 Left,Anterior Lower Leg Change dressing three times week. - twice a week by home health. Skin Barriers/Peri-Wound Care TCA Cream or Ointment - liberally to both legs, feet, toes, and all wounds in clinic and at home by home health. Wound Cleansing Wound #4 Left,Medial Foot Clean wound with Wound Cleanser - or normal saline Wound #5 Left,Dorsal Foot Clean wound with Wound Cleanser - or normal saline Wound #6 Left,Anterior Lower Leg Clean wound with Wound Cleanser - or normal saline Primary Wound Dressing Wound #4 Left,Medial Foot Silver Collagen - moisten with saline or hydrogel. Wound #5 Left,Dorsal  Foot Silver Collagen - moisten with saline or hydrogel. Wound #6 Left,Anterior Lower Leg Silver Collagen - moisten with  saline or hydrogel. Secondary Dressing Wound #4 Left,Medial Foot Dry Gauze ABD pad Wound #5 Left,Dorsal Foot Dry Gauze ABD pad Wound #6 Left,Anterior Lower Leg Dry Gauze ABD pad Edema Control 3 Layer Compression System - Left Lower Extremity Patient to wear own compression stockings - right leg daily Avoid standing for long periods of time Elevate legs to the level of the heart or above for 30 minutes daily and/or when sitting, a frequency of: - throughout the day. Support Garment 20-30 mm/Hg pressure to: - Patient to call and purchase compression stockings from [employer]. They will ship to your house. Please bring into clinic to show how to apply. [place] skilled nursing for wound care. - [facility]. Electronic Signature(s) Signed: 02/14/2019 6:29:04 PM By: Linton Ham MD Signed: 02/14/2019 6:35:09 PM By: Deon Pilling Entered By: Deon Pilling on 02/14/2019 15:23:45 -------------------------------------------------------------------------------- Problem List Details Patient Name: Date of Service: ELDO, WOHLER 02/14/2019 2:00 PM Medical Record [id number] Patient Account Number: [id number] Date of Birth/Sex: Treating RN: May 10, 1960 (58 y.o. [name] Primary Care Provider: Sinclair Ship Other Clinician: Referring Provider: Treating Provider/Extender:Robson, Gustavo Lah, Cynda Familia in Treatment: 5 Active Problems ICD-10 Evaluated Encounter Code Description Active Date Today Diagnosis L97.521 Non-pressure chronic ulcer of other part of left foot 01/10/2019 No Yes limited to breakdown of skin L97.821 Non-pressure chronic ulcer of other part of left lower 01/10/2019 No Yes leg limited to breakdown of skin I87.331 Chronic venous hypertension (idiopathic) with ulcer 01/10/2019 No Yes and  inflammation of right lower extremity M05.80 Other rheumatoid arthritis with rheumatoid factor of 01/10/2019 No Yes unspecified site Inactive Problems ICD-10 Code Description Active Date Inactive Date L97.511 Non-pressure chronic ulcer of other part of right foot limited to 01/10/2019 01/10/2019 breakdown of skin L97.812 Non-pressure chronic ulcer of other part of right lower leg with 01/10/2019 01/10/2019 fat layer exposed Resolved Problems Electronic Signature(s) Signed: 02/14/2019 6:29:04 PM By: Linton Ham MD Entered By: Linton Ham on 02/14/2019 15:51:00 -------------------------------------------------------------------------------- Progress Note Details Patient Name: Date of Service: GUINN, WEISHAUPT 02/14/2019 2:00 PM Medical Record [id number] Patient Account Number: [id number] Date of Birth/Sex: Treating RN: 06-17-1960 (58 y.o. [name] Primary Care Provider: Sinclair Ship Other Clinician: Referring Provider: Treating Provider/Extender:Robson, Gustavo Lah, Cynda Familia in Treatment: 5 Subjective History of Present Illness (HPI) ADMISSION 01/10/2019 This is a 58 year old man who has rheumatoid arthritis on Remicade. He is not a diabetic. He had forefoot surgery by Dr. Bettye Boeck on June 25 for correction of hammertoes. At some point in the postop follow-up he presented with multiple wounds on the left foot. I do not really have a good description at this point but I will try to look through [facility] health link. He had arterial studies on 11/28/2018 that were within normal limits. Seen by podiatry on 8/24 with multiple left foot and lower extremity ulcers. He was treated with Bactrim and Cipro. He was hospitalized from 8/28 through 8/30 with wounds on the left anterior foot. An MRI was negative at that point the wounds were on the left anterior and left medial foot. Felt to have cellulitis. He saw his primary doctor in follow-up on 9/3 and felt to  have pressure ulcers on the foot. The patient states that he developed these after some acute swelling last month. This may have been a result of infection. He comes in today with multiple wounds on his toes dorsal feet and distal lower extremities anteriorly. The exact cause of this is not completely clear. He  does not have a prior wound history. He has multiple lower extremity wounds including the left medial foot, left dorsal foot, left second toe, left anterior mid tibia, right dorsal foot, right fourth toe medially and the right medial lower leg. The areas on the left medial foot and left dorsal foot are large superficial wounds most of the rest of these are small punched out looking areas. He does not complain of a lot of pain Past medical history; hypertension, rheumatoid arthritis on Remicade infusions, hyperlipidemia right forefoot surgery on 6/25, he is on Xarelto at this point for reasons that are not totally clear. Arterial studies on 11/28/2018 showed an ABI of 0.98 on the right 1.01 on the left. TBI's of 0.79 on the right 0.75 on the left and triphasic waveforms bilaterally 9/24; wounds on his bilateral lower feet and lower legs. We have been using silver collagen on the wounds. 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 Follows with Dr. Raliegh Ip" rheumatology at [facility]. 10/1; patient with wounds on his bilateral lower feet and lower legs we have been using silver collagen. 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 He does not have macrovascular disease by previous noninvasive studies in August. 10/8; patient with wounds on his bilateral lower feet and legs. Small punched out areas. I have no doubt he has chronic venous disease however these wounds do not  look like classic venous ulcers. He is generally been making good improvements. 10/15; patients with small wounds on his bilateral lower legs. He has nothing left on the right 3 wounds on the left are making good progress. On the left we have the left medial foot, left anterior tibial area and the larger area on the dorsal left foot in close proximity to the second and third toes. All of these look a lot better. The patient has clear evidence of chronic venous insufficiency although these wounds did not look like venous insufficiency wounds 10/22; patient has no wounds left on the right. He has 3 wounds on the left arm making good progress. Left medial foot left anterior tibia and the left dorsal foot. These are likely related to venous insufficiency. He has dark fibrotic adherent skin in his bilateral ankle areas. Objective Constitutional Sitting or standing Blood Pressure is within target range for patient.. Pulse regular and within target range for patient.Marland Kitchen Respirations regular, non-labored and within target range.. Temperature is normal and within the target range for the patient.Marland Kitchen Appears in no distress. Vitals Time Taken: 2:38 PM, Height: 63 in, Weight: 162 lbs, BMI: 28.7, Temperature: 98.6 F, Pulse: 67 bpm, Respiratory Rate: 16 breaths/min, Blood Pressure: 107/64 mmHg. Eyes Conjunctivae clear. No discharge.no icterus. Respiratory work of breathing is normal. Cardiovascular Fetal pulses are present. 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 Psychiatric appears at normal baseline. General Notes: Wound exam; the patient has 3 removing remaining wounds although these continue to be smaller. Very tiny open area on the left medial foot, left anterior tibia also was smaller. The left dorsal foot is the largest of his wounds this looks healthier dimensions are improved. ooHe has significant  hemosiderin deposition and tight fibrotic skin around the ankle area. He probably has chronic venous insufficiency Integumentary (Hair, Skin) Hemosiderin deposition in the bilateral  lower extremities. Wound #4 status is Open. Original cause of wound was Gradually Appeared. The wound is located on the Left,Medial Foot. The wound measures 0.8cm length x 0.9cm width x 0.1cm depth; 0.565cm^2 area and 0.057cm^3 volume. There is Fat Layer (Subcutaneous Tissue) Exposed exposed. There is no tunneling or undermining noted. There is a medium amount of serosanguineous drainage noted. The wound margin is well defined and not attached to the wound base. There is large (67-100%) red granulation within the wound bed. There is no necrotic tissue within the wound bed. Wound #5 status is Open. Original cause of wound was Gradually Appeared. The wound is located on the Left,Dorsal Foot. The wound measures 1.9cm length x 1.6cm width x 0.1cm depth; 2.388cm^2 area and 0.239cm^3 volume. There is Fat Layer (Subcutaneous Tissue) Exposed exposed. There is no tunneling or undermining noted. There is a medium amount of serosanguineous drainage noted. The wound margin is well defined and not attached to the wound base. There is large (67-100%) red granulation within the wound bed. There is no necrotic tissue within the wound bed. Wound #6 status is Open. Original cause of wound was Gradually Appeared. The wound is located on the Left,Anterior Lower Leg. The wound measures 0.3cm length x 0.3cm width x 0.1cm depth; 0.071cm^2 area and 0.007cm^3 volume. There is Fat Layer (Subcutaneous Tissue) Exposed exposed. There is no tunneling or undermining noted. There is a small amount of serosanguineous drainage noted. The wound margin is flat and intact. There is large (67-100%) pink granulation within the wound bed. There is a small (1-33%) amount of necrotic tissue within the wound bed including Adherent Slough. 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 There was a Three Layer Compression Therapy Procedure with a pre-treatment ABI of 1 by Kela Millin, RN. Post procedure Diagnosis Wound #4: Same as Pre-Procedure Wound #5 Pre-procedure diagnosis of Wound #5 is an Inflammatory located on the Left,Dorsal Foot . There was a Three Layer Compression Therapy Procedure with a pre-treatment ABI of 1 by Kela Millin, RN. Post procedure Diagnosis Wound #5: Same as Pre-Procedure Wound #6 Pre-procedure diagnosis of Wound #6 is a Venous Leg Ulcer located on the Left,Anterior Lower Leg . There was a Three Layer Compression Therapy Procedure with a pre-treatment ABI of 1 by Kela Millin, RN. Post procedure Diagnosis Wound #6: Same as Pre-Procedure Plan Follow-up Appointments: Return Appointment in 2 weeks. Dressing Change Frequency: Wound #4 Left,Medial Foot: Change dressing three times week. - twice a week by home health. Wound #5 Left,Dorsal Foot: Change dressing three times week. - twice a week by home health. Wound #6 Left,Anterior Lower Leg: Change dressing three times week. - twice a week by home health. Skin Barriers/Peri-Wound Care: TCA Cream or Ointment - liberally to both legs, feet, toes, and all wounds in clinic and at home by home health. Wound Cleansing: Wound #4 Left,Medial Foot: Clean wound with Wound Cleanser - or normal saline Wound #5 Left,Dorsal Foot: Clean wound with Wound Cleanser - or normal saline Wound #6 Left,Anterior Lower Leg: Clean wound with Wound  Cleanser - or normal saline Primary Wound Dressing: Wound #4 Left,Medial Foot:  Silver Collagen - moisten with saline or hydrogel. Wound #5 Left,Dorsal Foot: Silver Collagen - moisten with saline or hydrogel. Wound #6 Left,Anterior Lower Leg: Silver Collagen - moisten with saline or hydrogel. Secondary Dressing: Wound #4 Left,Medial Foot: Dry Gauze ABD pad Wound #5 Left,Dorsal Foot: Dry Gauze ABD pad Wound #6 Left,Anterior Lower Leg: Dry Gauze ABD pad Edema Control: 3 Layer Compression System - Left Lower Extremity Patient to wear own compression stockings - right leg daily Avoid standing for long periods of time Elevate legs to the level of the heart or above for 30 minutes daily and/or when sitting, a frequency of: - throughout the day. Support Garment 20-30 mm/Hg pressure to: - Patient to call and purchase compression stockings from [employer]. They will ship to your house. Please bring into clinic to show how to apply. Home Health: [place] [facility] for wound care. - [facility]. 1. Continue with silver collagen 2. ABDs under 3 layer compression 3. We had some preliminary discussion about stockings. The patient cannot get over the toe stockings on because of rheumatoid changes in his hands. He does live with a cousin. Perhaps there might be some help available for this man 4 when he first came in here I had some thoughts about rheumatoid vasculitis based on the small punched out areas he had bilaterally. I did not really pursue this. Electronic Signature(s) Signed: 02/14/2019 6:29:04 PM By: Linton Ham MD Entered By: Linton Ham on 02/14/2019 15:56:30 -------------------------------------------------------------------------------- SuperBill Details Patient Name: Date of Service: GARMON, SHINA 02/14/2019 Medical Record [id number] Patient Account Number: [id number] Date of Birth/Sex: Treating RN: 07-21-1960 (58 y.o. [name] Primary Care Provider: Sinclair Ship Other Clinician: Referring Provider: Treating Provider/Extender:Robson, Gustavo Lah, Cynda Familia in Treatment: 5 Diagnosis Coding ICD-10 Codes Code Description [phone] Non-pressure chronic ulcer of other part of left foot limited to breakdown of skin L97.821 Non-pressure chronic ulcer of other part of left lower leg limited to breakdown of skin I87.331 Chronic venous hypertension (idiopathic) with ulcer and inflammation of right lower extremity M05.80 Other rheumatoid arthritis with rheumatoid factor of unspecified site Facility Procedures The patient participates with Medicare or their insurance follows the Medicare Facility Guidelines: CPT4 Code Description Modifier Quantity [id number] (Facility Use Only) [phone] - [place] 1 Physician Procedures CPT4 Code Description: [id number] - WC PHYS LEVEL 3 - EST PT ICD-10 Diagnosis Description L97.521 Non-pressure chronic ulcer of other part of left foot limi L97.821 Non-pressure chronic ulcer of other part of left lower leg skin I87.331 Chronic venous  hypertension (idiopathic) with ulcer and in lower extremity Modifier: ted to breakdown limited to brea flammation of ri Quantity: 1 of skin kdown of ght Electronic Signature(s) Signed: 02/14/2019 6:29:04 PM By: Linton Ham MD Entered By: Linton Ham on 02/14/2019 15:56:49

## 2019-02-14 NOTE — Telephone Encounter (Signed)
Pt called requesting a refill of oxycodone

## 2019-02-14 NOTE — Telephone Encounter (Signed)
This is a Jonathan [name] patient.   

## 2019-02-17 ENCOUNTER — Other Ambulatory Visit: Payer: Self-pay | Admitting: Podiatry

## 2019-02-17 MED ORDER — OXYCODONE-ACETAMINOPHEN 5-325 MG PO TABS: 1.0000 | ORAL_TABLET | Freq: Four times a day (QID) | ORAL | 0 refills | Status: DC | PRN

## 2019-02-17 NOTE — Telephone Encounter (Signed)
Rx sent 

## 2019-02-25 ENCOUNTER — Telehealth: Payer: Self-pay | Admitting: Podiatry

## 2019-02-25 NOTE — Telephone Encounter (Signed)
I informed pt that if he could identify the type of boot he needed an assistant could help him and we could sent charges to his insurance.

## 2019-02-25 NOTE — Telephone Encounter (Signed)
Patient called to see if he can get new boots they have velcro and they were giving by [employer] not sure what kind of boots he uses from when he had surgery and one when he had sores

## 2019-02-28 ENCOUNTER — Encounter (HOSPITAL_BASED_OUTPATIENT_CLINIC_OR_DEPARTMENT_OTHER): Payer: Medicare Other | Attending: Internal Medicine | Admitting: Internal Medicine

## 2019-02-28 ENCOUNTER — Other Ambulatory Visit: Payer: Self-pay

## 2019-02-28 DIAGNOSIS — Z7901 Long term (current) use of anticoagulants: Secondary | ICD-10-CM | POA: Diagnosis not present

## 2019-02-28 DIAGNOSIS — L97521 Non-pressure chronic ulcer of other part of left foot limited to breakdown of skin: Secondary | ICD-10-CM | POA: Insufficient documentation

## 2019-02-28 DIAGNOSIS — M059 Rheumatoid arthritis with rheumatoid factor, unspecified: Secondary | ICD-10-CM | POA: Insufficient documentation

## 2019-02-28 DIAGNOSIS — I87331 Chronic venous hypertension (idiopathic) with ulcer and inflammation of right lower extremity: Secondary | ICD-10-CM | POA: Diagnosis not present

## 2019-02-28 DIAGNOSIS — E785 Hyperlipidemia, unspecified: Secondary | ICD-10-CM | POA: Diagnosis not present

## 2019-02-28 DIAGNOSIS — L97821 Non-pressure chronic ulcer of other part of left lower leg limited to breakdown of skin: Secondary | ICD-10-CM | POA: Insufficient documentation

## 2019-02-28 DIAGNOSIS — I10 Essential (primary) hypertension: Secondary | ICD-10-CM | POA: Diagnosis not present

## 2019-02-28 NOTE — Progress Notes (Signed)
IBN, BROSKY ([id number]) Visit Report for 02/28/2019 HPI Details Patient Name: Date of Service: Jonathan [name], Jonathan [name] 02/28/2019 2:00 PM Medical Record [id number] Patient Account Number: [id number] Date of Birth/Sex: Treating RN: 1960/06/27 (58 y.o. [name] Primary Care Provider: Sinclair Ship Other Clinician: Referring Provider: Treating Provider/Extender:Duquan Gillooly, Gustavo Lah, Cynda Familia in Treatment: 7 History of Present Illness HPI Description: ADMISSION 01/10/2019 This is a 58 year old man who has rheumatoid arthritis on Remicade. He is not a diabetic. He had forefoot surgery by Dr. Bettye Boeck on June 25 for correction of hammertoes. At some point in the postop follow-up he presented with multiple wounds on the left foot. I do not really have a good description at this point but I will try to look through [facility] health link. He had arterial studies on 11/28/2018 that were within normal limits. Seen by podiatry on 8/24 with multiple left foot and lower extremity ulcers. He was treated with Bactrim and Cipro. He was hospitalized from 8/28 through 8/30 with wounds on the left anterior foot. An MRI was negative at that point the wounds were on the left anterior and left medial foot. Felt to have cellulitis. He saw his primary doctor in follow-up on 9/3 and felt to have pressure ulcers on the foot. The patient states that he developed these after some acute swelling last month. This may have been a result of infection. He comes in today with multiple wounds on his toes dorsal feet and distal lower extremities anteriorly. The exact cause of this is not completely clear. He does not have a prior wound history. He has multiple lower extremity wounds including the left medial foot, left dorsal foot, left second toe, left anterior mid tibia, right dorsal foot, right fourth toe medially and the right medial lower leg. The areas on the left medial foot and left dorsal foot are large  superficial wounds most of the rest of these are small punched out looking areas. He does not complain of a lot of pain Past medical history; hypertension, rheumatoid arthritis on Remicade infusions, hyperlipidemia right forefoot surgery on 6/25, he is on Xarelto at this point for reasons that are not totally clear. Arterial studies on 11/28/2018 showed an ABI of 0.98 on the right 1.01 on the left. TBI's of 0.79 on the right 0.75 on the left and triphasic waveforms bilaterally 9/24; wounds on his bilateral lower feet and lower legs. We have been using silver collagen on the wounds. 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 Follows with Dr. Raliegh Ip" rheumatology at [facility]. 10/1; patient with wounds on his bilateral lower feet and lower legs we have been using silver collagen. 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 He does not have macrovascular disease by previous noninvasive studies in August. 10/8; patient with wounds on his bilateral lower feet and legs. Small punched out areas. I have no doubt he has chronic venous disease however these wounds do not look like classic venous ulcers. He is generally been making good improvements. 10/15; patients with small wounds on his bilateral lower legs. He has nothing left on the right 3 wounds on the left are making good progress. On the left we have the left medial foot, left anterior tibial area and the larger area on the dorsal left foot in close proximity to the second  and third toes. All of these look a lot better. The patient has clear evidence of chronic venous insufficiency although these wounds did not look like venous insufficiency wounds 10/22; patient has no wounds left on the right. He has 3 wounds on the left  arm making good progress. Left medial foot left anterior tibia and the left dorsal foot. These are likely related to venous insufficiency. He has dark fibrotic adherent skin in his bilateral ankle areas. 11/5; still no open wounds on the right. Left medial foot and the left dorsal foot just proximal to the toes of the 2 remaining wounds. The area on the left anterior tibia is healed Electronic Signature(s) Signed: 02/28/2019 5:45:12 PM By: Linton Ham MD Entered By: Linton Ham on 02/28/2019 15:33:43 -------------------------------------------------------------------------------- Physical Exam Details Patient Name: Date of Service: Jonathan [name], Jonathan [name] 02/28/2019 2:00 PM Medical Record [id number] Patient Account Number: [id number] Date of Birth/Sex: Treating RN: Mar 21, 1961 (58 y.o. [name] Primary Care Provider: Sinclair Ship Other Clinician: Referring Provider: Treating Provider/Extender:Traivon Morrical, Gustavo Lah, Cynda Familia in Treatment: 7 Constitutional Sitting or standing Blood Pressure is within target range for patient.. Pulse regular and within target range for patient.Marland Kitchen Respirations regular, non-labored and within target range.. Temperature is normal and within the target range for the patient.Marland Kitchen Appears in no distress. Respiratory work of breathing is normal. Cardiovascular It will pulses are palpable. He does not have a lot of edema is mild bilaterally. 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 Integumentary (Hair, Skin) Hemosiderin deposition in the bilateral lower legs. Psychiatric appears at normal baseline. Notes Wound exam; the patient has the area on the left anterior tibia healed. Left medial foot still open and left dorsal foot just proximal to the toes is open but smaller. The area on the left anterior tibia is healed Electronic Signature(s) Signed: 02/28/2019 5:45:12 PM By: Linton Ham MD Entered By: Linton Ham  on 02/28/2019 15:36:11 -------------------------------------------------------------------------------- Physician Orders Details Patient Name: Date of Service: UNDRA, BLAZIER 02/28/2019 2:00 PM Medical Record [id number] Patient Account Number: [id number] Date of Birth/Sex: Treating RN: [birth year] (58 y.o. [name] Primary Care Provider: Sinclair Ship Other Clinician: Referring Provider: Treating Provider/Extender:Britten Parady, Gustavo Lah, Cynda Familia in Treatment: 7 Verbal / Phone Orders: No Diagnosis Coding ICD-10 Coding Code Description L97.521 Non-pressure chronic ulcer of other part of left foot limited to breakdown of skin L97.821 Non-pressure chronic ulcer of other part of left lower leg limited to breakdown of skin I87.331 Chronic venous hypertension (idiopathic) with ulcer and inflammation of right lower extremity M05.80 Other rheumatoid arthritis with rheumatoid factor of unspecified site Follow-up Appointments Return Appointment in 2 weeks. Dressing Change Frequency Wound #4 Left,Medial Foot Change dressing three times week. - twice a week by home health. Wound #5 Left,Dorsal Foot Change dressing three times week. - twice a week by home health. Wound #6 Left,Anterior Lower Leg Change dressing three times week. - twice a week by home health. Skin Barriers/Peri-Wound Care TCA Cream or Ointment - liberally to both legs, feet, toes, and all wounds in clinic and at home by home health. Wound Cleansing Wound #4 Left,Medial Foot Clean wound with Wound Cleanser - or normal saline Wound #5 Left,Dorsal Foot Clean wound with Wound Cleanser - or normal saline Wound #6 Left,Anterior Lower Leg Clean wound with Wound Cleanser - or normal saline Primary Wound Dressing Wound #4 Left,Medial Foot Silver Collagen - moisten with saline or hydrogel. Wound #5 Left,Dorsal Foot Silver Collagen - moisten with saline or hydrogel. Wound #6 Left,Anterior  Lower Leg Silver  Collagen - moisten with saline or hydrogel. Secondary Dressing Wound #4 Left,Medial Foot Dry Gauze ABD pad Wound #5 Left,Dorsal Foot Dry Gauze ABD pad Wound #6 Left,Anterior Lower Leg Dry Gauze ABD pad Edema Control 3 Layer Compression System - Left Lower Extremity Patient to wear own compression stockings - right leg daily Avoid standing for long periods of time Elevate legs to the level of the heart or above for 30 minutes daily and/or when sitting, a frequency of: - throughout the day. Support Garment 20-30 mm/Hg pressure to: - Patient to call and purchase compression stockings from [employer]. They will ship to your house. Please bring into clinic to show how to apply. [facility] skilled nursing for [facility] care[facility] - [facility]. Electronic Signature(s) Signed: 02/28/2019 5:16:49 PM By: Kela Millin Signed: 02/28/2019 5:45:12 PM By: Linton Ham MD Entered By: Kela Millin on 02/28/2019 14:14:51 -------------------------------------------------------------------------------- Problem List Details Patient Name: Date of Service: AUSTIN, BROADWATER 02/28/2019 2:00 PM Medical Record [id number] Patient Account Number: [id number] Date of Birth/Sex: Treating RN: Sep 24, 1960 (58 y.o. [name] Primary Care Provider: Sinclair Ship Other Clinician: Referring Provider: Treating Provider/Extender:Jonice Cerra, Gustavo Lah, Cynda Familia in Treatment: 7 Active Problems ICD-10 Evaluated Encounter Code Description Active Date Today Diagnosis L97.521 Non-pressure chronic ulcer of other part of left foot 01/10/2019 No Yes limited to breakdown of skin L97.821 Non-pressure chronic ulcer of other part of left lower 01/10/2019 No Yes leg limited to breakdown of skin I87.331 Chronic venous hypertension (idiopathic) with ulcer 01/10/2019 No Yes and inflammation of right lower extremity M05.80 Other rheumatoid arthritis with rheumatoid factor of  01/10/2019 No Yes unspecified site Inactive Problems ICD-10 Code Description Active Date Inactive Date L97.511 Non-pressure chronic ulcer of other part of right foot limited to 01/10/2019 01/10/2019 breakdown of skin L97.812 Non-pressure chronic ulcer of other part of right lower leg with 01/10/2019 01/10/2019 fat layer exposed Resolved Problems Electronic Signature(s) Signed: 02/28/2019 5:45:12 PM By: Linton Ham MD Entered By: Linton Ham on 02/28/2019 15:32:49 -------------------------------------------------------------------------------- Progress Note Details Patient Name: Date of Service: DONYEA, BESEDA 02/28/2019 2:00 PM Medical Record [id number] Patient Account Number: [id number] Date of Birth/Sex: Treating RN: [birth year] (58 y.o. [name] Primary Care Provider: Sinclair Ship Other Clinician: Referring Provider: Treating Provider/Extender:Zubin Pontillo, Gustavo Lah, Cynda Familia in Treatment: 7 Subjective History of Present Illness (HPI) ADMISSION 01/10/2019 This is a 58 year old man who has rheumatoid arthritis on Remicade. He is not a diabetic. He had forefoot surgery by Dr. Bettye Boeck on June 25 for correction of hammertoes. At some point in the postop follow-up he presented with multiple wounds on the left foot. I do not really have a good description at this point but I will try to look through [facility] health link. He had arterial studies on 11/28/2018 that were within normal limits. Seen by podiatry on 8/24 with multiple left foot and lower extremity ulcers. He was treated with Bactrim and Cipro. He was hospitalized from 8/28 through 8/30 with wounds on the left anterior foot. An MRI was negative at that point the wounds were on the left anterior and left medial foot. Felt to have cellulitis. He saw his primary doctor in follow-up on 9/3 and felt to have pressure ulcers on the foot. The patient states that he developed these after some acute swelling last  month. This may have been a result of infection. He comes in today with multiple wounds on his toes dorsal feet and distal lower extremities anteriorly. The exact  cause of this is not completely clear. He does not have a prior wound history. He has multiple lower extremity wounds including the left medial foot, left dorsal foot, left second toe, left anterior mid tibia, right dorsal foot, right fourth toe medially and the right medial lower leg. The areas on the left medial foot and left dorsal foot are large superficial wounds most of the rest of these are small punched out looking areas. He does not complain of a lot of pain Past medical history; hypertension, rheumatoid arthritis on Remicade infusions, hyperlipidemia right forefoot surgery on 6/25, he is on Xarelto at this point for reasons that are not totally clear. Arterial studies on 11/28/2018 showed an ABI of 0.98 on the right 1.01 on the left. TBI's of 0.79 on the right 0.75 on the left and triphasic waveforms bilaterally 9/24; wounds on his bilateral lower feet and lower legs. We have been using silver collagen on the wounds. 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 Follows with Dr. Raliegh Ip" rheumatology at [facility]. 10/1; patient with wounds on his bilateral lower feet and lower legs we have been using silver collagen. 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 He does not have macrovascular disease by previous noninvasive studies in August. 10/8; patient with wounds on his bilateral lower feet and legs. Small punched out areas. I have no doubt he has chronic venous disease however these wounds do not look like classic venous ulcers. He is generally been making good improvements. 10/15; patients with small  wounds on his bilateral lower legs. He has nothing left on the right 3 wounds on the left are making good progress. On the left we have the left medial foot, left anterior tibial area and the larger area on the dorsal left foot in close proximity to the second and third toes. All of these look a lot better. The patient has clear evidence of chronic venous insufficiency although these wounds did not look like venous insufficiency wounds 10/22; patient has no wounds left on the right. He has 3 wounds on the left arm making good progress. Left medial foot left anterior tibia and the left dorsal foot. These are likely related to venous insufficiency. He has dark fibrotic adherent skin in his bilateral ankle areas. 11/5; still no open wounds on the right. Left medial foot and the left dorsal foot just proximal to the toes of the 2 remaining wounds. The area on the left anterior tibia is healed Objective Constitutional Sitting or standing Blood Pressure is within target range for patient.. Pulse regular and within target range for patient.Marland Kitchen Respirations regular, non-labored and within target range.. Temperature is normal and within the target range for the patient.Marland Kitchen Appears in no distress. Vitals Time Taken: 2:09 PM, Height: 63 in, Weight: 162 lbs, BMI: 28.7, Temperature: 98.4 F, Pulse: 68 bpm, Respiratory Rate: 16 breaths/min, Blood Pressure: 129/67 mmHg. Respiratory work of breathing is normal. Cardiovascular It will pulses are palpable. He does not have a lot of edema is mild bilaterally. 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 Psychiatric appears at normal baseline. General Notes: Wound exam; the patient has the area on the left anterior tibia healed. Left medial foot still open and left dorsal foot just proximal to the toes is open but smaller. The area  on the left anterior tibia is healed Integumentary (Hair, Skin) Hemosiderin deposition in the bilateral lower  legs. Wound #4 status is Open. Original cause of wound was Gradually Appeared. The wound is located on the Left,Medial Foot. The wound measures 0.2cm length x 0.2cm width x 0.1cm depth; 0.031cm^2 area and 0.003cm^3 volume. There is Fat Layer (Subcutaneous Tissue) Exposed exposed. There is no tunneling or undermining noted. There is a small amount of serosanguineous drainage noted. The wound margin is well defined and not attached to the wound base. There is large (67-100%) red granulation within the wound bed. There is no necrotic tissue within the wound bed. Wound #5 status is Open. Original cause of wound was Gradually Appeared. The wound is located on the Left,Dorsal Foot. The wound measures 1.6cm length x 1.1cm width x 0.1cm depth; 1.382cm^2 area and 0.138cm^3 volume. There is Fat Layer (Subcutaneous Tissue) Exposed exposed. There is no tunneling or undermining noted. There is a medium amount of serosanguineous drainage noted. The wound margin is well defined and not attached to the wound base. There is large (67-100%) red granulation within the wound bed. There is no necrotic tissue within the wound bed. Wound #6 status is Healed - Epithelialized. Original cause of wound was Gradually Appeared. The wound is located on the Left,Anterior Lower Leg. The wound measures 0cm length x 0cm width x 0cm depth; 0cm^2 area and 0cm^3 volume. There is no tunneling or undermining noted. There is a none present amount of drainage noted. The wound margin is flat and intact. There is no granulation within the wound bed. There is no necrotic tissue within the wound bed. 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 There was a Three Layer Compression Therapy Procedure by Baruch Gouty, RN. Post procedure Diagnosis Wound #4: Same as Pre-Procedure Wound #5 Pre-procedure diagnosis of Wound #5 is an Inflammatory located on the Left,Dorsal Foot . There was a Three Layer Compression Therapy Procedure by Baruch Gouty, RN. Post procedure Diagnosis Wound #5: Same as Pre-Procedure Plan Follow-up Appointments: Return Appointment in 2 weeks. Dressing Change Frequency: Wound #4 Left,Medial Foot: Change dressing three times week. - twice a week by home health. Wound #5 Left,Dorsal Foot: Change dressing three times week. - twice a week by home health. Wound #6 Left,Anterior Lower Leg: Change dressing three times week. - twice a week by home health. Skin Barriers/Peri-Wound Care: TCA Cream or Ointment - liberally to both legs, feet, toes, and all wounds in clinic and at home by home health. Wound Cleansing: Wound #4 Left,Medial Foot: Clean wound with Wound Cleanser - or normal saline Wound #5 Left,Dorsal Foot: Clean wound with Wound Cleanser - or normal saline Wound #6 Left,Anterior Lower Leg: Clean wound with Wound Cleanser - or normal saline Primary Wound Dressing: Wound #4 Left,Medial Foot: Silver Collagen - moisten with saline or hydrogel. Wound #5 Left,Dorsal Foot: Silver Collagen - moisten with saline or hydrogel. Wound #6 Left,Anterior Lower Leg: Silver Collagen - moisten with saline or hydrogel. Secondary Dressing: Wound #4 Left,Medial Foot: Dry Gauze ABD pad Wound #5 Left,Dorsal Foot: Dry Gauze ABD pad Wound #6 Left,Anterior Lower Leg: Dry  Gauze ABD pad Edema Control: 3 Layer Compression System - Left Lower Extremity Patient to wear own compression stockings - right leg daily Avoid standing for long periods of time Elevate legs to the level of  the heart or above for 30 minutes daily and/or when sitting, a frequency of: - throughout the day. Support Garment 20-30 mm/Hg pressure to: - Patient to call and purchase compression stockings from [employer]. They will ship to your house. Please bring into clinic to show how to apply. Home Health: [facility] skilled nursing for wound care. - [facility]. 1. We are continuing the silver collagen, gauze under 3 layer compression on the left. 2. He is supposed to be getting stockings from [facility] in [facility]. 3. I first saw this man I thought these might be vasculitic wounds from rheumatoid arthritis. I was planning to either biopsy or refer him perhaps both. However they have responded so nicely I will put that thought off for the moment. If these things are recurrent then I will reconsider Electronic Signature(s) Signed: 02/28/2019 5:45:12 PM By: Linton Ham MD Entered By: Linton Ham on 02/28/2019 15:38:04 -------------------------------------------------------------------------------- SuperBill Details Patient Name: Date of Service: Jonathan [name], Jonathan [name] 02/28/2019 Medical Record [id number] Patient Account Number: [id number] Date of Birth/Sex: Treating RN: 01/11/1961 (58 y.o. [name] Primary Care Provider: Sinclair Ship Other Clinician: Referring Provider: Treating Provider/Extender:Sharine Cadle, Gustavo Lah, Cynda Familia in Treatment: 7 Diagnosis Coding ICD-10 Codes Code Description [phone] Non-pressure chronic ulcer of other part of left foot limited to breakdown of skin L97.821 Non-pressure chronic ulcer of other part of left lower leg limited to breakdown of skin I87.331 Chronic venous hypertension (idiopathic) with ulcer and inflammation of right lower extremity M05.80 Other rheumatoid arthritis with rheumatoid factor of unspecified site Facility Procedures The patient participates with Medicare or their insurance  follows the Medicare Facility Guidelines: CPT4 Code Description Modifier Quantity [id number] (Facility Use Only) [phone] - [place] [facility] Physician Procedures CPT4 Code Description: [id number] - WC PHYS LEVEL 3 - EST PT ICD-10 Diagnosis Description L97.521 Non-pressure chronic ulcer of other part of left foot limi I87.331 Chronic venous hypertension (idiopathic) with ulcer and in lower extremity M05.80  Other rheumatoid arthritis with rheumatoid factor of unspe Modifier: ted to breakdown flammation of ri cified site Quantity: 1 of skin ght Electronic Signature(s) Signed: 02/28/2019 5:45:12 PM By: Linton Ham MD Entered By: Linton Ham on 02/28/2019 15:38:28

## 2019-03-14 ENCOUNTER — Encounter (HOSPITAL_BASED_OUTPATIENT_CLINIC_OR_DEPARTMENT_OTHER): Payer: Medicare Other | Admitting: Internal Medicine

## 2019-03-14 ENCOUNTER — Other Ambulatory Visit: Payer: Self-pay

## 2019-03-14 DIAGNOSIS — L97521 Non-pressure chronic ulcer of other part of left foot limited to breakdown of skin: Secondary | ICD-10-CM | POA: Diagnosis not present

## 2019-03-14 NOTE — Progress Notes (Signed)
[name], [name] ([id number]) Visit Report for 03/14/2019 HPI Details Patient Name: Date of Service: Jonathan [name], Jonathan [name] 03/14/2019 2:00 PM Medical Record [id number] Patient Account Number: [id number] Date of Birth/Sex: Treating RN: 16-Jul-1960 (58 y.o. Jonathan [name] Primary Care Provider: Sinclair Ship Other Clinician: Referring Provider: Treating Provider/Extender:Robson, Gustavo Lah, Cynda Familia in Treatment: 9 History of Present Illness HPI Description: ADMISSION 01/10/2019 This is a 58 year old man who has rheumatoid arthritis on Remicade. He is not a diabetic. He had forefoot surgery by Dr. Bettye Boeck on June 25 for correction of hammertoes. At some point in the postop follow-up he presented with multiple wounds on the left foot. I do not really have a good description at this point but I will try to look through [facility] health link. He had arterial studies on 11/28/2018 that were within normal limits. Seen by podiatry on 8/24 with multiple left foot and lower extremity ulcers. He was treated with Bactrim and Cipro. He was hospitalized from 8/28 through 8/30 with wounds on the left anterior foot. An MRI was negative at that point the wounds were on the left anterior and left medial foot. Felt to have cellulitis. He saw his primary doctor in follow-up on 9/3 and felt to have pressure ulcers on the foot. The patient states that he developed these after some acute swelling last month. This may have been a result of infection. He comes in today with multiple wounds on his toes dorsal feet and distal lower extremities anteriorly. The exact cause of this is not completely clear. He does not have a prior wound history. He has multiple lower extremity wounds including the left medial foot, left dorsal foot, left second toe, left anterior mid tibia, right dorsal foot, right fourth toe medially and the right medial lower leg. The areas on the left medial foot and left dorsal foot are large  superficial wounds most of the rest of these are small punched out looking areas. He does not complain of a lot of pain Past medical history; hypertension, rheumatoid arthritis on Remicade infusions, hyperlipidemia right forefoot surgery on 6/25, he is on Xarelto at 58 point for reasons that are not totally clear. Arterial studies on 11/28/2018 showed an ABI of 0.98 on the right 1.01 on the left. TBI's of 0.79 on the right 0.75 on the left and triphasic waveforms bilaterally 9/24; wounds on his bilateral lower feet and lower legs. We have been using silver collagen on the wounds. 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 Follows with Dr. Raliegh Ip" rheumatology at [facility]. 10/1; patient with wounds on his bilateral lower feet and lower legs we have been using silver collagen. 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 He does not have macrovascular disease by previous noninvasive studies in August. 10/8; patient with wounds on his bilateral lower feet and legs. Small punched out areas. I have no doubt he has chronic venous disease however these wounds do not look like classic venous ulcers. He is generally been making good improvements. 10/15; patients with small wounds on his bilateral lower legs. He has nothing left on the right 3 wounds on the left are making good progress. On the left we have the left medial foot, left anterior tibial area and the larger area on the dorsal left foot in close proximity to the second  and third toes. All of these look a lot better. The patient has clear evidence of chronic venous insufficiency although these wounds did not look like venous insufficiency wounds 10/22; patient has no wounds left on the right. He has 3 wounds on the left  arm making good progress. Left medial foot left anterior tibia and the left dorsal foot. These are likely related to venous insufficiency. He has dark fibrotic adherent skin in his bilateral ankle areas. 11/5; still no open wounds on the right. Left medial foot and the left dorsal foot just proximal to the toes of the 2 remaining wounds. The area on the left anterior tibia is healed 11/19; 2-week follow-up. The area on the left medial foot is closed. The only remaining wound is on the left dorsal foot just proximal to the toes. The area on the left anterior tibia also had been previously healed. The patient sees his rheumatologist in early December. I asked him to mention the wounds to the rheumatologist. I had some thoughts about this being rheumatoid vasculitic related and was going to biopsy this although he seems to have done nicely with conservative treatment. The patient has his compression stocking Electronic Signature(s) Signed: 03/14/2019 6:25:25 PM By: Linton Ham MD Entered By: Linton Ham on 03/14/2019 15:41:52 -------------------------------------------------------------------------------- Physical Exam Details Patient Name: Date of Service: [name], [name] 03/14/2019 2:00 PM Medical Record [id number] Patient Account Number: [id number] Date of Birth/Sex: Treating RN: July 02, 1960 (58 y.o. Jonathan [name] Primary Care Provider: Sinclair Ship Other Clinician: Referring Provider: Treating Provider/Extender:Robson, Gustavo Lah, Cynda Familia in Treatment: 9 Constitutional Sitting or standing Blood Pressure is within target range for patient.. Pulse regular and within target range for patient.Marland Kitchen Respirations regular, non-labored and within target range.. Temperature is normal and within the target range for the patient.Marland Kitchen Appears in no distress. Eyes Conjunctivae clear. No discharge.no icterus. Respiratory work of breathing is normal. Cardiovascular Pedal pulses  palpable. Chronic venous disease. Integumentary (Hair, Skin) Changes of chronic venous insufficiency. Psychiatric appears at normal baseline. Notes Wound exam; the patient has the left anterior tibia and left medial foot healed. The only remaining wound is on the dorsal foot just proximal to the toes. Is about the same as last week. The patient did not want to be debrided although there is some debris on the surface of this that may actually require this. He was vigorously scrubbed with Anasept and gauze I was able to get part of this removed no evidence of infection around the wound Electronic Signature(s) Signed: 03/14/2019 6:25:25 PM By: Linton Ham MD Entered By: Linton Ham on 03/14/2019 15:43:33 -------------------------------------------------------------------------------- Physician Orders Details Patient Name: Date of Service: JACAI, COSSAIRT 03/14/2019 2:00 PM Medical Record [id number] Patient Account Number: [id number] Date of Birth/Sex: Treating RN: 05/24/1960 (58 y.o. Jonathan [name] Primary Care Provider: Sinclair Ship Other Clinician: Referring Provider: Treating Provider/Extender:Robson, Gustavo Lah, Cynda Familia in Treatment: 9 Verbal / Phone Orders: No Diagnosis Coding ICD-10 Coding Code Description L97.521 Non-pressure chronic ulcer of other part of left foot limited to breakdown of skin L97.821 Non-pressure chronic ulcer of other part of left lower leg limited to breakdown of skin I87.331 Chronic venous hypertension (idiopathic) with ulcer and inflammation of right lower extremity M05.80 Other rheumatoid arthritis with rheumatoid factor of unspecified site Follow-up Appointments Return Appointment in 2 weeks. - Thursday Dressing Change Frequency Wound #5 Left,Dorsal Foot Change dressing three times week. - twice a week by home health. Skin Barriers/Peri-Wound Care Moisturizing lotion - apply lotion to right leg daily  at night. TCA Cream or  Ointment - liberally to both legs, feet, toes, and all wounds in clinic and at home by home health. Wound Cleansing Wound #5 Left,Dorsal Foot Clean wound with Wound Cleanser - or normal saline Primary Wound Dressing Wound #5 Left,Dorsal Foot Silver Collagen - moisten with saline or hydrogel. Secondary Dressing Wound #5 Left,Dorsal Foot Dry Gauze ABD pad Edema Control 3 Layer Compression System - Left Lower Extremity Patient to wear own compression stockings - right leg daily Avoid standing for long periods of time Elevate legs to the level of the heart or above for 30 minutes daily and/or when sitting, a frequency of: - throughout the day. Support Garment 20-30 mm/Hg pressure to: - show patient how to don compression stockings to right leg. Apply stocking in the morning and remove at night. [place] skilled [facility] for wound care. - [facility]. Electronic Signature(s) Signed: 03/14/2019 6:25:25 PM By: Linton Ham MD Signed: 03/14/2019 6:38:14 PM By: Deon Pilling Entered By: Deon Pilling on 03/14/2019 15:39:32 -------------------------------------------------------------------------------- Problem List Details Patient Name: Date of Service: RANDY, WHITUS 03/14/2019 2:00 PM Medical Record [id number] Patient Account Number: [id number] Date of Birth/Sex: Treating RN: 05/14/1960 (58 y.o. Lorette Ang, Meta.Reding Primary Care Provider: Sinclair Ship Other Clinician: Referring Provider: Treating Provider/Extender:Robson, Gustavo Lah, Cynda Familia in Treatment: 9 Active Problems ICD-10 Evaluated Encounter Code Description Active Date Today Diagnosis L97.521 Non-pressure chronic ulcer of other part of left foot 01/10/2019 No Yes limited to breakdown of skin L97.821 Non-pressure chronic ulcer of other part of left lower 01/10/2019 No Yes leg limited to breakdown of skin I87.331 Chronic venous hypertension (idiopathic) with ulcer 01/10/2019 No  Yes and inflammation of right lower extremity M05.80 Other rheumatoid arthritis with rheumatoid factor of 01/10/2019 No Yes unspecified site Inactive Problems ICD-10 Code Description Active Date Inactive Date L97.511 Non-pressure chronic ulcer of other part of right foot limited to 01/10/2019 01/10/2019 breakdown of skin L97.812 Non-pressure chronic ulcer of other part of right lower leg with 01/10/2019 01/10/2019 fat layer exposed Resolved Problems Electronic Signature(s) Signed: 03/14/2019 6:25:25 PM By: Linton Ham MD Entered By: Linton Ham on 03/14/2019 15:40:10 -------------------------------------------------------------------------------- Progress Note Details Patient Name: Date of Service: RASHAAN, LOVEN 03/14/2019 2:00 PM Medical Record [id number] Patient Account Number: [id number] Date of Birth/Sex: Treating RN: 23-Nov-1960 (58 y.o. Jonathan [name] Primary Care Provider: Sinclair Ship Other Clinician: Referring Provider: Treating Provider/Extender:Robson, Gustavo Lah, Cynda Familia in Treatment: 9 Subjective History of Present Illness (HPI) ADMISSION 01/10/2019 This is a 58 year old man who has rheumatoid arthritis on Remicade. He is not a diabetic. He had forefoot surgery by Dr. Bettye Boeck on June 25 for correction of hammertoes. At some point in the postop follow-up he presented with multiple wounds on the left foot. I do not really have a good description at this point but I will try to look through [facility] health link. He had arterial studies on 11/28/2018 that were within normal limits. Seen by podiatry on 8/24 with multiple left foot and lower extremity ulcers. He was treated with Bactrim and Cipro. He was hospitalized from 8/28 through 8/30 with wounds on the left anterior foot. An MRI was negative at that point the wounds were on the left anterior and left medial foot. Felt to have cellulitis. He saw his primary doctor in follow-up on 9/3 and felt to  have pressure ulcers on the foot. The patient states that he developed these after some acute swelling last month. This may have been a result  of infection. He comes in today with multiple wounds on his toes dorsal feet and distal lower extremities anteriorly. The exact cause of this is not completely clear. He does not have a prior wound history. He has multiple lower extremity wounds including the left medial foot, left dorsal foot, left second toe, left anterior mid tibia, right dorsal foot, right fourth toe medially and the right medial lower leg. The areas on the left medial foot and left dorsal foot are large superficial wounds most of the rest of these are small punched out looking areas. He does not complain of a lot of pain Past medical history; hypertension, rheumatoid arthritis on Remicade infusions, hyperlipidemia right forefoot surgery on 6/25, he is on Xarelto at 58 point for reasons that are not totally clear. Arterial studies on 11/28/2018 showed an ABI of 0.98 on the right 1.01 on the left. TBI's of 0.79 on the right 0.75 on the left and triphasic waveforms bilaterally 9/24; wounds on his bilateral lower feet and lower legs. We have been using silver collagen on the wounds. 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 Follows with Dr. Raliegh Ip" rheumatology at [facility]. 10/1; patient with wounds on his bilateral lower feet and lower legs we have been using silver collagen. 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 He does not have macrovascular disease by previous noninvasive studies in August. 10/8; patient with wounds on his bilateral lower feet and legs. Small punched out areas. I have no doubt he has chronic venous disease however these wounds do not  look like classic venous ulcers. He is generally been making good improvements. 10/15; patients with small wounds on his bilateral lower legs. He has nothing left on the right 3 wounds on the left are making good progress. On the left we have the left medial foot, left anterior tibial area and the larger area on the dorsal left foot in close proximity to the second and third toes. All of these look a lot better. The patient has clear evidence of chronic venous insufficiency although these wounds did not look like venous insufficiency wounds 10/22; patient has no wounds left on the right. He has 3 wounds on the left arm making good progress. Left medial foot left anterior tibia and the left dorsal foot. These are likely related to venous insufficiency. He has dark fibrotic adherent skin in his bilateral ankle areas. 11/5; still no open wounds on the right. Left medial foot and the left dorsal foot just proximal to the toes of the 2 remaining wounds. The area on the left anterior tibia is healed 11/19; 2-week follow-up. The area on the left medial foot is closed. The only remaining wound is on the left dorsal foot just proximal to the toes. The area on the left anterior tibia also had been previously healed. The patient sees his rheumatologist in early December. I asked him to mention the wounds to the rheumatologist. I had some thoughts about this being rheumatoid vasculitic related and was going to biopsy this although he seems to have done nicely with conservative treatment. The patient has his compression stocking Objective Constitutional Sitting or standing Blood Pressure is within target range for patient.. Pulse regular and within target range for patient.Marland Kitchen Respirations regular, non-labored and within target range.. Temperature is normal and  within the target range for the patient.Marland Kitchen Appears in no distress. Vitals Time Taken: 3:00 PM, Height: 63 in, Weight: 162 lbs, BMI: 28.7, Temperature:  98.9 F, Pulse: 101 bpm, Respiratory Rate: 18 breaths/min, Blood Pressure: 124/78 mmHg. Eyes Conjunctivae clear. No discharge.no icterus. Respiratory work of breathing is normal. Cardiovascular Pedal pulses palpable. Chronic venous disease. Psychiatric appears at normal baseline. General Notes: Wound exam; the patient has the left anterior tibia and left medial foot healed. The only remaining wound is on the dorsal foot just proximal to the toes. Is about the same as last week. The patient did not want to be debrided although there is some debris on the surface of this that may actually require this. He was vigorously scrubbed with Anasept and gauze I was able to get part of this removed no evidence of infection around the wound Integumentary (Hair, Skin) Changes of chronic venous insufficiency. Wound #4 status is Healed - Epithelialized. Original cause of wound was Gradually Appeared. The wound is located on the Left,Medial Foot. The wound measures 0cm length x 0cm width x 0cm depth; 0cm^2 area and 0cm^3 volume. There is a none present amount of drainage noted. The wound margin is well defined and not attached to the wound base. There is no granulation within the wound bed. There is no necrotic tissue within the wound bed. Wound #5 status is Open. Original cause of wound was Gradually Appeared. The wound is located on the Left,Dorsal Foot. The wound measures 1.5cm length x 1.1cm width x 0.1cm depth; 1.296cm^2 area and 0.13cm^3 volume. There is Fat Layer (Subcutaneous Tissue) Exposed exposed. There is no tunneling or undermining noted. There is a medium amount of serosanguineous drainage noted. The wound margin is well defined and not attached to the wound base. There is large (67-100%) red granulation within the wound bed. There is a small (1-33%) amount of necrotic tissue within the wound bed including Adherent Slough. Assessment Active Problems ICD-10 Non-pressure chronic ulcer of  other part of left foot limited to breakdown of skin Non-pressure chronic ulcer of other part of left lower leg limited to breakdown of skin Chronic venous hypertension (idiopathic) with ulcer and inflammation of right lower extremity Other rheumatoid arthritis with rheumatoid factor of unspecified site Procedures Wound #5 Pre-procedure diagnosis of Wound #5 is an Inflammatory located on the Left,Dorsal Foot . There was a Three Layer Compression Therapy Procedure with a pre-treatment ABI of 1 by Deon Pilling, RN. Post procedure Diagnosis Wound #5: Same as Pre-Procedure Plan Follow-up Appointments: Return Appointment in 2 weeks. - Thursday Dressing Change Frequency: Wound #5 Left,Dorsal Foot: Change dressing three times week. - twice a week by home health. Skin Barriers/Peri-Wound Care: Moisturizing lotion - apply lotion to right leg daily at night. TCA Cream or Ointment - liberally to both legs, feet, toes, and all wounds in clinic and at home by home health. Wound Cleansing: Wound #5 Left,Dorsal Foot: Clean wound with Wound Cleanser - or normal saline Primary Wound Dressing: Wound #5 Left,Dorsal Foot: Silver Collagen - moisten with saline or hydrogel. Secondary Dressing: Wound #5 Left,Dorsal Foot: Dry Gauze ABD pad Edema Control: 3 Layer Compression System - Left Lower Extremity Patient to wear own compression stockings - right leg daily Avoid standing for long periods of time Elevate legs to the level of the heart or above for 30 minutes daily and/or when sitting, a frequency of: - throughout the day. Support Garment 20-30 mm/Hg pressure to: - show patient how to don compression stockings to  right leg. Apply stocking in the morning and remove at night. Home Health: [place] skilled [facility] for wound care. - [facility]. 1. Silver collagen/ABDs/3 layer compression 2. He may require mechanical debridement when he comes back 2 weeks if this wound stalls.  Under illumination there is material here that probably would benefit from more aggressive removal and Anasept and gauze Electronic Signature(s) Signed: 03/14/2019 6:25:25 PM By: Linton Ham MD Entered By: Linton Ham on 03/14/2019 15:44:30 -------------------------------------------------------------------------------- SuperBill Details Patient Name: Date of Service: INES, STILSON 03/14/2019 Medical Record [id number] Patient Account Number: [id number] Date of Birth/Sex: Treating RN: [birth year] (58 y.o. Jonathan [name] Primary Care Provider: Sinclair Ship Other Clinician: Referring Provider: Treating Provider/Extender:Robson, Gustavo Lah, Cynda Familia in Treatment: 9 Diagnosis Coding ICD-10 Codes Code Description L97.521 Non-pressure chronic ulcer of other part of left foot limited to breakdown of skin L97.821 Non-pressure chronic ulcer of other part of left lower leg limited to breakdown of skin I87.331 Chronic venous hypertension (idiopathic) with ulcer and inflammation of right lower extremity M05.80 Other rheumatoid arthritis with rheumatoid factor of unspecified site Facility Procedures The patient participates with Medicare or their insurance follows the Medicare Facility Guidelines: CPT4 Code Description Modifier Quantity [id number] (Facility Use Only) [phone] - [facility] Physician Procedures CPT4 Code Description: [id number] - WC PHYS LEVEL 3 - EST PT ICD-10 Diagnosis Description L97.521 Non-pressure chronic ulcer of other part of left foot limi I87.331 Chronic venous hypertension (idiopathic) with ulcer and in lower extremity Modifier: ted to breakdown flammation of ri Quantity: 1 of skin ght Electronic Signature(s) Signed: 03/14/2019 6:25:25 PM By: Linton Ham MD Entered By: Linton Ham on 03/14/2019 15:44:51

## 2019-03-18 ENCOUNTER — Telehealth: Payer: Self-pay | Admitting: Podiatry

## 2019-03-18 NOTE — Telephone Encounter (Signed)
Patient is requesting pain meds 

## 2019-03-19 NOTE — Progress Notes (Signed)
BB, ROZEBOOM ([id number]) Visit Report for 03/14/2019 Arrival Information Details Patient Name: Date of Service: PINKNEY, LISSNER 03/14/2019 2:00 PM Medical Record [id number] Patient Account Number: [id number] Date of Birth/Sex: Treating RN: [birth year] (58 y.o. [name] Primary Care Rayel Santizo: [employer] Other Clinician: Referring Zeinab Rodwell: Treating Charlyn Vialpando/Extender:Robson, Gustavo Lah, Cynda Familia in Treatment: 9 Visit Information History Since Last Visit Added or deleted any medications: No Patient Arrived: Crutches Any new allergies or adverse reactions: No Arrival Time: 14:56 Had a fall or experienced change in No Accompanied By: alone activities of daily living that may affect Transfer Assistance: None risk of falls: Patient Identification Verified: Yes Signs or symptoms of abuse/neglect since last No Secondary Verification Process Yes visito Completed: Hospitalized since last visit: No Patient Requires Transmission- No Implantable device outside of the clinic excluding No Based Precautions: cellular tissue based products placed in the center Patient Has Alerts: Yes since last visit: Patient Alerts: Patient on Blood Has Dressing in Place as Prescribed: Yes Thinner Has Compression in Place as Prescribed: Yes ABI Right .98 Pain Present Now: Yes ABI Left 1.01 Electronic Signature(s) Signed: 03/18/2019 2:18:43 PM By: [name] RN, BSN Entered By: [name] on 03/14/2019 14:57:50 -------------------------------------------------------------------------------- Compression Therapy Details Patient Name: Date of Service: TRAITON, LINGG 03/14/2019 2:00 PM Medical Record [id number] Patient Account Number: [id number] Date of Birth/Sex: Treating RN: [birth year] (58 y.o. [name] Primary Care Suhana Wilner: [employer] Other Clinician: Referring Lazariah Savard: Treating Tiasha Helvie/Extender:Robson, Gustavo Lah, Cynda Familia in  Treatment: 9 Compression Therapy Performed for Wound Wound #5 Left,Dorsal Foot Assessment: Performed By: Clinician Deon Pilling, RN Compression Type: Three Layer Pre Treatment ABI: 1 Post Procedure Diagnosis Same as Pre-procedure Electronic Signature(s) Signed: 03/14/2019 6:38:14 PM By: Deon Pilling Entered By: Deon Pilling on 03/14/2019 15:40:14 -------------------------------------------------------------------------------- Encounter Discharge Information Details Patient Name: Date of Service: JAICOB, VANWAGNER 03/14/2019 2:00 PM Medical Record [id number] Patient Account Number: [id number] Date of Birth/Sex: Treating RN: 05/25/1960 (58 y.o. [name] Primary Care Tivis Wherry: [employer] Other Clinician: Referring Lexxi Koslow: Treating Nioka Thorington/Extender:Robson, Gustavo Lah, Cynda Familia in Treatment: 9 Encounter Discharge Information Items Discharge Condition: Stable Ambulatory Status: Crutches Discharge Destination: Home Transportation: Private Auto Accompanied By: alone Schedule Follow-up Appointment: Yes Clinical Summary of Care: Patient Declined Electronic Signature(s) Signed: 03/18/2019 2:18:43 PM By: [name] RN, BSN Entered By: [name] on 03/14/2019 18:29:06 -------------------------------------------------------------------------------- Lower Extremity Assessment Details Patient Name: Date of Service: SHEEHAN, BOUTILIER 03/14/2019 2:00 PM Medical Record [id number] Patient Account Number: [id number] Date of Birth/Sex: Treating RN: [birth year] (58 y.o. [name] Primary Care Lesbia Ottaway: [employer] Other Clinician: Referring Chelisa Hennen: Treating Dj Senteno/Extender:Robson, Gustavo Lah, Cynda Familia in Treatment: 9 Edema Assessment Assessed: [Left: No] [Right: No] Edema: [Left: Yes] [Right: No] Calf Left: Right: Point of Measurement: 36 cm From Medial Instep 29.5 cm 31 cm Ankle Left: Right: Point of Measurement: 8 cm From  Medial Instep 22.3 cm 24.5 cm Vascular Assessment Pulses: Dorsalis Pedis Palpable: [Left:Yes] Electronic Signature(s) Signed: 03/18/2019 2:18:43 PM By: [name] RN, BSN Entered By: [name] on 03/14/2019 15:02:27 -------------------------------------------------------------------------------- Multi Wound Chart Details Patient Name: Date of Service: SAAHIR, GIDCUMB 03/14/2019 2:00 PM Medical Record [id number] Patient Account Number: [id number] Date of Birth/Sex: Treating RN: December 27, 1960 (58 y.o. [name] Primary Care Analynn Daum: [employer] Other Clinician: Referring Cowan Pilar: Treating Lailanie Hasley/Extender:Robson, Gustavo Lah, Cynda Familia in Treatment: 9 Vital Signs Height(in): 63 Pulse(bpm): 101 Weight(lbs): 162 Blood Pressure(mmHg): 124/78 Body Mass Index(BMI): 29 Temperature(F): 98.9 Respiratory 18 Rate(breaths/min): Photos: [4:No Photos] [5:No Photos] [N/A:N/A] Wound Location: [4:Left Foot -  Medial] [5:Left Foot - Dorsal] [N/A:N/A] Wounding Event: [4:Gradually Appeared] [5:Gradually Appeared] [N/A:N/A] Primary Etiology: [4:Inflammatory] [5:Inflammatory] [N/A:N/A] Comorbid History: [4:Rheumatoid Arthritis, Osteoarthritis] [5:Rheumatoid Arthritis, Osteoarthritis] [N/A:N/A] Date Acquired: [4:11/24/2018] [5:11/24/2018] [N/A:N/A] Weeks of Treatment: [4:9] [5:9] [N/A:N/A] Wound Status: [4:Healed - Epithelialized] [5:Open] [N/A:N/A] Measurements L x W x D 0x0x0 [5:1.5x1.1x0.1] [N/A:N/A] (cm) Area (cm) : [4:0] [5:1.296] [N/A:N/A] Volume (cm) : [4:0] [5:0.13] [N/A:N/A] % Reduction in Area: [4:100.00%] [5:86.40%] [N/A:N/A] % Reduction in Volume: 100.00% [5:86.40%] [N/A:N/A] Classification: [4:Full Thickness Without Exposed Support Structures Exposed Support Structures] [5:Full Thickness Without] [N/A:N/A] Exudate Amount: [4:None Present] [5:Medium] [N/A:N/A] Exudate Type: [4:N/A] [5:Serosanguineous] [N/A:N/A] Exudate Color: [4:N/A] [5:red, brown]  [N/A:N/A] Wound Margin: [4:Well defined, not attached Well defined, not attached N/A] Granulation Amount: [4:None Present (0%)] [5:Large (67-100%)] [N/A:N/A] Granulation Quality: [4:N/A] [5:Red] [N/A:N/A] Necrotic Amount: [4:None Present (0%)] [5:Small (1-33%)] [N/A:N/A] Exposed Structures: [4:Fascia: No Fat Layer (Subcutaneous Tissue) Exposed: Yes Tissue) Exposed: No Tendon: No Muscle: No Joint: No Bone: No] [5:Fat Layer (Subcutaneous N/A Fascia: No Tendon: No Muscle: No Joint: No Bone: No] Epithelialization: [4:Large (67-100%) N/A] [5:Medium (34-66%) Compression Therapy] [N/A:N/A N/A] Treatment Notes Electronic Signature(s) Signed: 03/14/2019 6:25:25 PM By: Linton Ham MD Signed: 03/14/2019 6:38:14 PM By: Deon Pilling Entered By: Linton Ham on 03/14/2019 15:40:22 -------------------------------------------------------------------------------- Multi-Disciplinary Care Plan Details Patient Name: Date of Service: SHAQUIELLE, GRANGER 03/14/2019 2:00 PM Medical Record [id number] Patient Account Number: [id number] Date of Birth/Sex: Treating RN: [birth year] (58 y.o. [name] Primary Care Ihsan Nomura: [employer] Other Clinician: Referring Floyed Masoud: Treating Braleigh Massoud/Extender:Robson, Gustavo Lah, Cynda Familia in Treatment: 9 Active Inactive Nutrition Nursing Diagnoses: Potential for alteratiion in Nutrition/Potential for imbalanced nutrition Goals: Patient/caregiver agrees to and verbalizes understanding of need to obtain nutritional consultation Date Initiated: 01/10/2019 Target Resolution Date: 03/22/2019 Goal Status: Active Interventions: Provide education on nutrition Treatment Activities: Education provided on Nutrition : 02/28/2019 Patient referred to Primary Care Physician for further nutritional evaluation : 01/10/2019 Notes: Wound/Skin Impairment Nursing Diagnoses: Knowledge deficit related to ulceration/compromised skin  integrity Goals: Patient/caregiver will verbalize understanding of skin care regimen Date Initiated: 01/10/2019 Target Resolution Date: 03/22/2019 Goal Status: Active Interventions: Assess patient/caregiver ability to obtain necessary supplies Assess patient/caregiver ability to perform ulcer/skin care regimen upon admission and as needed Provide education on ulcer and skin care Treatment Activities: Skin care regimen initiated : 01/10/2019 Topical wound management initiated : 01/10/2019 Notes: Electronic Signature(s) Signed: 03/14/2019 6:38:14 PM By: Deon Pilling Entered By: Deon Pilling on 03/14/2019 15:32:56 -------------------------------------------------------------------------------- Pain Assessment Details Patient Name: Date of Service: PHILLIPP, WILEY 03/14/2019 2:00 PM Medical Record [id number] Patient Account Number: [id number] Date of Birth/Sex: Treating RN: 02-01-1961 (58 y.o. [name] Primary Care Ugochi Henzler: [employer] Other Clinician: Referring Sahily Biddle: Treating Kymoni Lesperance/Extender:Robson, Gustavo Lah, Cynda Familia in Treatment: 9 Active Problems Location of Pain Severity and Description of Pain Patient Has Paino Yes Site Locations Pain Location: Pain in Ulcers With Dressing Change: Yes Duration of the Pain. Constant / Intermittento Intermittent Rate the pain. Current Pain Level: 3 Character of Pain Describe the Pain: Dull Pain Management and Medication Current Pain Management: Medication: Yes Cold Application: No Rest: No Massage: No Activity: No T.E.N.S.: No Heat Application: No Leg drop or elevation: No Is the Current Pain Management Adequate: Adequate How does your wound impact your activities of daily livingo Sleep: No Bathing: No Appetite: No Relationship With Others: No Bladder Continence: No Emotions: No Bowel Continence: No Work: No Toileting: No Drive: No Dressing: No Hobbies: No Electronic  Signature(s) Signed: 03/18/2019 2:18:43 PM By: [name] RN,  BSN Entered By: [name] on 03/14/2019 14:58:39 -------------------------------------------------------------------------------- Patient/Caregiver Education Details Patient Name: Date of Service: OSAMA, SIGLEY 11/19/2020andnbsp2:00 PM Medical Record Patient Account Number: [id number] Number: Treating RN: Deon Pilling Date of Birth/Gender: 1961/02/12 (58 y.o. Other Clinician: M) Treating Linton Ham Primary Care Physician: [employer] Physician/Extender: Referring Physician: Jeanell Sparrow in Treatment: 9 Education Assessment Education Provided To: Patient Education Topics Provided Nutrition: Handouts: Nutrition Methods: Explain/Verbal Responses: Reinforcements needed Electronic Signature(s) Signed: 03/14/2019 6:38:14 PM By: Deon Pilling Entered By: Deon Pilling on 03/14/2019 15:33:07 -------------------------------------------------------------------------------- Wound Assessment Details Patient Name: Date of Service: KRISTAIN, ZUNICH 03/14/2019 2:00 PM Medical Record [id number] Patient Account Number: [id number] Date of Birth/Sex: Treating RN: [birth year] (58 y.o. [name] Primary Care Nazier Neyhart: [employer] Other Clinician: Referring Regie Bunner: Treating Artelia Game/Extender:Robson, Gustavo Lah, Cynda Familia in Treatment: 9 Wound Status Wound Number: 4 Primary Etiology: Inflammatory Wound Location: Left Foot - Medial Wound Status: Healed - Epithelialized Wounding Event: Gradually Appeared Comorbid History: Rheumatoid Arthritis, Osteoarthritis Date Acquired: 11/24/2018 Weeks Of Treatment: 9 Clustered Wound: No Photos Wound Measurements Length: (cm) 0 % Reduct Width: (cm) 0 % Reduct Depth: (cm) 0 Epitheli Area: (cm) 0 Volume: (cm) 0 Wound Description Classification: Full Thickness Without Exposed Support Foul Odo Structures Slough/F Wound Well  defined, not attached Margin: Exudate Exudate None Present Amount: Wound Bed Granulation Amount: None Present (0%) Necrotic Amount: None Present (0%) Fascia Expo Fat Layer ( Tendon Expo Muscle Expo Joint Expos Bone Expose r After Cleansing: No ibrino No Exposed Structure sed: No Subcutaneous Tissue) Exposed: No sed: No sed: No ed: No d: No ion in Area: 100% ion in Volume: 100% alization: Large (67-100%) Electronic Signature(s) Signed: 03/18/2019 2:18:43 PM By: [name] RN, BSN Signed: 03/19/2019 7:37:04 AM By: Mikeal Hawthorne EMT/HBOT Entered By: Mikeal Hawthorne on 03/18/2019 13:45:53 -------------------------------------------------------------------------------- Wound Assessment Details Patient Name: Date of Service: CHRISTIEN, PETRAUSKAS 03/14/2019 2:00 PM Medical Record [id number] Patient Account Number: [id number] Date of Birth/Sex: Treating RN: 29-Oct-1960 (58 y.o. [name] Primary Care Vadim Centola: [employer] Other Clinician: Referring Stephanne Greeley: Treating Cleveland Paiz/Extender:Robson, Gustavo Lah, Cynda Familia in Treatment: 9 Wound Status Wound Number: 5 Primary Etiology: Inflammatory Wound Location: Left Foot - Dorsal Wound Status: Open Wounding Event: Gradually Appeared Comorbid History: Rheumatoid Arthritis, Osteoarthritis Date Acquired: 11/24/2018 Weeks Of Treatment: 9 Clustered Wound: No Photos Wound Measurements Length: (cm) 1.5 % Reduction Width: (cm) 1.1 % Reduction Depth: (cm) 0.1 Epitheliali Area: (cm) 1.296 Tunneling: Volume: (cm) 0.13 Underminin Wound Description Classification: Full Thickness Without Exposed Support Foul Odo Structures Slough/F Wound Well defined, not attached Margin: Exudate Medium Amount: Exudate Serosanguineous Type: Exudate red, brown Color: Wound Bed Granulation Amount: Large (67-100%) Granulation Quality: Red Fascia E Necrotic Amount: Small (1-33%) Fat Laye Necrotic Quality: Adherent Slough  Tendon E Muscle E Joint Ex Bone Exp r After Cleansing: No ibrino No Exposed Structure xposed: No r (Subcutaneous Tissue) Exposed: Yes xposed: No xposed: No posed: No osed: No in Area: 86.4% in Volume: 86.4% zation: Medium (34-66%) No g: No Treatment Notes Wound #5 (Left, Dorsal Foot) 1. Cleanse With Soap and water 2. Periwound Care Moisturizing lotion TCA Ointment 3. Primary Dressing Applied Collegen AG 4. Secondary Dressing ABD Pad Dry Gauze 6. Support Layer Applied 3 layer compression wrap Electronic Signature(s) Signed: 03/18/2019 2:18:43 PM By: [name] RN, BSN Signed: 03/19/2019 7:37:04 AM By: Mikeal Hawthorne EMT/HBOT Entered By: Mikeal Hawthorne on 03/18/2019 13:46:13 -------------------------------------------------------------------------------- [place] Details Patient Name: Date of Service: ANTONEY, BELEW 03/14/2019 2:00 PM Medical Record [id number] Patient Account  Number: [id number] Date of Birth/Sex: Treating RN: Sep 22, 1960 (58 y.o. [name] Primary Care Nhan Qualley: [employer] Other Clinician: Referring Reine Bristow: Treating Ayen Viviano/Extender:Robson, Gustavo Lah, Cynda Familia in Treatment: 9 Vital Signs Time Taken: 15:00 Temperature (F): 98.9 Height (in): 63 Pulse (bpm): 101 Weight (lbs): 162 Respiratory Rate (breaths/min): 18 Body Mass Index (BMI): 28.7 Blood Pressure (mmHg): 124/78 Reference Range: 80 - 120 mg / dl Electronic Signature(s) Signed: 03/18/2019 2:18:43 PM By: [name] RN, BSN Entered By: [name] on 03/14/2019 15:00:13

## 2019-03-20 DIAGNOSIS — D890 Polyclonal hypergammaglobulinemia: Secondary | ICD-10-CM | POA: Insufficient documentation

## 2019-03-28 ENCOUNTER — Encounter (HOSPITAL_BASED_OUTPATIENT_CLINIC_OR_DEPARTMENT_OTHER): Payer: Medicare Other | Admitting: Internal Medicine

## 2019-04-02 NOTE — Progress Notes (Signed)
Jonathan [name], Jonathan [name] ([id number]) Visit Report for 02/28/2019 Arrival Information Details Patient Name: Date of Service: Jonathan [name], Jonathan [name] 02/28/2019 2:00 PM Medical Record [id number] Patient Account Number: [id number] Date of Birth/Sex: Treating RN: [birth year] (58 y.o. [name] Primary Care Ameka Krigbaum: [employer] Other Clinician: Referring Meeah Totino: Treating Overton Boggus/Extender:Robson, Gustavo Lah, Cynda Familia in Treatment: 7 Visit Information History Since Last Visit Added or deleted any medications: No Patient Arrived: Crutches Any new allergies or adverse reactions: No Arrival Time: 14:06 Had a fall or experienced change in No Accompanied By: self activities of daily living that may affect Transfer Assistance: None risk of falls: Patient Identification Verified: Yes Signs or symptoms of abuse/neglect since last No Secondary Verification Process Yes visito Completed: Hospitalized since last visit: No Patient Requires Transmission- No Implantable device outside of the clinic excluding No Based Precautions: cellular tissue based products placed in the center Patient Has Alerts: Yes since last visit: Patient Alerts: Patient on Blood Has Dressing in Place as Prescribed: Yes Thinner Pain Present Now: No ABI Right .98 ABI Left 1.01 Electronic Signature(s) Signed: 04/02/2019 3:00:22 PM By: Sandre Kitty Entered By: Sandre Kitty on 02/28/2019 14:09:14 -------------------------------------------------------------------------------- Compression Therapy Details Patient Name: Date of Service: Jonathan [name], Jonathan [name] 02/28/2019 2:00 PM Medical Record [id number] Patient Account Number: [id number] Date of Birth/Sex: Treating RN: 1960/07/21 (58 y.o. [name] Primary Care [name]: [employer] Other Clinician: Referring [name]: Treating Zerrick Hanssen/Extender:Robson, Gustavo Lah, Cynda Familia in Treatment: 7 Compression Therapy Performed for Wound Wound  #4 Left,Medial Foot Assessment: Performed By: Clinician Baruch Gouty, RN Compression Type: Three Layer Post Procedure Diagnosis Same as Pre-procedure Electronic Signature(s) Signed: 02/28/2019 5:16:49 PM By: Kela Millin Entered By: Kela Millin on 02/28/2019 15:14:29 -------------------------------------------------------------------------------- Compression Therapy Details Patient Name: Date of Service: Jonathan [name], Jonathan [name] 02/28/2019 2:00 PM Medical Record [id number] Patient Account Number: [id number] Date of Birth/Sex: Treating RN: 01-May-1960 (58 y.o. [name] Primary Care [name]: [employer] Other Clinician: Referring [name]: Treating Chael Urenda/Extender:Robson, Gustavo Lah, Cynda Familia in Treatment: 7 Compression Therapy Performed for Wound Wound #5 Left,Dorsal Foot Assessment: Performed By: Clinician Baruch Gouty, RN Compression Type: Three Layer Post Procedure Diagnosis Same as Pre-procedure Electronic Signature(s) Signed: 02/28/2019 5:16:49 PM By: Kela Millin Entered By: Kela Millin on 02/28/2019 15:14:30 -------------------------------------------------------------------------------- Encounter Discharge Information Details Patient Name: Date of Service: Jonathan [name], Jonathan [name] 02/28/2019 2:00 PM Medical Record [id number] Patient Account Number: [id number] Date of Birth/Sex: Treating RN: 10-18-1960 (58 y.o. [name] Primary Care Jonavan Vanhorn: [employer] Other Clinician: Referring [name]: Treating Deshawn Skelley/Extender:Robson, Gustavo Lah, Cynda Familia in Treatment: 7 Encounter Discharge Information Items Discharge Condition: Stable Ambulatory Status: Crutches Discharge Destination: Home Transportation: Other Accompanied By: self Schedule Follow-up Appointment: Yes Clinical Summary of Care: Patient Declined Notes transportation service Electronic Signature(s) Signed: 02/28/2019 6:27:24 PM By: Baruch Gouty  RN, BSN Entered By: Baruch Gouty on 02/28/2019 15:42:02 -------------------------------------------------------------------------------- Lower Extremity Assessment Details Patient Name: Date of Service: Jonathan [name], Jonathan [name] 02/28/2019 2:00 PM Medical Record [id number] Patient Account Number: [id number] Date of Birth/Sex: Treating RN: May 22, 1960 (58 y.o. [name] Primary Care [name]: [employer] Other Clinician: Referring [name]: Treating Arnol Mcgibbon/Extender:Robson, Gustavo Lah, Cynda Familia in Treatment: 7 Edema Assessment Assessed: [Left: No] [Right: No] Edema: [Left: Yes] [Right: Yes] Calf Left: Right: Point of Measurement: 36 cm From Medial Instep 32 cm 31 cm Ankle Left: Right: Point of Measurement: 8 cm From Medial Instep 24 cm 24.5 cm Vascular Assessment Pulses: Dorsalis Pedis Palpable: [Left:Yes] Electronic Signature(s) Signed: 03/04/2019 5:46:53 PM By: [name] RN, BSN Entered By: [name] on 02/28/2019 14:30:31 --------------------------------------------------------------------------------  Multi Wound Chart Details Patient Name: Date of Service: Jonathan [name], Jonathan [name] 02/28/2019 2:00 PM Medical Record [id number] Patient Account Number: [id number] Date of Birth/Sex: Treating RN: 16-May-1960 (58 y.o. [name] Primary Care [name]: [employer] Other Clinician: Referring [name]: Treating [name]/Extender:Robson, Gustavo Lah, Cynda Familia in Treatment: 7 Vital Signs Height(in): 63 Pulse(bpm): 68 Weight(lbs): 162 Blood Pressure(mmHg): 129/67 Body Mass Index(BMI): 29 Temperature(F): 98.4 Respiratory 16 Rate(breaths/min): Photos: [4:No Photos] [5:No Photos] [6:No Photos] Wound Location: [4:Left Foot - Medial] [5:Left Foot - Dorsal] [6:Left Lower Leg - Anterior] Wounding Event: [4:Gradually Appeared] [5:Gradually Appeared] [6:Gradually Appeared] Primary Etiology: [4:Inflammatory] [5:Inflammatory] [6:Venous Leg  Ulcer] Comorbid History: [4:Rheumatoid Arthritis, Osteoarthritis] [5:Rheumatoid Arthritis, Osteoarthritis] [6:Rheumatoid Arthritis, Osteoarthritis] Date Acquired: [4:11/24/2018] [5:11/24/2018] [6:11/24/2018] Weeks of Treatment: [4:7] [5:7] [6:7] Wound Status: [4:Open] [5:Open] [6:Healed - Epithelialized] Measurements L x W x D 0.2x0.2x0.1 [5:1.6x1.1x0.1] [6:0x0x0] (cm) Area (cm) : [4:0.031] [5:1.382] [6:0] Volume (cm) : [4:0.003] [5:0.138] [6:0] % Reduction in Area: [4:99.40%] [5:85.50%] [6:100.00%] % Reduction in Volume: 99.40% [5:85.50%] [6:100.00%] Classification: [4:Full Thickness Without Exposed Support Structures Exposed Support Structures Exposed Support Structures] [5:Full Thickness Without] [6:Full Thickness Without] Exudate Amount: [4:Small] [5:Medium] [6:None Present] Exudate Type: [4:Serosanguineous] [5:Serosanguineous] [6:N/A] Exudate Color: [4:red, brown] [5:red, brown] [6:N/A] Wound Margin: [4:Well defined, not attached Well defined, not attached Flat and Intact] Granulation Amount: [4:Large (67-100%)] [5:Large (67-100%)] [6:None Present (0%)] Granulation Quality: [4:Red] [5:Red] [6:N/A] Necrotic Amount: [4:None Present (0%)] [5:None Present (0%)] [6:None Present (0%)] Exposed Structures: [4:Fat Layer (Subcutaneous Fat Layer (Subcutaneous Fascia: No Tissue) Exposed: Yes Fascia: No Tendon: No Muscle: No Joint: No Bone: No] [5:Tissue) Exposed: Yes Fascia: No Tendon: No Muscle: No Joint: No Bone: No] [6:Fat Layer (Subcutaneous Tissue)  Exposed: No Tendon: No Muscle: No Joint: No Bone: No] Epithelialization: [4:Large (67-100%)] [5:Medium (34-66%) Compression Therapy] [6:Large (67-100%) N/A] Treatment Notes Electronic Signature(s) Signed: 02/28/2019 5:30:28 PM By: Deon Pilling Signed: 02/28/2019 5:45:12 PM By: Linton Ham MD Entered By: Linton Ham on 02/28/2019 15:32:56 -------------------------------------------------------------------------------- Multi-Disciplinary Care  Plan Details Patient Name: Date of Service: Jonathan [name], Jonathan [name] 02/28/2019 2:00 PM Medical Record [id number] Patient Account Number: [id number] Date of Birth/Sex: Treating RN: 1960-05-15 (58 y.o. [name] Primary Care Kue Fox: [employer] Other Clinician: Referring Chelbi Herber: Treating Taura Lamarre/Extender:Robson, Gustavo Lah, Cynda Familia in Treatment: 7 Active Inactive Nutrition Nursing Diagnoses: Potential for alteratiion in Nutrition/Potential for imbalanced nutrition Goals: Patient/caregiver agrees to and verbalizes understanding of need to obtain nutritional consultation Date Initiated: 01/10/2019 Target Resolution Date: 03/22/2019 Goal Status: Active Interventions: Provide education on nutrition Treatment Activities: Education provided on Nutrition : 02/14/2019 Patient referred to Primary Care Physician for further nutritional evaluation : 01/10/2019 Notes: Wound/Skin Impairment Nursing Diagnoses: Knowledge deficit related to ulceration/compromised skin integrity Goals: Patient/caregiver will verbalize understanding of skin care regimen Date Initiated: 01/10/2019 Target Resolution Date: 03/22/2019 Goal Status: Active Interventions: Assess patient/caregiver ability to obtain necessary supplies Assess patient/caregiver ability to perform ulcer/skin care regimen upon admission and as needed Provide education on ulcer and skin care Treatment Activities: Skin care regimen initiated : 01/10/2019 Topical wound management initiated : 01/10/2019 Notes: Electronic Signature(s) Signed: 02/28/2019 5:16:49 PM By: Kela Millin Entered By: Kela Millin on 02/28/2019 14:15:11 -------------------------------------------------------------------------------- Pain Assessment Details Patient Name: Date of Service: Jonathan [name], Jonathan [name] 02/28/2019 2:00 PM Medical Record [id number] Patient Account Number: [id number] Date of Birth/Sex: Treating RN: 1961/01/07 (58  y.o. [name] Primary Care Avaleigh Decuir: [employer] Other Clinician: Referring Anmol Paschen: Treating Marionette Meskill/Extender:Robson, Gustavo Lah, Cynda Familia in Treatment: 7 Active Problems Location of Pain Severity and Description of Pain Patient  Has Paino No Site Locations Pain Management and Medication Current Pain Management: Electronic Signature(s) Signed: 02/28/2019 5:30:28 PM By: Deon Pilling Signed: 04/02/2019 3:00:22 PM By: Sandre Kitty Entered By: Sandre Kitty on 02/28/2019 14:09:33 -------------------------------------------------------------------------------- Patient/Caregiver Education Details Patient Name: Date of Service: Jonathan [name], Jonathan [name] 11/5/2020andnbsp2:00 PM Medical Record [id number] Patient Account Number: [id number] Date of Birth/Gender: Treating RN: Aug 08, 1960 (59 y.o. [name] Primary Care Physician: [employer] Other Clinician: Referring Physician: Treating Physician/Extender:Robson, Gustavo Lah, Cynda Familia in Treatment: 7 Education Assessment Education Provided To: Patient Education Topics Provided Nutrition: Handouts: Elevated Blood Sugars: How Do They Affect Wound Healing Methods: Explain/Verbal Responses: State content correctly Wound/Skin Impairment: Handouts: Caring for Your Ulcer Methods: Explain/Verbal Responses: State content correctly Electronic Signature(s) Signed: 02/28/2019 5:16:49 PM By: Kela Millin Entered By: Kela Millin on 02/28/2019 14:15:33 -------------------------------------------------------------------------------- Wound Assessment Details Patient Name: Date of Service: Jonathan [name], Jonathan [name] 02/28/2019 2:00 PM Medical Record [id number] Patient Account Number: [id number] Date of Birth/Sex: Treating RN: Sep 22, 1960 (58 y.o. [name] Primary Care Lenin Kuhnle: [employer] Other Clinician: Referring Sarika Baldini: Treating Xavia Kniskern/Extender:Robson, Gustavo Lah, Cynda Familia in  Treatment: 7 Wound Status Wound Number: 4 Primary Etiology: Inflammatory Wound Location: Left Foot - Medial Wound Status: Open Wounding Event: Gradually Appeared Comorbid History: Rheumatoid Arthritis, Osteoarthritis Date Acquired: 11/24/2018 Weeks Of Treatment: 7 Clustered Wound: No Photos Wound Measurements Length: (cm) 0.2 % Reduct Width: (cm) 0.2 % Reduct Depth: (cm) 0.1 Epitheli Area: (cm) 0.031 Tunneli Volume: (cm) 0.003 Undermi Wound Description Classification: Full Thickness Without Exposed Support Foul Odo Structures Slough/F Wound Well defined, not attached Margin: Exudate Small Amount: Exudate Serosanguineous Type: Exudate red, brown Color: Wound Bed Granulation Amount: Large (67-100%) Granulation Quality: Red Fascia E Necrotic Amount: None Present (0%) Fat Laye Tendon E Muscle E Joint Ex Bone Exp r After Cleansing: No ibrino No Exposed Structure xposed: No r (Subcutaneous Tissue) Exposed: Yes xposed: No xposed: No posed: No osed: No ion in Area: 99.4% ion in Volume: 99.4% alization: Large (67-100%) ng: No ning: No Electronic Signature(s) Signed: 03/04/2019 3:33:55 PM By: Deon Pilling Signed: 03/04/2019 4:06:15 PM By: Mikeal Hawthorne EMT/HBOT Previous Signature: 02/28/2019 5:30:28 PM Version By: Deon Pilling Entered By: Mikeal Hawthorne on 03/04/2019 09:21:39 -------------------------------------------------------------------------------- Wound Assessment Details Patient Name: Date of Service: Jonathan [name], Jonathan [name] 02/28/2019 2:00 PM Medical Record [id number] Patient Account Number: [id number] Date of Birth/Sex: Treating RN: 10-27-1960 (58 y.o. [name] Primary Care Kaydan Wong: [employer] Other Clinician: Referring Inza Mikrut: Treating Verlena Marlette/Extender:Robson, Gustavo Lah, Cynda Familia in Treatment: 7 Wound Status Wound Number: 5 Primary Etiology: Inflammatory Wound Location: Left Foot - Dorsal Wound Status: Open Wounding  Event: Gradually Appeared Comorbid History: Rheumatoid Arthritis, Osteoarthritis Date Acquired: 11/24/2018 Weeks Of Treatment: 7 Clustered Wound: No Photos Wound Measurements Length: (cm) 1.6 % Reduct Width: (cm) 1.1 % Reduct Depth: (cm) 0.1 Epitheli Area: (cm) 1.382 Tunneli Volume: (cm) 0.138 Undermi Wound Description Classification: Full Thickness Without Exposed Support Foul Odo Structures Slough/F Wound Well defined, not attached Margin: Exudate Medium Amount: Exudate Serosanguineous Type: Exudate red, brown Color: Wound Bed Granulation Amount: Large (67-100%) Granulation Quality: Red Fascia E Necrotic Amount: None Present (0%) Fat Laye Tendon E Muscle E Joint Ex Bone Exp r After Cleansing: No ibrino No Exposed Structure xposed: No r (Subcutaneous Tissue) Exposed: Yes xposed: No xposed: No posed: No osed: No ion in Area: 85.5% ion in Volume: 85.5% alization: Medium (34-66%) ng: No ning: No Electronic Signature(s) Signed: 03/04/2019 3:33:55 PM By: Deon Pilling Signed: 03/04/2019 4:06:15 PM By: Mikeal Hawthorne EMT/HBOT Previous Signature: 02/28/2019  5:30:28 PM Version By: Deon Pilling Entered By: Mikeal Hawthorne on 03/04/2019 09:22:03 -------------------------------------------------------------------------------- Wound Assessment Details Patient Name: Date of Service: Jonathan [name], CARDONI 02/28/2019 2:00 PM Medical Record [id number] Patient Account Number: [id number] Date of Birth/Sex: Treating RN: 1960-05-14 (58 y.o. [name] Primary Care Edmund Holcomb: [employer] Other Clinician: Referring Lawanda Holzheimer: Treating Melinna Linarez/Extender:Robson, Gustavo Lah, Cynda Familia in Treatment: 7 Wound Status Wound Number: 6 Primary Etiology: Venous Leg Ulcer Wound Location: Left Lower Leg - Anterior Wound Status: Healed - Epithelialized Wounding Event: Gradually Appeared Comorbid History: Rheumatoid Arthritis, Osteoarthritis Date Acquired: 11/24/2018 Weeks Of  Treatment: 7 Clustered Wound: No Photos Wound Measurements Length: (cm) 0 % Reduct Width: (cm) 0 % Reduct Depth: (cm) 0 Epitheli Area: (cm) 0 Tunneli Volume: (cm) 0 Undermi Wound Description Classification: Full Thickness Without Exposed Support Foul Odo Structures Slough/F Wound Flat and Intact Margin: Exudate None Present Amount: Wound Bed Granulation Amount: None Present (0%) Necrotic Amount: None Present (0%) Fascia E Fat Laye Tendon E Muscle E Joint Ex Bone Exp r After Cleansing: No ibrino No Exposed Structure xposed: No r (Subcutaneous Tissue) Exposed: No xposed: No xposed: No posed: No osed: No ion in Area: 100% ion in Volume: 100% alization: Large (67-100%) ng: No ning: No Electronic Signature(s) Signed: 03/04/2019 3:33:55 PM By: Deon Pilling Signed: 03/04/2019 4:06:15 PM By: Mikeal Hawthorne EMT/HBOT Previous Signature: 02/28/2019 5:30:28 PM Version By: Deon Pilling Entered By: Mikeal Hawthorne on 03/04/2019 09:21:19 -------------------------------------------------------------------------------- Vitals Details Patient Name: Date of Service: DASHIEL, WESSMAN 02/28/2019 2:00 PM Medical Record [id number] Patient Account Number: [id number] Date of Birth/Sex: Treating RN: Apr 25, 1961 (58 y.o. [name] Primary Care Finnigan Warriner: [employer] Other Clinician: Referring Loraine Bhullar: Treating Kemora Pinard/Extender:Robson, Gustavo Lah, Cynda Familia in Treatment: 7 Vital Signs Time Taken: 14:09 Temperature (F): 98.4 Height (in): 63 Pulse (bpm): 68 Weight (lbs): 162 Respiratory Rate (breaths/min): 16 Body Mass Index (BMI): 28.7 Blood Pressure (mmHg): 129/67 Reference Range: 80 - 120 mg / dl Electronic Signature(s) Signed: 04/02/2019 3:00:22 PM By: Sandre Kitty Entered By: Sandre Kitty on 02/28/2019 14:09:28

## 2019-04-02 NOTE — Progress Notes (Signed)
Jonathan [name] ([id number]) Visit Report for 02/14/2019 Arrival Information Details Patient Name: Date of Service: Jonathan [name], Jonathan [name] 02/14/2019 2:00 PM Medical Record [id number] Patient Account Number: [id number] Date of Birth/Sex: Treating RN: January 15, 1961 (58 y.o. Jonathan [name] Primary Care Nawaal Alling: [employer] Other Clinician: Referring Cleve Paolillo: Treating Deseri Loss/Extender:Robson, Gustavo Lah, Cynda Familia in Treatment: 5 Visit Information History Since Last Visit Added or deleted any medications: No Patient Arrived: Crutches Any new allergies or adverse reactions: No Arrival Time: 14:38 Had a fall or experienced change in No Accompanied By: self activities of daily living that may affect Transfer Assistance: None risk of falls: Patient Identification Verified: Yes Signs or symptoms of abuse/neglect since last No Secondary Verification Process Yes visito Completed: Hospitalized since last visit: No Patient Requires Transmission- No Implantable device outside of the clinic excluding No Based Precautions: cellular tissue based products placed in the center Patient Has Alerts: Yes since last visit: Patient Alerts: Patient on Blood Has Dressing in Place as Prescribed: Yes Thinner Pain Present Now: No ABI Right .98 ABI Left 1.01 Electronic Signature(s) Signed: 04/02/2019 3:02:15 PM By: Sandre Kitty Entered By: Sandre Kitty on 02/14/2019 14:38:55 -------------------------------------------------------------------------------- Compression Therapy Details Patient Name: Date of Service: Jonathan [name], Jonathan [name] 02/14/2019 2:00 PM Medical Record [id number] Patient Account Number: [id number] Date of Birth/Sex: Treating RN: 1960-07-04 (58 y.o. Jonathan [name] Primary Care Hale Chalfin: [employer] Other Clinician: Referring Parvin Stetzer: Treating Amjad Fikes/Extender:Robson, Gustavo Lah, Cynda Familia in Treatment: 5 Compression Therapy Performed for Wound Wound  #6 Left,Anterior Lower Leg Assessment: Performed By: Clinician Kela Millin, RN Compression Type: Three Layer Pre Treatment ABI: 1 Post Procedure Diagnosis Same as Pre-procedure Electronic Signature(s) Signed: 02/14/2019 6:35:09 PM By: Deon Pilling Entered By: Deon Pilling on 02/14/2019 15:18:27 -------------------------------------------------------------------------------- Compression Therapy Details Patient Name: Date of Service: Jonathan [name], Jonathan [name] 02/14/2019 2:00 PM Medical Record [id number] Patient Account Number: [id number] Date of Birth/Sex: Treating RN: [birth year] (58 y.o. Jonathan [name] Primary Care Kimmy Parish: [employer] Other Clinician: Referring Khoury Siemon: Treating Riham Polyakov/Extender:Robson, Gustavo Lah, Cynda Familia in Treatment: 5 Compression Therapy Performed for Wound Wound #4 Left,Medial Foot Assessment: Performed By: Clinician Kela Millin, RN Compression Type: Three Layer Pre Treatment ABI: 1 Post Procedure Diagnosis Same as Pre-procedure Electronic Signature(s) Signed: 02/14/2019 6:35:09 PM By: Deon Pilling Entered By: Deon Pilling on 02/14/2019 15:18:28 -------------------------------------------------------------------------------- Compression Therapy Details Patient Name: Date of Service: Jonathan [name] 02/14/2019 2:00 PM Medical Record [id number] Patient Account Number: [id number] Date of Birth/Sex: Treating RN: [birth year] (58 y.o. Jonathan [name] Primary Care Bryton Waight: [employer] Other Clinician: Referring Colter Magowan: Treating Ociel Retherford/Extender:Robson, Gustavo Lah, Cynda Familia in Treatment: 5 Compression Therapy Performed for Wound Wound #5 Left,Dorsal Foot Assessment: Performed By: Clinician Kela Millin, RN Compression Type: Three Layer Pre Treatment ABI: 1 Post Procedure Diagnosis Same as Pre-procedure Electronic Signature(s) Signed: 02/14/2019 6:35:09 PM By: Deon Pilling Entered By: Deon Pilling on  02/14/2019 15:18:28 -------------------------------------------------------------------------------- Encounter Discharge Information Details Patient Name: Date of Service: Jonathan [name] 02/14/2019 2:00 PM Medical Record [id number] Patient Account Number: [id number] Date of Birth/Sex: Treating RN: 11-Sep-1960 (58 y.o. [name] Primary Care Latham Kinzler: [employer] Other Clinician: Referring Mame Twombly: Treating Janina Trafton/Extender:Robson, Gustavo Lah, Cynda Familia in Treatment: 5 Encounter Discharge Information Items Discharge Condition: Stable Ambulatory Status: Crutches Discharge Destination: Home Transportation: Other Accompanied By: self Schedule Follow-up Appointment: Yes Clinical Summary of Care: Patient Declined Electronic Signature(s) Signed: 02/18/2019 5:21:50 PM By: Kela Millin Entered By: Kela Millin on 02/14/2019 15:52:34 -------------------------------------------------------------------------------- Lower Extremity Assessment Details Patient Name: Date of Service: Jonathan [name], Jonathan [name] 02/14/2019 2:00 PM Medical Record [id number]  Patient Account Number: [id number] Date of Birth/Sex: Treating RN: 1961/04/19 (58 y.o. Jonathan [name] Primary Care Jonathan [name]: [employer] Other Clinician: Referring Brailyn Killion: Treating Karen Kinnard/Extender:Robson, Gustavo Lah, LORI Weeks in Treatment: 5 Edema Assessment Assessed: [Left: Yes] [Right: Yes] Edema: [Left: Yes] [Right: Yes] Calf Left: Right: Point of Measurement: 36 cm From Medial Instep 32 cm 31 cm Ankle Left: Right: Point of Measurement: 8 cm From Medial Instep 24 cm 24.5 cm Electronic Signature(s) Signed: 02/14/2019 6:35:09 PM By: Deon Pilling Entered By: Deon Pilling on 02/14/2019 15:24:41 -------------------------------------------------------------------------------- Multi Wound Chart Details Patient Name: Date of Service: Jonathan [name], Jonathan [name] 02/14/2019 2:00 PM Medical Record  [id number] Patient Account Number: [id number] Date of Birth/Sex: Treating RN: 21-Jan-1961 (58 y.o. Jonathan [name] Primary Care Orval Dortch: [employer] Other Clinician: Referring Shemar Plemmons: Treating Khylee Algeo/Extender:Robson, Gustavo Lah, Cynda Familia in Treatment: 5 Vital Signs Height(in): 63 Pulse(bpm): 67 Weight(lbs): 162 Blood Pressure(mmHg): 107/64 Body Mass Index(BMI): 29 Temperature(F): 98.6 Respiratory 16 Rate(breaths/min): Photos: [4:No Photos] [5:No Photos] [6:No Photos] Wound Location: [4:Left Foot - Medial] [5:Left Foot - Dorsal] [6:Left Lower Leg - Anterior] Wounding Event: [4:Gradually Appeared] [5:Gradually Appeared] [6:Gradually Appeared] Primary Etiology: [4:Inflammatory] [5:Inflammatory] [6:Venous Leg Ulcer] Comorbid History: [4:Rheumatoid Arthritis, Osteoarthritis] [5:Rheumatoid Arthritis, Osteoarthritis] [6:Rheumatoid Arthritis, Osteoarthritis] Date Acquired: [4:11/24/2018] [5:11/24/2018] [6:11/24/2018] Weeks of Treatment: [4:5] [5:5] [6:5] Wound Status: [4:Open] [5:Open] [6:Open] Measurements L x W x D 0.8x0.9x0.1 [5:1.9x1.6x0.1] [6:0.3x0.3x0.1] (cm) Area (cm) : [4:0.565] [5:2.388] [6:0.071] Volume (cm) : [4:0.057] [5:0.239] [6:0.007] % Reduction in Area: [4:89.60%] [5:75.00%] [6:89.20%] % Reduction in Volume: 89.50% [5:75.00%] [6:89.40%] Classification: [4:Full Thickness Without Exposed Support Structures Exposed Support Structures Exposed Support Structures] [5:Full Thickness Without] [6:Full Thickness Without] Exudate Amount: [4:Medium] [5:Medium] [6:Small] Exudate Type: [4:Serosanguineous] [5:Serosanguineous] [6:Serosanguineous] Exudate Color: [4:red, brown] [5:red, brown] [6:red, brown] Wound Margin: [4:Well defined, not attached Well defined, not attached Flat and Intact] Granulation Amount: [4:Large (67-100%)] [5:Large (67-100%)] [6:Large (67-100%)] Granulation Quality: [4:Red] [5:Red] [6:Pink] Necrotic Amount: [4:None Present (0%)] [5:None Present  (0%)] [6:Small (1-33%)] Exposed Structures: [4:Fat Layer (Subcutaneous Fat Layer (Subcutaneous Fat Layer (Subcutaneous Tissue) Exposed: Yes Fascia: No Tendon: No Muscle: No Joint: No Bone: No] [5:Tissue) Exposed: Yes Fascia: No Tendon: No Muscle: No Joint: No Bone: No] Epithelialization: [4:Small (1-33%) Compression Therapy] [5:Small (1-33%) Compression Therapy] Treatment Notes Electronic Signature(s) Signed: 02/14/2019 6:29:04 PM By: Linton Ham MD Signed: 02/14/2019 6:35:09 PM By: Deon Pilling Entered By: Linton Ham on 02/14/2019 15:51:07 -------------------------------------------------------------------------------- Multi-Disciplinary Care Plan Details Patient Name: Date of Service: MATHER, KLADIS 02/14/2019 2:00 PM Medical Record [id number] Patient Account Number: [id number] Date of Birth/Sex: Treating RN: May 19, 1960 (58 y.o. Jonathan [name] Primary Care Magnum Lunde: [employer] Other Clinician: Referring Cherrill Scrima: Treating Lateefa Crosby/Extender:Robson, Gustavo Lah, Cynda Familia in Treatment: 5 Active Inactive Nutrition Nursing Diagnoses: Potential for alteratiion in Nutrition/Potential for imbalanced nutrition Goals: Patient/caregiver agrees to and verbalizes understanding of need to obtain nutritional consultation Date Initiated: 01/10/2019 Target Resolution Date: 03/15/2019 Goal Status: Active Interventions: Provide education on nutrition Treatment Activities: Education provided on Nutrition : 01/24/2019 Patient referred to Primary Care Physician for further nutritional evaluation : 01/10/2019 Notes: Wound/Skin Impairment Nursing Diagnoses: Knowledge deficit related to ulceration/compromised skin integrity Goals: Patient/caregiver will verbalize understanding of skin care regimen Date Initiated: 01/10/2019 Target Resolution Date: 03/15/2019 Goal Status: Active Interventions: Assess patient/caregiver ability to obtain necessary supplies Assess  patient/caregiver ability to perform ulcer/skin care regimen upon admission and as needed Provide education on ulcer and skin care Treatment Activities: Skin care regimen initiated : 01/10/2019 Topical wound management initiated : 01/10/2019 Notes: Electronic Signature(s) Signed: 02/14/2019  6:35:09 PM By: Deon Pilling Entered By: Deon Pilling on 02/14/2019 15:08:40 -------------------------------------------------------------------------------- Pain Assessment Details Patient Name: Date of Service: TRAESHAWN, ALBANY 02/14/2019 2:00 PM Medical Record [id number] Patient Account Number: [id number] Date of Birth/Sex: Treating RN: 29-Nov-1960 (58 y.o. Jonathan [name] Primary Care Leeandra Ellerson: [employer] Other Clinician: Referring Rocket Gunderson: Treating Kylyn Sookram/Extender:Robson, Gustavo Lah, Cynda Familia in Treatment: 5 Active Problems Location of Pain Severity and Description of Pain Patient Has Paino No Site Locations Pain Management and Medication Current Pain Management: Electronic Signature(s) Signed: 02/14/2019 6:35:09 PM By: Deon Pilling Signed: 04/02/2019 3:02:15 PM By: Sandre Kitty Entered By: Sandre Kitty on 02/14/2019 14:39:20 -------------------------------------------------------------------------------- Patient/Caregiver Education Details Patient Name: Date of Service: Jani Files 10/22/2020andnbsp2:00 PM Medical Record Patient Account Number: [id number] Number: Treating RN: Deon Pilling Date of Birth/Gender: 03-Jul-1960 (58 y.o. Other Clinician: M) Treating Linton Ham Primary Care Physician: [employer] Physician/Extender: Referring Physician: Jeanell Sparrow in Treatment: 5 Education Assessment Education Provided To: Patient Education Topics Provided Nutrition: Handouts: Nutrition Methods: Explain/Verbal, Printed Responses: Reinforcements needed Electronic Signature(s) Signed: 02/14/2019 6:35:09 PM By: Deon Pilling Entered By: Deon Pilling on 02/14/2019 15:08:58 -------------------------------------------------------------------------------- Wound Assessment Details Patient Name: Date of Service: FABION, JEMMOTT 02/14/2019 2:00 PM Medical Record [id number] Patient Account Number: [id number] Date of Birth/Sex: Treating RN: 1960/05/05 (58 y.o. Jonathan [name] Primary Care Shariyah Eland: [employer] Other Clinician: Referring Kaylan Friedmann: Treating [place] Merida/Extender:Robson, Gustavo Lah, Cynda Familia in Treatment: 5 Wound Status Wound Number: 4 Primary Etiology: Inflammatory Wound Location: Left Foot - Medial Wound Status: Open Wounding Event: Gradually Appeared Comorbid History: Rheumatoid Arthritis, Osteoarthritis Date Acquired: 11/24/2018 Weeks Of Treatment: 5 Clustered Wound: No Photos Wound Measurements Length: (cm) 0.8 % Reduct Width: (cm) 0.9 % Reduct Depth: (cm) 0.1 Epitheli Area: (cm) 0.565 Tunneli Volume: (cm) 0.057 Undermi Wound Description Classification: Full Thickness Without Exposed Support Foul Odo Structures Slough/F Wound Well defined, not attached Margin: Exudate Medium Amount: Exudate Serosanguineous Type: Exudate red, brown Color: Wound Bed Granulation Amount: Large (67-100%) Granulation Quality: Red Fascia E Necrotic Amount: None Present (0%) Fat Laye Tendon E Muscle E Joint Ex Bone Exp r After Cleansing: No ibrino No Exposed Structure xposed: No r (Subcutaneous Tissue) Exposed: Yes xposed: No xposed: No posed: No osed: No ion in Area: 89.6% ion in Volume: 89.5% alization: Small (1-33%) ng: No ning: No Electronic Signature(s) Signed: 02/19/2019 3:44:28 PM By: Mikeal Hawthorne EMT/HBOT Signed: 02/19/2019 5:02:12 PM By: Deon Pilling Previous Signature: 02/14/2019 6:35:09 PM Version By: Deon Pilling Entered By: Mikeal Hawthorne on 02/19/2019 09:32:28 -------------------------------------------------------------------------------- Wound  Assessment Details Patient Name: Date of Service: Jonathan [name], Jonathan [name] 02/14/2019 2:00 PM Medical Record [id number] Patient Account Number: [id number] Date of Birth/Sex: Treating RN: August 07, 1960 (58 y.o. Jonathan [name] Primary Care Kimyah Frein: Other Clinician: [employer] Referring Reya Aurich: Treating Zekiah Coen/Extender:Robson, Gustavo Lah, Cynda Familia in Treatment: 5 Wound Status Wound Number: 5 Primary Etiology: Inflammatory Wound Location: Left Foot - Dorsal Wound Status: Open Wounding Event: Gradually Appeared Comorbid History: Rheumatoid Arthritis, Osteoarthritis Date Acquired: 11/24/2018 Weeks Of Treatment: 5 Clustered Wound: No Photos Wound Measurements Length: (cm) 1.9 % Reduct Width: (cm) 1.6 % Reduct Depth: (cm) 0.1 Epitheli Area: (cm) 2.388 Tunneli Volume: (cm) 0.239 Undermi Wound Description Classification: Full Thickness Without Exposed Support Foul Odo Structures Slough/F Wound Well defined, not attached Margin: Exudate Medium Amount: Exudate Serosanguineous Type: Exudate red, brown Color: Wound Bed Granulation Amount: Large (67-100%) Granulation Quality: Red Fascia E Necrotic Amount: None Present (0%) Fat Laye Tendon E Muscle E Joint Ex Bone Exp r After Cleansing: No  ibrino No Exposed Structure xposed: No r (Subcutaneous Tissue) Exposed: Yes xposed: No xposed: No posed: No osed: No ion in Area: 75% ion in Volume: 75% alization: Small (1-33%) ng: No ning: No Electronic Signature(s) Signed: 02/19/2019 3:44:28 PM By: Mikeal Hawthorne EMT/HBOT Signed: 02/19/2019 5:02:12 PM By: Deon Pilling Previous Signature: 02/14/2019 6:35:09 PM Version By: Deon Pilling Entered By: Mikeal Hawthorne on 02/19/2019 09:32:56 -------------------------------------------------------------------------------- Wound Assessment Details Patient Name: Date of Service: Jonathan [name], Jonathan [name] 02/14/2019 2:00 PM Medical Record [id number] Patient Account Number:  [id number] Date of Birth/Sex: Treating RN: 12/12/1960 (58 y.o. Jonathan [name] Primary Care Makaiah Terwilliger: [employer] Other Clinician: Referring Ulyess Muto: Treating Laszlo Ellerby/Extender:Robson, Gustavo Lah, Cynda Familia in Treatment: 5 Wound Status Wound Number: 6 Primary Etiology: Venous Leg Ulcer Wound Location: Left Lower Leg - Anterior Wound Status: Open Wounding Event: Gradually Appeared Comorbid History: Rheumatoid Arthritis, Osteoarthritis Date Acquired: 11/24/2018 Weeks Of Treatment: 5 Clustered Wound: No Photos Wound Measurements Length: (cm) 0.3 % Reduct Width: (cm) 0.3 % Reduct Depth: (cm) 0.1 Epitheli Area: (cm) 0.071 Tunneli Volume: (cm) 0.007 Undermi Wound Description Classification: Full Thickness Without Exposed Support Foul Odo Structures Slough/F Wound Flat and Intact Margin: Exudate Small Amount: Exudate Serosanguineous Type: Exudate red, brown Color: Wound Bed Granulation Amount: Large (67-100%) Granulation Quality: Pink Fascia E Necrotic Amount: Small (1-33%) Fat Laye Necrotic Quality: Adherent Slough Tendon E Muscle E Joint Exp Bone Expo r After Cleansing: No ibrino Yes Exposed Structure xposed: No r (Subcutaneous Tissue) Exposed: Yes xposed: No xposed: No osed: No sed: No ion in Area: 89.2% ion in Volume: 89.4% alization: Large (67-100%) ng: No ning: No Electronic Signature(s) Signed: 02/19/2019 3:44:28 PM By: Mikeal Hawthorne EMT/HBOT Signed: 02/19/2019 5:02:12 PM By: Deon Pilling Previous Signature: 02/14/2019 6:35:09 PM Version By: Deon Pilling Entered By: Mikeal Hawthorne on 02/19/2019 09:31:26 -------------------------------------------------------------------------------- Vitals Details Patient Name: Date of Service: Jonathan [name], Jonathan [name] 02/14/2019 2:00 PM Medical Record [id number] Patient Account Number: [id number] Date of Birth/Sex: Treating RN: 02-23-1961 (58 y.o. Jonathan [name] Primary Care Damel Querry: [employer] Other Clinician: Referring Dorwin Fitzhenry: Treating Liylah Najarro/Extender:Robson, Gustavo Lah, Cynda Familia in Treatment: 5 Vital Signs Time Taken: 14:38 Temperature (F): 98.6 Height (in): 63 Pulse (bpm): 67 Weight (lbs): 162 Respiratory Rate (breaths/min): 16 Body Mass Index (BMI): 28.7 Blood Pressure (mmHg): 107/64 Reference Range: 80 - 120 mg / dl Electronic Signature(s) Signed: 04/02/2019 3:02:15 PM By: Sandre Kitty Entered By: Sandre Kitty on 02/14/2019 14:39:11

## 2019-04-03 ENCOUNTER — Other Ambulatory Visit: Payer: Self-pay

## 2019-04-03 ENCOUNTER — Ambulatory Visit (INDEPENDENT_AMBULATORY_CARE_PROVIDER_SITE_OTHER): Payer: Medicare Other | Admitting: Podiatry

## 2019-04-03 DIAGNOSIS — M2041 Other hammer toe(s) (acquired), right foot: Secondary | ICD-10-CM

## 2019-04-03 DIAGNOSIS — Z9889 Other specified postprocedural states: Secondary | ICD-10-CM

## 2019-04-03 DIAGNOSIS — M05771 Rheumatoid arthritis with rheumatoid factor of right ankle and foot without organ or systems involvement: Secondary | ICD-10-CM | POA: Diagnosis not present

## 2019-04-03 DIAGNOSIS — L97522 Non-pressure chronic ulcer of other part of left foot with fat layer exposed: Secondary | ICD-10-CM

## 2019-04-03 DIAGNOSIS — M2042 Other hammer toe(s) (acquired), left foot: Secondary | ICD-10-CM

## 2019-04-04 ENCOUNTER — Encounter (HOSPITAL_BASED_OUTPATIENT_CLINIC_OR_DEPARTMENT_OTHER): Payer: Medicare Other | Attending: Internal Medicine | Admitting: Internal Medicine

## 2019-04-04 DIAGNOSIS — L97311 Non-pressure chronic ulcer of right ankle limited to breakdown of skin: Secondary | ICD-10-CM | POA: Diagnosis not present

## 2019-04-04 DIAGNOSIS — L97521 Non-pressure chronic ulcer of other part of left foot limited to breakdown of skin: Secondary | ICD-10-CM | POA: Insufficient documentation

## 2019-04-04 DIAGNOSIS — L97821 Non-pressure chronic ulcer of other part of left lower leg limited to breakdown of skin: Secondary | ICD-10-CM | POA: Diagnosis not present

## 2019-04-04 DIAGNOSIS — M059 Rheumatoid arthritis with rheumatoid factor, unspecified: Secondary | ICD-10-CM | POA: Diagnosis not present

## 2019-04-04 DIAGNOSIS — L97211 Non-pressure chronic ulcer of right calf limited to breakdown of skin: Secondary | ICD-10-CM | POA: Diagnosis not present

## 2019-04-04 DIAGNOSIS — I87331 Chronic venous hypertension (idiopathic) with ulcer and inflammation of right lower extremity: Secondary | ICD-10-CM | POA: Diagnosis not present

## 2019-04-04 NOTE — Progress Notes (Signed)
CHASSE, GARINGER ([id number]) Visit Report for 04/04/2019 Debridement Details Patient Name: Date of Service: Jonathan [name], Jonathan [name] 04/04/2019 1:45 PM Medical Record [id number] Patient Account Number: [id number] Date of Birth/Sex: 21-May-1960 (58 y.o. M) Treating RN: Deon Pilling Primary Care Provider: Sinclair Ship Other Clinician: Referring Provider: Treating Provider/Extender:Kiarra Kidd, Gustavo Lah, Cynda Familia in Treatment: 12 Debridement Performed for Wound #10 Right,Medial Ankle Assessment: Performed By: Physician Ricard Dillon., MD Debridement Type: Debridement Level of Consciousness (Pre- Awake and Alert procedure): Pre-procedure Yes - 14:20 Verification/Time Out Taken: Start Time: 14:21 Pain Control: Lidocaine 4% Topical Solution Total Area Debrided (L x W): 1.8 (cm) x 1.3 (cm) = 2.34 (cm) Tissue and other material Viable, Non-Viable, Slough, Subcutaneous, Skin: Dermis , Fibrin/Exudate, Slough debrided: Level: Skin/Subcutaneous Tissue Debridement Description: Excisional Instrument: Curette Bleeding: Minimum Hemostasis Achieved: Pressure End Time: 14:25 Procedural Pain: 0 Post Procedural Pain: 0 Response to Treatment: Procedure was tolerated well Level of Consciousness Awake and Alert (Post-procedure): Post Debridement Measurements of Total Wound Length: (cm) 1.8 Width: (cm) 1.3 Depth: (cm) 0.2 Volume: (cm) 0.368 Character of Wound/Ulcer Post Improved Debridement: Post Procedure Diagnosis Same as Pre-procedure Electronic Signature(s) Signed: 04/04/2019 4:44:35 PM By: Linton Ham MD Signed: 04/04/2019 5:17:33 PM By: Deon Pilling Entered By: Linton Ham on 04/04/2019 14:30:48 -------------------------------------------------------------------------------- HPI Details Patient Name: Date of Service: Jonathan [name], Jonathan [name] 04/04/2019 1:45 PM Medical Record [id number] Patient Account Number: [id number] Date of Birth/Sex: Treating  RN: [birth year] (58 y.o. [name] Primary Care Provider: Sinclair Ship Other Clinician: Referring Provider: Treating Provider/Extender:Laryssa Hassing, Gustavo Lah, Cynda Familia in Treatment: 12 History of Present Illness HPI Description: ADMISSION 01/10/2019 This is a 58 year old man who has rheumatoid arthritis on Remicade. He is not a diabetic. He had forefoot surgery by Dr. Bettye Boeck on June 25 for correction of hammertoes. At some point in the postop follow-up he presented with multiple wounds on the left foot. I do not really have a good description at this point but I will try to look through [facility] health link. He had arterial studies on 11/28/2018 that were within normal limits. Seen by podiatry on 8/24 with multiple left foot and lower extremity ulcers. He was treated with Bactrim and Cipro. He was hospitalized from 8/28 through 8/30 with wounds on the left anterior foot. An MRI was negative at that point the wounds were on the left anterior and left medial foot. Felt to have cellulitis. He saw his primary doctor in follow-up on 9/3 and felt to have pressure ulcers on the foot. The patient states that he developed these after some acute swelling last month. This may have been a result of infection. He comes in today with multiple wounds on his toes dorsal feet and distal lower extremities anteriorly. The exact cause of this is not completely clear. He does not have a prior wound history. He has multiple lower extremity wounds including the left medial foot, left dorsal foot, left second toe, left anterior mid tibia, right dorsal foot, right fourth toe medially and the right medial lower leg. The areas on the left medial foot and left dorsal foot are large superficial wounds most of the rest of these are small punched out looking areas. He does not complain of a lot of pain Past medical history; hypertension, rheumatoid arthritis on Remicade infusions, hyperlipidemia right forefoot  surgery on 6/25, he is on Xarelto at this point for reasons that are not totally clear. Arterial studies on 11/28/2018 showed an ABI of 0.98 on the right 1.01 on the left. TBI's of 0.79  on the right 0.75 on the left and triphasic waveforms bilaterally 9/24; wounds on his bilateral lower feet and lower legs. We have been using silver collagen on the wounds. 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 Follows with Dr. Raliegh Ip" rheumatology at [facility]. 10/1; patient with wounds on his bilateral lower feet and lower legs we have been using silver collagen. 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 He does not have macrovascular disease by previous noninvasive studies in August. 10/8; patient with wounds on his bilateral lower feet and legs. Small punched out areas. I have no doubt he has chronic venous disease however these wounds do not look like classic venous ulcers. He is generally been making good improvements. 10/15; patients with small wounds on his bilateral lower legs. He has nothing left on the right 3 wounds on the left are making good progress. On the left we have the left medial foot, left anterior tibial area and the larger area on the dorsal left foot in close proximity to the second and third toes. All of these look a lot better. The patient has clear evidence of chronic venous insufficiency although these wounds did not look like venous insufficiency wounds 10/22; patient has no wounds left on the right. He has 3 wounds on the left arm making good progress. Left medial foot left anterior tibia and the left dorsal foot. These are likely related to venous insufficiency. He has dark fibrotic adherent skin in his bilateral ankle areas. 11/5; still no open  wounds on the right. Left medial foot and the left dorsal foot just proximal to the toes of the 2 remaining wounds. The area on the left anterior tibia is healed 11/19; 2-week follow-up. The area on the left medial foot is closed. The only remaining wound is on the left dorsal foot just proximal to the toes. The area on the left anterior tibia also had been previously healed. The patient sees his rheumatologist in early December. I asked him to mention the wounds to the rheumatologist. I had some thoughts about this being rheumatoid vasculitic related and was going to biopsy this although he seems to have done nicely with conservative treatment. The patient has his compression stocking 12/10; 3-week follow-up. He was seen by our nurses in the interim. The only thing he had last time he was here was the opening on the dorsal foot just proximal to the toes. He was wearing a stocking on the right leg. He says in the last week he developed open areas on the medial ankle and medial calf on the right. Electronic Signature(s) Signed: 04/04/2019 4:44:35 PM By: Linton Ham MD Entered By: Linton Ham on 04/04/2019 14:32:09 -------------------------------------------------------------------------------- Physical Exam Details Patient Name: Date of Service: Jonathan [name], Jonathan [name] 04/04/2019 1:45 PM Medical Record [id number] Patient Account Number: [id number] Date of Birth/Sex: Treating RN: 25-Nov-1960 (58 y.o. [name] Primary Care Provider: Sinclair Ship Other Clinician: Referring Provider: Treating Provider/Extender:Kari Montero, Gustavo Lah, Cynda Familia in Treatment: 12 Constitutional Sitting or standing Blood Pressure is within target range for patient.. Pulse regular and within target range for patient.Marland Kitchen Respirations regular, non-labored and within target range.. Temperature is normal and within the target range for the patient.Marland Kitchen Appears in no distress. Cardiovascular Peripheral  pulses are palpable. Integumentary (Hair, Skin) . Notes Wound exam; the  patient's left anterior tibia and left medial foot remain healed however he has developed 3 openings on the right on the right distal medial calf and the medial ankle. These are full-thickness wounds but limited to breakdown the skin. There is no evidence of surrounding infection. He appears to have severe chronic venous insufficiency but no other issues. Peripheral pulses are palpable Using a #3 curette I cleaned up the area on the right medial ankle of tightly adherent fibrinous debris Electronic Signature(s) Signed: 04/04/2019 4:44:35 PM By: Linton Ham MD Entered By: Linton Ham on 04/04/2019 14:34:59 -------------------------------------------------------------------------------- Physician Orders Details Patient Name: Date of Service: Jonathan [name], Jonathan [name] 04/04/2019 1:45 PM Medical Record [id number] Patient Account Number: [id number] Date of Birth/Sex: Treating RN: 1961/04/03 (58 y.o. [name] Primary Care Provider: Sinclair Ship Other Clinician: Referring Provider: Treating Provider/Extender:Khamiyah Grefe, Gustavo Lah, Cynda Familia in Treatment: 12 Verbal / Phone Orders: No Diagnosis Coding ICD-10 Coding Code Description L97.521 Non-pressure chronic ulcer of other part of left foot limited to breakdown of skin L97.821 Non-pressure chronic ulcer of other part of left lower leg limited to breakdown of skin I87.331 Chronic venous hypertension (idiopathic) with ulcer and inflammation of right lower extremity M05.80 Other rheumatoid arthritis with rheumatoid factor of unspecified site Follow-up Appointments Return Appointment in 1 week. - Thursday Dressing Change Frequency Change dressing three times week. - twice a week by home health. Skin Barriers/Peri-Wound Care TCA Cream or Ointment - liberally to both legs, feet, toes, and all wounds in clinic and at home by home health. Wound  Cleansing Wound #5 Left,Dorsal Foot Clean wound with Wound Cleanser - or normal saline Primary Wound Dressing Wound #10 Right,Medial Ankle Silver Collagen - moisten with saline or hydrogel. Wound #5 Left,Dorsal Foot Silver Collagen - moisten with saline or hydrogel. Wound #9 Right,Medial Lower Leg Silver Collagen - moisten with saline or hydrogel. Secondary Dressing Dry Gauze Edema Control 3 Layer Compression System - Bilateral Avoid standing for long periods of time Elevate legs to the level of the heart or above for 30 minutes daily and/or when sitting, a frequency of: - throughout the day. [facility] skilled nursing for wound care. - [facility]. Electronic Signature(s) Signed: 04/04/2019 4:44:35 PM By: Linton Ham MD Signed: 04/04/2019 5:17:33 PM By: Deon Pilling Entered By: Deon Pilling on 04/04/2019 14:26:17 -------------------------------------------------------------------------------- Problem List Details Patient Name: Date of Service: Jonathan [name], Jonathan [name] 04/04/2019 1:45 PM Medical Record [id number] Patient Account Number: [id number] Date of Birth/Sex: Treating RN: May 04, 1960 (58 y.o. [name] Primary Care Provider: Sinclair Ship Other Clinician: Referring Provider: Treating Provider/Extender:Jayzen Paver, Gustavo Lah, Cynda Familia in Treatment: 12 Active Problems ICD-10 Evaluated Encounter Code Description Active Date Today Diagnosis L97.521 Non-pressure chronic ulcer of other part of left foot 01/10/2019 No Yes limited to breakdown of skin L97.821 Non-pressure chronic ulcer of other part of left lower 01/10/2019 No Yes leg limited to breakdown of skin I87.331 Chronic venous hypertension (idiopathic) with ulcer 01/10/2019 No Yes and inflammation of right lower extremity M05.80 Other rheumatoid arthritis with rheumatoid factor of 01/10/2019 No Yes unspecified site L97.211 Non-pressure chronic ulcer of right calf limited to  04/04/2019 No Yes breakdown of skin L97.311 Non-pressure chronic ulcer of right ankle limited to 04/04/2019 No Yes breakdown of skin Inactive Problems ICD-10 Code Description Active Date Inactive Date L97.511 Non-pressure chronic ulcer of other part of right foot limited to 01/10/2019 01/10/2019 breakdown of skin L97.812 Non-pressure chronic ulcer of other part of right lower leg with 01/10/2019 01/10/2019 fat layer exposed  Resolved Problems Electronic Signature(s) Signed: 04/04/2019 4:44:35 PM By: Linton Ham MD Entered By: Linton Ham on 04/04/2019 14:30:06 -------------------------------------------------------------------------------- Progress Note Details Patient Name: Date of Service: Jonathan [name], Jonathan [name] 04/04/2019 1:45 PM Medical Record [id number] Patient Account Number: [id number] Date of Birth/Sex: Treating RN: 23-Dec-1960 (58 y.o. [name] Primary Care Provider: Sinclair Ship Other Clinician: Referring Provider: Treating Provider/Extender:Kristofer Schaffert, Gustavo Lah, Cynda Familia in Treatment: 12 Subjective History of Present Illness (HPI) ADMISSION 01/10/2019 This is a 58 year old man who has rheumatoid arthritis on Remicade. He is not a diabetic. He had forefoot surgery by Dr. Bettye Boeck on June 25 for correction of hammertoes. At some point in the postop follow-up he presented with multiple wounds on the left foot. I do not really have a good description at this point but I will try to look through [facility] health link. He had arterial studies on 11/28/2018 that were within normal limits. Seen by podiatry on 8/24 with multiple left foot and lower extremity ulcers. He was treated with Bactrim and Cipro. He was hospitalized from 8/28 through 8/30 with wounds on the left anterior foot. An MRI was negative at that point the wounds were on the left anterior and left medial foot. Felt to have cellulitis. He saw his primary doctor in follow-up on 9/3 and felt to  have pressure ulcers on the foot. The patient states that he developed these after some acute swelling last month. This may have been a result of infection. He comes in today with multiple wounds on his toes dorsal feet and distal lower extremities anteriorly. The exact cause of this is not completely clear. He does not have a prior wound history. He has multiple lower extremity wounds including the left medial foot, left dorsal foot, left second toe, left anterior mid tibia, right dorsal foot, right fourth toe medially and the right medial lower leg. The areas on the left medial foot and left dorsal foot are large superficial wounds most of the rest of these are small punched out looking areas. He does not complain of a lot of pain Past medical history; hypertension, rheumatoid arthritis on Remicade infusions, hyperlipidemia right forefoot surgery on 6/25, he is on Xarelto at this point for reasons that are not totally clear. Arterial studies on 11/28/2018 showed an ABI of 0.98 on the right 1.01 on the left. TBI's of 0.79 on the right 0.75 on the left and triphasic waveforms bilaterally 9/24; wounds on his bilateral lower feet and lower legs. We have been using silver collagen on the wounds. 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 Follows with Dr. Raliegh Ip" rheumatology at [facility]. 10/1; patient with wounds on his bilateral lower feet and lower legs we have been using silver collagen. 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 He does not have macrovascular disease by previous noninvasive studies in August. 10/8; patient with wounds on his bilateral lower feet and legs. Small punched out areas. I have no doubt he has chronic venous disease however these wounds do not  look like classic venous ulcers. He is generally been making good improvements. 10/15; patients with small wounds on his bilateral lower legs. He has nothing left on the right 3 wounds on the left are making good progress. On the left we have the left medial foot, left anterior tibial area  and the larger area on the dorsal left foot in close proximity to the second and third toes. All of these look a lot better. The patient has clear evidence of chronic venous insufficiency although these wounds did not look like venous insufficiency wounds 10/22; patient has no wounds left on the right. He has 3 wounds on the left arm making good progress. Left medial foot left anterior tibia and the left dorsal foot. These are likely related to venous insufficiency. He has dark fibrotic adherent skin in his bilateral ankle areas. 11/5; still no open wounds on the right. Left medial foot and the left dorsal foot just proximal to the toes of the 2 remaining wounds. The area on the left anterior tibia is healed 11/19; 2-week follow-up. The area on the left medial foot is closed. The only remaining wound is on the left dorsal foot just proximal to the toes. The area on the left anterior tibia also had been previously healed. The patient sees his rheumatologist in early December. I asked him to mention the wounds to the rheumatologist. I had some thoughts about this being rheumatoid vasculitic related and was going to biopsy this although he seems to have done nicely with conservative treatment. The patient has his compression stocking 12/10; 3-week follow-up. He was seen by our nurses in the interim. The only thing he had last time he was here was the opening on the dorsal foot just proximal to the toes. He was wearing a stocking on the right leg. He says in the last week he developed open areas on the medial ankle and medial calf on the right. Objective Constitutional Sitting or standing Blood Pressure is within  target range for patient.. Pulse regular and within target range for patient.Marland Kitchen Respirations regular, non-labored and within target range.. Temperature is normal and within the target range for the patient.Marland Kitchen Appears in no distress. Vitals Time Taken: 1:55 PM, Height: 63 in, Weight: 162 lbs, BMI: 28.7, Temperature: 98.3 F, Pulse: 78 bpm, Respiratory Rate: 17 breaths/min, Blood Pressure: 127/72 mmHg. Cardiovascular Peripheral pulses are palpable. General Notes: Wound exam; the patient's left anterior tibia and left medial foot remain healed however he has developed 3 openings on the right on the right distal medial calf and the medial ankle. These are full-thickness wounds but limited to breakdown the skin. There is no evidence of surrounding infection. He appears to have severe chronic venous insufficiency but no other issues. Peripheral pulses are palpable ooUsing a #3 curette I cleaned up the area on the right medial ankle of tightly adherent fibrinous debris Integumentary (Hair, Skin) Wound #10 status is Open. Original cause of wound was Gradually Appeared. The wound is located on the Right,Medial Ankle. The wound measures 1.8cm length x 1.3cm width x 0.1cm depth; 1.838cm^2 area and 0.184cm^3 volume. There is Fat Layer (Subcutaneous Tissue) Exposed exposed. There is no tunneling or undermining noted. There is a small amount of serosanguineous drainage noted. The wound margin is distinct with the outline attached to the wound base. There is medium (34-66%) red, pink granulation within the wound bed. There is a medium (34-66%) amount of necrotic tissue within the wound bed including Adherent Slough. Wound #5 status is Open. Original cause of wound was Gradually Appeared. The wound is located on the Left,Dorsal Foot. The wound measures 1.3cm length x 1.1cm width x 0.1cm depth; 1.123cm^2 area and 0.112cm^3 volume. There is Fat Layer (Subcutaneous Tissue) Exposed exposed. There is no tunneling or  undermining noted. There is a  small amount of serosanguineous drainage noted. The wound margin is distinct with the outline attached to the wound base. There is large (67-100%) red granulation within the wound bed. There is a small (1-33%) amount of necrotic tissue within the wound bed including Adherent Slough. Wound #9 status is Open. Original cause of wound was Gradually Appeared. The wound is located on the Right,Medial Lower Leg. The wound measures 1cm length x 1.2cm width x 0.1cm depth; 0.942cm^2 area and 0.094cm^3 volume. There is Fat Layer (Subcutaneous Tissue) Exposed exposed. There is no tunneling or undermining noted. There is a small amount of serosanguineous drainage noted. The wound margin is distinct with the outline attached to the wound base. There is medium (34-66%) red, pink granulation within the wound bed. There is a medium (34-66%) amount of necrotic tissue within the wound bed including Adherent Slough. 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 There was a Excisional Skin/Subcutaneous Tissue Debridement with a total area of 2.34 sq cm performed by Ricard Dillon., MD. With the following instrument(s): Curette to remove Viable and Non-Viable tissue/material. Material removed includes Subcutaneous Tissue, Slough, Skin: Dermis, and Fibrin/Exudate after achieving pain control using Lidocaine 4%  Topical Solution. A time out was conducted at 14:20, prior to the start of the procedure. A Minimum amount of bleeding was controlled with Pressure. The procedure was tolerated well with a pain level of 0 throughout and a pain level of 0 following the procedure. Post Debridement Measurements: 1.8cm length x 1.3cm width x 0.2cm depth; 0.368cm^3 volume. Character of Wound/Ulcer Post Debridement is improved. Post procedure Diagnosis Wound #10: Same as Pre-Procedure Pre-procedure diagnosis of Wound #10 is an Inflammatory located on the Right,Medial Ankle . There was a Three Layer Compression Therapy Procedure with a pre-treatment ABI of 1 by Carlene Coria, RN. Post procedure Diagnosis Wound #10: Same as Pre-Procedure Wound #5 Pre-procedure diagnosis of Wound #5 is an Inflammatory located on the Left,Dorsal Foot . There was a Three Layer Compression Therapy Procedure with a pre-treatment ABI of 1 by Carlene Coria, RN. Post procedure Diagnosis Wound #5: Same as Pre-Procedure Wound #9 Pre-procedure diagnosis of Wound #9 is an Inflammatory located on the Right,Medial Lower Leg . There was a Three Layer Compression Therapy Procedure with a pre-treatment ABI of 1 by Carlene Coria, RN. Post procedure Diagnosis Wound #9: Same as Pre-Procedure Plan Follow-up Appointments: Return Appointment in 1 week. - Thursday Dressing Change Frequency: Change dressing three times week. - twice a week by home health. Skin Barriers/Peri-Wound Care: TCA Cream or Ointment - liberally to both legs, feet, toes, and all wounds in clinic and at home by home health. Wound Cleansing: Wound #5 Left,Dorsal Foot: Clean wound with Wound Cleanser - or normal saline Primary Wound Dressing: Wound #10 Right,Medial Ankle: Silver Collagen - moisten with saline or hydrogel. Wound #5 Left,Dorsal Foot: Silver Collagen - moisten with saline or hydrogel. Wound #9 Right,Medial Lower Leg: Silver Collagen - moisten with saline or  hydrogel. Secondary Dressing: Dry Gauze Edema Control:  3 Layer Compression System - Bilateral Avoid standing for long periods of time Elevate legs to the level of the heart or above for 30 minutes daily and/or when sitting, a frequency of: - throughout the day. Home Health: [facility] for wound care. - [facility]. 1. We applied silver collagen to all remaining wound areas 2. The area on the left dorsal foot appears smaller. 3. We have had reopening on the right medial ankle and right medial calf.. 3 layer compression bilaterally 4. He was says he was called by his rheumatologist to report that he has some form of "infection". I am not really sure who his rheumatologist is. He apparently went for his infusion last week and Electronic Signature(s) Signed: 04/04/2019 4:44:35 PM By: Linton Ham MD Entered By: Linton Ham on 04/04/2019 14:36:12 -------------------------------------------------------------------------------- SuperBill Details Patient Name: Date of Service: Jonathan [name], Jonathan [name] 04/04/2019 Medical Record [id number] Patient Account Number: [id number] Date of Birth/Sex: Treating RN: 1961/01/09 (58 y.o. [name] Primary Care Provider: [facility] Ship Other Clinician: Referring Provider: Treating Provider/Extender:Richel Millspaugh, Gustavo Lah, Cynda Familia in Treatment: 12 Diagnosis Coding ICD-10 Codes Code Description [phone] Non-pressure chronic ulcer of other part of left foot limited to breakdown of skin L97.821 Non-pressure chronic ulcer of other part of left lower leg limited to breakdown of skin I87.331 Chronic venous hypertension (idiopathic) with ulcer and inflammation of right lower extremity M05.80 Other rheumatoid arthritis with rheumatoid factor of unspecified site Facility Procedures The patient participates with Medicare or their insurance follows the Medicare Facility Guidelines: CPT4 Description Modifier Quantity  Code [id number] - DEB SUBQ TISSUE 20 SQ CM/< 1 ICD-10 Diagnosis Description I87.331 Chronic venous hypertension  (idiopathic) with ulcer and inflammation of right lower extremity The patient participates with Medicare or their insurance follows the Medicare Facility Guidelines: [id number] (Facility Use Only) 29581LT - [place] LT LEG 59 1 Physician Procedures CPT4: Code [id number] 11 Description: [id number] - WC PHYS SUBQ TISS 20 SQ CM ICD-10 Diagnosis Description I87.331 Chronic venous hypertension (idiopathic) with ulcer and inf extremity Modifier: lammation of ri Quantity: 1 ght lower Electronic Signature(s) Signed: 04/04/2019 4:44:35 PM By: Linton Ham MD Entered By: Linton Ham on 04/04/2019 14:36:26

## 2019-04-07 NOTE — Progress Notes (Signed)
   Subjective:  Patient presents today status post right forefoot reconstruction surgery. DOS: 10/18/2018. He states he is doing well. He denies any pain or modifying factors. Home health continues to change his dressings with the last change less than one week ago. Patient is here for further evaluation and treatment.   Past Medical History:  Diagnosis Date  . Anemia    H/o using iron in the past   . Arthritis    rheumatoid  . Chronic back pain   . Hyperlipidemia   . Joint pain   . Joint swelling   . Rheumatoid arthritis(714.0)       Objective/Physical Exam Neurovascular status intact.  Significantly improved ulceration to the fourth digit of the surgical foot right.  The focal area of dehiscence overlying the third toe dorsal incision has healed.  Complete reepithelialization has occurred. Vascular status appears to be intact.  I am able to palpate the posterior tibial and dorsalis pedis arteries to the surgical extremity.  The skin is cool to touch however.  Multiple ulcerations noted to the left lower extremity, nonsurgical extremity.  Most concerning ulcer is to the dorsal aspect of the left forefoot measuring approximately 2.0 x 2.0 x 0.2 cm.  Somewhat unchanged since last visit to the noted ulceration there is a moderate amount of fibrotic tissue noted.  Mild malodor noted.  There is no exposed bone muscle tendon ligament or joint.  Moderate amount of serosanguineous drainage noted.  Periwound integrity is intact.   Wound #1 noted to the right ankle measuring approximately 1.0 x 1.0 x 0.2 cm.   Wound #2 noted to the left dorsal measuring approximately 1.0 x 1.0 x 0.2 cm.   To the above-noted ulceration, there is no eschar. There is a moderate amount of slough, fibrin and necrotic tissue. Granulation tissue and wound base is red. There is no malodor. There is a minimal amount of serosanginous drainage noted. Periwound integrity is intact.   Assessment: 1. s/p right forefoot  reconstruction surgery. DOS: 10/18/2018 2.  Multiple ulcerations left lower extremity   Plan of Care:  1. Patient was evaluated.  2. Dressing changed.  3. From a surgical standpoint, may resume full activity with no restrictions.  4. Continue wound care from [facility].  5. Explained that we cannot do left foot surgery until wounds have healed.  6. Return to clinic as needed.    Edrick Kins, DPM [facility]  Dr. Edrick Kins, [address]                                        [place], [place] [address]                Office [phone]  Fax [phone]
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
## 2019-04-11 ENCOUNTER — Encounter (HOSPITAL_BASED_OUTPATIENT_CLINIC_OR_DEPARTMENT_OTHER): Payer: Medicare Other | Admitting: Internal Medicine

## 2019-04-12 ENCOUNTER — Telehealth: Payer: Self-pay | Admitting: *Deleted

## 2019-04-12 NOTE — Telephone Encounter (Signed)
I spoke pt and he states he is still in the stabilizing boot. I told pt that he should wear extra socks and cover the foot with a plastic bag, then put on the boot so the outer boot would not get wet, then remove the plastic bag once he is home and out of the rainy, snowy weather. Pt states he will wear one of the white socks from the wound care center, because other socks cause his feet to swell. I told pt that would be fine.

## 2019-04-12 NOTE — Telephone Encounter (Signed)
Pt asked what kind of shoes to wear to keep his feet from getting cold and wet when it rains.

## 2019-05-02 ENCOUNTER — Encounter (HOSPITAL_BASED_OUTPATIENT_CLINIC_OR_DEPARTMENT_OTHER): Payer: Medicare Other | Attending: Internal Medicine | Admitting: Internal Medicine

## 2019-05-02 ENCOUNTER — Other Ambulatory Visit: Payer: Self-pay

## 2019-05-02 DIAGNOSIS — L97311 Non-pressure chronic ulcer of right ankle limited to breakdown of skin: Secondary | ICD-10-CM | POA: Diagnosis not present

## 2019-05-02 DIAGNOSIS — M0580 Other rheumatoid arthritis with rheumatoid factor of unspecified site: Secondary | ICD-10-CM | POA: Insufficient documentation

## 2019-05-02 DIAGNOSIS — E785 Hyperlipidemia, unspecified: Secondary | ICD-10-CM | POA: Diagnosis not present

## 2019-05-02 DIAGNOSIS — I87331 Chronic venous hypertension (idiopathic) with ulcer and inflammation of right lower extremity: Secondary | ICD-10-CM | POA: Diagnosis not present

## 2019-05-02 DIAGNOSIS — L97211 Non-pressure chronic ulcer of right calf limited to breakdown of skin: Secondary | ICD-10-CM | POA: Diagnosis not present

## 2019-05-02 DIAGNOSIS — I10 Essential (primary) hypertension: Secondary | ICD-10-CM | POA: Diagnosis not present

## 2019-05-02 DIAGNOSIS — L97521 Non-pressure chronic ulcer of other part of left foot limited to breakdown of skin: Secondary | ICD-10-CM | POA: Diagnosis present

## 2019-05-02 DIAGNOSIS — I872 Venous insufficiency (chronic) (peripheral): Secondary | ICD-10-CM | POA: Diagnosis not present

## 2019-05-02 DIAGNOSIS — L97821 Non-pressure chronic ulcer of other part of left lower leg limited to breakdown of skin: Secondary | ICD-10-CM | POA: Diagnosis not present

## 2019-05-02 DIAGNOSIS — Z7901 Long term (current) use of anticoagulants: Secondary | ICD-10-CM | POA: Insufficient documentation

## 2019-05-03 NOTE — Progress Notes (Addendum)
Jonathan [name], Jonathan [name] ([id number]) Visit Report for 05/02/2019 Arrival Information Details Patient Name: Date of Service: Jonathan [name], Jonathan [name] 05/02/2019 12:30 PM Medical Record [id number] Patient Account Number: [id number] Date of Birth/Sex: Treating RN: Aug 30, 1960 (58 y.o. [name] Primary Care Estes Lehner: [employer] Other Clinician: Referring Tiara Bartoli: Treating Dodie Parisi/Extender:Robson, Gustavo Lah, Cynda Familia in Treatment: 16 Visit Information History Since Last Visit Added or deleted any medications: No Patient Arrived: Crutches Any new allergies or adverse reactions: No Arrival Time: 13:00 Had a fall or experienced change in No Accompanied By: self activities of daily living that may affect Transfer Assistance: None risk of falls: Patient Identification Verified: Yes Signs or symptoms of abuse/neglect since last No Secondary Verification Process Yes visito Completed: Hospitalized since last visit: No Patient Requires Transmission- No Implantable device outside of the clinic excluding No Based Precautions: cellular tissue based products placed in the center Patient Has Alerts: Yes since last visit: Patient Alerts: Patient on Blood Has Dressing in Place as Prescribed: Yes Thinner Has Compression in Place as Prescribed: Yes ABI Right .98 Pain Present Now: No ABI Left 1.01 Electronic Signature(s) Signed: 05/02/2019 5:21:35 PM By: Kela Millin Entered By: Kela Millin on 05/02/2019 13:00:40 -------------------------------------------------------------------------------- Compression Therapy Details Patient Name: Date of Service: Jonathan [name], Jonathan [name] 05/02/2019 12:30 PM Medical Record [id number] Patient Account Number: [id number] Date of Birth/Sex: Treating RN: June 05, 1960 (59 y.o. [name] Primary Care Quinlynn Cuthbert: [employer] Other Clinician: Referring Virgilene Stryker: Treating Phyllis Abelson/Extender:Robson, Gustavo Lah, Cynda Familia in Treatment:  16 Compression Therapy Performed for Wound Wound #10 Right,Medial Ankle Assessment: Performed By: Clinician Baruch Gouty, RN Compression Type: Three Layer Post Procedure Diagnosis Same as Pre-procedure Electronic Signature(s) Signed: 05/02/2019 5:19:45 PM By: Deon Pilling Entered By: Deon Pilling on 05/02/2019 13:21:02 -------------------------------------------------------------------------------- Compression Therapy Details Patient Name: Date of Service: Jonathan [name], Jonathan [name] 05/02/2019 12:30 PM Medical Record [id number] Patient Account Number: [id number] Date of Birth/Sex: Treating RN: Dec 30, 1960 (59 y.o. [name] Primary Care [name]: [employer] Other Clinician: Referring Antwian Santaana: Treating Samanda Buske/Extender:Robson, Gustavo Lah, Cynda Familia in Treatment: 16 Compression Therapy Performed for Wound Wound #5 Left,Dorsal Foot Assessment: Performed By: Clinician Baruch Gouty, RN Compression Type: Three Layer Post Procedure Diagnosis Same as Pre-procedure Electronic Signature(s) Signed: 05/02/2019 5:19:45 PM By: Deon Pilling Entered By: Deon Pilling on 05/02/2019 13:21:02 -------------------------------------------------------------------------------- Compression Therapy Details Patient Name: Date of Service: Jonathan [name], Jonathan [name] 05/02/2019 12:30 PM Medical Record [id number] Patient Account Number: [id number] Date of Birth/Sex: Treating RN: 1960/11/29 (59 y.o. [name] Primary Care Krithika Tome: [employer] Other Clinician: Referring Wilna Pennie: Treating Hiroyuki Ozanich/Extender:Robson, Gustavo Lah, Cynda Familia in Treatment: 16 Compression Therapy Performed for Wound Wound #9 Right,Medial Lower Leg Assessment: Performed By: Clinician Baruch Gouty, RN Compression Type: Three Layer Post Procedure Diagnosis Same as Pre-procedure Electronic Signature(s) Signed: 05/02/2019 5:19:45 PM By: Deon Pilling Entered By: Deon Pilling on 05/02/2019  13:21:02 -------------------------------------------------------------------------------- Encounter Discharge Information Details Patient Name: Date of Service: Jonathan [name], Jonathan [name] 05/02/2019 12:30 PM Medical Record [id number] Patient Account Number: [id number] Date of Birth/Sex: Treating RN: 22-Jul-1960 (59 y.o. [name] Primary Care Tamica Covell: [employer] Other Clinician: Referring Noell Shular: Treating Edan Serratore/Extender:Robson, Gustavo Lah, Cynda Familia in Treatment: 16 Encounter Discharge Information Items Discharge Condition: Stable Ambulatory Status: Crutches Discharge Destination: Home Transportation: Private Auto Accompanied By: self Schedule Follow-up Appointment: Yes Clinical Summary of Care: Patient Declined Electronic Signature(s) Signed: 05/02/2019 5:27:53 PM By: Baruch Gouty RN, BSN Entered By: Baruch Gouty on 05/02/2019 13:43:31 -------------------------------------------------------------------------------- Lower Extremity Assessment Details Patient Name: Date of Service: Jonathan [name], Jonathan [name] 05/02/2019 12:30 PM Medical Record [id number] Patient Account  Number: [id number] Date of Birth/Sex: Treating RN: Jun 02, 1960 (59 y.o. [name] Primary Care Damyra Luscher: [employer] Other Clinician: Referring Guliana Weyandt: Treating Onna Nodal/Extender:Robson, Gustavo Lah, Cynda Familia in Treatment: 16 Edema Assessment Assessed: [Left: No] [Right: No] Edema: [Left: Yes] [Right: Yes] Calf Left: Right: Point of Measurement: 36 cm From Medial Instep 28.4 cm 30 cm Ankle Left: Right: Point of Measurement: 8 cm From Medial Instep 22.5 cm 23 cm Vascular Assessment Pulses: Dorsalis Pedis Palpable: [Left:Yes] [Right:Yes] Electronic Signature(s) Signed: 05/02/2019 5:21:35 PM By: Kela Millin Entered By: Kela Millin on 05/02/2019 13:01:45 -------------------------------------------------------------------------------- Multi Wound Chart  Details Patient Name: Date of Service: Jonathan [name], Jonathan [name] 05/02/2019 12:30 PM Medical Record [id number] Patient Account Number: [id number] Date of Birth/Sex: Treating RN: 08-May-1960 (59 y.o. M) Primary Care Margaux Engen: [employer] Other Clinician: Referring Emerald Shor: Treating Cam Dauphin/Extender:Robson, Gustavo Lah, LORI Weeks in Treatment: 16 Vital Signs Height(in): 63 Pulse(bpm): 90 Weight(lbs): 162 Blood Pressure(mmHg): 126/88 Body Mass Index(BMI): 29 Temperature(F): 98.6 Respiratory 19 Rate(breaths/min): Photos: [10:No Photos] [11:No Photos] [5:No Photos] Wound Location: [10:Right Ankle - Medial] [11:Right Toe Third] [5:Left Foot - Dorsal] Wounding Event: [10:Gradually Appeared] [11:Gradually Appeared] [5:Gradually Appeared] Primary Etiology: [10:Inflammatory] [11:Inflammatory] [5:Inflammatory] Comorbid History: [10:Rheumatoid Arthritis, Osteoarthritis] [11:Rheumatoid Arthritis, Osteoarthritis] [5:Rheumatoid Arthritis, Osteoarthritis] Date Acquired: [10:03/28/2019] [11:05/01/2019] [5:11/24/2018] Weeks of Treatment: [10:4] [11:0] [5:16] Wound Status: [10:Open] [11:Open] [5:Open] Measurements L x W x D 1.6x1.1x0.2 [11:1x1x0.1] [5:2.5x1.9x0.1] (cm) Area (cm) : [10:1.382] [11:0.785] [5:3.731] Volume (cm) : [10:0.276] [11:0.079] [5:0.373] % Reduction in Area: [10:24.80%] [11:N/A] [5:60.90%] % Reduction in Volume: -50.00% [11:N/A] [5:60.90%] Classification: [10:Full Thickness Without Exposed Support Structures Exposed Support Structures Exposed Support Structures] [11:Full Thickness Without] [5:Full Thickness Without] Exudate Amount: [10:Small] [11:Small] [5:Small] Exudate Type: [10:Serosanguineous] [11:Serosanguineous] [5:Serosanguineous] Exudate Color: [10:red, brown] [11:red, brown] [5:red, brown] Wound Margin: [10:Distinct, outline attached] [11:Distinct, outline attached] [5:Distinct, outline attached] Granulation Amount: [10:Medium (34-66%)] [11:Large (67-100%)] [5:Medium  (34-66%)] Granulation Quality: [10:Red, Pink] [11:Red, Pink] [5:Red] Necrotic Amount: [10:Medium (34-66%)] [11:None Present (0%)] [5:Medium (34-66%)] Exposed Structures: [10:Fat Layer (Subcutaneous Tissue) Exposed: Yes Fascia: No Tendon: No Muscle: No Joint: No Bone: No] [11:Fat Layer (Subcutaneous Tissue) Exposed: Yes Fascia: No Tendon: No Muscle: No Joint: No Bone: No] [5:Fat Layer (Subcutaneous Tissue) Exposed: Yes  Fascia: No Tendon: No Muscle: No Joint: No Bone: No] Epithelialization: [10:None] [11:None] [5:Medium (34-66%)] Procedures Performed: [10:Compression Therapy] [11:N/A 9] [5:Compression Therapy N/A] Photos: [10:No Photos] [11:N/A] [5:N/A] Wound Location: [10:Right Lower Leg - Medial N/A] [5:N/A] Wounding Event: [10:Gradually Appeared] [11:N/A] [5:N/A] Primary Etiology: [10:Inflammatory] [11:N/A] [5:N/A] Comorbid History: [10:Rheumatoid Arthritis, Osteoarthritis] [11:N/A] [5:N/A] Date Acquired: [10:03/28/2019] [11:N/A] [5:N/A] Weeks of Treatment: [10:4] [11:N/A] [5:N/A] Wound Status: [10:Open] [11:N/A] [5:N/A] Measurements L x W x D 1x1x0.1 [11:N/A] [5:N/A] (cm) Area (cm) : [10:0.785] [11:N/A] [5:N/A] Volume (cm) : [10:0.079] [11:N/A] [5:N/A] % Reduction in Area: [10:16.70%] [11:N/A] [5:N/A] % Reduction in Volume: 16.00% [11:N/A] [5:N/A] Classification: [10:Full Thickness Without Exposed Support Structures] [11:N/A] [5:N/A] Exudate Amount: [10:Small] [11:N/A] [5:N/A] Exudate Type: [10:Serosanguineous] [11:N/A] [5:N/A] Exudate Color: [10:red, brown] [11:N/A] [5:N/A] Wound Margin: [10:Distinct, outline attached N/A] [5:N/A] Granulation Amount: [10:Large (67-100%)] [11:N/A] [5:N/A] Granulation Quality: [10:Red, Pink] [11:N/A] [5:N/A] Necrotic Amount: [10:Small (1-33%)] [11:N/A] [5:N/A] Exposed Structures: [10:Fat Layer (Subcutaneous N/A Tissue) Exposed: Yes Fascia: No Tendon: No Muscle: No Joint: No Bone: No] [5:N/A] Epithelialization: [10:None] [11:N/A N/A] [5:N/A  N/A] Treatment Notes Electronic Signature(s) Signed: 05/03/2019 4:55:01 AM By: Linton Ham MD Entered By: Linton Ham on 05/02/2019 13:28:20 -------------------------------------------------------------------------------- Pain Assessment Details Patient Name: Date of Service: Jonathan [name], Jonathan [name] 05/02/2019 12:30 PM Medical Record [id number] Patient Account  Number: [id number] Date of Birth/Sex: Treating RN: 20-Sep-1960 (59 y.o. [name] Primary Care Jakari Jacot: [employer] Other Clinician: Referring Tina Gruner: Treating Gaines Cartmell/Extender:Robson, Gustavo Lah, Cynda Familia in Treatment: 16 Active Problems Location of Pain Severity and Description of Pain Patient Has Paino No Site Locations Pain Management and Medication Current Pain Management: Electronic Signature(s) Signed: 05/02/2019 5:21:35 PM By: Kela Millin Entered By: Kela Millin on 05/02/2019 13:01:08 -------------------------------------------------------------------------------- Wound Assessment Details Patient Name: Date of Service: Jonathan [name], Jonathan [name] 05/02/2019 12:30 PM Medical Record [id number] Patient Account Number: [id number] Date of Birth/Sex: Treating RN: 20-Mar-1961 (59 y.o. [name] Primary Care Rashaan Wyles: [employer] Other Clinician: Referring Armya Westerhoff: Treating Madhav Mohon/Extender:Robson, Gustavo Lah, Cynda Familia in Treatment: 16 Wound Status Wound Number: 10 Primary Etiology: Inflammatory Wound Location: Right Ankle - Medial Wound Status: Open Wounding Event: Gradually Appeared Comorbid History: Rheumatoid Arthritis, Osteoarthritis Date Acquired: 03/28/2019 Weeks Of Treatment: 4 Clustered Wound: No Photos Wound Measurements Length: (cm) 1.6 % Reduct Width: (cm) 1.1 % Reduct Depth: (cm) 0.2 Epitheli Area: (cm) 1.382 Tunneli Volume: (cm) 0.276 Undermi Wound Description Full Thickness Without Exposed Support Foul Odo Classification:  Structures Slough/F Wound Distinct, outline attached Margin: Exudate Small Amount: Exudate Serosanguineous Type: Exudate red, brown Color: Wound Bed Granulation Amount: Medium (34-66%) Granulation Quality: Red, Pink Fascia E Necrotic Amount: Medium (34-66%) Fat Laye Necrotic Quality: Adherent Slough Tendon E Muscle E Joint Ex Bone Exp r After Cleansing: No ibrino Yes Exposed Structure xposed: No r (Subcutaneous Tissue) Exposed: Yes xposed: No xposed: No posed: No osed: No ion in Area: 24.8% ion in Volume: -50% alization: None ng: No ning: No Treatment Notes Wound #10 (Right, Medial Ankle) 2. Periwound Care TCA Cream 3. Primary Dressing Applied Hydrofera Blue 4. Secondary Dressing Dry Gauze 6. Support Layer Applied 3 layer compression wrap Electronic Signature(s) Signed: 05/06/2019 4:37:37 PM By: Mikeal Hawthorne EMT/HBOT Signed: 05/06/2019 5:57:16 PM By: Kela Millin Previous Signature: 05/02/2019 5:21:35 PM Version By: Kela Millin Entered By: Mikeal Hawthorne on 05/06/2019 11:03:31 -------------------------------------------------------------------------------- Wound Assessment Details Patient Name: Date of Service: Jonathan [name], Jonathan [name] 05/02/2019 12:30 PM Medical Record [id number] Patient Account Number: [id number] Date of Birth/Sex: Treating RN: 07/06/1960 (59 y.o. [name] Primary Care Gennie Eisinger: [employer] Other Clinician: Referring Faydra Korman: Treating Kiondre Grenz/Extender:Robson, Gustavo Lah, Cynda Familia in Treatment: 16 Wound Status Wound Number: 11 Primary Etiology: Inflammatory Wound Location: Right Toe Third Wound Status: Open Wounding Event: Gradually Appeared Comorbid History: Rheumatoid Arthritis, Osteoarthritis Date Acquired: 05/01/2019 Weeks Of Treatment: 0 Clustered Wound: No Photos Wound Measurements Length: (cm) 1 % Reduct Width: (cm) 1 % Reduct Depth: (cm) 0.1 Epitheli Area: (cm) 0.785 Tunneli Volume: (cm)  0.079 Undermi Wound Description Full Thickness Without Exposed Support Foul Odo Classification: Structures Slough/F Wound Distinct, outline attached Margin: Exudate Small Amount: Exudate Serosanguineous Type: Exudate red, brown Color: Wound Bed Granulation Amount: Large (67-100%) Granulation Quality: Red, Pink Fascia E Necrotic Amount: None Present (0%) Fat Laye Tendon Ex Muscle Ex Joint Exp Bone Expo r After Cleansing: No ibrino No Exposed Structure xposed: No r (Subcutaneous Tissue) Exposed: Yes posed: No posed: No osed: No sed: No ion in Area: 0% ion in Volume: 0% alization: None ng: No ning: No Treatment Notes Wound #11 (Right Toe Third) 2. Periwound Care TCA Cream 3. Primary Dressing Applied Hydrofera Blue 4. Secondary Dressing Dry Gauze 6. Support Layer Applied 3 layer compression wrap Electronic Signature(s) Signed: 05/06/2019 4:37:37 PM By: Mikeal Hawthorne EMT/HBOT Signed: 05/06/2019 5:57:16 PM By: Kela Millin Previous Signature: 05/02/2019 5:21:35 PM Version By: Kela Millin Entered  ByMikeal Hawthorne on 05/06/2019 11:03:51 -------------------------------------------------------------------------------- Wound Assessment Details Patient Name: Date of Service: Jonathan [name], Jonathan [name] 05/02/2019 12:30 PM Medical Record [id number] Patient Account Number: [id number] Date of Birth/Sex: Treating RN: 14-Jun-1960 (59 y.o. [name] Primary Care Jamario Colina: [employer] Other Clinician: Referring Kainan Patty: Treating Yeiren Whitecotton/Extender:Robson, Gustavo Lah, Cynda Familia in Treatment: 16 Wound Status Wound Number: 5 Primary Etiology: Inflammatory Wound Location: Left Foot - Dorsal Wound Status: Open Wounding Event: Gradually Appeared Comorbid History: Rheumatoid Arthritis, Osteoarthritis Date Acquired: 11/24/2018 Weeks Of Treatment: 16 Clustered Wound: No Photos Wound Measurements Length: (cm) 2.5 % Reductio Width: (cm) 1.9 %  Reduct Depth: (cm) 0.1 Epitheli Area: (cm) 3.731 Tunneli Volume: (cm) 0.373 Undermi Wound Description Classification: Full Thickness Without Exposed Support Foul Odo Structures Slough/F Wound Distinct, outline attached Margin: Exudate Small Amount: Exudate Serosanguineous Type: Exudate red, brown Color: Wound Bed Granulation Amount: Medium (34-66%) Granulation Quality: Red Fascia E Necrotic Amount: Medium (34-66%) Fat Laye Necrotic Quality: Adherent Slough Tendon E Muscle E Joint Ex Bone Exp r After Cleansing: No ibrino Yes Exposed Structure xposed: No r (Subcutaneous Tissue) Exposed: Yes xposed: No xposed: No posed: No osed: No n in Area: 60.9% ion in Volume: 60.9% alization: Medium (34-66%) ng: No ning: No Treatment Notes Wound #5 (Left, Dorsal Foot) 2. Periwound Care TCA Cream 3. Primary Dressing Applied Hydrofera Blue 4. Secondary Dressing Dry Gauze 6. Support Layer Applied 3 layer compression wrap Electronic Signature(s) Signed: 05/06/2019 4:37:37 PM By: Mikeal Hawthorne EMT/HBOT Signed: 05/06/2019 5:57:16 PM By: Kela Millin Previous Signature: 05/02/2019 5:21:35 PM Version By: Kela Millin Entered By: Mikeal Hawthorne on 05/06/2019 11:01:04 -------------------------------------------------------------------------------- Wound Assessment Details Patient Name: Date of Service: ANVAY, ARAKELIAN 05/02/2019 12:30 PM Medical Record [id number] Patient Account Number: [id number] Date of Birth/Sex: Treating RN: [birth year] (59 y.o. [name] Primary Care Ariauna Farabee: [employer] Other Clinician: Referring Tayron Hunnell: Treating Vadie Principato/Extender:Robson, Gustavo Lah, Cynda Familia in Treatment: 16 Wound Status Wound Number: 9 Primary Etiology: Inflammatory Wound Location: Right Lower Leg - Medial Wound Status: Open Wounding Event: Gradually Appeared Comorbid History: Rheumatoid Arthritis, Osteoarthritis Date Acquired: 03/28/2019 Weeks  Of Treatment: 4 Clustered Wound: No Photos Wound Measurements Length: (cm) 1 % Reduct Width: (cm) 1 % Reduct Depth: (cm) 0.1 Epitheli Area: (cm) 0.785 Tunneli Volume: (cm) 0.079 Undermi Wound Description Classification: Full Thickness Without Exposed Support Foul Odo Structures Slough/F Wound Distinct, outline attached Margin: Exudate Small Amount: Exudate Serosanguineous Type: Exudate red, brown Color: Wound Bed Granulation Amount: Large (67-100%) Granulation Quality: Red, Pink Fascia E Necrotic Amount: Small (1-33%) Fat Laye Necrotic Quality: Adherent Slough Tendon E Muscle E Joint Ex Bone Exp r After Cleansing: No ibrino Yes Exposed Structure xposed: No r (Subcutaneous Tissue) Exposed: Yes xposed: No xposed: No posed: No osed: No ion in Area: 16.7% ion in Volume: 16% alization: None ng: No ning: No Treatment Notes Wound #9 (Right, Medial Lower Leg) 2. Periwound Care TCA Cream 3. Primary Dressing Applied Hydrofera Blue 4. Secondary Dressing Dry Gauze 6. Support Layer Applied 3 layer compression wrap Electronic Signature(s) Signed: 05/06/2019 4:37:37 PM By: Mikeal Hawthorne EMT/HBOT Signed: 05/06/2019 5:57:16 PM By: Kela Millin Previous Signature: 05/02/2019 5:21:35 PM Version By: Kela Millin Entered By: Mikeal Hawthorne on 05/06/2019 11:01:27 -------------------------------------------------------------------------------- Vitals Details Patient Name: Date of Service: PRINCEMICHAEL, GRAVIER 05/02/2019 12:30 PM Medical Record [id number] Patient Account Number: [id number] Date of Birth/Sex: Treating RN: 1960-12-10 (59 y.o. [name] Primary Care Ashna Dorough: [employer] Other Clinician: Referring Everlie Eble: Treating Rhya Shan/Extender:Robson, Gustavo Lah, Cynda Familia in Treatment: 16 Vital Signs Time  Taken: 13:00 Temperature (F): 98.6 Height (in): 63 Pulse (bpm): 90 Weight (lbs): 162 Respiratory Rate (breaths/min): 19 Body  Mass Index (BMI): 28.7 Blood Pressure (mmHg): 126/88 Reference Range: 80 - 120 mg / dl Electronic Signature(s) Signed: 05/02/2019 5:21:35 PM By: Kela Millin Entered By: Kela Millin on 05/02/2019 13:01:02

## 2019-05-03 NOTE — Progress Notes (Signed)
DARREK, ROHLIK ([id number]) Visit Report for 05/02/2019 HPI Details Patient Name: Date of Service: Jonathan [name], Jonathan [name] 05/02/2019 12:30 PM Medical Record [id number] Patient Account Number: [id number] Date of Birth/Sex: Treating RN: 1961/03/22 (59 y.o. M) Primary Care Provider: Sinclair Ship Other Clinician: Referring Provider: Treating Provider/Extender:Paxtyn Wisdom, Gustavo Lah, Cynda Familia in Treatment: 16 History of Present Illness HPI Description: ADMISSION 01/10/2019 This is a 59 year old man who has rheumatoid arthritis on Remicade. He is not a diabetic. He had forefoot surgery by Dr. Bettye Boeck on June 25 for correction of hammertoes. At some point in the postop follow-up he presented with multiple wounds on the left foot. I do not really have a good description at this point but I will try to look through [facility] health link. He had arterial studies on 11/28/2018 that were within normal limits. Seen by podiatry on 8/24 with multiple left foot and lower extremity ulcers. He was treated with Bactrim and Cipro. He was hospitalized from 8/28 through 8/30 with wounds on the left anterior foot. An MRI was negative at that point the wounds were on the left anterior and left medial foot. Felt to have cellulitis. He saw his primary doctor in follow-up on 9/3 and felt to have pressure ulcers on the foot. The patient states that he developed these after some acute swelling last month. This may have been a result of infection. He comes in today with multiple wounds on his toes dorsal feet and distal lower extremities anteriorly. The exact cause of this is not completely clear. He does not have a prior wound history. He has multiple lower extremity wounds including the left medial foot, left dorsal foot, left second toe, left anterior mid tibia, right dorsal foot, right fourth toe medially and the right medial lower leg. The areas on the left medial foot and left dorsal foot are large superficial  wounds most of the rest of these are small punched out looking areas. He does not complain of a lot of pain Past medical history; hypertension, rheumatoid arthritis on Remicade infusions, hyperlipidemia right forefoot surgery on 6/25, he is on Xarelto at this point for reasons that are not totally clear. Arterial studies on 11/28/2018 showed an ABI of 0.98 on the right 1.01 on the left. TBI's of 0.79 on the right 0.75 on the left and triphasic waveforms bilaterally 9/24; wounds on his bilateral lower feet and lower legs. We have been using silver collagen on the wounds. 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 Follows with Dr. Raliegh Ip" rheumatology at [facility]. 10/1; patient with wounds on his bilateral lower feet and lower legs we have been using silver collagen. 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 He does not have macrovascular disease by previous noninvasive studies in August. 10/8; patient with wounds on his bilateral lower feet and legs. Small punched out areas. I have no doubt he has chronic venous disease however these wounds do not look like classic venous ulcers. He is generally been making good improvements. 10/15; patients with small wounds on his bilateral lower legs. He has nothing left on the right 3 wounds on the left are making good progress. On the left we have the left medial foot, left anterior tibial area and the larger area on the dorsal left foot in close proximity to the second and third  toes. All of these look a lot better. The patient has clear evidence of chronic venous insufficiency although these wounds did not look like venous insufficiency wounds 10/22; patient has no wounds left on the right. He has 3 wounds on the left arm making  good progress. Left medial foot left anterior tibia and the left dorsal foot. These are likely related to venous insufficiency. He has dark fibrotic adherent skin in his bilateral ankle areas. 11/5; still no open wounds on the right. Left medial foot and the left dorsal foot just proximal to the toes of the 2 remaining wounds. The area on the left anterior tibia is healed 11/19; 2-week follow-up. The area on the left medial foot is closed. The only remaining wound is on the left dorsal foot just proximal to the toes. The area on the left anterior tibia also had been previously healed. The patient sees his rheumatologist in early December. I asked him to mention the wounds to the rheumatologist. I had some thoughts about this being rheumatoid vasculitic related and was going to biopsy this although he seems to have done nicely with conservative treatment. The patient has his compression stocking 12/10; 3-week follow-up. He was seen by our nurses in the interim. The only thing he had last time he was here was the opening on the dorsal foot just proximal to the toes. He was wearing a stocking on the right leg. He says in the last week he developed open areas on the medial ankle and medial calf on the right. 05/02/2019; almost 4-week follow-up. He has the original wound in the left dorsal foot which is not a lot better certainly not worse. He had new wounds last time on the right medial ankle medial calf area and on the dorsal fourth toe today.. The latter is the new wound today. The patient probably has chronic venous insufficiency one would have to wonder about rheumatoid vasculitis and if these keep on forming I probably will biopsy one Electronic Signature(s) Signed: 05/03/2019 4:55:01 AM By: Linton Ham MD Entered By: Linton Ham on 05/02/2019 13:30:06 -------------------------------------------------------------------------------- Physical Exam Details Patient Name: Date of  Service: Jonathan [name], Jonathan [name] 05/02/2019 12:30 PM Medical Record [id number] Patient Account Number: [id number] Date of Birth/Sex: Treating RN: October 28, 1960 (59 y.o. M) Primary Care Provider: Sinclair Ship Other Clinician: Referring Provider: Treating Provider/Extender:Jacklyne Baik, Gustavo Lah, LORI Weeks in Treatment: 16 Constitutional Sitting or standing Blood Pressure is within target range for patient.. Pulse regular and within target range for patient.Marland Kitchen Respirations regular, non-labored and within target range.. Temperature is normal and within the target range for the patient.Marland Kitchen Appears in no distress. Respiratory work of breathing is normal. Cardiovascular Pedal pulses are palpable bilaterally. Integumentary (Hair, Skin) The patient has changes of chronic venous insufficiency no other cutaneous changes. Notes Wound exam; the patient's wounds are roughly the same as last time these include the area on the right distal medial calf and the medial ankle. These are again are small punched open clean wounds. He has a small area over the right dorsal fourth toe which is new today. Finally he has the area on the left dorsal foot which was one of his original wounds. Electronic Signature(s) Signed: 05/03/2019 4:55:01 AM By: Linton Ham MD Entered By: Linton Ham on 05/02/2019 13:34:49 -------------------------------------------------------------------------------- Physician Orders Details Patient Name: Date of Service: Jonathan [name], Jonathan [name] 05/02/2019 12:30 PM Medical Record [id number] Patient Account Number: [id number] Date of Birth/Sex: Treating RN: 04/27/1960 (59 y.o. [name] Primary Care Provider: Sinclair Ship Other Clinician:  Referring Provider: Treating Provider/Extender:Otelia Hettinger, Gustavo Lah, LORI Weeks in Treatment: 16 Verbal / Phone Orders: No Diagnosis Coding ICD-10 Coding Code Description L97.521 Non-pressure chronic ulcer of other part of left foot limited  to breakdown of skin L97.821 Non-pressure chronic ulcer of other part of left lower leg limited to breakdown of skin I87.331 Chronic venous hypertension (idiopathic) with ulcer and inflammation of right lower extremity M05.80 Other rheumatoid arthritis with rheumatoid factor of unspecified site L97.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 - Thursday Dressing Change Frequency Change dressing three times week. - twice a week by home health. Skin Barriers/Peri-Wound Care TCA Cream or Ointment - liberally to both legs, feet, toes, and all wounds in clinic and at home by home health. Wound Cleansing Wound #5 Left,Dorsal Foot Clean wound with Wound Cleanser - or normal saline Primary Wound Dressing Wound #10 Right,Medial Ankle Hydrofera Blue Wound #11 Right Toe Third Hydrofera Blue Wound #5 Left,Dorsal Foot Hydrofera Blue Wound #9 Right,Medial Lower Leg Hydrofera Blue Secondary Dressing Wound #10 Right,Medial Ankle Dry Gauze Wound #5 Left,Dorsal Foot Dry Gauze Wound #9 Right,Medial Lower Leg Dry Gauze Wound #11 Right Toe Third Kerlix/Rolled Gauze Dry Gauze Edema Control 3 Layer Compression System - Bilateral Avoid standing for long periods of time Elevate legs to the level of the heart or above for 30 minutes daily and/or when sitting, a frequency of: - throughout the day. [place] skilled nursing for wound care. - [facility]. Electronic Signature(s) Signed: 05/02/2019 5:19:45 PM By: Deon Pilling Signed: 05/03/2019 4:55:01 AM By: Linton Ham MD Entered By: Deon Pilling on 05/02/2019 13:21:28 -------------------------------------------------------------------------------- Problem List Details Patient Name: Date of Service: Jonathan [name], Jonathan [name] 05/02/2019 12:30 PM Medical Record  [id number] Patient Account Number: [id number] Date of Birth/Sex: Treating RN: 1961-01-14 (59 y.o. [name] Primary Care Provider: Sinclair Ship Other Clinician: Referring Provider: Treating Provider/Extender:Maanya Hippert, Gustavo Lah, Cynda Familia in Treatment: 16 Active Problems ICD-10 Evaluated Encounter Code Description Active Date Today Diagnosis L97.521 Non-pressure chronic ulcer of other part of left foot 01/10/2019 No Yes limited to breakdown of skin L97.821 Non-pressure chronic ulcer of other part of left lower 01/10/2019 No Yes leg limited to breakdown of skin I87.331 Chronic venous hypertension (idiopathic) with ulcer 01/10/2019 No Yes and inflammation of right lower extremity M05.80 Other rheumatoid arthritis with rheumatoid factor of 01/10/2019 No Yes unspecified site L97.211 Non-pressure chronic ulcer of right calf limited to 04/04/2019 No Yes breakdown of skin L97.311 Non-pressure chronic ulcer of right ankle limited to 04/04/2019 No Yes breakdown of skin Inactive Problems ICD-10 Code Description Active Date Inactive Date L97.511 Non-pressure chronic ulcer of other part of right foot limited to 01/10/2019 01/10/2019 breakdown of skin L97.812 Non-pressure chronic ulcer of other part of right lower leg with 01/10/2019 01/10/2019 fat layer exposed Resolved Problems Electronic Signature(s) Signed: 05/03/2019 4:55:01 AM By: Linton Ham MD Entered By: Linton Ham on 05/02/2019 13:26:46 -------------------------------------------------------------------------------- Progress Note Details Patient Name: Date of Service: Jonathan [name], Jonathan [name] 05/02/2019 12:30 PM Medical Record [id number] Patient Account Number: [id number] Date of Birth/Sex: Treating RN: 10/30/1960 (59 y.o. M) Primary Care Provider: Sinclair Ship Other Clinician: Referring Provider: Treating Provider/Extender:Taquilla Downum, Gustavo Lah, Cynda Familia in Treatment: 16 Subjective History of Present  Illness (HPI) ADMISSION 01/10/2019 This is a 59 year old man who has rheumatoid arthritis on Remicade. He is not a diabetic. He had forefoot surgery by Dr.  Wegner/Evans on June 25 for correction of hammertoes. At some point in the postop follow-up he presented with multiple wounds on the left foot. I do not really have a good description at this point but I will try to look through [facility] health link. He had arterial studies on 11/28/2018 that were within normal limits. Seen by podiatry on 8/24 with multiple left foot and lower extremity ulcers. He was treated with Bactrim and Cipro. He was hospitalized from 8/28 through 8/30 with wounds on the left anterior foot. An MRI was negative at that point the wounds were on the left anterior and left medial foot. Felt to have cellulitis. He saw his primary doctor in follow-up on 9/3 and felt to have pressure ulcers on the foot. The patient states that he developed these after some acute swelling last month. This may have been a result of infection. He comes in today with multiple wounds on his toes dorsal feet and distal lower extremities anteriorly. The exact cause of this is not completely clear. He does not have a prior wound history. He has multiple lower extremity wounds including the left medial foot, left dorsal foot, left second toe, left anterior mid tibia, right dorsal foot, right fourth toe medially and the right medial lower leg. The areas on the left medial foot and left dorsal foot are large superficial wounds most of the rest of these are small punched out looking areas. He does not complain of a lot of pain Past medical history; hypertension, rheumatoid arthritis on Remicade infusions, hyperlipidemia right forefoot surgery on 6/25, he is on Xarelto at this point for reasons that are not totally clear. Arterial studies on 11/28/2018 showed an ABI of 0.98 on the right 1.01 on the left. TBI's of 0.79 on the right 0.75 on the left and triphasic  waveforms bilaterally 9/24; wounds on his bilateral lower feet and lower legs. We have been using silver collagen on the wounds. 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 Follows with Dr. Raliegh Ip" rheumatology at [facility]. 10/1; patient with wounds on his bilateral lower feet and lower legs we have been using silver collagen. 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 He does not have macrovascular disease by previous noninvasive studies in August. 10/8; patient with wounds on his bilateral lower feet and legs. Small punched out areas. I have no doubt he has chronic venous disease however these wounds do not look like classic venous ulcers. He is generally been making good improvements. 10/15; patients with small wounds on his bilateral lower legs. He has nothing left on the right 3 wounds on the left are making good progress. On the left we have the left medial foot, left anterior tibial area and the larger area on the dorsal left foot in close proximity to the second and third toes. All of these look a lot better. The patient has clear evidence of chronic venous insufficiency although these wounds did not look like venous insufficiency wounds 10/22; patient has no wounds left on the right. He has 3 wounds on the left arm making good progress. Left medial foot left anterior tibia and the left dorsal foot. These are likely related to venous insufficiency. He has dark fibrotic adherent skin in his bilateral ankle areas. 11/5; still no open wounds on the  right. Left medial foot and the left dorsal foot just proximal to the toes of the 2 remaining wounds. The area on the left anterior tibia is healed 11/19; 2-week follow-up. The area on the left medial foot is closed.  The only remaining wound is on the left dorsal foot just proximal to the toes. The area on the left anterior tibia also had been previously healed. The patient sees his rheumatologist in early December. I asked him to mention the wounds to the rheumatologist. I had some thoughts about this being rheumatoid vasculitic related and was going to biopsy this although he seems to have done nicely with conservative treatment. The patient has his compression stocking 12/10; 3-week follow-up. He was seen by our nurses in the interim. The only thing he had last time he was here was the opening on the dorsal foot just proximal to the toes. He was wearing a stocking on the right leg. He says in the last week he developed open areas on the medial ankle and medial calf on the right. 05/02/2019; almost 4-week follow-up. He has the original wound in the left dorsal foot which is not a lot better certainly not worse. He had new wounds last time on the right medial ankle medial calf area and on the dorsal fourth toe today.. The latter is the new wound today. The patient probably has chronic venous insufficiency one would have to wonder about rheumatoid vasculitis and if these keep on forming I probably will biopsy one Objective Constitutional Sitting or standing Blood Pressure is within target range for patient.. Pulse regular and within target range for patient.Marland Kitchen Respirations regular, non-labored and within target range.. Temperature is normal and within the target range for the patient.Marland Kitchen Appears in no distress. Vitals Time Taken: 1:00 PM, Height: 63 in, Weight: 162 lbs, BMI: 28.7, Temperature: 98.6 F, Pulse: 90 bpm, Respiratory Rate: 19 breaths/min, Blood Pressure: 126/88 mmHg. Respiratory work of breathing is normal. Cardiovascular Pedal pulses are palpable bilaterally. General Notes: Wound exam; the patient's wounds are roughly the same as last time these include the area on the right distal medial calf  and the medial ankle. These are again are small punched open clean wounds. He has a small area over the right dorsal fourth toe which is new today. Finally he has the area on the left dorsal foot which was one of his original wounds. Integumentary (Hair, Skin) The patient has changes of chronic venous insufficiency no other cutaneous changes. Wound #10 status is Open. Original cause of wound was Gradually Appeared. The wound is located on the Right,Medial Ankle. The wound measures 1.6cm length x 1.1cm width x 0.2cm depth; 1.382cm^2 area and 0.276cm^3 volume. There is Fat Layer (Subcutaneous Tissue) Exposed exposed. There is no tunneling or undermining noted. There is a small amount of serosanguineous drainage noted. The wound margin is distinct with the outline attached to the wound base. There is medium (34-66%) red, pink granulation within the wound bed. There is a medium (34-66%) amount of necrotic tissue within the wound bed including Adherent Slough. Wound #11 status is Open. Original cause of wound was Gradually Appeared. The wound is located on the Right Toe Third. The wound measures 1cm length x 1cm width x 0.1cm depth; 0.785cm^2 area and 0.079cm^3 volume. There is Fat Layer (Subcutaneous Tissue) Exposed exposed. There is no tunneling or undermining noted. There is a small amount of serosanguineous drainage noted. The wound margin is distinct with the outline attached to the wound base. There  is large (67-100%) red, pink granulation within the wound bed. There is no necrotic tissue within the wound bed. Wound #5 status is Open. Original cause of wound was Gradually Appeared. The wound is located on the Left,Dorsal Foot. The wound measures 2.5cm length x 1.9cm width x 0.1cm depth; 3.731cm^2 area and 0.373cm^3 volume. There is Fat Layer (Subcutaneous Tissue) Exposed exposed. There is no tunneling or undermining noted. There is a small amount of serosanguineous drainage noted. The wound  margin is distinct with the outline attached to the wound base. There is medium (34-66%) red granulation within the wound bed. There is a medium (34-66%) amount of necrotic tissue within the wound bed including Adherent Slough. Wound #9 status is Open. Original cause of wound was Gradually Appeared. The wound is located on the Right,Medial Lower Leg. The wound measures 1cm length x 1cm width x 0.1cm depth; 0.785cm^2 area and 0.079cm^3 volume. There is Fat Layer (Subcutaneous Tissue) Exposed exposed. There is no tunneling or undermining noted. There is a small amount of serosanguineous drainage noted. The wound margin is distinct with the outline attached to the wound base. There is large (67-100%) red, pink granulation within the wound bed. There is a small (1-33%) amount of necrotic tissue within the wound bed including Adherent Slough. 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 There was a Three Layer Compression Therapy Procedure by Baruch Gouty, RN. Post procedure Diagnosis Wound #10: Same as Pre-Procedure Wound #5 Pre-procedure diagnosis of Wound #5 is an Inflammatory located on the Left,Dorsal Foot . There was a Three Layer Compression Therapy Procedure by Baruch Gouty, RN. Post procedure Diagnosis Wound #5: Same as Pre-Procedure Wound #9 Pre-procedure diagnosis of Wound #9 is an Inflammatory located on the  Right,Medial Lower Leg . There was a Three Layer Compression Therapy Procedure by Baruch Gouty, RN. Post procedure Diagnosis Wound #9: Same as Pre-Procedure Plan Follow-up Appointments: Return Appointment in 2 weeks. - Thursday Dressing Change Frequency: Change dressing three times week. - twice a week by home health. Skin Barriers/Peri-Wound Care: TCA Cream or Ointment - liberally to both legs, feet, toes, and all wounds in clinic and at home by home health. Wound Cleansing: Wound #5 Left,Dorsal Foot: Clean wound with Wound Cleanser - or normal saline Primary Wound Dressing: Wound #10 Right,Medial Ankle: Hydrofera Blue Wound #11 Right Toe Third: Hydrofera Blue Wound #5 Left,Dorsal Foot: Hydrofera Blue Wound #9 Right,Medial Lower Leg: Hydrofera Blue Secondary Dressing: Wound #10 Right,Medial Ankle: Dry Gauze Wound #5 Left,Dorsal Foot: Dry Gauze Wound #9 Right,Medial Lower Leg: Dry Gauze Wound #11 Right Toe Third: Kerlix/Rolled Gauze Dry Gauze Edema Control: 3 Layer Compression System - Bilateral Avoid standing for long periods of time Elevate legs to the level of the heart or above for 30 minutes daily and/or when sitting, a frequency of: - throughout the day. Home Health: [place] skilled [facility] for wound care. - [facility]. 1. The patient's wounds look clean however they are recurrent and nearly similar each time. They  do not really look exactly like venous ulcers. 2. At 1 point as long as these were healing I was content to let it do so without considering a biopsy however I will probably consider biopsying the left dorsal footo Rheumatoid vasculitis Electronic Signature(s) Signed: 05/03/2019 4:55:01 AM By: Linton Ham MD Entered By: Linton Ham on 05/02/2019 13:35:49 -------------------------------------------------------------------------------- SuperBill Details Patient Name: Date of Service: Jonathan [name], Jonathan [name] 05/02/2019 Medical  Record [id number] Patient Account Number: [id number] Date of Birth/Sex: Treating RN: 1960/05/18 (59 y.o. M) Primary Care Provider: Sinclair Ship Other Clinician: Referring Provider: Treating Provider/Extender:Ximenna Fonseca, Gustavo Lah, Cynda Familia in Treatment: 16 Diagnosis Coding ICD-10 Codes Code Description L97.521 Non-pressure chronic ulcer of other part of left foot limited to breakdown of skin L97.821 Non-pressure chronic ulcer of other part of left lower leg limited to breakdown of skin I87.331 Chronic venous hypertension (idiopathic) with ulcer and inflammation of right lower extremity M05.80 Other rheumatoid arthritis with rheumatoid factor of unspecified site L97.211 Non-pressure chronic ulcer of right calf limited to breakdown of skin L97.311 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 Physician Procedures Electronic Signature(s) Signed: 05/02/2019 5:19:45 PM By: Deon Pilling Signed: 05/03/2019 4:55:01 AM By: Linton Ham MD Entered By: Deon Pilling on 05/02/2019 14:34:05

## 2019-05-16 ENCOUNTER — Other Ambulatory Visit (HOSPITAL_BASED_OUTPATIENT_CLINIC_OR_DEPARTMENT_OTHER): Payer: Self-pay | Admitting: Internal Medicine

## 2019-05-16 ENCOUNTER — Encounter (HOSPITAL_BASED_OUTPATIENT_CLINIC_OR_DEPARTMENT_OTHER): Payer: Medicare Other | Admitting: Internal Medicine

## 2019-05-16 ENCOUNTER — Other Ambulatory Visit: Payer: Self-pay

## 2019-05-16 ENCOUNTER — Encounter (HOSPITAL_BASED_OUTPATIENT_CLINIC_OR_DEPARTMENT_OTHER): Payer: Self-pay

## 2019-05-16 DIAGNOSIS — L97521 Non-pressure chronic ulcer of other part of left foot limited to breakdown of skin: Secondary | ICD-10-CM | POA: Diagnosis not present

## 2019-05-16 NOTE — Progress Notes (Addendum)
SHAHZEB, BAYAT ([id number]) Visit Report for 05/16/2019 Biopsy Details Patient Name: Date of Service: Jonathan [name], Jonathan [name] 05/16/2019 1:00 PM Medical Record [id number] Patient Account Number: [id number] Date of Birth/Sex: 03/24/1961 (59 y.o. M) Treating RN: Deon Pilling Primary Care Provider: Sinclair Ship Other Clinician: Referring Provider: Treating Provider/Extender:Korion Cuevas, Gustavo Lah, Cynda Familia in Treatment: 18 Biopsy Performed for: Wound #5 Left, Dorsal Foot Location(s): Wound Bed Performed By: Physician Ricard Dillon., MD Tissue Punch: No Number of Specimens Taken: 1 Specimen Sent To Pathology: Yes Level of Consciousness (Pre-procedure): Awake and Alert Pre-procedure Verification/Time-Out Yes - 14:20 Taken: Pain Control: Lidocaine Injectable Lidocaine Percent: 1% Instrument: Blade, Forceps Bleeding: Moderate Hemostasis Achieved: Silver Nitrate Procedural Pain: 0 Post Procedural Pain: 0 Response to Treatment: Procedure was tolerated well Level of Consciousness (Post-procedure): Awake and Alert Post Procedure Diagnosis Same as Pre-procedure Electronic Signature(s) Signed: 05/16/2019 5:54:58 PM By: Linton Ham MD Signed: 05/16/2019 6:20:01 PM By: Deon Pilling Entered By: Deon Pilling on 05/16/2019 14:37:39 -------------------------------------------------------------------------------- Biopsy Details Patient Name: Date of Service: Jonathan [name] 05/16/2019 1:00 PM Medical Record [id number] Patient Account Number: [id number] Date of Birth/Sex: Treating RN: 1960-11-01 (59 y.o. [name] Primary Care Provider: Sinclair Ship Other Clinician: Referring Provider: Treating Provider/Extender:Rushton Early, Gustavo Lah, Cynda Familia in Treatment: 18 Biopsy Performed for: Wound #9 Right, Medial Lower Leg Performed By: Physician Ricard Dillon., MD Tissue Punch: No Number of Specimens Taken: 1 Specimen Sent To Pathology: Yes Level of Consciousness  (Pre-procedure): Awake and Alert Pre-procedure Verification/Time-Out Yes - 14:20 Taken: Pain Control: Lidocaine Injectable Lidocaine Percent: 1% Instrument: Blade, Forceps Bleeding: Moderate Hemostasis Achieved: Silver Nitrate Procedural Pain: 0 Post Procedural Pain: 0 Response to Treatment: Procedure was tolerated well Level of Consciousness (Post-procedure): Awake and Alert Post Procedure Diagnosis Same as Pre-procedure Electronic Signature(s) Signed: 05/16/2019 5:54:58 PM By: Linton Ham MD Signed: 05/16/2019 6:20:01 PM By: Deon Pilling Entered By: Deon Pilling on 05/16/2019 14:38:15 -------------------------------------------------------------------------------- HPI Details Patient Name: Date of Service: Jonathan [name] 05/16/2019 1:00 PM Medical Record [id number] Patient Account Number: [id number] Date of Birth/Sex: Treating RN: 1961-02-26 (59 y.o. M) Primary Care Provider: Sinclair Ship Other Clinician: Referring Provider: Treating Provider/Extender:Hadriel Northup, Gustavo Lah, Cynda Familia in Treatment: 18 History of Present Illness HPI Description: ADMISSION 01/10/2019 This is a 59 year old man who has rheumatoid arthritis on Remicade. He is not a diabetic. He had forefoot surgery by Dr. Bettye Boeck on June 25 for correction of hammertoes. At some point in the postop follow-up he presented with multiple wounds on the left foot. I do not really have a good description at this point but I will try to look through [facility] health link. He had arterial studies on 11/28/2018 that were within normal limits. Seen by podiatry on 8/24 with multiple left foot and lower extremity ulcers. He was treated with Bactrim and Cipro. He was hospitalized from 8/28 through 8/30 with wounds on the left anterior foot. An MRI was negative at that point the wounds were on the left anterior and left medial foot. Felt to have cellulitis. He saw his primary doctor in follow-up on 9/3 and felt to  have pressure ulcers on the foot. The patient states that he developed these after some acute swelling last month. This may have been a result of infection. He comes in today with multiple wounds on his toes dorsal feet and distal lower extremities anteriorly. The exact cause of this is not completely clear. He does not have a prior wound history. He has multiple lower extremity wounds including the left medial foot, left  dorsal foot, left second toe, left anterior mid tibia, right dorsal foot, right fourth toe medially and the right medial lower leg. The areas on the left medial foot and left dorsal foot are large superficial wounds most of the rest of these are small punched out looking areas. He does not complain of a lot of pain Past medical history; hypertension, rheumatoid arthritis on Remicade infusions, hyperlipidemia right forefoot surgery on 6/25, he is on Xarelto at this point for reasons that are not totally clear. Arterial studies on 11/28/2018 showed an ABI of 0.98 on the right 1.01 on the left. TBI's of 0.79 on the right 0.75 on the left and triphasic waveforms bilaterally 9/24; wounds on his bilateral lower feet and lower legs. We have been using silver collagen on the wounds. 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 Follows with Dr. Raliegh Ip" rheumatology at [facility]. 10/1; patient with wounds on his bilateral lower feet and lower legs we have been using silver collagen. 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 He does not have macrovascular disease by previous noninvasive studies in August. 10/8; patient with wounds on his bilateral lower feet and legs. Small punched out areas. I have no doubt he has chronic venous disease however these wounds do not  look like classic venous ulcers. He is generally been making good improvements. 10/15; patients with small wounds on his bilateral lower legs. He has nothing left on the right 3 wounds on the left are making good progress. On the left we have the left medial foot, left anterior tibial area and the larger area on the dorsal left foot in close proximity to the second and third toes. All of these look a lot better. The patient has clear evidence of chronic venous insufficiency although these wounds did not look like venous insufficiency wounds 10/22; patient has no wounds left on the right. He has 3 wounds on the left arm making good progress. Left medial foot left anterior tibia and the left dorsal foot. These are likely related to venous insufficiency. He has dark fibrotic adherent skin in his bilateral ankle areas. 11/5; still no open wounds on the right. Left medial foot and the left dorsal foot just proximal to the toes of the 2 remaining wounds. The area on the left anterior tibia is healed 11/19; 2-week follow-up. The area on the left medial foot is closed. The only remaining wound is on the left dorsal foot just proximal to the toes. The area on the left anterior tibia also had been previously healed. The patient sees his rheumatologist in early December. I asked him to mention the wounds to the rheumatologist. I had some thoughts about this being rheumatoid vasculitic related and was going to biopsy this although he seems to have done nicely with conservative treatment. The patient has his compression stocking 12/10; 3-week follow-up. He was seen by our nurses in the interim. The only thing he had last time he was here was the opening on the dorsal foot just proximal to the toes. He was wearing a stocking on the right leg. He says in the last week he developed open areas on the medial ankle and medial calf on the right. 05/02/2019; almost 4-week follow-up. He has the original wound in the left  dorsal foot which is  not a lot better certainly not worse. He had new wounds last time on the right medial ankle medial calf area and on the dorsal fourth toe today.. The latter is the new wound today. The patient probably has chronic venous insufficiency one would have to wonder about rheumatoid vasculitis and if these keep on forming I probably will biopsy one 1/21; the patient has a new wound on the right third toe which probes to bone. We still do not have an exact diagnosis of this. I went ahead and biopsied the right medial leg and the left dorsal foot these were shave biopsieso Vasculitis Electronic Signature(s) Signed: 05/16/2019 5:54:58 PM By: Linton Ham MD Entered By: Linton Ham on 05/16/2019 14:36:35 -------------------------------------------------------------------------------- Physical Exam Details Patient Name: Date of Service: EAIN, LANSER 05/16/2019 1:00 PM Medical Record [id number] Patient Account Number: [id number] Date of Birth/Sex: Treating RN: 1960/05/01 (59 y.o. M) Primary Care Provider: Sinclair Ship Other Clinician: Referring Provider: Treating Provider/Extender:Shadana Pry, Gustavo Lah, Cynda Familia in Treatment: 18 Constitutional Sitting or standing Blood Pressure is within target range for patient.. Pulse regular and within target range for patient.Marland Kitchen Respirations regular, non-labored and within target range.. Temperature is normal and within the target range for the patient.Marland Kitchen Appears in no distress. Notes Wound exam; the patient's wounds are roughly in the same shape however he has a new one on the PIP of the right third toe that probes to bone. We still do not have an exact etiology of this. The area on the right medial calf and left dorsal foot were anesthetized with lidocaine and shave biopsies done for pathology Electronic Signature(s) Signed: 05/16/2019 5:54:58 PM By: Linton Ham MD Entered By: Linton Ham on 05/16/2019  14:37:23 -------------------------------------------------------------------------------- Physician Orders Details Patient Name: Date of Service: AIRRION, VOSE 05/16/2019 1:00 PM Medical Record [id number] Patient Account Number: [id number] Date of Birth/Sex: Treating RN: [birth year] (59 y.o. [name] Primary Care Provider: Sinclair Ship Other Clinician: Referring Provider: Treating Provider/Extender:Fable Huisman, Gustavo Lah, Cynda Familia in Treatment: 65 Verbal / Phone Orders: No Diagnosis Coding ICD-10 Coding Code Description L97.521 Non-pressure chronic ulcer of other part of left foot limited to breakdown of skin L97.821 Non-pressure chronic ulcer of other part of left lower leg limited to breakdown of skin I87.331 Chronic venous hypertension (idiopathic) with ulcer and inflammation of right lower extremity M05.80 Other rheumatoid arthritis with rheumatoid factor of unspecified site L97.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 - Thursday Dressing Change Frequency Change dressing three times week. - twice a week by home health. Skin Barriers/Peri-Wound Care TCA Cream or Ointment - liberally to both legs, feet, toes, and all wounds in clinic and at home by home health. Wound Cleansing Wound #5 Left,Dorsal Foot Clean wound with Wound Cleanser - or normal saline Primary Wound Dressing Wound #10 Right,Medial Ankle Hydrofera Blue Wound #11 Right Toe Third Endoform - moisten with saline or hydrogel. Wound #5 Left,Dorsal Foot Hydrofera Blue Wound #9 Right,Medial Lower Leg Hydrofera Blue Secondary Dressing Wound #10 Right,Medial Ankle Dry Gauze Wound #11 Right Toe Third Kerlix/Rolled Gauze Dry Gauze Wound #5 Left,Dorsal Foot Dry Gauze Wound #9 Right,Medial Lower Leg Dry Gauze Edema Control 3 Layer  Compression System - Bilateral Avoid standing for long periods of time Elevate legs to the level of the heart or above for 30 minutes daily and/or when sitting, a frequency of: - throughout the day. Home  [place] [facility] for wound care. - [facility]. Laboratory Bacteria identified in Tissue by Biopsy culture (MICRO) - shave biopsy LOINC Code: [id number] Convenience Name: Biopsy specimen culture Bacteria identified in Tissue by Biopsy culture (MICRO) - shave biopsy LOINC Code: [id number] Convenience Name: Biopsy specimen culture Electronic Signature(s) Signed: 05/16/2019 5:54:58 PM By: Linton Ham MD Signed: 05/16/2019 6:20:01 PM By: Deon Pilling Entered By: Deon Pilling on 05/16/2019 14:29:53 -------------------------------------------------------------------------------- Problem List Details Patient Name: Date of Service: JAMIEON, DOLMAN 05/16/2019 1:00 PM Medical Record [id number] Patient Account Number: [id number] Date of Birth/Sex: Treating RN: 19-Sep-1960 (58 y.o. [name] Primary Care Provider: Sinclair Ship Other Clinician: Referring Provider: Treating Provider/Extender:Linus Weckerly, Gustavo Lah, Cynda Familia in Treatment: 18 Active Problems ICD-10 Evaluated Encounter Code Description Active Date Today Diagnosis L97.521 Non-pressure chronic ulcer of other part of left foot 01/10/2019 No Yes limited to breakdown of skin L97.821 Non-pressure chronic ulcer of other part of left lower 01/10/2019 No Yes leg limited to breakdown of skin I87.331 Chronic venous hypertension (idiopathic) with ulcer 01/10/2019 No Yes and inflammation of right lower extremity M05.80 Other rheumatoid arthritis with rheumatoid factor of 01/10/2019 No Yes unspecified site L97.211 Non-pressure chronic ulcer of right calf limited to 04/04/2019 No Yes breakdown of skin L97.311 Non-pressure chronic ulcer of right ankle limited to 04/04/2019 No Yes breakdown of  skin Inactive Problems ICD-10 Code Description Active Date Inactive Date L97.511 Non-pressure chronic ulcer of other part of right foot limited to 01/10/2019 01/10/2019 breakdown of skin L97.812 Non-pressure chronic ulcer of other part of right lower leg with 01/10/2019 01/10/2019 fat layer exposed Resolved Problems Electronic Signature(s) Signed: 05/16/2019 5:54:58 PM By: Linton Ham MD Entered By: Linton Ham on 05/16/2019 14:32:28 -------------------------------------------------------------------------------- Progress Note Details Patient Name: Date of Service: CHRISTIOPHER, RAIOLA 05/16/2019 1:00 PM Medical Record [id number] Patient Account Number: [id number] Date of Birth/Sex: Treating RN: [birth year] (59 y.o. M) Primary Care Provider: Sinclair Ship Other Clinician: Referring Provider: Treating Provider/Extender:Prophet Renwick, Gustavo Lah, Cynda Familia in Treatment: 18 Subjective History of Present Illness (HPI) ADMISSION 01/10/2019 This is a 59 year old man who has rheumatoid arthritis on Remicade. He is not a diabetic. He had forefoot surgery by Dr. Bettye Boeck on June 25 for correction of hammertoes. At some point in the postop follow-up he presented with multiple wounds on the left foot. I do not really have a good description at this point but I will try to look through [facility] health link. He had arterial studies on 11/28/2018 that were within normal limits. Seen by podiatry on 8/24 with multiple left foot and lower extremity ulcers. He was treated with Bactrim and Cipro. He was hospitalized from 8/28 through 8/30 with wounds on the left anterior foot. An MRI was negative at that point the wounds were on the left anterior and left medial foot. Felt to have cellulitis. He saw his primary doctor in follow-up on 9/3 and felt to have pressure ulcers on the foot. The patient states that he developed these after some acute swelling last month. This may have been a result of  infection. He comes in today with multiple wounds on his toes dorsal feet and distal lower extremities anteriorly. The exact cause of this is not completely clear. He does not have a prior wound history. He has multiple lower extremity wounds including the left medial foot, left dorsal foot, left second toe, left anterior mid tibia, right dorsal foot, right fourth toe medially and the right medial lower leg. The areas on the left medial  foot and left dorsal foot are large superficial wounds most of the rest of these are small punched out looking areas. He does not complain of a lot of pain Past medical history; hypertension, rheumatoid arthritis on Remicade infusions, hyperlipidemia right forefoot surgery on 6/25, he is on Xarelto at this point for reasons that are not totally clear. Arterial studies on 11/28/2018 showed an ABI of 0.98 on the right 1.01 on the left. TBI's of 0.79 on the right 0.75 on the left and triphasic waveforms bilaterally 9/24; wounds on his bilateral lower feet and lower legs. We have been using silver collagen on the wounds. 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 Follows with Dr. Raliegh Ip" rheumatology at [facility]. 10/1; patient with wounds on his bilateral lower feet and lower legs we have been using silver collagen. 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 He does not have macrovascular disease by previous noninvasive studies in August. 10/8; patient with wounds on his bilateral lower feet and legs. Small punched out areas. I have no doubt he has chronic venous disease however these wounds do not look like classic venous ulcers. He is generally been making good improvements. 10/15; patients with small wounds on his bilateral lower legs. He  has nothing left on the right 3 wounds on the left are making good progress. On the left we have the left medial foot, left anterior tibial area and the larger area on the dorsal left foot in close proximity to the second and third toes. All of these look a lot better. The patient has clear evidence of chronic venous insufficiency although these wounds did not look like venous insufficiency wounds 10/22; patient has no wounds left on the right. He has 3 wounds on the left arm making good progress. Left medial foot left anterior tibia and the left dorsal foot. These are likely related to venous insufficiency. He has dark fibrotic adherent skin in his bilateral ankle areas. 11/5; still no open wounds on the right. Left medial foot and the left dorsal foot just proximal to the toes of the 2 remaining wounds. The area on the left anterior tibia is healed 11/19; 2-week follow-up. The area on the left medial foot is closed. The only remaining wound is on the left dorsal foot just proximal to the toes. The area on the left anterior tibia also had been previously healed. The patient sees his rheumatologist in early December. I asked him to mention the wounds to the rheumatologist. I had some thoughts about this being rheumatoid vasculitic related and was going to biopsy this although he seems to have done nicely with conservative treatment. The patient has his compression stocking 12/10; 3-week follow-up. He was seen by our nurses in the interim. The only thing he had last time he was here was the opening on the dorsal foot just proximal to the toes. He was wearing a stocking on the right leg. He says in the last week he developed open areas on the medial ankle and medial calf on the right. 05/02/2019; almost 4-week follow-up. He has the original wound in the left dorsal foot which is not a lot better certainly not worse. He had new wounds last time on the right medial ankle medial calf area and on the  dorsal fourth toe today.Marland Kitchen  The latter is the new wound today. The patient probably has chronic venous insufficiency one would have to wonder about rheumatoid vasculitis and if these keep on forming I probably will biopsy one 1/21; the patient has a new wound on the right third toe which probes to bone. We still do not have an exact diagnosis of this. I went ahead and biopsied the right medial leg and the left dorsal foot these were shave biopsieso Vasculitis Objective Constitutional Sitting or standing Blood Pressure is within target range for patient.. Pulse regular and within target range for patient.Marland Kitchen Respirations regular, non-labored and within target range.. Temperature is normal and within the target range for the patient.Marland Kitchen Appears in no distress. Vitals Time Taken: 1:34 PM, Height: 63 in, Source: Stated, Weight: 162 lbs, Source: Stated, BMI: 28.7, Temperature: 98.7 F, Pulse: 77 bpm, Respiratory Rate: 18 breaths/min, Blood Pressure: 127/67 mmHg. General Notes: Wound exam; the patient's wounds are roughly in the same shape however he has a new one on the PIP of the right third toe that probes to bone. We still do not have an exact etiology of this. The area on the right medial calf and left dorsal foot were anesthetized with lidocaine and shave biopsies done for pathology Integumentary (Hair, Skin) Wound #10 status is Open. Original cause of wound was Gradually Appeared. The wound is located on the Right,Medial Ankle. The wound measures 1.4cm length x 0.7cm width x 0.2cm depth; 0.77cm^2 area and 0.154cm^3 volume. There is Fat Layer (Subcutaneous Tissue) Exposed exposed. There is no tunneling or undermining noted. There is a small amount of serosanguineous drainage noted. The wound margin is distinct with the outline attached to the wound base. There is large (67-100%) red granulation within the wound bed. There is a small (1-33%) amount of necrotic tissue within the wound bed including  Adherent Slough. Wound #11 status is Open. Original cause of wound was Gradually Appeared. The wound is located on the Right Toe Third. The wound measures 0.3cm length x 0.8cm width x 0.3cm depth; 0.188cm^2 area and 0.057cm^3 volume. There is bone and Fat Layer (Subcutaneous Tissue) Exposed exposed. There is no tunneling or undermining noted. There is a small amount of serosanguineous drainage noted. The wound margin is distinct with the outline attached to the wound base. There is medium (34-66%) red granulation within the wound bed. There is a medium (34-66%) amount of necrotic tissue within the wound bed including Adherent Slough. Wound #5 status is Open. Original cause of wound was Gradually Appeared. The wound is located on the Left,Dorsal Foot. The wound measures 1.7cm length x 1.3cm width x 0.1cm depth; 1.736cm^2 area and 0.174cm^3 volume. There is Fat Layer (Subcutaneous Tissue) Exposed exposed. There is no tunneling or undermining noted. There is a small amount of serosanguineous drainage noted. The wound margin is distinct with the outline attached to the wound base. There is small (1-33%) red, pink granulation within the wound bed. There is a large (67-100%) amount of necrotic tissue within the wound bed including Adherent Slough. Wound #9 status is Open. Original cause of wound was Gradually Appeared. The wound is located on the Right,Medial Lower Leg. The wound measures 0.7cm length x 0.6cm width x 0.1cm depth; 0.33cm^2 area and 0.033cm^3 volume. There is Fat Layer (Subcutaneous Tissue) Exposed exposed. There is no tunneling or undermining noted. There is a small amount of serosanguineous drainage noted. The wound margin is flat and intact. There is large (67-100%) red granulation within the wound bed. There is no  necrotic tissue within the wound bed. 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 There was a biopsy performed by Ricard Dillon., MD. There was a biopsy performed on Wound Bed. The skin was cleansed and prepped with anti-septic followed by pain control using Lidocaine Injectable: 1%. Tissue was removed at its base with the following instrument(s): Blade and Forceps and sent to pathology. A Moderate amount of bleeding was controlled with Silver Nitrate. A time out was conducted at 14:20, prior to the start of the procedure. The procedure was tolerated well with a pain level of 0 throughout and a pain level of 0 following the procedure. Post procedure Diagnosis Wound #5: Same as Pre-Procedure Pre-procedure diagnosis of Wound #5 is an Inflammatory located on the Left,Dorsal Foot . There was a Three Layer Compression Therapy Procedure by Baruch Gouty, RN. Post procedure Diagnosis Wound #5: Same as Pre-Procedure Wound #9 Pre-procedure diagnosis of Wound #9 is an Inflammatory located on the Right, Medial Lower Leg . There was a biopsy performed by Ricard Dillon., MD. The skin was cleansed and prepped with anti-septic followed by pain control using Lidocaine Injectable: 1%. Tissue was removed at its base with the following instrument(s): Blade and Forceps and sent to pathology. A Moderate amount of bleeding was controlled with Silver Nitrate. A time out was conducted at 14:20, prior to the start of the procedure. The procedure was tolerated well with a pain level of 0 throughout and a pain  level of 0 following the procedure. Post procedure Diagnosis Wound #9: Same as Pre-Procedure Pre-procedure diagnosis of Wound #9 is an Inflammatory located on the Right,Medial Lower Leg . There was a Three Layer Compression Therapy Procedure by Baruch Gouty, RN. Post procedure Diagnosis Wound #9: Same as Pre-Procedure Wound #10 Pre-procedure diagnosis of Wound #10 is an Inflammatory located on the Right,Medial Ankle . There was a Three Layer Compression Therapy Procedure by Baruch Gouty, RN. Post procedure Diagnosis Wound #10: Same as Pre-Procedure Plan Follow-up Appointments: Return Appointment in 2 weeks. - Thursday Dressing Change Frequency: Change dressing three times week. - twice a week by home health. Skin Barriers/Peri-Wound Care: TCA Cream or Ointment - liberally to both legs, feet, toes, and all wounds in clinic and at home by home health. Wound Cleansing: Wound #5 Left,Dorsal Foot: Clean wound with Wound Cleanser - or normal saline Primary Wound Dressing: Wound #10 Right,Medial Ankle: Hydrofera Blue Wound #11 Right Toe Third: Endoform - moisten with saline or hydrogel. Wound #5 Left,Dorsal Foot: Hydrofera Blue Wound #9 Right,Medial Lower Leg: Hydrofera Blue Secondary Dressing: Wound #10 Right,Medial Ankle: Dry Gauze Wound #11 Right Toe Third: Kerlix/Rolled Gauze Dry Gauze Wound #5 Left,Dorsal Foot: Dry Gauze Wound #9 Right,Medial Lower Leg: Dry Gauze Edema Control: 3 Layer Compression System - Bilateral Avoid standing for long periods of time Elevate legs to the level of the heart or above for 30 minutes daily and/or when sitting, a frequency of: - throughout the day. Home Health: [facility]  for wound care. - [facility]. Laboratory ordered were: Biopsy specimen culture- right medial lower leg - shave biopsy, Biopsy specimen culture- left dorsal foot - shave biopsy 1. Shave biopsies of the right medial leg and left  dorsal foot for pathology 2. Continue with Hydrofera Blue with TCA 3. Endoform to the right third toe which is a deep punched out area Electronic Signature(s) Signed: 05/16/2019 5:54:58 PM By: Linton Ham MD Signed: 05/16/2019 6:20:01 PM By: Deon Pilling Entered By: Deon Pilling on 05/16/2019 14:38:30 -------------------------------------------------------------------------------- SuperBill Details Patient Name: Date of Service: DEYLIN, ADLEY 05/16/2019 Medical Record [id number] Patient Account Number: [id number] Date of Birth/Sex: Treating RN: [birth year] (59 y.o. 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 Facility Procedures The patient participates with Medicare or their insurance follows the Medicare Facility Guidelines: CPT4 Description Modifier Quantity Code [id number] 11102-Tangential biopsy of skin (e.g., shave, scoop, saucerize, curette) single 1 lesion ICD-10 Diagnosis  Description 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 The patient participates with Medicare or their insurance follows the Medicare Facility Guidelines: [id number] 11103-Tangential biopsy of  skin each separate/additional lesion 2 ICD-10 Diagnosis Description L97.521 Non-pressure chronic ulcer of other part  of left foot limited to breakdown of skin L97.211 Non-pressure chronic ulcer of right calf limited to breakdown of skin Physician Procedures CPT4 Code Description: 11102 Tangential biopsy of skin (e.g., shave, scoop, saucerize, curette) single lesion ICD-10 Diagnosis Description L97.521 Non-pressure chronic ulcer of other part of left foot limited L97.211 Non-pressure chronic ulcer of right  calf limited to breakdown Modifier: to breakdown of skin Quantity: 1 of skin Electronic Signature(s) Signed: 05/23/2019 1:17:15 PM By: Linton Ham MD Signed: 06/20/2019 2:24:55 PM By: [name] RN, BSN Previous Signature: 05/16/2019 5:54:58 PM Version By: Linton Ham MD Entered By: [name] on 05/22/2019 10:34:55

## 2019-05-16 NOTE — Progress Notes (Addendum)
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 Vital Signs Time Taken: 13:34 Temperature (F): 98.7 Height (in): 63 Pulse (bpm): 77 Source: Stated Respiratory Rate (breaths/min): 18 Weight (lbs): 162 Blood Pressure (mmHg): 127/67 Source: Stated Reference Range: 80 - 120 mg / dl Body  Mass Index (BMI): 28.7 Electronic Signature(s) Signed: 05/16/2019 5:58:49 PM By: Baruch Gouty RN, BSN Entered By: Baruch Gouty on 05/16/2019 13:34:22

## 2019-05-17 DIAGNOSIS — I87311 Chronic venous hypertension (idiopathic) with ulcer of right lower extremity: Secondary | ICD-10-CM

## 2019-05-17 DIAGNOSIS — M19072 Primary osteoarthritis, left ankle and foot: Secondary | ICD-10-CM

## 2019-05-17 DIAGNOSIS — M0579 Rheumatoid arthritis with rheumatoid factor of multiple sites without organ or systems involvement: Secondary | ICD-10-CM

## 2019-05-17 DIAGNOSIS — L97311 Non-pressure chronic ulcer of right ankle limited to breakdown of skin: Secondary | ICD-10-CM

## 2019-05-30 ENCOUNTER — Encounter (HOSPITAL_BASED_OUTPATIENT_CLINIC_OR_DEPARTMENT_OTHER): Payer: Medicare Other | Attending: Internal Medicine | Admitting: Internal Medicine

## 2019-05-30 ENCOUNTER — Other Ambulatory Visit: Payer: Self-pay

## 2019-05-30 DIAGNOSIS — M069 Rheumatoid arthritis, unspecified: Secondary | ICD-10-CM | POA: Insufficient documentation

## 2019-05-30 DIAGNOSIS — I87331 Chronic venous hypertension (idiopathic) with ulcer and inflammation of right lower extremity: Secondary | ICD-10-CM | POA: Diagnosis not present

## 2019-05-30 DIAGNOSIS — L97821 Non-pressure chronic ulcer of other part of left lower leg limited to breakdown of skin: Secondary | ICD-10-CM | POA: Diagnosis present

## 2019-05-30 DIAGNOSIS — I10 Essential (primary) hypertension: Secondary | ICD-10-CM | POA: Diagnosis not present

## 2019-05-30 DIAGNOSIS — Z7901 Long term (current) use of anticoagulants: Secondary | ICD-10-CM | POA: Diagnosis not present

## 2019-05-30 DIAGNOSIS — L97516 Non-pressure chronic ulcer of other part of right foot with bone involvement without evidence of necrosis: Secondary | ICD-10-CM | POA: Insufficient documentation

## 2019-05-30 DIAGNOSIS — L97521 Non-pressure chronic ulcer of other part of left foot limited to breakdown of skin: Secondary | ICD-10-CM | POA: Insufficient documentation

## 2019-05-30 DIAGNOSIS — L97311 Non-pressure chronic ulcer of right ankle limited to breakdown of skin: Secondary | ICD-10-CM | POA: Diagnosis not present

## 2019-05-30 DIAGNOSIS — L97211 Non-pressure chronic ulcer of right calf limited to breakdown of skin: Secondary | ICD-10-CM | POA: Diagnosis not present

## 2019-05-30 NOTE — Progress Notes (Signed)
BUD, ZILINSKI ([id number]) Visit Report for 05/30/2019 HPI Details Patient Name: Date of Service: Jonathan [name], Jonathan [name] 05/30/2019 1:00 PM Medical Record [id number] Patient Account Number: [id number] Date of Birth/Sex: Treating RN: 05-05-1960 (59 y.o. M) Primary Care Provider: Sinclair Ship Other Clinician: Referring Provider: Treating Provider/Extender:Kordell Jafri, Gustavo Lah, Cynda Familia in Treatment: 20 History of Present Illness HPI Description: ADMISSION 01/10/2019 This is a 59 year old man who has rheumatoid arthritis on Remicade. He is not a diabetic. He had forefoot surgery by Dr. Bettye Boeck on June 25 for correction of hammertoes. At some point in the postop follow-up he presented with multiple wounds on the left foot. I do not really have a good description at this point but I will try to look through [facility] health link. He had arterial studies on 11/28/2018 that were within normal limits. Seen by podiatry on 8/24 with multiple left foot and lower extremity ulcers. He was treated with Bactrim and Cipro. He was hospitalized from 8/28 through 8/30 with wounds on the left anterior foot. An MRI was negative at that point the wounds were on the left anterior and left medial foot. Felt to have cellulitis. He saw his primary doctor in follow-up on 9/3 and felt to have pressure ulcers on the foot. The patient states that he developed these after some acute swelling last month. This may have been a result of infection. He comes in today with multiple wounds on his toes dorsal feet and distal lower extremities anteriorly. The exact cause of this is not completely clear. He does not have a prior wound history. He has multiple lower extremity wounds including the left medial foot, left dorsal foot, left second toe, left anterior mid tibia, right dorsal foot, right fourth toe medially and the right medial lower leg. The areas on the left medial foot and left dorsal foot are large superficial  wounds most of the rest of these are small punched out looking areas. He does not complain of a lot of pain Past medical history; hypertension, rheumatoid arthritis on Remicade infusions, hyperlipidemia right forefoot surgery on 6/25, he is on Xarelto at this point for reasons that are not totally clear. Arterial studies on 11/28/2018 showed an ABI of 0.98 on the right 1.01 on the left. TBI's of 0.79 on the right 0.75 on the left and triphasic waveforms bilaterally 9/24; wounds on his bilateral lower feet and lower legs. We have been using silver collagen on the wounds. 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 Follows with Dr. Raliegh Ip" rheumatology at [facility]. 10/1; patient with wounds on his bilateral lower feet and lower legs we have been using silver collagen. 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 He does not have macrovascular disease by previous noninvasive studies in August. 10/8; patient with wounds on his bilateral lower feet and legs. Small punched out areas. I have no doubt he has chronic venous disease however these wounds do not look like classic venous ulcers. He is generally been making good improvements. 10/15; patients with small wounds on his bilateral lower legs. He has nothing left on the right 3 wounds on the left are making good progress. On the left we have the left medial foot, left anterior tibial area and the larger area on the dorsal left foot in close proximity to the second and third  toes. All of these look a lot better. The patient has clear evidence of chronic venous insufficiency although these wounds did not look like venous insufficiency wounds 10/22; patient has no wounds left on the right. He has 3 wounds on the left arm making  good progress. Left medial foot left anterior tibia and the left dorsal foot. These are likely related to venous insufficiency. He has dark fibrotic adherent skin in his bilateral ankle areas. 11/5; still no open wounds on the right. Left medial foot and the left dorsal foot just proximal to the toes of the 2 remaining wounds. The area on the left anterior tibia is healed 11/19; 2-week follow-up. The area on the left medial foot is closed. The only remaining wound is on the left dorsal foot just proximal to the toes. The area on the left anterior tibia also had been previously healed. The patient sees his rheumatologist in early December. I asked him to mention the wounds to the rheumatologist. I had some thoughts about this being rheumatoid vasculitic related and was going to biopsy this although he seems to have done nicely with conservative treatment. The patient has his compression stocking 12/10; 3-week follow-up. He was seen by our nurses in the interim. The only thing he had last time he was here was the opening on the dorsal foot just proximal to the toes. He was wearing a stocking on the right leg. He says in the last week he developed open areas on the medial ankle and medial calf on the right. 05/02/2019; almost 4-week follow-up. He has the original wound in the left dorsal foot which is not a lot better certainly not worse. He had new wounds last time on the right medial ankle medial calf area and on the dorsal fourth toe today.. The latter is the new wound today. The patient probably has chronic venous insufficiency one would have to wonder about rheumatoid vasculitis and if these keep on forming I probably will biopsy one 1/21; the patient has a new wound on the right third toe which probes to bone. We still do not have an exact diagnosis of this. I went ahead and biopsied the right medial leg and the left dorsal foot these were shave biopsieso Vasculitis 2/4; biopsies I did of the  right medial lower leg and left dorsal foot showed stasis dermatitis without evidence of vasculitis. Special stain was negative for fungus. Fite stain was negative for Mycobacterium. We have been using Hydrofera Blue on the wounds on his legs and endoform to the right third toe. Everything looks a little better today Electronic Signature(s) Signed: 05/30/2019 5:50:02 PM By: Linton Ham MD Entered By: Linton Ham on 05/30/2019 14:12:26 -------------------------------------------------------------------------------- Physical Exam Details Patient Name: Date of Service: Jonathan [name], Jonathan [name] 05/30/2019 1:00 PM Medical Record [id number] Patient Account Number: [id number] Date of Birth/Sex: Treating RN: 1960/11/09 (59 y.o. M) Primary Care Provider: Sinclair Ship Other Clinician: Referring Provider: Treating Provider/Extender:Shaundra Fullam, Gustavo Lah, LORI Weeks in Treatment: 20 Constitutional Sitting or standing Blood Pressure is within target range for patient.. Pulse regular and within target range for patient.Marland Kitchen Respirations regular, non-labored and within target range.. Temperature is normal and within the target range for the patient.Marland Kitchen Appears in no distress. Respiratory work of breathing is normal. Cardiovascular Pedal pulses are palpable at both the posterior tibial and dorsalis pedis. Integumentary (Hair, Skin) Skin changes definitely suggestive of chronic venous insufficiency with venous inflammation. Notes Wound exam; the patient's wounds are roughly the same although the right third toe  looks a better and there is no palpable bone. She has small punched out areas on the right medial lower leg and the right medial ankle. These have some depth. He has a more substantial area on the left dorsal foot which measures 1.7 x 0.9 x 0.2 Electronic Signature(s) Signed: 05/30/2019 5:50:02 PM By: Linton Ham MD Entered By: Linton Ham on 05/30/2019  14:14:23 -------------------------------------------------------------------------------- Physician Orders Details Patient Name: Date of Service: Jonathan [name], Jonathan [name] 05/30/2019 1:00 PM Medical Record [id number] Patient Account Number: [id number] Date of Birth/Sex: Treating RN: 10-Oct-1960 (59 y.o. [name] Primary Care Provider: Sinclair Ship Other Clinician: Referring Provider: Treating Provider/Extender:Ranier Coach, Gustavo Lah, Cynda Familia in Treatment: 20 Verbal / Phone Orders: No Diagnosis Coding ICD-10 Coding Code Description L97.521 Non-pressure chronic ulcer of other part of left foot limited to breakdown of skin L97.821 Non-pressure chronic ulcer of other part of left lower leg limited to breakdown of skin I87.331 Chronic venous hypertension (idiopathic) with ulcer and inflammation of right lower extremity M05.80 Other rheumatoid arthritis with rheumatoid factor of unspecified site L97.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 - Thursday Dressing Change Frequency Change dressing three times week. - twice a week by home health. Skin Barriers/Peri-Wound Care TCA Cream or Ointment - liberally to both legs, feet, toes, and all wounds in clinic and at home by home health. Wound Cleansing May shower and wash wound with soap and water. - with dressing changes. Primary Wound Dressing Wound #10 Right,Medial Ankle Endoform - moisten with hydrogel or saline. Wound #11 Right Toe Third Endoform - moisten with hydrogel or saline. Wound #5 Left,Dorsal Foot Endoform - moisten with hydrogel or saline. Wound #9 Right,Medial Lower Leg Endoform - moisten with hydrogel or saline. Secondary Dressing Wound #10 Right,Medial Ankle Dry Gauze Wound #11 Right Toe Third Kerlix/Rolled Gauze Dry Gauze Wound #5 Left,Dorsal  Foot Dry Gauze Wound #9 Right,Medial Lower Leg Dry Gauze Edema Control 3 Layer Compression System - Bilateral Avoid standing for long periods of time Elevate legs to the level of the heart or above for 30 minutes daily and/or when sitting, a frequency of: - throughout the day. [place] skilled nursing for wound care. - [facility]. Services and Therapies Venous Studies -Bilateral - Venous reflux studies bilateral related non-healing ulcers to bilateral feet and legs. - (ICD10 I87.331 - Chronic venous hypertension (idiopathic) with ulcer and inflammation of right lower extremity) Electronic Signature(s) Signed: 05/30/2019 5:50:02 PM By: Linton Ham MD Signed: 05/30/2019 5:56:18 PM By: Deon Pilling Entered By: Deon Pilling on 05/30/2019 14:12:45 -------------------------------------------------------------------------------- Prescription 05/30/2019 Patient Name: Jonathan [name] Provider: Linton Ham MD Date of Birth: [birth year] NPI#: [id number] Sex: M DEA#: [id number] Phone #: [phone] License #: [id number] Patient Address: [address] [place], [place] [address] [place], [place] [address] [phone] Allergies No Known Allergies Provider's Orders Venous Studies -Bilateral - ICD10: I87.331 - Venous reflux studies bilateral related non-healing ulcers to bilateral feet and legs. Signature(s): Date(s): Engineer, maintenance) Signed: 05/30/2019 5:50:02 PM By: Linton Ham MD Signed: 05/30/2019 5:56:18 PM By: Deon Pilling Entered By: Deon Pilling on 05/30/2019 14:12:47 --------------------------------------------------------------------------------  Problem List Details Patient Name: Date of Service: Jonathan [name], Jonathan [name] 05/30/2019 1:00 PM Medical Record [id number] Patient Account Number: [id number] Date of Birth/Sex: Treating RN: [birth year] (59  y.o. M) Rolin Barry,  Bobbi Primary Care Provider: Sinclair Ship Other Clinician: Referring Provider: Treating Provider/Extender:Nico Rogness, Gustavo Lah, Cynda Familia in Treatment: 20 Active Problems ICD-10 Evaluated Encounter Code Description Active Date Today Diagnosis L97.521 Non-pressure chronic ulcer of other part of left foot 01/10/2019 No Yes limited to breakdown of skin L97.821 Non-pressure chronic ulcer of other part of left lower 01/10/2019 No Yes leg limited to breakdown of skin I87.331 Chronic venous hypertension (idiopathic) with ulcer 01/10/2019 No Yes and inflammation of right lower extremity M05.80 Other rheumatoid arthritis with rheumatoid factor of 01/10/2019 No Yes unspecified site L97.211 Non-pressure chronic ulcer of right calf limited to 04/04/2019 No Yes breakdown of skin L97.311 Non-pressure chronic ulcer of right ankle limited to 04/04/2019 No Yes breakdown of skin Inactive Problems ICD-10 Code Description Active Date Inactive Date L97.511 Non-pressure chronic ulcer of other part of right foot limited to 01/10/2019 01/10/2019 breakdown of skin L97.812 Non-pressure chronic ulcer of other part of right lower leg with 01/10/2019 01/10/2019 fat layer exposed Resolved Problems Electronic Signature(s) Signed: 05/30/2019 5:50:02 PM By: Linton Ham MD Entered By: Linton Ham on 05/30/2019 14:10:39 -------------------------------------------------------------------------------- Progress Note Details Patient Name: Date of Service: Jonathan [name], Jonathan [name] 05/30/2019 1:00 PM Medical Record [id number] Patient Account Number: [id number] Date of Birth/Sex: Treating RN: [birth year] (59 y.o. M) Primary Care Provider: Sinclair Ship Other Clinician: Referring Provider: Treating Provider/Extender:Crayton Savarese, Gustavo Lah, Cynda Familia in Treatment: 20 Subjective History of Present Illness (HPI) ADMISSION 01/10/2019 This is a 59 year old man who has rheumatoid arthritis on Remicade. He  is not a diabetic. He had forefoot surgery by Dr. Bettye Boeck on June 25 for correction of hammertoes. At some point in the postop follow-up he presented with multiple wounds on the left foot. I do not really have a good description at this point but I will try to look through [facility] health link. He had arterial studies on 11/28/2018 that were within normal limits. Seen by podiatry on 8/24 with multiple left foot and lower extremity ulcers. He was treated with Bactrim and Cipro. He was hospitalized from 8/28 through 8/30 with wounds on the left anterior foot. An MRI was negative at that point the wounds were on the left anterior and left medial foot. Felt to have cellulitis. He saw his primary doctor in follow-up on 9/3 and felt to have pressure ulcers on the foot. The patient states that he developed these after some acute swelling last month. This may have been a result of infection. He comes in today with multiple wounds on his toes dorsal feet and distal lower extremities anteriorly. The exact cause of this is not completely clear. He does not have a prior wound history. He has multiple lower extremity wounds including the left medial foot, left dorsal foot, left second toe, left anterior mid tibia, right dorsal foot, right fourth toe medially and the right medial lower leg. The areas on the left medial foot and left dorsal foot are large superficial wounds most of the rest of these are small punched out looking areas. He does not complain of a lot of pain Past medical history; hypertension, rheumatoid arthritis on Remicade infusions, hyperlipidemia right forefoot surgery on 6/25, he is on Xarelto at this point for reasons that are not totally clear. Arterial studies on 11/28/2018 showed an ABI of 0.98 on the right 1.01 on the left. TBI's of 0.79 on the right 0.75 on the left and triphasic waveforms bilaterally 9/24; wounds on his bilateral lower feet and lower legs. We have been using silver  collagen on the  wounds. 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 Follows with Dr. Raliegh Ip" rheumatology at [facility]. 10/1; patient with wounds on his bilateral lower feet and lower legs we have been using silver collagen. 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 He does not have macrovascular disease by previous noninvasive studies in August. 10/8; patient with wounds on his bilateral lower feet and legs. Small punched out areas. I have no doubt he has chronic venous disease however these wounds do not look like classic venous ulcers. He is generally been making good improvements. 10/15; patients with small wounds on his bilateral lower legs. He has nothing left on the right 3 wounds on the left are making good progress. On the left we have the left medial foot, left anterior tibial area and the larger area on the dorsal left foot in close proximity to the second and third toes. All of these look a lot better. The patient has clear evidence of chronic venous insufficiency although these wounds did not look like venous insufficiency wounds 10/22; patient has no wounds left on the right. He has 3 wounds on the left arm making good progress. Left medial foot left anterior tibia and the left dorsal foot. These are likely related to venous insufficiency. He has dark fibrotic adherent skin in his bilateral ankle areas. 11/5; still no open wounds on the right. Left medial foot and the left dorsal foot just proximal to the toes of the 2 remaining wounds. The area on the left anterior tibia is healed 11/19; 2-week follow-up. The area on the left medial foot is closed. The only remaining wound is on the left dorsal foot just proximal to the toes. The area on the left  anterior tibia also had been previously healed. The patient sees his rheumatologist in early December. I asked him to mention the wounds to the rheumatologist. I had some thoughts about this being rheumatoid vasculitic related and was going to biopsy this although he seems to have done nicely with conservative treatment. The patient has his compression stocking 12/10; 3-week follow-up. He was seen by our nurses in the interim. The only thing he had last time he was here was the opening on the dorsal foot just proximal to the toes. He was wearing a stocking on the right leg. He says in the last week he developed open areas on the medial ankle and medial calf on the right. 05/02/2019; almost 4-week follow-up. He has the original wound in the left dorsal foot which is not a lot better certainly not worse. He had new wounds last time on the right medial ankle medial calf area and on the dorsal fourth toe today.. The latter is the new wound today. The patient probably has chronic venous insufficiency one would have to wonder about rheumatoid vasculitis and if these keep on forming I probably will biopsy one 1/21; the patient has a new wound on the right third toe which probes to bone. We still do not have an exact diagnosis of this. I went ahead and biopsied the right medial leg and the left dorsal foot these were shave biopsieso Vasculitis 2/4; biopsies I did of the right medial lower leg and left dorsal foot showed stasis dermatitis without evidence of vasculitis. Special stain was negative for fungus. Fite stain was  negative for Mycobacterium. We have been using Hydrofera Blue on the wounds on his legs and endoform to the right third toe. Everything looks a little better today Objective Constitutional Sitting or standing Blood Pressure is within target range for patient.. Pulse regular and within target range for patient.Marland Kitchen Respirations regular, non-labored and within target range.. Temperature is  normal and within the target range for the patient.Marland Kitchen Appears in no distress. Vitals Time Taken: 1:21 PM, Height: 63 in, Weight: 162 lbs, BMI: 28.7, Temperature: 98.6 F, Pulse: 87 bpm, Respiratory Rate: 16 breaths/min, Blood Pressure: 132/78 mmHg. Respiratory work of breathing is normal. Cardiovascular Pedal pulses are palpable at both the posterior tibial and dorsalis pedis. General Notes: Wound exam; the patient's wounds are roughly the same although the right third toe looks a better and there is no palpable bone. ooShe has small punched out areas on the right medial lower leg and the right medial ankle. These have some depth. He has a more substantial area on the left dorsal foot which measures 1.7 x 0.9 x 0.2 Integumentary (Hair, Skin) Skin changes definitely suggestive of chronic venous insufficiency with venous inflammation. Wound #10 status is Open. Original cause of wound was Gradually Appeared. The wound is located on the Right,Medial Ankle. The wound measures 0.7cm length x 0.5cm width x 0.2cm depth; 0.275cm^2 area and 0.055cm^3 volume. There is Fat Layer (Subcutaneous Tissue) Exposed exposed. There is no tunneling or undermining noted. There is a small amount of serosanguineous drainage noted. The wound margin is distinct with the outline attached to the wound base. There is large (67-100%) red granulation within the wound bed. There is a small (1-33%) amount of necrotic tissue within the wound bed including Adherent Slough. Wound #11 status is Open. Original cause of wound was Gradually Appeared. The wound is located on the Right Toe Third. The wound measures 0.4cm length x 0.3cm width x 0.1cm depth; 0.094cm^2 area and 0.009cm^3 volume. There is Fat Layer (Subcutaneous Tissue) Exposed exposed. There is no tunneling or undermining noted. There is a small amount of serosanguineous drainage noted. The wound margin is distinct with the outline attached to the wound base. There is  large (67-100%) red granulation within the wound bed. There is no necrotic tissue within the wound bed. Wound #5 status is Open. Original cause of wound was Gradually Appeared. The wound is located on the Left,Dorsal Foot. The wound measures 1.7cm length x 0.9cm width x 0.2cm depth; 1.202cm^2 area and 0.24cm^3 volume. There is Fat Layer (Subcutaneous Tissue) Exposed exposed. There is no tunneling or undermining noted. There is a small amount of serosanguineous drainage noted. The wound margin is distinct with the outline attached to the wound base. There is medium (34-66%) pink granulation within the wound bed. There is a medium (34-66%) amount of necrotic tissue within the wound bed including Adherent Slough. Wound #9 status is Open. Original cause of wound was Gradually Appeared. The wound is located on the Right,Medial Lower Leg. The wound measures 0.6cm length x 0.4cm width x 0.1cm depth; 0.188cm^2 area and 0.019cm^3 volume. There is Fat Layer (Subcutaneous Tissue) Exposed exposed. There is no tunneling or undermining noted. There is a small amount of serosanguineous drainage noted. The wound margin is flat and intact. There is large (67-100%) red granulation within the wound bed. There is a small (1-33%) amount of necrotic tissue within the wound bed including Adherent Slough. 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 - Thursday Dressing Change Frequency: Change  dressing three times week. - twice a week by home health. Skin Barriers/Peri-Wound Care: TCA Cream or Ointment - liberally to both legs, feet, toes, and all wounds in clinic and at home by home health. Wound Cleansing: May shower and wash wound with soap and water. - with dressing changes. Primary Wound Dressing: Wound #10 Right,Medial Ankle: Endoform - moisten with hydrogel or saline. Wound #11 Right Toe Third: Endoform - moisten with hydrogel or saline. Wound #5 Left,Dorsal Foot: Endoform - moisten with hydrogel or saline. Wound #9 Right,Medial Lower Leg: Endoform - moisten with hydrogel or saline. Secondary Dressing: Wound #10 Right,Medial Ankle: Dry Gauze Wound #11 Right Toe Third: Kerlix/Rolled Gauze Dry Gauze Wound #5 Left,Dorsal Foot: Dry Gauze Wound #9 Right,Medial Lower Leg: Dry Gauze Edema Control: 3 Layer Compression System - Bilateral Avoid standing for long periods of time Elevate legs to the level of the heart or above for 30 minutes daily and/or when sitting, a frequency of: - throughout the day. Home Health: [facility] for wound care. - [facility]. 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 1. I am changing all the wound dressings to endoform. 3 layer compression 2. Venous reflux studies bilaterally 3. His vascular exam and his previous arterial studies do not support significant arterial insufficiency 4. Biopsy was negative for rheumatoid vasculitis. He sees his rheumatologist next week Electronic Signature(s) Signed: 05/30/2019 5:50:02 PM By: Linton Ham MD Entered By: Linton Ham on 05/30/2019 14:18:37 -------------------------------------------------------------------------------- SuperBill Details Patient Name: Date of Service: Jonathan [name], Jonathan [name] 05/30/2019 Medical Record [id number] Patient Account  Number: [id number] Date of Birth/Sex: Treating RN: May 10, 1960 (59 y.o. 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 Physician Procedures CPT4 Code Description: [id number] - WC PHYS LEVEL 3 - EST PT ICD-10 Diagnosis Description L97.521 Non-pressure chronic ulcer of other part of left foot limit L97.821 Non-pressure chronic ulcer of other part of left lower leg skin I87.331 Chronic  venous hypertension (idiopathic) with ulcer and inf lower extremity L97.311 Non-pressure chronic ulcer of right ankle limited to breakd Modifier: ed to breakdown limited to break lammation of rig own of skin Quantity: 1 of skin down of ht Electronic Signature(s) Signed: 05/30/2019 5:50:02 PM By: Linton Ham MD Signed:  05/30/2019 5:56:18 PM By: Deon Pilling Entered By: Deon Pilling on 05/30/2019 15:01:25

## 2019-06-04 NOTE — Progress Notes (Signed)
RAMONDO, SPIGELMYER ([id number]) Visit Report for 05/30/2019 Arrival Information Details Patient Name: Date of Service: Jonathan [name], Jonathan [name] 05/30/2019 1:00 PM Medical Record [id number] Patient Account Number: [id number] Date of Birth/Sex: Treating RN: 25-Oct-1960 (59 y.o. [name] Primary Care Ysidra Sopher: [facility] Other Clinician: Referring Shanequa Whitenight: Treating Emma Birchler/Extender:Robson, Gustavo Lah, Cynda Familia in Treatment: 20 Visit Information History Since Last Visit Added or deleted any medications: No Patient Arrived: Crutches Any new allergies or adverse reactions: No Arrival Time: 13:19 Had a fall or experienced change in No Accompanied By: alone activities of daily living that may affect Transfer Assistance: None risk of falls: Patient Identification Verified: Yes Signs or symptoms of abuse/neglect since last No Secondary Verification Process Yes visito Completed: Hospitalized since last visit: No Patient Requires Transmission- No Implantable device outside of the clinic excluding No Based Precautions: cellular tissue based products placed in the center Patient Has Alerts: Yes since last visit: Patient Alerts: Patient on Blood Has Dressing in Place as Prescribed: Yes Thinner Has Compression in Place as Prescribed: Yes ABI Right .98 Pain Present Now: Yes ABI Left 1.01 Electronic Signature(s) Signed: 06/04/2019 5:30:42 PM By: [name] RN, BSN Entered By: [name] on 05/30/2019 13:20:09 -------------------------------------------------------------------------------- Compression Therapy Details Patient Name: Date of Service: Jonathan [name], Jonathan [name] 05/30/2019 1:00 PM Medical Record [id number] Patient Account Number: [id number] Date of Birth/Sex: Treating RN: January 21, 1961 (59 y.o. [name] Primary Care Mileigh Tilley: [facility] Other Clinician: Referring Che Below: Treating Oneal Biglow/Extender:Robson, Gustavo Lah, Cynda Familia in Treatment:  20 Compression Therapy Performed for Wound Wound #10 Right,Medial Ankle Assessment: Performed By: Clinician Baruch Gouty, RN Compression Type: Three Layer Pre Treatment ABI: 1 Post Procedure Diagnosis Same as Pre-procedure Electronic Signature(s) Signed: 05/30/2019 5:56:18 PM By: Deon Pilling Entered By: Deon Pilling on 05/30/2019 14:15:25 -------------------------------------------------------------------------------- Compression Therapy Details Patient Name: Date of Service: Jonathan [name], Jonathan [name] 05/30/2019 1:00 PM Medical Record [id number] Patient Account Number: [id number] Date of Birth/Sex: Treating RN: 08/30/1960 (59 y.o. [name] Primary Care Payden Docter: [facility] Other Clinician: Referring Jamilee Lafosse: Treating Damario Gillie/Extender:Robson, Gustavo Lah, Cynda Familia in Treatment: 20 Compression Therapy Performed for Wound Wound #9 Right,Medial Lower Leg Assessment: Performed By: Clinician Baruch Gouty, RN Compression Type: Three Layer Pre Treatment ABI: 1 Post Procedure Diagnosis Same as Pre-procedure Electronic Signature(s) Signed: 05/30/2019 5:56:18 PM By: Deon Pilling Entered By: Deon Pilling on 05/30/2019 14:15:26 -------------------------------------------------------------------------------- Compression Therapy Details Patient Name: Date of Service: Jonathan [name], Jonathan [name] 05/30/2019 1:00 PM Medical Record [id number] Patient Account Number: [id number] Date of Birth/Sex: Treating RN: 1960-11-09 (59 y.o. [name] Primary Care Laakea Pereira: [facility] Other Clinician: Referring Nella Botsford: Treating Kalin Amrhein/Extender:Robson, Gustavo Lah, Cynda Familia in Treatment: 20 Compression Therapy Performed for Wound Wound #5 Left,Dorsal Foot Assessment: Performed By: Clinician Baruch Gouty, RN Compression Type: Three Layer Pre Treatment ABI: 1 Post Procedure Diagnosis Same as Pre-procedure Electronic Signature(s) Signed: 05/30/2019 5:56:18 PM By:  Deon Pilling Entered By: Deon Pilling on 05/30/2019 14:15:26 -------------------------------------------------------------------------------- Lower Extremity Assessment Details Patient Name: Date of Service: Jonathan [name], Jonathan [name] 05/30/2019 1:00 PM Medical Record [id number] Patient Account Number: [id number] Date of Birth/Sex: Treating RN: 05/20/1960 (59 y.o. [name] Primary Care Lacrisha Bielicki: [facility] Other Clinician: Referring Milos Milligan: Treating Alexei Doswell/Extender:Robson, Gustavo Lah, Cynda Familia in Treatment: 20 Edema Assessment Assessed: [Left: No] [Right: No] Edema: [Left: Yes] [Right: Yes] Calf Left: Right: Point of Measurement: 36 cm From Medial Instep 27.5 cm 29 cm Ankle Left: Right: Point of Measurement: 8 cm From Medial Instep 21 cm 21.5 cm Vascular Assessment Pulses: Dorsalis Pedis Palpable: [Left:Yes] [Right:Yes] Electronic Signature(s) Signed:  06/04/2019 5:30:42 PM By: [name] RN, BSN Entered By: [name] on 05/30/2019 13:26:27 -------------------------------------------------------------------------------- Multi Wound Chart Details Patient Name: Date of Service: Jonathan [name], Jonathan [name] 05/30/2019 1:00 PM Medical Record [id number] Patient Account Number: [id number] Date of Birth/Sex: Treating RN: Apr 11, 1961 (59 y.o. M) Primary Care Gwendolyn Nishi: [facility] Other Clinician: Referring Maanav Kassabian: Treating Dayra Rapley/Extender:Robson, Gustavo Lah, LORI Weeks in Treatment: 20 Vital Signs Height(in): 63 Pulse(bpm): 87 Weight(lbs): 162 Blood Pressure(mmHg): 132/78 Body Mass Index(BMI): 29 Temperature(F): 98.6 Respiratory 16 Rate(breaths/min): Photos: [10:No Photos] [11:No Photos] [5:No Photos] Wound Location: [10:Right Ankle - Medial] [11:Right Toe Third] [5:Left Foot - Dorsal] Wounding Event: [10:Gradually Appeared] [11:Gradually Appeared] [5:Gradually Appeared] Primary Etiology: [10:Inflammatory] [11:Inflammatory]  [5:Inflammatory] Comorbid History: [10:Rheumatoid Arthritis, Osteoarthritis] [11:Rheumatoid Arthritis, Osteoarthritis] [5:Rheumatoid Arthritis, Osteoarthritis] Date Acquired: [10:03/28/2019] [11:05/01/2019] [5:11/24/2018] Weeks of Treatment: [10:8] [11:4] [5:20] Wound Status: [10:Open] [11:Open] [5:Open] Measurements L x W x D 0.7x0.5x0.2 [11:0.4x0.3x0.1] [5:1.7x0.9x0.2] (cm) Area (cm) : [10:0.275] [11:0.094] [5:1.202] Volume (cm) : [10:0.055] [11:0.009] [5:0.24] % Reduction in Area: [10:85.00%] [11:88.00%] [5:87.40%] % Reduction in Volume: 70.10% [11:88.60%] [5:74.90%] Classification: [10:Full Thickness Without Exposed Support Structures Exposed Support Structures Exposed Support Structures] [11:Full Thickness With] [5:Full Thickness Without] Exudate Amount: [10:Small] [11:Small] [5:Small] Exudate Type: [10:Serosanguineous] [11:Serosanguineous] [5:Serosanguineous] Exudate Color: [10:red, brown] [11:red, brown] [5:red, brown] Wound Margin: [10:Distinct, outline attached Distinct, outline attached Distinct, outline attached] Granulation Amount: [10:Large (67-100%)] [11:Large (67-100%)] [5:Medium (34-66%)] Granulation Quality: [10:Red] [11:Red] [5:Pink] Necrotic Amount: [10:Small (1-33%)] [11:None Present (0%)] [5:Medium (34-66%)] Exposed Structures: [10:Fat Layer (Subcutaneous Fat Layer (Subcutaneous Fat Layer (Subcutaneous Tissue) Exposed: Yes Fascia: No Tendon: No Muscle: No Joint: No Bone: No] [11:Tissue) Exposed: Yes Fascia: No Tendon: No Muscle: No Joint: No Bone: No] [5:Tissue) Exposed: Yes  Fascia: No Tendon: No Muscle: No Joint: No Bone: No] Epithelialization: [10:Small (1-33%) 9] [11:Large (67-100%) N/A] [5:Small (1-33%) N/A] Photos: [10:No Photos] [11:N/A] [5:N/A] Wound Location: [10:Right Lower Leg - Medial] [11:N/A] [5:N/A] Wounding Event: [10:Gradually Appeared] [11:N/A] [5:N/A] Primary Etiology: [10:Inflammatory] [11:N/A] [5:N/A] Comorbid History: [10:Rheumatoid Arthritis,  Osteoarthritis] [11:N/A] [5:N/A] Date Acquired: [10:03/28/2019] [11:N/A] [5:N/A] Weeks of Treatment: [10:8] [11:N/A] [5:N/A] Wound Status: [10:Open] [11:N/A] [5:N/A] Measurements L x W x D 0.6x0.4x0.1 [11:N/A] [5:N/A] (cm) Area (cm) : [10:0.188] [11:N/A] [5:N/A] Volume (cm) : [10:0.019] [11:N/A] [5:N/A] % Reduction in Area: [10:80.00%] [11:N/A] [5:N/A] % Reduction in Volume: [10:79.80%] [11:N/A] [5:N/A] Classification: [10:Full Thickness Without Exposed Support Structures] [11:N/A] [5:N/A] Exudate Amount: [10:Small] [11:N/A] [5:N/A] Exudate Type: [10:Serosanguineous] [11:N/A] [5:N/A] Exudate Color: [10:red, brown] [11:N/A] [5:N/A] Wound Margin: [10:Flat and Intact] [11:N/A] [5:N/A] Granulation Amount: [10:Large (67-100%)] [11:N/A] [5:N/A] Granulation Quality: [10:Red] [11:N/A] [5:N/A] Necrotic Amount: [10:Small (1-33%)] [11:N/A] [5:N/A] Exposed Structures: [10:Fat Layer (Subcutaneous N/A Tissue) Exposed: Yes Fascia: No Tendon: No Muscle: No Joint: No Bone: No Medium (34-66%)] [11:N/A] [5:N/A N/A] Treatment Notes Electronic Signature(s) Signed: 05/30/2019 5:50:02 PM By: Linton Ham MD Entered By: Linton Ham on 05/30/2019 14:11:02 -------------------------------------------------------------------------------- [place] Details Patient Name: Date of Service: Jonathan [name], Jonathan [name] 05/30/2019 1:00 PM Medical Record [id number] Patient Account Number: [id number] Date of Birth/Sex: Treating RN: [birth year] (59 y.o. [name] Primary Care Calib Wadhwa: [facility] Other Clinician: Referring Suzie Vandam: Treating Onofrio Klemp/Extender:Robson, Gustavo Lah, Cynda Familia in Treatment: 20 Active Inactive Nutrition Nursing Diagnoses: Potential for alteratiion in Nutrition/Potential for imbalanced nutrition Goals: Patient/caregiver agrees to and verbalizes understanding of need to obtain nutritional consultation Date Initiated: 01/10/2019 Target Resolution Date:  06/28/2019 Goal Status: Active Interventions: Provide education on nutrition Treatment Activities: Education provided on Nutrition : 05/16/2019 Patient referred to Primary Care Physician for further nutritional evaluation :  01/10/2019 Notes: Electronic Signature(s) Signed: 05/30/2019 5:56:18 PM By: Deon Pilling Entered By: Deon Pilling on 05/30/2019 15:00:46 -------------------------------------------------------------------------------- Pain Assessment Details Patient Name: Date of Service: Jonathan [name], Jonathan [name] 05/30/2019 1:00 PM Medical Record [id number] Patient Account Number: [id number] Date of Birth/Sex: Treating RN: Oct 16, 1960 (59 y.o. [name] Primary Care Chole Driver: [facility] Other Clinician: Referring Byron Peacock: Treating Shanita Kanan/Extender:Robson, Gustavo Lah, Cynda Familia in Treatment: 20 Active Problems Location of Pain Severity and Description of Pain Patient Has Paino Yes Site Locations Pain Location: Pain in Ulcers With Dressing Change: Yes Duration of the Pain. Constant / Intermittento Intermittent Rate the pain. Current Pain Level: 6 Character of Pain Describe the Pain: Aching Pain Management and Medication Current Pain Management: Medication: Yes Cold Application: No Rest: No Massage: No Activity: No T.E.N.S.: No Heat Application: No Leg drop or elevation: No Is the Current Pain Management Adequate: Adequate How does your wound impact your activities of daily livingo Sleep: No Bathing: No Appetite: No Relationship With Others: No Bladder Continence: No Emotions: No Bowel Continence: No Work: No Toileting: No Drive: No Dressing: No Hobbies: No Electronic Signature(s) Signed: 06/04/2019 5:30:42 PM By: [name] RN, BSN Entered By: [name] on 05/30/2019 13:22:20 -------------------------------------------------------------------------------- Patient/Caregiver Education Details Patient Name: Date of Service: Jani Files 2/4/2021andnbsp1:00 PM Medical Record [id number] Patient Account Number: [id number] Date of Birth/Gender: Treating RN: 06-Mar-1961 (58 y.o. [name] Primary Care Physician: [facility] Other Clinician: Referring Physician: Treating Physician/Extender:Robson, Gustavo Lah, Cynda Familia in Treatment: 20 Education Assessment Education Provided To: Patient Education Topics Provided Wound/Skin Impairment: Handouts: Skin Care Do's and Dont's Methods: Explain/Verbal Responses: Reinforcements needed Electronic Signature(s) Signed: 05/30/2019 5:56:18 PM By: Deon Pilling Entered By: Deon Pilling on 05/30/2019 15:00:58 -------------------------------------------------------------------------------- Wound Assessment Details Patient Name: Date of Service: Jonathan [name], Jonathan [name] 05/30/2019 1:00 PM Medical Record [id number] Patient Account Number: [id number] Date of Birth/Sex: Treating RN: 1961-02-23 (59 y.o. M) Primary Care Hasana Alcorta: [facility] Other Clinician: Referring Shandon Matson: Treating Vernis Cabacungan/Extender:Robson, Gustavo Lah, LORI Weeks in Treatment: 20 Wound Status Wound Number: 10 Primary Etiology: Inflammatory Wound Location: Right, Medial Ankle Wound Status: Open Wounding Event: Gradually Appeared Comorbid History: Rheumatoid Arthritis, Osteoarthritis Date Acquired: 03/28/2019 Weeks Of Treatment: 8 Clustered Wound: No Photos Wound Measurements Length: (cm) 0.7 % Reduct Width: (cm) 0.5 % Reduct Depth: (cm) 0.2 Epitheli Area: (cm) 0.275 Tunneli Volume: (cm) 0.055 Undermi Wound Description Classification: Full Thickness Without Exposed Support Foul Odo Structures Slough/F Wound Distinct, outline attached Margin: Exudate Small Amount: Exudate Serosanguineous Type: Exudate red, brown Color: Wound Bed Granulation Amount: Large (67-100%) Granulation Quality: Red Fascia E Necrotic Amount: Small (1-33%) Fat Laye Necrotic Quality: Adherent  Slough Tendon E Muscle E Joint Ex Bone Exp r After Cleansing: No ibrino Yes Exposed Structure xposed: No r (Subcutaneous Tissue) Exposed: Yes xposed: No xposed: No posed: No osed: No ion in Area: 85% ion in Volume: 70.1% alization: Small (1-33%) ng: No ning: No Electronic Signature(s) Signed: 05/31/2019 4:37:46 PM By: Mikeal Hawthorne EMT/HBOT Entered By: Mikeal Hawthorne on 05/31/2019 14:53:04 -------------------------------------------------------------------------------- Wound Assessment Details Patient Name: Date of Service: Jonathan [name], Jonathan [name] 05/30/2019 1:00 PM Medical Record [id number] Patient Account Number: [id number] Date of Birth/Sex: Treating RN: [birth year] (59 y.o. M) Primary Care Kyland No: [facility] Other Clinician: Referring Caisley Baxendale: Treating Aramis Weil/Extender:Robson, Gustavo Lah, LORI Weeks in Treatment: 20 Wound Status Wound Number: 11 Primary Etiology: Inflammatory Wound Location: Right Toe Third Wound Status: Open Wounding Event: Gradually Appeared Comorbid History: Rheumatoid Arthritis, Osteoarthritis Date Acquired: 05/01/2019 Weeks Of Treatment: 4 Clustered Wound: No Photos Wound Measurements  Length: (cm) 0.4 % Reductio Width: (cm) 0.3 % Reductio Depth: (cm) 0.1 Epithelial Area: (cm) 0.094 Tunneling Volume: (cm) 0.009 Undermini Wound Description Full Thickness With Exposed Support Foul Odor Classification: Structures Slough/Fi Wound Distinct, outline attached Margin: Exudate Small Amount: Exudate Type: Serosanguineous Exudate Color: red, brown Wound Bed Granulation Amount: Large (67-100%) Granulation Quality: Red Fascia Exp Necrotic Amount: None Present (0%) Fat Layer Tendon Exp Muscle Exp Joint Expo Bone Expos After Cleansing: No brino No Exposed Structure osed: No (Subcutaneous Tissue) Exposed: Yes osed: No osed: No sed: No ed: No n in Area: 88% n in Volume: 88.6% ization: Large (67-100%) : No ng: No Electronic  Signature(s) Signed: 05/31/2019 4:37:46 PM By: Mikeal Hawthorne EMT/HBOT Entered By: Mikeal Hawthorne on 05/31/2019 14:53:30 -------------------------------------------------------------------------------- Wound Assessment Details Patient Name: Date of Service: Jonathan [name], Jonathan [name] 05/30/2019 1:00 PM Medical Record [id number] Patient Account Number: [id number] Date of Birth/Sex: Treating RN: [birth year] (58 y.o. M) Primary Care Ervie Mccard: [facility] Other Clinician: Referring Kazuma Elena: Treating Jalin Erpelding/Extender:Robson, Gustavo Lah, LORI Weeks in Treatment: 20 Wound Status Wound Number: 5 Primary Etiology: Inflammatory Wound Location: Left Foot - Dorsal Wound Status: Open Wounding Event: Gradually Appeared Comorbid History: Rheumatoid Arthritis, Osteoarthritis Date Acquired: 11/24/2018 Weeks Of Treatment: 20 Clustered Wound: No Photos Wound Measurements Length: (cm) 1.7 % Reduct Width: (cm) 0.9 % Reduct Depth: (cm) 0.2 Epitheli Area: (cm) 1.202 Tunneli Volume: (cm) 0.24 Undermi Wound Description Full Thickness Without Exposed Support Foul Odo Classification: Structures Slough/F Wound Distinct, outline attached Margin: Exudate Small Amount: Exudate Serosanguineous Type: Exudate red, brown Color: Wound Bed Granulation Amount: Medium (34-66%) Granulation Quality: Pink Fascia E Necrotic Amount: Medium (34-66%) Fat Laye Necrotic Quality: Adherent Slough Tendon E Muscle E Joint Ex Bone Exp Electronic Signature(s) Signed: 05/31/2019 4:37:46 PM By: Mikeal Hawthorne EMT/HBOT Entered By: Mikeal Hawthorne on 02/05 r After Cleansing: No ibrino Yes Exposed Structure xposed: No r (Subcutaneous Tissue) Exposed: Yes xposed: No xposed: No posed: No osed: No /2021 14:51:30 ion in Area: 87.4% ion in Volume: 74.9% alization: Small (1-33%) ng: No ning: No -------------------------------------------------------------------------------- Wound Assessment Details Patient  Name: Date of Service: Jonathan [name], Jonathan [name] 05/30/2019 1:00 PM Medical Record [id number] Patient Account Number: [id number] Date of Birth/Sex: Treating RN: 01-11-1961 (59 y.o. M) Primary Care Alven Alverio: [facility] Other Clinician: Referring Marc Sivertsen: Treating Jazzie Trampe/Extender:Robson, Gustavo Lah, LORI Weeks in Treatment: 20 Wound Status Wound Number: 9 Primary Etiology: Inflammatory Wound Location: Right Lower Leg - Medial Wound Status: Open Wounding Event: Gradually Appeared Comorbid History: Rheumatoid Arthritis, Osteoarthritis Date Acquired: 03/28/2019 Weeks Of Treatment: 8 Clustered Wound: No Photos Wound Measurements Length: (cm) 0.6 % Reduct Width: (cm) 0.4 % Reduct Depth: (cm) 0.1 Epitheli Area: (cm) 0.188 Tunneli Volume: (cm) 0.019 Undermi Wound Description Classification: Full Thickness Without Exposed Support Foul Odo Structures Slough/F Wound Flat and Intact Margin: Exudate Small Amount: Exudate Serosanguineous Type: Exudate red, brown Color: Wound Bed Granulation Amount: Large (67-100%) Granulation Quality: Red Fascia E Necrotic Amount: Small (1-33%) Fat Laye Necrotic Quality: Adherent Slough Tendon E Muscle E Joint Ex Bone Exp r After Cleansing: No ibrino Yes Exposed Structure xposed: No r (Subcutaneous Tissue) Exposed: Yes xposed: No xposed: No posed: No osed: No ion in Area: 80% ion in Volume: 79.8% alization: Medium (34-66%) ng: No ning: No Electronic Signature(s) Signed: 05/31/2019 4:37:46 PM By: Mikeal Hawthorne EMT/HBOT Entered By: Mikeal Hawthorne on 05/31/2019 14:52:24 -------------------------------------------------------------------------------- Vitals Details Patient Name: Date of Service: Jonathan [name], LOEBER 05/30/2019 1:00 PM Medical Record [id number] Patient Account Number: [id number] Date of Birth/Sex: Treating RN: [birth year] (59 y.o.  [name] Primary Care Odie Edmonds: [facility] Other Clinician: Referring  Kacia Halley: Treating Breauna Mazzeo/Extender:Robson, Gustavo Lah, Cynda Familia in Treatment: 20 Vital Signs Time Taken: 13:21 Temperature (F): 98.6 Height (in): 63 Pulse (bpm): 87 Weight (lbs): 162 Respiratory Rate (breaths/min): 16 Body Mass Index (BMI): 28.7 Blood Pressure (mmHg): 132/78 Reference Range: 80 - 120 mg / dl Electronic Signature(s) Signed: 06/04/2019 5:30:42 PM By: [name] RN, BSN Entered By: [name] on 05/30/2019 13:21:57

## 2019-06-13 ENCOUNTER — Encounter (HOSPITAL_BASED_OUTPATIENT_CLINIC_OR_DEPARTMENT_OTHER): Payer: Medicare Other | Admitting: Internal Medicine

## 2019-06-19 ENCOUNTER — Telehealth (HOSPITAL_COMMUNITY): Payer: Self-pay | Admitting: *Deleted

## 2019-06-19 NOTE — Telephone Encounter (Signed)
Per review of Christy Hall's notes:  Christy left pt VM on 05/31/2019 8:07 am 06/07/2019 2nd vm left at 10:17 am  Alyse Low called Dustin Flock at office on 06/18/2019 to verify phone number.

## 2019-06-20 ENCOUNTER — Encounter (HOSPITAL_BASED_OUTPATIENT_CLINIC_OR_DEPARTMENT_OTHER): Payer: Medicare Other | Admitting: Internal Medicine

## 2019-06-25 ENCOUNTER — Encounter (HOSPITAL_COMMUNITY): Payer: Medicare Other

## 2019-06-25 ENCOUNTER — Other Ambulatory Visit (HOSPITAL_COMMUNITY): Payer: Self-pay | Admitting: Internal Medicine

## 2019-06-25 ENCOUNTER — Ambulatory Visit (HOSPITAL_COMMUNITY)
Admission: RE | Admit: 2019-06-25 | Discharge: 2019-06-25 | Disposition: A | Discharge status: Home / Self Care | Payer: Medicare Other | Source: Ambulatory Visit | Attending: Surgery | Admitting: Surgery

## 2019-06-25 ENCOUNTER — Other Ambulatory Visit: Payer: Self-pay

## 2019-06-25 DIAGNOSIS — I83813 Varicose veins of bilateral lower extremities with pain: Secondary | ICD-10-CM | POA: Insufficient documentation

## 2019-06-27 ENCOUNTER — Other Ambulatory Visit: Payer: Self-pay

## 2019-06-27 ENCOUNTER — Encounter (HOSPITAL_BASED_OUTPATIENT_CLINIC_OR_DEPARTMENT_OTHER): Payer: Medicare Other | Attending: Internal Medicine | Admitting: Internal Medicine

## 2019-06-27 DIAGNOSIS — L97821 Non-pressure chronic ulcer of other part of left lower leg limited to breakdown of skin: Secondary | ICD-10-CM | POA: Insufficient documentation

## 2019-06-27 DIAGNOSIS — L97311 Non-pressure chronic ulcer of right ankle limited to breakdown of skin: Secondary | ICD-10-CM | POA: Insufficient documentation

## 2019-06-27 DIAGNOSIS — L97211 Non-pressure chronic ulcer of right calf limited to breakdown of skin: Secondary | ICD-10-CM | POA: Diagnosis not present

## 2019-06-27 DIAGNOSIS — Z7901 Long term (current) use of anticoagulants: Secondary | ICD-10-CM | POA: Diagnosis not present

## 2019-06-27 DIAGNOSIS — L97521 Non-pressure chronic ulcer of other part of left foot limited to breakdown of skin: Secondary | ICD-10-CM | POA: Diagnosis not present

## 2019-06-27 DIAGNOSIS — E785 Hyperlipidemia, unspecified: Secondary | ICD-10-CM | POA: Diagnosis not present

## 2019-06-27 DIAGNOSIS — I872 Venous insufficiency (chronic) (peripheral): Secondary | ICD-10-CM | POA: Diagnosis not present

## 2019-06-27 DIAGNOSIS — I890 Lymphedema, not elsewhere classified: Secondary | ICD-10-CM | POA: Diagnosis not present

## 2019-06-27 DIAGNOSIS — M059 Rheumatoid arthritis with rheumatoid factor, unspecified: Secondary | ICD-10-CM | POA: Insufficient documentation

## 2019-06-27 DIAGNOSIS — I10 Essential (primary) hypertension: Secondary | ICD-10-CM | POA: Diagnosis not present

## 2019-06-27 DIAGNOSIS — I776 Arteritis, unspecified: Secondary | ICD-10-CM | POA: Diagnosis not present

## 2019-06-27 NOTE — Progress Notes (Signed)
KAHEEM, VENHAUS ([id number]) Visit Report for 06/27/2019 HPI Details Patient Name: Date of Service: MUAZ, Jonathan [name] 06/27/2019 1:00 PM Medical Record [id number] Patient Account Number: [id number] Date of Birth/Sex: Treating RN: [birth year] (59 y.o. [name] Primary Care Provider: Sinclair Ship Other Clinician: Referring Provider: Treating Provider/Extender:Kehinde Totzke, Gustavo Lah, Cynda Familia in Treatment: 24 History of Present Illness HPI Description: ADMISSION 01/10/2019 This is a 59 year old man who has rheumatoid arthritis on Remicade. He is not a diabetic. He had forefoot surgery by Dr. Bettye Boeck on June 25 for correction of hammertoes. At some point in the postop follow-up he presented with multiple wounds on the left foot. I do not really have a good description at this point but I will try to look through [facility] health link. He had arterial studies on 11/28/2018 that were within normal limits. Seen by podiatry on 8/24 with multiple left foot and lower extremity ulcers. He was treated with Bactrim and Cipro. He was hospitalized from 8/28 through 8/30 with wounds on the left anterior foot. An MRI was negative at that point the wounds were on the left anterior and left medial foot. Felt to have cellulitis. He saw his primary doctor in follow-up on 9/3 and felt to have pressure ulcers on the foot. The patient states that he developed these after some acute swelling last month. This may have been a result of infection. He comes in today with multiple wounds on his toes dorsal feet and distal lower extremities anteriorly. The exact cause of this is not completely clear. He does not have a prior wound history. He has multiple lower extremity wounds including the left medial foot, left dorsal foot, left second toe, left anterior mid tibia, right dorsal foot, right fourth toe medially and the right medial lower leg. The areas on the left medial foot and left dorsal foot are large  superficial wounds most of the rest of these are small punched out looking areas. He does not complain of a lot of pain Past medical history; hypertension, rheumatoid arthritis on Remicade infusions, hyperlipidemia right forefoot surgery on 6/25, he is on Xarelto at this point for reasons that are not totally clear. Arterial studies on 11/28/2018 showed an ABI of 0.98 on the right 1.01 on the left. TBI's of 0.79 on the right 0.75 on the left and triphasic waveforms bilaterally 9/24; wounds on his bilateral lower feet and lower legs. We have been using silver collagen on the wounds. 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 Follows with Dr. Raliegh Ip" rheumatology at [facility]. 10/1; patient with wounds on his bilateral lower feet and lower legs we have been using silver collagen. 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 He does not have macrovascular disease by previous noninvasive studies in August. 10/8; patient with wounds on his bilateral lower feet and legs. Small punched out areas. I have no doubt he has chronic venous disease however these wounds do not look like classic venous ulcers. He is generally been making good improvements. 10/15; patients with small wounds on his bilateral lower legs. He has nothing left on the right 3 wounds on the left are making good progress. On the left we have the left medial foot, left anterior tibial area and the larger area on the dorsal left foot in close proximity to the second  and third toes. All of these look a lot better. The patient has clear evidence of chronic venous insufficiency although these wounds did not look like venous insufficiency wounds 10/22; patient has no wounds left on the right. He has 3 wounds on the left  arm making good progress. Left medial foot left anterior tibia and the left dorsal foot. These are likely related to venous insufficiency. He has dark fibrotic adherent skin in his bilateral ankle areas. 11/5; still no open wounds on the right. Left medial foot and the left dorsal foot just proximal to the toes of the 2 remaining wounds. The area on the left anterior tibia is healed 11/19; 2-week follow-up. The area on the left medial foot is closed. The only remaining wound is on the left dorsal foot just proximal to the toes. The area on the left anterior tibia also had been previously healed. The patient sees his rheumatologist in early December. I asked him to mention the wounds to the rheumatologist. I had some thoughts about this being rheumatoid vasculitic related and was going to biopsy this although he seems to have done nicely with conservative treatment. The patient has his compression stocking 12/10; 3-week follow-up. He was seen by our nurses in the interim. The only thing he had last time he was here was the opening on the dorsal foot just proximal to the toes. He was wearing a stocking on the right leg. He says in the last week he developed open areas on the medial ankle and medial calf on the right. 05/02/2019; almost 4-week follow-up. He has the original wound in the left dorsal foot which is not a lot better certainly not worse. He had new wounds last time on the right medial ankle medial calf area and on the dorsal fourth toe today.. The latter is the new wound today. The patient probably has chronic venous insufficiency one would have to wonder about rheumatoid vasculitis and if these keep on forming I probably will biopsy one 1/21; the patient has a new wound on the right third toe which probes to bone. We still do not have an exact diagnosis of this. I went ahead and biopsied the right medial leg and the left dorsal foot these were shave biopsieso Vasculitis 2/4; biopsies I  did of the right medial lower leg and left dorsal foot showed stasis dermatitis without evidence of vasculitis. Special stain was negative for fungus. Fite stain was negative for Mycobacterium. We have been using Hydrofera Blue on the wounds on his legs and endoform to the right third toe. Everything looks a little better today 3/4; since the patient was last here he had his reflux studies. These showed no evidence of DVT. On the right there was reflux in the right greater saphenous vein right common femoral vein right saphenofemoral junction right popliteal vein. 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 On the left no evidence of a DVT there is no evidence of superficial venous reflux seen in the left small saphenous vein there was venous reflux noted in the left common femoral vein the left greater saphenous vein in the proximal thigh. I did not see much description of venous reflux in the lower extremities although he clearly has chronic venous changes in the skin in his lower legs. He also saw rheumatology at [facility] on 06/12/2019. They noted the nonhealing wounds in his lower extremities. Because of this they held his Remicade infusions. Started him on prednisone 10 mg a day I believe  5 twice daily. He was to return in 2 months. They did document normal arterial studies in August, the biopsy in 06-17-2022 was negative for vasculitis fungi or Mycobacterium. His findings were suggestive of stasis dermatitis He has multiple small wounds bilaterally in color including the right medial lower extremity, right third toe, left medial lower leg left dorsal foot. He has a new area on the right medial calf. These are all very small punched out areas that do not have the appearance of venous wounds. As noted previous biopsy we did in this clinic was negative for vasculitis/rheumatoid vasculitis Electronic Signature(s) Signed: 06/27/2019 5:41:03 PM By: Linton Ham MD Entered By: Linton Ham  on 06/27/2019 13:59:06 -------------------------------------------------------------------------------- Physical Exam Details Patient Name: Date of Service: NEZIAH, MATHIEU 06/27/2019 1:00 PM Medical Record [id number] Patient Account Number: [id number] Date of Birth/Sex: Treating RN: [birth year] (59 y.o. [name] Primary Care Provider: Sinclair Ship Other Clinician: Referring Provider: Treating Provider/Extender:Keywon Mestre, Gustavo Lah, Cynda Familia in Treatment: 24 Constitutional Sitting or standing Blood Pressure is within target range for patient.. Pulse regular and within target range for patient.Marland Kitchen Respirations regular, non-labored and within target range.Marland Kitchen Appears in no distress. Notes Wound exam; the patient has several small wounds including a new one on the right medial lower leg although these are previously quoted in the HPI. They all have reasonably healthy surfaces there is small not superficial. He clearly has changes of hemosiderin deposition chronic venous insufficiency but these wounds do not look like venous insufficiency wounds Electronic Signature(s) Signed: 06/27/2019 5:41:03 PM By: Linton Ham MD Entered By: Linton Ham on 06/27/2019 14:01:11 -------------------------------------------------------------------------------- Physician Orders Details Patient Name: Date of Service: INEZ, SAYLES 06/27/2019 1:00 PM Medical Record [id number] Patient Account Number: [id number] Date of Birth/Sex: Treating RN: [birth year] (59 y.o. [name] Primary Care Provider: Sinclair Ship Other Clinician: Referring Provider: Treating Provider/Extender:Karimah Winquist, Gustavo Lah, Cynda Familia in Treatment: 24 Verbal / Phone Orders: No Diagnosis Coding ICD-10 Coding Code Description L97.521 Non-pressure chronic ulcer of other part of left foot limited to breakdown of skin L97.821 Non-pressure chronic ulcer of other part of left lower leg limited to breakdown  of skin I87.331 Chronic venous hypertension (idiopathic) with ulcer and inflammation of right lower extremity M05.80 Other rheumatoid arthritis with rheumatoid factor of unspecified site L97.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 - Thursday Dressing Change Frequency Change dressing three times week. - twice a week by home health. Skin Barriers/Peri-Wound Care TCA Cream or Ointment - liberally to both legs, feet, toes, and all wounds in clinic and at home by home health. Wound Cleansing May shower and wash wound with soap and water. - with dressing changes. Primary Wound Dressing Wound #10 Right,Medial Ankle Calcium Alginate with Silver Wound #11 Right Toe Third Calcium Alginate with Silver Wound #12 Left,Medial Lower Leg Calcium Alginate with Silver Wound #5 Left,Dorsal Foot Calcium Alginate with Silver Wound #9 Right,Medial Lower Leg Calcium Alginate with Silver Secondary Dressing Wound #10 Right,Medial Ankle Dry Gauze Wound #11 Right Toe Third Kerlix/Rolled Gauze Dry Gauze Wound #5 Left,Dorsal Foot Dry Gauze Wound #9 Right,Medial Lower Leg Dry Gauze Edema Control 3 Layer Compression System - Bilateral Avoid standing for long periods of time Elevate legs to the level of the heart or above for 30 minutes daily and/or when sitting, a frequency of: - throughout the day. [place] skilled  nursing for wound care. - [facility]. Consults Vascular - Vascular consult with [facility] related to venous insufficiency. - (ICD10 I87.331 - Chronic venous hypertension (idiopathic) with ulcer and inflammation of right lower extremity) Podiatry - Podiatry referral for nail clipping. - (ICD10 I87.331 - Chronic venous hypertension (idiopathic) with ulcer and inflammation of right lower  extremity) Electronic Signature(s) Signed: 06/27/2019 5:41:03 PM By: Linton Ham MD Signed: 06/27/2019 6:05:23 PM By: Deon Pilling Entered By: Deon Pilling on 06/27/2019 13:34:15 -------------------------------------------------------------------------------- Prescription 06/27/2019 Patient Name: Jani Files Provider: Linton Ham MD Date of Birth: 20-Oct-1960 NPI#: [id number] Sex: M DEA#: [id number] Phone #: [phone] License #: [id number] Patient Address: [place] [address] [place], [place] [address] [place], [place] [address] [phone] Allergies No Known Allergies Provider's Orders Vascular - ICD10: [id number] - Vascular consult with [facility] related to venous insufficiency. Signature(s): Date(s): Prescription 06/27/2019 Patient Name: Jani Files Provider: Linton Ham MD Date of Birth: 1960/05/14 NPI#: [id number] Sex: Jerilynn Mages DEA#: [id number] Phone #: [phone] License #: [id number] Patient Address: [place] [address] [place], [place] [address] [place],  [address] [phone] Allergies No Known Allergies Provider's Orders Podiatry - ICD10: [phone] - Podiatry referral for nail clipping. Signature(s): Date(s): Electronic Signature(s) Signed: 06/27/2019 5:41:03 PM By: Linton Ham MD Signed: 06/27/2019 6:05:23 PM By: Deon Pilling Entered By: Deon Pilling on 06/27/2019 13:34:16 --------------------------------------------------------------------------------  Problem List Details Patient Name: Date of Service: JAKIAH, SHEEKS 06/27/2019 1:00 PM Medical Record [id number] Patient Account Number: [id number] Date of Birth/Sex: Treating RN: 11/16/1960 (59 y.o. [name] Primary Care Provider: Sinclair Ship Other Clinician: Referring Provider: Treating Provider/Extender:Baldo Hufnagle,  Gustavo Lah, Cynda Familia in Treatment: 24 Active Problems ICD-10 Evaluated Encounter Code Description Active Date Today Diagnosis L97.521 Non-pressure chronic ulcer of other part of left foot 01/10/2019 No Yes limited to breakdown of skin L97.821 Non-pressure chronic ulcer of other part of left lower 01/10/2019 No Yes leg limited to breakdown of skin I87.331 Chronic venous hypertension (idiopathic) with ulcer 01/10/2019 No Yes and inflammation of right lower extremity M05.80 Other rheumatoid arthritis with rheumatoid factor of 01/10/2019 No Yes unspecified site L97.211 Non-pressure chronic ulcer of right calf limited to 04/04/2019 No Yes breakdown of skin L97.311 Non-pressure chronic ulcer of right ankle limited to 04/04/2019 No Yes breakdown of skin Inactive Problems ICD-10 Code Description Active Date Inactive Date L97.511 Non-pressure chronic ulcer of other part of right foot limited to 01/10/2019 01/10/2019 breakdown of skin L97.812 Non-pressure chronic ulcer of other part of right lower leg with 01/10/2019 01/10/2019 fat layer exposed Resolved Problems Electronic Signature(s) Signed: 06/27/2019 5:41:03 PM By: Linton Ham MD Entered By: Linton Ham on 06/27/2019 13:47:46 -------------------------------------------------------------------------------- Progress Note Details Patient Name: Date of Service: DEMARION, OVERBAUGH 06/27/2019 1:00 PM Medical Record [id number] Patient Account Number: [id number] Date of Birth/Sex: Treating RN: 1960-11-23 (59 y.o. [name] Primary Care Provider: Sinclair Ship Other Clinician: Referring Provider: Treating Provider/Extender:Johnni Wunschel, Gustavo Lah, Cynda Familia in Treatment: 24 Subjective History of Present Illness (HPI) ADMISSION 01/10/2019 This is a 59 year old man who has rheumatoid arthritis on Remicade. He is not a diabetic. He had forefoot surgery by Dr. Bettye Boeck on June 25 for correction of hammertoes. At some point  in the postop follow-up he presented with multiple wounds on the left foot. I do not really have a good description at this point but I will try  to look through [facility] health link. He had arterial studies on 11/28/2018 that were within normal limits. Seen by podiatry on 8/24 with multiple left foot and lower extremity ulcers. He was treated with Bactrim and Cipro. He was hospitalized from 8/28 through 8/30 with wounds on the left anterior foot. An MRI was negative at that point the wounds were on the left anterior and left medial foot. Felt to have cellulitis. He saw his primary doctor in follow-up on 9/3 and felt to have pressure ulcers on the foot. The patient states that he developed these after some acute swelling last month. This may have been a result of infection. He comes in today with multiple wounds on his toes dorsal feet and distal lower extremities anteriorly. The exact cause of this is not completely clear. He does not have a prior wound history. He has multiple lower extremity wounds including the left medial foot, left dorsal foot, left second toe, left anterior mid tibia, right dorsal foot, right fourth toe medially and the right medial lower leg. The areas on the left medial foot and left dorsal foot are large superficial wounds most of the rest of these are small punched out looking areas. He does not complain of a lot of pain Past medical history; hypertension, rheumatoid arthritis on Remicade infusions, hyperlipidemia right forefoot surgery on 6/25, he is on Xarelto at this point for reasons that are not totally clear. Arterial studies on 11/28/2018 showed an ABI of 0.98 on the right 1.01 on the left. TBI's of 0.79 on the right 0.75 on the left and triphasic waveforms bilaterally 9/24; wounds on his bilateral lower feet and lower legs. We have been using silver collagen on the wounds. The big issue is that he appears to have changes of chronic venous disease with stasis dermatitis  but I also wondered whether this could represent rheumatoid vasculitis. Follows with Dr. Raliegh Ip" rheumatology at [facility]. 10/1; patient with wounds on his bilateral lower feet and lower legs we have been using silver collagen. 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 He does not have macrovascular disease by previous noninvasive studies in August. 10/8; patient with wounds on his bilateral lower feet and legs. Small punched out areas. I have no doubt he has chronic venous disease however these wounds do not look like classic venous ulcers. He is generally been making good improvements. 10/15; patients with small wounds on his bilateral lower legs. He has nothing left on the right 3 wounds on the left are making good progress. On the left we have the left medial foot, left anterior tibial area and the larger area on the dorsal left foot in close proximity to the second and third toes. All of these look a lot better. The patient has clear evidence of chronic venous insufficiency although these wounds did not look like venous insufficiency wounds 10/22; patient has no wounds left on the right. He has 3 wounds on the left arm making good progress. Left medial foot left anterior tibia and the left dorsal foot. These are likely related to venous insufficiency. He has dark fibrotic adherent skin in his bilateral ankle areas. 11/5; still no open wounds on the right. Left medial foot and the left dorsal foot just proximal to the toes of the 2 remaining wounds. The area on the left anterior tibia is healed 11/19; 2-week follow-up. The area on the left medial foot is  closed. The only remaining wound is on the left dorsal foot just proximal to the toes. The area on the left anterior tibia also had been previously healed. The patient sees his rheumatologist in early December. I asked him to mention the  wounds to the rheumatologist. I had some thoughts about this being rheumatoid vasculitic related and was going to biopsy this although he seems to have done nicely with conservative treatment. The patient has his compression stocking 12/10; 3-week follow-up. He was seen by our nurses in the interim. The only thing he had last time he was here was the opening on the dorsal foot just proximal to the toes. He was wearing a stocking on the right leg. He says in the last week he developed open areas on the medial ankle and medial calf on the right. 05/02/2019; almost 4-week follow-up. He has the original wound in the left dorsal foot which is not a lot better certainly not worse. He had new wounds last time on the right medial ankle medial calf area and on the dorsal fourth toe today.. The latter is the new wound today. The patient probably has chronic venous insufficiency one would have to wonder about rheumatoid vasculitis and if these keep on forming I probably will biopsy one 1/21; the patient has a new wound on the right third toe which probes to bone. We still do not have an exact diagnosis of this. I went ahead and biopsied the right medial leg and the left dorsal foot these were shave biopsieso Vasculitis 2/4; biopsies I did of the right medial lower leg and left dorsal foot showed stasis dermatitis without evidence of vasculitis. Special stain was negative for fungus. Fite stain was negative for Mycobacterium. We have been using Hydrofera Blue on the wounds on his legs and endoform to the right third toe. Everything looks a little better today 3/4; since the patient was last here he had his reflux studies. These showed no evidence of DVT. On the right there was reflux in the right greater saphenous vein right common femoral vein right saphenofemoral junction right popliteal vein. 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 On the left no evidence of a DVT there is no evidence of  superficial venous reflux seen in the left small saphenous vein there was venous reflux noted in the left common femoral vein the left greater saphenous vein in the proximal thigh. I did not see much description of venous reflux in the lower extremities although he clearly has chronic venous changes in the skin in his lower legs. He also saw rheumatology at [facility] on 06/12/2019. They noted the nonhealing wounds in his lower extremities. Because of this they held his Remicade infusions. Started him on prednisone 10 mg a day I believe 5 twice daily. He was to return in 2 months. They did document normal arterial studies in August, the biopsy in 17-Jun-2022 was negative for vasculitis fungi or Mycobacterium. His findings were suggestive of stasis dermatitis He has multiple small wounds bilaterally in color including the right medial lower extremity, right third toe, left medial lower leg left dorsal foot. He has a new area on the right medial calf. These are all very small punched out areas that do not have the appearance of venous wounds. As noted previous biopsy we did in this clinic was negative for vasculitis/rheumatoid vasculitis Objective Constitutional Sitting or standing Blood Pressure is within target range for patient.. Pulse regular and within target range for patient.Marland Kitchen Respirations regular,  non-labored and within target range.Marland Kitchen Appears in no distress. Vitals Time Taken: 1:00 PM, Height: 63 in, Weight: 162 lbs, BMI: 28.7, Temperature: 98.7 F, Pulse: 98 bpm, Respiratory Rate: 17 breaths/min, Blood Pressure: 137/80 mmHg. General Notes: Wound exam; the patient has several small wounds including a new one on the right medial lower leg although these are previously quoted in the HPI. They all have reasonably healthy surfaces there is small not superficial. He clearly has changes of hemosiderin deposition chronic venous insufficiency but these wounds do not look like venous insufficiency  wounds Integumentary (Hair, Skin) Wound #10 status is Open. Original cause of wound was Gradually Appeared. The wound is located on the Right,Medial Ankle. The wound measures 0.5cm length x 0.5cm width x 0.1cm depth; 0.196cm^2 area and 0.02cm^3 volume. There is Fat Layer (Subcutaneous Tissue) Exposed exposed. There is no tunneling or undermining noted. There is a small amount of serosanguineous drainage noted. The wound margin is distinct with the outline attached to the wound base. There is large (67-100%) red granulation within the wound bed. There is no necrotic tissue within the wound bed. Wound #11 status is Open. Original cause of wound was Gradually Appeared. The wound is located on the Right Toe Third. The wound measures 0.2cm length x 0.4cm width x 0.1cm depth; 0.063cm^2 area and 0.006cm^3 volume. There is Fat Layer (Subcutaneous Tissue) Exposed exposed. There is no tunneling or undermining noted. There is a small amount of serosanguineous drainage noted. The wound margin is distinct with the outline attached to the wound base. There is large (67-100%) red granulation within the wound bed. There is no necrotic tissue within the wound bed. Wound #12 status is Open. Original cause of wound was Gradually Appeared. The wound is located on the Left,Medial Lower Leg. The wound measures 0.4cm length x 0.4cm width x 0.1cm depth; 0.126cm^2 area and 0.013cm^3 volume. There is Fat Layer (Subcutaneous Tissue) Exposed exposed. There is no tunneling or undermining noted. There is a small amount of serosanguineous drainage noted. The wound margin is distinct with the outline attached to the wound base. There is large (67-100%) red, pink granulation within the wound bed. There is no necrotic tissue within the wound bed. Wound #5 status is Open. Original cause of wound was Gradually Appeared. The wound is located on the Left,Dorsal Foot. The wound measures 1.6cm length x 0.8cm width x 0.1cm depth;  1.005cm^2 area and 0.101cm^3 volume. There is Fat Layer (Subcutaneous Tissue) Exposed exposed. There is no tunneling or undermining noted. There is a small amount of serosanguineous drainage noted. The wound margin is distinct with the outline attached to the wound base. There is medium (34-66%) pink granulation within the wound bed. There is a medium (34-66%) amount of necrotic tissue within the wound bed including Adherent Slough. Wound #9 status is Open. Original cause of wound was Gradually Appeared. The wound is located on the Right,Medial Lower Leg. The wound measures 0.7cm length x 0.6cm width x 0.1cm depth; 0.33cm^2 area and 0.033cm^3 volume. There is Fat Layer (Subcutaneous Tissue) Exposed exposed. There is no tunneling or undermining noted. There is a small amount of serosanguineous drainage noted. The wound margin is flat and intact. There is large (67-100%) red granulation within the wound bed. There is no necrotic tissue within the wound bed. 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 There was a Three Layer Compression Therapy Procedure with a pre-treatment ABI of 1 by [name], RN. Post procedure Diagnosis Wound #10: Same as Pre-Procedure Wound #12 Pre-procedure diagnosis of Wound #12 is an Inflammatory located on the Left,Medial Lower Leg . There was a Three Layer Compression Therapy Procedure with  a pre-treatment ABI of 1 by [name], RN. Post procedure Diagnosis Wound #12: Same as Pre-Procedure Wound #5 Pre-procedure diagnosis of Wound #5 is an Inflammatory located on the Left,Dorsal Foot . There was a Three Layer Compression Therapy Procedure with a pre-treatment ABI of 1 by [name], RN. Post procedure Diagnosis Wound #5: Same as Pre-Procedure Wound #9 Pre-procedure diagnosis of Wound #9 is an Inflammatory located on the Right,Medial Lower Leg . There was a Three Layer Compression Therapy Procedure with a pre-treatment ABI of 1 by [name], RN. Post procedure Diagnosis Wound #9: Same as Pre-Procedure Plan Follow-up Appointments: Return Appointment in 2 weeks. - Thursday Dressing Change Frequency: Change dressing three times week. - twice a week by home health. Skin Barriers/Peri-Wound Care: TCA Cream or Ointment - liberally to both legs, feet, toes, and all wounds in clinic and at home by home health. Wound Cleansing: May shower and wash wound with soap and water. - with dressing changes. Primary Wound Dressing: Wound #10 Right,Medial Ankle: Calcium Alginate with Silver Wound #11 Right Toe Third: Calcium Alginate with Silver Wound #12 Left,Medial Lower Leg: Calcium Alginate with Silver Wound #5 Left,Dorsal Foot: Calcium Alginate with Silver Wound #9 Right,Medial Lower Leg: Calcium Alginate with Silver Secondary Dressing: Wound #10 Right,Medial Ankle: Dry Gauze Wound #11 Right Toe Third: Kerlix/Rolled Gauze Dry Gauze Wound #5 Left,Dorsal Foot: Dry Gauze Wound #9 Right,Medial Lower Leg: Dry Gauze Edema Control: 3 Layer Compression System - Bilateral Avoid standing for long periods of time Elevate legs to the level of the heart or above for 30 minutes daily and/or when sitting, a frequency of: - throughout the day. Home Health: [facility] for [facility]. - [facility]. Consults ordered were: Vascular - Vascular  consult with [facility] related to venous insufficiency., Podiatry - Podiatry referral for nail clipping. 1. At this point I think I am going to change these to silver alginate 2. The patient's Remicade was put on hold in response to nonhealing ulcers by rheumatology at [facility] 3. They started him on prednisone however. They did note my previously negative biopsy for rheumatoid vasculitis. 4. The patient clearly has chronic venous insufficiency but most of it appears to be in his thighs. I will reach out to Dr. Doren Custard via secure text message to get him to look at this. It is easy enough to make a consult appointment but this may not be necessary. I spent 30 minutes in review of the patient's records at [facility] reflux studies, face-to-face evaluation and preparation of this record Electronic Signature(s) Signed: 06/27/2019 5:41:03 PM By: Linton Ham MD Entered By: Linton Ham on 06/27/2019 14:03:57 -------------------------------------------------------------------------------- SuperBill Details Patient Name: Date of Service: YUVAL, MAHAJAN 06/27/2019 Medical Record [id number] Patient Account Number: [id number] Date of Birth/Sex: Treating RN: [id number] (59 y.o. [name] Primary Care Provider:  Sinclair Ship Other Clinician: Referring Provider: Treating Provider/Extender:Lunetta Marina, Gustavo Lah, LORI Weeks in Treatment: 24 Diagnosis Coding ICD-10 Codes Code Description L97.521 Non-pressure chronic ulcer of other part of left foot limited to breakdown of skin L97.821 Non-pressure chronic ulcer of other part of left lower leg limited to breakdown of skin I87.331 Chronic venous hypertension (idiopathic) with ulcer and inflammation of right lower extremity M05.80 Other rheumatoid arthritis with rheumatoid factor of unspecified site L97.211 Non-pressure chronic ulcer of right calf limited to breakdown of skin L97.311 Non-pressure chronic ulcer of right ankle limited  to breakdown of skin Facility Procedures Physician Procedures CPT4 Code Description: [id number] - WC PHYS LEVEL 4 - EST PT ICD-10 Diagnosis Description L97.521 Non-pressure chronic ulcer of other part of left foot lim L97.821 Non-pressure chronic ulcer of other part of left lower le skin L97.211 Non-pressure  chronic ulcer of right calf limited to break L97.311 Non-pressure chronic ulcer of right ankle limited to brea Modifier: ited to breakdo g limited to br down of skin kdown of skin Quantity: 1 wn of skin eakdown of Electronic Signature(s) Signed: 06/27/2019 5:41:03 PM By: Linton Ham MD Entered By: Linton Ham on 06/27/2019 14:04:25

## 2019-07-01 NOTE — Progress Notes (Signed)
Jonathan [name], Jonathan [name] ([id number]) Visit Report for 06/27/2019 Arrival Information Details Patient Name: Date of Service: Jonathan [name], Jonathan [name] 06/27/2019 1:00 PM Medical Record [id number] Patient Account Number: [id number] Date of Birth/Sex: Treating RN: [birth year] (59 y.o. [name] Primary Care Latessa Tillis: [employer] Other Clinician: Referring Gary Bultman: Treating Sereena Marando/Extender:Robson, Gustavo Lah, Cynda Familia in Treatment: 24 Visit Information History Since Last Visit All ordered tests and consults were completed: Yes Patient Arrived: Crutches Added or deleted any medications: No Arrival Time: 12:56 Any new allergies or adverse reactions: No Accompanied By: self Had a fall or experienced change in No Transfer Assistance: None activities of daily living that may affect Patient Identification Verified: Yes risk of falls: Secondary Verification Process Yes Signs or symptoms of abuse/neglect since last No Completed: visito Patient Requires Transmission- No Hospitalized since last visit: No Based Precautions: Implantable device outside of the clinic excluding No Patient Has Alerts: Yes cellular tissue based products placed in the center Patient Alerts: Patient on Blood since last visit: Thinner Has Dressing in Place as Prescribed: Yes ABI Right .98 Has Compression in Place as Prescribed: Yes ABI Left 1.01 Pain Present Now: Yes Electronic Signature(s) Signed: 07/01/2019 5:31:36 PM By: Kela Millin Entered By: Kela Millin on 06/27/2019 13:02:37 -------------------------------------------------------------------------------- Compression Therapy Details Patient Name: Date of Service: Jonathan [name], Jonathan [name] 06/27/2019 1:00 PM Medical Record [id number] Patient Account Number: [id number] Date of Birth/Sex: Treating RN: 05-11-1960 (59 y.o. [name] Primary Care Trigger Frasier: [employer] Other Clinician: Referring Abhimanyu Cruces: Treating  Anaysha Andre/Extender:Robson, Gustavo Lah, Cynda Familia in Treatment: 24 Compression Therapy Performed for Wound Wound #10 Right,Medial Ankle Assessment: Performed By: Clinician [name], RN Compression Type: Three Layer Pre Treatment ABI: 1 Post Procedure Diagnosis Same as Pre-procedure Electronic Signature(s) Signed: 06/27/2019 6:05:23 PM By: Deon Pilling Entered By: Deon Pilling on 06/27/2019 13:29:45 -------------------------------------------------------------------------------- Compression Therapy Details Patient Name: Date of Service: Jonathan [name], Jonathan [name] 06/27/2019 1:00 PM Medical Record [id number] Patient Account Number: [id number] Date of Birth/Sex: Treating RN: 17-Mar-1961 (59 y.o. [name] Primary Care Bobbette Eakes: [employer] Other Clinician: Referring Jearl Soto: Treating Shaylin Blatt/Extender:Robson, Gustavo Lah, Cynda Familia in Treatment: 24 Compression Therapy Performed for Wound Wound #12 Left,Medial Lower Leg Assessment: Performed By: Clinician [name], RN Compression Type: Three Layer Pre Treatment ABI: 1 Post Procedure Diagnosis Same as Pre-procedure Electronic Signature(s) Signed: 06/27/2019 6:05:23 PM By: Deon Pilling Entered By: Deon Pilling on 06/27/2019 13:29:45 -------------------------------------------------------------------------------- Compression Therapy Details Patient Name: Date of Service: Jonathan [name], Jonathan [name] 06/27/2019 1:00 PM Medical Record [id number] Patient Account Number: [id number] Date of Birth/Sex: Treating RN: Aug 01, 1960 (59 y.o. [name] Primary Care Zymir Napoli: [employer] Other Clinician: Referring Bluma Buresh: Treating Priscillia Fouch/Extender:Robson, Gustavo Lah, Cynda Familia in Treatment: 24 Compression Therapy Performed for Wound Wound #5 Left,Dorsal Foot Assessment: Performed By: Clinician [name], RN Compression Type: Three Layer Pre Treatment ABI: 1 Post Procedure Diagnosis Same as  Pre-procedure Electronic Signature(s) Signed: 06/27/2019 6:05:23 PM By: Deon Pilling Entered By: Deon Pilling on 06/27/2019 13:29:45 -------------------------------------------------------------------------------- Compression Therapy Details Patient Name: Date of Service: Jonathan [name], Jonathan [name] 06/27/2019 1:00 PM Medical Record [id number] Patient Account Number: [id number] Date of Birth/Sex: Treating RN: 27-Feb-1961 (59 y.o. [name] Primary Care Encarnacion Bole: [employer] Other Clinician: Referring Faysal Fenoglio: Treating Oniya Mandarino/Extender:Robson, Gustavo Lah, Cynda Familia in Treatment: 24 Compression Therapy Performed for Wound Wound #9 Right,Medial Lower Leg Assessment: Performed By: Clinician [name], RN Compression Type: Three Layer Pre Treatment ABI: 1 Post Procedure Diagnosis Same as Pre-procedure Electronic Signature(s) Signed: 06/27/2019 6:05:23 PM By: Deon Pilling Entered By: Deon Pilling on 06/27/2019 13:29:45 --------------------------------------------------------------------------------  Encounter Discharge Information Details Patient Name: Date of Service: Jonathan [name], Jonathan [name] 06/27/2019 1:00 PM Medical Record [id number] Patient Account Number: [id number] Date of Birth/Sex: Treating RN: 1960/09/18 (60 y.o. [name] Primary Care Kima Malenfant: [employer] Other Clinician: Referring Kevis Qu: Treating Amari Zagal/Extender:Robson, Gustavo Lah, Cynda Familia in Treatment: 24 Encounter Discharge Information Items Discharge Condition: Stable Ambulatory Status: Crutches Discharge Destination: Home Transportation: Private Auto Accompanied By: alone Schedule Follow-up Appointment: Yes Clinical Summary of Care: Patient Declined Electronic Signature(s) Signed: 06/27/2019 5:39:26 PM By: [name] RN, BSN Entered By: [name] on 06/27/2019 17:33:44 -------------------------------------------------------------------------------- Lower Extremity  Assessment Details Patient Name: Date of Service: Jonathan [name], Jonathan [name] 06/27/2019 1:00 PM Medical Record [id number] Patient Account Number: [id number] Date of Birth/Sex: Treating RN: [birth year] (59 y.o. [name] Primary Care Vita Currin: [employer] Other Clinician: Referring Rea Reser: Treating Nyaira Hodgens/Extender:Robson, Gustavo Lah, Cynda Familia in Treatment: 24 Edema Assessment Assessed: [Left: No] [Right: No] Edema: [Left: Yes] [Right: Yes] Calf Left: Right: Point of Measurement: 36 cm From Medial Instep 30 cm 29.5 cm Ankle Left: Right: Point of Measurement: 8 cm From Medial Instep 23 cm 22.5 cm Vascular Assessment Pulses: Dorsalis Pedis Palpable: [Left:Yes] [Right:Yes] Electronic Signature(s) Signed: 07/01/2019 5:31:36 PM By: Kela Millin Entered By: Kela Millin on 06/27/2019 13:07:01 -------------------------------------------------------------------------------- Multi Wound Chart Details Patient Name: Date of Service: Jonathan [name], Jonathan [name] 06/27/2019 1:00 PM Medical Record [id number] Patient Account Number: [id number] Date of Birth/Sex: Treating RN: 1960/12/27 (59 y.o. [name] Primary Care Jeorge Reister: [employer] Other Clinician: Referring Berania Peedin: Treating Jaydalee Bardwell/Extender:Robson, Gustavo Lah, Cynda Familia in Treatment: 24 Vital Signs Height(in): 63 Pulse(bpm): 98 Weight(lbs): 162 Blood Pressure(mmHg): 137/80 Body Mass Index(BMI): 29 Temperature(F): 98.7 Respiratory 17 Rate(breaths/min): Photos: [10:No Photos] [11:No Photos] [12:No Photos] Wound Location: [10:Right Ankle - Medial] [11:Right Toe Third] [12:Left Lower Leg - Medial] Wounding Event: [10:Gradually Appeared] [11:Gradually Appeared] [12:Gradually Appeared] Primary Etiology: [10:Inflammatory] [11:Inflammatory] [12:Inflammatory] Comorbid History: [10:Rheumatoid Arthritis, Osteoarthritis] [11:Rheumatoid Arthritis, Osteoarthritis] [12:Rheumatoid Arthritis,  Osteoarthritis] Date Acquired: [10:03/28/2019] [11:05/01/2019] [12:06/27/2019] Weeks of Treatment: [10:12] [11:8] [12:0] Wound Status: [10:Open] [11:Open] [12:Open] Measurements L x W x D 0.5x0.5x0.1 [11:0.2x0.4x0.1] [12:0.4x0.4x0.1] (cm) Area (cm) : [10:0.196] [11:0.063] [12:0.126] Volume (cm) : [10:0.02] [11:0.006] [12:0.013] % Reduction in Area: [10:89.30%] [11:92.00%] [12:N/A] % Reduction in Volume: 89.10% [11:92.40%] [12:N/A] Classification: [10:Full Thickness Without Exposed Support Structures Exposed Support Structures Exposed Support Structures] [11:Full Thickness With] [12:Full Thickness Without] Exudate Amount: [10:Small] [11:Small] [12:Small] Exudate Type: [10:Serosanguineous] [11:Serosanguineous] [12:Serosanguineous] Exudate Color: [10:red, brown] [11:red, brown] [12:red, brown] Wound Margin: [10:Distinct, outline attached Distinct, outline attached Distinct, outline attached] Granulation Amount: [10:Large (67-100%)] [11:Large (67-100%)] [12:Large (67-100%)] Granulation Quality: [10:Red] [11:Red] [12:Red, Pink] Necrotic Amount: [10:None Present (0%)] [11:None Present (0%)] [12:None Present (0%)] Exposed Structures: [10:Fat Layer (Subcutaneous Fat Layer (Subcutaneous Fat Layer (Subcutaneous Tissue) Exposed: Yes Fascia: No Tendon: No Muscle: No Joint: No Bone: No] [11:Tissue) Exposed: Yes Fascia: No Tendon: No Muscle: No Joint: No Bone: No] [12:Tissue) Exposed: Yes  Fascia: No Tendon: No Muscle: No Joint: No Bone: No] Epithelialization: [10:Small (1-33%)] [11:Large (67-100%)] [12:None] Procedures Performed: Compression Therapy [10:5] [11:N/A 9] [12:Compression Therapy N/A] Photos: [10:No Photos] [11:No Photos] [12:N/A] Wound Location: [10:Left Foot - Dorsal] [11:Right Lower Leg - Medial] [12:N/A] Wounding Event: [10:Gradually Appeared] [11:Gradually Appeared] [12:N/A] Primary Etiology: [10:Inflammatory] [11:Inflammatory] [12:N/A] Comorbid History: [10:Rheumatoid Arthritis,  Osteoarthritis] [11:Rheumatoid Arthritis, Osteoarthritis] [12:N/A] Date Acquired: [10:11/24/2018] [11:03/28/2019] [12:N/A] Weeks of Treatment: [10:24] [11:12] [12:N/A] Wound Status: [10:Open] [11:Open] [12:N/A] Measurements L x W x D 1.6x0.8x0.1 [11:0.7x0.6x0.1] [12:N/A] (cm) Area (cm) : [10:1.005] [11:0.33] [12:N/A] Volume (cm) : [10:0.101] [  11:0.033] [12:N/A] % Reduction in Area: [10:89.50%] [11:65.00%] [12:N/A] % Reduction in Volume: [10:89.40%] [11:64.90%] [12:N/A] Classification: [10:Full Thickness Without Exposed Support Structures Exposed Support Structures] [11:Full Thickness Without] [12:N/A] Exudate Amount: [10:Small] [11:Small] [12:N/A] Exudate Type: [10:Serosanguineous] [11:Serosanguineous] [12:N/A] Exudate Color: [10:red, brown] [11:red, brown] [12:N/A] Wound Margin: [10:Distinct, outline attached Flat and Intact] [12:N/A] Granulation Amount: [10:Medium (34-66%)] [11:Large (67-100%)] [12:N/A] Granulation Quality: [10:Pink] [11:Red] [12:N/A] Necrotic Amount: [10:Medium (34-66%)] [11:None Present (0%)] [12:N/A] Exposed Structures: [10:Fat Layer (Subcutaneous Fat Layer (Subcutaneous N/A Tissue) Exposed: Yes Fascia: No Tendon: No Muscle: No Joint: No Bone: No] [11:Tissue) Exposed: Yes Fascia: No Tendon: No Muscle: No Joint: No Bone: No] Epithelialization: [10:Small (1-33%) Compression Therapy] [11:Medium (34-66%) Compression Therapy] [12:N/A N/A] Treatment Notes Electronic Signature(s) Signed: 06/27/2019 5:41:03 PM By: Linton Ham MD Signed: 06/27/2019 6:05:23 PM By: Deon Pilling Entered By: Linton Ham on 06/27/2019 13:54:52 -------------------------------------------------------------------------------- Multi-Disciplinary Care Plan Details Patient Name: Date of Service: Jonathan [name], Jonathan [name] 06/27/2019 1:00 PM Medical Record [id number] Patient Account Number: [id number] Date of Birth/Sex: Treating RN: 05/10/1960 (59 y.o. [name] Primary Care Aaron Bostwick: [employer] Other Clinician: Referring Janal Haak: Treating Jac Romulus/Extender:Robson, Gustavo Lah, Cynda Familia in Treatment: 24 Active Inactive Nutrition Nursing Diagnoses: Potential for alteratiion in Nutrition/Potential for imbalanced nutrition Goals: Patient/caregiver agrees to and verbalizes understanding of need to obtain nutritional consultation Date Initiated: 01/10/2019 Target Resolution Date: 07/26/2019 Goal Status: Active Interventions: Provide education on nutrition Treatment Activities: Education provided on Nutrition : 04/04/2019 Patient referred to Primary Care Physician for further nutritional evaluation : 01/10/2019 Notes: Electronic Signature(s) Signed: 06/27/2019 6:05:23 PM By: Deon Pilling Entered By: Deon Pilling on 06/27/2019 13:18:00 -------------------------------------------------------------------------------- Pain Assessment Details Patient Name: Date of Service: Jonathan [name], Jonathan [name] 06/27/2019 1:00 PM Medical Record [id number] Patient Account Number: [id number] Date of Birth/Sex: Treating RN: April 01, 1961 (59 y.o. [name] Primary Care Rhyli Depaula: [employer] Other Clinician: Referring Demaurion Dicioccio: Treating Soniyah Mcglory/Extender:Robson, Gustavo Lah, Cynda Familia in Treatment: 24 Active Problems Location of Pain Severity and Description of Pain Patient Has Paino Yes Site Locations Pain Location: Generalized Pain, Pain in Ulcers With Dressing Change: Yes Duration of the Pain. Constant / Intermittento Constant Rate the pain. Current Pain Level: 7 Worst Pain Level: 9 Least Pain Level: 3 Tolerable Pain Level: 5 Character of Pain Describe the Pain: Aching, Burning, Stabbing Pain Management and Medication Current Pain Management: Electronic Signature(s) Signed: 06/27/2019 6:05:23 PM By: Deon Pilling Signed: 07/01/2019 5:31:36 PM By: Kela Millin Entered By: Kela Millin on 06/27/2019  13:04:14 -------------------------------------------------------------------------------- Patient/Caregiver Education Details Patient Name: Date of Service: Jonathan [name], Jonathan [name] 3/4/2021andnbsp1:00 PM Medical Record [id number] Patient Account Number: [id number] Date of Birth/Gender: Treating RN: 16-Jun-1960 (59 y.o. [name] Primary Care Physician: [employer] Other Clinician: Referring Physician: Treating Physician/Extender:Robson, Gustavo Lah, Cynda Familia in Treatment: 24 Education Assessment Education Provided To: Patient Education Topics Provided Nutrition: Handouts: Nutrition Methods: Explain/Verbal Responses: Reinforcements needed Electronic Signature(s) Signed: 06/27/2019 6:05:23 PM By: Deon Pilling Entered By: Deon Pilling on 06/27/2019 13:18:10 -------------------------------------------------------------------------------- Wound Assessment Details Patient Name: Date of Service: Jonathan [name], REEM 06/27/2019 1:00 PM Medical Record [id number] Patient Account Number: [id number] Date of Birth/Sex: Treating RN: 02/25/1961 (59 y.o. [name] Primary Care Aristides Luckey: [employer] Other Clinician: Referring Ysabella Babiarz: Treating Kaidan Harpster/Extender:Robson, Gustavo Lah, LORI Weeks in Treatment: 24 Wound Status Wound Number: 10 Primary Etiology: Inflammatory Wound Location: Right Ankle - Medial Wound Status: Open Wounding Event: Gradually Appeared Comorbid History: Rheumatoid Arthritis, Osteoarthritis Date Acquired: 03/28/2019 Weeks Of Treatment: 12 Clustered Wound: No Photos Wound Measurements Length: (cm) 0.5 % Reduct Width: (cm)  0.5 % Reduct Depth: (cm) 0.1 Epitheli Area: (cm) 0.196 Tunneli Volume: (cm) 0.02 Undermi Wound Description Classification: Full Thickness Without Exposed Support Foul Odo Structures Slough/F Wound Distinct, outline attached Margin: Exudate Small Amount: Exudate Serosanguineous Type: Exudate red,  brown Color: Wound Bed Granulation Amount: Large (67-100%) Granulation Quality: Red Fascia E Necrotic Amount: None Present (0%) Fat Laye Tendon E Muscle E Joint Ex Bone Exp r After Cleansing: No ibrino No Exposed Structure xposed: No r (Subcutaneous Tissue) Exposed: Yes xposed: No xposed: No posed: No osed: No ion in Area: 89.3% ion in Volume: 89.1% alization: Small (1-33%) ng: No ning: No Treatment Notes Wound #10 (Right, Medial Ankle) 1. Cleanse With Soap and water 2. Periwound Care TCA Ointment 3. Primary Dressing Applied Calcium Alginate Ag 4. Secondary Dressing Dry Gauze 6. Support Layer Applied 3 layer compression wrap Electronic Signature(s) Signed: 06/28/2019 4:01:29 PM By: Mikeal Hawthorne EMT/HBOT Signed: 06/28/2019 5:34:19 PM By: Deon Pilling Entered By: Mikeal Hawthorne on 06/28/2019 11:13:49 -------------------------------------------------------------------------------- Wound Assessment Details Patient Name: Date of Service: EDRAS, STOLTE 06/27/2019 1:00 PM Medical Record [id number] Patient Account Number: [id number] Date of Birth/Sex: Treating RN: 1960-07-25 (59 y.o. [name] Primary Care Jereme Loren: [employer] Other Clinician: Referring Magalie Almon: Treating Kaeden Mester/Extender:Robson, Gustavo Lah, Cynda Familia in Treatment: 24 Wound Status Wound Number: 11 Primary Etiology: Inflammatory Wound Location: Right Toe Third Wound Status: Open Wounding Event: Gradually Appeared Comorbid History: Rheumatoid Arthritis, Osteoarthritis Date Acquired: 05/01/2019 Weeks Of Treatment: 8 Clustered Wound: No Photos Wound Measurements Length: (cm) 0.2 % Reduction Width: (cm) 0.4 % Reduction Depth: (cm) 0.1 Epitheliali Area: (cm) 0.063 Tunneling: Volume: (cm) 0.006 Underminin Wound Description Full Thickness With Exposed Support Foul Odor A Classification: Structures Slough/Fibr Wound Distinct, outline attached Margin: Exudate  Small Amount: Exudate Type: Serosanguineous Exudate Color: red, brown Wound Bed Granulation Amount: Large (67-100%) Granulation Quality: Red Fascia Expo Necrotic Amount: None Present (0%) Fat Layer ( Tendon Expo Muscle Expo Joint Expos Bone Expose Treatment Notes Wound #11 (Right Toe Third) 1. Cleanse With Wound Cleanser 3. Primary Dressing Applied Calcium Alginate Ag 4. Secondary Dressing Dry Gauze Roll Gauze 5. Secured With Tape fter Cleansing: No ino No Exposed Structure sed: No Subcutaneous Tissue) Exposed: Yes sed: No sed: No ed: No d: No in Area: 92% in Volume: 92.4% zation: Large (67-100%) No g: No Electronic Signature(s) Signed: 06/28/2019 4:01:29 PM By: Mikeal Hawthorne EMT/HBOT Signed: 06/28/2019 5:34:19 PM By: Deon Pilling Entered By: Mikeal Hawthorne on 06/28/2019 11:20:28 -------------------------------------------------------------------------------- Wound Assessment Details Patient Name: Date of Service: ROMONE, CZAPLEWSKI 06/27/2019 1:00 PM Medical Record [id number] Patient Account Number: [id number] Date of Birth/Sex: Treating RN: [birth year] (59 y.o. [name] Primary Care Shaquoia Miers: [employer] Other Clinician: Referring Damir Leung: Treating Brion Hedges/Extender:Robson, Gustavo Lah, Cynda Familia in Treatment: 24 Wound Status Wound Number: 12 Primary Etiology: Inflammatory Wound Location: Left Lower Leg - Medial Wound Status: Open Wounding Event: Gradually Appeared Comorbid History: Rheumatoid Arthritis, Osteoarthritis Date Acquired: 06/27/2019 Weeks Of Treatment: 0 Clustered Wound: No Photos Wound Measurements Length: (cm) 0.4 % Reductio Width: (cm) 0.4 % Reductio Depth: (cm) 0.1 Epithelial Area: (cm) 0.126 Tunneling Volume: (cm) 0.013 Undermini Wound Description Classification: Full Thickness Without Exposed Support Foul Odo Structures Slough/F Wound Distinct, outline attached Margin: Exudate Small Amount: Exudate  Serosanguineous Type: Exudate red, brown Color: Wound Bed Granulation Amount: Large (67-100%) Granulation Quality: Red, Pink Fascia E Necrotic Amount: None Present (0%) Fat Laye Tendon E Muscle E Joint Ex Bone Exp r After Cleansing: No ibrino No Exposed Structure xposed: No r (  Subcutaneous Tissue) Exposed: Yes xposed: No xposed: No posed: No osed: No n in Area: 0% n in Volume: 0% ization: None : No ng: No Treatment Notes Wound #12 (Left, Medial Lower Leg) 1. Cleanse With Soap and water 2. Periwound Care TCA Ointment 3. Primary Dressing Applied Calcium Alginate Ag 4. Secondary Dressing Dry Gauze 6. Support Layer Applied 3 layer compression wrap Electronic Signature(s) Signed: 06/28/2019 4:01:29 PM By: Mikeal Hawthorne EMT/HBOT Signed: 06/28/2019 5:34:19 PM By: Deon Pilling Entered By: Mikeal Hawthorne on 06/28/2019 11:12:50 -------------------------------------------------------------------------------- Wound Assessment Details Patient Name: Date of Service: NIXXON, GILSTRAP 06/27/2019 1:00 PM Medical Record [id number] Patient Account Number: [id number] Date of Birth/Sex: Treating RN: 04/28/1960 (59 y.o. [name] Primary Care Abdoulaye Drum: [employer] Other Clinician: Referring Anasofia Micallef: Treating Serrena Linderman/Extender:Robson, Gustavo Lah, Cynda Familia in Treatment: 24 Wound Status Wound Number: 5 Primary Etiology: Inflammatory Wound Location: Left Foot - Dorsal Wound Status: Open Wounding Event: Gradually Appeared Comorbid History: Rheumatoid Arthritis, Osteoarthritis Date Acquired: 11/24/2018 Weeks Of Treatment: 24 Clustered Wound: No Photos Wound Measurements Length: (cm) 1.6 % Reduct Width: (cm) 0.8 % Reduct Depth: (cm) 0.1 Epitheli Area: (cm) 1.005 Tunneli Volume: (cm) 0.101 Undermi Wound Description Classification: Full Thickness Without Exposed Support Foul Odo Structures Slough/F Wound Distinct, outline attached Margin: Exudate  Small Amount: Exudate Serosanguineous Type: Exudate red, brown Color: Wound Bed Granulation Amount: Medium (34-66%) Granulation Quality: Pink Fascia E Necrotic Amount: Medium (34-66%) Fat Laye Necrotic Quality: Adherent Slough Tendon E Muscle E Joint Ex Bone Exp r After Cleansing: No ibrino Yes Exposed Structure xposed: No r (Subcutaneous Tissue) Exposed: Yes xposed: No xposed: No posed: No osed: No ion in Area: 89.5% ion in Volume: 89.4% alization: Small (1-33%) ng: No ning: No Treatment Notes Wound #5 (Left, Dorsal Foot) 1. Cleanse With Soap and water 2. Periwound Care TCA Ointment 3. Primary Dressing Applied Calcium Alginate Ag 4. Secondary Dressing Dry Gauze 6. Support Layer Applied 3 layer compression wrap Electronic Signature(s) Signed: 06/28/2019 4:01:29 PM By: Mikeal Hawthorne EMT/HBOT Signed: 06/28/2019 5:34:19 PM By: Deon Pilling Entered By: Mikeal Hawthorne on 06/28/2019 11:12:17 -------------------------------------------------------------------------------- Wound Assessment Details Patient Name: Date of Service: DIMITRI, SCHUUR 06/27/2019 1:00 PM Medical Record [id number] Patient Account Number: [id number] Date of Birth/Sex: Treating RN: 08-27-1960 (59 y.o. [name] Primary Care Nakeesha Bowler: [employer] Other Clinician: Referring Calvyn Kurtzman: Treating Laurana Magistro/Extender:Robson, Gustavo Lah, Cynda Familia in Treatment: 24 Wound Status Wound Number: 9 Primary Etiology: Inflammatory Wound Location: Right Lower Leg - Medial Wound Status: Open Wounding Event: Gradually Appeared Comorbid History: Rheumatoid Arthritis, Osteoarthritis Date Acquired: 03/28/2019 Weeks Of Treatment: 12 Clustered Wound: No Photos Wound Measurements Length: (cm) 0.7 % Reduct Width: (cm) 0.6 % Reduct Depth: (cm) 0.1 Epitheli Area: (cm) 0.33 Tunneli Volume: (cm) 0.033 Undermi Wound Description Classification: Full Thickness Without Exposed Support Foul  Odo Structures Slough/F Wound Flat and Intact Margin: Exudate Small Amount: Exudate Serosanguineous Type: Exudate red, brown Color: Wound Bed Granulation Amount: Large (67-100%) Granulation Quality: Red Fascia E Necrotic Amount: None Present (0%) Fat Layer ( Tendon Expo Muscle Expo Joint Expos Bone Expose r After Cleansing: No ibrino No Exposed Structure xposed: No Subcutaneous Tissue) Exposed: Yes sed: No sed: No ed: No d: No ion in Area: 65% ion in Volume: 64.9% alization: Medium (34-66%) ng: No ning: No Treatment Notes Wound #9 (Right, Medial Lower Leg) 1. Cleanse With Soap and water 2. Periwound Care TCA Ointment 3. Primary Dressing Applied Calcium Alginate Ag 4. Secondary Dressing Dry Gauze 6. Support Layer Applied 3 layer compression wrap  Electronic Signature(s) Signed: 06/28/2019 4:01:29 PM By: Mikeal Hawthorne EMT/HBOT Signed: 06/28/2019 5:34:19 PM By: Deon Pilling Entered By: Mikeal Hawthorne on 06/28/2019 11:20:05 -------------------------------------------------------------------------------- Vitals Details Patient Name: Date of Service: ANAEL, YILDIZ 06/27/2019 1:00 PM Medical Record [id number] Patient Account Number: [id number] Date of Birth/Sex: Treating RN: 1961-03-20 (59 y.o. [name] Primary Care Carol Loftin: Sinclair Ship Other Clinician: Referring Seith Aikey: Treating Joeph Szatkowski/Extender:Robson, Gustavo Lah, Cynda Familia in Treatment: 24 Vital Signs Time Taken: 13:00 Temperature (F): 98.7 Height (in): 63 Pulse (bpm): 98 Weight (lbs): 162 Respiratory Rate (breaths/min): 17 Body Mass Index (BMI): 28.7 Blood Pressure (mmHg): 137/80 Reference Range: 80 - 120 mg / dl Electronic Signature(s) Signed: 07/01/2019 5:31:36 PM By: Kela Millin Entered By: Kela Millin on 06/27/2019 13:03:17

## 2019-07-10 ENCOUNTER — Encounter (HOSPITAL_COMMUNITY): Payer: Medicare Other

## 2019-07-11 ENCOUNTER — Other Ambulatory Visit: Payer: Self-pay

## 2019-07-11 ENCOUNTER — Encounter (HOSPITAL_BASED_OUTPATIENT_CLINIC_OR_DEPARTMENT_OTHER): Payer: Medicare Other | Admitting: Internal Medicine

## 2019-07-11 DIAGNOSIS — L97821 Non-pressure chronic ulcer of other part of left lower leg limited to breakdown of skin: Secondary | ICD-10-CM | POA: Diagnosis not present

## 2019-07-11 NOTE — Progress Notes (Signed)
Jonathan [name], Jonathan [name] ([id number]) Visit Report for 07/11/2019 Debridement Details Patient Name: Date of Service: Jonathan [name], Jonathan [name] 07/11/2019 1:00 PM Medical Record [id number] Patient Account Number: [id number] Date of Birth/Sex: May 21, 1960 (59 y.o. M) Treating RN: Deon Pilling Primary Care Provider: Sinclair Ship Other Clinician: Referring Provider: Treating Provider/Extender:Darivs Lunden, Gustavo Lah, Cynda Familia in Treatment: 26 Debridement Performed for Wound #5 Left,Dorsal Foot Assessment: Performed By: Physician Ricard Dillon., MD Debridement Type: Debridement Level of Consciousness (Pre- Awake and Alert procedure): Pre-procedure Yes - 13:30 Verification/Time Out Taken: Start Time: 13:31 Pain Control: Lidocaine 4% Topical Solution Total Area Debrided (L x W): 2 (cm) x 1.3 (cm) = 2.6 (cm) Tissue and other material Viable, Non-Viable, Slough, Subcutaneous, Skin: Dermis , Fibrin/Exudate, Slough debrided: Level: Skin/Subcutaneous Tissue Debridement Description: Excisional Instrument: Curette Bleeding: Moderate Hemostasis Achieved: Pressure End Time: 13:33 Procedural Pain: 0 Post Procedural Pain: 0 Response to Treatment: Procedure was tolerated well Level of Consciousness Awake and Alert (Post-procedure): Post Debridement Measurements of Total Wound Length: (cm) 2 Width: (cm) 1.3 Depth: (cm) 0.2 Volume: (cm) 0.408 Character of Wound/Ulcer Post Improved Debridement: Post Procedure Diagnosis Same as Pre-procedure Electronic Signature(s) Signed: 07/11/2019 4:55:35 PM By: Deon Pilling Signed: 07/11/2019 7:02:26 PM By: Linton Ham MD Entered By: Deon Pilling on 07/11/2019 13:34:02 -------------------------------------------------------------------------------- Debridement Details Patient Name: Date of Service: Jonathan [name], Jonathan [name] 07/11/2019 1:00 PM Medical Record [id number] Patient Account Number: [id number] Date of Birth/Sex: 1960/12/05 (59 y.o. M)  Treating RN: Deon Pilling Primary Care Provider: Sinclair Ship Other Clinician: Referring Provider: Treating Provider/Extender:Elody Kleinsasser, Gustavo Lah, Cynda Familia in Treatment: 26 Debridement Performed for Wound #11 Right Toe Third Assessment: Performed By: Physician Ricard Dillon., MD Debridement Type: Debridement Level of Consciousness (Pre- Awake and Alert procedure): Pre-procedure Yes - 13:30 Verification/Time Out Taken: Start Time: 13:31 Pain Control: Lidocaine 4% Topical Solution Total Area Debrided (L x W): 0.3 (cm) x 0.3 (cm) = 0.09 (cm) Tissue and other material Viable, Non-Viable, Slough, Subcutaneous, Skin: Dermis , Fibrin/Exudate, Slough debrided: Level: Skin/Subcutaneous Tissue Debridement Description: Excisional Instrument: Curette Bleeding: Moderate Hemostasis Achieved: Pressure End Time: 13:33 Procedural Pain: 0 Post Procedural Pain: 0 Response to Treatment: Procedure was tolerated well Level of Consciousness Awake and Alert (Post-procedure): Post Debridement Measurements of Total Wound Length: (cm) 0.3 Width: (cm) 0.3 Depth: (cm) 0.1 Volume: (cm) 0.007 Character of Wound/Ulcer Post Improved Debridement: Post Procedure Diagnosis Same as Pre-procedure Electronic Signature(s) Signed: 07/11/2019 4:55:35 PM By: Deon Pilling Signed: 07/11/2019 7:02:26 PM By: Linton Ham MD Entered By: Deon Pilling on 07/11/2019 13:35:48 -------------------------------------------------------------------------------- HPI Details Patient Name: Date of Service: Jonathan [name], Jonathan [name] 07/11/2019 1:00 PM Medical Record [id number] Patient Account Number: [id number] Date of Birth/Sex: Treating RN: Jun 08, 1960 (59 y.o. [name] Primary Care Provider: Sinclair Ship Other Clinician: Referring Provider: Treating Provider/Extender:Zykerria Tanton, Gustavo Lah, Cynda Familia in Treatment: 26 History of Present Illness HPI Description: ADMISSION 01/10/2019 This is a  59 year old man who has rheumatoid arthritis on Remicade. He is not a diabetic. He had forefoot surgery by Dr. Bettye Boeck on June 25 for correction of hammertoes. At some point in the postop follow-up he presented with multiple wounds on the left foot. I do not really have a good description at this point but I will try to look through [facility] health link. He had arterial studies on 11/28/2018 that were within normal limits. Seen by podiatry on 8/24 with multiple left foot and lower extremity ulcers. He was treated with Bactrim and Cipro. He was hospitalized from 8/28 through 8/30 with wounds on the left anterior foot. An MRI  was negative at that point the wounds were on the left anterior and left medial foot. Felt to have cellulitis. He saw his primary doctor in follow-up on 9/3 and felt to have pressure ulcers on the foot. The patient states that he developed these after some acute swelling last month. This may have been a result of infection. He comes in today with multiple wounds on his toes dorsal feet and distal lower extremities anteriorly. The exact cause of this is not completely clear. He does not have a prior wound history. He has multiple lower extremity wounds including the left medial foot, left dorsal foot, left second toe, left anterior mid tibia, right dorsal foot, right fourth toe medially and the right medial lower leg. The areas on the left medial foot and left dorsal foot are large superficial wounds most of the rest of these are small punched out looking areas. He does not complain of a lot of pain Past medical history; hypertension, rheumatoid arthritis on Remicade infusions, hyperlipidemia right forefoot surgery on 6/25, he is on Xarelto at this point for reasons that are not totally clear. Arterial studies on 11/28/2018 showed an ABI of 0.98 on the right 1.01 on the left. TBI's of 0.79 on the right 0.75 on the left and triphasic waveforms bilaterally 9/24; wounds on his  bilateral lower feet and lower legs. We have been using silver collagen on the wounds. 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 Follows with Dr. Raliegh Ip" rheumatology at [facility]. 10/1; patient with wounds on his bilateral lower feet and lower legs we have been using silver collagen. 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 He does not have macrovascular disease by previous noninvasive studies in August. 10/8; patient with wounds on his bilateral lower feet and legs. Small punched out areas. I have no doubt he has chronic venous disease however these wounds do not look like classic venous ulcers. He is generally been making good improvements. 10/15; patients with small wounds on his bilateral lower legs. He has nothing left on the right 3 wounds on the left are making good progress. On the left we have the left medial foot, left anterior tibial area and the larger area on the dorsal left foot in close proximity to the second and third toes. All of these look a lot better. The patient has clear evidence of chronic venous insufficiency although these wounds did not look like venous insufficiency wounds 10/22; patient has no wounds left on the right. He has 3 wounds on the left arm making good progress. Left medial foot left anterior tibia and the left dorsal foot. These are likely related to venous insufficiency. He has dark fibrotic adherent skin in his bilateral ankle areas. 11/5; still no open wounds on the right. Left medial foot and the left dorsal foot just proximal to the toes of the 2 remaining wounds. The area on the left anterior tibia is healed 11/19; 2-week follow-up. The area on the left medial foot is closed. The only remaining wound is on the left  dorsal foot just proximal to the toes. The area on the left anterior tibia also had been previously healed. The patient sees his rheumatologist in early December. I asked him to mention the wounds to the rheumatologist. I had some thoughts about this  being rheumatoid vasculitic related and was going to biopsy this although he seems to have done nicely with conservative treatment. The patient has his compression stocking 12/10; 3-week follow-up. He was seen by our nurses in the interim. The only thing he had last time he was here was the opening on the dorsal foot just proximal to the toes. He was wearing a stocking on the right leg. He says in the last week he developed open areas on the medial ankle and medial calf on the right. 05/02/2019; almost 4-week follow-up. He has the original wound in the left dorsal foot which is not a lot better certainly not worse. He had new wounds last time on the right medial ankle medial calf area and on the dorsal fourth toe today.. The latter is the new wound today. The patient probably has chronic venous insufficiency one would have to wonder about rheumatoid vasculitis and if these keep on forming I probably will biopsy one 1/21; the patient has a new wound on the right third toe which probes to bone. We still do not have an exact diagnosis of this. I went ahead and biopsied the right medial leg and the left dorsal foot these were shave biopsieso Vasculitis 2/4; biopsies I did of the right medial lower leg and left dorsal foot showed stasis dermatitis without evidence of vasculitis. Special stain was negative for fungus. Fite stain was negative for Mycobacterium. We have been using Hydrofera Blue on the wounds on his legs and endoform to the right third toe. Everything looks a little better today 3/4; since the patient was last here he had his reflux studies. These showed no evidence of DVT. On the right there was reflux in the right greater saphenous vein right  common femoral vein right saphenofemoral junction right popliteal vein. 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 On the left no evidence of a DVT there is no evidence of superficial venous reflux seen in the left small saphenous vein there was venous reflux noted in the left common femoral vein the left greater saphenous vein in the proximal thigh. I did not see much description of venous reflux in the lower extremities although he clearly has chronic venous changes in the skin in his lower legs. He also saw rheumatology at [facility] on 06/12/2019. They noted the nonhealing wounds in his lower extremities. Because of this they held his Remicade infusions. Started him on prednisone 10 mg a day I believe 5 twice daily. He was to return in 2 months. They did document normal arterial studies in August, the biopsy in 2022/05/19 was negative for vasculitis fungi or Mycobacterium. His findings were suggestive of stasis dermatitis He has multiple small wounds bilaterally in color including the right medial lower extremity, right third toe, left medial lower leg left dorsal foot. He has a new area on the right medial calf. These are all very small punched out areas that do not have the appearance of venous wounds. As noted previous biopsy we did in this clinic was negative for vasculitis/rheumatoid vasculitis 3/18; patient is due to see Dr. Donnetta Hutching about reflux studies on 3/30. All the wounds on his legs and ankles are healed. He still has a fairly sizable area on the left dorsal foot just proximal to the base of the second toe. Electronic Signature(s) Signed: 07/11/2019 7:02:26 PM By: Linton Ham MD Entered By: Linton Ham on 07/11/2019 13:54:46 -------------------------------------------------------------------------------- Physical Exam Details Patient Name: Date of Service: Jonathan [name], Jonathan [name] 07/11/2019 1:00 PM Medical  Record [id number] Patient Account Number: [id number] Date of  Birth/Sex: Treating RN: [birth year] (59 y.o. [name] Primary Care Provider: Sinclair Ship Other Clinician: Referring Provider: Treating Provider/Extender:Samhitha Rosen, Gustavo Lah, Cynda Familia in Treatment: 26 Constitutional Sitting or standing Blood Pressure is within target range for patient.. Pulse regular and within target range for patient.Marland Kitchen Respirations regular, non-labored and within target range.. Temperature is normal and within the target range for the patient.Marland Kitchen Appears in no distress. Notes Wound exam; the patient continues to have skin changes of chronic venous insufficiency in his lower legs and ankles however there is no open wounds. He has an area on the base of his left second toe dorsally. Debrided with a #3 curette of adherent fibrinous surface debris hemostasis with direct pressure this actually looks some larger On the dorsal right third toe there is a very tiny area which I think is just about ready to close. Electronic Signature(s) Signed: 07/11/2019 7:02:26 PM By: Linton Ham MD Entered By: Linton Ham on 07/11/2019 13:56:59 -------------------------------------------------------------------------------- Physician Orders Details Patient Name: Date of Service: Jonathan [name], Jonathan [name] 07/11/2019 1:00 PM Medical Record [id number] Patient Account Number: [id number] Date of Birth/Sex: Treating RN: 10/18/1960 (59 y.o. [name] Primary Care Provider: Sinclair Ship Other Clinician: Referring Provider: Treating Provider/Extender:Maralee Higuchi, Gustavo Lah, Cynda Familia in Treatment: 89 Verbal / Phone Orders: No Diagnosis Coding ICD-10 Coding Code Description L97.521 Non-pressure chronic ulcer of other part of left foot limited to breakdown of skin L97.821 Non-pressure chronic ulcer of other part of left lower leg limited to breakdown of skin I87.331 Chronic venous hypertension (idiopathic) with ulcer and inflammation of right lower extremity M05.80 Other  rheumatoid arthritis with rheumatoid factor of unspecified site L97.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 - Thursday Dressing Change Frequency Change dressing three times week. - twice a week by home health. Skin Barriers/Peri-Wound Care TCA Cream or Ointment - liberally to both legs, feet, toes, and all wounds in clinic and at home by home health. Wound Cleansing May shower and wash wound with soap and water. - with dressing changes. Primary Wound Dressing Wound #11 Right Toe Third Calcium Alginate with Silver Wound #5 Left,Dorsal Foot Calcium Alginate with Silver Secondary Dressing Wound #11 Right Toe Third Kerlix/Rolled Gauze Dry Gauze Wound #5 Left,Dorsal Foot Dry Gauze Edema Control 3 Layer Compression System - Bilateral Avoid standing for long periods of time Elevate legs to the level of the heart or above for 30 minutes daily and/or when sitting, a frequency of: - throughout the day. Support Garment 20-30 mm/Hg pressure to: - patient to call and purchase compression stockings from [employer]. Bring in stockings on your next wound care visit. [place] [facility] for wound care. - [facility]. Electronic Signature(s) Signed: 07/11/2019 4:55:35 PM By: Deon Pilling Signed: 07/11/2019 7:02:26 PM By: Linton Ham MD Entered By: Deon Pilling on 07/11/2019 13:35:12 -------------------------------------------------------------------------------- Problem List Details Patient Name: Date of Service: Jonathan [name], Jonathan [name] 07/11/2019 1:00 PM Medical Record [id number] Patient Account Number: [id number] Date of Birth/Sex: Treating RN: 1961/04/15 (59 y.o. [name] Primary Care Provider: Sinclair Ship Other Clinician: Referring Provider: Treating  Provider/Extender:Jeziah Kretschmer, Gustavo Lah, Cynda Familia in Treatment: 26 Active Problems ICD-10 Evaluated Encounter Code Description Active Date Today Diagnosis L97.521 Non-pressure chronic ulcer of other part of left foot 01/10/2019 No Yes limited to breakdown of skin I87.331 Chronic  venous hypertension (idiopathic) with ulcer 01/10/2019 No Yes and inflammation of right lower extremity L97.211 Non-pressure chronic ulcer of right calf limited to 04/04/2019 No Yes breakdown of skin M05.80 Other rheumatoid arthritis with rheumatoid factor of 01/10/2019 No Yes unspecified site Inactive Problems ICD-10 Code Description Active Date Inactive Date L97.511 Non-pressure chronic ulcer of other part of right foot limited to 01/10/2019 01/10/2019 breakdown of skin L97.812 Non-pressure chronic ulcer of other part of right lower leg with 01/10/2019 01/10/2019 fat layer exposed L97.821 Non-pressure chronic ulcer of other part of left lower leg limited 01/10/2019 01/10/2019 to breakdown of skin L97.311 Non-pressure chronic ulcer of right ankle limited to breakdown 04/04/2019 04/04/2019 of skin Resolved Problems Electronic Signature(s) Signed: 07/11/2019 7:02:26 PM By: Linton Ham MD Entered By: Linton Ham on 07/11/2019 13:52:48 -------------------------------------------------------------------------------- Progress Note Details Patient Name: Date of Service: Jonathan [name], Jonathan [name] 07/11/2019 1:00 PM Medical Record [id number] Patient Account Number: [id number] Date of Birth/Sex: Treating RN: 12/17/1960 (59 y.o. [name] Primary Care Provider: Sinclair Ship Other Clinician: Referring Provider: Treating Provider/Extender:Kesler Wickham, Gustavo Lah, Cynda Familia in Treatment: 26 Subjective History of Present Illness (HPI) ADMISSION 01/10/2019 This is a 59 year old man who has rheumatoid arthritis on Remicade. He is not a diabetic. He had forefoot surgery by Dr. Bettye Boeck on June 25 for  correction of hammertoes. At some point in the postop follow-up he presented with multiple wounds on the left foot. I do not really have a good description at this point but I will try to look through [name] health link. He had arterial studies on 11/28/2018 that were within normal limits. Seen by podiatry on 8/24 with multiple left foot and lower extremity ulcers. He was treated with Bactrim and Cipro. He was hospitalized from 8/28 through 8/30 with wounds on the left anterior foot. An MRI was negative at that point the wounds were on the left anterior and left medial foot. Felt to have cellulitis. He saw his primary doctor in follow-up on 9/3 and felt to have pressure ulcers on the foot. The patient states that he developed these after some acute swelling last month. This may have been a result of infection. He comes in today with multiple wounds on his toes dorsal feet and distal lower extremities anteriorly. The exact cause of this is not completely clear. He does not have a prior wound history. He has multiple lower extremity wounds including the left medial foot, left dorsal foot, left second toe, left anterior mid tibia, right dorsal foot, right fourth toe medially and the right medial lower leg. The areas on the left medial foot and left dorsal foot are large superficial wounds most of the rest of these are small punched out looking areas. He does not complain of a lot of pain Past medical history; hypertension, rheumatoid arthritis on Remicade infusions, hyperlipidemia right forefoot surgery on 6/25, he is on Xarelto at this point for reasons that are not totally clear. Arterial studies on 11/28/2018 showed an ABI of 0.98 on the right 1.01 on the left. TBI's of 0.79 on the right 0.75 on the left and triphasic waveforms bilaterally 9/24; wounds on his bilateral lower feet and lower legs. We have been using silver collagen on the wounds. The big issue is that he appears to have changes of chronic  venous disease with stasis dermatitis but I also wondered whether this could represent rheumatoid vasculitis. Follows with Dr. Raliegh Ip" rheumatology at [facility]. 10/1; patient with wounds on his bilateral lower feet and lower legs we have been  using silver collagen. 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 He does not have macrovascular disease by previous noninvasive studies in August. 10/8; patient with wounds on his bilateral lower feet and legs. Small punched out areas. I have no doubt he has chronic venous disease however these wounds do not look like classic venous ulcers. He is generally been making good improvements. 10/15; patients with small wounds on his bilateral lower legs. He has nothing left on the right 3 wounds on the left are making good progress. On the left we have the left medial foot, left anterior tibial area and the larger area on the dorsal left foot in close proximity to the second and third toes. All of these look a lot better. The patient has clear evidence of chronic venous insufficiency although these wounds did not look like venous insufficiency wounds 10/22; patient has no wounds left on the right. He has 3 wounds on the left arm making good progress. Left medial foot left anterior tibia and the left dorsal foot. These are likely related to venous insufficiency. He has dark fibrotic adherent skin in his bilateral ankle areas. 11/5; still no open wounds on the right. Left medial foot and the left dorsal foot just proximal to the toes of the 2 remaining wounds. The area on the left anterior tibia is healed 11/19; 2-week follow-up. The area on the left medial foot is closed. The only remaining wound is on the left dorsal foot just proximal to the toes. The area on the left anterior tibia also had been previously healed. The patient sees his rheumatologist in  early December. I asked him to mention the wounds to the rheumatologist. I had some thoughts about this being rheumatoid vasculitic related and was going to biopsy this although he seems to have done nicely with conservative treatment. The patient has his compression stocking 12/10; 3-week follow-up. He was seen by our nurses in the interim. The only thing he had last time he was here was the opening on the dorsal foot just proximal to the toes. He was wearing a stocking on the right leg. He says in the last week he developed open areas on the medial ankle and medial calf on the right. 05/02/2019; almost 4-week follow-up. He has the original wound in the left dorsal foot which is not a lot better certainly not worse. He had new wounds last time on the right medial ankle medial calf area and on the dorsal fourth toe today.. The latter is the new wound today. The patient probably has chronic venous insufficiency one would have to wonder about rheumatoid vasculitis and if these keep on forming I probably will biopsy one 1/21; the patient has a new wound on the right third toe which probes to bone. We still do not have an exact diagnosis of this. I went ahead and biopsied the right medial leg and the left dorsal foot these were shave biopsieso Vasculitis 2/4; biopsies I did of the right medial lower leg and left dorsal foot showed stasis dermatitis without evidence of vasculitis. Special stain was negative for fungus. Fite stain was negative for Mycobacterium. We have been using Hydrofera Blue on the wounds on his legs and endoform to the right third toe. Everything looks a little better today 3/4; since the patient was last here he had his reflux studies. These showed no evidence of DVT. On the right there  was reflux in the right greater saphenous vein right common femoral vein right saphenofemoral junction right popliteal vein. 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 On the left no evidence  of a DVT there is no evidence of superficial venous reflux seen in the left small saphenous vein there was venous reflux noted in the left common femoral vein the left greater saphenous vein in the proximal thigh. I did not see much description of venous reflux in the lower extremities although he clearly has chronic venous changes in the skin in his lower legs. He also saw rheumatology at [facility] on 06/12/2019. They noted the nonhealing wounds in his lower extremities. Because of this they held his Remicade infusions. Started him on prednisone 10 mg a day I believe 5 twice daily. He was to return in 2 months. They did document normal arterial studies in August, the biopsy in 05/30/22 was negative for vasculitis fungi or Mycobacterium. His findings were suggestive of stasis dermatitis He has multiple small wounds bilaterally in color including the right medial lower extremity, right third toe, left medial lower leg left dorsal foot. He has a new area on the right medial calf. These are all very small punched out areas that do not have the appearance of venous wounds. As noted previous biopsy we did in this clinic was negative for vasculitis/rheumatoid vasculitis 3/18; patient is due to see Dr. Donnetta Hutching about reflux studies on 3/30. All the wounds on his legs and ankles are healed. He still has a fairly sizable area on the left dorsal foot just proximal to the base of the second toe. Objective Constitutional Sitting or standing Blood Pressure is within target range for patient.. Pulse regular and within target range for patient.Marland Kitchen Respirations regular, non-labored and within target range.. Temperature is normal and within the target range for the patient.Marland Kitchen Appears in no distress. Vitals Time Taken: 12:58 PM, Height: 63 in, Weight: 162 lbs, BMI: 28.7, Temperature: 98.4 F, Pulse: 84 bpm, Respiratory Rate: 18 breaths/min, Blood Pressure: 119/65 mmHg. General Notes: Wound exam; the patient continues to  have skin changes of chronic venous insufficiency in his lower legs and ankles however there is no open wounds. He has an area on the base of his left second toe dorsally. Debrided with a #3 curette of adherent fibrinous surface debris hemostasis with direct pressure this actually looks some larger ooOn the dorsal right third toe there is a very tiny area which I think is just about ready to close. Integumentary (Hair, Skin) Wound #10 status is Healed - Epithelialized. Original cause of wound was Gradually Appeared. The wound is located on the Right,Medial Ankle. The wound measures 0cm length x 0cm width x 0cm depth; 0cm^2 area and 0cm^3 volume. There is no tunneling or undermining noted. There is a none present amount of drainage noted. The wound margin is distinct with the outline attached to the wound base. There is no granulation within the wound bed. There is no necrotic tissue within the wound bed. Wound #11 status is Open. Original cause of wound was Gradually Appeared. The wound is located on the Right Toe Third. The wound measures 0.3cm length x 0.3cm width x 0.1cm depth; 0.071cm^2 area and 0.007cm^3 volume. There is Fat Layer (Subcutaneous Tissue) Exposed exposed. There is no tunneling or undermining noted. There is a small amount of serosanguineous drainage noted. The wound margin is distinct with the outline attached to the wound base. There is large (67-100%) red granulation within the wound bed. There  is no necrotic tissue within the wound bed. Wound #12 status is Healed - Epithelialized. Original cause of wound was Gradually Appeared. The wound is located on the Left,Medial Lower Leg. The wound measures 0cm length x 0cm width x 0cm depth; 0cm^2 area and 0cm^3 volume. There is no tunneling or undermining noted. There is a none present amount of drainage noted. The wound margin is distinct with the outline attached to the wound base. There is no granulation within the wound  bed. There is no necrotic tissue within the wound bed. Wound #5 status is Open. Original cause of wound was Gradually Appeared. The wound is located on the Left,Dorsal Foot. The wound measures 2cm length x 1.3cm width x 0.2cm depth; 2.042cm^2 area and 0.408cm^3 volume. There is Fat Layer (Subcutaneous Tissue) Exposed exposed. There is no tunneling or undermining noted. There is a medium amount of serosanguineous drainage noted. The wound margin is distinct with the outline attached to the wound base. There is medium (34-66%) pink granulation within the wound bed. There is a medium (34-66%) amount of necrotic tissue within the wound bed including Adherent Slough. Wound #9 status is Healed - Epithelialized. Original cause of wound was Gradually Appeared. The wound is located on the Right,Medial Lower Leg. The wound measures 0cm length x 0cm width x 0cm depth; 0cm^2 area and 0cm^3 volume. There is Fat Layer (Subcutaneous Tissue) Exposed exposed. There is no tunneling or undermining noted. There is a small amount of serosanguineous drainage noted. The wound margin is flat and intact. There is large (67- 100%) pink granulation within the wound bed. There is no necrotic tissue within the wound bed. Assessment Active Problems ICD-10 Non-pressure chronic ulcer of other part of left foot limited to breakdown of skin Chronic venous hypertension (idiopathic) with ulcer and inflammation of right lower extremity Non-pressure chronic ulcer of right calf limited to breakdown of skin Other rheumatoid arthritis with rheumatoid factor of unspecified site Procedures Wound #11 Pre-procedure diagnosis of Wound #11 is an Inflammatory located on the Right Toe Third . There was a Excisional Skin/Subcutaneous Tissue Debridement with a total area of 0.09 sq cm performed by Ricard Dillon., MD. With the following instrument(s): Curette to remove Viable and Non-Viable tissue/material. Material removed includes  Subcutaneous Tissue, Slough, Skin: Dermis, and Fibrin/Exudate after achieving pain control using Lidocaine 4% Topical Solution. A time out was conducted at 13:30, prior to the start of the procedure. A Moderate amount of bleeding was controlled with Pressure. The procedure was tolerated well with a pain level of 0 throughout and a pain level of 0 following the procedure. Post Debridement Measurements: 0.3cm length x 0.3cm width x 0.1cm depth; 0.007cm^3 volume. Character of Wound/Ulcer Post Debridement is improved. Post procedure Diagnosis Wound #11: Same as Pre-Procedure Wound #5 Pre-procedure diagnosis of Wound #5 is an Inflammatory located on the Left,Dorsal Foot . There was a Excisional Skin/Subcutaneous Tissue Debridement with a total area of 2.6 sq cm performed by Ricard Dillon., MD. With the following instrument(s): Curette to remove Viable and Non-Viable tissue/material. Material removed includes Subcutaneous Tissue, Slough, Skin: Dermis, and Fibrin/Exudate after achieving pain control using Lidocaine 4% Topical Solution. A time out was conducted at 13:30, prior to the start of the procedure. A Moderate amount of bleeding was controlled with Pressure. The procedure was tolerated well with a pain level of 0 throughout and a pain level of 0 following the procedure. Post Debridement Measurements: 2cm length x 1.3cm width x 0.2cm depth; 0.408cm^3 volume. Character  of Wound/Ulcer Post Debridement is improved. Post procedure Diagnosis Wound #5: Same as Pre-Procedure Pre-procedure diagnosis of Wound #5 is an Inflammatory located on the Left,Dorsal Foot . There was a Three Layer Compression Therapy Procedure by Baruch Gouty, RN. Post procedure Diagnosis Wound #5: Same as Pre-Procedure There was a Three Layer Compression Therapy Procedure by Baruch Gouty, RN. Post procedure Diagnosis Wound #: Same as Pre-Procedure Plan Follow-up Appointments: Return Appointment in 2 weeks. -  Thursday Dressing Change Frequency: Change dressing three times week. - twice a week by home health. Skin Barriers/Peri-Wound Care: TCA Cream or Ointment - liberally to both legs, feet, toes, and all wounds in clinic and at home by home health. Wound Cleansing: May shower and wash wound with soap and water. - with dressing changes. Primary Wound Dressing: Wound #11 Right Toe Third: Calcium Alginate with Silver Wound #5 Left,Dorsal Foot: Calcium Alginate with Silver Secondary Dressing: Wound #11 Right Toe Third: Kerlix/Rolled Gauze Dry Gauze Wound #5 Left,Dorsal Foot: Dry Gauze Edema Control: 3 Layer Compression System - Bilateral Avoid standing for long periods of time Elevate legs to the level of the heart or above for 30 minutes daily and/or when sitting, a frequency of: - throughout the day. Support Garment 20-30 mm/Hg pressure to: - patient to call and purchase compression stockings from [employer]. Bring in stockings on your next wound care visit. Home Health: [place] [facility] for wound care. - [facility]. 1. I continued with silver alginate to both wound areas 2. He has an appointment with Dr. Donnetta Hutching on 3/30 to review the issue of venous reflux. 3. At 1 point I thought his wounds were possibly rheumatoid vasculitis but I do not think his rheumatologist agreed although he did stop his Remicade Electronic Signature(s) Signed: 07/11/2019 7:02:26 PM By: Linton Ham MD Entered By: Linton Ham on 07/11/2019 13:57:52 -------------------------------------------------------------------------------- SuperBill Details Patient Name: Date of Service: Jonathan [name], Jonathan [name] 07/11/2019 Medical Record [id number] Patient Account Number: [id number] Date of Birth/Sex: Treating RN: Jan 09, 1961 (59 y.o. [name] Primary Care Provider: Sinclair Ship Other Clinician: Referring Provider: Treating Provider/Extender:Roshonda Sperl, Gustavo Lah,  Cynda Familia in Treatment: 26 Diagnosis Coding ICD-10 Codes Code Description L97.521 Non-pressure chronic ulcer of other part of left foot limited to breakdown of skin L97.821 Non-pressure chronic ulcer of other part of left lower leg limited to breakdown of skin I87.331 Chronic venous hypertension (idiopathic) with ulcer and inflammation of right lower extremity M05.80 Other rheumatoid arthritis with rheumatoid factor of unspecified site L97.211 Non-pressure chronic ulcer of right calf limited to breakdown of skin L97.311 Non-pressure chronic ulcer of right ankle limited to breakdown of skin Facility Procedures The patient participates with Medicare or their insurance follows the Medicare Facility Guidelines: CPT4 Code Description Modifier Quantity [id number] - DEB SUBQ TISSUE 20 SQ CM/< 1 ICD-10 Diagnosis Description L97.521 Non-pressure chronic ulcer of  other part of left foot limited to breakdown of skin L97.211 Non-pressure chronic ulcer of right calf limited to breakdown of skin Physician Procedures CPT4 Code Description: [id number] - WC PHYS SUBQ TISS 20 SQ CM ICD-10 Diagnosis Description L97.521 Non-pressure chronic ulcer of other part of left foot limite L97.211 Non-pressure chronic ulcer of right calf limited to breakdow Modifier: d to breakdo n of skin Quantity: 1 wn of skin Electronic Signature(s) Signed: 07/11/2019 7:02:26 PM By: Linton Ham MD Entered By: Linton Ham on 07/11/2019 13:58:03

## 2019-07-15 NOTE — Progress Notes (Signed)
Jonathan, [name] ([id number]) Visit Report for 07/11/2019 Arrival Information Details Patient Name: Date of Service: Jonathan [name], Jonathan [name] 07/11/2019 1:00 PM Medical Record [id number] Patient Account Number: [id number] Date of Birth/Sex: Treating RN: March 29, 1961 (59 y.o. [name] Primary Care Koron Godeaux: [employer] Other Clinician: Referring Jobani Sabado: Treating Areej Tayler/Extender:Robson, Gustavo Lah, Cynda Familia in Treatment: 26 Visit Information History Since Last Visit Added or deleted any medications: No Patient Arrived: Crutches Any new allergies or adverse reactions: No Arrival Time: 12:56 Had a fall or experienced change in No Accompanied By: alone activities of daily living that may affect Transfer Assistance: None risk of falls: Patient Identification Verified: Yes Signs or symptoms of abuse/neglect since last No Secondary Verification Process Yes visito Completed: Hospitalized since last visit: No Patient Requires Transmission- No Implantable device outside of the clinic excluding No Based Precautions: cellular tissue based products placed in the center Patient Has Alerts: Yes since last visit: Patient Alerts: Patient on Blood Has Dressing in Place as Prescribed: Yes Thinner Has Compression in Place as Prescribed: Yes ABI Right .98 Pain Present Now: Yes ABI Left 1.01 Electronic Signature(s) Signed: 07/15/2019 5:18:21 PM By: [name] RN, BSN Entered By: [name] on 07/11/2019 12:59:31 -------------------------------------------------------------------------------- Compression Therapy Details Patient Name: Date of Service: Jonathan, [name] 07/11/2019 1:00 PM Medical Record [id number] Patient Account Number: [id number] Date of Birth/Sex: Treating RN: 1960-09-28 (59 y.o. [name] Primary Care Basia Mcginty: [employer] Other Clinician: Referring Alsie Younes: Treating Bonham Zingale/Extender:Robson, Gustavo Lah, Cynda Familia in Treatment:  26 Compression Therapy Performed for Wound Wound #5 Left,Dorsal Foot Assessment: Performed By: Clinician Baruch Gouty, RN Compression Type: Three Layer Post Procedure Diagnosis Same as Pre-procedure Electronic Signature(s) Signed: 07/11/2019 4:55:35 PM By: Deon Pilling Entered By: Deon Pilling on 07/11/2019 13:36:24 -------------------------------------------------------------------------------- Compression Therapy Details Patient Name: Date of Service: Jonathan, [name] 07/11/2019 1:00 PM Medical Record [id number] Patient Account Number: [id number] Date of Birth/Sex: Treating RN: Nov 17, 1960 (59 y.o. [name] Primary Care Jazyah Butsch: [employer] Other Clinician: Referring Jalien Weakland: Treating Geoge Lawrance/Extender:Robson, Gustavo Lah, Cynda Familia in Treatment: 26 Compression Therapy Performed for Wound NonWound Condition Lymphedema - Right Leg Assessment: Performed By: Clinician Baruch Gouty, RN Compression Type: Three Layer Post Procedure Diagnosis Same as Pre-procedure Electronic Signature(s) Signed: 07/11/2019 4:55:35 PM By: Deon Pilling Entered By: Deon Pilling on 07/11/2019 13:36:38 -------------------------------------------------------------------------------- Encounter Discharge Information Details Patient Name: Date of Service: Jonathan, [name] 07/11/2019 1:00 PM Medical Record [id number] Patient Account Number: [id number] Date of Birth/Sex: Treating RN: [birth year] (59 y.o. [name] Primary Care Destyn Parfitt: [employer] Other Clinician: Referring Iasiah Ozment: Treating Amyah Clawson/Extender:Robson, Gustavo Lah, Cynda Familia in Treatment: 26 Encounter Discharge Information Items Post Procedure Vitals Discharge Condition: Stable Temperature (F): 98.4 Ambulatory Status: Crutches Pulse (bpm): 84 Discharge Destination: Home Respiratory Rate (breaths/min): 18 Transportation: Private Auto Blood Pressure (mmHg): 119/65 Accompanied By:  alone Schedule Follow-up Appointment: Yes Clinical Summary of Care: Patient Declined Electronic Signature(s) Signed: 07/15/2019 5:18:21 PM By: [name] RN, BSN Entered By: [name] on 07/11/2019 14:54:05 -------------------------------------------------------------------------------- Lower Extremity Assessment Details Patient Name: Date of Service: Jonathan, [name] 07/11/2019 1:00 PM Medical Record [id number] Patient Account Number: [id number] Date of Birth/Sex: Treating RN: 1960/05/30 (59 y.o. [name] Primary Care Skylen Spiering: [employer] Other Clinician: Referring Sherrina Zaugg: Treating Morayo Leven/Extender:Robson, Gustavo Lah, Cynda Familia in Treatment: 26 Edema Assessment Assessed: [Left: No] [Right: No] Edema: [Left: Yes] [Right: Yes] Calf Left: Right: Point of Measurement: 36 cm From Medial Instep 28 cm 28 cm Ankle Left: Right: Point of Measurement: 8 cm From Medial Instep 21.2 cm 21.4  cm Vascular Assessment Pulses: Dorsalis Pedis Palpable: [Left:Yes] [Right:Yes] Electronic Signature(s) Signed: 07/15/2019 5:18:21 PM By: [name] RN, BSN Entered By: [name] on 07/11/2019 13:14:49 -------------------------------------------------------------------------------- Multi Wound Chart Details Patient Name: Date of Service: Jonathan, [name] 07/11/2019 1:00 PM Medical Record [id number] Patient Account Number: [id number] Date of Birth/Sex: Treating RN: 1960/09/16 (59 y.o. [name] Primary Care Shemeca Lukasik: [employer] Other Clinician: Referring Cannan Beeck: Treating Dallan Schonberg/Extender:Robson, Gustavo Lah, Cynda Familia in Treatment: 26 Vital Signs Height(in): 63 Pulse(bpm): 84 Weight(lbs): 162 Blood Pressure(mmHg): 119/65 Body Mass Index(BMI): 29 Temperature(F): 98.4 Respiratory 18 Rate(breaths/min): Photos: [10:No Photos] [11:No Photos] [12:No Photos] Wound Location: [10:Right Ankle - Medial] [11:Right Toe Third] [12:Left Lower  Leg - Medial] Wounding Event: [10:Gradually Appeared] [11:Gradually Appeared] [12:Gradually Appeared] Primary Etiology: [10:Inflammatory] [11:Inflammatory] [12:Inflammatory] Comorbid History: [10:Rheumatoid Arthritis, Osteoarthritis] [11:Rheumatoid Arthritis, Osteoarthritis] [12:Rheumatoid Arthritis, Osteoarthritis] Date Acquired: [10:03/28/2019] [11:05/01/2019] [12:06/27/2019] Weeks of Treatment: [10:14] [11:10] [12:2] Wound Status: [10:Healed - Epithelialized] [11:Open] [12:Healed - Epithelialized] Measurements L x W x D 0x0x0 [11:0.3x0.3x0.1] [12:0x0x0] (cm) Area (cm) : [10:0] [11:0.071] [12:0] Volume (cm) : [10:0] [11:0.007] [12:0] % Reduction in Area: [10:100.00%] [11:91.00%] [12:100.00%] % Reduction in Volume: 100.00% [11:91.10%] [12:100.00%] Classification: [10:Full Thickness Without Exposed Support Structures Exposed Support Structures Exposed Support Structures] [11:Full Thickness With] [12:Full Thickness Without] Exudate Amount: [10:None Present] [11:Small] [12:None Present] Exudate Type: [10:N/A] [11:Serosanguineous] [12:N/A] Exudate Color: [10:N/A] [11:red, brown] [12:N/A] Wound Margin: [10:Distinct, outline attached Distinct, outline attached Distinct, outline attached] Granulation Amount: [10:None Present (0%)] [11:Large (67-100%)] [12:None Present (0%)] Granulation Quality: [10:N/A] [11:Red] [12:N/A] Necrotic Amount: [10:None Present (0%)] [11:None Present (0%)] [12:None Present (0%)] Exposed Structures: [10:Fascia: No Fat Layer (Subcutaneous Tissue) Exposed: Yes Tissue) Exposed: No Tendon: No Muscle: No Joint: No Bone: No] [11:Fat Layer (Subcutaneous Fascia: No Fascia: No Tendon: No Muscle: No Joint: No Bone: No] [12:Fat Layer (Subcutaneous Tissue)  Exposed: No Tendon: No Muscle: No Joint: No Bone: No] Epithelialization: [10:Large (67-100%)] [11:Large (67-100%)] [12:Large (67-100%)] Debridement: [10:N/A] [11:Debridement - Excisional N/A] Pre-procedure [10:N/A] [11:13:30]  [12:N/A] Verification/Time Out Taken: Pain Control: [10:N/A] [11:Lidocaine 4% Topical Solution] [12:N/A] Tissue Debrided: [10:N/A] [11:Subcutaneous, Slough] [12:N/A] Level: [10:N/A] [11:Skin/Subcutaneous Tissue N/A] Debridement Area (sq cm):N/A [11:0.09] [12:N/A] Instrument: [10:N/A] [11:Curette] [12:N/A] Bleeding: [10:N/A] [11:Moderate] [12:N/A] Hemostasis Achieved: [10:N/A] [11:Pressure] [12:N/A] Procedural Pain: [10:N/A] [11:0] [12:N/A] Post Procedural Pain: [10:N/A] [11:0] [12:N/A] Debridement Treatment N/A [11:Procedure was tolerated] [12:N/A] Response: [11:well] Post Debridement [10:N/A] [11:0.3x0.3x0.1] [12:N/A] Measurements L x W x D (cm) Post Debridement [10:N/A] [11:0.007] [12:N/A] Volume: (cm) Procedures Performed: [10:N/A 5] [11:Debridement 9] [12:N/A N/A] Photos: [10:No Photos] [11:No Photos] [12:N/A] Wound Location: [10:Left, Dorsal Foot] [11:Right, Medial Lower Leg] [12:N/A] Wounding Event: [10:Gradually Appeared] [11:Gradually Appeared] [12:N/A] Primary Etiology: [10:Inflammatory] [11:Inflammatory] [12:N/A] Comorbid History: [10:Rheumatoid Arthritis, Osteoarthritis] [11:Rheumatoid Arthritis, Osteoarthritis] [12:N/A] Date Acquired: [10:11/24/2018] [11:03/28/2019] [12:N/A] Weeks of Treatment: [10:26] [11:14] [12:N/A] Wound Status: [10:Open] [11:Healed - Epithelialized] [12:N/A] Measurements L x W x D 2x1.3x0.2 [11:0x0x0] [12:N/A] (cm) Area (cm) : [10:2.042] [11:0] [12:N/A] Volume (cm) : [10:0.408] [11:0] [12:N/A] % Reduction in Area: [10:78.60%] [11:100.00%] [12:N/A] % Reduction in Volume: 57.30% [11:100.00%] [12:N/A] Classification: [10:Full Thickness Without Exposed Support Structures Exposed Support Structures] [11:Full Thickness Without] [12:N/A] Exudate Amount: [10:Medium] [11:Small] [12:N/A] Exudate Type: [10:Serosanguineous] [11:Serosanguineous] [12:N/A] Exudate Color: [10:red, brown] [11:red, brown] [12:N/A] Wound Margin: [10:Distinct, outline attached Flat  and Intact] [12:N/A] Granulation Amount: [10:Medium (34-66%)] [11:Large (67-100%)] [12:N/A] Granulation Quality: [10:Pink] [11:Pink] [12:N/A] Necrotic Amount: [10:Medium (34-66%)] [11:None Present (0%)] [12:N/A] Exposed Structures: [10:Fat Layer (Subcutaneous Fat Layer (Subcutaneous N/A Tissue) Exposed: Yes Fascia: No Tendon: No Muscle:  No Joint: No Bone: No] [11:Tissue) Exposed: Yes Fascia: No Tendon: No Muscle: No Joint: No Bone: No] Epithelialization: [10:Small (1-33%)] [11:Large (67-100%)] [12:N/A] Debridement: [10:Debridement - Excisional N/A] [12:N/A] Pre-procedure [10:13:30] [11:N/A] [12:N/A] Verification/Time Out Taken: Pain Control: [10:Lidocaine 4% Topical Solution] [11:N/A] [12:N/A] Tissue Debrided: [10:Subcutaneous, Slough] [11:N/A] [12:N/A] Level: [10:Skin/Subcutaneous Tissue N/A] [12:N/A] Debridement Area (sq cm):2.6 [11:N/A] [12:N/A] Instrument: [10:Curette] [11:N/A] [12:N/A] Bleeding: [10:Moderate] [11:N/A] [12:N/A] Hemostasis Achieved: [10:Pressure] [11:N/A] [12:N/A] Procedural Pain: [10:0] [11:N/A] [12:N/A] Post Procedural Pain: [10:0] [11:N/A] [12:N/A] Debridement Treatment Procedure was tolerated [11:N/A] [12:N/A] Response: [10:well] Post Debridement [10:2x1.3x0.2] [11:N/A] [12:N/A] Measurements L x W x D (cm) Post Debridement [10:0.408] [11:N/A] [12:N/A] Volume: (cm) Procedures Performed: Compression Therapy [10:Debridement] [11:N/A] [12:N/A] Electronic Signature(s) Signed: 07/11/2019 4:55:35 PM By: Deon Pilling Signed: 07/11/2019 7:02:26 PM By: Linton Ham MD Entered By: Linton Ham on 07/11/2019 13:53:00 -------------------------------------------------------------------------------- Pain Assessment Details Patient Name: Date of Service: AYO, DEJARDIN 07/11/2019 1:00 PM Medical Record [id number] Patient Account Number: [id number] Date of Birth/Sex: Treating RN: [birth year] (59 y.o. [name] Primary Care Caleel Kiner: [employer] Other Clinician: Referring Armand Preast: Treating Kebin Maye/Extender:Robson, Gustavo Lah, Cynda Familia in Treatment: 26 Active Problems Location of Pain Severity and Description of Pain Patient Has Paino Yes Site Locations Pain Location: Pain in Ulcers Rate the pain. Current Pain Level: 6 Character of Pain Describe the Pain: Throbbing Pain Management and Medication Current Pain Management: Medication: Yes Cold Application: No Rest: No Massage: No Activity: No T.E.N.S.: No Heat Application: No Leg drop or elevation: No Is the Current Pain Management Adequate: Adequate How does your wound impact your activities of daily livingo Sleep: No Bathing: No Appetite: No Relationship With Others: No Bladder Continence: No Emotions: No Bowel Continence: No Work: No Toileting: No Drive: No Dressing: No Hobbies: No Electronic Signature(s) Signed: 07/15/2019 5:18:21 PM By: [name] RN, BSN Entered By: [name] on 07/11/2019 12:59:25 -------------------------------------------------------------------------------- Wound Assessment Details Patient Name: Date of Service: BREXTYN, LINWOOD 07/11/2019 1:00 PM Medical Record [id number] Patient Account Number: [id number] Date of Birth/Sex: Treating RN: [birth year] (59 y.o. [name] Primary Care Munir Victorian: [employer] Other Clinician: Referring Calub Tarnow: Treating Brittony Billick/Extender:Robson, Gustavo Lah, LORI Weeks in Treatment: 26 Wound Status Wound Number: 10 Primary Etiology: Inflammatory Wound Location: Right Ankle - Medial Wound Status: Healed - Epithelialized Wounding Event: Gradually Appeared Comorbid History: Rheumatoid Arthritis, Osteoarthritis Date Acquired: 03/28/2019 Weeks Of Treatment: 14 Clustered Wound: No Photos Photo Uploaded By: Mikeal Hawthorne on 07/12/2019 15:54:26 Wound Measurements Length: (cm) 0 % Reduct Width: (cm) 0 % Reduct Depth: (cm) 0 Epitheli Area: (cm) 0  Tunneli Volume: (cm) 0 Undermi Wound Description Full Thickness Without Exposed Support Foul Odo Classification: Structures Slough/F Wound Distinct, outline attached Margin: Exudate None Present Amount: Wound Bed Granulation Amount: None Present (0%) Necrotic Amount: None Present (0%) Fascia E Fat Layer Tendon Exp Muscle Exp Joint Expo Bone Expos r After Cleansing: No ibrino No Exposed Structure xposed: No (Subcutaneous Tissue) Exposed: No osed: No osed: No sed: No ed: No ion in Area: 100% ion in Volume: 100% alization: Large (67-100%) ng: No ning: No Electronic Signature(s) Signed: 07/15/2019 5:18:21 PM By: [name] RN, BSN Entered By: [name] on 07/11/2019 13:16:17 -------------------------------------------------------------------------------- Wound Assessment Details Patient Name: Date of Service: KUMAIL, TREMAIN 07/11/2019 1:00 PM Medical Record [id number] Patient Account Number: [id number] Date of Birth/Sex: Treating RN: Aug 09, 1960 (59 y.o. [name] Primary Care Cliffton Spradley: [employer] Other Clinician: Referring Jenissa Tyrell: Treating Wai Minotti/Extender:Robson, Gustavo Lah, Cynda Familia in Treatment: 26 Wound Status Wound Number: [id number]  Etiology: Inflammatory Wound Location: Right Toe Third Wound Status: Open Wounding Event: Gradually Appeared Comorbid History: Rheumatoid Arthritis, Osteoarthritis Date Acquired: 05/01/2019 Weeks Of Treatment: 10 Clustered Wound: No Photos Photo Uploaded By: Mikeal Hawthorne on 07/12/2019 15:55:24 Wound Measurements Length: (cm) 0.3 % Reductio Width: (cm) 0.3 % Reductio Depth: (cm) 0.1 Epithelial Area: (cm) 0.071 Tunneling Volume: (cm) 0.007 Undermini Wound Description Classification: Full Thickness With Exposed Support Foul Odor Structures Slough/Fi Wound Distinct, outline attached Margin: Exudate Exudate Small Amount: Exudate Type: Serosanguineous Exudate Color: red,  brown Wound Bed Granulation Amount: Large (67-100%) Granulation Quality: Red Fascia Exp Necrotic Amount: None Present (0%) Fat Layer Tendon Exp Muscle Exp Joint Expo Bone Expos After Cleansing: No brino No Exposed Structure osed: No (Subcutaneous Tissue) Exposed: Yes osed: No osed: No sed: No ed: No n in Area: 91% n in Volume: 91.1% ization: Large (67-100%) : No ng: No Treatment Notes Wound #11 (Right Toe Third) 1. Cleanse With Soap and water 3. Primary Dressing Applied Calcium Alginate Ag 4. Secondary Dressing Dry Gauze Roll Gauze 5. Secured With Recruitment consultant) Signed: 07/11/2019 4:55:35 PM By: Deon Pilling Entered By: Deon Pilling on 07/11/2019 13:31:31 -------------------------------------------------------------------------------- Wound Assessment Details Patient Name: Date of Service: LEANDREW, GRAMS 07/11/2019 1:00 PM Medical Record [id number] Patient Account Number: [id number] Date of Birth/Sex: Treating RN: 19-Feb-1961 (59 y.o. [name] Primary Care Warnie Belair: [employer] Other Clinician: Referring Dandrae Kustra: Treating Zakyah Yanes/Extender:Robson, Gustavo Lah, LORI Weeks in Treatment: 26 Wound Status Wound Number: 12 Primary Etiology: Inflammatory Wound Location: Left Lower Leg - Medial Wound Status: Healed - Epithelialized Wounding Event: Gradually Appeared Comorbid History: Rheumatoid Arthritis, Osteoarthritis Date Acquired: 06/27/2019 Weeks Of Treatment: 2 Clustered Wound: No Photos Photo Uploaded By: Mikeal Hawthorne on 07/12/2019 15:55:25 Wound Measurements Length: (cm) 0 % Reduct Width: (cm) 0 % Reduct Depth: (cm) 0 Epitheli Area: (cm) 0 Tunneli Volume: (cm) 0 Undermi Wound Description Classification: Full Thickness Without Exposed Support Foul Odo Structures Slough/F Wound Distinct, outline attached Margin: Exudate None Present Amount: Wound Bed Granulation Amount: None Present (0%) Necrotic Amount:  None Present (0%) Fascia E Fat Laye Tendon E Muscle E Joint Ex Bone Exp r After Cleansing: No ibrino No Exposed Structure xposed: No r (Subcutaneous Tissue) Exposed: No xposed: No xposed: No posed: No osed: No ion in Area: 100% ion in Volume: 100% alization: Large (67-100%) ng: No ning: No Electronic Signature(s) Signed: 07/15/2019 5:18:21 PM By: [name] RN, BSN Entered By: [name] on 07/11/2019 13:17:00 -------------------------------------------------------------------------------- Wound Assessment Details Patient Name: Date of Service: DIONISIO, SHOFF 07/11/2019 1:00 PM Medical Record [id number] Patient Account Number: [id number] Date of Birth/Sex: Treating RN: 01/30/1961 (59 y.o. [name] Primary Care Noboru Bidinger: [employer] Other Clinician: Referring Cutberto Winfree: Treating Lydon Vansickle/Extender:Robson, Gustavo Lah, Cynda Familia in Treatment: 26 Wound Status Wound Number: 5 Primary Etiology: Inflammatory Wound Location: Left, Dorsal Foot Wound Status: Open Wounding Event: Gradually Appeared Comorbid History: Rheumatoid Arthritis, Osteoarthritis Date Acquired: 11/24/2018 Weeks Of Treatment: 26 Clustered Wound: No Photos Photo Uploaded By: Mikeal Hawthorne on 07/12/2019 15:53:56 Wound Measurements Length: (cm) 2 % Reduct Width: (cm) 1.3 % Reduct Depth: (cm) 0.2 Epitheli Area: (cm) 2.042 Tunneli Volume: (cm) 0.408 Undermi Wound Description Classification: Full Thickness Without Exposed Support Foul Odo Structures Slough/F Wound Distinct, outline attached Margin: Exudate Medium Amount: Exudate Serosanguineous Type: Exudate red, brown Color: Wound Bed Granulation Amount: Medium (34-66%) Granulation Quality: Pink Fascia E Necrotic Amount: Medium (34-66%) Fat Laye Necrotic Quality: Adherent Slough Tendon E Muscle E Joint Ex Bone Exp r After  Cleansing: No ibrino Yes Exposed Structure xposed: No r (Subcutaneous Tissue) Exposed:  Yes xposed: No xposed: No posed: No osed: No ion in Area: 78.6% ion in Volume: 57.3% alization: Small (1-33%) ng: No ning: No Treatment Notes Wound #5 (Left, Dorsal Foot) 1. Cleanse With Soap and water 2. Periwound Care TCA Ointment 3. Primary Dressing Applied Calcium Alginate Ag 4. Secondary Dressing Dry Gauze 6. Support Layer Applied 3 layer compression wrap Electronic Signature(s) Signed: 07/11/2019 4:55:35 PM By: Deon Pilling Entered By: Deon Pilling on 07/11/2019 13:31:31 -------------------------------------------------------------------------------- Wound Assessment Details Patient Name: Date of Service: GIONNI, VILLAR 07/11/2019 1:00 PM Medical Record [id number] Patient Account Number: [id number] Date of Birth/Sex: Treating RN: 07/24/1960 (59 y.o. [name] Primary Care Aine Strycharz: [employer] Other Clinician: Referring Milessa Hogan: Treating Eisen Robenson/Extender:Robson, Gustavo Lah, Cynda Familia in Treatment: 26 Wound Status Wound Number: 9 Primary Etiology: Inflammatory Wound Location: Right, Medial Lower Leg Wound Status: Healed - Epithelialized Wounding Event: Gradually Appeared Comorbid History: Rheumatoid Arthritis, Osteoarthritis Date Acquired: 03/28/2019 Weeks Of Treatment: 14 Clustered Wound: No Photos Photo Uploaded By: Mikeal Hawthorne on 07/12/2019 15:54:55 Wound Measurements Length: (cm) 0 % Reduct Width: (cm) 0 % Reduct Depth: (cm) 0 Epitheli Area: (cm) 0 Tunneli Volume: (cm) 0 Undermi Wound Description Classification: Full Thickness Without Exposed Support Foul Odo Structures Slough/F Wound Flat and Intact Margin: Exudate Small Amount: Exudate Serosanguineous Type: Exudate red, brown Color: Wound Bed Granulation Amount: Large (67-100%) Granulation Quality: Pink Fascia Exp Necrotic Amount: None Present (0%) Fat Layer Tendon Exp Muscle Exp Joint Expo Bone Expos r After Cleansing: No ibrino No Exposed  Structure osed: No (Subcutaneous Tissue) Exposed: Yes osed: No osed: No sed: No ed: No ion in Area: 100% ion in Volume: 100% alization: Large (67-100%) ng: No ning: No Electronic Signature(s) Signed: 07/11/2019 4:55:35 PM By: Deon Pilling Entered By: Deon Pilling on 07/11/2019 13:31:32 -------------------------------------------------------------------------------- Vitals Details Patient Name: Date of Service: JULION, RIGOLI 07/11/2019 1:00 PM Medical Record [id number] Patient Account Number: [id number] Date of Birth/Sex: Treating RN: [birth year] (59 y.o. [name] Primary Care Adilynn Bessey: [employer] Other Clinician: Referring Aleaya Latona: Treating Raynesha Tiedt/Extender:Robson, Gustavo Lah, Cynda Familia in Treatment: 26 Vital Signs Time Taken: 12:58 Temperature (F): 98.4 Height (in): 63 Pulse (bpm): 84 Weight (lbs): 162 Respiratory Rate (breaths/min): 18 Body Mass Index (BMI): 28.7 Blood Pressure (mmHg): 119/65 Reference Range: 80 - 120 mg / dl Electronic Signature(s) Signed: 07/15/2019 5:18:21 PM By: [name] RN, BSN Entered By: [name] on 07/11/2019 12:59:06

## 2019-07-19 ENCOUNTER — Telehealth (HOSPITAL_COMMUNITY): Payer: Self-pay

## 2019-07-19 NOTE — Telephone Encounter (Signed)

## 2019-07-23 ENCOUNTER — Encounter: Payer: Self-pay | Admitting: Vascular Surgery

## 2019-07-23 ENCOUNTER — Other Ambulatory Visit: Payer: Self-pay

## 2019-07-23 ENCOUNTER — Ambulatory Visit (INDEPENDENT_AMBULATORY_CARE_PROVIDER_SITE_OTHER): Payer: Medicare Other | Admitting: Vascular Surgery

## 2019-07-23 VITALS — BP 107/71 | HR 111 | Temp 98.5°F | Resp 18 | Ht 63.0 in | Wt 160.0 lb

## 2019-07-23 DIAGNOSIS — I83893 Varicose veins of bilateral lower extremities with other complications: Secondary | ICD-10-CM | POA: Diagnosis not present

## 2019-07-23 NOTE — Progress Notes (Signed)
[facility] [place]  Patient name: Jonathan [name] MRN: [id number] DOB: 08-25-1960 Sex: male  REASON FOR CONSULT: Evaluation venous hypertension both lower extremities  HPI: Jonathan [name] is a 59 y.o. male, who is here today for evaluation of venous hypertension.  He has venous stasis disease with venous stasis ulceration bilaterally.  This has been treated for several months and has had complete healing on the right leg and continued open wound on the dorsum of his left foot.  He is in Haematologist placement every 2 weeks.  He has no history of arterial insufficiency.  He had hammertoe surgery a year ago and had normal lower extremity arterial and digital study at that time.  He denies any history of of DVT.  He has had prior bilateral knee replacements  Past Medical History:  Diagnosis Date  . Anemia    H/o using iron in the past   . Arthritis    rheumatoid  . Chronic back pain   . Hyperlipidemia   . Joint pain   . Joint swelling   . Rheumatoid arthritis(714.0)     Family History  Problem Relation Age of Onset  . Cancer Mother        ?  Marland Kitchen Hypertension Mother   . Heart failure Father   . Cancer Brother   . Anesthesia problems Neg Hx   . Colon cancer Neg Hx        mother had "3" types of CA- unsure about which one  . Esophageal cancer Neg Hx   . Rectal cancer Neg Hx   . Stomach cancer Neg Hx     SOCIAL HISTORY: Social History   Socioeconomic History  . Marital status: Single    Spouse name: Not on file  . Number of children: Not on file  . Years of education: Not on file  . Highest education level: Not on file  Occupational History  . Occupation: Disabled  Tobacco Use  . Smoking status: Former Smoker    Types: Cigarettes    Quit date: 04/25/2004    Years since quitting: 15.2  . Smokeless tobacco: Never Used  Substance and Sexual Activity  . Alcohol use: No  . Drug use: No  . Sexual activity: Never    Other Topics Concern  . Not on file  Social History Narrative  . Not on file   Social Determinants of Health   Financial Resource Strain:   . Difficulty of Paying Living Expenses:   Food Insecurity:   . Worried About Charity fundraiser in the Last Year:   . Arboriculturist in the Last Year:   Transportation Needs:   . Film/video editor (Medical):   Marland Kitchen Lack of Transportation (Non-Medical):   Physical Activity:   . Days of Exercise per Week:   . Minutes of Exercise per Session:   Stress:   . Feeling of Stress :   Social Connections:   . Frequency of Communication with Friends and Family:   . Frequency of Social Gatherings with Friends and Family:   . Attends Religious Services:   . Active Member of Clubs or Organizations:   . Attends Archivist Meetings:   Marland Kitchen Marital Status:   Intimate Partner Violence:   . Fear of Current or Ex-Partner:   . Emotionally Abused:   Marland Kitchen Physically Abused:   . Sexually Abused:     No Known Allergies  Current Outpatient Medications  Medication Sig  Dispense Refill  . atorvastatin (LIPITOR) 40 MG tablet Take 40 mg by mouth daily.    . cetirizine (ZYRTEC) 10 MG tablet Take 10 mg by mouth daily.     . ciprofloxacin (CIPRO) 500 MG tablet Take 1 tablet (500 mg total) by mouth 2 (two) times daily. 20 tablet 0  . fluticasone (FLONASE) 50 MCG/ACT nasal spray Place 1 spray into the nose daily.     Marland Kitchen gabapentin (NEURONTIN) 300 MG capsule Take 300 mg by mouth 2 (two) times daily.     Marland Kitchen inFLIXimab (REMICADE) 100 MG injection Inject 300 mg into the vein. Hasn't started yet    . montelukast (SINGULAIR) 10 MG tablet TAKE 1 TABLET BY MOUTH DAILY FOR ALLERGIES AND COUGH.    . oxyCODONE-acetaminophen (PERCOCET) 5-325 MG tablet Take 1 tablet by mouth every 6 (six) hours as needed for severe pain. 20 tablet 0  . sulfamethoxazole-trimethoprim (BACTRIM DS) 800-160 MG tablet Take 1 tablet by mouth 2 (two) times daily. 20 tablet 0  . triamcinolone cream  (KENALOG) 0.1 %      No current facility-administered medications for this visit.    REVIEW OF SYSTEMS:  [X]  denotes positive finding, [ ]  denotes negative finding Cardiac  Comments:  Chest pain or chest pressure:    Shortness of breath upon exertion:    Short of breath when lying flat:    Irregular heart rhythm:        Vascular    Pain in calf, thigh, or hip brought on by ambulation:    Pain in feet at night that wakes you up from your sleep:     Blood clot in your veins:    Leg swelling:  x       Pulmonary    Oxygen at home:    Productive cough:     Wheezing:         Neurologic    Sudden weakness in arms or legs:     Sudden numbness in arms or legs:     Sudden onset of difficulty speaking or slurred speech:    Temporary loss of vision in one eye:     Problems with dizziness:         Gastrointestinal    Blood in stool:     Vomited blood:         Genitourinary    Burning when urinating:     Blood in urine:        Psychiatric    Major depression:         Hematologic    Bleeding problems:    Problems with blood clotting too easily:        Skin    Rashes or ulcers:        Constitutional    Fever or chills:      PHYSICAL EXAM: Vitals:   07/23/19 1328  BP: 107/71  Pulse: (!) 111  Resp: 18  Temp: 98.5 F (36.9 C)  TempSrc: Temporal  SpO2: 99%  Weight: 160 lb (72.6 kg)  Height: 5\' 3"  (1.6 m)    GENERAL: The patient is a well-nourished male, in no acute distress. The vital signs are documented above. CARDIOVASCULAR: 2+ radial and 2-3+ popliteal pulses bilaterally PULMONARY: There is good air exchange  ABDOMEN: Soft and non-tender  MUSCULOSKELETAL: There are no major deformities or cyanosis. NEUROLOGIC: No focal weakness or paresthesias are detected. SKIN: Bilateral Unna boots in place were not removed. PSYCHIATRIC: The patient has a normal affect.  DATA:  Noninvasive studies revealed reflux in his great saphenous vein with dilatation  bilaterally.  Images saphenous vein.  This is straight course and dilated bilaterally.  MEDICAL ISSUES: Patient has combination of deep and superficial reflux contributing to his severe venous stasis disease.  He is CEAP class VI with open wounds.  I do feel that he will benefit from ablation of his great saphenous vein in a staged fashion bilaterally.  I would proceed first with the left leg since this has the worst ulcerations.  Then would proceed with right leg.  He will see Dr. Scot Dock or Fields for preoperative planning visit   Rosetta Posner, MD [facility] of [facility] Tel [phone] Pager [phone]

## 2019-07-31 ENCOUNTER — Encounter: Payer: Self-pay | Admitting: Vascular Surgery

## 2019-07-31 ENCOUNTER — Other Ambulatory Visit: Payer: Self-pay

## 2019-07-31 ENCOUNTER — Ambulatory Visit (INDEPENDENT_AMBULATORY_CARE_PROVIDER_SITE_OTHER): Payer: Medicare Other | Admitting: Vascular Surgery

## 2019-07-31 VITALS — BP 99/64 | HR 69 | Temp 98.0°F | Resp 20 | Ht 63.0 in | Wt 160.0 lb

## 2019-07-31 DIAGNOSIS — I83893 Varicose veins of bilateral lower extremities with other complications: Secondary | ICD-10-CM

## 2019-07-31 NOTE — Progress Notes (Signed)
Patient name: Jonathan [name] MRN: [id number] DOB: [birth year] Sex: male  REASON FOR VISIT:   Follow-up of venous disease.  HPI:   Jonathan [name] is a pleasant 59 y.o. male who was last seen a week ago by Dr. Sherren Mocha early with venous hypertension of both lower extremities.  He had venous stasis changes bilaterally and was having Unna boots replaced every 2 weeks.  He had surgery on his feet a year ago and at that time had normal arterial studies.  He has had bilateral knee replacements.  Noninvasive studies showed reflux in his great saphenous veins bilaterally and the veins were significantly dilated.  In addition he had deep venous reflux.  He had CEAP C4 venous disease with open wounds.  It was felt that he was a good candidate for laser ablation of the great saphenous veins in a staged fashion.  The plan was to start on the left leg first given that the ulcerations were worse on the left.  He comes in today to discuss this option.  On my history he continues to have significant ulceration on the left leg.  The right leg wounds have almost completely healed.  He still in [employer].  Past Medical History:  Diagnosis Date  . Anemia    H/o using iron in the past   . Arthritis    rheumatoid  . Chronic back pain   . Hyperlipidemia   . Joint pain   . Joint swelling   . Rheumatoid arthritis(714.0)     Family History  Problem Relation Age of Onset  . Cancer Mother        ?  Marland Kitchen Hypertension Mother   . Heart failure Father   . Cancer Brother   . Anesthesia problems Neg Hx   . Colon cancer Neg Hx        mother had "3" types of CA- unsure about which one  . Esophageal cancer Neg Hx   . Rectal cancer Neg Hx   . Stomach cancer Neg Hx     SOCIAL HISTORY: Social History   Tobacco Use  . Smoking status: Former Smoker    Types: Cigarettes    Quit date: 04/25/2004    Years since quitting: 15.2  . Smokeless tobacco: Never Used  Substance Use Topics  . Alcohol use: No    No Known  Allergies  Current Outpatient Medications  Medication Sig Dispense Refill  . atorvastatin (LIPITOR) 40 MG tablet Take 40 mg by mouth daily.    . cetirizine (ZYRTEC) 10 MG tablet Take 10 mg by mouth daily.     . ciprofloxacin (CIPRO) 500 MG tablet Take 1 tablet (500 mg total) by mouth 2 (two) times daily. 20 tablet 0  . cyclobenzaprine (FLEXERIL) 5 MG tablet Take 5 mg by mouth every 8 (eight) hours as needed.    . fluticasone (FLONASE) 50 MCG/ACT nasal spray Place 1 spray into the nose daily.     Marland Kitchen gabapentin (NEURONTIN) 300 MG capsule Take 300 mg by mouth 2 (two) times daily.     Marland Kitchen inFLIXimab (REMICADE) 100 MG injection Inject 300 mg into the vein. Hasn't started yet    . meloxicam (MOBIC) 15 MG tablet Take 15 mg by mouth daily.    . montelukast (SINGULAIR) 10 MG tablet TAKE 1 TABLET BY MOUTH DAILY FOR ALLERGIES AND COUGH.    Marland Kitchen omeprazole (PRILOSEC) 20 MG capsule Take by mouth.    . oxyCODONE-acetaminophen (PERCOCET) 5-325 MG tablet Take 1 tablet by  mouth every 6 (six) hours as needed for severe pain. 20 tablet 0  . predniSONE (DELTASONE) 5 MG tablet Take 5 mg by mouth 2 (two) times daily.    Marland Kitchen triamcinolone cream (KENALOG) 0.1 %      No current facility-administered medications for this visit.    REVIEW OF SYSTEMS:  [X]  denotes positive finding, [ ]  denotes negative finding Cardiac  Comments:  Chest pain or chest pressure:    Shortness of breath upon exertion:    Short of breath when lying flat:    Irregular heart rhythm:        Vascular    Pain in calf, thigh, or hip brought on by ambulation:    Pain in feet at night that wakes you up from your sleep:     Blood clot in your veins:    Leg swelling:         Pulmonary    Oxygen at home:    Productive cough:     Wheezing:         Neurologic    Sudden weakness in arms or legs:     Sudden numbness in arms or legs:     Sudden onset of difficulty speaking or slurred speech:    Temporary loss of vision in one eye:     Problems  with dizziness:         Gastrointestinal    Blood in stool:     Vomited blood:         Genitourinary    Burning when urinating:     Blood in urine:        Psychiatric    Major depression:         Hematologic    Bleeding problems:    Problems with blood clotting too easily:        Skin    Rashes or ulcers:        Constitutional    Fever or chills:     PHYSICAL EXAM:   Vitals:   07/31/19 1532  BP: 99/64  Pulse: 69  Resp: 20  Temp: 98 F (36.7 C)  SpO2: 95%  Weight: 160 lb (72.6 kg)  Height: 5\' 3"  (1.6 m)    GENERAL: The patient is a well-nourished male, in no acute distress. The vital signs are documented above. CARDIAC: There is a regular rate and rhythm.  VASCULAR:  I looked at both great saphenous veins myself with the SonoSite.  On the left side he has superficial venous reflux involving the left great saphenous vein from the saphenofemoral junction to above the knee.  The vein is dilated.  I would likely cannulate this in the mid to distal thigh.  On the right side was able to demonstrate reflux at the saphenofemoral junction.  He has reflux in the proximal saphenous vein and the vein is dilated up to 0.44 cm.  The vein would likely be cannulated in the mid thigh.  PULMONARY: There is good air exchange bilaterally without wheezing or rales. NEUROLOGIC: No focal weakness or paresthesias are detected. SKIN: He has Unna boots on both legs. PSYCHIATRIC: The patient has a normal affect.  DATA:    No new data  MEDICAL ISSUES:   CHRONIC VENOUS INSUFFICIENCY: This patient has CEAP C6 venous disease.  I agree with Dr. Luther Parody assessment that he is a candidate for staged laser ablation of the left great saphenous vein and then the right great saphenous vein. I have discussed the indications for  endovenous laser ablation of the left GSV, that is to lower the pressure in the veins and potentially help relieve the symptoms from venous hypertension. I have also discussed  alternative options including conservative treatment with leg elevation, compression therapy, exercise, avoiding prolonged sitting and standing, and weight management. I have discussed the potential complications of the procedure, including, but not limited to: bleeding, bruising, leg swelling, nerve injury, skin burns, significant pain from phlebitis, deep venous thrombosis, or failure of the vein to close.  I have also explained that venous insufficiency is a chronic disease, and that the patient is at risk for recurrent varicose veins in the future.  All of the patient's questions were encouraged and answered. They are agreeable to proceed.    Deitra Mayo [facility] Specialists of [facility] [phone]

## 2019-08-01 ENCOUNTER — Encounter (HOSPITAL_BASED_OUTPATIENT_CLINIC_OR_DEPARTMENT_OTHER): Payer: Medicare Other | Attending: Internal Medicine | Admitting: Internal Medicine

## 2019-08-01 DIAGNOSIS — L97521 Non-pressure chronic ulcer of other part of left foot limited to breakdown of skin: Secondary | ICD-10-CM | POA: Diagnosis not present

## 2019-08-01 DIAGNOSIS — I87331 Chronic venous hypertension (idiopathic) with ulcer and inflammation of right lower extremity: Secondary | ICD-10-CM | POA: Insufficient documentation

## 2019-08-01 DIAGNOSIS — I10 Essential (primary) hypertension: Secondary | ICD-10-CM | POA: Insufficient documentation

## 2019-08-01 DIAGNOSIS — L97511 Non-pressure chronic ulcer of other part of right foot limited to breakdown of skin: Secondary | ICD-10-CM | POA: Diagnosis not present

## 2019-08-01 DIAGNOSIS — L97311 Non-pressure chronic ulcer of right ankle limited to breakdown of skin: Secondary | ICD-10-CM | POA: Insufficient documentation

## 2019-08-01 DIAGNOSIS — M0580 Other rheumatoid arthritis with rheumatoid factor of unspecified site: Secondary | ICD-10-CM | POA: Diagnosis not present

## 2019-08-01 DIAGNOSIS — L97812 Non-pressure chronic ulcer of other part of right lower leg with fat layer exposed: Secondary | ICD-10-CM | POA: Insufficient documentation

## 2019-08-01 DIAGNOSIS — I2510 Atherosclerotic heart disease of native coronary artery without angina pectoris: Secondary | ICD-10-CM

## 2019-08-01 DIAGNOSIS — I872 Venous insufficiency (chronic) (peripheral): Secondary | ICD-10-CM | POA: Insufficient documentation

## 2019-08-01 DIAGNOSIS — Z7901 Long term (current) use of anticoagulants: Secondary | ICD-10-CM | POA: Insufficient documentation

## 2019-08-01 DIAGNOSIS — L97211 Non-pressure chronic ulcer of right calf limited to breakdown of skin: Secondary | ICD-10-CM | POA: Insufficient documentation

## 2019-08-01 DIAGNOSIS — E785 Hyperlipidemia, unspecified: Secondary | ICD-10-CM | POA: Diagnosis not present

## 2019-08-01 DIAGNOSIS — L97821 Non-pressure chronic ulcer of other part of left lower leg limited to breakdown of skin: Secondary | ICD-10-CM | POA: Insufficient documentation

## 2019-08-01 HISTORY — DX: Atherosclerotic heart disease of native coronary artery without angina pectoris: I25.10

## 2019-08-01 NOTE — Progress Notes (Signed)
JASKARN, SUIT ([id number]) Visit Report for 08/01/2019 HPI Details Patient Name: Date of Service: Jonathan [name], Jonathan [name] 08/01/2019 1:00 PM Medical Record [id number] Patient Account Number: [id number] Date of Birth/Sex: Treating RN: March 09, 1961 (59 y.o. [name] Primary Care Provider: Sinclair Ship Other Clinician: Referring Provider: Treating Provider/Extender:Jeanie Mccard, Gustavo Lah, Cynda Familia in Treatment: 29 History of Present Illness HPI Description: ADMISSION 01/10/2019 This is a 59 year old man who has rheumatoid arthritis on Remicade. He is not a diabetic. He had forefoot surgery by Dr. Bettye Boeck on June 25 for correction of hammertoes. At some point in the postop follow-up he presented with multiple wounds on the left foot. I do not really have a good description at this point but I will try to look through [facility] health link. He had arterial studies on 11/28/2018 that were within normal limits. Seen by podiatry on 8/24 with multiple left foot and lower extremity ulcers. He was treated with Bactrim and Cipro. He was hospitalized from 8/28 through 8/30 with wounds on the left anterior foot. An MRI was negative at that point the wounds were on the left anterior and left medial foot. Felt to have cellulitis. He saw his primary doctor in follow-up on 9/3 and felt to have pressure ulcers on the foot. The patient states that he developed these after some acute swelling last month. This may have been a result of infection. He comes in today with multiple wounds on his toes dorsal feet and distal lower extremities anteriorly. The exact cause of this is not completely clear. He does not have a prior wound history. He has multiple lower extremity wounds including the left medial foot, left dorsal foot, left second toe, left anterior mid tibia, right dorsal foot, right fourth toe medially and the right medial lower leg. The areas on the left medial foot and left dorsal foot are large  superficial wounds most of the rest of these are small punched out looking areas. He does not complain of a lot of pain Past medical history; hypertension, rheumatoid arthritis on Remicade infusions, hyperlipidemia right forefoot surgery on 6/25, he is on Xarelto at this point for reasons that are not totally clear. Arterial studies on 11/28/2018 showed an ABI of 0.98 on the right 1.01 on the left. TBI's of 0.79 on the right 0.75 on the left and triphasic waveforms bilaterally 9/24; wounds on his bilateral lower feet and lower legs. We have been using silver collagen on the wounds. 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 Follows with Dr. Raliegh Ip" rheumatology at [facility]. 10/1; patient with wounds on his bilateral lower feet and lower legs we have been using silver collagen. 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 He does not have macrovascular disease by previous noninvasive studies in August. 10/8; patient with wounds on his bilateral lower feet and legs. Small punched out areas. I have no doubt he has chronic venous disease however these wounds do not look like classic venous ulcers. He is generally been making good improvements. 10/15; patients with small wounds on his bilateral lower legs. He has nothing left on the right 3 wounds on the left are making good progress. On the left we have the left medial foot, left anterior tibial area and the larger area on the dorsal left foot in close proximity to the second  and third toes. All of these look a lot better. The patient has clear evidence of chronic venous insufficiency although these wounds did not look like venous insufficiency wounds 10/22; patient has no wounds left on the right. He has 3 wounds on the left  arm making good progress. Left medial foot left anterior tibia and the left dorsal foot. These are likely related to venous insufficiency. He has dark fibrotic adherent skin in his bilateral ankle areas. 11/5; still no open wounds on the right. Left medial foot and the left dorsal foot just proximal to the toes of the 2 remaining wounds. The area on the left anterior tibia is healed 11/19; 2-week follow-up. The area on the left medial foot is closed. The only remaining wound is on the left dorsal foot just proximal to the toes. The area on the left anterior tibia also had been previously healed. The patient sees his rheumatologist in early December. I asked him to mention the wounds to the rheumatologist. I had some thoughts about this being rheumatoid vasculitic related and was going to biopsy this although he seems to have done nicely with conservative treatment. The patient has his compression stocking 12/10; 3-week follow-up. He was seen by our nurses in the interim. The only thing he had last time he was here was the opening on the dorsal foot just proximal to the toes. He was wearing a stocking on the right leg. He says in the last week he developed open areas on the medial ankle and medial calf on the right. 05/02/2019; almost 4-week follow-up. He has the original wound in the left dorsal foot which is not a lot better certainly not worse. He had new wounds last time on the right medial ankle medial calf area and on the dorsal fourth toe today.. The latter is the new wound today. The patient probably has chronic venous insufficiency one would have to wonder about rheumatoid vasculitis and if these keep on forming I probably will biopsy one 1/21; the patient has a new wound on the right third toe which probes to bone. We still do not have an exact diagnosis of this. I went ahead and biopsied the right medial leg and the left dorsal foot these were shave biopsieso Vasculitis 2/4; biopsies I  did of the right medial lower leg and left dorsal foot showed stasis dermatitis without evidence of vasculitis. Special stain was negative for fungus. Fite stain was negative for Mycobacterium. We have been using Hydrofera Blue on the wounds on his legs and endoform to the right third toe. Everything looks a little better today 3/4; since the patient was last here he had his reflux studies. These showed no evidence of DVT. On the right there was reflux in the right greater saphenous vein right common femoral vein right saphenofemoral junction right popliteal vein. 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 On the left no evidence of a DVT there is no evidence of superficial venous reflux seen in the left small saphenous vein there was venous reflux noted in the left common femoral vein the left greater saphenous vein in the proximal thigh. I did not see much description of venous reflux in the lower extremities although he clearly has chronic venous changes in the skin in his lower legs. He also saw rheumatology at [facility] on 06/12/2019. They noted the nonhealing wounds in his lower extremities. Because of this they held his Remicade infusions. Started him on prednisone 10 mg a day I believe  5 twice daily. He was to return in 2 months. They did document normal arterial studies in August, the biopsy in 06/08/22 was negative for vasculitis fungi or Mycobacterium. His findings were suggestive of stasis dermatitis He has multiple small wounds bilaterally in color including the right medial lower extremity, right third toe, left medial lower leg left dorsal foot. He has a new area on the right medial calf. These are all very small punched out areas that do not have the appearance of venous wounds. As noted previous biopsy we did in this clinic was negative for vasculitis/rheumatoid vasculitis 3/18; patient is due to see Dr. Donnetta Hutching about reflux studies on 3/30. All the wounds on his legs and ankles  are healed. He still has a fairly sizable area on the left dorsal foot just proximal to the base of the second toe. 4/8; the patient was referred to Dr. Doren Custard by Dr. Donnetta Hutching. Dixon saw him on 4/7 which is yesterday. He is a candidate for staged laser ablation of the left greater saphenous vein and then the right greater saphenous vein. Apparently this is planned for June 5 on the left. We have been using silver alginate compressing him bilaterally. He ordered stockings from [facility] for some reason they have not come in yet Electronic Signature(s) Signed: 08/01/2019 5:58:56 PM By: Linton Ham MD Entered By: Linton Ham on 08/01/2019 14:18:32 -------------------------------------------------------------------------------- Physical Exam Details Patient Name: Date of Service: NAHSIR, IULIANO 08/01/2019 1:00 PM Medical Record [id number] Patient Account Number: [id number] Date of Birth/Sex: Treating RN: 03/26/1961 (59 y.o. [name] Primary Care Provider: Sinclair Ship Other Clinician: Referring Provider: Treating Provider/Extender:Celia Friedland, Gustavo Lah, Cynda Familia in Treatment: 29 Constitutional Sitting or standing Blood Pressure is within target range for patient.. Pulse regular and within target range for patient.Marland Kitchen Respirations regular, non-labored and within target range.. Temperature is normal and within the target range for the patient.Marland Kitchen Appears in no distress. Notes Wound exam; patient continues to have skin changes of chronic venous insufficiency his edema is controlled he has a single wound on the left foot and an area on the right third toe. Electronic Signature(s) Signed: 08/01/2019 5:58:56 PM By: Linton Ham MD Entered By: Linton Ham on 08/01/2019 14:19:28 -------------------------------------------------------------------------------- Physician Orders Details Patient Name: Date of Service: BUDD, LUCKE 08/01/2019 1:00 PM Medical Record  [id number] Patient Account Number: [id number] Date of Birth/Sex: Treating RN: [birth year] (59 y.o. [name] Primary Care Provider: Sinclair Ship Other Clinician: Referring Provider: Treating Provider/Extender:Madelynne Lasker, Gustavo Lah, Cynda Familia in Treatment: 29 Verbal / Phone Orders: No Diagnosis Coding ICD-10 Coding Code Description L97.521 Non-pressure chronic ulcer of other part of left foot limited to breakdown of skin I87.331 Chronic venous hypertension (idiopathic) with ulcer and inflammation of right lower extremity M05.80 Other rheumatoid arthritis with rheumatoid factor of unspecified site L97.211 Non-pressure chronic ulcer of right calf limited to breakdown of skin Follow-up Appointments Return Appointment in 1 week. - Thursday Dressing Change Frequency Change dressing three times week. - twice a week by home health. Skin Barriers/Peri-Wound Care TCA Cream or Ointment - liberally to both legs, feet, toes, and all wounds in clinic and at home by home health. Wound Cleansing May shower and wash wound with soap and water. - with dressing changes. Primary Wound Dressing Wound #11 Right Toe Third Calcium Alginate with Silver Wound #5 Left,Dorsal Foot Calcium Alginate with Silver Secondary Dressing Wound #11 Right Toe Third Kerlix/Rolled Gauze Dry Gauze Wound #5 Left,Dorsal Foot Dry Gauze Edema Control 3 Layer Compression System -  Bilateral Avoid standing for long periods of time Elevate legs to the level of the heart or above for 30 minutes daily and/or when sitting, a frequency of: - throughout the day. Support Garment 20-30 mm/Hg pressure to: - ***Bring in stockings on your next wound care visit.*** [facility] for wound care. - [facility]. Electronic Signature(s) Signed: 08/01/2019 5:58:56 PM By: Linton Ham MD Signed: 08/01/2019 6:08:37 PM By: Deon Pilling Entered By: Deon Pilling on 08/01/2019  14:14:48 -------------------------------------------------------------------------------- Problem List Details Patient Name: Date of Service: SASCHA, BONDER 08/01/2019 1:00 PM Medical Record [id number] Patient Account Number: [id number] Date of Birth/Sex: Treating RN: 1961-01-20 (59 y.o. Lorette Ang, Meta.Reding Primary Care Provider: Sinclair Ship Other Clinician: Referring Provider: Treating Provider/Extender:Chiana Wamser, Gustavo Lah, Cynda Familia in Treatment: 29 Active Problems ICD-10 Evaluated Encounter Code Description Active Date Today Diagnosis L97.521 Non-pressure chronic ulcer of other part of left foot 01/10/2019 No Yes limited to breakdown of skin I87.331 Chronic venous hypertension (idiopathic) with ulcer 01/10/2019 No Yes and inflammation of right lower extremity M05.80 Other rheumatoid arthritis with rheumatoid factor of 01/10/2019 No Yes unspecified site L97.211 Non-pressure chronic ulcer of right calf limited to 04/04/2019 No Yes breakdown of skin Inactive Problems ICD-10 Code Description Active Date Inactive Date L97.821 Non-pressure chronic ulcer of other part of left lower leg limited 01/10/2019 01/10/2019 to breakdown of skin L97.511 Non-pressure chronic ulcer of other part of right foot limited to 01/10/2019 01/10/2019 breakdown of skin L97.812 Non-pressure chronic ulcer of other part of right lower leg with 01/10/2019 01/10/2019 fat layer exposed L97.311 Non-pressure chronic ulcer of right ankle limited to breakdown 04/04/2019 04/04/2019 of skin Resolved Problems Electronic Signature(s) Signed: 08/01/2019 5:58:56 PM By: Linton Ham MD Entered By: Linton Ham on 08/01/2019 14:15:35 -------------------------------------------------------------------------------- Progress Note Details Patient Name: Date of Service: KAZUMA, AYALA 08/01/2019 1:00 PM Medical Record [id number] Patient Account Number: [id number] Date of Birth/Sex: Treating RN: [birth year]  (59 y.o. [name] Primary Care Provider: Sinclair Ship Other Clinician: Referring Provider: Treating Provider/Extender:Rowen Wilmer, Gustavo Lah, Cynda Familia in Treatment: 29 Subjective History of Present Illness (HPI) ADMISSION 01/10/2019 This is a 59 year old man who has rheumatoid arthritis on Remicade. He is not a diabetic. He had forefoot surgery by Dr. Bettye Boeck on June 25 for correction of hammertoes. At some point in the postop follow-up he presented with multiple wounds on the left foot. I do not really have a good description at this point but I will try to look through [facility] link. He had arterial studies on 11/28/2018 that were within normal limits. Seen by podiatry on 8/24 with multiple left foot and lower extremity ulcers. He was treated with Bactrim and Cipro. He was hospitalized from 8/28 through 8/30 with wounds on the left anterior foot. An MRI was negative at that point the wounds were on the left anterior and left medial foot. Felt to have cellulitis. He saw his primary doctor in follow-up on 9/3 and felt to have pressure ulcers on the foot. The patient states that he developed these after some acute swelling last month. This may have been a result of infection. He comes in today with multiple wounds on his toes dorsal feet and distal lower extremities anteriorly. The exact cause of this is not completely clear. He does not have a prior wound history. He has multiple lower extremity wounds including the left medial foot, left dorsal foot, left second toe, left anterior mid tibia, right dorsal foot, right fourth toe medially and the  right medial lower leg. The areas on the left medial foot and left dorsal foot are large superficial wounds most of the rest of these are small punched out looking areas. He does not complain of a lot of pain Past medical history; hypertension, rheumatoid arthritis on Remicade infusions, hyperlipidemia right forefoot surgery on 6/25, he  is on Xarelto at this point for reasons that are not totally clear. Arterial studies on 11/28/2018 showed an ABI of 0.98 on the right 1.01 on the left. TBI's of 0.79 on the right 0.75 on the left and triphasic waveforms bilaterally 9/24; wounds on his bilateral lower feet and lower legs. We have been using silver collagen on the wounds. 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 Follows with Dr. Raliegh Ip" rheumatology at [facility]. 10/1; patient with wounds on his bilateral lower feet and lower legs we have been using silver collagen. 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 He does not have macrovascular disease by previous noninvasive studies in August. 10/8; patient with wounds on his bilateral lower feet and legs. Small punched out areas. I have no doubt he has chronic venous disease however these wounds do not look like classic venous ulcers. He is generally been making good improvements. 10/15; patients with small wounds on his bilateral lower legs. He has nothing left on the right 3 wounds on the left are making good progress. On the left we have the left medial foot, left anterior tibial area and the larger area on the dorsal left foot in close proximity to the second and third toes. All of these look a lot better. The patient has clear evidence of chronic venous insufficiency although these wounds did not look like venous insufficiency wounds 10/22; patient has no wounds left on the right. He has 3 wounds on the left arm making good progress. Left medial foot left anterior tibia and the left dorsal foot. These are likely related to venous insufficiency. He has dark fibrotic adherent skin in his bilateral ankle areas. 11/5; still no open wounds on the right. Left  medial foot and the left dorsal foot just proximal to the toes of the 2 remaining wounds. The area on the left anterior tibia is healed 11/19; 2-week follow-up. The area on the left medial foot is closed. The only remaining wound is on the left dorsal foot just proximal to the toes. The area on the left anterior tibia also had been previously healed. The patient sees his rheumatologist in early December. I asked him to mention the wounds to the rheumatologist. I had some thoughts about this being rheumatoid vasculitic related and was going to biopsy this although he seems to have done nicely with conservative treatment. The patient has his compression stocking 12/10; 3-week follow-up. He was seen by our nurses in the interim. The only thing he had last time he was here was the opening on the dorsal foot just proximal to the toes. He was wearing a stocking on the right leg. He says in the last week he developed open areas on the medial ankle and medial calf on the right. 05/02/2019; almost 4-week follow-up. He has the original wound in the left dorsal foot which is not a lot better certainly not worse. He had new wounds last time on the right medial ankle  medial calf area and on the dorsal fourth toe today.. The latter is the new wound today. The patient probably has chronic venous insufficiency one would have to wonder about rheumatoid vasculitis and if these keep on forming I probably will biopsy one 1/21; the patient has a new wound on the right third toe which probes to bone. We still do not have an exact diagnosis of this. I went ahead and biopsied the right medial leg and the left dorsal foot these were shave biopsieso Vasculitis 2/4; biopsies I did of the right medial lower leg and left dorsal foot showed stasis dermatitis without evidence of vasculitis. Special stain was negative for fungus. Fite stain was negative for Mycobacterium. We have been using Hydrofera Blue on the wounds on his legs  and endoform to the right third toe. Everything looks a little better today 3/4; since the patient was last here he had his reflux studies. These showed no evidence of DVT. On the right there was reflux in the right greater saphenous vein right common femoral vein right saphenofemoral junction right popliteal vein. 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 On the left no evidence of a DVT there is no evidence of superficial venous reflux seen in the left small saphenous vein there was venous reflux noted in the left common femoral vein the left greater saphenous vein in the proximal thigh. I did not see much description of venous reflux in the lower extremities although he clearly has chronic venous changes in the skin in his lower legs. He also saw rheumatology at [facility] on 06/12/2019. They noted the nonhealing wounds in his lower extremities. Because of this they held his Remicade infusions. Started him on prednisone 10 mg a day I believe 5 twice daily. He was to return in 2 months. They did document normal arterial studies in August, the biopsy in 06-15-22 was negative for vasculitis fungi or Mycobacterium. His findings were suggestive of stasis dermatitis He has multiple small wounds bilaterally in color including the right medial lower extremity, right third toe, left medial lower leg left dorsal foot. He has a new area on the right medial calf. These are all very small punched out areas that do not have the appearance of venous wounds. As noted previous biopsy we did in this clinic was negative for vasculitis/rheumatoid vasculitis 3/18; patient is due to see Dr. Donnetta Hutching about reflux studies on 3/30. All the wounds on his legs and ankles are healed. He still has a fairly sizable area on the left dorsal foot just proximal to the base of the second toe. 4/8; the patient was referred to Dr. Doren Custard by Dr. Donnetta Hutching. Dixon saw him on 4/7 which is yesterday. He is a candidate for staged laser  ablation of the left greater saphenous vein and then the right greater saphenous vein. Apparently this is planned for June 5 on the left. We have been using silver alginate compressing him bilaterally. He ordered stockings from elastic therapy for some reason they have not come in yet Objective Constitutional Sitting or standing Blood Pressure is within target range for patient.. Pulse regular and within target range for patient.Marland Kitchen Respirations regular, non-labored and within target range.. Temperature is normal and within the target range for the patient.Marland Kitchen Appears in no distress. Vitals Time Taken: 1:27 PM, Height: 63 in, Weight: 162 lbs, BMI: 28.7, Temperature: 98.5 F, Pulse: 97 bpm, Respiratory Rate: 18 breaths/min, Blood Pressure: 109/62 mmHg. General Notes: Wound exam; patient continues to have skin changes  of chronic venous insufficiency his edema is controlled he has a single wound on the left foot and an area on the right third toe. Integumentary (Hair, Skin) Wound #11 status is Open. Original cause of wound was Gradually Appeared. The wound is located on the Right Toe Third. The wound measures 0.3cm length x 0.4cm width x 0.1cm depth; 0.094cm^2 area and 0.009cm^3 volume. There is Fat Layer (Subcutaneous Tissue) Exposed exposed. There is no tunneling or undermining noted. There is a small amount of serosanguineous drainage noted. The wound margin is distinct with the outline attached to the wound base. There is large (67-100%) red granulation within the wound bed. There is no necrotic tissue within the wound bed. Wound #5 status is Open. Original cause of wound was Gradually Appeared. The wound is located on the Left,Dorsal Foot. The wound measures 2.4cm length x 1cm width x 0.3cm depth; 1.885cm^2 area and 0.565cm^3 volume. There is Fat Layer (Subcutaneous Tissue) Exposed exposed. There is no tunneling or undermining noted. There is a medium amount of serosanguineous drainage noted.  The wound margin is distinct with the outline attached to the wound base. There is large (67-100%) red, pink granulation within the wound bed. There is a small (1-33%) amount of necrotic tissue within the wound bed including Adherent Slough. Assessment Active Problems ICD-10 Non-pressure chronic ulcer of other part of left foot limited to breakdown of skin Chronic venous hypertension (idiopathic) with ulcer and inflammation of right lower extremity Other rheumatoid arthritis with rheumatoid factor of unspecified site Non-pressure chronic ulcer of right calf limited to breakdown of skin Procedures Wound #5 Pre-procedure diagnosis of Wound #5 is an Inflammatory located on the Left,Dorsal Foot . There was a Three Layer Compression Therapy Procedure by Baruch Gouty, RN. Post procedure Diagnosis Wound #5: Same as Pre-Procedure There was a Three Layer Compression Therapy Procedure by Baruch Gouty, RN. Post procedure Diagnosis Wound #: Same as Pre-Procedure Plan Follow-up Appointments: Return Appointment in 1 week. - Thursday Dressing Change Frequency: Change dressing three times week. - twice a week by home health. Skin Barriers/Peri-Wound Care: TCA Cream or Ointment - liberally to both legs, feet, toes, and all wounds in clinic and at home by home health. Wound Cleansing: May shower and wash wound with soap and water. - with dressing changes. Primary Wound Dressing: Wound #11 Right Toe Third: Calcium Alginate with Silver Wound #5 Left,Dorsal Foot: Calcium Alginate with Silver Secondary Dressing: Wound #11 Right Toe Third: Kerlix/Rolled Gauze Dry Gauze Wound #5 Left,Dorsal Foot: Dry Gauze Edema Control: 3 Layer Compression System - Bilateral Avoid standing for long periods of time Elevate legs to the level of the heart or above for 30 minutes daily and/or when sitting, a frequency of: - throughout the day. Support Garment 20-30 mm/Hg pressure to: - ***Bring in stockings on  your next wound care visit.*** Home Health: [place] [facility] for wound care. - [facility]. 1. Left dorsal foot and right third toe alginate 2. Continue to compress both legs bilaterally until we have stockings. I told him I need a stocking for the legs by next week especially on the right which does not have an open wound Electronic Signature(s) Signed: 08/01/2019 5:58:56 PM By: Linton Ham MD Entered By: Linton Ham on 08/01/2019 14:20:23 -------------------------------------------------------------------------------- SuperBill Details Patient Name: Date of Service: JERIKO, SOLTERO 08/01/2019 Medical Record [id number] Patient Account Number: [id number] Date of Birth/Sex: Treating RN: [birth year] (59 y.o. 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 Physician Procedures CPT4 Code Description: [id number] - WC PHYS LEVEL 3 - EST PT ICD-10 Diagnosis Description L97.521 Non-pressure chronic ulcer of other part of left foot limit I87.331 Chronic venous hypertension (idiopathic) with ulcer and inf lower extremity Modifier: ed to breakdown lammation of  rig Quantity: 1 of skin ht Electronic Signature(s) Signed: 08/01/2019 5:58:56 PM By: Linton Ham MD Entered By: Linton Ham on 08/01/2019 14:20:48

## 2019-08-06 ENCOUNTER — Other Ambulatory Visit: Payer: Self-pay | Admitting: *Deleted

## 2019-08-06 DIAGNOSIS — I83893 Varicose veins of bilateral lower extremities with other complications: Secondary | ICD-10-CM

## 2019-08-07 NOTE — Progress Notes (Signed)
SOHRAB, VANNELLI ([id number]) Visit Report for 08/01/2019 Arrival Information Details Patient Name: Date of Service: RUDY, AMINOV 08/01/2019 1:00 PM Medical Record [id number] Patient Account Number: [id number] Date of Birth/Sex: Treating RN: [birth year] (59 y.o. [name] Primary Care Delwyn Scoggin: [employer] Other Clinician: Referring Sinai Mahany: Treating Shian Goodnow/Extender:Robson, Gustavo Lah, Cynda Familia in Treatment: 29 Visit Information History Since Last Visit Added or deleted any medications: No Patient Arrived: Crutches Any new allergies or adverse reactions: No Arrival Time: 13:24 Had a fall or experienced change in No Accompanied By: self activities of daily living that may affect Transfer Assistance: None risk of falls: Patient Identification Verified: Yes Signs or symptoms of abuse/neglect since last No Secondary Verification Process Yes visito Completed: Hospitalized since last visit: No Patient Requires Transmission- No Implantable device outside of the clinic excluding No Based Precautions: cellular tissue based products placed in the center Patient Has Alerts: Yes since last visit: Patient Alerts: Patient on Blood Has Dressing in Place as Prescribed: Yes Thinner Pain Present Now: No ABI Right .98 ABI Left 1.01 Electronic Signature(s) Signed: 08/07/2019 9:19:04 AM By: Sandre Kitty Entered By: Sandre Kitty on 08/01/2019 13:27:54 -------------------------------------------------------------------------------- Compression Therapy Details Patient Name: Date of Service: DEANO, PARELLO 08/01/2019 1:00 PM Medical Record [id number] Patient Account Number: [id number] Date of Birth/Sex: Treating RN: 03-10-1961 (59 y.o. [name] Primary Care Lauri Purdum: [employer] Other Clinician: Referring Airyn Ellzey: Treating Colbe Viviano/Extender:Robson, Gustavo Lah, Cynda Familia in Treatment: 29 Compression Therapy Performed for Wound Wound #5  Left,Dorsal Foot Assessment: Performed By: Clinician Baruch Gouty, RN Compression Type: Three Layer Post Procedure Diagnosis Same as Pre-procedure Electronic Signature(s) Signed: 08/01/2019 6:08:37 PM By: Deon Pilling Entered By: Deon Pilling on 08/01/2019 14:14:02 -------------------------------------------------------------------------------- Compression Therapy Details Patient Name: Date of Service: AMBROSE, DONA 08/01/2019 1:00 PM Medical Record [id number] Patient Account Number: [id number] Date of Birth/Sex: Treating RN: [birth year] (59 y.o. [name] Primary Care Jolyne Laye: [employer] Other Clinician: Referring Kellin Bartling: Treating Wiley Magan/Extender:Robson, Gustavo Lah, Cynda Familia in Treatment: 29 Compression Therapy Performed for Wound NonWound Condition Lymphedema - Right Leg Assessment: Performed By: Clinician Baruch Gouty, RN Compression Type: Three Layer Post Procedure Diagnosis Same as Pre-procedure Electronic Signature(s) Signed: 08/01/2019 6:08:37 PM By: Deon Pilling Entered By: Deon Pilling on 08/01/2019 14:14:14 -------------------------------------------------------------------------------- Encounter Discharge Information Details Patient Name: Date of Service: DERMOT, REVERS 08/01/2019 1:00 PM Medical Record [id number] Patient Account Number: [id number] Date of Birth/Sex: Treating RN: [birth year] (59 y.o. [name] Primary Care Kauri Garson: [employer] Other Clinician: Referring Tykia Mellone: Treating Tamicka Shimon/Extender:Robson, Gustavo Lah, Cynda Familia in Treatment: 29 Encounter Discharge Information Items Discharge Condition: Stable Ambulatory Status: Crutches Discharge Destination: Home Transportation: Private Auto Accompanied By: self Schedule Follow-up Appointment: Yes Clinical Summary of Care: Patient Declined Electronic Signature(s) Signed: 08/02/2019 6:10:03 PM By: Baruch Gouty RN, BSN Entered By: Baruch Gouty on 08/01/2019 14:37:46 -------------------------------------------------------------------------------- Lower Extremity Assessment Details Patient Name: Date of Service: CANUTO, PATYK 08/01/2019 1:00 PM Medical Record [id number] Patient Account Number: [id number] Date of Birth/Sex: Treating RN: July 26, 1960 (59 y.o. [name] Primary Care Lacye Mccarn: [employer] Other Clinician: Referring Joyia Riehle: Treating Lokelani Lutes/Extender:Robson, Gustavo Lah, Cynda Familia in Treatment: 29 Edema Assessment Assessed: [Left: No] [Right: No] Edema: [Left: Yes] [Right: Yes] Calf Left: Right: Point of Measurement: 36 cm From Medial Instep 28 cm 28 cm Ankle Left: Right: Point of Measurement: 8 cm From Medial Instep 21.2 cm 21.4 cm Vascular Assessment Pulses: Dorsalis Pedis Palpable: [Left:Yes] [Right:Yes] Electronic Signature(s) Signed: 08/05/2019 6:12:10 PM By: [name] RN, BSN Entered By: [name] on 08/01/2019  13:50:48 -------------------------------------------------------------------------------- Multi Wound Chart Details Patient Name: Date of Service: RAUNEL, UEHARA 08/01/2019 1:00 PM Medical Record [id number] Patient Account Number: [id number] Date of Birth/Sex: Treating RN: 06/20/1960 (59 y.o. [name] Primary Care Damario Gillie: [employer] Other Clinician: Referring Bradie Lacock: Treating Shari Natt/Extender:Robson, Gustavo Lah, Cynda Familia in Treatment: 29 Vital Signs Height(in): 63 Pulse(bpm): 97 Weight(lbs): 162 Blood Pressure(mmHg): 109/62 Body Mass Index(BMI): 29 Temperature(F): 98.5 Respiratory 18 Rate(breaths/min): Photos: [11:No Photos] [5:No Photos] [N/A:N/A] Wound Location: [11:Right Toe Third] [5:Left, Dorsal Foot] [N/A:N/A] Wounding Event: [11:Gradually Appeared] [5:Gradually Appeared] [N/A:N/A] Primary Etiology: [11:Inflammatory] [5:Inflammatory] [N/A:N/A] Comorbid History: [11:Rheumatoid Arthritis, Osteoarthritis]  [5:Rheumatoid Arthritis, Osteoarthritis] [N/A:N/A] Date Acquired: [11:05/01/2019] [5:11/24/2018] [N/A:N/A] Weeks of Treatment: [11:13] [5:29] [N/A:N/A] Wound Status: [11:Open] [5:Open] [N/A:N/A] Measurements L x W x D 0.3x0.4x0.1 [5:2.4x1x0.3] [N/A:N/A] (cm) Area (cm) : [11:0.094] [5:1.885] [N/A:N/A] Volume (cm) : [11:0.009] [5:0.565] [N/A:N/A] % Reduction in Area: [11:88.00%] [5:80.30%] [N/A:N/A] % Reduction in Volume: 88.60% [5:40.80%] [N/A:N/A] Classification: [11:Full Thickness With Exposed Support Structures Exposed Support Structures] [5:Full Thickness Without] [N/A:N/A] Exudate Amount: [11:Small] [5:Medium] [N/A:N/A] Exudate Type: [11:Serosanguineous] [5:Serosanguineous] [N/A:N/A] Exudate Color: [11:red, brown] [5:red, brown] [N/A:N/A] Wound Margin: [11:Distinct, outline attached Distinct, outline attached N/A] Granulation Amount: [11:Large (67-100%)] [5:Large (67-100%)] [N/A:N/A] Granulation Quality: [11:Red] [5:Red, Pink] [N/A:N/A] Necrotic Amount: [11:None Present (0%)] [5:Small (1-33%)] [N/A:N/A] Exposed Structures: [11:Fat Layer (Subcutaneous Fat Layer (Subcutaneous N/A Tissue) Exposed: Yes Fascia: No Tendon: No Muscle: No Joint: No Bone: No] [5:Tissue) Exposed: Yes Fascia: No Tendon: No Muscle: No Joint: No Bone: No] Epithelialization: [11:Large (67-100%)] [5:Small (1-33%) Compression Therapy] [N/A:N/A N/A] Treatment Notes Electronic Signature(s) Signed: 08/01/2019 5:58:56 PM By: Linton Ham MD Signed: 08/01/2019 6:08:37 PM By: Deon Pilling Entered By: Linton Ham on 08/01/2019 14:15:42 -------------------------------------------------------------------------------- Multi-Disciplinary Care Plan Details Patient Name: Date of Service: KAEDIN, HORKEY 08/01/2019 1:00 PM Medical Record [id number] Patient Account Number: [id number] Date of Birth/Sex: Treating RN: 03/05/1961 (59 y.o. [name] Primary Care Teagan Ozawa: [employer] Other Clinician: Referring  Jiyaan Steinhauser: Treating Maziah Keeling/Extender:Robson, Gustavo Lah, Cynda Familia in Treatment: 29 Active Inactive Nutrition Nursing Diagnoses: Potential for alteratiion in Nutrition/Potential for imbalanced nutrition Goals: Patient/caregiver agrees to and verbalizes understanding of need to obtain nutritional consultation Date Initiated: 01/10/2019 Target Resolution Date: 08/23/2019 Goal Status: Active Interventions: Provide education on nutrition Treatment Activities: Education provided on Nutrition : 06/27/2019 Patient referred to Primary Care Physician for further nutritional evaluation : 01/10/2019 Notes: Electronic Signature(s) Signed: 08/01/2019 6:08:37 PM By: Deon Pilling Entered By: Deon Pilling on 08/01/2019 14:19:17 -------------------------------------------------------------------------------- Pain Assessment Details Patient Name: Date of Service: FROILAN, GREENLAW 08/01/2019 1:00 PM Medical Record [id number] Patient Account Number: [id number] Date of Birth/Sex: Treating RN: 1960/05/02 (59 y.o. [name] Primary Care Sophronia Varney: [employer] [facility] Other Clinician: Referring Nickie Deren: Treating Jani Moronta/Extender:Robson, Gustavo Lah, Cynda Familia in Treatment: 29 Active Problems Location of Pain Severity and Description of Pain Patient Has Paino Yes Site Locations Rate the pain. Current Pain Level: 7 Pain Management and Medication Current Pain Management: Electronic Signature(s) Signed: 08/01/2019 6:08:37 PM By: Deon Pilling Signed: 08/07/2019 9:19:04 AM By: Sandre Kitty Entered By: Sandre Kitty on 08/01/2019 13:28:22 -------------------------------------------------------------------------------- Patient/Caregiver Education Details Patient Name: Date of Service: JOSE, PACEY 4/8/2021andnbsp1:00 PM Medical Record [id number] Patient Account Number: [id number] Date of Birth/Gender: Treating RN: 1960-12-07 (59 y.o. [name] Primary Care  Physician: [facility] Other Clinician: Referring Physician: Treating Physician/Extender:Robson, Gustavo Lah, Cynda Familia in Treatment: 29 Education Assessment Education Provided To: Patient Education Topics Provided Venous: Handouts: Controlling Swelling with Compression Stockings Methods: Explain/Verbal Responses: Reinforcements needed Electronic Signature(s) Signed: 08/01/2019  6:08:37 PM By: Deon Pilling Entered By: Deon Pilling on 08/01/2019 14:20:39 -------------------------------------------------------------------------------- Wound Assessment Details Patient Name: Date of Service: TAVEON, STEINWAND 08/01/2019 1:00 PM Medical Record [id number] Patient Account Number: [id number] Date of Birth/Sex: Treating RN: June 27, 1960 (59 y.o. [name] Primary Care Azahel Belcastro: [employer] Other Clinician: Referring Kala Gassmann: Treating Tandre Conly/Extender:Robson, Gustavo Lah, LORI Weeks in Treatment: 29 Wound Status Wound Number: 11 Primary Etiology: Inflammatory Wound Location: Right Toe Third Wound Status: Open Wounding Event: Gradually Appeared Comorbid History: Rheumatoid Arthritis, Osteoarthritis Date Acquired: 05/01/2019 Weeks Of Treatment: 13 Clustered Wound: No Photos Photo Uploaded By: Mikeal Hawthorne on 08/02/2019 14:34:31 Wound Measurements Length: (cm) 0.3 % Reduction Width: (cm) 0.4 % Reduction Depth: (cm) 0.1 Epitheliali Area: (cm) 0.094 Tunneling: Volume: (cm) 0.009 Underminin Wound Description Classification: Full Thickness With Exposed Support Foul Odor A Structures Slough/Fibr Wound Distinct, outline attached Margin: Exudate Small Amount: Exudate Type: Serosanguineous Exudate Color: red, brown Wound Bed Granulation Amount: Large (67-100%) Granulation Quality: Red Fascia Expo Necrotic Amount: None Present (0%) Fat Layer ( Tendon Expo Muscle Expo Joint Expos Bone Expose fter Cleansing: No ino No Exposed Structure sed:  No Subcutaneous Tissue) Exposed: Yes sed: No sed: No ed: No d: No in Area: 88% in Volume: 88.6% zation: Large (67-100%) No g: No Treatment Notes Wound #11 (Right Toe Third) 3. Primary Dressing Applied Calcium Alginate Ag 4. Secondary Dressing Roll Gauze Electronic Signature(s) Signed: 08/01/2019 6:08:37 PM By: Deon Pilling Signed: 08/05/2019 6:12:10 PM By: [name] RN, BSN Entered By: [name] on 08/01/2019 13:51:01 -------------------------------------------------------------------------------- Wound Assessment Details Patient Name: Date of Service: JARQUIS, REICHL 08/01/2019 1:00 PM Medical Record [id number] Patient Account Number: [id number] Date of Birth/Sex: Treating RN: April 22, 1961 (59 y.o. [name] Primary Care Ayyan Sites: [employer] Other Clinician: Referring Tierra Divelbiss: Treating Bayard More/Extender:Robson, Gustavo Lah, Cynda Familia in Treatment: 29 Wound Status Wound Number: 5 Primary Etiology: Inflammatory Wound Location: Left, Dorsal Foot Wound Status: Open Wounding Event: Gradually Appeared Comorbid History: Rheumatoid Arthritis, Osteoarthritis Date Acquired: 11/24/2018 Weeks Of Treatment: 29 Clustered Wound: No Photos Photo Uploaded By: Mikeal Hawthorne on 08/02/2019 14:34:32 Wound Measurements Length: (cm) 2.4 % Reduct Width: (cm) 1 % Reduct Depth: (cm) 0.3 Epitheli Area: (cm) 1.885 Tunneli Volume: (cm) 0.565 Undermi Wound Description Full Thickness Without Exposed Support Foul Odo Classification: Structures Slough/F Wound Distinct, outline attached Margin: Exudate Exudate Medium Amount: Exudate Serosanguineous Type: Exudate red, brown Color: Wound Bed Granulation Amount: Large (67-100%) Granulation Quality: Red, Pink Fascia Expo Necrotic Amount: Small (1-33%) Fat Layer ( Necrotic Quality: Adherent Slough Tendon Expo Muscle Expo Joint Expos Bone Expose r After Cleansing: No ibrino Yes Exposed Structure sed:  No Subcutaneous Tissue) Exposed: Yes sed: No sed: No ed: No d: No ion in Area: 80.3% ion in Volume: 40.8% alization: Small (1-33%) ng: No ning: No Treatment Notes Wound #5 (Left, Dorsal Foot) 2. Periwound Care Moisturizing lotion TCA Cream 3. Primary Dressing Applied Calcium Alginate Ag 4. Secondary Dressing Dry Gauze 6. Support Layer Applied 3 layer compression wrap Notes bil 3 layer wraps Electronic Signature(s) Signed: 08/01/2019 6:08:37 PM By: Deon Pilling Signed: 08/05/2019 6:12:10 PM By: [name] RN, BSN Entered By: [name] on 08/01/2019 13:51:19 -------------------------------------------------------------------------------- Flippin Details Patient Name: Date of Service: VRISHANK, NEPOMUCENO 08/01/2019 1:00 PM Medical Record [id number] Patient Account Number: [id number] Date of Birth/Sex: Treating RN: Aug 16, 1960 (59 y.o. [name] Primary Care Susa Bones: [facility] Other Clinician: Referring Reganne Messerschmidt: Treating Nautika Cressey/Extender:Robson, Gustavo Lah, Cynda Familia in Treatment: 29 Vital Signs Time Taken: 13:27 Temperature (F): 98.5 Height (  in): 63 Pulse (bpm): 97 Weight (lbs): 162 Respiratory Rate (breaths/min): 18 Body Mass Index (BMI): 28.7 Blood Pressure (mmHg): 109/62 Reference Range: 80 - 120 mg / dl Electronic Signature(s) Signed: 08/07/2019 9:19:04 AM By: Sandre Kitty Entered By: Sandre Kitty on 08/01/2019 13:28:13

## 2019-08-08 ENCOUNTER — Encounter (HOSPITAL_BASED_OUTPATIENT_CLINIC_OR_DEPARTMENT_OTHER): Payer: Medicare Other | Admitting: Internal Medicine

## 2019-08-14 DIAGNOSIS — M47816 Spondylosis without myelopathy or radiculopathy, lumbar region: Secondary | ICD-10-CM | POA: Insufficient documentation

## 2019-08-16 ENCOUNTER — Other Ambulatory Visit: Payer: Self-pay | Admitting: Neurosurgery

## 2019-08-16 ENCOUNTER — Other Ambulatory Visit (HOSPITAL_COMMUNITY): Payer: Self-pay | Admitting: Neurosurgery

## 2019-08-16 DIAGNOSIS — M47816 Spondylosis without myelopathy or radiculopathy, lumbar region: Secondary | ICD-10-CM

## 2019-08-21 ENCOUNTER — Emergency Department (HOSPITAL_COMMUNITY): Payer: Medicare Other

## 2019-08-21 ENCOUNTER — Inpatient Hospital Stay (HOSPITAL_COMMUNITY)
Admission: EM | Admit: 2019-08-21 | Discharge: 2019-08-24 | DRG: 280 | Disposition: A | Discharge status: Home Health | Payer: Medicare Other | Attending: Internal Medicine | Admitting: Internal Medicine

## 2019-08-21 ENCOUNTER — Encounter (HOSPITAL_COMMUNITY)
Admission: EM | Disposition: A | Discharge status: Home Health | Payer: Self-pay | Source: Home / Self Care | Attending: Internal Medicine

## 2019-08-21 ENCOUNTER — Encounter (HOSPITAL_COMMUNITY): Payer: Self-pay | Admitting: *Deleted

## 2019-08-21 ENCOUNTER — Observation Stay (HOSPITAL_COMMUNITY): Payer: Medicare Other

## 2019-08-21 DIAGNOSIS — I872 Venous insufficiency (chronic) (peripheral): Secondary | ICD-10-CM | POA: Diagnosis present

## 2019-08-21 DIAGNOSIS — I110 Hypertensive heart disease with heart failure: Secondary | ICD-10-CM | POA: Diagnosis present

## 2019-08-21 DIAGNOSIS — I255 Ischemic cardiomyopathy: Secondary | ICD-10-CM | POA: Diagnosis present

## 2019-08-21 DIAGNOSIS — I25110 Atherosclerotic heart disease of native coronary artery with unstable angina pectoris: Secondary | ICD-10-CM | POA: Diagnosis not present

## 2019-08-21 DIAGNOSIS — I214 Non-ST elevation (NSTEMI) myocardial infarction: Secondary | ICD-10-CM | POA: Diagnosis present

## 2019-08-21 DIAGNOSIS — R7401 Elevation of levels of liver transaminase levels: Secondary | ICD-10-CM | POA: Diagnosis present

## 2019-08-21 DIAGNOSIS — I878 Other specified disorders of veins: Secondary | ICD-10-CM | POA: Diagnosis not present

## 2019-08-21 DIAGNOSIS — M069 Rheumatoid arthritis, unspecified: Secondary | ICD-10-CM | POA: Diagnosis not present

## 2019-08-21 DIAGNOSIS — Z87891 Personal history of nicotine dependence: Secondary | ICD-10-CM

## 2019-08-21 DIAGNOSIS — M1990 Unspecified osteoarthritis, unspecified site: Secondary | ICD-10-CM | POA: Diagnosis present

## 2019-08-21 DIAGNOSIS — I10 Essential (primary) hypertension: Secondary | ICD-10-CM | POA: Diagnosis present

## 2019-08-21 DIAGNOSIS — Z791 Long term (current) use of non-steroidal anti-inflammatories (NSAID): Secondary | ICD-10-CM | POA: Diagnosis not present

## 2019-08-21 DIAGNOSIS — E785 Hyperlipidemia, unspecified: Secondary | ICD-10-CM | POA: Diagnosis not present

## 2019-08-21 DIAGNOSIS — Z20822 Contact with and (suspected) exposure to covid-19: Secondary | ICD-10-CM | POA: Diagnosis not present

## 2019-08-21 DIAGNOSIS — I5023 Acute on chronic systolic (congestive) heart failure: Secondary | ICD-10-CM | POA: Diagnosis present

## 2019-08-21 DIAGNOSIS — D638 Anemia in other chronic diseases classified elsewhere: Secondary | ICD-10-CM | POA: Diagnosis present

## 2019-08-21 DIAGNOSIS — Z96653 Presence of artificial knee joint, bilateral: Secondary | ICD-10-CM | POA: Diagnosis present

## 2019-08-21 DIAGNOSIS — Z8249 Family history of ischemic heart disease and other diseases of the circulatory system: Secondary | ICD-10-CM

## 2019-08-21 DIAGNOSIS — Z96643 Presence of artificial hip joint, bilateral: Secondary | ICD-10-CM | POA: Diagnosis present

## 2019-08-21 DIAGNOSIS — D649 Anemia, unspecified: Secondary | ICD-10-CM | POA: Diagnosis present

## 2019-08-21 DIAGNOSIS — Z79899 Other long term (current) drug therapy: Secondary | ICD-10-CM | POA: Diagnosis not present

## 2019-08-21 DIAGNOSIS — E876 Hypokalemia: Secondary | ICD-10-CM | POA: Diagnosis not present

## 2019-08-21 DIAGNOSIS — Z7901 Long term (current) use of anticoagulants: Secondary | ICD-10-CM | POA: Diagnosis not present

## 2019-08-21 DIAGNOSIS — I5021 Acute systolic (congestive) heart failure: Secondary | ICD-10-CM | POA: Diagnosis not present

## 2019-08-21 DIAGNOSIS — L97909 Non-pressure chronic ulcer of unspecified part of unspecified lower leg with unspecified severity: Secondary | ICD-10-CM | POA: Diagnosis present

## 2019-08-21 DIAGNOSIS — Z7952 Long term (current) use of systemic steroids: Secondary | ICD-10-CM

## 2019-08-21 DIAGNOSIS — R109 Unspecified abdominal pain: Secondary | ICD-10-CM

## 2019-08-21 DIAGNOSIS — I2582 Chronic total occlusion of coronary artery: Secondary | ICD-10-CM | POA: Diagnosis present

## 2019-08-21 DIAGNOSIS — I2510 Atherosclerotic heart disease of native coronary artery without angina pectoris: Secondary | ICD-10-CM | POA: Diagnosis not present

## 2019-08-21 DIAGNOSIS — I83009 Varicose veins of unspecified lower extremity with ulcer of unspecified site: Secondary | ICD-10-CM | POA: Diagnosis present

## 2019-08-21 DIAGNOSIS — M47812 Spondylosis without myelopathy or radiculopathy, cervical region: Secondary | ICD-10-CM | POA: Insufficient documentation

## 2019-08-21 DIAGNOSIS — Z9181 History of falling: Secondary | ICD-10-CM | POA: Diagnosis not present

## 2019-08-21 DIAGNOSIS — G894 Chronic pain syndrome: Secondary | ICD-10-CM | POA: Diagnosis not present

## 2019-08-21 HISTORY — PX: LEFT HEART CATH AND CORONARY ANGIOGRAPHY: CATH118249

## 2019-08-21 LAB — APTT: aPTT: 36 seconds (ref 24–36)

## 2019-08-21 LAB — PROTIME-INR
INR: 1.1 (ref 0.8–1.2)
Prothrombin Time: 13.7 seconds (ref 11.4–15.2)

## 2019-08-21 LAB — CREATININE, SERUM
Creatinine, Ser: 0.61 mg/dL (ref 0.61–1.24)
GFR calc Af Amer: >60 mL/min (ref >60)
GFR calc non Af Amer: >60 mL/min (ref >60)

## 2019-08-21 LAB — COMPREHENSIVE METABOLIC PANEL
ALT: 25 U/L (ref 0–44)
AST: 61 U/L — ABNORMAL HIGH (ref 15–41)
Albumin: 3.7 g/dL (ref 3.5–5.0)
Alkaline Phosphatase: 104 U/L (ref 38–126)
Anion gap: 13 (ref 5–15)
BUN: 11 mg/dL (ref 6–20)
CO2: 23 mmol/L (ref 22–32)
Calcium: 10.3 mg/dL (ref 8.9–10.3)
Chloride: 100 mmol/L (ref 98–111)
Creatinine, Ser: 0.68 mg/dL (ref 0.61–1.24)
GFR calc Af Amer: >60 mL/min (ref >60)
GFR calc non Af Amer: >60 mL/min (ref >60)
Glucose, Bld: 130 mg/dL — ABNORMAL HIGH (ref 70–99)
Potassium: 3.9 mmol/L (ref 3.5–5.1)
Sodium: 136 mmol/L (ref 135–145)
Total Bilirubin: 0.7 mg/dL (ref 0.3–1.2)
Total Protein: 8.7 g/dL — ABNORMAL HIGH (ref 6.5–8.1)

## 2019-08-21 LAB — CBC
HCT: 33.7 % — ABNORMAL LOW (ref 39.0–52.0)
HCT: 29.1 % — ABNORMAL LOW (ref 39.0–52.0)
Hemoglobin: 10.3 g/dL — ABNORMAL LOW (ref 13.0–17.0)
Hemoglobin: 9.1 g/dL — ABNORMAL LOW (ref 13.0–17.0)
MCH: 25 pg — ABNORMAL LOW (ref 26.0–34.0)
MCH: 25 pg — ABNORMAL LOW (ref 26.0–34.0)
MCHC: 30.6 g/dL (ref 30.0–36.0)
MCHC: 31.3 g/dL (ref 30.0–36.0)
MCV: 81.8 fL (ref 80.0–100.0)
MCV: 79.9 fL — ABNORMAL LOW (ref 80.0–100.0)
Platelets: 329 10*3/uL (ref 150–400)
Platelets: 267 10*3/uL (ref 150–400)
RBC: 4.12 MIL/uL — ABNORMAL LOW (ref 4.22–5.81)
RBC: 3.64 MIL/uL — ABNORMAL LOW (ref 4.22–5.81)
RDW: 13.9 % (ref 11.5–15.5)
RDW: 13.7 % (ref 11.5–15.5)
WBC: 5.9 10*3/uL (ref 4.0–10.5)
WBC: 5 10*3/uL (ref 4.0–10.5)
nRBC: 0 % (ref 0.0–0.2)
nRBC: 0 % (ref 0.0–0.2)

## 2019-08-21 LAB — RESPIRATORY PANEL BY RT PCR (FLU A&B, COVID)
Influenza A by PCR: NEGATIVE
Influenza B by PCR: NEGATIVE
SARS Coronavirus 2 by RT PCR: NEGATIVE

## 2019-08-21 LAB — LIPID PANEL
Cholesterol: 175 mg/dL (ref 0–200)
HDL: 47 mg/dL (ref >40)
LDL Cholesterol: 113 mg/dL — ABNORMAL HIGH (ref 0–99)
Total CHOL/HDL Ratio: 3.7 RATIO
Triglycerides: 75 mg/dL (ref <150)
VLDL: 15 mg/dL (ref 0–40)

## 2019-08-21 LAB — TROPONIN I (HIGH SENSITIVITY)
Troponin I (High Sensitivity): 1717 ng/L (ref <18)
Troponin I (High Sensitivity): 3343 ng/L (ref <18)

## 2019-08-21 LAB — HEMOGLOBIN A1C
Hgb A1c MFr Bld: 5.7 % — ABNORMAL HIGH (ref 4.8–5.6)
Mean Plasma Glucose: 116.89 mg/dL

## 2019-08-21 LAB — LIPASE, BLOOD: Lipase: 37 U/L (ref 11–51)

## 2019-08-21 LAB — BRAIN NATRIURETIC PEPTIDE: B Natriuretic Peptide: 87.3 pg/mL (ref 0.0–100.0)

## 2019-08-21 LAB — TSH: TSH: 1.646 u[IU]/mL (ref 0.350–4.500)

## 2019-08-21 LAB — D-DIMER, QUANTITATIVE: D-Dimer, Quant: 9.1 ug/mL-FEU — ABNORMAL HIGH (ref 0.00–0.50)

## 2019-08-21 SURGERY — LEFT HEART CATH AND CORONARY ANGIOGRAPHY
Anesthesia: LOCAL

## 2019-08-21 MED ORDER — TRAMADOL HCL 50 MG PO TABS: 50.0000 mg | ORAL_TABLET | Freq: Three times a day (TID) | ORAL | Status: DC | PRN

## 2019-08-21 MED ORDER — ASPIRIN 81 MG PO CHEW
324.0000 mg | CHEWABLE_TABLET | Freq: Once | ORAL | Status: AC
  Administered 2019-08-21: 08:00:00 324 mg via ORAL
  Filled 2019-08-21: qty 4

## 2019-08-21 MED ORDER — ONDANSETRON HCL 4 MG/2ML IJ SOLN: 4.0000 mg | Freq: Four times a day (QID) | INTRAMUSCULAR | Status: DC | PRN

## 2019-08-21 MED ORDER — DOCUSATE SODIUM 100 MG PO CAPS
100.0000 mg | ORAL_CAPSULE | Freq: Two times a day (BID) | ORAL | Status: DC
  Administered 2019-08-21 – 2019-08-24 (×6): 100 mg via ORAL
  Filled 2019-08-21 (×6): qty 1

## 2019-08-21 MED ORDER — HEPARIN SODIUM (PORCINE) 1000 UNIT/ML IJ SOLN
INTRAMUSCULAR | Status: AC
  Filled 2019-08-21: qty 1

## 2019-08-21 MED ORDER — SODIUM CHLORIDE 0.9 % IV SOLN: INTRAVENOUS | Status: DC

## 2019-08-21 MED ORDER — HEPARIN (PORCINE) 25000 UT/250ML-% IV SOLN
850.0000 [IU]/h | INTRAVENOUS | Status: DC
  Administered 2019-08-21: 09:00:00 850 [IU]/h via INTRAVENOUS
  Filled 2019-08-21: qty 250

## 2019-08-21 MED ORDER — VERAPAMIL HCL 2.5 MG/ML IV SOLN
INTRAVENOUS | Status: AC
  Filled 2019-08-21: qty 2

## 2019-08-21 MED ORDER — LIDOCAINE HCL (PF) 1 % IJ SOLN
INTRAMUSCULAR | Status: AC
  Filled 2019-08-21: qty 30

## 2019-08-21 MED ORDER — HEPARIN (PORCINE) IN NACL 1000-0.9 UT/500ML-% IV SOLN
INTRAVENOUS | Status: AC
  Filled 2019-08-21: qty 1000

## 2019-08-21 MED ORDER — LIDOCAINE HCL (PF) 1 % IJ SOLN
INTRAMUSCULAR | Status: DC | PRN
  Administered 2019-08-21: 12 mL

## 2019-08-21 MED ORDER — ACETAMINOPHEN 325 MG PO TABS
650.0000 mg | ORAL_TABLET | ORAL | Status: DC | PRN
  Administered 2019-08-22: 21:00:00 650 mg via ORAL
  Filled 2019-08-21: qty 2

## 2019-08-21 MED ORDER — HEPARIN SODIUM (PORCINE) 5000 UNIT/ML IJ SOLN
5000.0000 [IU] | Freq: Three times a day (TID) | INTRAMUSCULAR | Status: DC
  Administered 2019-08-21 – 2019-08-24 (×9): 5000 [IU] via SUBCUTANEOUS
  Filled 2019-08-21 (×9): qty 1

## 2019-08-21 MED ORDER — IOHEXOL 350 MG/ML SOLN
INTRAVENOUS | Status: DC | PRN
  Administered 2019-08-21: 60 mL

## 2019-08-21 MED ORDER — ONDANSETRON HCL 4 MG/2ML IJ SOLN
4.0000 mg | Freq: Four times a day (QID) | INTRAMUSCULAR | Status: DC | PRN
Start: 2019-08-21 — End: 2019-08-21

## 2019-08-21 MED ORDER — HYDRALAZINE HCL 20 MG/ML IJ SOLN: 10.0000 mg | INTRAMUSCULAR | Status: AC | PRN

## 2019-08-21 MED ORDER — SODIUM CHLORIDE 0.9 % IV SOLN: INTRAVENOUS | Status: AC

## 2019-08-21 MED ORDER — MIDAZOLAM HCL 2 MG/2ML IJ SOLN
INTRAMUSCULAR | Status: AC
  Filled 2019-08-21: qty 2

## 2019-08-21 MED ORDER — SODIUM CHLORIDE (PF) 0.9 % IJ SOLN
INTRAMUSCULAR | Status: AC
  Filled 2019-08-21: qty 50

## 2019-08-21 MED ORDER — SODIUM CHLORIDE 0.9% FLUSH: 3.0000 mL | Freq: Once | INTRAVENOUS | Status: DC

## 2019-08-21 MED ORDER — CYCLOBENZAPRINE HCL 5 MG PO TABS: 5.0000 mg | ORAL_TABLET | Freq: Three times a day (TID) | ORAL | Status: DC | PRN

## 2019-08-21 MED ORDER — FENTANYL CITRATE (PF) 100 MCG/2ML IJ SOLN
INTRAMUSCULAR | Status: AC
  Filled 2019-08-21: qty 2

## 2019-08-21 MED ORDER — LABETALOL HCL 5 MG/ML IV SOLN: 10.0000 mg | INTRAVENOUS | Status: AC | PRN

## 2019-08-21 MED ORDER — BISACODYL 10 MG RE SUPP: 10.0000 mg | Freq: Every day | RECTAL | Status: DC | PRN

## 2019-08-21 MED ORDER — IOHEXOL 350 MG/ML SOLN
100.0000 mL | Freq: Once | INTRAVENOUS | Status: AC | PRN
  Administered 2019-08-21: 11:00:00 100 mL via INTRAVENOUS

## 2019-08-21 MED ORDER — SODIUM CHLORIDE 0.9 % IV SOLN: 250.0000 mL | INTRAVENOUS | Status: DC | PRN

## 2019-08-21 MED ORDER — PANTOPRAZOLE SODIUM 40 MG PO TBEC
40.0000 mg | DELAYED_RELEASE_TABLET | Freq: Every day | ORAL | Status: DC
  Administered 2019-08-21 – 2019-08-24 (×4): 40 mg via ORAL
  Filled 2019-08-21 (×4): qty 1

## 2019-08-21 MED ORDER — SODIUM CHLORIDE 0.9% FLUSH: 3.0000 mL | INTRAVENOUS | Status: DC | PRN

## 2019-08-21 MED ORDER — FENTANYL CITRATE (PF) 100 MCG/2ML IJ SOLN
50.0000 ug | Freq: Once | INTRAMUSCULAR | Status: AC
  Administered 2019-08-21: 14:00:00 50 ug via INTRAVENOUS
  Filled 2019-08-21: qty 2

## 2019-08-21 MED ORDER — PREDNISONE 5 MG PO TABS
5.0000 mg | ORAL_TABLET | Freq: Two times a day (BID) | ORAL | Status: DC
  Administered 2019-08-21 – 2019-08-24 (×7): 5 mg via ORAL
  Filled 2019-08-21 (×7): qty 1

## 2019-08-21 MED ORDER — HYDROCODONE-ACETAMINOPHEN 5-325 MG PO TABS
1.0000 | ORAL_TABLET | Freq: Once | ORAL | Status: AC
  Administered 2019-08-21: 07:00:00 1 via ORAL
  Filled 2019-08-21: qty 1

## 2019-08-21 MED ORDER — ASPIRIN 81 MG PO CHEW: 81.0000 mg | CHEWABLE_TABLET | ORAL | Status: DC

## 2019-08-21 MED ORDER — ATORVASTATIN CALCIUM 40 MG PO TABS
40.0000 mg | ORAL_TABLET | Freq: Every day | ORAL | Status: DC
  Administered 2019-08-21: 19:00:00 40 mg via ORAL
  Filled 2019-08-21: qty 1

## 2019-08-21 MED ORDER — MIDAZOLAM HCL 2 MG/2ML IJ SOLN
INTRAMUSCULAR | Status: DC | PRN
  Administered 2019-08-21: 1 mg via INTRAVENOUS

## 2019-08-21 MED ORDER — HEPARIN (PORCINE) IN NACL 1000-0.9 UT/500ML-% IV SOLN
INTRAVENOUS | Status: DC | PRN
  Administered 2019-08-21 (×2): 500 mL

## 2019-08-21 MED ORDER — OXYCODONE HCL 5 MG PO TABS: 5.0000 mg | ORAL_TABLET | ORAL | Status: DC | PRN

## 2019-08-21 MED ORDER — LORATADINE 10 MG PO TABS
10.0000 mg | ORAL_TABLET | Freq: Every day | ORAL | Status: DC
  Administered 2019-08-21 – 2019-08-24 (×4): 10 mg via ORAL
  Filled 2019-08-21 (×4): qty 1

## 2019-08-21 MED ORDER — SODIUM CHLORIDE 0.9% FLUSH
3.0000 mL | Freq: Two times a day (BID) | INTRAVENOUS | Status: DC
  Administered 2019-08-22 – 2019-08-24 (×4): 3 mL via INTRAVENOUS

## 2019-08-21 MED ORDER — MONTELUKAST SODIUM 10 MG PO TABS
10.0000 mg | ORAL_TABLET | Freq: Every day | ORAL | Status: DC
  Administered 2019-08-21 – 2019-08-23 (×3): 10 mg via ORAL
  Filled 2019-08-21 (×3): qty 1

## 2019-08-21 MED ORDER — FLUTICASONE PROPIONATE 50 MCG/ACT NA SUSP
1.0000 | Freq: Every day | NASAL | Status: DC
  Filled 2019-08-21: qty 16

## 2019-08-21 MED ORDER — FENTANYL CITRATE (PF) 100 MCG/2ML IJ SOLN
INTRAMUSCULAR | Status: DC | PRN
  Administered 2019-08-21: 25 ug via INTRAVENOUS

## 2019-08-21 MED ORDER — NITROGLYCERIN 2 % TD OINT
0.5000 [in_us] | TOPICAL_OINTMENT | Freq: Four times a day (QID) | TRANSDERMAL | Status: DC
  Administered 2019-08-21 – 2019-08-22 (×4): 0.5 [in_us] via TOPICAL
  Filled 2019-08-21: qty 30

## 2019-08-21 MED ORDER — SODIUM CHLORIDE 0.9 % IV BOLUS
1000.0000 mL | Freq: Once | INTRAVENOUS | Status: AC
  Administered 2019-08-21: 08:00:00 1000 mL via INTRAVENOUS

## 2019-08-21 MED ORDER — MORPHINE SULFATE (PF) 2 MG/ML IV SOLN
2.0000 mg | INTRAVENOUS | Status: DC | PRN
  Administered 2019-08-21: 17:00:00 2 mg via INTRAVENOUS
  Filled 2019-08-21: qty 1

## 2019-08-21 MED ORDER — POLYETHYLENE GLYCOL 3350 17 G PO PACK: 17.0000 g | PACK | Freq: Every day | ORAL | Status: DC | PRN

## 2019-08-21 MED ORDER — HEPARIN BOLUS VIA INFUSION
4000.0000 [IU] | INTRAVENOUS | Status: AC
  Administered 2019-08-21: 09:00:00 4000 [IU] via INTRAVENOUS
  Filled 2019-08-21: qty 4000

## 2019-08-21 MED ORDER — ONDANSETRON HCL 4 MG PO TABS: 4.0000 mg | ORAL_TABLET | Freq: Four times a day (QID) | ORAL | Status: DC | PRN

## 2019-08-21 MED ORDER — SODIUM CHLORIDE 0.9 % IV SOLN
250.0000 mL | INTRAVENOUS | Status: DC | PRN
  Administered 2019-08-23 – 2019-08-24 (×2): 250 mL via INTRAVENOUS

## 2019-08-21 MED ORDER — ONDANSETRON HCL 4 MG PO TABS
4.0000 mg | ORAL_TABLET | Freq: Once | ORAL | Status: AC
  Administered 2019-08-21: 07:00:00 4 mg via ORAL
  Filled 2019-08-21: qty 1

## 2019-08-21 MED ORDER — SODIUM CHLORIDE 0.9% FLUSH
3.0000 mL | Freq: Two times a day (BID) | INTRAVENOUS | Status: DC
  Administered 2019-08-21 – 2019-08-24 (×4): 3 mL via INTRAVENOUS

## 2019-08-21 MED ORDER — GABAPENTIN 300 MG PO CAPS
300.0000 mg | ORAL_CAPSULE | Freq: Two times a day (BID) | ORAL | Status: DC
  Administered 2019-08-21 – 2019-08-24 (×6): 300 mg via ORAL
  Filled 2019-08-21 (×6): qty 1

## 2019-08-21 SURGICAL SUPPLY — 8 items
CATH INFINITI 5FR MULTPACK ANG (CATHETERS) ×1 IMPLANT
CLOSURE MYNX CONTROL 5F (Vascular Products) ×1 IMPLANT
KIT HEART LEFT (KITS) ×2 IMPLANT
PACK CARDIAC CATHETERIZATION (CUSTOM PROCEDURE TRAY) ×2 IMPLANT
SHEATH PINNACLE 5F 10CM (SHEATH) ×1 IMPLANT
TRANSDUCER W/STOPCOCK (MISCELLANEOUS) ×2 IMPLANT
TUBING CIL FLEX 10 FLL-RA (TUBING) ×2 IMPLANT
WIRE EMERALD 3MM-J .035X150CM (WIRE) ×1 IMPLANT

## 2019-08-21 NOTE — Progress Notes (Signed)
Received pt from [facility] via [facility].  Pt alert and oriented X4, skin warm and dry, resp even and unlabored.  Pt placed on monitor, IVF infusing, and consent signed.  Pt waiting on cath procedure.

## 2019-08-21 NOTE — ED Notes (Signed)
Pt is now nauseated.

## 2019-08-21 NOTE — ED Triage Notes (Signed)
Pt arrives from home via [facility] with c/o mid to lower back pain for 4 weeks, worse tonight. Hx of gallstones. 148/84, 74hr 16 r 100% 97.8 temp.

## 2019-08-21 NOTE — ED Notes (Signed)
Attempted to call report, RN will call back when available. [facility] called for transport.

## 2019-08-21 NOTE — ED Triage Notes (Signed)
Pt reporting midline  lower back pain for about a year, pain became worse last night, radiates down bilateral legs. Pt says he had surgery on his feet about a year ago and uses crutches to help him walk.

## 2019-08-21 NOTE — H&P (Addendum)
[facility] History and Physical  Kensei Desruisseaux [id number] DOB: Aug 19, 1960 DOA: 08/21/2019  Referring physician: ED  PCP: Sinclair Ship, MD   Patient is coming from: Home  Chief Complaint: Chest pain   HPI: Jonathan [name] is a 59 y.o. male with past medical history of rheumatoid arthritis, polyclonal gammopathy, vitamin B12 deficiency, hyperlipidemia, chronic venous insufficiency with bilateral lower extremity Unna boots and ulcers and chronic pain presented to hospital with generalized body pain chest pain and back pain getting worse for the last 1 week.  Patient denies any increasing dyspnea, diaphoresis palpitation.  No mention of fever, cough, chills or rigor.  No urinary urgency, frequency or dysuria.  No nausea, abdominal pain but had one episode of vomiting today.  Patient usually ambulates with the help of the crutches at home and lives with his cousin.  Patient complained of generalized tenderness over his joints.  He also complains of back pain with radiation to the legs.  He is on prednisone and Mobic as outpatient and is supposed to get infliximab every 6 weeks.  Patient complains of bilateral lower extremity swelling and is on [employer].   ED Course: In the ED, patient complains of diffuse pain.  He remained hemodynamically stable.  EKG had Q waves in inferior leads. Troponin was elevated significantly.  First set of troponin was 1717 followed by 3343.  D-dimer was also elevated at 9.1.  hemoglobin of 10.3.  Creatinine 0.6.  Platelets within normal limits.  CT angiogram of the chest was planned for elevated d dimer.  Covid test was negative.  Cardiology was consulted and patient was considered for admission to the hospital. Patient was given aspirin and heparin drip in the ED.  Review of Systems:  All systems were reviewed and were negative unless otherwise mentioned in the HPI  Past Medical History:  Diagnosis Date  . Anemia    H/o using iron in the past   .  Arthritis    rheumatoid  . Chronic back pain   . Hyperlipidemia   . Joint pain   . Joint swelling   . Rheumatoid arthritis(714.0)    Past Surgical History:  Procedure Laterality Date  . COLONOSCOPY N/A 04/03/2013   Procedure: COLONOSCOPY;  Surgeon: Irene Shipper, MD;  Location: [facility];  Service: Endoscopy;  Laterality: N/A;  . COLONOSCOPY    . ESOPHAGOGASTRODUODENOSCOPY N/A 04/03/2013   Procedure: ESOPHAGOGASTRODUODENOSCOPY (EGD);  Surgeon: Irene Shipper, MD;  Location: [facility];  Service: Endoscopy;  Laterality: N/A;  changed to moderate dr. Henrene Pastor did not want to go to the o.r. for an endo colon-jmt  . EYE SURGERY Left    "when i was a very small boy"  . JOINT REPLACEMENT     both knees  . KNEE ARTHROPLASTY  06/07/2011   Procedure: COMPUTER ASSISTED TOTAL KNEE ARTHROPLASTY;  Surgeon: Mcarthur Rossetti, MD;  Location: [place];  Service: Orthopedics;  Laterality: Right;  Right total knee arthoplasty  . KNEE CLOSED REDUCTION  07/19/2011   Procedure: CLOSED MANIPULATION KNEE;  Surgeon: Mcarthur Rossetti, MD;  Location: [place];  Service: Orthopedics;  Laterality: Right;  Manipulation under anesthesia right knee  . TOTAL HIP ARTHROPLASTY Right 11/11/2014   Procedure: RIGHT TOTAL HIP ARTHROPLASTY ANTERIOR APPROACH;  Surgeon: Mcarthur Rossetti, MD;  Location: [place];  Service: Orthopedics;  Laterality: Right;  . TOTAL HIP ARTHROPLASTY Left 01/29/2015   Procedure: LEFT TOTAL HIP ARTHROPLASTY ANTERIOR APPROACH;  Surgeon: Mcarthur Rossetti, MD;  Location: [place];  Service: Orthopedics;  Laterality: Left;    Social History:  reports that he quit smoking about 15 years ago. His smoking use included cigarettes. He has never used smokeless tobacco. He reports that he does not drink alcohol or use drugs.  No Known Allergies  Family History  Problem Relation Age of Onset  . Cancer Mother        ?  Marland Kitchen Hypertension Mother   . Heart failure Father        heart transplant  .  Cancer Brother   . Anesthesia problems Neg Hx   . Colon cancer Neg Hx        mother had "3" types of CA- unsure about which one  . Esophageal cancer Neg Hx   . Rectal cancer Neg Hx   . Stomach cancer Neg Hx      Prior to Admission medications   Medication Sig Start Date End Date Taking? Authorizing Provider  atorvastatin (LIPITOR) 40 MG tablet Take 40 mg by mouth daily.   Yes [provider]  cetirizine (ZYRTEC) 10 MG tablet Take 10 mg by mouth daily.  07/16/18  Yes [provider]  cyclobenzaprine (FLEXERIL) 5 MG tablet Take 5 mg by mouth every 8 (eight) hours as needed. 07/10/19  Yes [provider]  fluticasone (FLONASE) 50 MCG/ACT nasal spray Place 1 spray into the nose daily.  07/16/18  Yes [provider]  gabapentin (NEURONTIN) 300 MG capsule Take 300 mg by mouth 2 (two) times daily.  03/24/16  Yes [provider]  inFLIXimab (REMICADE) 100 MG injection Inject 300 mg into the vein every 6 (six) weeks.  04/14/16  Yes [provider]  meloxicam (MOBIC) 15 MG tablet Take 15 mg by mouth daily. 07/18/19  Yes [provider]  montelukast (SINGULAIR) 10 MG tablet Take 10 mg by mouth at bedtime.  01/12/19  Yes [provider]  omeprazole (PRILOSEC) 20 MG capsule Take 20 mg by mouth daily.    Yes [provider]  predniSONE (DELTASONE) 5 MG tablet Take 5 mg by mouth 2 (two) times daily. 07/12/19  Yes [provider]  ciprofloxacin (CIPRO) 500 MG tablet Take 1 tablet (500 mg total) by mouth 2 (two) times daily. Patient not taking: Reported on 08/21/2019 01/28/19   Edrick Kins, DPM  oxyCODONE-acetaminophen (PERCOCET) 5-325 MG tablet Take 1 tablet by mouth every 6 (six) hours as needed for severe pain. Patient not taking: Reported on 08/21/2019 02/17/19   Edrick Kins, DPM  rivaroxaban Alveda Reasons) 10 MG TABS tablet Take 10 mg by mouth daily with breakfast. 06/10/11 07/19/11  Mcarthur Rossetti, MD     Physical Exam: Vitals:   08/21/19 0930 08/21/19 1000 08/21/19 1030 08/21/19 1130  BP: 117/76 122/80  127/76  Pulse: 60 60 61 66  Resp: 17 16 17 19   Temp:      TempSrc:      SpO2: 100% 100% 100% 99%  Weight:      Height:       Wt Readings from Last 3 Encounters:  08/21/19 72.6 kg  07/31/19 72.6 kg  07/23/19 72.6 kg   Body mass index is 28.34 kg/m.  General:  Average built, not in obvious distress, appears frail HENT: Normocephalic, pupils equally reacting to light and accommodation.  No scleral pallor or icterus noted. Oral mucosa is moist.  Chest:  Clear breath sounds.  Diminished breath sounds bilaterally. No crackles or wheezes.  CVS: S1 &S2 heard. No murmur.  Regular  rate and rhythm.  Tenderness on palpation of the chest. Abdomen: Soft, nontender, nondistended.  Bowel sounds are heard.   Extremities: No cyanosis, clubbing or edema.  Peripheral pulses are palpable.  Deformities of hand joints. Psych: Alert, awake and oriented, normal mood CNS:  No cranial nerve deficits.  Power equal in all extremities.    Skin: Warm and dry.  Bilateral lower extremity -Unna boot  Labs on Admission:   CBC: Recent Labs  Lab 08/21/19 0601  WBC 5.9  HGB 10.3*  HCT 33.7*  MCV 81.8  PLT [id number]    Basic Metabolic Panel: Recent Labs  Lab 08/21/19 0601  NA 136  K 3.9  CL 100  CO2 23  GLUCOSE 130*  BUN 11  CREATININE 0.68  CALCIUM 10.3    Liver Function Tests: Recent Labs  Lab 08/21/19 0601  AST 61*  ALT 25  ALKPHOS 104  BILITOT 0.7  PROT 8.7*  ALBUMIN 3.7   Recent Labs  Lab 08/21/19 0601  LIPASE 37   No results for input(s): AMMONIA in the last 168 hours.  Cardiac Enzymes: No results for input(s): CKTOTAL, CKMB, CKMBINDEX, TROPONINI in the last 168 hours.  BNP (last 3 results) No results for input(s): BNP in the last 8760 hours.  ProBNP (last 3 results) No results for input(s): PROBNP in the last 8760 hours.  CBG: No results for input(s): GLUCAP in the  last 168 hours.  Lipase     Component Value Date/Time   LIPASE 37 08/21/2019 0601     Drugs of Abuse  No results found for: LABOPIA, COCAINSCRNUR, LABBENZ, AMPHETMU, THCU, LABBARB    Radiological Exams on Admission: DG Chest 2 View  Result Date: 08/21/2019 CLINICAL DATA:  Chest pain EXAM: CHEST - 2 VIEW COMPARISON:  None. FINDINGS: The heart size and mediastinal contours are within normal limits. Both lungs are clear. The visualized skeletal structures are unremarkable. IMPRESSION: No active cardiopulmonary disease. Electronically Signed   By: Prudencio Pair M.D.   On: 08/21/2019 06:20   CT Angio Chest PE W and/or Wo Contrast  Result Date: 08/21/2019 CLINICAL DATA:  Midline lower back pain.  Positive D-dimer EXAM: CT ANGIOGRAPHY CHEST WITH CONTRAST TECHNIQUE: Multidetector CT imaging of the chest was performed using the standard protocol during bolus administration of intravenous contrast. Multiplanar CT image reconstructions and MIPs were obtained to evaluate the vascular anatomy. CONTRAST:  141mL OMNIPAQUE IOHEXOL 350 MG/ML SOLN COMPARISON:  02/10/2014 FINDINGS: Cardiovascular: No filling defects within the pulmonary arteries to suggest acute pulmonary embolism. Mediastinum/Nodes: No axillary or supraclavicular adenopathy. No mediastinal or hilar lymphadenopathy. No pericardial effusion. Esophagus Lungs/Pleura: No pulmonary infarction. 4 mm nodule in the RIGHT upper lobe (image 56/6) compares to 3 mm on CT 2015. Subpleural nodules in the RIGHT upper lobe (image 45/series 6 and image 37/series 6) are unchanged. No new pulmonary nodules present. Upper Abdomen: Limited view of the liver, kidneys, pancreas are unremarkable. Normal adrenal glands. Musculoskeletal: No aggressive osseous lesion. Endplate osteophytosis present. Review of the MIP images confirms the above findings. IMPRESSION: 1. No evidence acute pulmonary embolism. 2. No acute pulmonary parenchymal findings. 3. Stable bilateral pulmonary  nodules. 4. No acute findings of the spine. Electronically Signed   By: Suzy Bouchard M.D.   On: 08/21/2019 11:35   US Abdomen Limited RUQ  Addendum Date: 08/21/2019   ADDENDUM REPORT: 08/21/2019 08:55 EXAM: Right upper quadrant ultrasound examination. History: Right upper quadrant abdominal pain and chest pain. History of cholecystectomy. Gallbladder: Surgically absent. Echogenic structure  in the gallbladder fossa could be due to surgical clips. Common bile duct: 8.0 mm Liver: Normal echogenicity. No intrahepatic biliary dilatation. Small echogenic lesion noted in the right lobe peripherally measuring 1.2 x 1.1 cm. This is most likely a benign hepatic hemangioma. Normal directional flow in the portal vein. Other: Simple right renal cysts are noted. IMPRESSION: 1. Status post cholecystectomy.  Normal caliber common bile duct. 2. Small echogenic lesion in the liver is likely a benign hemangioma. Not definitely identified on prior ultrasound or CT examinations. Recommend follow-up ultrasound examination and 4-6 months to document stability. Electronically Signed   By: Marijo Sanes M.D.   On: 08/21/2019 08:55   Result Date: 08/21/2019 : Please pick the correct US ABDOMEN LIMITED template. Electronically Signed: By: Marijo Sanes M.D. On: 08/21/2019 08:09    EKG: Personally reviewed by me which shows new Q waves in inferior leads, no ST segment elevation.  Assessment/Plan Principal Problem:   NSTEMI (non-ST elevated myocardial infarction) [facility]) Active Problems:   Arthritis   At high risk for falls   Essential hypertension  [facility] for,  Non-ST elevation MI, continue Lipitor.  Check fasting lipid profile, A1c and TSH.  Patient has been seen by cardiology.  Patient will likely need cardiac cath.  Continue aspirin heparin drip.  No active chest pain at the time of my evaluation.  Elevated D-dimer.  CT angiogram without pulmonary embolism.  rheumatoid arthritis, chronic pain  uses crutches for ambulation, ambulatory difficulty.  On meloxicam at home.  Will hold for now.  Continue prednisone for now.  Patient is supposed to not infliximab next dose 09/03/2019.  Continue Flexeril and gabapentin.  PT OT after acute issues settle.  Bilateral lower extremity chronic venous insufficiency and ulcers on Unna boot.  Continue with that.  Essential hypertension.  Not on any antihypertensive.  Will need to closely monitor.  Stable pulmonary nodules on the CT scan.   DVT Prophylaxis: Heparin drip  Consultant: Cardiology  Code Status: Full code  Microbiology none  Antibiotics: None  Family Communication:  Patients' condition and plan of care including tests being ordered have been discussed with the patient  who indicate understanding and agree with the plan.  Disposition Plan: Home.  Currently lives with his cousin at home.  Uses crutches for ambulation.  Severity of Illness: The appropriate patient status for this patient is OBSERVATION. Observation status is judged to be reasonable and necessary in order to provide the required intensity of service to ensure the patient's safety. The patient's presenting symptoms, physical exam findings, and initial radiographic and laboratory data in the context of their medical condition is felt to place them at decreased risk for further clinical deterioration. Furthermore, it is anticipated that the patient will be medically stable for discharge from the hospital within 2 midnights of admission. The following factors support the patient status of observation.   Signed, Flora Lipps, MD [facility] 08/21/2019

## 2019-08-21 NOTE — ED Notes (Signed)
Date and time results received: 08/21/19 9:11 AM  (use smartphrase ".now" to insert current time)  Test: troponin  Critical Value: [place]  Name of Provider Notified: Cari RN   Orders Received? Or Actions Taken?:

## 2019-08-21 NOTE — ED Provider Notes (Signed)
[facility] DEPT Provider Note   CSN: [id number] Arrival date & time: 08/21/19  0320     History Chief Complaint  Patient presents with  . Back Pain  . Chest Pain    Jonathan [name] is a 58 y.o. male presenting for evaluation of pain.  Patient states he has chronic pain, mostly in his back due to his RA.  Last night, his pain worsened.  Pain is in his low midline back, but radiates both up and down, covering his entire back and bilateral legs.  Additionally, patient states he started to develop abdominal pain and chest pain last night, and has 1 episode of emesis.  He is concerned this may be due to his gallstones, which she has had diagnosed previously.  He denies fevers, chills, cough, shortness of breath, urinary symptoms, abnormal bowel movements.  He has not taken anything for pain.  He is on chronic prednisone and Mobic for his RA, does not have chronic pain medication.  He saw his doctor yesterday due to worsening RA pain, plan is to restart him on IV Remicade, this will start in 2 weeks.  Patient denies recent medication changes.   Additional history obtained from chart review.  Patient with a history of anemia, rheumatoid arthritis, chronic pain, hyperlipidemia foot wounds with poor healing, gastritis.   HPI     Past Medical History:  Diagnosis Date  . Anemia    H/o using iron in the past   . Arthritis    rheumatoid  . Chronic back pain   . Hyperlipidemia   . Joint pain   . Joint swelling   . Rheumatoid arthritis(714.0)     Patient Active Problem List   Diagnosis Date Noted  . Hyperglycemia 12/27/2018  . Cellulitis 12/27/2018  . Wound drainage 12/22/2018  . Foot ulcer ([place]) 12/21/2018  . Encounter for screening for HIV 12/21/2018  . Arthritis 08/22/2018  . HLD (hyperlipidemia) 08/22/2018  . Lung nodule 08/22/2018  . Renal cyst, right 08/22/2018  . Rheumatoid arthritis involving multiple sites with positive rheumatoid factor  ([place]) 08/22/2018  . Venous insufficiency 08/22/2018  . Vitamin D deficiency 08/22/2018  . Seasonal allergic rhinitis 07/16/2018  . Essential hypertension 02/23/2018  . Insomnia 02/23/2018  . Tinea versicolor 02/23/2018  . Antalgic gait 09/21/2017  . Chronic pain of both knees 09/21/2017  . Foot pain, bilateral 09/21/2017  . Elevated liver enzymes 07/10/2017  . Leg edema 06/08/2017  . Primary osteoarthritis, left ankle and foot 05/09/2017  . Post-traumatic osteoarthritis, right ankle and foot 05/09/2017  . Primary osteoarthritis, right ankle and foot 05/09/2017  . Erectile dysfunction 02/15/2017  . Excessive sweating 02/15/2017  . Difficulty transferring 01/27/2017  . Acquired bilateral flat feet 11/15/2016  . Other acquired hammer toe 11/15/2016  . Erosive gastritis 09/05/2016  . At high risk for falls 08/15/2016  . Chronic bilateral low back pain without sciatica 10/13/2015  . Drug therapy 08/03/2015  . Rheumatoid arthritis involving left hip ([place]) 01/29/2015  . 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 Status post total replacement of right hip 11/11/2014  . Gait difficulty 08/21/2013  . Benign neoplasm of colon 04/03/2013  . Reflux esophagitis 04/03/2013  . Microcytic anemia 04/01/2013  . Chronic ulcer of right leg ([place]) 01/13/2013  . Pressure ulcer 01/13/2013  . 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 Degenerative arthritis of knee 06/07/2011    Past Surgical History:  Procedure Laterality Date  . COLONOSCOPY N/A 04/03/2013   Procedure: COLONOSCOPY;  Surgeon: Irene Shipper, MD;  Location: [facility];  Service: Endoscopy;  Laterality: N/A;  . COLONOSCOPY    . ESOPHAGOGASTRODUODENOSCOPY N/A 04/03/2013   Procedure: ESOPHAGOGASTRODUODENOSCOPY (EGD);  Surgeon: Irene Shipper, MD;  Location: [facility];  Service: Endoscopy;  Laterality: N/A;  changed to moderate dr. Henrene Pastor did not want to go to the  o.r. for an endo colon-jmt  . EYE SURGERY Left    "when i was a very small boy"  . JOINT REPLACEMENT     both knees  . KNEE ARTHROPLASTY  06/07/2011   Procedure: COMPUTER ASSISTED TOTAL KNEE ARTHROPLASTY;  Surgeon: Mcarthur Rossetti, MD;  Location: [facility];  Service: Orthopedics;  Laterality: Right;  Right total knee arthoplasty  . KNEE CLOSED REDUCTION  07/19/2011   Procedure: CLOSED MANIPULATION KNEE;  Surgeon: Mcarthur Rossetti, MD;  Location: [facility];  Service: Orthopedics;  Laterality: Right;  Manipulation under anesthesia right knee  . TOTAL HIP ARTHROPLASTY Right 11/11/2014   Procedure: RIGHT TOTAL HIP ARTHROPLASTY ANTERIOR APPROACH;  Surgeon: Mcarthur Rossetti, MD;  Location: [facility];  Service: Orthopedics;  Laterality: Right;  . TOTAL HIP ARTHROPLASTY Left 01/29/2015   Procedure: LEFT TOTAL HIP ARTHROPLASTY ANTERIOR APPROACH;  Surgeon: Mcarthur Rossetti, MD;  Location: [facility];  Service: Orthopedics;  Laterality: Left;       Family History  Problem Relation Age of Onset  . Cancer Mother        ?  Marland Kitchen Hypertension Mother   . Heart failure Father   . Cancer Brother   . Anesthesia problems Neg Hx   . Colon cancer Neg Hx        mother had "3" types of CA- unsure about which one  . Esophageal cancer Neg Hx   . Rectal cancer Neg Hx   . Stomach cancer Neg Hx     Social History   Tobacco Use  . Smoking status: Former Smoker    Types: Cigarettes    Quit date: 04/25/2004    Years since quitting: 15.3  . Smokeless tobacco: Never Used  Substance Use Topics  . Alcohol use: No  . Drug use: No    Home Medications Prior to Admission medications   Medication Sig Start Date End Date Taking? Authorizing Provider  atorvastatin (LIPITOR) 40 MG tablet Take 40 mg by mouth daily.    [provider]  cetirizine (ZYRTEC) 10 MG tablet Take 10 mg by mouth daily.  07/16/18   [provider]  ciprofloxacin (CIPRO) 500 MG tablet Take 1 tablet (500 mg total) by mouth 2  (two) times daily. 01/28/19   Edrick Kins, DPM  cyclobenzaprine (FLEXERIL) 5 MG tablet Take 5 mg by mouth every 8 (eight) hours as needed. 07/10/19   [provider]  fluticasone (FLONASE) 50 MCG/ACT nasal spray Place 1 spray into the nose daily.  07/16/18   [provider]  gabapentin (NEURONTIN) 300 MG capsule Take 300 mg by mouth 2 (two) times daily.  03/24/16   [provider]  inFLIXimab (REMICADE) 100 MG injection Inject 300 mg into the vein. Hasn't started yet 04/14/16   [provider]  meloxicam (MOBIC) 15 MG tablet Take 15 mg by mouth daily. 07/18/19   [provider]  montelukast (SINGULAIR) 10 MG tablet TAKE 1 TABLET BY MOUTH DAILY FOR ALLERGIES AND COUGH. 01/12/19   [provider]  omeprazole (PRILOSEC) 20 MG capsule Take  by mouth.    [provider]  oxyCODONE-acetaminophen (PERCOCET) 5-325 MG tablet Take 1 tablet by mouth every 6 (six) hours as needed for severe pain. 02/17/19   Edrick Kins, DPM  predniSONE (DELTASONE) 5 MG tablet Take 5 mg by mouth 2 (two) times daily. 07/12/19   [provider]  triamcinolone cream (KENALOG) 0.1 %  01/10/19   [provider]  rivaroxaban (XARELTO) 10 MG TABS tablet Take 10 mg by mouth daily with breakfast. 06/10/11 07/19/11  Mcarthur Rossetti, MD    Allergies    Patient has no known allergies.  Review of Systems   Review of Systems  Cardiovascular: Positive for chest pain.  Gastrointestinal: Positive for abdominal pain, nausea and vomiting (x1).  Musculoskeletal: Positive for back pain.  Allergic/Immunologic: Positive for immunocompromised state (chronic prednisone).  All other systems reviewed and are negative.   Physical Exam Updated Vital Signs BP 96/66 Comment: Simultaneous filing. User may not have seen previous data.  Pulse 62 Comment: Simultaneous filing. User may not have seen previous data.  Temp 97.8 F (36.6 C)   Resp (!) 21 Comment:  Simultaneous filing. User may not have seen previous data.  SpO2 100% Comment: Simultaneous filing. User may not have seen previous data.  Physical Exam Vitals and nursing note reviewed.  Constitutional:      General: He is not in acute distress.    Appearance: He is well-developed.     Comments: Resting comfortably in the bed in no acute distress  HENT:     Head: Normocephalic and atraumatic.  Eyes:     Extraocular Movements: Extraocular movements intact.     Conjunctiva/sclera: Conjunctivae normal.     Pupils: Pupils are equal, round, and reactive to light.  Cardiovascular:     Rate and Rhythm: Normal rate and regular rhythm.     Pulses: Normal pulses.  Pulmonary:     Effort: Pulmonary effort is normal. No respiratory distress.     Breath sounds: Normal breath sounds. No wheezing.     Comments: Tenderness palpation of the entire chest wall.  Speaking in full sentences.  Clear lung sounds in all fields. Chest:     Chest wall: Tenderness present.  Abdominal:     General: There is no distension.     Palpations: Abdomen is soft. There is no mass.     Tenderness: There is no abdominal tenderness. There is no guarding or rebound.     Comments: No obvious tenderness on exam.  No rigidity, guarding, distention.  Negative rebound.  No peritonitis.  Negative Murphy's.  Musculoskeletal:        General: Tenderness present. Normal range of motion.     Cervical back: Normal range of motion and neck supple.     Comments: Tenderness palpation of the entire back without focal tenderness.  No step-offs or deformities. Bilateral unna boots present. Distal toes with sensation and good movement.   Skin:    General: Skin is warm and dry.     Capillary Refill: Capillary refill takes less than 2 seconds.  Neurological:     Mental Status: He is alert and oriented to person, place, and time.     ED Results / Procedures / Treatments   Labs (all labs ordered are listed, but only abnormal results  are displayed) Labs Reviewed  CBC - Abnormal; Notable for the following components:      Result Value   RBC 4.12 (*)    Hemoglobin 10.3 (*)  HCT 33.7 (*)    MCH 25.0 (*)    All other components within normal limits  LIPASE, BLOOD  COMPREHENSIVE METABOLIC PANEL  TROPONIN I (HIGH SENSITIVITY)    EKG EKG Interpretation  Date/Time:  Wednesday August 21 2019 05:33:56 EDT Ventricular Rate:  62 PR Interval:    QRS Duration: 93 QT Interval:  426 QTC Calculation: 433 R Axis:   -2 Text Interpretation: Sinus rhythm Prolonged PR interval RSR' in V1 or V2, right VCD or RVH LVH by voltage Inferior infarct, old No significant change since last tracing new inferior q waves Reconfirmed by Varney Biles [phone]) on 08/21/2019 7:33:59 AM    EKG Interpretation  Date/Time:  Wednesday August 21 2019 05:33:56 EDT Ventricular Rate:  62 PR Interval:    QRS Duration: 93 QT Interval:  426 QTC Calculation: 433 R Axis:   -2 Text Interpretation: Sinus rhythm Prolonged PR interval RSR' in V1 or V2, right VCD or RVH LVH by voltage Inferior infarct, old No significant change since last tracing new inferior q waves Reconfirmed by Varney Biles [phone]) on 08/21/2019 7:33:59 AM        Radiology DG Chest 2 View  Result Date: 08/21/2019 CLINICAL DATA:  Chest pain EXAM: CHEST - 2 VIEW COMPARISON:  None. FINDINGS: The heart size and mediastinal contours are within normal limits. Both lungs are clear. The visualized skeletal structures are unremarkable. IMPRESSION: No active cardiopulmonary disease. Electronically Signed   By: Prudencio Pair M.D.   On: 08/21/2019 06:20    Procedures .Critical Care Performed by: Franchot Heidelberg, PA-C Authorized by: Franchot Heidelberg, PA-C   Critical care provider statement:    Critical care time (minutes):  50   Critical care time was exclusive of:  Separately billable procedures and treating other patients and teaching time   Critical care was necessary to treat or  prevent imminent or life-threatening deterioration of the following conditions:  Cardiac failure   Critical care was time spent personally by me on the following activities:  Blood draw for specimens, development of treatment plan with patient or surrogate, discussions with consultants, evaluation of patient's response to treatment, examination of patient, obtaining history from patient or surrogate, ordering and performing treatments and interventions, ordering and review of laboratory studies, ordering and review of radiographic studies, pulse oximetry, review of old charts and re-evaluation of patient's condition   I assumed direction of critical care for this patient from another provider in my specialty: no   Comments:     Pt with elevated trop and story concerning for ACS. Started on heparin   (including critical care time)  Medications Ordered in ED Medications  sodium chloride flush (NS) 0.9 % injection 3 mL (has no administration in time range)  ondansetron (ZOFRAN) tablet 4 mg (4 mg Oral Given 08/21/19 [id number])  HYDROcodone-acetaminophen (NORCO/VICODIN) 5-325 MG per tablet 1 tablet (1 tablet Oral Given 08/21/19 [id number])    ED Course  I have reviewed the triage vital signs and the nursing notes.  Pertinent labs & imaging results that were available during my care of the patient were reviewed by me and considered in my medical decision making (see chart for details).    MDM Rules/Calculators/A&P                      Patient presenting for evaluation of back, chest, abdominal pain.  On exam, patient appears nontoxic.  His back pain sounds like acute on chronic back pain.  However his chest pain  abdominal pain is new.  Patient has had one episode of emesis.  As such, labs including troponin were obtained.  Will give Zofran and Norco for symptom control.  Patient is concerned about possible gallstones, will obtain right upper quadrant ultrasound, this could be contributing to his pain.  Chest  x-ray obtained from triage read interpreted by me, no pneumonia pneumothorax effusion.  Labs interpreted by me, critically elevated troponin at 1700.  This is concerning in the setting of acute onset chest pain with vomiting.  Likely ACS. EKG shows new Q waves in inferior leads. Repeat ekg unchanged.  However patient has also had a surgery in summer 2020 and is in bilateral Unna boots, consider PE although less likely.  Case discussed with attending, Dr. Kathrynn Humble evaluated the patient.  Patient given aspirin and started on heparin drip.  Will consult with cardiology.  Discussed with Serita Butcher, APP with cardiology.  They recommend patient remain at [facility], cardiology will round on the patient.  Recommends admission to hospitalist service.  Repeat troponin just over 3000.  D-dimer elevated at 9.  As such CTA was ordered.  Will call hospitalist for admission.  Discussed with Dr. Louanne Belton from [facility] hospitalist service, pt to be admitted.   Final Clinical Impression(s) / ED Diagnoses Final diagnoses:  Abdominal pain  NSTEMI (non-ST elevated myocardial infarction) [facility])    Rx / [place] Orders ED Discharge Orders    None       Franchot Heidelberg, PA-C 08/21/19 1051    Varney Biles, MD 08/21/19 1411

## 2019-08-21 NOTE — ED Notes (Signed)
While out in lobby, pt says that he is having CP "8/10", says that he was having some chest pain last night, feels like a pressure in the chest. EKG/labs obtained.

## 2019-08-21 NOTE — Consult Note (Addendum)
Cardiology Consultation:   Patient ID: Jonathan [name] MRN: [id number]; DOB: 1960/11/05  Admit date: 08/21/2019 Date of Consult: 08/21/2019  Primary Care Provider: Sinclair Ship, MD Primary Cardiologist: No primary care provider on file. New-Jake Alyn Jurney Primary Electrophysiologist:  None    Patient Profile:   Jonathan [name] is a 59 y.o. 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 Kathrynn Humble.  History of Present Illness:   Jonathan [name] with above hx and no cardiac issues such as MI or arrhythmia now presents to ER early this AM with midline to lower back pain for a year, increased last night and radiated down bilateral legs presented by EMS.  Pt also noted abd pain and chest pain last evening.  1 episode of vomiting.  Hx of gallstones but has had cholecystectomy.   Abd u/s with no acute issues, may have hemangioma.   EKG:  The EKG was personally reviewed and demonstrates:  SR at 66 PR 284ms, LVH and Q waves III, AVF compared to 2015, Q waves more prominent and PR shorter.  But no acute ST elevation Telemetry:  Telemetry was personally reviewed and demonstrates:  SR with PVCs occ    Troponin hs 1717, second 3343  Na 136, K+ 3.9, BUN 11, Cr 0.68, AST 61 and ALT 25 Hgb 10.3, WBC 5.9, pts 329  COVID neg  He has not had pain in his chest before the abd pain and back pain are more chronic.  Interestingly his father had a heart transplant.     Currently still having chest pain, will add NTG paste no SOB lying flat in bed  Past Medical History:  Diagnosis Date  . Anemia    H/o using iron in the past   . Arthritis    rheumatoid  . Chronic back pain   . Hyperlipidemia   . Joint pain   . Joint swelling   . Rheumatoid arthritis(714.0)     Past Surgical History:  Procedure Laterality Date  . COLONOSCOPY N/A  04/03/2013   Procedure: COLONOSCOPY;  Surgeon: Irene Shipper, MD;  Location: [facility];  Service: Endoscopy;  Laterality: N/A;  . COLONOSCOPY    . ESOPHAGOGASTRODUODENOSCOPY N/A 04/03/2013   Procedure: ESOPHAGOGASTRODUODENOSCOPY (EGD);  Surgeon: Irene Shipper, MD;  Location: [facility];  Service: Endoscopy;  Laterality: N/A;  changed to moderate dr. Henrene Pastor did not want to go to the o.r. for an endo colon-jmt  . EYE SURGERY Left    "when i was a very small boy"  . JOINT REPLACEMENT     both knees  . KNEE ARTHROPLASTY  06/07/2011   Procedure: COMPUTER ASSISTED TOTAL KNEE ARTHROPLASTY;  Surgeon: Mcarthur Rossetti, MD;  Location: [place];  Service: Orthopedics;  Laterality: Right;  Right total knee arthoplasty  . KNEE CLOSED REDUCTION  07/19/2011   Procedure: CLOSED MANIPULATION KNEE;  Surgeon: Mcarthur Rossetti, MD;  Location: [place];  Service: Orthopedics;  Laterality: Right;  Manipulation under anesthesia right knee  . TOTAL HIP ARTHROPLASTY Right 11/11/2014   Procedure: RIGHT TOTAL HIP ARTHROPLASTY ANTERIOR APPROACH;  Surgeon: Mcarthur Rossetti, MD;  Location: [place];  Service: Orthopedics;  Laterality: Right;  . TOTAL HIP ARTHROPLASTY Left 01/29/2015   Procedure: LEFT TOTAL HIP ARTHROPLASTY ANTERIOR APPROACH;  Surgeon: Mcarthur Rossetti, MD;  Location: [place];  Service: Orthopedics;  Laterality: Left;     Home Medications:  Prior to Admission medications   Medication Sig Start Date End Date Taking? Authorizing Provider  atorvastatin (LIPITOR) 40 MG tablet Take 40 mg by mouth daily.   Yes [provider]  cetirizine (ZYRTEC) 10 MG tablet Take 10 mg by mouth daily.  07/16/18  Yes [provider]  cyclobenzaprine (FLEXERIL) 5 MG tablet Take 5 mg by mouth every 8 (eight) hours as needed. 07/10/19  Yes [provider]  fluticasone (FLONASE) 50 MCG/ACT nasal spray Place 1 spray into the nose daily.  07/16/18  Yes [provider]  gabapentin  (NEURONTIN) 300 MG capsule Take 300 mg by mouth 2 (two) times daily.  03/24/16  Yes [provider]  inFLIXimab (REMICADE) 100 MG injection Inject 300 mg into the vein every 6 (six) weeks.  04/14/16  Yes [provider]  meloxicam (MOBIC) 15 MG tablet Take 15 mg by mouth daily. 07/18/19  Yes [provider]  montelukast (SINGULAIR) 10 MG tablet Take 10 mg by mouth at bedtime.  01/12/19  Yes [provider]  omeprazole (PRILOSEC) 20 MG capsule Take 20 mg by mouth daily.    Yes [provider]  predniSONE (DELTASONE) 5 MG tablet Take 5 mg by mouth 2 (two) times daily. 07/12/19  Yes [provider]  ciprofloxacin (CIPRO) 500 MG tablet Take 1 tablet (500 mg total) by mouth 2 (two) times daily. Patient not taking: Reported on 08/21/2019 01/28/19   Edrick Kins, DPM  oxyCODONE-acetaminophen (PERCOCET) 5-325 MG tablet Take 1 tablet by mouth every 6 (six) hours as needed for severe pain. Patient not taking: Reported on 08/21/2019 02/17/19   Edrick Kins, DPM  rivaroxaban Alveda Reasons) 10 MG TABS tablet Take 10 mg by mouth daily with breakfast. 06/10/11 07/19/11  Mcarthur Rossetti, MD    Inpatient Medications: Scheduled Meds: . nitroGLYCERIN  0.5 inch Topical Q6H  . sodium chloride (PF)      . sodium chloride flush  3 mL Intravenous Once   Continuous Infusions: . heparin 850 Units/hr (08/21/19 0843)   PRN Meds:   Allergies:   No Known Allergies  Social History:   Social History   Socioeconomic History  . Marital status: Single    Spouse name: Not on file  . Number of children: Not on file  . Years of education: Not on file  . Highest education level: Not on file  Occupational History  . Occupation: Disabled  Tobacco Use  . Smoking status: Former Smoker    Types: Cigarettes    Quit date: 04/25/2004    Years since quitting: 15.3  . Smokeless tobacco: Never Used  Substance and Sexual Activity  . Alcohol use: No  . Drug use: No  .  Sexual activity: Never  Other Topics Concern  . Not on file  Social History Narrative  . Not on file   Social Determinants of Health   Financial Resource Strain:   . Difficulty of Paying Living Expenses:   Food Insecurity:   . Worried About Charity fundraiser in the Last Year:   . Arboriculturist in the Last Year:   Transportation Needs:   . Film/video editor (Medical):   Marland Kitchen Lack of Transportation (Non-Medical):   Physical Activity:   . Days of Exercise per Week:   . Minutes of Exercise per Session:   Stress:   . Feeling of Stress :   Social Connections:   .  Frequency of Communication with Friends and Family:   . Frequency of Social Gatherings with Friends and Family:   . Attends Religious Services:   . Active Member of Clubs or Organizations:   . Attends Archivist Meetings:   Marland Kitchen Marital Status:   Intimate Partner Violence:   . Fear of Current or Ex-Partner:   . Emotionally Abused:   Marland Kitchen Physically Abused:   . Sexually Abused:     Family History:    Family History  Problem Relation Age of Onset  . Cancer Mother        ?  Marland Kitchen Hypertension Mother   . Heart failure Father        heart transplant  . Cancer Brother   . Anesthesia problems Neg Hx   . Colon cancer Neg Hx        mother had "3" types of CA- unsure about which one  . Esophageal cancer Neg Hx   . Rectal cancer Neg Hx   . Stomach cancer Neg Hx      ROS:  Please see the history of present illness.  General:no colds or fevers, no weight changes Skin:no rashes or ulcers HEENT:no blurred vision, no congestion CV:see HPI, venous disease of lower ext with ulcers PUL:see HPI GI:no diarrhea constipation or melena, no indigestion GU:no hematuria, no dysuria MS:no joint pain, no claudication, +RA  Neuro:no syncope, no lightheadedness Endo:no diabetes, no thyroid disease  All other ROS reviewed and negative.     Physical Exam/Data:   Vitals:   08/21/19 0813 08/21/19 0816 08/21/19 0830  08/21/19 0900  BP:   109/72 120/76  Pulse:   (!) 59 64  Resp:   19 18  Temp:      TempSrc:      SpO2:   100% 99%  Weight: 71.7 kg 72.6 kg    Height:  5\' 3"  (1.6 m)     No intake or output data in the 24 hours ending 08/21/19 0959 Last 3 Weights 08/21/2019 08/21/2019 07/31/2019  Weight (lbs) 160 lb 158 lb 160 lb  Weight (kg) 72.576 kg 71.668 kg 72.576 kg     Body mass index is 28.34 kg/m.  General:  Frail male in no acute distress but continues with chest pain, pain in back with movement and + chest wall tenderness  HEENT: normal Lymph: no adenopathy Neck: no JVD Endocrine:  No thryomegaly Vascular: No carotid bruits; pedal pulses 1+ bilaterally   Cardiac:  normal S1, S2; RRR; no murmur gallup rub or click Lungs:  clear to auscultation bilaterally, no wheezing, rhonchi or rales  Abd: soft, nontender, no hepatomegaly  Ext: no edema, Una boots in place Musculoskeletal:  No deformities, BUE and BLE strength normal and equal Skin: warm and dry  Neuro: alert and oriented X 3 MAE slowly, no focal abnormalities noted Psych:  Normal affect     Relevant CV Studies: Will order echo   Laboratory Data:  High Sensitivity Troponin:   Recent Labs  Lab 08/21/19 0601 08/21/19 0814  TROPONINIHS 1,717* 3,343*     Chemistry Recent Labs  Lab 08/21/19 0601  NA 136  K 3.9  CL 100  CO2 23  GLUCOSE 130*  BUN 11  CREATININE 0.68  CALCIUM 10.3  GFRNONAA >60  GFRAA >60  ANIONGAP 13    Recent Labs  Lab 08/21/19 0601  PROT 8.7*  ALBUMIN 3.7  AST 61*  ALT 25  ALKPHOS 104  BILITOT 0.7   Hematology Recent Labs  Lab 08/21/19 0601  WBC 5.9  RBC 4.12*  HGB 10.3*  HCT 33.7*  MCV 81.8  MCH 25.0*  MCHC 30.6  RDW 13.9  PLT 329   BNPNo results for input(s): BNP, PROBNP in the last 168 hours.  DDimer  Recent Labs  Lab 08/21/19 0814  DDIMER 9.10*     Radiology/Studies:  DG Chest 2 View  Result Date: 08/21/2019 CLINICAL DATA:  Chest pain EXAM: CHEST - 2 VIEW  COMPARISON:  None. FINDINGS: The heart size and mediastinal contours are within normal limits. Both lungs are clear. The visualized skeletal structures are unremarkable. IMPRESSION: No active cardiopulmonary disease. Electronically Signed   By: Prudencio Pair M.D.   On: 08/21/2019 06:20   US Abdomen Limited RUQ  Addendum Date: 08/21/2019   ADDENDUM REPORT: 08/21/2019 08:55 EXAM: Right upper quadrant ultrasound examination. History: Right upper quadrant abdominal pain and chest pain. History of cholecystectomy. Gallbladder: Surgically absent. Echogenic structure in the gallbladder fossa could be due to surgical clips. Common bile duct: 8.0 mm Liver: Normal echogenicity. No intrahepatic biliary dilatation. Small echogenic lesion noted in the right lobe peripherally measuring 1.2 x 1.1 cm. This is most likely a benign hepatic hemangioma. Normal directional flow in the portal vein. Other: Simple right renal cysts are noted. IMPRESSION: 1. Status post cholecystectomy.  Normal caliber common bile duct. 2. Small echogenic lesion in the liver is likely a benign hemangioma. Not definitely identified on prior ultrasound or CT examinations. Recommend follow-up ultrasound examination and 4-6 months to document stability. Electronically Signed   By: Marijo Sanes M.D.   On: 08/21/2019 08:55   Result Date: 08/21/2019 : Please pick the correct US ABDOMEN LIMITED template. Electronically Signed: By: Marijo Sanes M.D. On: 08/21/2019 08:09       TIMI Risk Score for Unstable Angina or Non-ST Elevation MI:   The patient's TIMI risk score is 2, which indicates a 8% risk of all cause mortality, new or recurrent myocardial infarction or need for urgent revascularization in the next 14 days.   Assessment and Plan:   1. Chest pain along with abd pain and back pain with radiation to both legs with hx of RA and recent MRI of cervical spine with multilevel degenerative changes throughout cervical spine.  His Troponin is elevated  and no acute ST changes on EKG.  abd pain with neg ultrasound.  Complex picture with several issues.  With elevated troponin he is now on IV heparin awaiting second troponin.  Will need ischemic eval  Cath most likely, pt continues with pain.  Dr. Percival Spanish to see.  He has rec'd ASA vicodin 2. HTN   On arrival 138/90 to 92/53  At home on no BP meds 3. HLD on lipitor no lipids in computer will need to eval 4. RA followed by rheumatology on prednisone and mobic was to begin IV remicade in 2 weeks per pt  5. Chronic pain per IM 6. Lower ext wounds with una boots in place      For questions or updates, please contact [facility] Please consult [email or web address] for contact info under    Signed, Cecilie Kicks, NP  08/21/2019 9:59 AM  History and all data above reviewed.  Patient examined.  I agree with the findings as above.  The patient presents with a history as above.  He has no prior cardiac history.  He gets around with crutches because of his chronic leg problems.  He has chronic back pain.  However, what brought him  to the hospital he said was he was sitting up in his chair rocking back and forth early this morning or late last night with chest pain.  He not had this before.  He said it was sharp.  He did get some nausea.  He did not describe radiation to his jaw or to his arms.  He has not had really improvement here and says he still has 8 out of 10 pain although he is resting comfortably in the bed.  He is not having any new shortness of breath, PND or orthopnea.  He does have the elevated cardiac enzymes is indicated with a trend upward.  He has old inferior Q waves on his EKG but no acute changes.  The patient exam reveals COR:RRR  ,  Lungs: Clear  ,  Abd: Positive bowel sounds, no rebound no guarding, Ext No edema, legs are wrapped in Unna boots.  All available labs, radiology testing, previous records reviewed. Agree with documented assessment and plan. CHEST PAIN:  NQWMI.  Needs cardiac cath.   Will get done today.  The patient understands that risks included but are not limited to stroke (1 in 1000), death (1 in 40), kidney failure [usually temporary] (1 in 500), bleeding (1 in 200), allergic reaction [possibly serious] (1 in 200).  The patient understands and agrees to proceed.   He has not had Xarelto today (which I guess he is on for DVT prophylaxis.  We need to explore this further.  I suspect this can be held if DAPT indicated. )    Minus Breeding  1:14 PM  08/21/2019

## 2019-08-21 NOTE — ED Notes (Signed)
PA notified of troponin, already aware of D-dimer, see orders.

## 2019-08-21 NOTE — H&P (View-Only) (Signed)
Cardiology Consultation:   Patient ID: Jonathan [name] MRN: [id number]; DOB: September 26, 1960  Admit date: 08/21/2019 Date of Consult: 08/21/2019  Primary Care Provider: Sinclair Ship, MD Primary Cardiologist: No primary care provider on file. [place] Yeraldin Litzenberger Primary Electrophysiologist:  None    Patient Profile:   Jonathan [name] is a 59 y.o. 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 Kathrynn Humble.  History of Present Illness:   [name] with above hx and no cardiac issues such as MI or arrhythmia now presents to ER early this AM with midline to lower back pain for a year, increased last night and radiated down bilateral legs presented by EMS.  Pt also noted abd pain and chest pain last evening.  1 episode of vomiting.  Hx of gallstones but has had cholecystectomy.   Abd u/s with no acute issues, may have hemangioma.   EKG:  The EKG was personally reviewed and demonstrates:  SR at 66 PR 252ms, LVH and Q waves III, AVF compared to 2015, Q waves more prominent and PR shorter.  But no acute ST elevation Telemetry:  Telemetry was personally reviewed and demonstrates:  SR with PVCs occ    Troponin hs 1717, second 3343  Na 136, K+ 3.9, BUN 11, Cr 0.68, AST 61 and ALT 25 Hgb 10.3, WBC 5.9, pts 329  COVID neg  He has not had pain in his chest before the abd pain and back pain are more chronic.  Interestingly his father had a heart transplant.     Currently still having chest pain, will add NTG paste no SOB lying flat in bed  Past Medical History:  Diagnosis Date  . Anemia    H/o using iron in the past   . Arthritis    rheumatoid  . Chronic back pain   . Hyperlipidemia   . Joint pain   . Joint swelling   . Rheumatoid arthritis(714.0)     Past Surgical History:  Procedure Laterality Date  . COLONOSCOPY N/A  04/03/2013   Procedure: COLONOSCOPY;  Surgeon: Irene Shipper, MD;  Location: [facility];  Service: Endoscopy;  Laterality: N/A;  . COLONOSCOPY    . ESOPHAGOGASTRODUODENOSCOPY N/A 04/03/2013   Procedure: ESOPHAGOGASTRODUODENOSCOPY (EGD);  Surgeon: Irene Shipper, MD;  Location: [facility];  Service: Endoscopy;  Laterality: N/A;  changed to moderate dr. Henrene Pastor did not want to go to the o.r. for an endo colon-jmt  . EYE SURGERY Left    "when i was a very small boy"  . JOINT REPLACEMENT     both knees  . KNEE ARTHROPLASTY  06/07/2011   Procedure: COMPUTER ASSISTED TOTAL KNEE ARTHROPLASTY;  Surgeon: Mcarthur Rossetti, MD;  Location: [place];  Service: Orthopedics;  Laterality: Right;  Right total knee arthoplasty  . KNEE CLOSED REDUCTION  07/19/2011   Procedure: CLOSED MANIPULATION KNEE;  Surgeon: Mcarthur Rossetti, MD;  Location: [place];  Service: Orthopedics;  Laterality: Right;  Manipulation under anesthesia right knee  . TOTAL HIP ARTHROPLASTY Right 11/11/2014   Procedure: RIGHT TOTAL HIP ARTHROPLASTY ANTERIOR APPROACH;  Surgeon: Mcarthur Rossetti, MD;  Location: [place];  Service: Orthopedics;  Laterality: Right;  . TOTAL HIP ARTHROPLASTY Left 01/29/2015   Procedure: LEFT TOTAL HIP ARTHROPLASTY ANTERIOR APPROACH;  Surgeon: Mcarthur Rossetti, MD;  Location: [place];  Service: Orthopedics;  Laterality: Left;     Home Medications:  Prior to Admission medications   Medication Sig Start Date End Date Taking? Authorizing Provider  atorvastatin (LIPITOR) 40 MG tablet Take 40 mg by mouth daily.   Yes [provider]  cetirizine (ZYRTEC) 10 MG tablet Take 10 mg by mouth daily.  07/16/18  Yes [provider]  cyclobenzaprine (FLEXERIL) 5 MG tablet Take 5 mg by mouth every 8 (eight) hours as needed. 07/10/19  Yes [provider]  fluticasone (FLONASE) 50 MCG/ACT nasal spray Place 1 spray into the nose daily.  07/16/18  Yes [provider]  gabapentin  (NEURONTIN) 300 MG capsule Take 300 mg by mouth 2 (two) times daily.  03/24/16  Yes [provider]  inFLIXimab (REMICADE) 100 MG injection Inject 300 mg into the vein every 6 (six) weeks.  04/14/16  Yes [provider]  meloxicam (MOBIC) 15 MG tablet Take 15 mg by mouth daily. 07/18/19  Yes [provider]  montelukast (SINGULAIR) 10 MG tablet Take 10 mg by mouth at bedtime.  01/12/19  Yes [provider]  omeprazole (PRILOSEC) 20 MG capsule Take 20 mg by mouth daily.    Yes [provider]  predniSONE (DELTASONE) 5 MG tablet Take 5 mg by mouth 2 (two) times daily. 07/12/19  Yes [provider]  ciprofloxacin (CIPRO) 500 MG tablet Take 1 tablet (500 mg total) by mouth 2 (two) times daily. Patient not taking: Reported on 08/21/2019 01/28/19   Edrick Kins, DPM  oxyCODONE-acetaminophen (PERCOCET) 5-325 MG tablet Take 1 tablet by mouth every 6 (six) hours as needed for severe pain. Patient not taking: Reported on 08/21/2019 02/17/19   Edrick Kins, DPM  rivaroxaban Alveda Reasons) 10 MG TABS tablet Take 10 mg by mouth daily with breakfast. 06/10/11 07/19/11  Mcarthur Rossetti, MD    Inpatient Medications: Scheduled Meds: . nitroGLYCERIN  0.5 inch Topical Q6H  . sodium chloride (PF)      . sodium chloride flush  3 mL Intravenous Once   Continuous Infusions: . heparin 850 Units/hr (08/21/19 0843)   PRN Meds:   Allergies:   No Known Allergies  Social History:   Social History   Socioeconomic History  . Marital status: Single    Spouse name: Not on file  . Number of children: Not on file  . Years of education: Not on file  . Highest education level: Not on file  Occupational History  . Occupation: Disabled  Tobacco Use  . Smoking status: Former Smoker    Types: Cigarettes    Quit date: 04/25/2004    Years since quitting: 15.3  . Smokeless tobacco: Never Used  Substance and Sexual Activity  . Alcohol use: No  . Drug use: No  .  Sexual activity: Never  Other Topics Concern  . Not on file  Social History Narrative  . Not on file   Social Determinants of Health   Financial Resource Strain:   . Difficulty of Paying Living Expenses:   Food Insecurity:   . Worried About Charity fundraiser in the Last Year:   . Arboriculturist in the Last Year:   Transportation Needs:   . Film/video editor (Medical):   Marland Kitchen Lack of Transportation (Non-Medical):   Physical Activity:   . Days of Exercise per Week:   . Minutes of Exercise per Session:   Stress:   . Feeling of Stress :   Social Connections:   .  Frequency of Communication with Friends and Family:   . Frequency of Social Gatherings with Friends and Family:   . Attends Religious Services:   . Active Member of Clubs or Organizations:   . Attends Archivist Meetings:   Marland Kitchen Marital Status:   Intimate Partner Violence:   . Fear of Current or Ex-Partner:   . Emotionally Abused:   Marland Kitchen Physically Abused:   . Sexually Abused:     Family History:    Family History  Problem Relation Age of Onset  . Cancer Mother        ?  Marland Kitchen Hypertension Mother   . Heart failure Father        heart transplant  . Cancer Brother   . Anesthesia problems Neg Hx   . Colon cancer Neg Hx        mother had "3" types of CA- unsure about which one  . Esophageal cancer Neg Hx   . Rectal cancer Neg Hx   . Stomach cancer Neg Hx      ROS:  Please see the history of present illness.  General:no colds or fevers, no weight changes Skin:no rashes or ulcers HEENT:no blurred vision, no congestion CV:see HPI, venous disease of lower ext with ulcers PUL:see HPI GI:no diarrhea constipation or melena, no indigestion GU:no hematuria, no dysuria MS:no joint pain, no claudication, +RA  Neuro:no syncope, no lightheadedness Endo:no diabetes, no thyroid disease  All other ROS reviewed and negative.     Physical Exam/Data:   Vitals:   08/21/19 0813 08/21/19 0816 08/21/19 0830  08/21/19 0900  BP:   109/72 120/76  Pulse:   (!) 59 64  Resp:   19 18  Temp:      TempSrc:      SpO2:   100% 99%  Weight: 71.7 kg 72.6 kg    Height:  5\' 3"  (1.6 m)     No intake or output data in the 24 hours ending 08/21/19 0959 Last 3 Weights 08/21/2019 08/21/2019 07/31/2019  Weight (lbs) 160 lb 158 lb 160 lb  Weight (kg) 72.576 kg 71.668 kg 72.576 kg     Body mass index is 28.34 kg/m.  General:  Frail male in no acute distress but continues with chest pain, pain in back with movement and + chest wall tenderness  HEENT: normal Lymph: no adenopathy Neck: no JVD Endocrine:  No thryomegaly Vascular: No carotid bruits; pedal pulses 1+ bilaterally   Cardiac:  normal S1, S2; RRR; no murmur gallup rub or click Lungs:  clear to auscultation bilaterally, no wheezing, rhonchi or rales  Abd: soft, nontender, no hepatomegaly  Ext: no edema, Una boots in place Musculoskeletal:  No deformities, BUE and BLE strength normal and equal Skin: warm and dry  Neuro: alert and oriented X 3 MAE slowly, no focal abnormalities noted Psych:  Normal affect     Relevant CV Studies: Will order echo   Laboratory Data:  High Sensitivity Troponin:   Recent Labs  Lab 08/21/19 0601 08/21/19 0814  TROPONINIHS 1,717* 3,343*     Chemistry Recent Labs  Lab 08/21/19 0601  NA 136  K 3.9  CL 100  CO2 23  GLUCOSE 130*  BUN 11  CREATININE 0.68  CALCIUM 10.3  GFRNONAA >60  GFRAA >60  ANIONGAP 13    Recent Labs  Lab 08/21/19 0601  PROT 8.7*  ALBUMIN 3.7  AST 61*  ALT 25  ALKPHOS 104  BILITOT 0.7   Hematology Recent Labs  Lab 08/21/19 0601  WBC 5.9  RBC 4.12*  HGB 10.3*  HCT 33.7*  MCV 81.8  MCH 25.0*  MCHC 30.6  RDW 13.9  PLT 329   BNPNo results for input(s): BNP, PROBNP in the last 168 hours.  DDimer  Recent Labs  Lab 08/21/19 0814  DDIMER 9.10*     Radiology/Studies:  DG Chest 2 View  Result Date: 08/21/2019 CLINICAL DATA:  Chest pain EXAM: CHEST - 2 VIEW  COMPARISON:  None. FINDINGS: The heart size and mediastinal contours are within normal limits. Both lungs are clear. The visualized skeletal structures are unremarkable. IMPRESSION: No active cardiopulmonary disease. Electronically Signed   By: Prudencio Pair M.D.   On: 08/21/2019 06:20   US Abdomen Limited RUQ  Addendum Date: 08/21/2019   ADDENDUM REPORT: 08/21/2019 08:55 EXAM: Right upper quadrant ultrasound examination. History: Right upper quadrant abdominal pain and chest pain. History of cholecystectomy. Gallbladder: Surgically absent. Echogenic structure in the gallbladder fossa could be due to surgical clips. Common bile duct: 8.0 mm Liver: Normal echogenicity. No intrahepatic biliary dilatation. Small echogenic lesion noted in the right lobe peripherally measuring 1.2 x 1.1 cm. This is most likely a benign hepatic hemangioma. Normal directional flow in the portal vein. Other: Simple right renal cysts are noted. IMPRESSION: 1. Status post cholecystectomy.  Normal caliber common bile duct. 2. Small echogenic lesion in the liver is likely a benign hemangioma. Not definitely identified on prior ultrasound or CT examinations. Recommend follow-up ultrasound examination and 4-6 months to document stability. Electronically Signed   By: Marijo Sanes M.D.   On: 08/21/2019 08:55   Result Date: 08/21/2019 : Please pick the correct US ABDOMEN LIMITED template. Electronically Signed: By: Marijo Sanes M.D. On: 08/21/2019 08:09       TIMI Risk Score for Unstable Angina or Non-ST Elevation MI:   The patient's TIMI risk score is 2, which indicates a 8% risk of all cause mortality, new or recurrent myocardial infarction or need for urgent revascularization in the next 14 days.   Assessment and Plan:   1. Chest pain along with abd pain and back pain with radiation to both legs with hx of RA and recent MRI of cervical spine with multilevel degenerative changes throughout cervical spine.  His Troponin is elevated  and no acute ST changes on EKG.  abd pain with neg ultrasound.  Complex picture with several issues.  With elevated troponin he is now on IV heparin awaiting second troponin.  Will need ischemic eval  Cath most likely, pt continues with pain.  Dr. Percival Spanish to see.  He has rec'd ASA vicodin 2. HTN   On arrival 138/90 to 92/53  At home on no BP meds 3. HLD on lipitor no lipids in computer will need to eval 4. RA followed by rheumatology on prednisone and mobic was to begin IV remicade in 2 weeks per pt  5. Chronic pain per IM 6. Lower ext wounds with una boots in place      For questions or updates, please contact [place] Please consult [email or web address] for contact info under    Signed, Cecilie Kicks, NP  08/21/2019 9:59 AM  History and all data above reviewed.  Patient examined.  I agree with the findings as above.  The patient presents with a history as above.  He has no prior cardiac history.  He gets around with crutches because of his chronic leg problems.  He has chronic back pain.  However, what brought him  to the hospital he said was he was sitting up in his chair rocking back and forth early this morning or late last night with chest pain.  He not had this before.  He said it was sharp.  He did get some nausea.  He did not describe radiation to his jaw or to his arms.  He has not had really improvement here and says he still has 8 out of 10 pain although he is resting comfortably in the bed.  He is not having any new shortness of breath, PND or orthopnea.  He does have the elevated cardiac enzymes is indicated with a trend upward.  He has old inferior Q waves on his EKG but no acute changes.  The patient exam reveals COR:RRR  ,  Lungs: Clear  ,  Abd: Positive bowel sounds, no rebound no guarding, Ext No edema, legs are wrapped in Unna boots.  All available labs, radiology testing, previous records reviewed. Agree with documented assessment and plan. CHEST PAIN:  NQWMI.  Needs cardiac cath.   Will get done today.  The patient understands that risks included but are not limited to stroke (1 in 1000), death (1 in 77), kidney failure [usually temporary] (1 in 500), bleeding (1 in 200), allergic reaction [possibly serious] (1 in 200).  The patient understands and agrees to proceed.   He has not had Xarelto today (which I guess he is on for DVT prophylaxis.  We need to explore this further.  I suspect this can be held if DAPT indicated. )    Minus Breeding  1:14 PM  08/21/2019

## 2019-08-21 NOTE — ED Notes (Signed)
IV team at bedside for 2nd line.

## 2019-08-21 NOTE — Interval H&P Note (Signed)
Cath Lab Visit (complete for each Cath Lab visit)  Clinical Evaluation Leading to the Procedure:   ACS: Yes.    Non-ACS:    Anginal Classification: CCS III  Anti-ischemic medical therapy: Minimal Therapy (1 class of medications)  Non-Invasive Test Results: No non-invasive testing performed  Prior CABG: No previous CABG      History and Physical Interval Note:  08/21/2019 3:15 PM  Jonathan [name]  has presented today for surgery, with the diagnosis of nonstemi.  The various methods of treatment have been discussed with the patient and family. After consideration of risks, benefits and other options for treatment, the patient has consented to  Procedure(s): LEFT HEART CATH AND CORONARY ANGIOGRAPHY (N/A) as a surgical intervention.  The patient's history has been reviewed, patient examined, no change in status, stable for surgery.  I have reviewed the patient's chart and labs.  Questions were answered to the patient's satisfaction.     Belva Crome III

## 2019-08-21 NOTE — ED Notes (Signed)
Pt vomiting clear yellow emesis. Pt says the last time he felt this way he had gall stones.

## 2019-08-21 NOTE — Progress Notes (Addendum)
ANTICOAGULATION CONSULT NOTE - Initial Consult  Pharmacy Consult for IV heparin Indication: chest pain/NSTEMI  No Known Allergies  Patient Measurements: Height: 5\' 3"  (160 cm) Weight: 72.6 kg (160 lb) IBW/kg (Calculated) : 56.9  Heparin Dosing Weight: 71.6 kg  Vital Signs: Temp: 97.8 F (36.6 C) (04/28 0630) Temp Source: Oral (04/28 0541) BP: 105/72 (04/28 0715) Pulse Rate: 59 (04/28 0715)  Labs: Recent Labs    08/21/19 0601  HGB 10.3*  HCT 33.7*  PLT 329  CREATININE 0.68  TROPONINIHS 1,717*    Estimated Creatinine Clearance: 90 mL/min (by C-G formula based on SCr of 0.68 mg/dL).   Medical History: Past Medical History:  Diagnosis Date  . Anemia    H/o using iron in the past   . Arthritis    rheumatoid  . Chronic back pain   . Hyperlipidemia   . Joint pain   . Joint swelling   . Rheumatoid arthritis(714.0)     Assessment: 66 y/oM who presented to [facility] ED on 08/21/2019 with chest pain along with abdominal pain and back pain with radiation to both legs. Troponin, d-dimer elevated. Pharmacy consulted for IV heparin dosing for NSTEMI. Patient not on anticoagulation PTA. CBC: Hgb low, but stable. Pltc WNL. CTa chest ordered.  Goal of Therapy:  Heparin level 0.3-0.7 units/ml Monitor platelets by anticoagulation protocol: Yes   Plan:   Baseline aPTT, PT/INR  Heparin 4000 units IV bolus x 1, then start heparin infusion at 850 units/hr  Heparin level 6 hours after initiation  Daily CBC, heparin level  Monitor closely for s/sx of bleeding   Lindell Spar, PharmD, BCPS Clinical Pharmacist  08/21/2019,8:20 AM

## 2019-08-21 NOTE — CV Procedure (Signed)
   Left heart cath via right femoral.  Real-time vascular ultrasound used for guidance.  Unable to use right radial access due to severe arthritis and inability to pronate and supinate the wrist.  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  Faint left to right collaterals.  Left coronary system is widely patent.  Inferior wall on ventriculography is akinetic.  LVEDP is normal.  EKG demonstrates well-formed inferior Q waves without accompanying ST-T wave changes to suggest acute or recent occlusion of RCA.  Conservative medical management without attempted PCI.

## 2019-08-22 ENCOUNTER — Encounter (HOSPITAL_BASED_OUTPATIENT_CLINIC_OR_DEPARTMENT_OTHER): Payer: Medicare Other | Admitting: Internal Medicine

## 2019-08-22 ENCOUNTER — Observation Stay (HOSPITAL_COMMUNITY): Payer: Medicare Other

## 2019-08-22 DIAGNOSIS — D638 Anemia in other chronic diseases classified elsewhere: Secondary | ICD-10-CM | POA: Diagnosis present

## 2019-08-22 DIAGNOSIS — I255 Ischemic cardiomyopathy: Secondary | ICD-10-CM | POA: Diagnosis present

## 2019-08-22 DIAGNOSIS — I83009 Varicose veins of unspecified lower extremity with ulcer of unspecified site: Secondary | ICD-10-CM | POA: Diagnosis present

## 2019-08-22 DIAGNOSIS — L97101 Non-pressure chronic ulcer of unspecified thigh limited to breakdown of skin: Secondary | ICD-10-CM

## 2019-08-22 DIAGNOSIS — Z79899 Other long term (current) drug therapy: Secondary | ICD-10-CM | POA: Diagnosis not present

## 2019-08-22 DIAGNOSIS — I5021 Acute systolic (congestive) heart failure: Secondary | ICD-10-CM | POA: Diagnosis present

## 2019-08-22 DIAGNOSIS — I10 Essential (primary) hypertension: Secondary | ICD-10-CM

## 2019-08-22 DIAGNOSIS — I2510 Atherosclerotic heart disease of native coronary artery without angina pectoris: Secondary | ICD-10-CM | POA: Diagnosis present

## 2019-08-22 DIAGNOSIS — I878 Other specified disorders of veins: Secondary | ICD-10-CM | POA: Diagnosis present

## 2019-08-22 DIAGNOSIS — M05711 Rheumatoid arthritis with rheumatoid factor of right shoulder without organ or systems involvement: Secondary | ICD-10-CM

## 2019-08-22 DIAGNOSIS — I872 Venous insufficiency (chronic) (peripheral): Secondary | ICD-10-CM | POA: Diagnosis present

## 2019-08-22 DIAGNOSIS — Z7901 Long term (current) use of anticoagulants: Secondary | ICD-10-CM | POA: Diagnosis not present

## 2019-08-22 DIAGNOSIS — I110 Hypertensive heart disease with heart failure: Secondary | ICD-10-CM | POA: Diagnosis present

## 2019-08-22 DIAGNOSIS — M069 Rheumatoid arthritis, unspecified: Secondary | ICD-10-CM | POA: Diagnosis present

## 2019-08-22 DIAGNOSIS — I83001 Varicose veins of unspecified lower extremity with ulcer of thigh: Secondary | ICD-10-CM

## 2019-08-22 DIAGNOSIS — G894 Chronic pain syndrome: Secondary | ICD-10-CM | POA: Diagnosis present

## 2019-08-22 DIAGNOSIS — E785 Hyperlipidemia, unspecified: Secondary | ICD-10-CM | POA: Diagnosis present

## 2019-08-22 DIAGNOSIS — Z9181 History of falling: Secondary | ICD-10-CM

## 2019-08-22 DIAGNOSIS — I214 Non-ST elevation (NSTEMI) myocardial infarction: Secondary | ICD-10-CM | POA: Diagnosis present

## 2019-08-22 DIAGNOSIS — Z8249 Family history of ischemic heart disease and other diseases of the circulatory system: Secondary | ICD-10-CM | POA: Diagnosis not present

## 2019-08-22 DIAGNOSIS — E876 Hypokalemia: Secondary | ICD-10-CM | POA: Diagnosis not present

## 2019-08-22 DIAGNOSIS — M1990 Unspecified osteoarthritis, unspecified site: Secondary | ICD-10-CM | POA: Diagnosis not present

## 2019-08-22 DIAGNOSIS — Z791 Long term (current) use of non-steroidal anti-inflammatories (NSAID): Secondary | ICD-10-CM | POA: Diagnosis not present

## 2019-08-22 DIAGNOSIS — R079 Chest pain, unspecified: Secondary | ICD-10-CM

## 2019-08-22 DIAGNOSIS — D649 Anemia, unspecified: Secondary | ICD-10-CM | POA: Diagnosis not present

## 2019-08-22 DIAGNOSIS — Z96643 Presence of artificial hip joint, bilateral: Secondary | ICD-10-CM | POA: Diagnosis present

## 2019-08-22 DIAGNOSIS — Z87891 Personal history of nicotine dependence: Secondary | ICD-10-CM | POA: Diagnosis not present

## 2019-08-22 DIAGNOSIS — Z7952 Long term (current) use of systemic steroids: Secondary | ICD-10-CM | POA: Diagnosis not present

## 2019-08-22 DIAGNOSIS — Z20822 Contact with and (suspected) exposure to covid-19: Secondary | ICD-10-CM | POA: Diagnosis present

## 2019-08-22 DIAGNOSIS — Z96653 Presence of artificial knee joint, bilateral: Secondary | ICD-10-CM | POA: Diagnosis present

## 2019-08-22 DIAGNOSIS — I2582 Chronic total occlusion of coronary artery: Secondary | ICD-10-CM | POA: Diagnosis present

## 2019-08-22 DIAGNOSIS — I5023 Acute on chronic systolic (congestive) heart failure: Secondary | ICD-10-CM | POA: Diagnosis present

## 2019-08-22 HISTORY — PX: TRANSTHORACIC ECHOCARDIOGRAM: SHX275

## 2019-08-22 LAB — COMPREHENSIVE METABOLIC PANEL
ALT: 30 U/L (ref 0–44)
AST: 196 U/L — ABNORMAL HIGH (ref 15–41)
Albumin: 2.5 g/dL — ABNORMAL LOW (ref 3.5–5.0)
Alkaline Phosphatase: 85 U/L (ref 38–126)
Anion gap: 9 (ref 5–15)
BUN: 8 mg/dL (ref 6–20)
CO2: 22 mmol/L (ref 22–32)
Calcium: 9.4 mg/dL (ref 8.9–10.3)
Chloride: 104 mmol/L (ref 98–111)
Creatinine, Ser: 0.64 mg/dL (ref 0.61–1.24)
GFR calc Af Amer: >60 mL/min (ref >60)
GFR calc non Af Amer: >60 mL/min (ref >60)
Glucose, Bld: 115 mg/dL — ABNORMAL HIGH (ref 70–99)
Potassium: 3.9 mmol/L (ref 3.5–5.1)
Sodium: 135 mmol/L (ref 135–145)
Total Bilirubin: 0.6 mg/dL (ref 0.3–1.2)
Total Protein: 6.8 g/dL (ref 6.5–8.1)

## 2019-08-22 LAB — CBC
HCT: 27.6 % — ABNORMAL LOW (ref 39.0–52.0)
Hemoglobin: 8.8 g/dL — ABNORMAL LOW (ref 13.0–17.0)
MCH: 25.6 pg — ABNORMAL LOW (ref 26.0–34.0)
MCHC: 31.9 g/dL (ref 30.0–36.0)
MCV: 80.2 fL (ref 80.0–100.0)
Platelets: 283 10*3/uL (ref 150–400)
RBC: 3.44 MIL/uL — ABNORMAL LOW (ref 4.22–5.81)
RDW: 13.8 % (ref 11.5–15.5)
WBC: 7.2 10*3/uL (ref 4.0–10.5)
nRBC: 0 % (ref 0.0–0.2)

## 2019-08-22 LAB — ECHOCARDIOGRAM COMPLETE
Height: 63 in
Weight: 2352.75 oz

## 2019-08-22 MED ORDER — ATORVASTATIN CALCIUM 40 MG PO TABS
40.0000 mg | ORAL_TABLET | Freq: Every day | ORAL | Status: DC
  Administered 2019-08-22 – 2019-08-24 (×3): 40 mg via ORAL
  Filled 2019-08-22 (×3): qty 1

## 2019-08-22 MED ORDER — ISOSORBIDE MONONITRATE ER 60 MG PO TB24
60.0000 mg | ORAL_TABLET | Freq: Every day | ORAL | Status: DC
  Administered 2019-08-22 – 2019-08-24 (×3): 60 mg via ORAL
  Filled 2019-08-22 (×3): qty 1

## 2019-08-22 NOTE — Progress Notes (Addendum)
PROGRESS NOTE    Jonathan [name]  [id number] DOB: 03-Aug-1960 DOA: 08/21/2019 PCP: Jonathan Ship, MD     Brief Narrative:  59 y.o. BM PMHx rheumatoid arthritis, polyclonal gammopathy, vitamin B12 deficiency, hyperlipidemia, chronic venous insufficiency with bilateral lower extremity Unna boots and ulcers and chronic pain   Presented to hospital with generalized body pain chest pain and back pain getting worse for the last 1 week.  Patient denies any increasing dyspnea, diaphoresis palpitation.  No mention of fever, cough, chills or rigor.  No urinary urgency, frequency or dysuria.  No nausea, abdominal pain but had one episode of vomiting today.  Patient usually ambulates with the help of the crutches at home and lives with his cousin.  Patient complained of generalized tenderness over his joints.  He also complains of back pain with radiation to the legs.  He is on prednisone and Mobic as outpatient and is supposed to get infliximab every 6 weeks.  Patient complains of bilateral lower extremity swelling and is on [employer].   ED Course: In the ED, patient complains of diffuse pain.  He remained hemodynamically stable.  EKG had Q waves in inferior leads. Troponin was elevated significantly.  First set of troponin was 1717 followed by 3343.  D-dimer was also elevated at 9.1.  hemoglobin of 10.3.  Creatinine 0.6.  Platelets within normal limits.  CT angiogram of the chest was planned for elevated d dimer.  Covid test was negative.  Cardiology was consulted and patient was considered for admission to the hospital. Patient was given aspirin and heparin drip in the ED.    Subjective: A/O x4, negative S OB, negative abdominal pain.  Reproducible chest pain 3/10 over left breast with with palpation.   Assessment & Plan:   Principal Problem:   NSTEMI (non-ST elevated myocardial infarction) ([place]) Active Problems:   Arthritis   At high risk for falls   Essential hypertension   Acute systolic  CHF (congestive heart failure) (HCC)   Chronic pain syndrome   Rheumatoid arthritis (HCC)   Venous stasis ulcers (HCC)   Anemia, unspecified  NSTEMI/Acute Systolic CHF Results for Jonathan [name], Jonathan [name] (MRN [id number]) as of 08/22/2019 10:45  Ref. Range 08/21/2019 06:01 08/21/2019 08:14  Troponin I (High Sensitivity) Latest Ref Range: <18 ng/L 1,717 [facility]) 3,343 [facility])  -4/28 s/p cardiac catheterization EF 40 to 45% see results below -Imdur 60 mg daily -Strict in and out +1.6 L -Daily weight Filed Weights   08/21/19 0813 08/21/19 0816 08/22/19 0017  Weight: 71.7 kg 72.6 kg 66.7 kg   Essential HTN -See CHF  Elevated D-dimer.   -CT angiogram without pulmonary embolism.  Rheumatoid arthritis/Chronic Pain syndrome,  -Chronic pain uses crutches for ambulation, ambulatory difficulty.   -Flexeril 5 mg PRN -Gabapentin 300 mg BID -Oxycodone 5 -10 mg PRN -Prednisone 5 mg BID -Tramadol 50 mg PRN  Bilateral lower extremity chronic venous insufficiency -Per patient has been going on for approximately 1 year.  Nurse has been coming out to his cousin's house to change dressings -Continue Unna boot  Anemia -Anemia panel pending -Occult blood pending -Transfuse for hemoglobin<7  Goals of care -4/29 PT/OT consult patient was residing at cousin's house.  Patient very debilitated evaluate for SNF vs assisted living   DVT prophylaxis: Subcu heparin Code Status: Full Family Communication:  Disposition Plan: TBD 1.  Where the patient is from 2.  Anticipated d/c place. 3.  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  Faint left to right collaterals are noted.  Widely patent left main  Luminal irregularities noted in the mid to distal circumflex.  No high-grade focal obstruction.  The and diagonals are large without evidence of focal significant  obstruction.  Left ventriculography reveals inferior wall akinesis.  EF is 40 to 45%.  The EDP is normal.  Ongoing chest discomfort described as sharp and worse with breathing raising question of post infarct pericarditis.  RECOMMENDATION:   After reviewing clinical data including electrocardiograms that demonstrate well-formed inferior lateral Q waves without acute ST-T wave change, the data suggests that infarction involving the inferior wall is not acute.  Q waves began forming 18 to 24 hours post infarction.  It would be unlikely that an acute infarct starting within the past 18 hours involving the inferior wall would not have ST-T wave abnormality. Discussed with Dr. Percival [name].  4/29 echocardiogram; pending reading  I have personally reviewed and interpreted all radiology studies and my findings are as above.  VENTILATOR SETTINGS:    Cultures   Antimicrobials:    Devices    LINES / TUBES:      Continuous Infusions: . sodium chloride 100 mL/hr at 08/22/19 0303  . sodium chloride       Objective: Vitals:   08/22/19 0017 08/22/19 0301 08/22/19 0730 08/22/19 1206  BP:  104/76 129/81 127/80  Pulse:  79 (!) 102 93  Resp:  18 17 17   Temp:  98.5 F (36.9 C) 99.6 F (37.6 C) 100.2 F (37.9 C)  TempSrc:  Oral Oral Oral  SpO2:  98% 98% 100%  Weight: 66.7 kg     Height:        Intake/Output Summary (Last 24 hours) at 08/22/2019 1227 Last data filed at 08/22/2019 0600 Gross per 24 hour  Intake 1084.97 ml  Output 525 ml  Net 559.97 ml   Filed Weights   08/21/19 0813 08/21/19 0816 08/22/19 0017  Weight: 71.7 kg 72.6 kg 66.7 kg    Examination:  General:A/O x4, no acute respiratory distress, cachectic Eyes: negative scleral hemorrhage, negative anisocoria, negative icterus ENT: Negative Runny nose, negative gingival bleeding, Neck:  Negative scars, masses, torticollis, lymphadenopathy, JVD Lungs: Clear to auscultation bilaterally without wheezes or  crackles Cardiovascular: Regular rate and rhythm without murmur gallop or rub normal S1 and S2 Abdomen: negative abdominal pain, nondistended, positive soft, bowel sounds, no rebound, no ascites, no appreciable mass Extremities: Bilateral lower extremities Unna boots present did not remove. Skin: Negative rashes, lesions, ulcers Psychiatric:  Negative depression, negative anxiety, negative fatigue, negative mania  Central nervous system:  Cranial nerves II through XII intact, tongue/uvula midline, all extremities muscle strength 5/5, sensation intact throughout, negative dysarthria, negative expressive aphasia, negative receptive aphasia.  .     Data Reviewed: Care during the described time interval was provided by me .  I have reviewed this patient's available data, including medical history, events of note, physical examination, and all test results as part of my evaluation.  CBC: Recent Labs  Lab 08/21/19 0601 08/21/19 1712 08/22/19 0350  WBC 5.9 5.0 7.2  HGB 10.3* 9.1* 8.8*  HCT 33.7* 29.1* 27.6*  MCV 81.8 79.9* 80.2  PLT 329 267 [id number]   Basic Metabolic Panel: Recent Labs  Lab 08/21/19 0601 08/21/19 1712 08/22/19 0350  NA 136  --  135  K 3.9  --  3.9  CL 100  --  104  CO2 23  --  22  GLUCOSE 130*  --  115*  BUN 11  --  8  CREATININE 0.68 0.61 0.64  CALCIUM 10.3  --  9.4   GFR: Estimated Creatinine Clearance: 81 mL/min (by C-G formula based on SCr of 0.64 mg/dL). Liver Function Tests: Recent Labs  Lab 08/21/19 0601 08/22/19 0350  AST 61* 196*  ALT 25 30  ALKPHOS 104 85  BILITOT 0.7 0.6  PROT 8.7* 6.8  ALBUMIN 3.7 2.5*   Recent Labs  Lab 08/21/19 0601  LIPASE 37   No results for input(s): AMMONIA in the last 168 hours. Coagulation Profile: Recent Labs  Lab 08/21/19 0814  INR 1.1   Cardiac Enzymes: No results for input(s): CKTOTAL, CKMB, CKMBINDEX, TROPONINI in the last 168 hours. BNP (last 3 results) No results for input(s): PROBNP in the last  8760 hours. HbA1C: Recent Labs    08/21/19 1712  HGBA1C 5.7*   CBG: No results for input(s): GLUCAP in the last 168 hours. Lipid Profile: Recent Labs    08/21/19 1712  CHOL 175  HDL 47  LDLCALC 113*  TRIG 75  CHOLHDL 3.7   Thyroid Function Tests: Recent Labs    08/21/19 1712  TSH 1.646   Anemia Panel: No results for input(s): VITAMINB12, FOLATE, FERRITIN, TIBC, IRON, RETICCTPCT in the last 72 hours. Sepsis Labs: No results for input(s): PROCALCITON, LATICACIDVEN in the last 168 hours.  Recent Results (from the past 240 hour(s))  Respiratory Panel by RT PCR (Flu A&B, Covid) - Nasopharyngeal Swab     Status: None   Collection Time: 08/21/19  7:27 AM   Specimen: Nasopharyngeal Swab  Result Value Ref Range Status   SARS Coronavirus 2 by RT PCR NEGATIVE NEGATIVE Final    Comment: (NOTE) SARS-CoV-2 target nucleic acids are NOT DETECTED. The SARS-CoV-2 RNA is generally detectable in upper respiratoy specimens during the acute phase of infection. The lowest concentration of SARS-CoV-2 viral copies this assay can detect is 131 copies/mL. A negative result does not preclude SARS-Cov-2 infection and should not be used as the sole basis for treatment or other patient management decisions. A negative result may occur with  improper specimen collection/handling, submission of specimen other than nasopharyngeal swab, presence of viral mutation(s) within the areas targeted by this assay, and inadequate number of viral copies (<131 copies/mL). A negative result must be combined with clinical observations, patient history, and epidemiological information. The expected result is Negative. Fact Sheet for Patients:  PinkCheek.be Fact Sheet for Healthcare Providers:  GravelBags.it This test is not yet ap proved or cleared by the [place] FDA and  has been authorized for detection and/or diagnosis of SARS-CoV-2 by FDA  under an Emergency Use Authorization (EUA). This EUA will remain  in effect (meaning this test can be used) for the duration of the COVID-19 declaration under Section 564(b)(1) of the Act, 21 U.S.C. section 360bbb-3(b)(1), unless the authorization is terminated or revoked sooner.    Influenza A by PCR NEGATIVE NEGATIVE Final   Influenza B by PCR NEGATIVE NEGATIVE Final    Comment: (NOTE) The Xpert Xpress SARS-CoV-2/FLU/RSV assay is intended as an aid in  the diagnosis of influenza from Nasopharyngeal swab specimens and  should not be used as a sole basis for treatment. Nasal washings and  aspirates are unacceptable for Xpert Xpress SARS-CoV-2/FLU/RSV  testing. Fact Sheet for Patients: PinkCheek.be Fact Sheet for Healthcare Providers: GravelBags.it This test is not yet approved or cleared by the [place] FDA  and  has been authorized for detection and/or diagnosis of SARS-CoV-2 by  FDA under an Emergency Use Authorization (EUA). This EUA will remain  in effect (meaning this test can be used) for the duration of the  Covid-19 declaration under Section 564(b)(1) of the Act, 21  U.S.C. section 360bbb-3(b)(1), unless the authorization is  terminated or revoked. Performed at [facility], [place] [address] [place], [place] [address]          Radiology Studies: DG Chest 2 View  Result Date: 08/21/2019 CLINICAL DATA:  Chest pain EXAM: CHEST - 2 VIEW COMPARISON:  None. FINDINGS: The heart size and mediastinal contours are within normal limits. Both lungs are clear. The visualized skeletal structures are unremarkable. IMPRESSION: No active cardiopulmonary disease. Electronically Signed   By: Prudencio Pair M.D.   On: 08/21/2019 06:20   CT Angio Chest PE W and/or Wo Contrast  Result Date: 08/21/2019 CLINICAL DATA:  Midline lower back pain.  Positive D-dimer EXAM: CT ANGIOGRAPHY CHEST WITH CONTRAST TECHNIQUE:  Multidetector CT imaging of the chest was performed using the standard protocol during bolus administration of intravenous contrast. Multiplanar CT image reconstructions and MIPs were obtained to evaluate the vascular anatomy. CONTRAST:  160mL OMNIPAQUE IOHEXOL 350 MG/ML SOLN COMPARISON:  02/10/2014 FINDINGS: Cardiovascular: No filling defects within the pulmonary arteries to suggest acute pulmonary embolism. Mediastinum/Nodes: No axillary or supraclavicular adenopathy. No mediastinal or hilar lymphadenopathy. No pericardial effusion. Esophagus Lungs/Pleura: No pulmonary infarction. 4 mm nodule in the RIGHT upper lobe (image 56/6) compares to 3 mm on CT 2015. Subpleural nodules in the RIGHT upper lobe (image 45/series 6 and image 37/series 6) are unchanged. No new pulmonary nodules present. Upper Abdomen: Limited view of the liver, kidneys, pancreas are unremarkable. Normal adrenal glands. Musculoskeletal: No aggressive osseous lesion. Endplate osteophytosis present. Review of the MIP images confirms the above findings. IMPRESSION: 1. No evidence acute pulmonary embolism. 2. No acute pulmonary parenchymal findings. 3. Stable bilateral pulmonary nodules. 4. No acute findings of the spine. Electronically Signed   By: Suzy Bouchard M.D.   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  Faint left to right collaterals are noted.  Widely patent left main  Luminal irregularities noted in the mid to distal circumflex.  No high-grade focal obstruction.  The and diagonals are large without evidence of focal significant obstruction.  Left ventriculography reveals inferior wall akinesis.  EF is 40 to 45%.  The EDP is normal.  Ongoing chest discomfort described as sharp and worse with breathing raising question of post infarct pericarditis. RECOMMENDATION:  After reviewing clinical data including electrocardiograms that demonstrate well-formed  inferior lateral Q waves without acute ST-T wave change, the data suggests that infarction involving the inferior wall is not acute.  Q waves began forming 18 to 24 hours post infarction.  It would be unlikely that an acute infarct starting within the past 18 hours involving the inferior wall would not have ST-T wave abnormality.  Discussed with Dr. Percival [name].  US Abdomen Limited RUQ  Addendum Date: 08/21/2019   ADDENDUM REPORT: 08/21/2019 08:55 EXAM: Right upper quadrant ultrasound examination. History: Right upper quadrant abdominal pain and chest pain. History of cholecystectomy. Gallbladder: Surgically absent. Echogenic structure in the gallbladder fossa could be due to surgical clips. Common bile duct: 8.0 mm Liver: Normal echogenicity. No intrahepatic biliary dilatation. Small echogenic lesion noted in the right lobe peripherally measuring 1.2 x 1.1 cm.  This is most likely a benign hepatic hemangioma. Normal directional flow in the portal vein. Other: Simple right renal cysts are noted. IMPRESSION: 1. Status post cholecystectomy.  Normal caliber common bile duct. 2. Small echogenic lesion in the liver is likely a benign hemangioma. Not definitely identified on prior ultrasound or CT examinations. Recommend follow-up ultrasound examination and 4-6 months to document stability. Electronically Signed   By: Marijo Sanes M.D.   On: 08/21/2019 08:55   Result Date: 08/21/2019 : Please pick the correct US ABDOMEN LIMITED template. Electronically Signed: By: Marijo Sanes M.D. On: 08/21/2019 08:09        Scheduled Meds: . atorvastatin  40 mg Oral Daily  . docusate sodium  100 mg Oral BID  . fluticasone  1 spray Each Nare Daily  . gabapentin  300 mg Oral BID  . heparin  5,000 Units Subcutaneous Q8H  . isosorbide mononitrate  60 mg Oral Daily  . loratadine  10 mg Oral Daily  . montelukast  10 mg Oral QHS  . pantoprazole  40 mg Oral Daily  . predniSONE  5 mg Oral BID WC  . sodium chloride flush  3  mL Intravenous Once  . sodium chloride flush  3 mL Intravenous Q12H  . sodium chloride flush  3 mL Intravenous Q12H   Continuous Infusions: . sodium chloride 100 mL/hr at 08/22/19 0303  . sodium chloride       LOS: 0 days    Time spent:40 min    Iran Rowe, Geraldo Docker, MD [facility] Pager [phone]  If 7PM-7AM, please contact night-coverage [email or web address] Password [facility] 08/22/2019, 12:27 PM

## 2019-08-22 NOTE — Progress Notes (Signed)
PT Cancellation Note  Patient Details Name: Phyllip Itzkowitz MRN: [id number] DOB: [birth year]   Cancelled Treatment:    Reason Eval/Treat Not Completed: Medical issues which prohibited therapy, troponin still trending up.  Will attempt again another day.    Reginia Naas 08/22/2019, 1:34 PM  Magda Kiel, Ali Molina [phone] 08/22/2019

## 2019-08-22 NOTE — Progress Notes (Signed)
Progress Note  Patient Name: Jonathan [name] Date of Encounter: 08/22/2019  Primary Cardiologist:   No primary care provider on file.   Subjective   Mild chest pain.  Much improved from yesterday.  No SOB.   Inpatient Medications    Scheduled Meds: . atorvastatin  40 mg Oral q1800  . docusate sodium  100 mg Oral BID  . fluticasone  1 spray Each Nare Daily  . gabapentin  300 mg Oral BID  . heparin  5,000 Units Subcutaneous Q8H  . loratadine  10 mg Oral Daily  . montelukast  10 mg Oral QHS  . nitroGLYCERIN  0.5 inch Topical Q6H  . pantoprazole  40 mg Oral Daily  . predniSONE  5 mg Oral BID WC  . sodium chloride flush  3 mL Intravenous Once  . sodium chloride flush  3 mL Intravenous Q12H  . sodium chloride flush  3 mL Intravenous Q12H   Continuous Infusions: . sodium chloride 100 mL/hr at 08/22/19 0303  . sodium chloride     PRN Meds: sodium chloride, acetaminophen, bisacodyl, cyclobenzaprine, morphine injection, ondansetron **OR** ondansetron (ZOFRAN) IV, oxyCODONE, polyethylene glycol, sodium chloride flush, traMADol   Vital Signs    Vitals:   08/22/19 0011 08/22/19 0017 08/22/19 0301 08/22/19 0730  BP: 123/67  104/76 129/81  Pulse: 72  79 (!) 102  Resp: 20  18 17   Temp: 98.6 F (37 C)  98.5 F (36.9 C) 99.6 F (37.6 C)  TempSrc: Oral  Oral Oral  SpO2: 99%  98% 98%  Weight:  66.7 kg    Height:        Intake/Output Summary (Last 24 hours) at 08/22/2019 0758 Last data filed at 08/22/2019 0600 Gross per 24 hour  Intake 2128.28 ml  Output 525 ml  Net 1603.28 ml   Filed Weights   08/21/19 0813 08/21/19 0816 08/22/19 0017  Weight: 71.7 kg 72.6 kg 66.7 kg    Telemetry    NSR - Personally Reviewed  ECG    NA - Personally Reviewed  Physical Exam   GEN: No acute distress.   Neck: No  JVD Cardiac: RRR, no murmurs, rubs, or gallops.  Respiratory: Clear  to auscultation bilaterally. GI: Soft, nontender, non-distended  MS: No  edema; No deformity.  Left femoral without bleeding or bruising Neuro:  Nonfocal  Psych: Normal affect   Labs    Chemistry Recent Labs  Lab 08/21/19 0601 08/21/19 1712 08/22/19 0350  NA 136  --  135  K 3.9  --  3.9  CL 100  --  104  CO2 23  --  22  GLUCOSE 130*  --  115*  BUN 11  --  8  CREATININE 0.68 0.61 0.64  CALCIUM 10.3  --  9.4  PROT 8.7*  --  6.8  ALBUMIN 3.7  --  2.5*  AST 61*  --  196*  ALT 25  --  30  ALKPHOS 104  --  85  BILITOT 0.7  --  0.6  GFRNONAA >60 >60 >60  GFRAA >60 >60 >60  ANIONGAP 13  --  9     Hematology Recent Labs  Lab 08/21/19 0601 08/21/19 1712 08/22/19 0350  WBC 5.9 5.0 7.2  RBC 4.12* 3.64* 3.44*  HGB 10.3* 9.1* 8.8*  HCT 33.7* 29.1* 27.6*  MCV 81.8 79.9* 80.2  MCH 25.0* 25.0* 25.6*  MCHC 30.6 31.3 31.9  RDW 13.9 13.7 13.8  PLT 329 267 283    Cardiac EnzymesNo  results for input(s): TROPONINI in the last 168 hours. No results for input(s): TROPIPOC in the last 168 hours.   BNP Recent Labs  Lab 08/21/19 1712  BNP 87.3     DDimer  Recent Labs  Lab 08/21/19 0814  DDIMER 9.10*     Radiology    DG Chest 2 View  Result Date: 08/21/2019 CLINICAL DATA:  Chest pain EXAM: CHEST - 2 VIEW COMPARISON:  None. FINDINGS: The heart size and mediastinal contours are within normal limits. Both lungs are clear. The visualized skeletal structures are unremarkable. IMPRESSION: No active cardiopulmonary disease. Electronically Signed   By: Prudencio Pair M.D.   On: 08/21/2019 06:20   CT Angio Chest PE W and/or Wo Contrast  Result Date: 08/21/2019 CLINICAL DATA:  Midline lower back pain.  Positive D-dimer EXAM: CT ANGIOGRAPHY CHEST WITH CONTRAST TECHNIQUE: Multidetector CT imaging of the chest was performed using the standard protocol during bolus administration of intravenous contrast. Multiplanar CT image reconstructions and MIPs were obtained to evaluate the vascular anatomy. CONTRAST:  15mL OMNIPAQUE IOHEXOL 350 MG/ML SOLN COMPARISON:  02/10/2014 FINDINGS:  Cardiovascular: No filling defects within the pulmonary arteries to suggest acute pulmonary embolism. Mediastinum/Nodes: No axillary or supraclavicular adenopathy. No mediastinal or hilar lymphadenopathy. No pericardial effusion. Esophagus Lungs/Pleura: No pulmonary infarction. 4 mm nodule in the RIGHT upper lobe (image 56/6) compares to 3 mm on CT 2015. Subpleural nodules in the RIGHT upper lobe (image 45/series 6 and image 37/series 6) are unchanged. No new pulmonary nodules present. Upper Abdomen: Limited view of the liver, kidneys, pancreas are unremarkable. Normal adrenal glands. Musculoskeletal: No aggressive osseous lesion. Endplate osteophytosis present. Review of the MIP images confirms the above findings. IMPRESSION: 1. No evidence acute pulmonary embolism. 2. No acute pulmonary parenchymal findings. 3. Stable bilateral pulmonary nodules. 4. No acute findings of the spine. Electronically Signed   By: Suzy Bouchard M.D.   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  Faint left to right collaterals are noted.  Widely patent left main  Luminal irregularities noted in the mid to distal circumflex.  No high-grade focal obstruction.  The and diagonals are large without evidence of focal significant obstruction.  Left ventriculography reveals inferior wall akinesis.  EF is 40 to 45%.  The EDP is normal.  Ongoing chest discomfort described as sharp and worse with breathing raising question of post infarct pericarditis. RECOMMENDATION:  After reviewing clinical data including electrocardiograms that demonstrate well-formed inferior lateral Q waves without acute ST-T wave change, the data suggests that infarction involving the inferior wall is not acute.  Q waves began forming 18 to 24 hours post infarction.  It would be unlikely that an acute infarct starting within the past 18 hours involving the inferior wall would not have ST-T  wave abnormality.  Discussed with Dr. Percival Spanish.  US Abdomen Limited RUQ  Addendum Date: 08/21/2019   ADDENDUM REPORT: 08/21/2019 08:55 EXAM: Right upper quadrant ultrasound examination. History: Right upper quadrant abdominal pain and chest pain. History of cholecystectomy. Gallbladder: Surgically absent. Echogenic structure in the gallbladder fossa could be due to surgical clips. Common bile duct: 8.0 mm Liver: Normal echogenicity. No intrahepatic biliary dilatation. Small echogenic lesion noted in the right lobe peripherally measuring 1.2 x 1.1 cm. This is most likely a benign hepatic hemangioma. Normal directional flow in the portal vein. Other: Simple right renal cysts are noted. IMPRESSION: 1. Status post cholecystectomy.  Normal caliber  common bile duct. 2. Small echogenic lesion in the liver is likely a benign hemangioma. Not definitely identified on prior ultrasound or CT examinations. Recommend follow-up ultrasound examination and 4-6 months to document stability. Electronically Signed   By: Marijo Sanes M.D.   On: 08/21/2019 08:55   Result Date: 08/21/2019 : Please pick the correct US ABDOMEN LIMITED template. Electronically Signed: By: Marijo Sanes M.D. On: 08/21/2019 08:09    Cardiac Studies   Cath Diagnostic Dominance: Right    Patient Profile     60 y.o. 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 Kathrynn Humble.  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  Perhaps pain is related to demand ischemia given the fact that the trop was elevated.  Medical management of CAD.  Stopped NTG paste and added Imdur.  I will also add a low dose of Toprol XL.  Given the fact that this was an old MI not adding Plavix unless he has continued pain as an outpatient.    ISCHEMIC CARDIOMYOPATHY:    Start with low dose  of beta blocker and we will titrate as an out patient.  He seems to be euvolemic.    HTN:  BP is actually running low.     DYSLIPIDEMIA:    Started Lipitor 40 mg with goal LDL less than 70.  Will need follow up lipid and liver in 8 weeks.   ANEMIA:  Work up per primary team.  No historical evidence of active bleeding.  OK for ASA.    ELEVATED AST:  Per primary team.  I would not assume this was cardiac.   We will arrange two week TOC follow up.  OK to discharge from my standpoint.    For questions or updates, please contact [facility] Please consult [email or web address] for contact info under Cardiology/STEMI.   Signed, Minus Breeding, MD  08/22/2019, 7:58 AM

## 2019-08-22 NOTE — Progress Notes (Signed)
  Echocardiogram 2D Echocardiogram has been performed.  Jonathan [name] 08/22/2019, 10:16 AM

## 2019-08-23 LAB — BASIC METABOLIC PANEL
Anion gap: 5 (ref 5–15)
BUN: 7 mg/dL (ref 6–20)
CO2: 24 mmol/L (ref 22–32)
Calcium: 9.2 mg/dL (ref 8.9–10.3)
Chloride: 106 mmol/L (ref 98–111)
Creatinine, Ser: 0.58 mg/dL — ABNORMAL LOW (ref 0.61–1.24)
GFR calc Af Amer: >60 mL/min (ref >60)
GFR calc non Af Amer: >60 mL/min (ref >60)
Glucose, Bld: 119 mg/dL — ABNORMAL HIGH (ref 70–99)
Potassium: 3.4 mmol/L — ABNORMAL LOW (ref 3.5–5.1)
Sodium: 135 mmol/L (ref 135–145)

## 2019-08-23 LAB — IRON AND TIBC
Iron: 19 ug/dL — ABNORMAL LOW (ref 45–182)
Saturation Ratios: 13 % — ABNORMAL LOW (ref 17.9–39.5)
TIBC: 150 ug/dL — ABNORMAL LOW (ref 250–450)
UIBC: 131 ug/dL

## 2019-08-23 LAB — CBC
HCT: 25.6 % — ABNORMAL LOW (ref 39.0–52.0)
Hemoglobin: 8.2 g/dL — ABNORMAL LOW (ref 13.0–17.0)
MCH: 25.3 pg — ABNORMAL LOW (ref 26.0–34.0)
MCHC: 32 g/dL (ref 30.0–36.0)
MCV: 79 fL — ABNORMAL LOW (ref 80.0–100.0)
Platelets: 250 10*3/uL (ref 150–400)
RBC: 3.24 MIL/uL — ABNORMAL LOW (ref 4.22–5.81)
RDW: 13.7 % (ref 11.5–15.5)
WBC: 11.7 10*3/uL — ABNORMAL HIGH (ref 4.0–10.5)
nRBC: 0 % (ref 0.0–0.2)

## 2019-08-23 LAB — RETICULOCYTES
Immature Retic Fract: 14.8 % (ref 2.3–15.9)
RBC.: 3.23 MIL/uL — ABNORMAL LOW (ref 4.22–5.81)
Retic Count, Absolute: 44.3 10*3/uL (ref 19.0–186.0)
Retic Ct Pct: 1.4 % (ref 0.4–3.1)

## 2019-08-23 LAB — MAGNESIUM: Magnesium: 1.6 mg/dL — ABNORMAL LOW (ref 1.7–2.4)

## 2019-08-23 LAB — FERRITIN: Ferritin: 952 ng/mL — ABNORMAL HIGH (ref 24–336)

## 2019-08-23 LAB — VITAMIN B12: Vitamin B-12: 261 pg/mL (ref 180–914)

## 2019-08-23 LAB — PHOSPHORUS: Phosphorus: 2.5 mg/dL (ref 2.5–4.6)

## 2019-08-23 LAB — FOLATE: Folate: 7.7 ng/mL (ref >5.9)

## 2019-08-23 MED ORDER — POTASSIUM CHLORIDE CRYS ER 10 MEQ PO TBCR
10.0000 meq | EXTENDED_RELEASE_TABLET | Freq: Once | ORAL | Status: AC
  Administered 2019-08-23: 10:00:00 10 meq via ORAL
  Filled 2019-08-23: qty 1

## 2019-08-23 MED ORDER — MAGNESIUM SULFATE 2 GM/50ML IV SOLN
2.0000 g | Freq: Once | INTRAVENOUS | Status: AC
  Administered 2019-08-23: 10:00:00 2 g via INTRAVENOUS
  Filled 2019-08-23: qty 50

## 2019-08-23 MED ORDER — ASPIRIN 81 MG PO CHEW
81.0000 mg | CHEWABLE_TABLET | Freq: Once | ORAL | Status: AC
  Administered 2019-08-23: 10:00:00 81 mg via ORAL
  Filled 2019-08-23: qty 1

## 2019-08-23 MED ORDER — POTASSIUM CHLORIDE CRYS ER 20 MEQ PO TBCR
40.0000 meq | EXTENDED_RELEASE_TABLET | Freq: Once | ORAL | Status: AC
  Administered 2019-08-23: 10:00:00 40 meq via ORAL
  Filled 2019-08-23: qty 2

## 2019-08-23 NOTE — Evaluation (Signed)
Occupational Therapy Evaluation Patient Details Name: Jonathan [name] MRN: [id number] DOB: Jun 04, 1960 Today's Date: 08/23/2019    History of Present Illness 59 y.o. 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 Pt with hx of RA which limits ROM and strength for activity. Pt lives with his cousin who assist him with IADLs and takes care of home mgt. Pt has an aide 7 days/wk 2x/day for assist getting OOB in the morning, UB/LB bathing and dressing with occasional assist with clothing mgt during toileting. All education completed and no further acute OT is indicated at this time     Follow Up Recommendations  No OT follow up;Supervision/Assistance - 24 hour    Equipment Recommendations  None recommended by OT    Recommendations for Other Services       Precautions / Restrictions Precautions Precautions: Fall Restrictions Weight Bearing Restrictions: Yes Other Position/Activity Restrictions: has B Darco shoes      Mobility Bed Mobility Overal bed mobility: Needs Assistance Bed Mobility: Supine to Sit     Supine to sit: Min assist;+2 for physical assistance        Transfers Overall transfer level: Needs assistance Equipment used: 2 person hand held assist;None(crutches) Transfers: Sit to/from Stand Sit to Stand: Mod assist;+2 physical assistance         General transfer comment: Pt ambulated to sink using crutches , stood at sink for assisted UB and LB bathing total A. Pt able to wash face with min A for R elbow ROM    Balance Overall balance assessment: Needs assistance Sitting-balance support: Bilateral upper extremity supported;Feet supported Sitting balance-Leahy Scale: Fair     Standing balance support: Bilateral upper extremity  supported Standing balance-Leahy Scale: Poor Standing balance comment: Pt ambulated to sink using crutches , stood at sink for assisted UB and LB bathing total A utilizing counter top to steady/balance himself. Pt able to wash face with min A for R elbow ROM                           ADL either performed or assessed with clinical judgement   ADL                                         General ADL Comments: Pt requires max - total A at baseline due to AROM impaired by RA. Pt reports thats he feeds himslef with long handle utensils due to B elbow AROM impairments     Vision Baseline Vision/History: Wears glasses Wears Glasses: At all times Patient Visual Report: No change from baseline       Perception     Praxis      Pertinent Vitals/Pain Pain Assessment: 0-10 Pain Score: 6  Pain Location: bilateral LE with dependent position  Pain Descriptors / Indicators: Sore;Aching Pain Intervention(s): Limited activity within patient's tolerance;Monitored during session;Repositioned     Hand Dominance Right   Extremity/Trunk Assessment Upper Extremity Assessment Upper Extremity Assessment: Defer to OT evaluation   Lower Extremity Assessment Lower Extremity Assessment: Defer to PT evaluation       Communication Communication Communication: No difficulties  Cognition Arousal/Alertness: Awake/alert Behavior During Therapy: WFL for tasks assessed/performed Overall Cognitive Status: Within Functional Limits for tasks assessed                                     General Comments       Exercises     Shoulder Instructions      Home Living Family/patient expects to be discharged to:: Private residence Living Arrangements: Other relatives Available Help at Discharge: Family;Other (Comment)(cousin, also has aide 7 days/wk morning and evening for ADLs) Type of Home: House Home Access: Level entry     Home Layout: Multi-level;Able to  live on main level with bedroom/bathroom     Bathroom Shower/Tub: Teacher, early years/pre: Standard Bathroom Accessibility: Yes   Home Equipment: Walker - 2 wheels;Crutches;Bedside commode;Hospital bed          Prior Functioning/Environment Level of Independence: Needs assistance  Gait / Transfers Assistance Needed: ambulates household distances with crutches ADL's / Homemaking Assistance Needed: aide comes 2x/day for bathing/dressing, getting tn/out of bed, utilizes longer utensils and stands for eating, cousin provides ADLs/ iADLs            OT Problem List: Decreased strength;Impaired balance (sitting and/or standing);Pain;Decreased range of motion;Decreased activity tolerance;Decreased safety awareness;Decreased knowledge of use of DME or AE      OT Treatment/Interventions:      OT Goals(Current goals can be found in the care plan section) Acute Rehab OT Goals Patient Stated Goal: go home OT Goal Formulation: With patient  OT Frequency:     Barriers to D/C:            Co-evaluation              AM-PAC OT "6 Clicks" Daily Activity     Outcome Measure Help from another person eating meals?: None Help from another person taking care of personal grooming?: A Little Help from another person toileting, which includes using toliet, bedpan, or urinal?: A Lot Help from another person bathing (including washing, rinsing, drying)?: Total Help from another person to put on and taking off regular upper body clothing?: Total Help from another person to put on and taking off regular lower body clothing?: Total 6 Click Score: 12   End of Session Equipment Utilized During Treatment: Gait belt;Other (comment)(crutches) Nurse Communication: Mobility status  Activity Tolerance: Patient tolerated treatment well Patient left: in chair;with call bell/phone within reach;with chair alarm set  OT Visit Diagnosis: Unsteadiness on feet (R26.81);Other abnormalities of  gait and mobility (R26.89);Muscle weakness (generalized) (M62.81);Pain Pain - Right/Left: (bilaterally) Pain - part of body: Ankle and joints of foot;Leg                Time: [id number] OT Time Calculation (min): 35 min Charges:  OT General Charges $OT Visit: 1 Visit OT Evaluation $OT Eval Moderate Complexity: 1 Mod    Britt Bottom 08/23/2019, 11:45 AM

## 2019-08-23 NOTE — Evaluation (Signed)
Physical Therapy Evaluation Patient Details Name: Jonathan [name] MRN: [id number] DOB: 1960-10-30 Today's Date: 08/23/2019   History of Present Illness  59 y.o. 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 Pt has aide who assists him 2x/day 7day/wk with getting in and out of bed, bathing and dressing. Pt is limited in safe mobility by longstanding RA which limits his ROM, however pt has developed many compensatory strategies to maintain a higher level of independence. Pt is likely close to his baseline level of function, requiring minAx2 for bed mobility, modAx2 for tranfers and minAx2 for ambulation of 8 feet using crutches. PT recommending HHPT to regain strength and endurance in his home environment. PT will continue to follow acutely.     Follow Up Recommendations Home health PT;Supervision/Assistance - 24 hour    Equipment Recommendations  None recommended by PT       Precautions / Restrictions Precautions Precautions: Fall Restrictions Weight Bearing Restrictions: Yes Other Position/Activity Restrictions: has B Darco shoes      Mobility  Bed Mobility Overal bed mobility: Needs Assistance Bed Mobility: Supine to Sit     Supine to sit: Min assist;+2 for physical assistance     General bed mobility comments: pt able to progress LE off bed but requires min Ax2 for pulling trunk to upright and for pad scoot of hips to EoB   Transfers Overall transfer level: Needs assistance Equipment used: 2 person hand held assist;None(crutches) Transfers: Sit to/from Stand Sit to Stand: Mod assist;+2 physical assistance         General transfer comment: Pt ambulated to sink using crutches , stood at sink for assisted UB and LB bathing total A. Pt able  to wash face with min A for R elbow ROM  Ambulation/Gait Ambulation/Gait assistance: Mod assist;+2 physical assistance;+2 safety/equipment Gait Distance (Feet): 8 Feet Assistive device: Crutches Gait Pattern/deviations: Step-to pattern;Decreased step length - right;Decreased step length - left;Narrow base of support Gait velocity: slowed Gait velocity interpretation: <1.31 ft/sec, indicative of household ambulator General Gait Details: due to decreased UE ROM pt utilizes crutches for mobility, modA for steadying with slowed deliberate gait, no overt LoB        Balance Overall balance assessment: Needs assistance Sitting-balance support: Bilateral upper extremity supported;Feet supported Sitting balance-Leahy Scale: Fair     Standing balance support: Bilateral upper extremity supported Standing balance-Leahy Scale: Poor Standing balance comment: Pt ambulated to sink using crutches , stood at sink for assisted UB and LB bathing total A utilizing counter top to steady/balance himself. Pt able to wash face with min A for R elbow ROM                             Pertinent Vitals/Pain Pain Assessment: 0-10 Pain Score: 6  Pain Location: bilateral LE with dependent position  Pain Descriptors / Indicators: Sore;Aching Pain Intervention(s): Limited activity within patient's tolerance;Monitored during session;Repositioned    Home Living Family/patient expects to be discharged to:: Private residence Living Arrangements: Other relatives Available Help at Discharge: Family;Other (Comment)(cousin, also has aide 7 days/wk morning and evening for ADLs) Type of Home: House Home Access: Level entry     Home Layout: Multi-level;Able to live on main  level with bedroom/bathroom Home Equipment: Walker - 2 wheels;Crutches;Bedside commode;Hospital bed      Prior Function Level of Independence: Needs assistance   Gait / Transfers Assistance Needed: ambulates household distances with  crutches  ADL's / Homemaking Assistance Needed: aide comes 2x/day for bathing/dressing, getting tn/out of bed, utilizes longer utensils and stands for eating, cousin provides ADLs/ iADLs        Hand Dominance   Dominant Hand: Right    Extremity/Trunk Assessment   Upper Extremity Assessment Upper Extremity Assessment: Defer to OT evaluation    Lower Extremity Assessment Lower Extremity Assessment: RLE deficits/detail RLE Deficits / Details: TKA, decreased ROM at knee, decreased mobility at ankle due to Unna boot, hip strength grossly 3/5 RLE Coordination: decreased gross motor;decreased fine motor LLE Deficits / Details: TKA, decreased ROM at knee, decreased mobility at ankle due to Unna boot, hip strength grossly 3/5 LLE Coordination: decreased fine motor;decreased gross motor       Communication   Communication: No difficulties  Cognition Arousal/Alertness: Awake/alert Behavior During Therapy: WFL for tasks assessed/performed Overall Cognitive Status: Within Functional Limits for tasks assessed                                        General Comments General comments (skin integrity, edema, etc.): Pt with bilateral Unna boots that are clean, dry and intact, with bilateral Darco shoes        Assessment/Plan    PT Assessment Patient needs continued PT services  PT Problem List Decreased strength;Decreased range of motion;Decreased activity tolerance;Decreased balance;Decreased mobility;Decreased coordination;Pain       PT Treatment Interventions DME instruction;Gait training;Functional mobility training;Therapeutic activities;Therapeutic exercise;Balance training;Patient/family education    PT Goals (Current goals can be found in the Care Plan section)  Acute Rehab PT Goals Patient Stated Goal: go home PT Goal Formulation: With patient Time For Goal Achievement: 09/13/19 Potential to Achieve Goals: Fair    Frequency Min 3X/week    AM-PAC PT "6  Clicks" Mobility  Outcome Measure Help needed turning from your back to your side while in a flat bed without using bedrails?: None Help needed moving from lying on your back to sitting on the side of a flat bed without using bedrails?: A Lot Help needed moving to and from a bed to a chair (including a wheelchair)?: A Little Help needed standing up from a chair using your arms (e.g., wheelchair or bedside chair)?: A Lot Help needed to walk in hospital room?: A Lot Help needed climbing 3-5 steps with a railing? : Total 6 Click Score: 14    End of Session Equipment Utilized During Treatment: Gait belt Activity Tolerance: Patient tolerated treatment well Patient left: in chair;with chair alarm set;with call bell/phone within reach Nurse Communication: Mobility status;Weight bearing status PT Visit Diagnosis: Unsteadiness on feet (R26.81);Other abnormalities of gait and mobility (R26.89);Muscle weakness (generalized) (M62.81);Difficulty in walking, not elsewhere classified (R26.2);Pain    Time: [id number] PT Time Calculation (min) (ACUTE ONLY): 36 min   Charges:   PT Evaluation $PT Eval Moderate Complexity: 1 Mod          Myrtice Lowdermilk B. Migdalia Dk PT, DPT Acute Rehabilitation Services Pager [phone] Office [phone]   [place] 08/23/2019, 12:16 PM

## 2019-08-23 NOTE — Progress Notes (Signed)
CSW following for PT/OT recs to determine appropriate discharge plan.   [place], [place]

## 2019-08-23 NOTE — TOC Initial Note (Addendum)
Transition of Care [facility]) - Initial/Assessment Note    Patient Details  Name: Jonathan [name] MRN: [id number] Date of Birth: 07-08-1960  Transition of Care [facility]) CM/SW Contact:    Zenon Mayo, RN Phone Number: 08/23/2019, 3:21 PM  Clinical Narrative:                 Patient from home with cousin, he has aide 2x/week for 7 days, pt eval rec HHPT, NCM offered choice from medicare. gov list, patient states he does not have a preference,  NCM made referral to [facility] with [facility] for [facility] [place]. He is able to take referral. Soc will begin 24 to 48 hrs post dc. Patient also states he needs 3 n 1, referral given to [facility] with [employer] . He will bring up to patient room.   4/30- NCM received call from [place] with [employer], he states patient received a bsc on 04/2016 so he is not eligible for another one unles he wants to pay 75.00 for one because insurance will not pay for another that is not more than 59 years old. NCM informed patient he states he does not want to pay for another one, so he is good.   Expected Discharge Plan: [place] Barriers to Discharge: Continued Medical Work up   Patient Goals and CMS Choice Patient states their goals for this hospitalization and ongoing recovery are:: get better CMS Medicare.gov Compare Post Acute Care list provided to:: Patient Choice offered to / list presented to : Patient  Expected Discharge Plan and Services Expected Discharge Plan: [place]   Discharge Planning Services: CM Consult Post Acute Care Choice: [place] arrangements for the past 2 months: Single Family Home                 DME Arranged: 3-N-1 DME Agency: [employer] Date DME Agency Contacted: 08/23/19 Time DME Agency Contacted: 1520 Representative spoke with at DME Agency: zach HH Arranged: RN, Disease Management, PT [place] Agency: [place] Date [place]: 08/23/19 Time [place]: 61 Representative  spoke with at [place]: [place] Arrangements/Services Living arrangements for the past 2 months: [place] Lives with:: Relatives Patient language and need for interpreter reviewed:: Yes Do you feel safe going back to the place where you live?: Yes      Need for Family Participation in Patient Care: Yes (Comment) Care giver support system in place?: Yes (comment) Current home services: Other (comment)(aide 2 times a week for 7 days to help with dressing and bathing) Criminal Activity/Legal Involvement Pertinent to Current Situation/Hospitalization: No - Comment as needed  Activities of Daily Living Home Assistive Devices/Equipment: Eyeglasses, Crutches, Other (Comment)(single point cane, cam boot on left foot and walking shoe on left) ADL Screening (condition at time of admission) Patient's cognitive ability adequate to safely complete daily activities?: Yes Is the patient deaf or have difficulty hearing?: No Does the patient have difficulty seeing, even when wearing glasses/contacts?: No Does the patient have difficulty concentrating, remembering, or making decisions?: No Patient able to express need for assistance with ADLs?: Yes Does the patient have difficulty dressing or bathing?: No Independently performs ADLs?: Yes (appropriate for developmental age) Does the patient have difficulty walking or climbing stairs?: Yes Weakness of Legs: Both Weakness of Arms/Hands: None  Permission Sought/Granted                  Emotional Assessment Appearance:: Appears stated  age Attitude/Demeanor/Rapport: Engaged Affect (typically observed): Appropriate Orientation: : Oriented to  Time, Oriented to Place, Oriented to Self, Oriented to Situation Alcohol / Substance Use: Not Applicable Psych Involvement: No (comment)  Admission diagnosis:  NSTEMI (non-ST elevated myocardial infarction) ([place]) [I21.4] Abdominal pain [R10.9] Patient Active Problem List   Diagnosis  Date Noted  . Acute systolic CHF (congestive heart failure) ([place]) 08/22/2019  . Chronic pain syndrome 08/22/2019  . Rheumatoid arthritis ([place]) 08/22/2019  . Venous stasis ulcers ([place]) 08/22/2019  . Anemia, unspecified 08/22/2019  . NSTEMI (non-ST elevated myocardial infarction) ([place]) 08/21/2019  . Hyperglycemia 12/27/2018  . Cellulitis 12/27/2018  . Wound drainage 12/22/2018  . Foot ulcer ([place]) 12/21/2018  . Encounter for screening for HIV 12/21/2018  . Arthritis 08/22/2018  . HLD (hyperlipidemia) 08/22/2018  . Lung nodule 08/22/2018  . Renal cyst, right 08/22/2018  . Rheumatoid arthritis involving multiple sites with positive rheumatoid factor ([place]) 08/22/2018  . Venous insufficiency 08/22/2018  . Vitamin D deficiency 08/22/2018  . Seasonal allergic rhinitis 07/16/2018  . Essential hypertension 02/23/2018  . Insomnia 02/23/2018  . Tinea versicolor 02/23/2018  . Antalgic gait 09/21/2017  . Chronic pain of both knees 09/21/2017  . Foot pain, bilateral 09/21/2017  . Elevated liver enzymes 07/10/2017  . Leg edema 06/08/2017  . Primary osteoarthritis, left ankle and foot 05/09/2017  . Post-traumatic osteoarthritis, right ankle and foot 05/09/2017  . Primary osteoarthritis, right ankle and foot 05/09/2017  . Erectile dysfunction 02/15/2017  . Excessive sweating 02/15/2017  . Difficulty transferring 01/27/2017  . Acquired bilateral flat feet 11/15/2016  . Other acquired hammer toe 11/15/2016  . Erosive gastritis 09/05/2016  . At high risk for falls 08/15/2016  . Chronic bilateral low back pain without sciatica 10/13/2015  . Drug therapy 08/03/2015  . Rheumatoid arthritis involving left hip ([place]) 01/29/2015  . 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 Status post total replacement of right hip 11/11/2014  . Gait difficulty 08/21/2013  . Benign neoplasm of colon 04/03/2013  . Reflux esophagitis  04/03/2013  . Microcytic anemia 04/01/2013  . Chronic ulcer of right leg ([place]) 01/13/2013  . Pressure ulcer 01/13/2013  . 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 Degenerative arthritis of knee 06/07/2011   PCP:  Sinclair Ship, MD Pharmacy:   [id number] - [place], [address] [place] [address] Phone: [phone] Fax: [phone]     Social Determinants of Health (SDOH) Interventions    Readmission Risk Interventions No flowsheet data found.

## 2019-08-23 NOTE — Progress Notes (Signed)
Progress Note  Patient Name: Jonathan [name] Date of Encounter: 08/23/2019  Primary Cardiologist:   No primary care provider on file.   Subjective   He says his pain is gone.  Denies SOB  Inpatient Medications    Scheduled Meds:  atorvastatin  40 mg Oral Daily   docusate sodium  100 mg Oral BID   fluticasone  1 spray Each Nare Daily   gabapentin  300 mg Oral BID   heparin  5,000 Units Subcutaneous Q8H   isosorbide mononitrate  60 mg Oral Daily   loratadine  10 mg Oral Daily   montelukast  10 mg Oral QHS   pantoprazole  40 mg Oral Daily   predniSONE  5 mg Oral BID WC   sodium chloride flush  3 mL Intravenous Once   sodium chloride flush  3 mL Intravenous Q12H   sodium chloride flush  3 mL Intravenous Q12H   Continuous Infusions:  sodium chloride 100 mL/hr at 08/23/19 0806   sodium chloride     PRN Meds: sodium chloride, acetaminophen, bisacodyl, cyclobenzaprine, ondansetron **OR** ondansetron (ZOFRAN) IV, oxyCODONE, polyethylene glycol, sodium chloride flush, traMADol   Vital Signs    Vitals:   08/22/19 2347 08/23/19 0253 08/23/19 0606 08/23/19 0810  BP: 108/62  112/82 103/64  Pulse: 77  81 76  Resp: 19  18   Temp: 98.6 F (37 C)  98 F (36.7 C)   TempSrc: Oral  Oral   SpO2: 98%  100%   Weight:  67.7 kg    Height:        Intake/Output Summary (Last 24 hours) at 08/23/2019 0819 Last data filed at 08/23/2019 0657 Gross per 24 hour  Intake 2494.81 ml  Output 400 ml  Net 2094.81 ml   Filed Weights   08/21/19 0816 08/22/19 0017 08/23/19 0253  Weight: 72.6 kg 66.7 kg 67.7 kg    Telemetry    NSR - Personally Reviewed  ECG    NA - Personally Reviewed  Physical Exam   GEN: No  acute distress.   Neck: No  JVD Cardiac: RRR, no murmurs, rubs, or gallops.  Respiratory: Clear   to auscultation bilaterally. GI: Soft, nontender, non-distended, normal bowel sounds  MS:  No edema; No deformity. Neuro:   Nonfocal  Psych: Oriented and  appropriate    Labs    Chemistry Recent Labs  Lab 08/21/19 0601 08/21/19 0601 08/21/19 1712 08/22/19 0350 08/23/19 0454  NA 136  --   --  135 135  K 3.9  --   --  3.9 3.4*  CL 100  --   --  104 106  CO2 23  --   --  22 24  GLUCOSE 130*  --   --  115* 119*  BUN 11  --   --  8 7  CREATININE 0.68   < > 0.61 0.64 0.58*  CALCIUM 10.3  --   --  9.4 9.2  PROT 8.7*  --   --  6.8  --   ALBUMIN 3.7  --   --  2.5*  --   AST 61*  --   --  196*  --   ALT 25  --   --  30  --   ALKPHOS 104  --   --  85  --   BILITOT 0.7  --   --  0.6  --   GFRNONAA >60   < > >60 >60 >60  GFRAA >60   < > >  60 >60 >60  ANIONGAP 13  --   --  9 5   < > = values in this interval not displayed.     Hematology Recent Labs  Lab 08/21/19 1712 08/21/19 1712 08/22/19 0350 08/23/19 0454  WBC 5.0  --  7.2 11.7*  RBC 3.64*   < > 3.44* 3.24*   3.23*  HGB 9.1*  --  8.8* 8.2*  HCT 29.1*  --  27.6* 25.6*  MCV 79.9*  --  80.2 79.0*  MCH 25.0*  --  25.6* 25.3*  MCHC 31.3  --  31.9 32.0  RDW 13.7  --  13.8 13.7  PLT 267  --  283 250   < > = values in this interval not displayed.    Cardiac EnzymesNo results for input(s): TROPONINI in the last 168 hours. No results for input(s): TROPIPOC in the last 168 hours.   BNP Recent Labs  Lab 08/21/19 1712  BNP 87.3     DDimer  Recent Labs  Lab 08/21/19 0814  DDIMER 9.10*     Radiology    CT Angio Chest PE W and/or Wo Contrast  Result Date: 08/21/2019 CLINICAL DATA:  Midline lower back pain.  Positive D-dimer EXAM: CT ANGIOGRAPHY CHEST WITH CONTRAST TECHNIQUE: Multidetector CT imaging of the chest was performed using the standard protocol during bolus administration of intravenous contrast. Multiplanar CT image reconstructions and MIPs were obtained to evaluate the vascular anatomy. CONTRAST:  138mL OMNIPAQUE IOHEXOL 350 MG/ML SOLN COMPARISON:  02/10/2014 FINDINGS: Cardiovascular: No filling defects within the pulmonary arteries to suggest acute pulmonary  embolism. Mediastinum/Nodes: No axillary or supraclavicular adenopathy. No mediastinal or hilar lymphadenopathy. No pericardial effusion. Esophagus Lungs/Pleura: No pulmonary infarction. 4 mm nodule in the RIGHT upper lobe (image 56/6) compares to 3 mm on CT 2015. Subpleural nodules in the RIGHT upper lobe (image 45/series 6 and image 37/series 6) are unchanged. No new pulmonary nodules present. Upper Abdomen: Limited view of the liver, kidneys, pancreas are unremarkable. Normal adrenal glands. Musculoskeletal: No aggressive osseous lesion. Endplate osteophytosis present. Review of the MIP images confirms the above findings. IMPRESSION: 1. No evidence acute pulmonary embolism. 2. No acute pulmonary parenchymal findings. 3. Stable bilateral pulmonary nodules. 4. No acute findings of the spine. Electronically Signed   By: Suzy Bouchard M.D.   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  Faint left to right collaterals are noted.  Widely patent left main  Luminal irregularities noted in the mid to distal circumflex.  No high-grade focal obstruction.  The and diagonals are large without evidence of focal significant obstruction.  Left ventriculography reveals inferior wall akinesis.  EF is 40 to 45%.  The EDP is normal.  Ongoing chest discomfort described as sharp and worse with breathing raising question of post infarct pericarditis. RECOMMENDATION:  After reviewing clinical data including electrocardiograms that demonstrate well-formed inferior lateral Q waves without acute ST-T wave change, the data suggests that infarction involving the inferior wall is not acute.  Q waves began forming 18 to 24 hours post infarction.  It would be unlikely that an acute infarct starting within the past 18 hours involving the inferior wall would not have ST-T wave abnormality.  Discussed with Dr. Percival Spanish.  ECHOCARDIOGRAM COMPLETE  Result Date:  08/22/2019    ECHOCARDIOGRAM REPORT   [name] Date of Exam: 08/22/2019 Medical Rec #:  [id number]  Height:       63.0 in Accession #:    [id number]       Weight:       147.0 lb Date of Birth:  1961-02-03       BSA:          1.697 m Patient Age:    59 years         BP:           129/81 mmHg Patient Gender: M                HR:           89 bpm. Exam Location:  Inpatient Procedure: 2D Echo, Cardiac Doppler and Color Doppler Indications:    R07.9* Chest pain, unspecified. Elevated troponin.  History:        Patient has no prior history of Echocardiogram examinations.                 Acute MI, Abnormal ECG, Signs/Symptoms:Chest Pain; Risk                 Factors:Hypertension.  Sonographer:    Roseanna Rainbow RDCS Referring Phys: [id number] [place]  1. Mild inferior hypokinesis. Left ventricular ejection fraction, by estimation, is 50 to 55%. The left ventricle has low normal function. The left ventricle demonstrates regional wall motion abnormalities (see scoring diagram/findings for description).  There is mild concentric left ventricular hypertrophy. Left ventricular diastolic parameters are consistent with Grade I diastolic dysfunction (impaired relaxation).  2. Right ventricular systolic function is normal. The right ventricular size is normal. There is normal pulmonary artery systolic pressure.  3. The mitral valve is normal in structure. Trivial mitral valve regurgitation. No evidence of mitral stenosis.  4. The aortic valve is tricuspid. Aortic valve regurgitation is not visualized. No aortic stenosis is present.  5. The inferior vena cava is normal in size with greater than 50% respiratory variability, suggesting right atrial pressure of 3 mmHg. FINDINGS  Left Ventricle: Mild inferior hypokinesis. Left ventricular ejection fraction, by estimation, is 50 to 55%. The left ventricle has low normal function. The left ventricle demonstrates regional wall motion abnormalities.  The left ventricular internal cavity size was normal in size. There is mild concentric left ventricular hypertrophy. Left ventricular diastolic parameters are consistent with Grade I diastolic dysfunction (impaired relaxation). Indeterminate filling pressures. Right Ventricle: The right ventricular size is normal. No increase in right ventricular wall thickness. Right ventricular systolic function is normal. There is normal pulmonary artery systolic pressure. The tricuspid regurgitant velocity is 1.07 m/s, and  with an assumed right atrial pressure of 3 mmHg, the estimated right ventricular systolic pressure is 7.6 mmHg. Left Atrium: Left atrial size was normal in size. Right Atrium: Right atrial size was normal in size. Pericardium: There is no evidence of pericardial effusion. Mitral Valve: The mitral valve is normal in structure. Normal mobility of the mitral valve leaflets. Trivial mitral valve regurgitation. No evidence of mitral valve stenosis. Tricuspid Valve: The tricuspid valve is normal in structure. Tricuspid valve regurgitation is trivial. No evidence of tricuspid stenosis. Aortic Valve: The aortic valve is tricuspid. Aortic valve regurgitation is not visualized. No aortic stenosis is present. Pulmonic Valve: The pulmonic valve was normal in structure. Pulmonic valve regurgitation is not visualized. No evidence of pulmonic stenosis. Aorta: The aortic root is normal in size and structure. Venous: The inferior vena cava is normal in size with greater than 50% respiratory variability, suggesting right atrial pressure  of 3 mmHg. IAS/Shunts: No atrial level shunt detected by color flow Doppler.  LEFT VENTRICLE PLAX 2D LVIDd:         4.12 cm      Diastology LVIDs:         2.92 cm      LV e' lateral:   13.10 cm/s LV PW:         1.23 cm      LV E/e' lateral: 6.4 LV IVS:        1.13 cm      LV e' medial:    8.27 cm/s LVOT diam:     2.20 cm      LV E/e' medial:  10.2 LV SV:         92 LV SV Index:   54 LVOT Area:      3.80 cm  LV Volumes (MOD) LV vol d, MOD A2C: 143.0 ml LV vol d, MOD A4C: 107.0 ml LV vol s, MOD A2C: 67.0 ml LV vol s, MOD A4C: 52.7 ml LV SV MOD A2C:     76.0 ml LV SV MOD A4C:     107.0 ml LV SV MOD BP:      64.1 ml RIGHT VENTRICLE             IVC RV S prime:     15.20 cm/s  IVC diam: 1.63 cm TAPSE (M-mode): 2.1 cm LEFT ATRIUM             Index       RIGHT ATRIUM           Index LA diam:        1.90 cm 1.12 cm/m  RA Area:     10.10 cm LA Vol (A2C):   35.4 ml 20.86 ml/m RA Volume:   19.70 ml  11.61 ml/m LA Vol (A4C):   22.2 ml 13.08 ml/m LA Biplane Vol: 30.6 ml 18.03 ml/m  AORTIC VALVE LVOT Vmax:   124.00 cm/s LVOT Vmean:  91.600 cm/s LVOT VTI:    0.242 m  AORTA Ao Root diam: 3.50 cm MITRAL VALVE                TRICUSPID VALVE MV Area (PHT): 3.37 cm     TR Peak grad:   4.6 mmHg MV Decel Time: 225 msec     TR Vmax:        107.00 cm/s MV E velocity: 84.20 cm/s MV A velocity: 111.00 cm/s  SHUNTS MV E/A ratio:  0.76         Systemic VTI:  0.24 m                             Systemic Diam: 2.20 cm Skeet Latch MD Electronically signed by Skeet Latch MD Signature Date/Time: 08/22/2019/1:08:38 PM    Final     Cardiac Studies   Cath Diagnostic Dominance: Right   IMPRESSIONS   1. Mild inferior hypokinesis. Left ventricular ejection fraction, by  estimation, is 50 to 55%. The left ventricle has low normal function. The  left ventricle demonstrates regional wall motion abnormalities (see  scoring diagram/findings for description).  There is mild concentric left ventricular hypertrophy. Left ventricular  diastolic parameters are consistent with Grade I diastolic dysfunction  (impaired relaxation).  2. Right ventricular systolic function is normal. The right ventricular  size is normal. There is normal pulmonary artery systolic pressure.  3. The mitral valve is  normal in structure. Trivial mitral valve  regurgitation. No evidence of mitral stenosis.  4. The aortic valve is  tricuspid. Aortic valve regurgitation is not  visualized. No aortic stenosis is present.  5. The inferior vena cava is normal in size with greater than 50%  respiratory variability, suggesting right atrial pressure of 3 mmHg.   Patient Profile     59 y.o. 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 Kathrynn Humble.  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  Medical management.  Added Imdur.   EF is preserved overall.  Unable to add beta blocker with low BP.  Started ASA.   HTN:   BP is actually running low.     DYSLIPIDEMIA:  Continue Lipitor  HYPOKALEMIA:  Supplemented this AM.  [facility] will sign off.   Medication Recommendations:  Meds as on MAR Other recommendations (labs, testing, etc):  Needs BMET at follow up Follow up as an outpatient:  Has appt in our office on 5/12     For questions or updates, please contact [place] Please consult [email or web address] for contact info under Cardiology/STEMI.   Signed, Minus Breeding, MD  08/23/2019, 8:19 AM

## 2019-08-23 NOTE — Progress Notes (Signed)
PROGRESS NOTE    Jonathan [name]  [id number] DOB: 18-Apr-1961 DOA: 08/21/2019 PCP: Jonathan [name]     Brief Narrative:  59 y.o. BM PMHx rheumatoid arthritis, polyclonal gammopathy, vitamin B12 deficiency, hyperlipidemia, chronic venous insufficiency with bilateral lower extremity Unna boots and ulcers and chronic pain   Presented to hospital with generalized body pain chest pain and back pain getting worse for Jonathan last 1 week.  Patient denies any increasing dyspnea, diaphoresis palpitation.  No mention of fever, cough, chills or rigor.  No urinary urgency, frequency or dysuria.  No nausea, abdominal pain but had one episode of vomiting today.  Patient usually ambulates with Jonathan help of Jonathan crutches at home and lives with his cousin.  Patient complained of generalized tenderness over his joints.  He also complains of back pain with radiation to Jonathan legs.  He is on prednisone and Mobic as outpatient and is supposed to get infliximab every 6 weeks.  Patient complains of bilateral lower extremity swelling and is on Jonathan [name].   ED Course: In Jonathan ED, patient complains of diffuse pain.  He remained hemodynamically stable.  EKG had Q waves in inferior leads. Troponin was elevated significantly.  First set of troponin was 1717 followed by 3343.  D-dimer was also elevated at 9.1.  hemoglobin of 10.3.  Creatinine 0.6.  Platelets within normal limits.  CT angiogram of Jonathan chest was planned for elevated d dimer.  Covid test was negative.  Cardiology was consulted and patient was considered for admission to Jonathan hospital. Patient was given aspirin and heparin drip in Jonathan ED.    Subjective: A/O x4, negative S OB, negative abdominal pain.  Reproducible chest pain 3/10 over left breast with with palpation.   Assessment & Plan:   Principal Problem:   NSTEMI (non-ST elevated myocardial infarction) ([place]) Active Problems:   Arthritis   At high risk for falls   Essential hypertension   Acute systolic  CHF (congestive heart failure) (HCC)   Chronic pain syndrome   Rheumatoid arthritis (HCC)   Venous stasis ulcers (HCC)   Anemia, unspecified  NSTEMI/Acute Systolic CHF Results for Jonathan [name] (MRN [id number]) as of 08/22/2019 10:45  Ref. Range 08/21/2019 06:01 08/21/2019 08:14  Troponin I (High Sensitivity) Latest Ref Range: <18 ng/L 1,717 [facility]) 3,343 [facility])  -4/28 s/p cardiac catheterization EF 40 to 45% see results below -Imdur 60 mg daily -Strict in and out +1.6 L -Daily weight Filed Weights   08/21/19 0816 08/22/19 0017 08/23/19 0253  Weight: 72.6 kg 66.7 kg 67.7 kg   Essential HTN -See CHF  Elevated D-dimer.   -CT angiogram without pulmonary embolism.  Rheumatoid arthritis/Chronic Pain syndrome,  -Chronic pain uses crutches for ambulation, ambulatory difficulty.   -Flexeril 5 mg PRN -Gabapentin 300 mg BID -Oxycodone 5 -10 mg PRN -Prednisone 5 mg BID -Tramadol 50 mg PRN  Bilateral lower extremity chronic venous insufficiency -Per patient has been going on for approximately 1 year.  Nurse has been coming out to his cousin's house to change dressings -Continue Unna boot  Anemia chronic disease. -Anemia consistent with chronic disease.   -Occult blood pending -Transfuse for hemoglobin<7  Hypokalemia -Potassium goal> 4 -K-Dur 50 meq  Hypomagnesmia -Magnesium goal> 2 -Magnesium IV 2 g    Goals of care -4/29 PT/OT consult patient was residing at cousin's house.  Patient very debilitated evaluate for SNF vs assisted living   DVT prophylaxis: Subcu heparin Code Status: Full Family Communication:  Disposition Plan: TBD 1.  Where  Jonathan patient is from 2.  Anticipated d/c place. 3.  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  Faint left to right collaterals are noted. -Widely patent left mainLuminal irregularities noted in  Jonathan mid to distal circumflex.  No high-grade focal obstruction. -Jonathan and diagonals are large without evidence of focal significant obstruction.  -Left ventriculography reveals inferior wall akinesis.  EF is 40 to 45%.   -AFter reviewing clinical data including electrocardiograms that demonstrate well-formed inferior lateral Q waves without acute ST-T wave change, Jonathan data suggests that infarction involving Jonathan inferior wall is not acute.   -Q waves began forming 18 to 24 hours post infarction.   4/29 Echocardiogram; Left Ventricle:-Mild inferior hypokinesis. LVEF= 50 to 55%.  -Concentric LVH -Grade I diastolic dysfunction (impaired relaxation).  4/30 cardiology signed off  I have personally reviewed and interpreted all radiology studies and my findings are as above.  VENTILATOR SETTINGS:    Cultures   Antimicrobials:    Devices    LINES / TUBES:      Continuous Infusions: . sodium chloride 100 mL/hr at 08/23/19 0806  . sodium chloride       Objective: Vitals:   08/22/19 2347 08/23/19 0253 08/23/19 0606 08/23/19 0810  BP: 108/62  112/82 103/64  Pulse: 77  81 76  Resp: 19  18   Temp: 98.6 F (37 C)  98 F (36.7 C)   TempSrc: Oral  Oral   SpO2: 98%  100%   Weight:  67.7 kg    Height:        Intake/Output Summary (Last 24 hours) at 08/23/2019 0914 Last data filed at 08/23/2019 [id number] Gross per 24 hour  Intake 2614.81 ml  Output 400 ml  Net 2214.81 ml   Filed Weights   08/21/19 0816 08/22/19 0017 08/23/19 0253  Weight: 72.6 kg 66.7 kg 67.7 kg    Examination:  General:A/O x4, no acute respiratory distress, cachectic Eyes: negative scleral hemorrhage, negative anisocoria, negative icterus ENT: Negative Runny nose, negative gingival bleeding, Neck:  Negative scars, masses, torticollis, lymphadenopathy, JVD Lungs: Clear to auscultation bilaterally without wheezes or crackles Cardiovascular: Regular rate and rhythm without murmur gallop or rub normal S1 and  S2 Abdomen: negative abdominal pain, nondistended, positive soft, bowel sounds, no rebound, no ascites, no appreciable mass Extremities: Bilateral lower extremities Unna boots present did not remove. Skin: Negative rashes, lesions, ulcers Psychiatric:  Negative depression, negative anxiety, negative fatigue, negative mania  Central nervous system:  Cranial nerves II through XII intact, tongue/uvula midline, all extremities muscle strength 5/5, sensation intact throughout, negative dysarthria, negative expressive aphasia, negative receptive aphasia.  .     Data Reviewed: Care during Jonathan described time interval was provided by me .  I have reviewed this patient's available data, including medical history, events of note, physical examination, and all test results as part of my evaluation.  CBC: Recent Labs  Lab 08/21/19 0601 08/21/19 1712 08/22/19 0350 08/23/19 0454  WBC 5.9 5.0 7.2 11.7*  HGB 10.3* 9.1* 8.8* 8.2*  HCT 33.7* 29.1* 27.6* 25.6*  MCV 81.8 79.9* 80.2 79.0*  PLT 329 267 283 [id number]   Basic Metabolic Panel: Recent Labs  Lab 08/21/19 0601 08/21/19 1712 08/22/19 0350 08/23/19 0454  NA 136  --  135 135  K 3.9  --  3.9 3.4*  CL 100  --  104 106  CO2  23  --  22 24  GLUCOSE 130*  --  115* 119*  BUN 11  --  8 7  CREATININE 0.68 0.61 0.64 0.58*  CALCIUM 10.3  --  9.4 9.2  MG  --   --   --  1.6*  PHOS  --   --   --  2.5   GFR: Estimated Creatinine Clearance: 81 mL/min (A) (by C-G formula based on SCr of 0.58 mg/dL (L)). Liver Function Tests: Recent Labs  Lab 08/21/19 0601 08/22/19 0350  AST 61* 196*  ALT 25 30  ALKPHOS 104 85  BILITOT 0.7 0.6  PROT 8.7* 6.8  ALBUMIN 3.7 2.5*   Recent Labs  Lab 08/21/19 0601  LIPASE 37   No results for input(s): AMMONIA in Jonathan last 168 hours. Coagulation Profile: Recent Labs  Lab 08/21/19 0814  INR 1.1   Cardiac Enzymes: No results for input(s): CKTOTAL, CKMB, CKMBINDEX, TROPONINI in Jonathan last 168 hours. BNP (last 3  results) No results for input(s): PROBNP in Jonathan last 8760 hours. HbA1C: Recent Labs    08/21/19 1712  HGBA1C 5.7*   CBG: No results for input(s): GLUCAP in Jonathan last 168 hours. Lipid Profile: Recent Labs    08/21/19 1712  CHOL 175  HDL 47  LDLCALC 113*  TRIG 75  CHOLHDL 3.7   Thyroid Function Tests: Recent Labs    08/21/19 1712  TSH 1.646   Anemia Panel: Recent Labs    08/23/19 0454  VITAMINB12 261  FOLATE 7.7  FERRITIN 952*  TIBC 150*  IRON 19*  RETICCTPCT 1.4   Sepsis Labs: No results for input(s): PROCALCITON, LATICACIDVEN in Jonathan last 168 hours.  Recent Results (from Jonathan past 240 hour(s))  Respiratory Panel by RT PCR (Flu A&B, Covid) - Nasopharyngeal Swab     Status: None   Collection Time: 08/21/19  7:27 AM   Specimen: Nasopharyngeal Swab  Result Value Ref Range Status   SARS Coronavirus 2 by RT PCR NEGATIVE NEGATIVE Final    Comment: (NOTE) SARS-CoV-2 target nucleic acids are NOT DETECTED. Jonathan SARS-CoV-2 RNA is generally detectable in upper respiratoy specimens during Jonathan acute phase of infection. Jonathan lowest concentration of SARS-CoV-2 viral copies this assay can detect is 131 copies/mL. A negative result does not preclude SARS-Cov-2 infection and should not be used as Jonathan sole basis for treatment or other patient management decisions. A negative result may occur with  improper specimen collection/handling, submission of specimen other than nasopharyngeal swab, presence of viral mutation(s) within Jonathan areas targeted by this assay, and inadequate number of viral copies (<131 copies/mL). A negative result must be combined with clinical observations, patient history, and epidemiological information. Jonathan expected result is Negative. Fact Sheet for Patients:  PinkCheek.be Fact Sheet for Healthcare Providers:  GravelBags.it This test is not yet ap proved or cleared by [place] FDA and  has  been authorized for detection and/or diagnosis of SARS-CoV-2 by FDA under an Emergency Use Authorization (EUA). This EUA will remain  in effect (meaning this test can be used) for Jonathan duration of Jonathan COVID-19 declaration under Section 564(b)(1) of Jonathan Act, 21 U.S.C. section 360bbb-3(b)(1), unless Jonathan authorization is terminated or revoked sooner.    Influenza A by PCR NEGATIVE NEGATIVE Final   Influenza B by PCR NEGATIVE NEGATIVE Final    Comment: (NOTE) Jonathan Xpert Xpress SARS-CoV-2/FLU/RSV assay is intended as an aid in  Jonathan diagnosis of influenza from Nasopharyngeal swab specimens and  should not be used as  a sole basis for treatment. Nasal washings and  aspirates are unacceptable for Xpert Xpress SARS-CoV-2/FLU/RSV  testing. Fact Sheet for Patients: PinkCheek.be Fact Sheet for Healthcare Providers: GravelBags.it This test is not yet approved or cleared by [place] FDA and  has been authorized for detection and/or diagnosis of SARS-CoV-2 by  FDA under an Emergency Use Authorization (EUA). This EUA will remain  in effect (meaning this test can be used) for Jonathan duration of Jonathan  Covid-19 declaration under Section 564(b)(1) of Jonathan Act, 21  U.S.C. section 360bbb-3(b)(1), unless Jonathan authorization is  terminated or revoked. Performed at [facility], [place] [address] [place], [place] [address]          Radiology Studies: CT Angio Chest PE W and/or Wo Contrast  Result Date: 08/21/2019 CLINICAL DATA:  Midline lower back pain.  Positive D-dimer EXAM: CT ANGIOGRAPHY CHEST WITH CONTRAST TECHNIQUE: Multidetector CT imaging of Jonathan chest was performed using Jonathan standard protocol during bolus administration of intravenous contrast. Multiplanar CT image reconstructions and MIPs were obtained to evaluate Jonathan vascular anatomy. CONTRAST:  161mL OMNIPAQUE IOHEXOL 350 MG/ML SOLN COMPARISON:  02/10/2014 FINDINGS:  Cardiovascular: No filling defects within Jonathan pulmonary arteries to suggest acute pulmonary embolism. Mediastinum/Nodes: No axillary or supraclavicular adenopathy. No mediastinal or hilar lymphadenopathy. No pericardial effusion. Esophagus Lungs/Pleura: No pulmonary infarction. 4 mm nodule in Jonathan RIGHT upper lobe (image 56/6) compares to 3 mm on CT 2015. Subpleural nodules in Jonathan RIGHT upper lobe (image 45/series 6 and image 37/series 6) are unchanged. No new pulmonary nodules present. Upper Abdomen: Limited view of Jonathan liver, kidneys, pancreas are unremarkable. Normal adrenal glands. Musculoskeletal: No aggressive osseous lesion. Endplate osteophytosis present. Review of Jonathan MIP images confirms Jonathan above findings. IMPRESSION: 1. No evidence acute pulmonary embolism. 2. No acute pulmonary parenchymal findings. 3. Stable bilateral pulmonary nodules. 4. No acute findings of Jonathan spine. Electronically Signed   By: Suzy Bouchard M.D.   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  Faint left to right collaterals are noted.  Widely patent left main  Luminal irregularities noted in Jonathan mid to distal circumflex.  No high-grade focal obstruction.  Jonathan and diagonals are large without evidence of focal significant obstruction.  Left ventriculography reveals inferior wall akinesis.  EF is 40 to 45%.  Jonathan EDP is normal.  Ongoing chest discomfort described as sharp and worse with breathing raising question of post infarct pericarditis. RECOMMENDATION:  After reviewing clinical data including electrocardiograms that demonstrate well-formed inferior lateral Q waves without acute ST-T wave change, Jonathan data suggests that infarction involving Jonathan inferior wall is not acute.  Q waves began forming 18 to 24 hours post infarction.  It would be unlikely that an acute infarct starting within Jonathan past 18 hours involving Jonathan inferior wall would not have ST-T  wave abnormality.  Discussed with Dr. Percival Spanish.  ECHOCARDIOGRAM COMPLETE  Result Date: 08/22/2019    ECHOCARDIOGRAM REPORT   Patient Name:   SEAMUS IVIE Date of Exam: 08/22/2019 Medical Rec #:  [id number]        Height:       63.0 in Accession #:    [id number]       Weight:       147.0 lb Date of Birth:  10-22-1960       BSA:          1.697 m Patient Age:    19  years         BP:           129/81 mmHg Patient Gender: M                HR:           89 bpm. Exam Location:  Inpatient Procedure: 2D Echo, Cardiac Doppler and Color Doppler Indications:    R07.9* Chest pain, unspecified. Elevated troponin.  History:        Patient has no prior history of Echocardiogram examinations.                 Acute MI, Abnormal ECG, Signs/Symptoms:Chest Pain; Risk                 Factors:Hypertension.  Sonographer:    Roseanna Rainbow RDCS Referring Phys: [id number] [place]  1. Mild inferior hypokinesis. Left ventricular ejection fraction, by estimation, is 50 to 55%. Jonathan left ventricle has low normal function. Jonathan left ventricle demonstrates regional wall motion abnormalities (see scoring diagram/findings for description).  There is mild concentric left ventricular hypertrophy. Left ventricular diastolic parameters are consistent with Grade I diastolic dysfunction (impaired relaxation).  2. Right ventricular systolic function is normal. Jonathan right ventricular size is normal. There is normal pulmonary artery systolic pressure.  3. Jonathan mitral valve is normal in structure. Trivial mitral valve regurgitation. No evidence of mitral stenosis.  4. Jonathan aortic valve is tricuspid. Aortic valve regurgitation is not visualized. No aortic stenosis is present.  5. Jonathan inferior vena cava is normal in size with greater than 50% respiratory variability, suggesting right atrial pressure of 3 mmHg. FINDINGS  Left Ventricle: Mild inferior hypokinesis. Left ventricular ejection fraction, by estimation, is 50 to 55%. Jonathan left ventricle  has low normal function. Jonathan left ventricle demonstrates regional wall motion abnormalities. Jonathan left ventricular internal cavity size was normal in size. There is mild concentric left ventricular hypertrophy. Left ventricular diastolic parameters are consistent with Grade I diastolic dysfunction (impaired relaxation). Indeterminate filling pressures. Right Ventricle: Jonathan right ventricular size is normal. No increase in right ventricular wall thickness. Right ventricular systolic function is normal. There is normal pulmonary artery systolic pressure. Jonathan tricuspid regurgitant velocity is 1.07 m/s, and  with an assumed right atrial pressure of 3 mmHg, Jonathan estimated right ventricular systolic pressure is 7.6 mmHg. Left Atrium: Left atrial size was normal in size. Right Atrium: Right atrial size was normal in size. Pericardium: There is no evidence of pericardial effusion. Mitral Valve: Jonathan mitral valve is normal in structure. Normal mobility of Jonathan mitral valve leaflets. Trivial mitral valve regurgitation. No evidence of mitral valve stenosis. Tricuspid Valve: Jonathan tricuspid valve is normal in structure. Tricuspid valve regurgitation is trivial. No evidence of tricuspid stenosis. Aortic Valve: Jonathan aortic valve is tricuspid. Aortic valve regurgitation is not visualized. No aortic stenosis is present. Pulmonic Valve: Jonathan pulmonic valve was normal in structure. Pulmonic valve regurgitation is not visualized. No evidence of pulmonic stenosis. Aorta: Jonathan aortic root is normal in size and structure. Venous: Jonathan inferior vena cava is normal in size with greater than 50% respiratory variability, suggesting right atrial pressure of 3 mmHg. IAS/Shunts: No atrial level shunt detected by color flow Doppler.  LEFT VENTRICLE PLAX 2D LVIDd:         4.12 cm      Diastology LVIDs:         2.92 cm      LV e' lateral:   13.10 cm/s LV  PW:         1.23 cm      LV E/e' lateral: 6.4 LV IVS:        1.13 cm      LV e' medial:    8.27 cm/s LVOT  diam:     2.20 cm      LV E/e' medial:  10.2 LV SV:         92 LV SV Index:   54 LVOT Area:     3.80 cm  LV Volumes (MOD) LV vol d, MOD A2C: 143.0 ml LV vol d, MOD A4C: 107.0 ml LV vol s, MOD A2C: 67.0 ml LV vol s, MOD A4C: 52.7 ml LV SV MOD A2C:     76.0 ml LV SV MOD A4C:     107.0 ml LV SV MOD BP:      64.1 ml RIGHT VENTRICLE             IVC RV S prime:     15.20 cm/s  IVC diam: 1.63 cm TAPSE (M-mode): 2.1 cm LEFT ATRIUM             Index       RIGHT ATRIUM           Index LA diam:        1.90 cm 1.12 cm/m  RA Area:     10.10 cm LA Vol (A2C):   35.4 ml 20.86 ml/m RA Volume:   19.70 ml  11.61 ml/m LA Vol (A4C):   22.2 ml 13.08 ml/m LA Biplane Vol: 30.6 ml 18.03 ml/m  AORTIC VALVE LVOT Vmax:   124.00 cm/s LVOT Vmean:  91.600 cm/s LVOT VTI:    0.242 m  AORTA Ao Root diam: 3.50 cm MITRAL VALVE                TRICUSPID VALVE MV Area (PHT): 3.37 cm     TR Peak grad:   4.6 mmHg MV Decel Time: 225 msec     TR Vmax:        107.00 cm/s MV E velocity: 84.20 cm/s MV A velocity: 111.00 cm/s  SHUNTS MV E/A ratio:  0.76         Systemic VTI:  0.24 m                             Systemic Diam: 2.20 cm Skeet Latch [name] Electronically signed by Skeet Latch [name] Signature Date/Time: 08/22/2019/1:08:38 PM    Final         Scheduled Meds: . aspirin  81 mg Oral Once  . atorvastatin  40 mg Oral Daily  . docusate sodium  100 mg Oral BID  . fluticasone  1 spray Each Nare Daily  . gabapentin  300 mg Oral BID  . heparin  5,000 Units Subcutaneous Q8H  . isosorbide mononitrate  60 mg Oral Daily  . loratadine  10 mg Oral Daily  . montelukast  10 mg Oral QHS  . pantoprazole  40 mg Oral Daily  . potassium chloride  40 mEq Oral Once  . predniSONE  5 mg Oral BID WC  . sodium chloride flush  3 mL Intravenous Once  . sodium chloride flush  3 mL Intravenous Q12H  . sodium chloride flush  3 mL Intravenous Q12H   Continuous Infusions: . sodium chloride 100 mL/hr at 08/23/19 0806  . sodium chloride       LOS: 1  day    Time spent:40 min    Bralon Antkowiak, Geraldo Docker, [name] Pager [phone]  If 7PM-7AM, please contact night-coverage [email or web address] Password [facility] 08/23/2019, 9:14 AM

## 2019-08-24 DIAGNOSIS — I83013 Varicose veins of right lower extremity with ulcer of ankle: Secondary | ICD-10-CM

## 2019-08-24 DIAGNOSIS — M1990 Unspecified osteoarthritis, unspecified site: Secondary | ICD-10-CM

## 2019-08-24 DIAGNOSIS — L97319 Non-pressure chronic ulcer of right ankle with unspecified severity: Secondary | ICD-10-CM

## 2019-08-24 DIAGNOSIS — M05712 Rheumatoid arthritis with rheumatoid factor of left shoulder without organ or systems involvement: Secondary | ICD-10-CM

## 2019-08-24 DIAGNOSIS — I5022 Chronic systolic (congestive) heart failure: Secondary | ICD-10-CM

## 2019-08-24 HISTORY — DX: Chronic systolic (congestive) heart failure: I50.22

## 2019-08-24 LAB — CBC
HCT: 26.9 % — ABNORMAL LOW (ref 39.0–52.0)
Hemoglobin: 8.5 g/dL — ABNORMAL LOW (ref 13.0–17.0)
MCH: 24.9 pg — ABNORMAL LOW (ref 26.0–34.0)
MCHC: 31.6 g/dL (ref 30.0–36.0)
MCV: 78.9 fL — ABNORMAL LOW (ref 80.0–100.0)
Platelets: 290 10*3/uL (ref 150–400)
RBC: 3.41 MIL/uL — ABNORMAL LOW (ref 4.22–5.81)
RDW: 14 % (ref 11.5–15.5)
WBC: 11.9 10*3/uL — ABNORMAL HIGH (ref 4.0–10.5)
nRBC: 0 % (ref 0.0–0.2)

## 2019-08-24 LAB — MAGNESIUM: Magnesium: 1.7 mg/dL (ref 1.7–2.4)

## 2019-08-24 LAB — BASIC METABOLIC PANEL
Anion gap: 9 (ref 5–15)
BUN: 7 mg/dL (ref 6–20)
CO2: 22 mmol/L (ref 22–32)
Calcium: 9.5 mg/dL (ref 8.9–10.3)
Chloride: 105 mmol/L (ref 98–111)
Creatinine, Ser: 0.63 mg/dL (ref 0.61–1.24)
GFR calc Af Amer: >60 mL/min (ref >60)
GFR calc non Af Amer: >60 mL/min (ref >60)
Glucose, Bld: 107 mg/dL — ABNORMAL HIGH (ref 70–99)
Potassium: 4 mmol/L (ref 3.5–5.1)
Sodium: 136 mmol/L (ref 135–145)

## 2019-08-24 LAB — PHOSPHORUS: Phosphorus: 1.9 mg/dL — ABNORMAL LOW (ref 2.5–4.6)

## 2019-08-24 MED ORDER — DOCUSATE SODIUM 100 MG PO CAPS: 100.0000 mg | ORAL_CAPSULE | Freq: Two times a day (BID) | ORAL | 0 refills | Status: AC

## 2019-08-24 MED ORDER — ACETAMINOPHEN 325 MG PO TABS: 650.0000 mg | ORAL_TABLET | ORAL | 0 refills | Status: AC | PRN

## 2019-08-24 MED ORDER — ONDANSETRON HCL 4 MG PO TABS: 4.0000 mg | ORAL_TABLET | Freq: Four times a day (QID) | ORAL | 0 refills | Status: DC | PRN

## 2019-08-24 MED ORDER — TRAMADOL HCL 50 MG PO TABS: 50.0000 mg | ORAL_TABLET | Freq: Three times a day (TID) | ORAL | 0 refills | Status: DC | PRN

## 2019-08-24 MED ORDER — ISOSORBIDE MONONITRATE ER 60 MG PO TB24: 60.0000 mg | ORAL_TABLET | Freq: Every day | ORAL | 0 refills | Status: DC

## 2019-08-24 MED ORDER — OXYCODONE HCL 5 MG PO TABS: 5.0000 mg | ORAL_TABLET | ORAL | 0 refills | Status: DC | PRN

## 2019-08-24 MED ORDER — MAGNESIUM SULFATE 50 % IJ SOLN
3.0000 g | Freq: Once | INTRAVENOUS | Status: AC
  Administered 2019-08-24: 3 g via INTRAVENOUS
  Filled 2019-08-24: qty 6

## 2019-08-24 MED ORDER — SODIUM PHOSPHATES 45 MMOLE/15ML IV SOLN
30.0000 mmol | Freq: Once | INTRAVENOUS | Status: AC
  Administered 2019-08-24: 30 mmol via INTRAVENOUS
  Filled 2019-08-24: qty 10

## 2019-08-24 NOTE — TOC Transition Note (Signed)
Transition of Care [facility]) - CM/SW Discharge Note   Patient Details  Name: Jonathan [name] MRN: [id number] Date of Birth: Oct 25, 1960  [facility]) CM/SW Contact:  Bartholomew Crews, RN Phone Number: [phone] 08/24/2019, 3:14 PM   Clinical Narrative:     Notified by nursing that patient now stating that he does not have a ride home and is asking to stay another day. Spoke with patient at the bedside to discuss cab ride home and can he get into house. Assisted patient with making a couple phone calls. Patient able to connect with family who can pick patient up. Patient states that he is not ready for pick up until about 7pm. Verified with nurse that patient will complete his sodium bicarb infusion about 6:30.    Final next level of care: [place] Barriers to Discharge: No Barriers Identified   Patient Goals and CMS Choice Patient states their goals for this hospitalization and ongoing recovery are:: get better CMS Medicare.gov Compare Post Acute Care list provided to:: Patient Choice offered to / list presented to : Patient  Discharge Placement                       Discharge Plan and Services   Discharge Planning Services: CM Consult Post Acute Care Choice: Home Health          DME Arranged: N/A DME Agency: NA Date DME Agency Contacted: 08/23/19 Time DME Agency Contacted: 1520 Representative spoke with at DME Agency: zach HH Arranged: PT, Nurse's Aide, Social Work, Therapist, sports HH Agency: [place] Date [place]: 08/24/19 Time [place]: 1228 Representative spoke with at [place]: [place] ([place]) Interventions     Readmission Risk Interventions No flowsheet data found.

## 2019-08-24 NOTE — Progress Notes (Signed)
Discharge education and medication education given to patient with teach back. Education on increasing activity slowlyand when to call MD given. All questions and concerns answered. Peripheral IV and telemetry leads removed. All patient belongings given to patient. Patient currently awaiting ride for home.

## 2019-08-24 NOTE — TOC Transition Note (Signed)
Transition of Care [facility]) - CM/SW Discharge Note   Patient Details  Name: Jonathan [name] MRN: [id number] Date of Birth: [birth year]  [facility]) CM/SW Contact:  Bartholomew Crews, RN Phone Number: [phone] 08/24/2019, 12:28 PM   Clinical Narrative:     Patient to transition home today. Noted previous NCM had referred patient to [facility]. Liaison notified of discharge today. [place] orders placed by MD. Patient states that he has transportation home. No further TOC needs identified.   Final next level of care: [place] Barriers to Discharge: No Barriers Identified   Patient Goals and CMS Choice Patient states their goals for this hospitalization and ongoing recovery are:: get better CMS Medicare.gov Compare Post Acute Care list provided to:: Patient Choice offered to / list presented to : Patient  Discharge Placement                       Discharge Plan and Services   Discharge Planning Services: CM Consult Post Acute Care Choice: Home Health          DME Arranged: N/A DME Agency: NA Date DME Agency Contacted: 08/23/19 Time DME Agency Contacted: 1520 Representative spoke with at DME Agency: zach HH Arranged: PT, Nurse's Aide, Social Work, Therapist, sports HH Agency: [place] Date [place]: 08/24/19 Time [place]: 1228 Representative spoke with at [place]: [place] ([place]) Interventions     Readmission Risk Interventions No flowsheet data found.

## 2019-08-24 NOTE — Progress Notes (Addendum)
Patient states he cannot go home today because he does not have a ride home tonight and will not have help once he arrives home tonight, requesting to stay another night. Sherral Hammers, MD paged. Page returned from MD, RN notified case manager- states she will come to speak with patient.  ADDENDUM: Case manager came to bedside to speak with patient regarding discharge barriers. Patient states he does have transportation home now and will have a ride around 1900 or 1930.

## 2019-08-24 NOTE — Discharge Summary (Signed)
Physician Discharge Summary  Kamoni Demott [id number] DOB: Sep 28, 1960 DOA: 08/21/2019  PCP: Sinclair Ship, MD  Admit date: 08/21/2019 Discharge date: 08/24/2019  Time spent: 30 minutes  Recommendations for Outpatient Follow-up:   NSTEMI/Acute Systolic CHF -[id number] s/p cardiac catheterization EF 40 to 45% see results below -Imdur 60 mg daily -Strict in and out +1.6 L -Daily weight  Essential HTN -See CHF  Elevated D-dimer.  -CT angiogramwithout pulmonary embolism.  Rheumatoid arthritis/Chronic Pain syndrome, -Chronic pain uses crutches for ambulation,ambulatory difficulty.  -Flexeril 5 mg PRN -Gabapentin 300 mg BID -Oxycodone 5 -10 mg PRN -Prednisone 5 mg BID -Tramadol 50 mg PRN  Bilateral lower extremity chronic venous insufficiency -Per patient has been going on for approximately 1 year.  Nurse has been coming out to his cousin's house to change dressings -Continue Unna boot  Anemia chronic disease. -Anemia consistent with chronic disease.   -Occult blood pending -Transfuse for hemoglobin<7  Hypokalemia -Potassium goal> 4  Hypomagnesmia -Magnesium goal> 2 -Magnesium IV 3 g prior to discharge  Hypophosphatemia -Phosphorus goal> 2.5 -Sodium phosphate IV 30 mmol prior to discharge     Discharge Diagnoses:  Principal Problem:   NSTEMI (non-ST elevated myocardial infarction) [facility]) Active Problems:   Arthritis   At high risk for falls   Essential hypertension   Acute systolic CHF (congestive heart failure) (HCC)   Chronic pain syndrome   Rheumatoid arthritis (HCC)   Venous stasis ulcers (HCC)   Anemia, unspecified   Discharge Condition: Stable  Diet recommendation: Heart healthy  Filed Weights   08/22/19 0017 08/23/19 0253 08/24/19 0300  Weight: 66.7 kg 67.7 kg 67.7 kg    History of present illness:  59 y.o.BM PMHx rheumatoid arthritis,polyclonal gammopathy, vitamin B12 deficiency, hyperlipidemia, chronic venous insufficiency with  bilateral lower extremity Unna boots and ulcers and chronic pain   Presented to hospital with generalized body pain chest pain and back pain getting worse for the last 1 week. Patient denies any increasing dyspnea, diaphoresis palpitation. No mention of fever, cough, chills or rigor. No urinary urgency, frequency or dysuria. No nausea, abdominal pain but had one episode of vomiting today. Patient usually ambulates with the help of the crutches at home and lives with his cousin. Patient complainedof generalized tenderness over his joints. He also complains of back pain with radiation to the legs. He is on prednisone and Mobic as outpatient and is supposed to get infliximab every 6 weeks. Patient complains of bilateral lower extremity swelling and is on [employer].  ED Course:In the ED, patient complains of diffuse pain. He remained hemodynamically stable. EKG had Q waves in inferior leads.Troponin was elevated significantly.First set of troponin was 1717 followed by 3343. D-dimer was also elevated at 9.1. hemoglobin of 10.3. Creatinine 0.6. Platelets within normal limits. CT angiogram of the chest was planned for elevated d dimer. Covid test was negative. Cardiology was consulted and patient was considered for admission to the hospital. Patientwas given aspirin and heparin drip in the ED.  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 Faint left to right collaterals are noted. -Widely patent left mainLuminal irregularities noted in the mid to distal circumflex. No high-grade focal obstruction. -The and diagonals are large without evidence of focal significant obstruction.  -Left ventriculography reveals inferior wall akinesis. EF is 40 to 45%.  -AFter reviewing clinical data including electrocardiograms that demonstrate well-formed inferior lateral Q waves without acute ST-T wave change, the data suggests that  infarction  involving the inferior wall is not acute.  -Q waves began forming 18 to 24 hours post infarction.  4/29 Echocardiogram; Left Ventricle:-Mild inferior hypokinesis. LVEF= 50 to 55%.  -Concentric LVH -Grade I diastolic dysfunction (impaired relaxation).  4/30 cardiology signed off  Consultations: Cardiology    Cultures   4/28 SARS coronavirus negative    Antibiotics Anti-infectives (From admission, onward)   None        Discharge Exam: Vitals:   08/23/19 2027 08/24/19 0300 08/24/19 0414 08/24/19 0807  BP: 93/63  101/65 120/69  Pulse: 93  90 (!) 101  Resp: 18  20 15   Temp: 99.2 F (37.3 C)  98.8 F (37.1 C)   TempSrc: Oral  Oral   SpO2: 99%  100% 95%  Weight:  67.7 kg    Height:        General:A/O x4, no acute respiratory distress, cachectic Eyes: negative scleral hemorrhage, negative anisocoria, negative icterus ENT: Negative Runny nose, negative gingival bleeding, Neck:  Negative scars, masses, torticollis, lymphadenopathy, JVD Lungs: Clear to auscultation bilaterally without wheezes or crackles Cardiovascular: Regular rate and rhythm without murmur gallop or rub normal S1 and S2   Discharge Instructions   Allergies as of 08/24/2019   No Known Allergies     Medication List    TAKE these medications   acetaminophen 325 MG tablet Commonly known as: TYLENOL Take 2 tablets (650 mg total) by mouth every 4 (four) hours as needed for headache or mild pain.   atorvastatin 40 MG tablet Commonly known as: LIPITOR Take 40 mg by mouth daily.   cetirizine 10 MG tablet Commonly known as: ZYRTEC Take 10 mg by mouth daily.   cyclobenzaprine 5 MG tablet Commonly known as: FLEXERIL Take 5 mg by mouth every 8 (eight) hours as needed.   docusate sodium 100 MG capsule Commonly known as: COLACE Take 1 capsule (100 mg total) by mouth 2 (two) times daily.   fluticasone 50 MCG/ACT nasal spray Commonly known as: FLONASE Place 1 spray into the nose  daily.   gabapentin 300 MG capsule Commonly known as: NEURONTIN Take 300 mg by mouth 2 (two) times daily.   inFLIXimab 100 MG injection Commonly known as: REMICADE Inject 300 mg into the vein every 6 (six) weeks.   isosorbide mononitrate 60 MG 24 hr tablet Commonly known as: IMDUR Take 1 tablet (60 mg total) by mouth daily. Start taking on: Aug 25, 2019   meloxicam 15 MG tablet Commonly known as: MOBIC Take 15 mg by mouth daily.   montelukast 10 MG tablet Commonly known as: SINGULAIR Take 10 mg by mouth at bedtime.   omeprazole 20 MG capsule Commonly known as: PRILOSEC Take 20 mg by mouth daily.   ondansetron 4 MG tablet Commonly known as: ZOFRAN Take 1 tablet (4 mg total) by mouth every 6 (six) hours as needed for nausea.   oxyCODONE 5 MG immediate release tablet Commonly known as: Oxy IR/ROXICODONE Take 1-2 tablets (5-10 mg total) by mouth every 4 (four) hours as needed for moderate pain.   predniSONE 5 MG tablet Commonly known as: DELTASONE Take 5 mg by mouth 2 (two) times daily.   traMADol 50 MG tablet Commonly known as: ULTRAM Take 1 tablet (50 mg total) by mouth every 8 (eight) hours as needed (mild pain).      No Known Allergies Follow-up Information    [facility], [facility] Follow up.   Specialty: Home Health Services Why: HHPT, [place] information: [place]  [address] [place] [address] [phone]            The results of significant diagnostics from this hospitalization (including imaging, microbiology, ancillary and laboratory) are listed below for reference.    Significant Diagnostic Studies: DG Chest 2 View  Result Date: 08/21/2019 CLINICAL DATA:  Chest pain EXAM: CHEST - 2 VIEW COMPARISON:  None. FINDINGS: The heart size and mediastinal contours are within normal limits. Both lungs are clear. The visualized skeletal structures are unremarkable. IMPRESSION: No active cardiopulmonary disease. Electronically Signed   By:  Prudencio Pair M.D.   On: 08/21/2019 06:20   CT Angio Chest PE W and/or Wo Contrast  Result Date: 08/21/2019 CLINICAL DATA:  Midline lower back pain.  Positive D-dimer EXAM: CT ANGIOGRAPHY CHEST WITH CONTRAST TECHNIQUE: Multidetector CT imaging of the chest was performed using the standard protocol during bolus administration of intravenous contrast. Multiplanar CT image reconstructions and MIPs were obtained to evaluate the vascular anatomy. CONTRAST:  129mL OMNIPAQUE IOHEXOL 350 MG/ML SOLN COMPARISON:  02/10/2014 FINDINGS: Cardiovascular: No filling defects within the pulmonary arteries to suggest acute pulmonary embolism. Mediastinum/Nodes: No axillary or supraclavicular adenopathy. No mediastinal or hilar lymphadenopathy. No pericardial effusion. Esophagus Lungs/Pleura: No pulmonary infarction. 4 mm nodule in the RIGHT upper lobe (image 56/6) compares to 3 mm on CT 2015. Subpleural nodules in the RIGHT upper lobe (image 45/series 6 and image 37/series 6) are unchanged. No new pulmonary nodules present. Upper Abdomen: Limited view of the liver, kidneys, pancreas are unremarkable. Normal adrenal glands. Musculoskeletal: No aggressive osseous lesion. Endplate osteophytosis present. Review of the MIP images confirms the above findings. IMPRESSION: 1. No evidence acute pulmonary embolism. 2. No acute pulmonary parenchymal findings. 3. Stable bilateral pulmonary nodules. 4. No acute findings of the spine. Electronically Signed   By: Suzy Bouchard M.D.   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  Faint left to right collaterals are noted.  Widely patent left main  Luminal irregularities noted in the mid to distal circumflex.  No high-grade focal obstruction.  The and diagonals are large without evidence of focal significant obstruction.  Left ventriculography reveals inferior wall akinesis.  EF is 40 to 45%.  The EDP is normal.   Ongoing chest discomfort described as sharp and worse with breathing raising question of post infarct pericarditis. RECOMMENDATION:  After reviewing clinical data including electrocardiograms that demonstrate well-formed inferior lateral Q waves without acute ST-T wave change, the data suggests that infarction involving the inferior wall is not acute.  Q waves began forming 18 to 24 hours post infarction.  It would be unlikely that an acute infarct starting within the past 18 hours involving the inferior wall would not have ST-T wave abnormality.  Discussed with Dr. Percival Spanish.  ECHOCARDIOGRAM COMPLETE  Result Date: 08/22/2019    ECHOCARDIOGRAM REPORT   Patient Name:   ATHARV ASSIS Date of Exam: 08/22/2019 Medical Rec #:  [id number]        Height:       63.0 in Accession #:    [id number]       Weight:       147.0 lb Date of Birth:  July 22, 1960       BSA:          1.697 m Patient Age:    59 years         BP:           129/81  mmHg Patient Gender: M                HR:           89 bpm. Exam Location:  Inpatient Procedure: 2D Echo, Cardiac Doppler and Color Doppler Indications:    R07.9* Chest pain, unspecified. Elevated troponin.  History:        Patient has no prior history of Echocardiogram examinations.                 Acute MI, Abnormal ECG, Signs/Symptoms:Chest Pain; Risk                 Factors:Hypertension.  Sonographer:    Roseanna Rainbow RDCS Referring Phys: [id number] [place]  1. Mild inferior hypokinesis. Left ventricular ejection fraction, by estimation, is 50 to 55%. The left ventricle has low normal function. The left ventricle demonstrates regional wall motion abnormalities (see scoring diagram/findings for description).  There is mild concentric left ventricular hypertrophy. Left ventricular diastolic parameters are consistent with Grade I diastolic dysfunction (impaired relaxation).  2. Right ventricular systolic function is normal. The right ventricular size is normal. There is normal  pulmonary artery systolic pressure.  3. The mitral valve is normal in structure. Trivial mitral valve regurgitation. No evidence of mitral stenosis.  4. The aortic valve is tricuspid. Aortic valve regurgitation is not visualized. No aortic stenosis is present.  5. The inferior vena cava is normal in size with greater than 50% respiratory variability, suggesting right atrial pressure of 3 mmHg. FINDINGS  Left Ventricle: Mild inferior hypokinesis. Left ventricular ejection fraction, by estimation, is 50 to 55%. The left ventricle has low normal function. The left ventricle demonstrates regional wall motion abnormalities. The left ventricular internal cavity size was normal in size. There is mild concentric left ventricular hypertrophy. Left ventricular diastolic parameters are consistent with Grade I diastolic dysfunction (impaired relaxation). Indeterminate filling pressures. Right Ventricle: The right ventricular size is normal. No increase in right ventricular wall thickness. Right ventricular systolic function is normal. There is normal pulmonary artery systolic pressure. The tricuspid regurgitant velocity is 1.07 m/s, and  with an assumed right atrial pressure of 3 mmHg, the estimated right ventricular systolic pressure is 7.6 mmHg. Left Atrium: Left atrial size was normal in size. Right Atrium: Right atrial size was normal in size. Pericardium: There is no evidence of pericardial effusion. Mitral Valve: The mitral valve is normal in structure. Normal mobility of the mitral valve leaflets. Trivial mitral valve regurgitation. No evidence of mitral valve stenosis. Tricuspid Valve: The tricuspid valve is normal in structure. Tricuspid valve regurgitation is trivial. No evidence of tricuspid stenosis. Aortic Valve: The aortic valve is tricuspid. Aortic valve regurgitation is not visualized. No aortic stenosis is present. Pulmonic Valve: The pulmonic valve was normal in structure. Pulmonic valve regurgitation is not  visualized. No evidence of pulmonic stenosis. Aorta: The aortic root is normal in size and structure. Venous: The inferior vena cava is normal in size with greater than 50% respiratory variability, suggesting right atrial pressure of 3 mmHg. IAS/Shunts: No atrial level shunt detected by color flow Doppler.  LEFT VENTRICLE PLAX 2D LVIDd:         4.12 cm      Diastology LVIDs:         2.92 cm      LV e' lateral:   13.10 cm/s LV PW:         1.23 cm      LV E/e' lateral: 6.4  LV IVS:        1.13 cm      LV e' medial:    8.27 cm/s LVOT diam:     2.20 cm      LV E/e' medial:  10.2 LV SV:         92 LV SV Index:   54 LVOT Area:     3.80 cm  LV Volumes (MOD) LV vol d, MOD A2C: 143.0 ml LV vol d, MOD A4C: 107.0 ml LV vol s, MOD A2C: 67.0 ml LV vol s, MOD A4C: 52.7 ml LV SV MOD A2C:     76.0 ml LV SV MOD A4C:     107.0 ml LV SV MOD BP:      64.1 ml RIGHT VENTRICLE             IVC RV S prime:     15.20 cm/s  IVC diam: 1.63 cm TAPSE (M-mode): 2.1 cm LEFT ATRIUM             Index       RIGHT ATRIUM           Index LA diam:        1.90 cm 1.12 cm/m  RA Area:     10.10 cm LA Vol (A2C):   35.4 ml 20.86 ml/m RA Volume:   19.70 ml  11.61 ml/m LA Vol (A4C):   22.2 ml 13.08 ml/m LA Biplane Vol: 30.6 ml 18.03 ml/m  AORTIC VALVE LVOT Vmax:   124.00 cm/s LVOT Vmean:  91.600 cm/s LVOT VTI:    0.242 m  AORTA Ao Root diam: 3.50 cm MITRAL VALVE                TRICUSPID VALVE MV Area (PHT): 3.37 cm     TR Peak grad:   4.6 mmHg MV Decel Time: 225 msec     TR Vmax:        107.00 cm/s MV E velocity: 84.20 cm/s MV A velocity: 111.00 cm/s  SHUNTS MV E/A ratio:  0.76         Systemic VTI:  0.24 m                             Systemic Diam: 2.20 cm Skeet Latch MD Electronically signed by Skeet Latch MD Signature Date/Time: 08/22/2019/1:08:38 PM    Final    US Abdomen Limited RUQ  Addendum Date: 08/21/2019   ADDENDUM REPORT: 08/21/2019 08:55 EXAM: Right upper quadrant ultrasound examination. History: Right upper quadrant abdominal  pain and chest pain. History of cholecystectomy. Gallbladder: Surgically absent. Echogenic structure in the gallbladder fossa could be due to surgical clips. Common bile duct: 8.0 mm Liver: Normal echogenicity. No intrahepatic biliary dilatation. Small echogenic lesion noted in the right lobe peripherally measuring 1.2 x 1.1 cm. This is most likely a benign hepatic hemangioma. Normal directional flow in the portal vein. Other: Simple right renal cysts are noted. IMPRESSION: 1. Status post cholecystectomy.  Normal caliber common bile duct. 2. Small echogenic lesion in the liver is likely a benign hemangioma. Not definitely identified on prior ultrasound or CT examinations. Recommend follow-up ultrasound examination and 4-6 months to document stability. Electronically Signed   By: Marijo Sanes M.D.   On: 08/21/2019 08:55   Result Date: 08/21/2019 : Please pick the correct US ABDOMEN LIMITED template. Electronically Signed: By: Marijo Sanes M.D. On: 08/21/2019 08:09    Microbiology: Recent Results (  from the past 240 hour(s))  Respiratory Panel by RT PCR (Flu A&B, Covid) - Nasopharyngeal Swab     Status: None   Collection Time: 08/21/19  7:27 AM   Specimen: Nasopharyngeal Swab  Result Value Ref Range Status   SARS Coronavirus 2 by RT PCR NEGATIVE NEGATIVE Final    Comment: (NOTE) SARS-CoV-2 target nucleic acids are NOT DETECTED. The SARS-CoV-2 RNA is generally detectable in upper respiratoy specimens during the acute phase of infection. The lowest concentration of SARS-CoV-2 viral copies this assay can detect is 131 copies/mL. A negative result does not preclude SARS-Cov-2 infection and should not be used as the sole basis for treatment or other patient management decisions. A negative result may occur with  improper specimen collection/handling, submission of specimen other than nasopharyngeal swab, presence of viral mutation(s) within the areas targeted by this assay, and inadequate number of  viral copies (<131 copies/mL). A negative result must be combined with clinical observations, patient history, and epidemiological information. The expected result is Negative. Fact Sheet for Patients:  PinkCheek.be Fact Sheet for Healthcare Providers:  GravelBags.it This test is not yet ap proved or cleared by the [place] FDA and  has been authorized for detection and/or diagnosis of SARS-CoV-2 by FDA under an Emergency Use Authorization (EUA). This EUA will remain  in effect (meaning this test can be used) for the duration of the COVID-19 declaration under Section 564(b)(1) of the Act, 21 U.S.C. section 360bbb-3(b)(1), unless the authorization is terminated or revoked sooner.    Influenza A by PCR NEGATIVE NEGATIVE Final   Influenza B by PCR NEGATIVE NEGATIVE Final    Comment: (NOTE) The Xpert Xpress SARS-CoV-2/FLU/RSV assay is intended as an aid in  the diagnosis of influenza from Nasopharyngeal swab specimens and  should not be used as a sole basis for treatment. Nasal washings and  aspirates are unacceptable for Xpert Xpress SARS-CoV-2/FLU/RSV  testing. Fact Sheet for Patients: PinkCheek.be Fact Sheet for Healthcare Providers: GravelBags.it This test is not yet approved or cleared by the [place] FDA and  has been authorized for detection and/or diagnosis of SARS-CoV-2 by  FDA under an Emergency Use Authorization (EUA). This EUA will remain  in effect (meaning this test can be used) for the duration of the  Covid-19 declaration under Section 564(b)(1) of the Act, 21  U.S.C. section 360bbb-3(b)(1), unless the authorization is  terminated or revoked. Performed at [facility] [place] [address], [place], [place] [address]      Labs: Basic Metabolic Panel: Recent Labs  Lab 08/21/19 0601 08/21/19 1712 08/22/19 0350 08/23/19 0454  08/24/19 0341  NA 136  --  135 135 136  K 3.9  --  3.9 3.4* 4.0  CL 100  --  104 106 105  CO2 23  --  22 24 22   GLUCOSE 130*  --  115* 119* 107*  BUN 11  --  8 7 7   CREATININE 0.68 0.61 0.64 0.58* 0.63  CALCIUM 10.3  --  9.4 9.2 9.5  MG  --   --   --  1.6* 1.7  PHOS  --   --   --  2.5 1.9*   Liver Function Tests: Recent Labs  Lab 08/21/19 0601 08/22/19 0350  AST 61* 196*  ALT 25 30  ALKPHOS 104 85  BILITOT 0.7 0.6  PROT 8.7* 6.8  ALBUMIN 3.7 2.5*   Recent Labs  Lab 08/21/19 0601  LIPASE 37   No results for input(s): AMMONIA  in the last 168 hours. CBC: Recent Labs  Lab 08/21/19 0601 08/21/19 1712 08/22/19 0350 08/23/19 0454 08/24/19 0341  WBC 5.9 5.0 7.2 11.7* 11.9*  HGB 10.3* 9.1* 8.8* 8.2* 8.5*  HCT 33.7* 29.1* 27.6* 25.6* 26.9*  MCV 81.8 79.9* 80.2 79.0* 78.9*  PLT 329 267 283 250 290   Cardiac Enzymes: No results for input(s): CKTOTAL, CKMB, CKMBINDEX, TROPONINI in the last 168 hours. BNP: BNP (last 3 results) Recent Labs    08/21/19 1712  BNP 87.3    ProBNP (last 3 results) No results for input(s): PROBNP in the last 8760 hours.  CBG: No results for input(s): GLUCAP in the last 168 hours.     Signed:  Dia Crawford, MD [facility] [phone] pager

## 2019-08-26 ENCOUNTER — Telehealth: Payer: Self-pay | Admitting: Cardiology

## 2019-08-26 NOTE — Telephone Encounter (Signed)
New Message   Jonathan [name] is calling from Dr. Sinclair Ship office is wanting to know if the patient should be started on an aspirin. He was just discharged from the hospital and does not have a TOC appt with [place] until 5/12. Dr. Percival Spanish consulted with him in the hospital. Please call to advise.

## 2019-08-26 NOTE — Telephone Encounter (Signed)
Returned call to [place] with Dr. Terrill Mohr spoke to patient earlier and noticed he was not d/c on ASA.    She saw he had a blockage and wanted to verify he didn't need to be on this.     Per chart review: Last note by Dr. Percival Spanish states "started on ASA" D/C summary by internal medicine does not list ASA on med list  Do not see any mention of reason he would be taken off this or not taking it.      Advised would verify with Dr. Percival Spanish and return call.

## 2019-08-26 NOTE — Telephone Encounter (Signed)
Needs to be on ASA 81 mg daily

## 2019-08-27 MED ORDER — ASPIRIN EC 81 MG PO TBEC
81.0000 mg | DELAYED_RELEASE_TABLET | Freq: Every day | ORAL | 3 refills | Status: DC
Start: 2019-08-27 — End: 2020-10-30

## 2019-08-27 NOTE — Telephone Encounter (Signed)
Patient aware that Torena from Dr. Thompson Caul office will be updating the patient. Disregard Kayla's call.

## 2019-08-27 NOTE — Telephone Encounter (Signed)
Per Dr. Percival Spanish patient needs to be on 81mg  of Aspirin. Medication list updated. Attempted to call patient to update but he is currently working with home health. Spoke with Torena from Dr. Thompson Caul office who reports she will update the patient today.

## 2019-08-28 ENCOUNTER — Ambulatory Visit (INDEPENDENT_AMBULATORY_CARE_PROVIDER_SITE_OTHER): Payer: Medicare Other | Admitting: Podiatry

## 2019-08-28 ENCOUNTER — Encounter: Payer: Self-pay | Admitting: Podiatry

## 2019-08-28 ENCOUNTER — Other Ambulatory Visit: Payer: Self-pay

## 2019-08-28 DIAGNOSIS — M79674 Pain in right toe(s): Secondary | ICD-10-CM | POA: Diagnosis not present

## 2019-08-28 DIAGNOSIS — M79675 Pain in left toe(s): Secondary | ICD-10-CM | POA: Diagnosis not present

## 2019-08-28 DIAGNOSIS — B351 Tinea unguium: Secondary | ICD-10-CM

## 2019-08-28 DIAGNOSIS — M2141 Flat foot [pes planus] (acquired), right foot: Secondary | ICD-10-CM

## 2019-08-28 DIAGNOSIS — I83013 Varicose veins of right lower extremity with ulcer of ankle: Secondary | ICD-10-CM

## 2019-08-28 DIAGNOSIS — L97319 Non-pressure chronic ulcer of right ankle with unspecified severity: Secondary | ICD-10-CM

## 2019-08-28 DIAGNOSIS — M2142 Flat foot [pes planus] (acquired), left foot: Secondary | ICD-10-CM

## 2019-08-28 NOTE — Progress Notes (Signed)
This patient returns to my office for at risk foot care.  This patient requires this care by a professional since this patient will be at risk due to having venous insufficiency and foot/leg ulcers.  This patient is unable to cut nails himself since the patient cannot reach his nails.These nails are painful walking and wearing shoes.  This patient presents for at risk foot care today. Patient has been treated by  the wound center with unna boots  B/L.    General Appearance  Alert, conversant and in no acute stress.  Vascular  Deberred due to unna boots.  Neurologic  Deferred due to unna boots  Nails Thick disfigured discolored nails with subungual debris  from hallux to fifth toes bilaterally. No evidence of bacterial infection or drainage bilaterally.  Orthopedic  No limitations of motion  feet .  No crepitus or effusions noted.  No bony pathology or digital deformities noted.  Skin  normotropic skin with no porokeratosis noted bilaterally.  No signs of infections or ulcers noted.     Onychomycosis  Pain in right toes  Pain in left toes  Consent was obtained for treatment procedures.   Mechanical debridement of nails 1-5  bilaterally performed with a nail nipper.  Filed with dremel without incident.  Applied coban to reinforce his unna boot.   Return office visit   3 months                  Told patient to return for periodic foot care and evaluation due to potential at risk complications.   Gardiner Barefoot DPM

## 2019-08-29 ENCOUNTER — Ambulatory Visit (HOSPITAL_COMMUNITY)
Admission: RE | Admit: 2019-08-29 | Discharge: 2019-08-29 | Disposition: A | Discharge status: Home / Self Care | Payer: Medicare Other | Source: Ambulatory Visit | Attending: Neurosurgery | Admitting: Neurosurgery

## 2019-08-29 DIAGNOSIS — M47816 Spondylosis without myelopathy or radiculopathy, lumbar region: Secondary | ICD-10-CM | POA: Diagnosis not present

## 2019-09-02 DIAGNOSIS — I255 Ischemic cardiomyopathy: Secondary | ICD-10-CM

## 2019-09-02 HISTORY — DX: Ischemic cardiomyopathy: I25.5

## 2019-09-04 ENCOUNTER — Telehealth: Payer: Self-pay | Admitting: Podiatry

## 2019-09-04 ENCOUNTER — Ambulatory Visit: Payer: Medicare Other | Admitting: General Practice

## 2019-09-04 NOTE — Telephone Encounter (Signed)
Doris(Social Worker from [employer]) called. Patient was unable to see her this week. Their orders are only good for one week. Doris said she will try again next week to see pt. If you need to call her back the number is [phone].

## 2019-09-05 ENCOUNTER — Encounter (HOSPITAL_BASED_OUTPATIENT_CLINIC_OR_DEPARTMENT_OTHER): Payer: Medicare Other | Admitting: Internal Medicine

## 2019-09-12 ENCOUNTER — Encounter (HOSPITAL_BASED_OUTPATIENT_CLINIC_OR_DEPARTMENT_OTHER): Payer: Medicare Other | Admitting: Internal Medicine

## 2019-09-19 ENCOUNTER — Other Ambulatory Visit: Payer: Self-pay

## 2019-09-19 ENCOUNTER — Encounter (HOSPITAL_BASED_OUTPATIENT_CLINIC_OR_DEPARTMENT_OTHER): Payer: Medicare Other | Attending: Internal Medicine | Admitting: Internal Medicine

## 2019-09-19 DIAGNOSIS — I87331 Chronic venous hypertension (idiopathic) with ulcer and inflammation of right lower extremity: Secondary | ICD-10-CM | POA: Diagnosis not present

## 2019-09-19 DIAGNOSIS — L97521 Non-pressure chronic ulcer of other part of left foot limited to breakdown of skin: Secondary | ICD-10-CM | POA: Diagnosis present

## 2019-09-19 DIAGNOSIS — M059 Rheumatoid arthritis with rheumatoid factor, unspecified: Secondary | ICD-10-CM | POA: Diagnosis not present

## 2019-09-19 DIAGNOSIS — M1990 Unspecified osteoarthritis, unspecified site: Secondary | ICD-10-CM | POA: Insufficient documentation

## 2019-09-19 DIAGNOSIS — L97211 Non-pressure chronic ulcer of right calf limited to breakdown of skin: Secondary | ICD-10-CM | POA: Insufficient documentation

## 2019-09-19 NOTE — Progress Notes (Signed)
NORA, CRICK ([id number]) Visit Report for 09/19/2019 Arrival Information Details Patient Name: Date of Service: [place], [place] 09/19/2019 1:00 PM Medical Record Number: [id number] Patient Account Number: [id number] Date of Birth/Sex: Treating RN: Sep 11, 1960 (59 y.o. [name] Primary Care Arlis Yale: Tamala Julian, [place] Other Clinician: Referring Dakari Cregger: Treating Mando Blatz/Extender: Gaye Pollack, [place] Weeks in Treatment: 32 Visit Information History Since Last Visit Added or deleted any medications: Yes Patient Arrived: Crutches Any new allergies or adverse reactions: No Arrival Time: 13:06 Had a fall or experienced change in No Accompanied By: self activities of daily living that may affect Transfer Assistance: None risk of falls: Patient Identification Verified: Yes Signs or symptoms of abuse/neglect since No Secondary Verification Process Completed: Yes last visito Patient Requires Transmission-Based Precautions: No Hospitalized since last visit: Yes Patient Has Alerts: Yes Implantable device outside of the clinic No Patient Alerts: Patient on Blood Thinner excluding ABI Right .98 cellular tissue based products placed in the ABI Left 1.01 center since last visit: Has Dressing in Place as Prescribed: Yes Has Compression in Place as Prescribed: Yes Has Footwear/Offloading in Place as Yes Prescribed: Left: Surgical Shoe with Pressure Relief Insole Right: Surgical Shoe with Pressure Relief Insole Pain Present Now: Yes Electronic Signature(s) Signed: 09/19/2019 5:31:16 PM By: Baruch Gouty RN, BSN Entered By: Baruch Gouty on 09/19/2019 13:11:21 -------------------------------------------------------------------------------- Compression Therapy Details Patient Name: Date of Service: [facility] [place] 09/19/2019 1:00 PM Medical Record Number: [id number] Patient Account Number: [id number] Date of Birth/Sex: Treating RN: 1960-05-16 (59 y.o. [name] Primary Care Ajah Vanhoose: Jeani Hawking [place] Other Clinician: Referring Corde Antonini: Treating Caridad Silveira/Extender: Gaye Pollack, [place] Weeks in Treatment: 37 Compression Therapy Performed for Wound Assessment: Wound #5 Left,Dorsal Foot Performed By: Clinician Baruch Gouty, RN Compression Type: Three Layer Pre Treatment ABI: 1 Post Procedure Diagnosis Same as Pre-procedure Electronic Signature(s) Signed: 09/19/2019 5:42:52 PM By: Deon Pilling Entered By: Deon Pilling on 09/19/2019 13:36:54 -------------------------------------------------------------------------------- Lower Extremity Assessment Details Patient Name: Date of Service: [place] 09/19/2019 1:00 PM Medical Record Number: [id number] Patient Account Number: [id number] Date of Birth/Sex: Treating RN: Sep 06, 1960 (59 y.o. [name] Primary Care Aster Eckrich: Tamala Julian, [place] Other Clinician: Referring Laqueisha Catalina: Treating Lakenya Riendeau/Extender: Gaye Pollack, [place] Weeks in Treatment: 36 Edema Assessment Assessed: [Left: No] [Right: No] Edema: [Left: Yes] [Right: Yes] Calf Left: Right: Point of Measurement: 36 cm From Medial Instep 26.4 cm 27.3 cm Ankle Left: Right: Point of Measurement: 8 cm From Medial Instep 20.8 cm 210.6 cm Vascular Assessment Pulses: Dorsalis Pedis Palpable: [Left:Yes] [Right:Yes] Electronic Signature(s) Signed: 09/19/2019 5:31:16 PM By: Baruch Gouty RN, BSN Entered By: Baruch Gouty on 09/19/2019 13:24:46 -------------------------------------------------------------------------------- Multi-Disciplinary Care Plan Details Patient Name: Date of Service: [facility] [place] 09/19/2019 1:00 PM Medical Record Number: [id number] Patient Account Number: [id number] Date of Birth/Sex: Treating RN: [birth year] (59 y.o. [name] Primary Care Darvell Monteforte: Jeani Hawking [place] Other Clinician: Referring Lyon Dumont: Treating Ramesha Poster/Extender: Gaye Pollack, [place]  [place] Weeks in Treatment: 82 Active Inactive Nutrition Nursing Diagnoses: Potential for alteratiion in Nutrition/Potential for imbalanced nutrition Goals: Patient/caregiver agrees to and verbalizes understanding of need to obtain nutritional consultation Date Initiated: 01/10/2019 T arget Resolution Date: 09/27/2019 Goal Status: Active Interventions: Provide education on nutrition Treatment Activities: Education provided on Nutrition : 05/16/2019 Patient referred to Primary Care Physician for further nutritional evaluation : 01/10/2019 Notes: Electronic Signature(s) Signed: 09/19/2019 5:42:52 PM By: Deon Pilling Entered By: Deon Pilling on 09/19/2019 13:35:59 --------------------------------------------------------------------------------  Pain Assessment Details Patient Name: Date of Service: [place]yman [place], [place] 09/19/2019 1:00 PM Medical Record Number: [id number] Patient Account Number: [id number] Date of Birth/Sex: Treating RN: 1960/06/27 (59 y.o. [name] Primary Care Meliya Mcconahy: Tamala Julian, [place] Other Clinician: Referring Shalyn Koral: Treating Royce Sciara/Extender: Gaye Pollack, [place] Weeks in Treatment: 44 Active Problems Location of Pain Severity and Description of Pain Patient Has Paino Yes Site Locations Pain Location: Pain in Ulcers With Dressing Change: Yes Duration of the Pain. Constant / Intermittento Intermittent Rate the pain. Current Pain Level: 4 Least Pain Level: 0 Character of Pain Describe the Pain: Aching Pain Management and Medication Current Pain Management: Rest: Yes Other: tolerable Is the Current Pain Management Adequate: Adequate How does your wound impact your activities of daily livingo Sleep: Yes Bathing: No Appetite: No Relationship With Others: No Bladder Continence: No Emotions: No Bowel Continence: No Work: No Toileting: No Drive: No Dressing: No Hobbies: No Electronic Signature(s) Signed: 09/19/2019 5:31:16 PM By:  Baruch Gouty RN, BSN Entered By: Baruch Gouty on 09/19/2019 13:12:26 -------------------------------------------------------------------------------- Patient/Caregiver Education Details Patient Name: Date of Service: [place] 5/27/2021andnbsp1:00 PM Medical Record Number: [id number] Patient Account Number: [id number] Date of Birth/Gender: Treating RN: 1960-07-28 (59 y.o. [name] Primary Care Physician: Jeani Hawking [place] Other Clinician: Referring Physician: Treating Physician/Extender: Gaye Pollack, [place] Weeks in Treatment: 25 Education Assessment Education Provided To: Patient Education Topics Provided Nutrition: Handouts: Nutrition Methods: Explain/Verbal Responses: Reinforcements needed Electronic Signature(s) Signed: 09/19/2019 5:42:52 PM By: Deon Pilling Entered By: Deon Pilling on 09/19/2019 13:36:24 -------------------------------------------------------------------------------- Wound Assessment Details Patient Name: Date of Service: [facility] [place] 09/19/2019 1:00 PM Medical Record Number: [id number] Patient Account Number: [id number] Date of Birth/Sex: Treating RN: 08-17-1960 (59 y.o. [name] Primary Care Wesly Whisenant: Tamala Julian, [place] Other Clinician: Referring Theresa Wedel: Treating Manveer Gomes/Extender: Gaye Pollack, [place] Weeks in Treatment: 45 Wound Status Wound Number: 11 Primary Etiology: Inflammatory Wound Location: Right T Third oe Wound Status: Healed - Epithelialized Wounding Event: Gradually Appeared Comorbid History: Rheumatoid Arthritis, Osteoarthritis Date Acquired: 05/01/2019 Weeks Of Treatment: 20 Clustered Wound: No Wound Measurements Length: (cm) Width: (cm) Depth: (cm) Area: (cm) Volume: (cm) 0 % Reduction in Area: 100% 0 % Reduction in Volume: 100% 0 Epithelialization: Large (67-100%) 0 Tunneling: No 0 Undermining: No Wound Description Classification: Full Thickness With Exposed  Support Structures Wound Margin: Indistinct, nonvisible Exudate Amount: None Present Foul Odor After Cleansing: No Slough/Fibrino No Wound Bed Granulation Amount: None Present (0%) Exposed Structure Necrotic Amount: None Present (0%) Fascia Exposed: No Fat Layer (Subcutaneous Tissue) Exposed: No Tendon Exposed: No Muscle Exposed: No Joint Exposed: No Bone Exposed: No Electronic Signature(s) Signed: 09/19/2019 5:31:16 PM By: Baruch Gouty RN, BSN Entered By: Baruch Gouty on 09/19/2019 13:27:22 -------------------------------------------------------------------------------- Wound Assessment Details Patient Name: Date of Service: [place] 09/19/2019 1:00 PM Medical Record Number: [id number] Patient Account Number: [id number] Date of Birth/Sex: Treating RN: 07-31-1960 (59 y.o. [name] Primary Care Taevyn Hausen: Tamala Julian, [place] Other Clinician: Referring Kenyana Husak: Treating Lucion Dilger/Extender: Gaye Pollack, [place] Weeks in Treatment: 36 Wound Status Wound Number: 5 Primary Etiology: Inflammatory Wound Location: Left, Dorsal Foot Wound Status: Open Wounding Event: Gradually Appeared Comorbid History: Rheumatoid Arthritis, Osteoarthritis Date Acquired: 11/24/2018 Weeks Of Treatment: 36 Clustered Wound: No Wound Measurements Length: (cm) 2.3 Width: (cm) 1.1 Depth: (cm) 0.1 Area: (cm) 1.987 Volume: (cm) 0.199 % Reduction in Area: 79.2% % Reduction in Volume: 79.2% Epithelialization: None Tunneling:  No Undermining: No Wound Description Classification: Full Thickness Without Exposed Support Structures Wound Margin: Flat and Intact Exudate Amount: Medium Exudate Type: Serosanguineous Exudate Color: red, brown Foul Odor After Cleansing: No Slough/Fibrino Yes Wound Bed Granulation Amount: Large (67-100%) Exposed Structure Granulation Quality: Red Fascia Exposed: No Necrotic Amount: None Present (0%) Fat Layer (Subcutaneous Tissue) Exposed:  Yes Tendon Exposed: No Muscle Exposed: No Joint Exposed: No Bone Exposed: No Electronic Signature(s) Signed: 09/19/2019 5:31:16 PM By: Baruch Gouty RN, BSN Entered By: Baruch Gouty on 09/19/2019 13:27:41 -------------------------------------------------------------------------------- [place] Details Patient Name: Date of Service: [facility] [place] 09/19/2019 1:00 PM Medical Record Number: [id number] Patient Account Number: [id number] Date of Birth/Sex: Treating RN: 08/18/1960 (59 y.o. [name] Primary Care Camylle Whicker: Other Clinician: Jeani [place] Referring Josiah Nieto: Treating Nikoleta Dady/Extender: Gaye Pollack, [place] Weeks in Treatment: 61 Vital Signs Time Taken: 13:29 Temperature (F): 98 Height (in): 63 Pulse (bpm): 89 Source: Stated Respiratory Rate (breaths/min): 18 Weight (lbs): 162 Blood Pressure (mmHg): 111/62 Source: Stated Reference Range: 80 - 120 mg / dl Body Mass Index (BMI): 28.7 Electronic Signature(s) Signed: 09/19/2019 5:31:16 PM By: Baruch Gouty RN, BSN Entered By: Baruch Gouty on 09/19/2019 13:29:42

## 2019-09-19 NOTE — Progress Notes (Signed)
KARRSON, KAWCZYNSKI ([id number]) Visit Report for 09/19/2019 HPI Details Patient Name: Date of Service: [place], [place] 09/19/2019 1:00 PM Medical Record Number: [id number] Patient Account Number: [id number] Date of Birth/Sex: Treating RN: 10-Jan-1961 (59 y.o. [name] Primary Care Provider: Jeani Hawking [place] Other Clinician: Referring Provider: Treating Provider/Extender: Gaye Pollack, LO RI Weeks in Treatment: 56 History of Present Illness HPI Description: ADMISSION 01/10/2019 This is a 59 year old man who has rheumatoid arthritis on Remicade. He is not a diabetic. He had forefoot surgery by Dr. Bettye Boeck on June 25 for correction of hammertoes. At some point in the postop follow-up he presented with multiple wounds on the left foot. I do not really have a good description at this point but I will try to look through [facility] health link. He had arterial studies on 11/28/2018 that were within normal limits. Seen by podiatry on 8/24 with multiple left foot and lower extremity ulcers. He was treated with Bactrim and Cipro. He was hospitalized from 8/28 through 8/30 with wounds on the left anterior foot. An MRI was negative at that point the wounds were on the left anterior and left medial foot. Felt to have cellulitis. He saw his primary doctor in follow-up on 9/3 and felt to have pressure ulcers on the foot. The patient states that he developed these after some acute swelling last month. This may have been a result of infection. He comes in today with multiple wounds on his toes dorsal feet and distal lower extremities anteriorly. The exact cause of this is not completely clear. He does not have a prior wound history. He has multiple lower extremity wounds including the left medial foot, left dorsal foot, left second toe, left anterior mid tibia, right dorsal foot, right fourth toe medially and the right medial lower leg. The areas on the left medial foot and left dorsal foot are  large superficial wounds most of the rest of these are small punched out looking areas. He does not complain of a lot of pain Past medical history; hypertension, rheumatoid arthritis on Remicade infusions, hyperlipidemia right forefoot surgery on 6/25, he is on Xarelto at this point for reasons that are not totally clear. Arterial studies on 11/28/2018 showed an ABI of 0.98 on the right 1.01 on the left. TBI's of 0.79 on the right 0.75 on the left and triphasic waveforms bilaterally 9/24; wounds on his bilateral lower feet and lower legs. We have been using silver collagen on the wounds. 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 Follows with Dr. Raliegh Ip" rheumatology at [facility]. 10/1; patient with wounds on his bilateral lower feet and lower legs we have been using silver collagen. 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 He does not have macrovascular disease by previous noninvasive studies in August. 10/8; patient with wounds on his bilateral lower feet and legs. Small punched out areas. I have no doubt he has chronic venous disease however these wounds do not look like classic venous ulcers. He is generally been making good improvements. 10/15; patients with small wounds on his bilateral lower legs. He has nothing left on the right 3 wounds on the left are making good progress. On the left we have the left medial foot, left anterior tibial area and the larger area on the dorsal left  foot in close proximity to the second and third toes. All of these look a lot better. The patient has clear evidence of chronic venous insufficiency although these wounds did not look like venous insufficiency wounds 10/22; patient has no wounds left on the right. He has 3 wounds on the  left arm making good progress. Left medial foot left anterior tibia and the left dorsal foot. These are likely related to venous insufficiency. He has dark fibrotic adherent skin in his bilateral ankle areas. 11/5; still no open wounds on the right. Left medial foot and the left dorsal foot just proximal to the toes of the 2 remaining wounds. The area on the left anterior tibia is healed 11/19; 2-week follow-up. The area on the left medial foot is closed. The only remaining wound is on the left dorsal foot just proximal to the toes. The area on the left anterior tibia also had been previously healed. The patient sees his rheumatologist in early December. I asked him to mention the wounds to the rheumatologist. I had some thoughts about this being rheumatoid vasculitic related and was going to biopsy this although he seems to have done nicely with conservative treatment. The patient has his compression stocking 12/10; 3-week follow-up. He was seen by our nurses in the interim. The only thing he had last time he was here was the opening on the dorsal foot just proximal to the toes. He was wearing a stocking on the right leg. He says in the last week he developed open areas on the medial ankle and medial calf on the right. 05/02/2019; almost 4-week follow-up. He has the original wound in the left dorsal foot which is not a lot better certainly not worse. He had new wounds last time on the right medial ankle medial calf area and on the dorsal fourth toe today.. The latter is the new wound today. The patient probably has chronic venous insufficiency one would have to wonder about rheumatoid vasculitis and if these keep on forming I probably will biopsy one 1/21; the patient has a new wound on the right third toe which probes to bone. We still do not have an exact diagnosis of this. I went ahead and biopsied the right medial leg and the left dorsal foot these were shave biopsieso Vasculitis 2/4; biopsies I  did of the right medial lower leg and left dorsal foot showed stasis dermatitis without evidence of vasculitis. Special stain was negative for fungus. Fite stain was negative for Mycobacterium. We have been using Hydrofera Blue on the wounds on his legs and endoform to the right third toe. Everything looks a little better today 3/4; since the patient was last here he had his reflux studies. These showed no evidence of DVT On the right there was reflux in the right greater saphenous . vein right common femoral vein right saphenofemoral junction right popliteal vein. 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 On the left no evidence of a DVT there is no evidence of superficial venous reflux seen in the left small saphenous vein there was venous reflux noted in the left common femoral vein the left greater saphenous vein in the proximal thigh. I did not see much description of venous reflux in the lower extremities although he clearly has chronic venous changes in the skin in his lower legs. He also saw rheumatology at [facility] on 06/12/2019. They noted the nonhealing wounds in his lower extremities. Because of this they held his Remicade infusions. Started him  on prednisone 10 mg a day I believe 5 twice daily. He was to return in 2 months. They did document normal arterial studies in August, the biopsy in 2022/05/23 was negative for vasculitis fungi or Mycobacterium. His findings were suggestive of stasis dermatitis He has multiple small wounds bilaterally in color including the right medial lower extremity, right third toe, left medial lower leg left dorsal foot. He has a new area on the right medial calf. These are all very small punched out areas that do not have the appearance of venous wounds. As noted previous biopsy we did in this clinic was negative for vasculitis/rheumatoid vasculitis 3/18; patient is due to see Dr. Donnetta Hutching about reflux studies on 3/30. All the wounds on his legs and ankles  are healed. He still has a fairly sizable area on the left dorsal foot just proximal to the base of the second toe. 4/8; the patient was referred to Dr. Doren Custard by Dr. Donnetta Hutching. Dixon saw him on 4/7 which is yesterday. He is a candidate for staged laser ablation of the left greater saphenous vein and then the right greater saphenous vein. Apparently this is planned for June 5 on the left. We have been using silver alginate compressing him bilaterally. He ordered stockings from elastic therapy for some reason they have not come in yet 09/19/19-Patient is here after a month and a half was admitted for non-STEMI on 4/28 and underwent revascularization of RCA Patient has healed his right second toe has the left lower extremity wound that is the same Electronic Signature(s) Signed: 09/19/2019 1:37:20 PM By: Tobi Bastos MD, MBA Entered By: Tobi Bastos on 09/19/2019 13:37:20 -------------------------------------------------------------------------------- Physical Exam Details Patient Name: Date of Service: [facility] [place], [place] 09/19/2019 1:00 PM Medical Record Number: [id number] Patient Account Number: [id number] Date of Birth/Sex: Treating RN: Jan 04, 1961 (59 y.o. [name] Primary Care Provider: Jeani Hawking [place] Other Clinician: Referring Provider: Treating Provider/Extender: Gaye Pollack, LO RI Weeks in Treatment: 68 Constitutional alert and oriented x 3. sitting or standing blood pressure is within target range for patient.. supine blood pressure is within target range for patient.. pulse regular and within target range for patient.Marland Kitchen respirations regular, non-labored and within target range for patient.Marland Kitchen temperature within target range for patient.. . . Well- nourished and well-hydrated in no acute distress. Notes Left lower extremity wound has a healthy base, surrounding skin intact otherwise dry skin Right second toe dorsal wound has healed Electronic Signature(s) Signed:  09/19/2019 1:37:48 PM By: Tobi Bastos MD, MBA Entered By: Tobi Bastos on 09/19/2019 13:37:48 -------------------------------------------------------------------------------- Physician Orders Details Patient Name: Date of Service: [facility] [place] RL[place] N 09/19/2019 1:00 PM Medical Record Number: [id number] Patient Account Number: [id number] Date of Birth/Sex: Treating RN: 07/20/1960 (59 y.o. [name] Primary Care Provider: Jeani Hawking [place] Other Clinician: Referring Provider: Treating Provider/Extender: Gaye Pollack, LO RI Weeks in Treatment: 4 Verbal / Phone Orders: No Diagnosis Coding ICD-10 Coding Code Description L97.521 Non-pressure chronic ulcer of other part of left foot limited to breakdown of skin I87.331 Chronic venous hypertension (idiopathic) with ulcer and inflammation of right lower extremity M05.80 Other rheumatoid arthritis with rheumatoid factor of unspecified site L97.211 Non-pressure chronic ulcer of right calf limited to breakdown of skin Follow-up Appointments ppointment in 2 weeks. - Thursday Return A Dressing Change Frequency Change dressing three times week. - twice a week by home health. Skin Barriers/Peri-Wound Care TCA Cream or Ointment - liberally to left leg and all wounds in clinic  and at home by home health. patient to apply lotion to right leg nightly before bed. Wound Cleansing May shower and wash wound with soap and water. - with dressing changes. Primary Wound Dressing Wound #5 Left,Dorsal Foot Calcium Alginate with Silver Secondary Dressing Wound #5 Left,Dorsal Foot Dry Gauze Edema Control 3 Layer Compression System - Left Lower Extremity Avoid standing for long periods of time Elevate legs to the level of the heart or above for 30 minutes daily and/or when sitting, a frequency of: - throughout the day. Support Garment 20-30 mm/Hg pressure to: - patient to apply jobst compression stocking right leg in the morning and remove  at night. [facility] for wound care. - [facility]. Electronic Signature(s) Signed: 09/19/2019 4:57:23 PM By: Tobi Bastos MD, MBA Signed: 09/19/2019 5:42:52 PM By: Deon Pilling Entered By: Deon Pilling on 09/19/2019 13:39:02 -------------------------------------------------------------------------------- Problem List Details Patient Name: Date of Service: [facility] [place] RL[place] N 09/19/2019 1:00 PM Medical Record Number: [id number] Patient Account Number: [id number] Date of Birth/Sex: Treating RN: 08-Nov-1960 (59 y.o. [name] Primary Care Provider: Jeani Hawking [place] Other Clinician: Referring Provider: Treating Provider/Extender: Gaye Pollack, LO RI Weeks in Treatment: 49 Active Problems ICD-10 Encounter Code Description Active Date MDM Diagnosis L97.521 Non-pressure chronic ulcer of other part of left foot limited to breakdown of 01/10/2019 No Yes skin I87.331 Chronic venous hypertension (idiopathic) with ulcer and inflammation of right 01/10/2019 No Yes lower extremity M05.80 Other rheumatoid arthritis with rheumatoid factor of unspecified site 01/10/2019 No Yes L97.211 Non-pressure chronic ulcer of right calf limited to breakdown of skin 04/04/2019 No Yes Inactive Problems ICD-10 Code Description Active Date Inactive Date L97.821 Non-pressure chronic ulcer of other part of left lower leg limited to breakdown of skin 01/10/2019 01/10/2019 L97.511 Non-pressure chronic ulcer of other part of right foot limited to breakdown of skin 01/10/2019 01/10/2019 L97.812 Non-pressure chronic ulcer of other part of right lower leg with fat layer exposed 01/10/2019 01/10/2019 L97.311 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 [name] Primary Care Provider: Jeani Hawking [place] Other Clinician: Referring Provider: Treating Provider/Extender: Gaye Pollack, LO RI Weeks in Treatment: 19 Subjective History of Present Illness (HPI) ADMISSION 01/10/2019 This is a 59 year old man who has rheumatoid arthritis on Remicade. He is not a diabetic. He had forefoot surgery by Dr. Bettye Boeck on June 25 for correction of hammertoes. At some point in the postop follow-up he presented with multiple wounds on the left foot. I do not really have a good description at this point but I will try to look through [facility] health link. He had arterial studies on 11/28/2018 that were within normal limits. Seen by podiatry on 8/24 with multiple left foot and lower extremity ulcers. He was treated with Bactrim and Cipro. He was hospitalized from 8/28 through 8/30 with wounds on the left anterior foot. An MRI was negative at that point the wounds were on the left anterior and left medial foot. Felt to have cellulitis. He saw his primary doctor in follow-up on 9/3 and felt to have pressure ulcers on  the foot. The patient states that he developed these after some acute swelling last month. This may have been a result of infection. He comes in today with multiple wounds on his toes dorsal feet and distal lower extremities anteriorly. The exact cause of this is not completely clear. He does not have a prior wound history. He has multiple lower extremity wounds including the left medial foot, left dorsal foot, left second toe, left anterior mid tibia, right dorsal foot, right fourth toe medially and the right medial lower leg.  The areas on the left medial foot and left dorsal foot are large superficial wounds most of the rest of these are small punched out looking areas. He does not complain of a lot of pain Past medical history; hypertension, rheumatoid arthritis on Remicade infusions, hyperlipidemia right forefoot surgery on 6/25, he is on Xarelto at this point for reasons that are not totally clear. Arterial studies on 11/28/2018 showed an ABI of 0.98 on the right 1.01 on the left. TBI's of 0.79 on the right 0.75 on the left and triphasic waveforms bilaterally 9/24; wounds on his bilateral lower feet and lower legs. We have been using silver collagen on the wounds. 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 Follows with Dr. Raliegh Ip" rheumatology at [facility]. 10/1; patient with wounds on his bilateral lower feet and lower legs we have been using silver collagen. 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 He does not have macrovascular disease by previous noninvasive studies in August. 10/8; patient with wounds on his bilateral lower feet and legs. Small punched out areas. I have no doubt he has chronic venous disease however these wounds do not look like classic venous ulcers. He is generally been making good improvements. 10/15; patients with small wounds on his bilateral lower legs. He has nothing left on the right 3 wounds on the left are making good progress. On the left we have the left medial foot, left anterior tibial area and the larger area on the dorsal left foot in close proximity to the second and third toes. All of these look a lot better. The patient has clear evidence of chronic venous insufficiency although these wounds did not look like venous insufficiency wounds 10/22; patient  has no wounds left on the right. He has 3 wounds on the left arm making good progress. Left medial foot left anterior tibia and the left dorsal foot. These are likely related to venous insufficiency. He has dark fibrotic adherent skin in his bilateral ankle areas. 11/5; still no open wounds on the right. Left medial foot and the left dorsal foot just proximal to the toes of the 2 remaining wounds. The area on the left anterior tibia is healed 11/19; 2-week follow-up. The area on the left medial foot is closed. The only remaining wound is on the left dorsal foot just proximal to the toes. The area on the left anterior tibia also had been previously healed. The patient sees his rheumatologist in early December. I asked him to mention the wounds to the rheumatologist. I had some thoughts about this being rheumatoid vasculitic related and was going to biopsy this although he seems to have done nicely with conservative treatment. The patient has his compression stocking 12/10; 3-week follow-up. He was seen by our nurses in  the interim. The only thing he had last time he was here was the opening on the dorsal foot just proximal to the toes. He was wearing a stocking on the right leg. He says in the last week he developed open areas on the medial ankle and medial calf on the right. 05/02/2019; almost 4-week follow-up. He has the original wound in the left dorsal foot which is not a lot better certainly not worse. He had new wounds last time on the right medial ankle medial calf area and on the dorsal fourth toe today.. The latter is the new wound today. The patient probably has chronic venous insufficiency one would have to wonder about rheumatoid vasculitis and if these keep on forming I probably will biopsy one 1/21; the patient has a new wound on the right third toe which probes to bone. We still do not have an exact diagnosis of this. I went ahead and biopsied the right medial leg and the left dorsal foot  these were shave biopsieso Vasculitis 2/4; biopsies I did of the right medial lower leg and left dorsal foot showed stasis dermatitis without evidence of vasculitis. Special stain was negative for fungus. Fite stain was negative for Mycobacterium. We have been using Hydrofera Blue on the wounds on his legs and endoform to the right third toe. Everything looks a little better today 3/4; since the patient was last here he had his reflux studies. These showed no evidence of DVT On the right there was reflux in the right greater saphenous . vein right common femoral vein right saphenofemoral junction right popliteal vein. 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 On the left no evidence of a DVT there is no evidence of superficial venous reflux seen in the left small saphenous vein there was venous reflux noted in the left common femoral vein the left greater saphenous vein in the proximal thigh. I did not see much description of venous reflux in the lower extremities although he clearly has chronic venous changes in the skin in his lower legs. He also saw rheumatology at [facility] on 06/12/2019. They noted the nonhealing wounds in his lower extremities. Because of this they held his Remicade infusions. Started him on prednisone 10 mg a day I believe 5 twice daily. He was to return in 2 months. They did document normal arterial studies in August, the biopsy in 06-07-2022 was negative for vasculitis fungi or Mycobacterium. His findings were suggestive of stasis dermatitis He has multiple small wounds bilaterally in color including the right medial lower extremity, right third toe, left medial lower leg left dorsal foot. He has a new area on the right medial calf. These are all very small punched out areas that do not have the appearance of venous wounds. As noted previous biopsy we did in this clinic was negative for vasculitis/rheumatoid vasculitis 3/18; patient is due to see Dr. Donnetta Hutching about reflux  studies on 3/30. All the wounds on his legs and ankles are healed. He still has a fairly sizable area on the left dorsal foot just proximal to the base of the second toe. 4/8; the patient was referred to Dr. Doren Custard by Dr. Donnetta Hutching. Dixon saw him on 4/7 which is yesterday. He is a candidate for staged laser ablation of the left greater saphenous vein and then the right greater saphenous vein. Apparently this is planned for June 5 on the left. We have been using silver alginate compressing him bilaterally. He ordered stockings from elastic therapy for  some reason they have not come in yet 09/19/19-Patient is here after a month and a half was admitted for non-STEMI on 4/28 and underwent revascularization of RCA Patient has healed his right second toe has the left lower extremity wound that is the same Objective Constitutional alert and oriented x 3. sitting or standing blood pressure is within target range for patient.. supine blood pressure is within target range for patient.. pulse regular and within target range for patient.Marland Kitchen respirations regular, non-labored and within target range for patient.Marland Kitchen temperature within target range for patient.. Well- nourished and well-hydrated in no acute distress. Vitals Time Taken: 1:29 PM, Height: 63 in, Source: Stated, Weight: 162 lbs, Source: Stated, BMI: 28.7, Temperature: 98 F, Pulse: 89 bpm, Respiratory Rate: 18 breaths/min, Blood Pressure: 111/62 mmHg. General Notes: Left lower extremity wound has a healthy base, surrounding skin intact otherwise dry skin Right second toe dorsal wound has healed Integumentary (Hair, Skin) Wound #11 status is Healed - Epithelialized. Original cause of wound was Gradually Appeared. The wound is located on the Right T Third. The wound oe measures 0cm length x 0cm width x 0cm depth; 0cm^2 area and 0cm^3 volume. There is no tunneling or undermining noted. There is a none present amount of drainage noted. The wound margin is  indistinct and nonvisible. There is no granulation within the wound bed. There is no necrotic tissue within the wound bed. Wound #5 status is Open. Original cause of wound was Gradually Appeared. The wound is located on the Left,Dorsal Foot. The wound measures 2.3cm length x 1.1cm width x 0.1cm depth; 1.987cm^2 area and 0.199cm^3 volume. There is Fat Layer (Subcutaneous Tissue) Exposed exposed. There is no tunneling or undermining noted. There is a medium amount of serosanguineous drainage noted. The wound margin is flat and intact. There is large (67-100%) red granulation within the wound bed. There is no necrotic tissue within the wound bed. Assessment Active Problems ICD-10 Non-pressure chronic ulcer of other part of left foot limited to breakdown of skin Chronic venous hypertension (idiopathic) with ulcer and inflammation of right lower extremity Other rheumatoid arthritis with rheumatoid factor of unspecified site Non-pressure chronic ulcer of right calf limited to breakdown of skin Procedures Wound #5 Pre-procedure diagnosis of Wound #5 is an Inflammatory located on the Left,Dorsal Foot . There was a Three Layer Compression Therapy Procedure with a pre-treatment ABI of 1 by Baruch Gouty, RN. Post procedure Diagnosis Wound #5: Same as Pre-Procedure Plan -Continue silver alginate with 3 layer compression to the left leg -The right leg can be placed in Job stockings -Return to clinic in 2 weeks, patient has vein appointment for ablation that will probably be done by the time he comes the next time Electronic Signature(s) Signed: 09/19/2019 1:38:41 PM By: Tobi Bastos MD, MBA Entered By: Tobi Bastos on 09/19/2019 13:38:41 -------------------------------------------------------------------------------- SuperBill Details Patient Name: Date of Service: [facility] [place], [place] 09/19/2019 Medical Record Number: [id number] Patient Account Number: [id number] Date of Birth/Sex: Treating  RN: 08/03/1960 (59 y.o. [name] Primary Care Provider: Jeani Hawking [place] Other Clinician: Referring Provider: Treating Provider/Extender: Gaye Pollack, LO RI Weeks in Treatment: 36 Diagnosis Coding ICD-10 Codes Code Description [phone] Non-pressure chronic ulcer of other part of left foot limited to breakdown of skin I87.331 Chronic venous hypertension (idiopathic) with ulcer and inflammation of right lower extremity M05.80 Other rheumatoid arthritis with rheumatoid factor of unspecified site L97.211 Non-pressure chronic ulcer of right calf limited to breakdown of skin Facility Procedures The patient participates  with Medicare or their insurance follows the Medicare Facility Guidelines: CPT4 Code Description Modifier Quantity [id number] (Facility Use Only) [id number] - [place] 1 Physician Procedures : CPT4 Code Description Modifier [id number] - WC PHYS LEVEL 3 - EST PT ICD-10 Diagnosis Description L97.521 Non-pressure chronic ulcer of other part of left foot limited to breakdown of skin Quantity: 1 Electronic Signature(s) Signed: 09/19/2019 4:57:23 PM By: Tobi Bastos MD, MBA Signed: 09/19/2019 5:42:52 PM By: Deon Pilling Previous Signature: 09/19/2019 1:38:54 PM Version By: Tobi Bastos MD, MBA Entered By: Deon Pilling on 09/19/2019 14:17:09

## 2019-09-21 ENCOUNTER — Emergency Department (HOSPITAL_COMMUNITY): Payer: Medicare Other

## 2019-09-21 ENCOUNTER — Emergency Department (HOSPITAL_COMMUNITY)
Admission: EM | Admit: 2019-09-21 | Discharge: 2019-09-21 | Disposition: A | Discharge status: Home / Self Care | Payer: Medicare Other | Attending: Emergency Medicine | Admitting: Emergency Medicine

## 2019-09-21 DIAGNOSIS — Z79899 Other long term (current) drug therapy: Secondary | ICD-10-CM | POA: Insufficient documentation

## 2019-09-21 DIAGNOSIS — R05 Cough: Secondary | ICD-10-CM | POA: Diagnosis not present

## 2019-09-21 DIAGNOSIS — L97529 Non-pressure chronic ulcer of other part of left foot with unspecified severity: Secondary | ICD-10-CM | POA: Insufficient documentation

## 2019-09-21 DIAGNOSIS — I110 Hypertensive heart disease with heart failure: Secondary | ICD-10-CM | POA: Diagnosis not present

## 2019-09-21 DIAGNOSIS — Z87891 Personal history of nicotine dependence: Secondary | ICD-10-CM | POA: Diagnosis not present

## 2019-09-21 DIAGNOSIS — M79676 Pain in unspecified toe(s): Secondary | ICD-10-CM

## 2019-09-21 DIAGNOSIS — Z7982 Long term (current) use of aspirin: Secondary | ICD-10-CM | POA: Insufficient documentation

## 2019-09-21 DIAGNOSIS — Z7901 Long term (current) use of anticoagulants: Secondary | ICD-10-CM | POA: Diagnosis not present

## 2019-09-21 DIAGNOSIS — Z96653 Presence of artificial knee joint, bilateral: Secondary | ICD-10-CM | POA: Diagnosis not present

## 2019-09-21 DIAGNOSIS — Z96643 Presence of artificial hip joint, bilateral: Secondary | ICD-10-CM | POA: Insufficient documentation

## 2019-09-21 DIAGNOSIS — I5020 Unspecified systolic (congestive) heart failure: Secondary | ICD-10-CM | POA: Diagnosis not present

## 2019-09-21 DIAGNOSIS — M79672 Pain in left foot: Secondary | ICD-10-CM | POA: Diagnosis present

## 2019-09-21 DIAGNOSIS — R531 Weakness: Secondary | ICD-10-CM | POA: Diagnosis not present

## 2019-09-21 LAB — LACTIC ACID, PLASMA
Lactic Acid, Venous: 2.3 mmol/L (ref 0.5–1.9)
Lactic Acid, Venous: 1.6 mmol/L (ref 0.5–1.9)

## 2019-09-21 LAB — COMPREHENSIVE METABOLIC PANEL
ALT: 16 U/L (ref 0–44)
AST: 30 U/L (ref 15–41)
Albumin: 3 g/dL — ABNORMAL LOW (ref 3.5–5.0)
Alkaline Phosphatase: 82 U/L (ref 38–126)
Anion gap: 16 — ABNORMAL HIGH (ref 5–15)
BUN: 11 mg/dL (ref 6–20)
CO2: 22 mmol/L (ref 22–32)
Calcium: 10.3 mg/dL (ref 8.9–10.3)
Chloride: 102 mmol/L (ref 98–111)
Creatinine, Ser: 0.68 mg/dL (ref 0.61–1.24)
GFR calc Af Amer: >60 mL/min (ref >60)
GFR calc non Af Amer: >60 mL/min (ref >60)
Glucose, Bld: 138 mg/dL — ABNORMAL HIGH (ref 70–99)
Potassium: 3.4 mmol/L — ABNORMAL LOW (ref 3.5–5.1)
Sodium: 140 mmol/L (ref 135–145)
Total Bilirubin: 0.5 mg/dL (ref 0.3–1.2)
Total Protein: 7.9 g/dL (ref 6.5–8.1)

## 2019-09-21 LAB — CBG MONITORING, ED: Glucose-Capillary: 124 mg/dL — ABNORMAL HIGH (ref 70–99)

## 2019-09-21 LAB — CBC WITH DIFFERENTIAL/PLATELET
Abs Immature Granulocytes: 0.03 10*3/uL (ref 0.00–0.07)
Basophils Absolute: 0 10*3/uL (ref 0.0–0.1)
Basophils Relative: 1 %
Eosinophils Absolute: 0.2 10*3/uL (ref 0.0–0.5)
Eosinophils Relative: 4 %
HCT: 31.9 % — ABNORMAL LOW (ref 39.0–52.0)
Hemoglobin: 10 g/dL — ABNORMAL LOW (ref 13.0–17.0)
Immature Granulocytes: 1 %
Lymphocytes Relative: 18 %
Lymphs Abs: 1 10*3/uL (ref 0.7–4.0)
MCH: 24.9 pg — ABNORMAL LOW (ref 26.0–34.0)
MCHC: 31.3 g/dL (ref 30.0–36.0)
MCV: 79.6 fL — ABNORMAL LOW (ref 80.0–100.0)
Monocytes Absolute: 0.6 10*3/uL (ref 0.1–1.0)
Monocytes Relative: 11 %
Neutro Abs: 3.6 10*3/uL (ref 1.7–7.7)
Neutrophils Relative %: 65 %
Platelets: 344 10*3/uL (ref 150–400)
RBC: 4.01 MIL/uL — ABNORMAL LOW (ref 4.22–5.81)
RDW: 13.9 % (ref 11.5–15.5)
WBC: 5.5 10*3/uL (ref 4.0–10.5)
nRBC: 0 % (ref 0.0–0.2)

## 2019-09-21 LAB — TROPONIN I (HIGH SENSITIVITY)
Troponin I (High Sensitivity): 22 ng/L — ABNORMAL HIGH (ref <18)
Troponin I (High Sensitivity): 24 ng/L — ABNORMAL HIGH (ref <18)

## 2019-09-21 MED ORDER — OXYCODONE HCL 5 MG PO TABS: 2.5000 mg | ORAL_TABLET | ORAL | 0 refills | Status: DC | PRN

## 2019-09-21 MED ORDER — HYDROCODONE-ACETAMINOPHEN 5-325 MG PO TABS
2.0000 | ORAL_TABLET | Freq: Once | ORAL | Status: AC
  Administered 2019-09-21: 2 via ORAL
  Filled 2019-09-21: qty 2

## 2019-09-21 MED ORDER — POTASSIUM CHLORIDE CRYS ER 20 MEQ PO TBCR
40.0000 meq | EXTENDED_RELEASE_TABLET | Freq: Once | ORAL | Status: AC
  Administered 2019-09-21: 40 meq via ORAL
  Filled 2019-09-21: qty 2

## 2019-09-21 MED ORDER — OXYCODONE HCL 5 MG PO TABS
5.0000 mg | ORAL_TABLET | Freq: Once | ORAL | Status: AC
  Administered 2019-09-21: 5 mg via ORAL
  Filled 2019-09-21: qty 1

## 2019-09-21 MED ORDER — SODIUM CHLORIDE 0.9 % IV BOLUS
1000.0000 mL | Freq: Once | INTRAVENOUS | Status: AC
  Administered 2019-09-21: 1000 mL via INTRAVENOUS

## 2019-09-21 NOTE — Discharge Instructions (Signed)
Follow up with your doctor in the office.  Return for worsening pain, fevers or chills.

## 2019-09-21 NOTE — Progress Notes (Signed)
Interventional cardiology.  Code STEMI activated by EMS 59 yo BM admitted one month ago with an NSTEMI. Had cardiac cath showing occlusion of distal RCA that was treated medically. No other significant disease. Q wave inferiorly  Today patient called EMS for foot pain. No chest pain or dyspnea. Ecg shows NSR with inferior Q waves. There is some ST elevation in the precordial leads that is new compared to last month but similar to 2015 and is more consistent with early repolarization  Given recent known cardiac evaluation and lack of cardiac symptoms Code STEMI cancelled.   Jonathan [name] Martinique MD, [facility]

## 2019-09-21 NOTE — ED Provider Notes (Signed)
59 yo M I received in signout from Dr. Delice Lesch on.  Briefly the patient is complaining of left foot pain.  No obvious signs of infection.  Unfortunately had EKG changes from his baseline.  No chest pain or shortness of breath.  Was seen by cardiology at bedside.  Plan for serial troponins if stable then discharge home.  Troponins are unchanging.  Patient is requesting some pain medicine for home so that he finished his oxycodone tablets at the beginning of the month.  We will give a short supply.  Urged him to follow-up with his family doctor for his chronic pain management.Tyrone Nine, Linna Hoff, DO 09/21/19 1958

## 2019-09-21 NOTE — ED Triage Notes (Signed)
Pt called out to EMS for gangrenous toe pain, EKG found to have a STEMI. No chest pain, SOB. Nauseated, diaphoretic. AOx4 on arrival  Pressure 98/58, HR 104, RR 18, 98% RA Temp 101.2

## 2019-09-21 NOTE — ED Provider Notes (Addendum)
[facility] EMERGENCY DEPARTMENT Provider Note   CSN: [id number] Arrival date & time: 09/21/19  1359     History Chief Complaint  Patient presents with  . Code STEMI    Jarette Pullum is a 59 y.o. male.  Patient with hx cad, arrives via ems as possible code stemi. EMS indicates pt called them with c/o left foot pain > right foot pain - has chronic wound/dressing to left foot/lower leg, and noted increased pain in past few days. No numbness or weakness. Denies injury.  Symptoms gradual onset, constant, moderate, persistent, non radiating. Superficial ulcer to dorsum of midfoot - denies purulent drainage or malodor. Pt denies fever or chills - EMS notes pt sitting outside/in sun and temp 101. Pt noted generally feeling weak. EMS did ecg and reading was 'acute mi', and they activated code stemi. Patient denies any current or recent chest pain. Did have mi/cath approximately 1 month ago.   The history is provided by the patient and the EMS personnel.       Past Medical History:  Diagnosis Date  . Anemia    H/o using iron in the past   . Arthritis    rheumatoid  . Chronic back pain   . Hyperlipidemia   . Joint pain   . Joint swelling   . Rheumatoid arthritis(714.0)     Patient Active Problem List   Diagnosis Date Noted  . Pain due to onychomycosis of toenails of both feet 08/28/2019  . Acute systolic CHF (congestive heart failure) ([place]) 08/22/2019  . Chronic pain syndrome 08/22/2019  . Rheumatoid arthritis ([place]) 08/22/2019  . Venous stasis ulcers ([place]) 08/22/2019  . Anemia, unspecified 08/22/2019  . NSTEMI (non-ST elevated myocardial infarction) ([place]) 08/21/2019  . Hyperglycemia 12/27/2018  . Cellulitis 12/27/2018  . Wound drainage 12/22/2018  . Foot ulcer ([place]) 12/21/2018  . Encounter for screening for HIV 12/21/2018  . Arthritis 08/22/2018  . HLD (hyperlipidemia) 08/22/2018  . Lung nodule 08/22/2018  . Renal cyst, right 08/22/2018  . Rheumatoid  arthritis involving multiple sites with positive rheumatoid factor ([place]) 08/22/2018  . Venous insufficiency 08/22/2018  . Vitamin D deficiency 08/22/2018  . Seasonal allergic rhinitis 07/16/2018  . Essential hypertension 02/23/2018  . Insomnia 02/23/2018  . Tinea versicolor 02/23/2018  . Antalgic gait 09/21/2017  . Chronic pain of both knees 09/21/2017  . Foot pain, bilateral 09/21/2017  . Elevated liver enzymes 07/10/2017  . Leg edema 06/08/2017  . Primary osteoarthritis, left ankle and foot 05/09/2017  . Post-traumatic osteoarthritis, right ankle and foot 05/09/2017  . Primary osteoarthritis, right ankle and foot 05/09/2017  . Erectile dysfunction 02/15/2017  . Excessive sweating 02/15/2017  . Difficulty transferring 01/27/2017  . Acquired bilateral flat feet 11/15/2016  . Other acquired hammer toe 11/15/2016  . Erosive gastritis 09/05/2016  . At high risk for falls 08/15/2016  . Chronic bilateral low back pain without sciatica 10/13/2015  . Drug therapy 08/03/2015  . Rheumatoid arthritis involving left hip ([place]) 01/29/2015  . 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 Status post total replacement of right hip 11/11/2014  . Gait difficulty 08/21/2013  . Benign neoplasm of colon 04/03/2013  . Reflux esophagitis 04/03/2013  . Microcytic anemia 04/01/2013  . Chronic ulcer of right leg ([place]) 01/13/2013  . Pressure ulcer 01/13/2013  . 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 Degenerative arthritis of knee 06/07/2011    Past Surgical History:  Procedure Laterality Date  . COLONOSCOPY N/A 04/03/2013   Procedure: COLONOSCOPY;  Surgeon: Irene Shipper, MD;  Location: [facility];  Service: Endoscopy;  Laterality: N/A;  . COLONOSCOPY    . ESOPHAGOGASTRODUODENOSCOPY N/A 04/03/2013   Procedure: ESOPHAGOGASTRODUODENOSCOPY (EGD);  Surgeon: Irene Shipper, MD;  Location: [facility];  Service: Endoscopy;   Laterality: N/A;  changed to moderate dr. Henrene Pastor did not want to go to the o.r. for an endo colon-jmt  . EYE SURGERY Left    "when i was a very small boy"  . JOINT REPLACEMENT     both knees  . KNEE ARTHROPLASTY  06/07/2011   Procedure: COMPUTER ASSISTED TOTAL KNEE ARTHROPLASTY;  Surgeon: Mcarthur Rossetti, MD;  Location: [facility];  Service: Orthopedics;  Laterality: Right;  Right total knee arthoplasty  . KNEE CLOSED REDUCTION  07/19/2011   Procedure: CLOSED MANIPULATION KNEE;  Surgeon: Mcarthur Rossetti, MD;  Location: [facility];  Service: Orthopedics;  Laterality: Right;  Manipulation under anesthesia right knee  . LEFT HEART CATH AND CORONARY ANGIOGRAPHY N/A 08/21/2019   Procedure: LEFT HEART CATH AND CORONARY ANGIOGRAPHY;  Surgeon: Belva Crome, MD;  Location: [facility];  Service: Cardiovascular;  Laterality: N/A;  . TOTAL HIP ARTHROPLASTY Right 11/11/2014   Procedure: RIGHT TOTAL HIP ARTHROPLASTY ANTERIOR APPROACH;  Surgeon: Mcarthur Rossetti, MD;  Location: [facility];  Service: Orthopedics;  Laterality: Right;  . TOTAL HIP ARTHROPLASTY Left 01/29/2015   Procedure: LEFT TOTAL HIP ARTHROPLASTY ANTERIOR APPROACH;  Surgeon: Mcarthur Rossetti, MD;  Location: [facility];  Service: Orthopedics;  Laterality: Left;       Family History  Problem Relation Age of Onset  . Cancer Mother        ?  Marland Kitchen Hypertension Mother   . Heart failure Father        heart transplant  . Cancer Brother   . Anesthesia problems Neg Hx   . Colon cancer Neg Hx        mother had "3" types of CA- unsure about which one  . Esophageal cancer Neg Hx   . Rectal cancer Neg Hx   . Stomach cancer Neg Hx     Social History   Tobacco Use  . Smoking status: Former Smoker    Types: Cigarettes    Quit date: 04/25/2004    Years since quitting: 15.4  . Smokeless tobacco: Never Used  Substance Use Topics  . Alcohol use: No  . Drug use: No    Home Medications Prior to Admission medications   Medication Sig  Start Date End Date Taking? Authorizing Provider  acetaminophen (TYLENOL) 325 MG tablet Take 2 tablets (650 mg total) by mouth every 4 (four) hours as needed for headache or mild pain. 08/24/19   Allie Bossier, MD  aspirin EC 81 MG tablet Take 1 tablet (81 mg total) by mouth daily. 08/27/19   Minus Breeding, MD  atorvastatin (LIPITOR) 40 MG tablet Take 40 mg by mouth daily.    [provider]  cetirizine (ZYRTEC) 10 MG tablet Take 10 mg by mouth daily.  07/16/18   [provider]  cyclobenzaprine (FLEXERIL) 5 MG tablet Take 5 mg by mouth every 8 (eight) hours as needed. 07/10/19   [provider]  docusate sodium (COLACE) 100 MG capsule Take 1 capsule (100 mg total) by mouth 2 (two) times daily. 08/24/19   Allie Bossier, MD  fluticasone (FLONASE) 50 MCG/ACT nasal spray Place 1 spray into the  nose daily.  07/16/18   [provider]  gabapentin (NEURONTIN) 300 MG capsule Take 300 mg by mouth 2 (two) times daily.  03/24/16   [provider]  inFLIXimab (REMICADE) 100 MG injection Inject 300 mg into the vein every 6 (six) weeks.  04/14/16   [provider]  isosorbide mononitrate (IMDUR) 60 MG 24 hr tablet Take 1 tablet (60 mg total) by mouth daily. 08/25/19   Allie Bossier, MD  meloxicam (MOBIC) 15 MG tablet Take 15 mg by mouth daily. 07/18/19   [provider]  montelukast (SINGULAIR) 10 MG tablet Take 10 mg by mouth at bedtime.  01/12/19   [provider]  omeprazole (PRILOSEC) 20 MG capsule Take 20 mg by mouth daily.     [provider]  ondansetron (ZOFRAN) 4 MG tablet Take 1 tablet (4 mg total) by mouth every 6 (six) hours as needed for nausea. 08/24/19   Allie Bossier, MD  oxyCODONE (OXY IR/ROXICODONE) 5 MG immediate release tablet Take 1-2 tablets (5-10 mg total) by mouth every 4 (four) hours as needed for moderate pain. 08/24/19   Allie Bossier, MD  predniSONE (DELTASONE) 5 MG tablet Take 5 mg by mouth 2 (two) times  daily. 07/12/19   [provider]  traMADol (ULTRAM) 50 MG tablet Take 1 tablet (50 mg total) by mouth every 8 (eight) hours as needed (mild pain). 08/24/19   Allie Bossier, MD  rivaroxaban Alveda Reasons) 10 MG TABS tablet Take 10 mg by mouth daily with breakfast. 06/10/11 07/19/11  Mcarthur Rossetti, MD    Allergies    Patient has no known allergies.  Review of Systems   Review of Systems  Constitutional: Negative for chills and diaphoresis.  HENT: Negative for sore throat.   Eyes: Negative for redness.  Respiratory: Positive for cough. Negative for shortness of breath.        Occasional non prod cough.   Cardiovascular: Negative for chest pain.  Gastrointestinal: Negative for abdominal pain.  Genitourinary: Negative for dysuria and flank pain.  Musculoskeletal: Negative for back pain and neck pain.  Skin: Negative for rash.  Neurological: Negative for headaches.  Hematological: Does not bruise/bleed easily.  Psychiatric/Behavioral: Negative for confusion.    Physical Exam Updated Vital Signs BP 109/76   Pulse (!) 103   Resp 18   SpO2 99%   Physical Exam Vitals and nursing note reviewed.  Constitutional:      Appearance: Normal appearance. He is well-developed.  HENT:     Head: Atraumatic.     Nose: Nose normal.     Mouth/Throat:     Mouth: Mucous membranes are moist.     Pharynx: Oropharynx is clear.  Eyes:     General: No scleral icterus.    Conjunctiva/sclera: Conjunctivae normal.  Neck:     Trachea: No tracheal deviation.  Cardiovascular:     Rate and Rhythm: Normal rate and regular rhythm.     Pulses: Normal pulses.     Heart sounds: Normal heart sounds. No murmur. No friction rub. No gallop.   Pulmonary:     Effort: Pulmonary effort is normal. No accessory muscle usage or respiratory distress.     Breath sounds: Normal breath sounds.  Abdominal:     General: Bowel sounds are normal. There is no distension.     Palpations: Abdomen is soft.      Tenderness: There is no abdominal tenderness. There is no guarding.  Genitourinary:    Comments:  No cva tenderness. Musculoskeletal:     Cervical back: Normal range of motion and neck supple. No rigidity.     Comments: Superficial ulceration dorsum left foot, approximately 1 by 2 cm, no purulent drainage, no necrotic tissue, no crepitus. Dp/pt palp bil. No focal bony tenderness to LUE or foot. No cellulitis noted. bil feet with normal warmth/temp, and normal color. No necrotic or devitalized tissue. No focal tenderness.   Skin:    General: Skin is warm and dry.     Findings: No rash.  Neurological:     Mental Status: He is alert.     Comments: Alert, speech clear. Motor/sens grossly intact bil.   Psychiatric:        Mood and Affect: Mood normal.      ED Results / Procedures / Treatments   Labs (all labs ordered are listed, but only abnormal results are displayed) Results for orders placed or performed during the hospital encounter of 09/21/19  Lactic acid, plasma  Result Value Ref Range   Lactic Acid, Venous 2.3 (HH) 0.5 - 1.9 mmol/L  Comprehensive metabolic panel  Result Value Ref Range   Sodium 140 135 - 145 mmol/L   Potassium 3.4 (L) 3.5 - 5.1 mmol/L   Chloride 102 98 - 111 mmol/L   CO2 22 22 - 32 mmol/L   Glucose, Bld 138 (H) 70 - 99 mg/dL   BUN 11 6 - 20 mg/dL   Creatinine, Ser 0.68 0.61 - 1.24 mg/dL   Calcium 10.3 8.9 - 10.3 mg/dL   Total Protein 7.9 6.5 - 8.1 g/dL   Albumin 3.0 (L) 3.5 - 5.0 g/dL   AST 30 15 - 41 U/L   ALT 16 0 - 44 U/L   Alkaline Phosphatase 82 38 - 126 U/L   Total Bilirubin 0.5 0.3 - 1.2 mg/dL   GFR calc non Af Amer >60 >60 mL/min   GFR calc Af Amer >60 >60 mL/min   Anion gap 16 (H) 5 - 15  CBC WITH DIFFERENTIAL  Result Value Ref Range   WBC 5.5 4.0 - 10.5 K/uL   RBC 4.01 (L) 4.22 - 5.81 MIL/uL   Hemoglobin 10.0 (L) 13.0 - 17.0 g/dL   HCT 31.9 (L) 39.0 - 52.0 %   MCV 79.6 (L) 80.0 - 100.0 fL   MCH 24.9 (L) 26.0 - 34.0 pg   MCHC 31.3 30.0 -  36.0 g/dL   RDW 13.9 11.5 - 15.5 %   Platelets 344 150 - 400 K/uL   nRBC 0.0 0.0 - 0.2 %   Neutrophils Relative % 65 %   Neutro Abs 3.6 1.7 - 7.7 K/uL   Lymphocytes Relative 18 %   Lymphs Abs 1.0 0.7 - 4.0 K/uL   Monocytes Relative 11 %   Monocytes Absolute 0.6 0.1 - 1.0 K/uL   Eosinophils Relative 4 %   Eosinophils Absolute 0.2 0.0 - 0.5 K/uL   Basophils Relative 1 %   Basophils Absolute 0.0 0.0 - 0.1 K/uL   Immature Granulocytes 1 %   Abs Immature Granulocytes 0.03 0.00 - 0.07 K/uL  CBG monitoring, ED  Result Value Ref Range   Glucose-Capillary 124 (H) 70 - 99 mg/dL  Troponin I (High Sensitivity)  Result Value Ref Range   Troponin I (High Sensitivity) 22 (H) <18 ng/L   MR LUMBAR SPINE WO CONTRAST  Result Date: 08/30/2019 CLINICAL DATA:  Lumbar facet arthropathy. Additional history provided by technologist: Low back pain, pain in bilateral lower extremities for 1  month. EXAM: MRI LUMBAR SPINE WITHOUT CONTRAST TECHNIQUE: Multiplanar, multisequence MR imaging of the lumbar spine was performed. No intravenous contrast was administered. COMPARISON:  Radiographs of the lumbar spine 04/11/2016 FINDINGS: Segmentation:  5 lumbar vertebrae. Alignment: Straightening of the expected lumbar lordosis. No significant spondylolisthesis. Vertebrae: Vertebral body height is maintained. Multiple hemangiomas versus focal fat within the lumbar vertebrae and partially imaged pelvis. Mild degenerative endplate irregularity and degenerative endplate marrow edema at L5-S1. Conus medullaris and cauda equina: Conus extends to the L2 level. No signal abnormality within the visualized distal spinal cord. Paraspinal and other soft tissues: Incompletely assessed bilateral renal cysts, the largest visualized cyst on the left measures 5.5 cm. Paraspinal soft tissues within normal limits. Disc levels: Severe L5-S1 disc degeneration. Mild disc degeneration is present at L3-L4. T12-L1: No significant disc herniation or  stenosis. L1-L2: Mild facet arthrosis. No significant disc herniation or stenosis. L2-L3: Mild facet arthrosis. No significant disc herniation or stenosis. L3-L4: Small disc bulge. Mild-to-moderate facet arthrosis with minimal ligamentum flavum hypertrophy. Minimal bilateral subarticular narrowing. Central canal patent. No significant foraminal stenosis. L4-L5: Disc bulge. Moderate facet arthrosis/ligamentum flavum hypertrophy. Mild bilateral subarticular narrowing without frank nerve root impingement. Mild relative narrowing of the central canal. Mild relative left neural foraminal narrowing. L5-S1: Minimal disc bulge with endplate spurring. Mild to moderate facet arthrosis with minimal ligamentum flavum hypertrophy. No significant spinal canal stenosis. Mild right neural foraminal narrowing. IMPRESSION: Lumbar spondylosis as outlined and most notably as follows. At L4-L5, there is a disc bulge. Moderate facet arthrosis/ligamentum flavum hypertrophy. Mild bilateral subarticular and central canal narrowing without frank nerve root impingement. Mild relative left neural foraminal narrowing. At L5-S1, there is severe disc degeneration. Mild degenerative endplate marrow edema. Mild to moderate facet arthrosis with mild ligamentum flavum hypertrophy. Minimal disc bulge with endplate spurring. Mild right neural foraminal narrowing. There is also mild to moderate facet arthrosis at L3-L4 with resultant minimal bilateral subarticular narrowing. Electronically Signed   By: Kellie Simmering DO   On: 08/30/2019 08:20    EKG EKG Interpretation  Date/Time:  Saturday Sep 21 2019 14:02:20 EDT Ventricular Rate:  102 PR Interval:    QRS Duration: 94 QT Interval:  365 QTC Calculation: 476 R Axis:   -34 Text Interpretation: Sinus tachycardia Left ventricular hypertrophy `st elev v2-v5, and makred t inv inf,  is changed/new since prior Confirmed by Lajean Saver [phone]) on 09/21/2019 2:16:19 PM   Radiology No results  found.  Procedures Procedures (including critical care time)  Medications Ordered in ED Medications - No data to display  ED Course  I have reviewed the triage vital signs and the nursing notes.  Pertinent labs & imaging results that were available during my care of the patient were reviewed by me and considered in my medical decision making (see chart for details).    MDM Rules/Calculators/A&P                      Code stemi activation prior to arrival. Ecg. Continuous pulse ox and monitor. Stat labs.   Reviewed nursing notes and prior charts for additional history.  Cath report from last month reviewed.   MDM Number of Diagnoses or Management Options   Amount and/or Complexity of Data Reviewed Clinical lab tests: ordered and reviewed Tests in the radiology section of CPT: ordered and reviewed Tests in the medicine section of CPT: ordered and reviewed Discussion of test results with the performing providers: yes Decide to obtain previous medical  records or to obtain history from someone other than the patient: yes Obtain history from someone other than the patient: yes Review and summarize past medical records: yes Discuss the patient with other providers: yes Independent visualization of images, tracings, or specimens: yes  Risk of Complications, Morbidity, and/or Mortality Presenting problems: high Diagnostic procedures: high Management options: high   ECG is change from prior. Patient continues to deny any chest pain or discomfort.   Dr Martinique from cardiology evaluated in ED and cancelled code stemi - requests ED eval of patients symptoms.   Initial labs reviewed/interpreted by me - wbc normal.  CXR reviewed/interpreted by me - no pna.   Recheck pt, no cp or discomfort. No sob.   1630, additional labs/workup pending - signed out to Dr Tyrone Nine to check pending labs, including delta trop, repeat lactate, etc, recheck patient, and disposition appropriately.         Final Clinical Impression(s) / ED Diagnoses Final diagnoses:  None    Rx / DC Orders ED Discharge Orders    None          Lajean Saver, MD 09/21/19 [phone]

## 2019-09-21 NOTE — ED Notes (Signed)
Pt denies any chest pain and stated that he feels fine.

## 2019-09-26 ENCOUNTER — Ambulatory Visit (INDEPENDENT_AMBULATORY_CARE_PROVIDER_SITE_OTHER): Payer: Medicare Other | Admitting: Vascular Surgery

## 2019-09-26 ENCOUNTER — Encounter: Payer: Self-pay | Admitting: Vascular Surgery

## 2019-09-26 ENCOUNTER — Other Ambulatory Visit: Payer: Self-pay

## 2019-09-26 VITALS — BP 104/74 | HR 110 | Temp 97.9°F | Resp 16 | Ht 63.0 in | Wt 140.0 lb

## 2019-09-26 DIAGNOSIS — I83893 Varicose veins of bilateral lower extremities with other complications: Secondary | ICD-10-CM | POA: Diagnosis not present

## 2019-09-26 HISTORY — PX: ENDOVENOUS ABLATION SAPHENOUS VEIN W/ LASER: SUR449

## 2019-09-26 LAB — CULTURE, BLOOD (ROUTINE X 2)
Culture: NO GROWTH
Culture: NO GROWTH

## 2019-09-26 NOTE — Progress Notes (Signed)
Patient name: Jonathan [name] MRN: [id number] DOB: 1961-03-04 Sex: male  REASON FOR VISIT: For endovenous laser ablation of the left great saphenous vein.  HPI: Jonathan [name] is a 59 y.o. male who I had seen most recently on 07/31/2019 for follow-up of his venous disease.  He was originally seen by Dr. Donnetta Hutching.  He had venous stasis changes bilaterally and was having Unna boots replaced every 2 weeks. He had CAEP C6 disease.  Noninvasive studies had shown significant reflux in both great saphenous veins.  In addition he had deep venous reflux.  I felt that we could address his saphenous veins in a staged fashion.  Given that his ulcerations were worse on the left we elected to start on the left side.  Current Outpatient Medications  Medication Sig Dispense Refill  . acetaminophen (TYLENOL) 325 MG tablet Take 2 tablets (650 mg total) by mouth every 4 (four) hours as needed for headache or mild pain. 30 tablet 0  . aspirin EC 81 MG tablet Take 1 tablet (81 mg total) by mouth daily. 90 tablet 3  . atorvastatin (LIPITOR) 40 MG tablet Take 40 mg by mouth daily.    . cetirizine (ZYRTEC) 10 MG tablet Take 10 mg by mouth daily.     . cyclobenzaprine (FLEXERIL) 5 MG tablet Take 5 mg by mouth every 8 (eight) hours as needed.    . docusate sodium (COLACE) 100 MG capsule Take 1 capsule (100 mg total) by mouth 2 (two) times daily. 30 capsule 0  . fluticasone (FLONASE) 50 MCG/ACT nasal spray Place 1 spray into the nose daily.     Marland Kitchen gabapentin (NEURONTIN) 300 MG capsule Take 300 mg by mouth 2 (two) times daily.     Marland Kitchen inFLIXimab (REMICADE) 100 MG injection Inject 300 mg into the vein every 6 (six) weeks.     . isosorbide mononitrate (IMDUR) 60 MG 24 hr tablet Take 1 tablet (60 mg total) by mouth daily. 30 tablet 0  . meloxicam (MOBIC) 15 MG tablet Take 15 mg by mouth daily.    . montelukast (SINGULAIR) 10 MG tablet Take 10 mg by mouth at bedtime.     Marland Kitchen omeprazole (PRILOSEC) 20 MG capsule Take 20 mg by mouth  daily.     . ondansetron (ZOFRAN) 4 MG tablet Take 1 tablet (4 mg total) by mouth every 6 (six) hours as needed for nausea. 20 tablet 0  . oxyCODONE (ROXICODONE) 5 MG immediate release tablet Take 0.5 tablets (2.5 mg total) by mouth every 4 (four) hours as needed for severe pain. 3 tablet 0  . predniSONE (DELTASONE) 5 MG tablet Take 5 mg by mouth 2 (two) times daily.    . traMADol (ULTRAM) 50 MG tablet Take 1 tablet (50 mg total) by mouth every 8 (eight) hours as needed (mild pain). 20 tablet 0   No current facility-administered medications for this visit.    PHYSICAL EXAM: Vitals:   09/26/19 1008  BP: 104/74  Pulse: (!) 110  Resp: 16  Temp: 97.9 F (36.6 C)  TempSrc: Temporal  SpO2: 100%  Weight: 140 lb (63.5 kg)  Height: 5\' 3"  (1.6 m)    PROCEDURE: Endovenous laser ablation left great saphenous vein  TECHNIQUE: The patient was taken to the exam room and placed supine.  I examined the great saphenous vein in the left thigh with the SonoSite I felt like it cannulated in the mid to distal thigh.  The left leg was prepped and draped in usual sterile  fashion.  Under ultrasound guidance, after the skin was anesthetized, I cannulated the great saphenous vein in a longitudinal view under ultrasound guidance.  The micropuncture wire was introduced under ultrasound guidance and the micropuncture sheath introduced over the wire.  I then advanced the straight end of the J-wire up to the saphenofemoral junction.  Next a 45 cm sheath was advanced to approximately 2.8 cm distal to the saphenofemoral junction.  Tumescent anesthesia was then administered circumferentially around the vein.  The laser fiber had been positioned at the end of the sheath and the sheath retracted.  Can position was confirmed such that the laser fiber was approximately 2.5 cm distal to the saphenofemoral junction.  Next the patient was placed in Trendelenburg.  Using laser precautions with eyewear endovenous laser ablation was  performed of the left great saphenous vein from 2-1/2 cm distal to the saphenofemoral junction to the junction of the mid and distal thigh.  1009 J of energy were used over a distance of 23 cm.  A sterile dressing was applied with pressure.  Patient tolerated the procedure well.  He will return in 1 week for a follow-up duplex.  Deitra Mayo [facility] [phone]

## 2019-09-26 NOTE — Progress Notes (Signed)
     Laser Ablation Procedure    Date: 09/26/2019   Jonathan [name] DOB:Jun 29, 1960  Consent signed: Yes   Surgeon: Gae Gallop MD  Procedure: Laser Ablation: left Greater Saphenous Vein  BP 104/74 (BP Location: Left Arm, Patient Position: Sitting, Cuff Size: Normal)   Pulse (!) 110   Temp 97.9 F (36.6 C) (Temporal)   Resp 16   Ht 5\' 3"  (1.6 m)   Wt 140 lb (63.5 kg)   SpO2 100%   BMI 24.80 kg/m   Tumescent Anesthesia: 400 cc 0.9% NaCl with 50 cc Lidocaine HCL 1%  and 15 cc 8.4% NaHCO3  Local Anesthesia: 3 cc Lidocaine HCL and NaHCO3 (ratio 2:1)  7 watts continuous mode     Total energy: 1009 Joules   Total time: 144 seconds Treatment Length 23 cm  Laser Fiber Ref. # [id number]    Lot # [id number]     Patient tolerated procedure well  Notes: Patient wore face mask.  All staff members wore facial masks and facial shields/goggles.    Description of Procedure:  After marking the course of the secondary varicosities, the patient was placed on the operating table in the supine position, and the left leg was prepped and draped in sterile fashion.   Local anesthetic was administered and under ultrasound guidance the saphenous vein was accessed with a micro needle and guide wire; then the mirco puncture sheath was placed.  A guide wire was inserted saphenofemoral junction , followed by a 5 french sheath.  The position of the sheath and then the laser fiber below the junction was confirmed using the ultrasound.  Tumescent anesthesia was administered along the course of the saphenous vein using ultrasound guidance. The patient was placed in Trendelenburg position and protective laser glasses were placed on patient and staff, and the laser was fired at 7 watts continuous mode for a total of 1009 joules.      Steri strip was applied to the IV insertion site and ABD pads and Ace wrap were applied over the left thigh and at the top of the saphenofemoral junction. Blood loss was less  than 15 cc.  Discharge instructions reviewed with patient and hardcopy of discharge instructions given to patient to take home. The patient ambulated out of the operating room on crutches having tolerated the procedure well.

## 2019-09-27 DIAGNOSIS — S91302A Unspecified open wound, left foot, initial encounter: Secondary | ICD-10-CM | POA: Insufficient documentation

## 2019-09-27 DIAGNOSIS — M5136 Other intervertebral disc degeneration, lumbar region: Secondary | ICD-10-CM | POA: Insufficient documentation

## 2019-09-27 DIAGNOSIS — M51369 Other intervertebral disc degeneration, lumbar region without mention of lumbar back pain or lower extremity pain: Secondary | ICD-10-CM | POA: Insufficient documentation

## 2019-09-27 DIAGNOSIS — S91301A Unspecified open wound, right foot, initial encounter: Secondary | ICD-10-CM | POA: Insufficient documentation

## 2019-09-27 HISTORY — PX: TRANSTHORACIC ECHOCARDIOGRAM: SHX275

## 2019-10-02 ENCOUNTER — Telehealth: Payer: Self-pay | Admitting: Podiatry

## 2019-10-02 NOTE — Telephone Encounter (Signed)
Hey Dr.Evans [facility] has called to get orders for this pt did not see in pt chart please advise thank you

## 2019-10-03 ENCOUNTER — Ambulatory Visit (INDEPENDENT_AMBULATORY_CARE_PROVIDER_SITE_OTHER): Payer: Medicare Other | Admitting: Vascular Surgery

## 2019-10-03 ENCOUNTER — Other Ambulatory Visit: Payer: Self-pay

## 2019-10-03 ENCOUNTER — Telehealth: Payer: Self-pay | Admitting: *Deleted

## 2019-10-03 ENCOUNTER — Ambulatory Visit (HOSPITAL_COMMUNITY)
Admission: RE | Admit: 2019-10-03 | Discharge: 2019-10-03 | Disposition: A | Discharge status: Home / Self Care | Payer: Medicare Other | Source: Ambulatory Visit | Attending: Surgery | Admitting: Surgery

## 2019-10-03 ENCOUNTER — Encounter: Payer: Self-pay | Admitting: Vascular Surgery

## 2019-10-03 ENCOUNTER — Encounter (HOSPITAL_BASED_OUTPATIENT_CLINIC_OR_DEPARTMENT_OTHER): Payer: Medicare Other | Attending: Internal Medicine | Admitting: Internal Medicine

## 2019-10-03 VITALS — BP 112/75 | HR 90 | Temp 97.9°F | Resp 14 | Ht 63.0 in | Wt 140.0 lb

## 2019-10-03 DIAGNOSIS — E785 Hyperlipidemia, unspecified: Secondary | ICD-10-CM | POA: Insufficient documentation

## 2019-10-03 DIAGNOSIS — Z7901 Long term (current) use of anticoagulants: Secondary | ICD-10-CM | POA: Diagnosis not present

## 2019-10-03 DIAGNOSIS — I252 Old myocardial infarction: Secondary | ICD-10-CM | POA: Insufficient documentation

## 2019-10-03 DIAGNOSIS — L97211 Non-pressure chronic ulcer of right calf limited to breakdown of skin: Secondary | ICD-10-CM | POA: Insufficient documentation

## 2019-10-03 DIAGNOSIS — I872 Venous insufficiency (chronic) (peripheral): Secondary | ICD-10-CM | POA: Insufficient documentation

## 2019-10-03 DIAGNOSIS — L97521 Non-pressure chronic ulcer of other part of left foot limited to breakdown of skin: Secondary | ICD-10-CM | POA: Diagnosis present

## 2019-10-03 DIAGNOSIS — I776 Arteritis, unspecified: Secondary | ICD-10-CM | POA: Insufficient documentation

## 2019-10-03 DIAGNOSIS — I83893 Varicose veins of bilateral lower extremities with other complications: Secondary | ICD-10-CM

## 2019-10-03 DIAGNOSIS — I10 Essential (primary) hypertension: Secondary | ICD-10-CM | POA: Diagnosis not present

## 2019-10-03 DIAGNOSIS — M059 Rheumatoid arthritis with rheumatoid factor, unspecified: Secondary | ICD-10-CM | POA: Diagnosis not present

## 2019-10-03 NOTE — Telephone Encounter (Signed)
[facility] [place]. White, PT would like verbal orders for PT 1 x week for 5 weeks.

## 2019-10-03 NOTE — Telephone Encounter (Signed)
Left message for K. White, PT to call with more information concerning pt and PT.

## 2019-10-03 NOTE — Telephone Encounter (Signed)
I haven't seen this patient since Dec 2020. He has been managed by the [facility]. Orders need to come from them. - Dr. Amalia Hailey

## 2019-10-03 NOTE — Progress Notes (Signed)
Patient name: Jonathan [name] MRN: [id number] DOB: [birth year] Sex: male  REASON FOR VISIT: Follow-up after laser ablation of the left great saphenous vein  HPI: Jonathan [name] is a 59 y.o. male who presented with CEAP C6 venous disease.  He was having Unna boots placed every 2 weeks.  Noninvasive studies showed significant reflux in both great saphenous veins.  In addition he had deep venous reflux.  I recommended staged bilateral great saphenous vein ablations.  He underwent laser ablation of the left great saphenous vein on 09/26/2019.  The vein was ablated from distal to the saphenofemoral junction to the mid and distal thigh.  Comes in for routine follow-up visit.  The patient is doing well.  He has no specific complaints.  He still has his Unna boot on his left leg.  He denies any chest pain or shortness of breath.  CONSERVATIVE TREATMENT: He continues with the Unna boot dressing on the left leg.  VENOUS INTERVENTIONS: We will now plan staged ablation of the right great saphenous vein.  Current Outpatient Medications  Medication Sig Dispense Refill  . acetaminophen (TYLENOL) 325 MG tablet Take 2 tablets (650 mg total) by mouth every 4 (four) hours as needed for headache or mild pain. 30 tablet 0  . aspirin EC 81 MG tablet Take 1 tablet (81 mg total) by mouth daily. 90 tablet 3  . atorvastatin (LIPITOR) 40 MG tablet Take 40 mg by mouth daily.    . cetirizine (ZYRTEC) 10 MG tablet Take 10 mg by mouth daily.     . cyclobenzaprine (FLEXERIL) 5 MG tablet Take 5 mg by mouth every 8 (eight) hours as needed.    . docusate sodium (COLACE) 100 MG capsule Take 1 capsule (100 mg total) by mouth 2 (two) times daily. 30 capsule 0  . fluticasone (FLONASE) 50 MCG/ACT nasal spray Place 1 spray into the nose daily.     Marland Kitchen gabapentin (NEURONTIN) 300 MG capsule Take 300 mg by mouth 2 (two) times daily.     Marland Kitchen inFLIXimab (REMICADE) 100 MG injection Inject 300 mg into the vein every 6 (six) weeks.     .  isosorbide mononitrate (IMDUR) 60 MG 24 hr tablet Take 1 tablet (60 mg total) by mouth daily. 30 tablet 0  . meloxicam (MOBIC) 15 MG tablet Take 15 mg by mouth daily.    . montelukast (SINGULAIR) 10 MG tablet Take 10 mg by mouth at bedtime.     Marland Kitchen omeprazole (PRILOSEC) 20 MG capsule Take 20 mg by mouth daily.     . ondansetron (ZOFRAN) 4 MG tablet Take 1 tablet (4 mg total) by mouth every 6 (six) hours as needed for nausea. 20 tablet 0  . oxyCODONE (ROXICODONE) 5 MG immediate release tablet Take 0.5 tablets (2.5 mg total) by mouth every 4 (four) hours as needed for severe pain. 3 tablet 0  . predniSONE (DELTASONE) 5 MG tablet Take 5 mg by mouth 2 (two) times daily.    . traMADol (ULTRAM) 50 MG tablet Take 1 tablet (50 mg total) by mouth every 8 (eight) hours as needed (mild pain). 20 tablet 0   No current facility-administered medications for this visit.   REVIEW OF SYSTEMS: Valu.Nieves ] denotes positive finding; [  ] denotes negative finding  CARDIOVASCULAR:  [ ]  chest pain   [ ]  dyspnea on exertion  [ ]  leg swelling  CONSTITUTIONAL:  [ ]  fever   [ ]  chills  PHYSICAL EXAM: Vitals:   10/03/19 1059  BP: 112/75  Pulse: 90  Resp: 14  Temp: 97.9 F (36.6 C)  TempSrc: Temporal  SpO2: 100%  Weight: 140 lb (63.5 kg)  Height: 5\' 3"  (1.6 m)   GENERAL: The patient is a well-nourished male, in no acute distress. The vital signs are documented above. CARDIOVASCULAR: There is a regular rate and rhythm. PULMONARY: There is good air exchange bilaterally without wheezing or rales. VASCULAR: He has no significant bruising in the left thigh.  His incision is healing nicely.  DATA:  VENOUS DUPLEX: I have independently interpreted his venous duplex scan today.  There is no evidence of DVT.  The left great saphenous vein is successfully closed from 1.7 cm distal to the saphenofemoral junction to the junction of the mid and distal third of the thigh.  MEDICAL ISSUES:  CEAP C6 VENOUS DISEASE: The patient is  now undergone laser ablation of the left great saphenous vein with a good result.  He will continue his dressing changes to the left leg at the wound care center.  He is having Unna boots done.  We will schedule him for staged laser ablation of the right great saphenous vein in the near future.  Deitra Mayo [facility] [place] [phone]

## 2019-10-04 ENCOUNTER — Other Ambulatory Visit: Payer: Self-pay | Admitting: *Deleted

## 2019-10-04 DIAGNOSIS — I83813 Varicose veins of bilateral lower extremities with pain: Secondary | ICD-10-CM

## 2019-10-07 NOTE — Progress Notes (Signed)
ARIHANT, PENNINGS ([id number]) Visit Report for 10/03/2019 Debridement Details Patient Name: Date of Service: [place], [place] 10/03/2019 1:45 PM Medical Record Number: [id number] Patient Account Number: [id number] Date of Birth/Sex: Treating RN: 1961-01-11 (59 y.o. [name] Primary Care Provider: Jeani Hawking [place] Other Clinician: Referring Provider: Treating Provider/Extender: Stefanie Libel, LO RI Weeks in Treatment: 38 Debridement Performed for Assessment: Wound #5 Left,Dorsal Foot Performed By: Physician Ricard Dillon., MD Debridement Type: Debridement Level of Consciousness (Pre-procedure): Awake and Alert Pre-procedure Verification/Time Out Yes - 14:00 Taken: Start Time: 14:01 Pain Control: Lidocaine 4% T opical Solution T Area Debrided (L x W): otal 2.4 (cm) x 1.3 (cm) = 3.12 (cm) Tissue and other material debrided: Viable, Non-Viable, Subcutaneous, Skin: Dermis , Fibrin/Exudate Level: Skin/Subcutaneous Tissue Debridement Description: Excisional Instrument: Curette Bleeding: Minimum Hemostasis Achieved: Pressure End Time: 14:06 Procedural Pain: 0 Post Procedural Pain: 0 Response to Treatment: Procedure was tolerated well Level of Consciousness (Post- Awake and Alert procedure): Post Debridement Measurements of Total Wound Length: (cm) 2.4 Width: (cm) 1.3 Depth: (cm) 0.1 Volume: (cm) 0.245 Character of Wound/Ulcer Post Debridement: Improved Post Procedure Diagnosis Same as Pre-procedure Electronic Signature(s) Signed: 10/03/2019 5:25:48 PM By: Deon Pilling Signed: 10/07/2019 8:32:12 AM By: Linton Ham MD Entered By: Linton Ham on 10/03/2019 14:26:56 -------------------------------------------------------------------------------- HPI Details Patient Name: Date of Service: [facility] [place], [place] RL[place] N 10/03/2019 1:45 PM Medical Record Number: [id number] Patient Account Number: [id number] Date of Birth/Sex: Treating RN: 02-05-1961 (59 y.o. [name] Primary Care Provider: Jeani Hawking [place] Other Clinician: Referring Provider: Treating Provider/Extender: Stefanie Libel, LO RI Weeks in Treatment: 41 History of Present Illness HPI Description: ADMISSION 01/10/2019 This is a 59 year old man who has rheumatoid arthritis on Remicade. He is not a diabetic. He had forefoot surgery by Dr. Bettye Boeck on June 25 for correction of hammertoes. At some point in the postop follow-up he presented with multiple wounds on the left foot. I do not really have a good description at this point but I will try to look through [facility] health link. He had arterial studies on 11/28/2018 that were within normal limits. Seen by podiatry on 8/24 with multiple left foot and lower extremity ulcers. He was treated with Bactrim and Cipro. He was hospitalized from 8/28 through 8/30 with wounds on the left anterior foot. An MRI was negative at that point the wounds were on the left anterior and left medial foot. Felt to have cellulitis. He saw his primary doctor in follow-up on 9/3 and felt to have pressure ulcers on the foot. The patient states that he developed these after some acute swelling last month. This may have been a result of infection. He comes in today with multiple wounds on his toes dorsal feet and distal lower extremities anteriorly. The exact cause of this is not completely clear. He does not have a prior wound history. He has multiple lower extremity wounds including the left medial foot, left dorsal foot, left second toe, left anterior mid tibia, right dorsal foot, right fourth toe medially and the right medial lower leg. The areas on the left medial foot and left dorsal foot are large superficial wounds most of the rest of these are small punched out looking areas. He does not complain of a lot of pain Past medical history; hypertension, rheumatoid arthritis on Remicade infusions, hyperlipidemia right forefoot surgery on 6/25, he is on Xarelto  at this point for reasons that are not totally clear. Arterial studies on 11/28/2018 showed  an ABI of 0.98 on the right 1.01 on the left. TBI's of 0.79 on the right 0.75 on the left and triphasic waveforms bilaterally 9/24; wounds on his bilateral lower feet and lower legs. We have been using silver collagen on the wounds. 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 Follows with Dr. Raliegh Ip" rheumatology at [facility]. 10/1; patient with wounds on his bilateral lower feet and lower legs we have been using silver collagen. 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 He does not have macrovascular disease by previous noninvasive studies in August. 10/8; patient with wounds on his bilateral lower feet and legs. Small punched out areas. I have no doubt he has chronic venous disease however these wounds do not look like classic venous ulcers. He is generally been making good improvements. 10/15; patients with small wounds on his bilateral lower legs. He has nothing left on the right 3 wounds on the left are making good progress. On the left we have the left medial foot, left anterior tibial area and the larger area on the dorsal left foot in close proximity to the second and third toes. All of these look a lot better. The patient has clear evidence of chronic venous insufficiency although these wounds did not look like venous insufficiency wounds 10/22; patient has no wounds left on the right. He has 3 wounds on the left arm making good progress. Left medial foot left anterior tibia and the left dorsal foot. These are likely related to venous insufficiency. He has dark fibrotic adherent skin in his bilateral ankle areas. 11/5; still no open wounds on the right. Left medial foot and  the left dorsal foot just proximal to the toes of the 2 remaining wounds. The area on the left anterior tibia is healed 11/19; 2-week follow-up. The area on the left medial foot is closed. The only remaining wound is on the left dorsal foot just proximal to the toes. The area on the left anterior tibia also had been previously healed. The patient sees his rheumatologist in early December. I asked him to mention the wounds to the rheumatologist. I had some thoughts about this being rheumatoid vasculitic related and was going to biopsy this although he seems to have done nicely with conservative treatment. The patient has his compression stocking 12/10; 3-week follow-up. He was seen by our nurses in the interim. The only thing he had last time he was here was the opening on the dorsal foot just proximal to the toes. He was wearing a stocking on the right leg. He says in the last week he developed open areas on the medial ankle and medial calf on the right. 05/02/2019; almost 4-week follow-up. He has the original wound in the left dorsal foot which is not a lot better certainly not worse. He had new wounds last time on the right medial ankle medial calf area and on the dorsal fourth toe today.. The latter is the new wound today. The patient probably has chronic venous insufficiency one would have to wonder about rheumatoid vasculitis and if these keep on forming I probably will biopsy one 1/21; the patient has a new wound on the right third toe which probes to bone. We still do not have an exact diagnosis of this. I went ahead and biopsied the  right medial leg and the left dorsal foot these were shave biopsieso Vasculitis 2/4; biopsies I did of the right medial lower leg and left dorsal foot showed stasis dermatitis without evidence of vasculitis. Special stain was negative for fungus. Fite stain was negative for Mycobacterium. We have been using Hydrofera Blue on the wounds on his legs and endoform to the  right third toe. Everything looks a little better today 3/4; since the patient was last here he had his reflux studies. These showed no evidence of DVT On the right there was reflux in the right greater saphenous . vein right common femoral vein right saphenofemoral junction right popliteal vein. 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 On the left no evidence of a DVT there is no evidence of superficial venous reflux seen in the left small saphenous vein there was venous reflux noted in the left common femoral vein the left greater saphenous vein in the proximal thigh. I did not see much description of venous reflux in the lower extremities although he clearly has chronic venous changes in the skin in his lower legs. He also saw rheumatology at [facility] on 06/12/2019. They noted the nonhealing wounds in his lower extremities. Because of this they held his Remicade infusions. Started him on prednisone 10 mg a day I believe 5 twice daily. He was to return in 2 months. They did document normal arterial studies in August, the biopsy in 06/16/2022 was negative for vasculitis fungi or Mycobacterium. His findings were suggestive of stasis dermatitis He has multiple small wounds bilaterally in color including the right medial lower extremity, right third toe, left medial lower leg left dorsal foot. He has a new area on the right medial calf. These are all very small punched out areas that do not have the appearance of venous wounds. As noted previous biopsy we did in this clinic was negative for vasculitis/rheumatoid vasculitis 3/18; patient is due to see Dr. Donnetta Hutching about reflux studies on 3/30. All the wounds on his legs and ankles are healed. He still has a fairly sizable area on the left dorsal foot just proximal to the base of the second toe. 4/8; the patient was referred to Dr. Doren Custard by Dr. Donnetta Hutching. Dixon saw him on 4/7 which is yesterday. He is a candidate for staged laser ablation of the left  greater saphenous vein and then the right greater saphenous vein. Apparently this is planned for June 5 on the left. We have been using silver alginate compressing him bilaterally. He ordered stockings from [facility] for some reason they have not come in yet 09/19/19-Patient is here after a month and a half was admitted for non-STEMI on 4/28 and underwent revascularization of RCA Patient has healed his right second toe has the left lower extremity wound that is the same 6/10; but since the patient was last here he went for his venous ablation of the left greater saphenous vein on 6/3. This was done by Dr. Doren Custard. He tolerated this well. We are still dealing with an area on the left dorsal foot and his right medial knee. We have been using silver alginate however I changed to silver collagen today. We ordered stockings for him including apparently juxta lite stockings however he did not come with anything on his legs today. Electronic Signature(s) Signed: 10/07/2019 8:32:12 AM By: Linton Ham MD Entered By: Linton Ham on 10/03/2019 14:27:54 -------------------------------------------------------------------------------- Physical Exam Details Patient Name: Date of Service: [facility] [place] 10/03/2019 1:45 PM  Medical Record Number: [id number] Patient Account Number: [id number] Date of Birth/Sex: Treating RN: 13-Feb-1961 (59 y.o. [name] Primary Care Provider: Jeani Hawking [place] Other Clinician: Referring Provider: Treating Provider/Extender: Stefanie Libel, LO RI Weeks in Treatment: 60 Constitutional Sitting or standing Blood Pressure is within target range for patient.. Pulse regular and within target range for patient.Marland Kitchen Respirations regular, non-labored and within target range.. Temperature is normal and within the target range for the patient.Marland Kitchen Appears in no distress. Notes Wound exam; left dorsal foot wound. This is a small circular wound just proximal to the left  second toe. Debrided with a #3 curette very gritty necrotic surface this now has some depth post debridement but the surface of the wound looks better Electronic Signature(s) Signed: 10/07/2019 8:32:12 AM By: Linton Ham MD Entered By: Linton Ham on 10/03/2019 14:28:43 -------------------------------------------------------------------------------- Physician Orders Details Patient Name: Date of Service: [facility] [place] 10/03/2019 1:45 PM Medical Record Number: [id number] Patient Account Number: [id number] Date of Birth/Sex: Treating RN: 04/07/1961 (59 y.o. [name] Primary Care Provider: Jeani Hawking [place] Other Clinician: Referring Provider: Treating Provider/Extender: Stefanie Libel, LO RI Weeks in Treatment: 51 Verbal / Phone Orders: No Diagnosis Coding ICD-10 Coding Code Description L97.521 Non-pressure chronic ulcer of other part of left foot limited to breakdown of skin I87.331 Chronic venous hypertension (idiopathic) with ulcer and inflammation of right lower extremity M05.80 Other rheumatoid arthritis with rheumatoid factor of unspecified site L97.211 Non-pressure chronic ulcer of right calf limited to breakdown of skin Follow-up Appointments ppointment in 2 weeks. - Thursday Return A Dressing Change Frequency Change dressing three times week. - twice a week by home health. Skin Barriers/Peri-Wound Care TCA Cream or Ointment - liberally to left leg and all wounds in clinic and at home by home health. patient to apply lotion to right leg nightly before bed. Wound Cleansing May shower and wash wound with soap and water. - with dressing changes. Primary Wound Dressing Wound #13 Left,Lateral Knee Silver Collagen - moisten with hydrogel. Wound #5 Left,Dorsal Foot Silver Collagen - moisten with hydrogel. Secondary Dressing Wound #5 Left,Dorsal Foot Dry Gauze Wound #13 Left,Lateral Knee Foam Border Edema Control 3 Layer Compression System - Left  Lower Extremity Avoid standing for long periods of time Elevate legs to the level of the heart or above for 30 minutes daily and/or when sitting, a frequency of: - throughout the day. Support Garment 20-30 mm/Hg pressure to: - patient to apply jobst compression stocking right leg in the morning and remove at night. ***home health please help patient apply compression stocking.*** [facility] for wound care. - [facility]. Electronic Signature(s) Signed: 10/03/2019 5:25:48 PM By: Deon Pilling Signed: 10/07/2019 8:32:12 AM By: Linton Ham MD Entered By: Deon Pilling on 10/03/2019 14:12:00 -------------------------------------------------------------------------------- Problem List Details Patient Name: Date of Service: [facility] [place] 10/03/2019 1:45 PM Medical Record Number: [id number] Patient Account Number: [id number] Date of Birth/Sex: Treating RN: 1960/10/18 (58 y.o. [name] Primary Care Provider: Jeani Hawking [place] Other Clinician: Referring Provider: Treating Provider/Extender: Stefanie Libel, LO RI Weeks in Treatment: 57 Active Problems ICD-10 Encounter Code Description Active Date MDM Diagnosis L97.521 Non-pressure chronic ulcer of other part of left foot limited to breakdown of 01/10/2019 No Yes skin I87.331 Chronic venous hypertension (idiopathic) with ulcer and inflammation of right 01/10/2019 No Yes lower extremity M05.80 Other rheumatoid arthritis with rheumatoid factor of unspecified site 01/10/2019 No Yes L97.211 Non-pressure chronic ulcer  of right calf limited to breakdown of skin 04/04/2019 No Yes Inactive Problems ICD-10 Code Description Active Date Inactive Date L97.821 Non-pressure chronic ulcer of other part of left lower leg limited to breakdown of skin 01/10/2019 01/10/2019 L97.511 Non-pressure chronic ulcer of other part of right foot limited to breakdown of skin 01/10/2019 01/10/2019 L97.812  Non-pressure chronic ulcer of other part of right lower leg with fat layer exposed 01/10/2019 01/10/2019 L97.311 Non-pressure chronic ulcer of right ankle limited to breakdown of skin 04/04/2019 04/04/2019 Resolved Problems Electronic Signature(s) Signed: 10/07/2019 8:32:12 AM By: Linton Ham MD Entered By: Linton Ham on 10/03/2019 14:26:09 -------------------------------------------------------------------------------- Progress Note Details Patient Name: Date of Service: [facility] [place] 10/03/2019 1:45 PM Medical Record Number: [id number] Patient Account Number: [id number] Date of Birth/Sex: Treating RN: 1961/04/16 (59 y.o. [name] Primary Care Provider: Jeani Hawking [place] Other Clinician: Referring Provider: Treating Provider/Extender: Stefanie Libel, LO RI Weeks in Treatment: 19 Subjective History of Present Illness (HPI) ADMISSION 01/10/2019 This is a 59 year old man who has rheumatoid arthritis on Remicade. He is not a diabetic. He had forefoot surgery by Dr. Bettye Boeck on June 25 for correction of hammertoes. At some point in the postop follow-up he presented with multiple wounds on the left foot. I do not really have a good description at this point but I will try to look through [facility] health link. He had arterial studies on 11/28/2018 that were within normal limits. Seen by podiatry on 8/24 with multiple left foot and lower extremity ulcers. He was treated with Bactrim and Cipro. He was hospitalized from 8/28 through 8/30 with wounds on the left anterior foot. An MRI was negative at that point the wounds were on the left anterior and left medial foot. Felt to have cellulitis. He saw his primary doctor in follow-up on 9/3 and felt to have pressure ulcers on the foot. The patient states that he developed these after some acute swelling last month. This may have been a result of infection. He comes in today with multiple wounds on his toes dorsal feet and distal  lower extremities anteriorly. The exact cause of this is not completely clear. He does not have a prior wound history. He has multiple lower extremity wounds including the left medial foot, left dorsal foot, left second toe, left anterior mid tibia, right dorsal foot, right fourth toe medially and the right medial lower leg. The areas on the left medial foot and left dorsal foot are large superficial wounds most of the rest of these are small punched out looking areas. He does not complain of a lot of pain Past medical history; hypertension, rheumatoid arthritis on Remicade infusions, hyperlipidemia right forefoot surgery on 6/25, he is on Xarelto at this point for reasons that are not totally clear. Arterial studies on 11/28/2018 showed an ABI of 0.98 on the right 1.01 on the left. TBI's of 0.79 on the right 0.75 on the left and triphasic waveforms bilaterally 9/24; wounds on his bilateral lower feet and lower legs. We have been using silver collagen on the wounds. 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 Follows with Dr. Raliegh Ip" rheumatology at [facility]. 10/1; patient with wounds on his bilateral lower feet and lower legs we have been using silver collagen. These are small punched out wounds and although he appears to have chronic venous disease/stasis dermatitis I have also considered rheumatoid  vasculitis. He does not have macrovascular disease by previous noninvasive studies in August. 10/8; patient with wounds on his bilateral lower feet and legs. Small punched out areas. I have no doubt he has chronic venous disease however these wounds do not look like classic venous ulcers. He is generally been making good improvements. 10/15; patients with small wounds on his bilateral lower legs. He has nothing left on the right 3 wounds on the left are making good progress. On the left  we have the left medial foot, left anterior tibial area and the larger area on the dorsal left foot in close proximity to the second and third toes. All of these look a lot better. The patient has clear evidence of chronic venous insufficiency although these wounds did not look like venous insufficiency wounds 10/22; patient has no wounds left on the right. He has 3 wounds on the left arm making good progress. Left medial foot left anterior tibia and the left dorsal foot. These are likely related to venous insufficiency. He has dark fibrotic adherent skin in his bilateral ankle areas. 11/5; still no open wounds on the right. Left medial foot and the left dorsal foot just proximal to the toes of the 2 remaining wounds. The area on the left anterior tibia is healed 11/19; 2-week follow-up. The area on the left medial foot is closed. The only remaining wound is on the left dorsal foot just proximal to the toes. The area on the left anterior tibia also had been previously healed. The patient sees his rheumatologist in early December. I asked him to mention the wounds to the rheumatologist. I had some thoughts about this being rheumatoid vasculitic related and was going to biopsy this although he seems to have done nicely with conservative treatment. The patient has his compression stocking 12/10; 3-week follow-up. He was seen by our nurses in the interim. The only thing he had last time he was here was the opening on the dorsal foot just proximal to the toes. He was wearing a stocking on the right leg. He says in the last week he developed open areas on the medial ankle and medial calf on the right. 05/02/2019; almost 4-week follow-up. He has the original wound in the left dorsal foot which is not a lot better certainly not worse. He had new wounds last time on the right medial ankle medial calf area and on the dorsal fourth toe today.. The latter is the new wound today. The patient probably has chronic  venous insufficiency one would have to wonder about rheumatoid vasculitis and if these keep on forming I probably will biopsy one 1/21; the patient has a new wound on the right third toe which probes to bone. We still do not have an exact diagnosis of this. I went ahead and biopsied the right medial leg and the left dorsal foot these were shave biopsieso Vasculitis 2/4; biopsies I did of the right medial lower leg and left dorsal foot showed stasis dermatitis without evidence of vasculitis. Special stain was negative for fungus. Fite stain was negative for Mycobacterium. We have been using Hydrofera Blue on the wounds on his legs and endoform to the right third toe. Everything looks a little better today 3/4; since the patient was last here he had his reflux studies. These showed no evidence of DVT On the right there was reflux in the right greater saphenous . vein right common femoral vein right saphenofemoral junction right popliteal vein. Chronic thrombus noted in  the small saphenous vein. On the left no evidence of a DVT there is no evidence of superficial venous reflux seen in the left small saphenous vein there was venous reflux noted in the left common femoral vein the left greater saphenous vein in the proximal thigh. I did not see much description of venous reflux in the lower extremities although he clearly has chronic venous changes in the skin in his lower legs. He also saw rheumatology at [facility] on 06/12/2019. They noted the nonhealing wounds in his lower extremities. Because of this they held his Remicade infusions. Started him on prednisone 10 mg a day I believe 5 twice daily. He was to return in 2 months. They did document normal arterial studies in August, the biopsy in 05/29/22 was negative for vasculitis fungi or Mycobacterium. His findings were suggestive of stasis dermatitis He has multiple small wounds bilaterally in color including the right medial lower extremity, right third  toe, left medial lower leg left dorsal foot. He has a new area on the right medial calf. These are all very small punched out areas that do not have the appearance of venous wounds. As noted previous biopsy we did in this clinic was negative for vasculitis/rheumatoid vasculitis 3/18; patient is due to see Dr. Donnetta Hutching about reflux studies on 3/30. All the wounds on his legs and ankles are healed. He still has a fairly sizable area on the left dorsal foot just proximal to the base of the second toe. 4/8; the patient was referred to Dr. Doren Custard by Dr. Donnetta Hutching. Dixon saw him on 4/7 which is yesterday. He is a candidate for staged laser ablation of the left greater saphenous vein and then the right greater saphenous vein. Apparently this is planned for June 5 on the left. We have been using silver alginate compressing him bilaterally. He ordered stockings from [facility] for some reason they have not come in yet 09/19/19-Patient is here after a month and a half was admitted for non-STEMI on 4/28 and underwent revascularization of RCA Patient has healed his right second toe has the left lower extremity wound that is the same 6/10; but since the patient was last here he went for his venous ablation of the left greater saphenous vein on 6/3. This was done by Dr. Doren Custard. He tolerated this well. We are still dealing with an area on the left dorsal foot and his right medial knee. We have been using silver alginate however I changed to silver collagen today. We ordered stockings for him including apparently juxta lite stockings however he did not come with anything on his legs today. Objective Constitutional Sitting or standing Blood Pressure is within target range for patient.. Pulse regular and within target range for patient.Marland Kitchen Respirations regular, non-labored and within target range.. Temperature is normal and within the target range for the patient.Marland Kitchen Appears in no distress. Vitals Time Taken: 1:30 PM, Height:  63 in, Weight: 162 lbs, BMI: 28.7, Temperature: 98.4 F, Pulse: 93 bpm, Respiratory Rate: 19 breaths/min, Blood Pressure: 118/74 mmHg. General Notes: Wound exam; left dorsal foot wound. This is a small circular wound just proximal to the left second toe. Debrided with a #3 curette very gritty necrotic surface this now has some depth post debridement but the surface of the wound looks better oo Integumentary (Hair, Skin) Wound #13 status is Open. Original cause of wound was Gradually Appeared. The wound is located on the Left,Lateral Knee. The wound measures 1.2cm length x 1.1cm width x 0.1cm depth;  1.037cm^2 area and 0.104cm^3 volume. There is no tunneling or undermining noted. There is a small amount of serosanguineous drainage noted. The wound margin is distinct with the outline attached to the wound base. There is small (1-33%) pink granulation within the wound bed. There is a large (67-100%) amount of necrotic tissue within the wound bed including Eschar and Adherent Slough. Wound #5 status is Open. Original cause of wound was Gradually Appeared. The wound is located on the Left,Dorsal Foot. The wound measures 2.4cm length x 1.3cm width x 0.1cm depth; 2.45cm^2 area and 0.245cm^3 volume. Assessment Active Problems ICD-10 Non-pressure chronic ulcer of other part of left foot limited to breakdown of skin Chronic venous hypertension (idiopathic) with ulcer and inflammation of right lower extremity Other rheumatoid arthritis with rheumatoid factor of unspecified site Non-pressure chronic ulcer of right calf limited to breakdown of skin Procedures Wound #5 Pre-procedure diagnosis of Wound #5 is an Inflammatory located on the Left,Dorsal Foot . There was a Excisional Skin/Subcutaneous Tissue Debridement with a total area of 3.12 sq cm performed by Ricard Dillon., MD. With the following instrument(s): Curette to remove Viable and Non-Viable tissue/material. Material removed includes  Subcutaneous Tissue, Skin: Dermis, and Fibrin/Exudate after achieving pain control using Lidocaine 4% T opical Solution. A time out was conducted at 14:00, prior to the start of the procedure. A Minimum amount of bleeding was controlled with Pressure. The procedure was tolerated well with a pain level of 0 throughout and a pain level of 0 following the procedure. Post Debridement Measurements: 2.4cm length x 1.3cm width x 0.1cm depth; 0.245cm^3 volume. Character of Wound/Ulcer Post Debridement is improved. Post procedure Diagnosis Wound #5: Same as Pre-Procedure Pre-procedure diagnosis of Wound #5 is an Inflammatory located on the Left,Dorsal Foot . There was a Three Layer Compression Therapy Procedure by Baruch Gouty, RN. Post procedure Diagnosis Wound #5: Same as Pre-Procedure Plan Follow-up Appointments: Return Appointment in 2 weeks. - Thursday Dressing Change Frequency: Change dressing three times week. - twice a week by home health. Skin Barriers/Peri-Wound Care: TCA Cream or Ointment - liberally to left leg and all wounds in clinic and at home by home health. patient to apply lotion to right leg nightly before bed. Wound Cleansing: May shower and wash wound with soap and water. - with dressing changes. Primary Wound Dressing: Wound #13 Left,Lateral Knee: Silver Collagen - moisten with hydrogel. Wound #5 Left,Dorsal Foot: Silver Collagen - moisten with hydrogel. Secondary Dressing: Wound #5 Left,Dorsal Foot: Dry Gauze Wound #13 Left,Lateral Knee: Foam Border Edema Control: 3 Layer Compression System - Left Lower Extremity Avoid standing for long periods of time Elevate legs to the level of the heart or above for 30 minutes daily and/or when sitting, a frequency of: - throughout the day. Support Garment 20-30 mm/Hg pressure to: - patient to apply jobst compression stocking right leg in the morning and remove at night. ***home health please help patient apply compression  stocking.*** Home Health: [facility]. - [facility]. 1. I change the primary dressing to silver collagen post debridement. 2. Also the area on the left lateral knee 3. We are trying to get him to come in with stockings. He did not have a stocking on either leg today although we are wrapping the one on the left Electronic Signature(s) Signed: 10/07/2019 8:32:12 AM By: Linton Ham MD Entered By: Linton Ham on 10/03/2019 14:29:40 -------------------------------------------------------------------------------- SuperBill Details Patient Name: Date of Service: [facility] [place]  10/03/2019 Medical Record Number: [id number] Patient Account Number: [id number] Date of Birth/Sex: Treating RN: 1960-07-14 (59 y.o. [name] Primary Care Provider: Jeani Hawking [place] Other Clinician: Referring Provider: Treating Provider/Extender: Stefanie Libel, LO RI Weeks in Treatment: 38 Diagnosis Coding ICD-10 Codes Code Description [phone] Non-pressure chronic ulcer of other part of left foot limited to breakdown of skin I87.331 Chronic venous hypertension (idiopathic) with ulcer and inflammation of right lower extremity M05.80 Other rheumatoid arthritis with rheumatoid factor of unspecified site L97.211 Non-pressure chronic ulcer of right calf limited to breakdown of skin Facility Procedures The patient participates with Medicare or their insurance follows the Medicare Facility Guidelines: CPT4 Code Description Modifier Quantity [id number] 11042 - DEB SUBQ TISSUE 20 SQ CM/< 1 ICD-10 Diagnosis Description L97.521 Non-pressure chronic ulcer of  other part of left foot limited to breakdown of skin Physician Procedures Electronic Signature(s) Signed: 10/07/2019 8:32:12 AM By: Linton Ham MD Entered By: Linton Ham on 10/03/2019 14:30:19

## 2019-10-07 NOTE — Progress Notes (Signed)
Jonathan [name], Jonathan [name] ([id number]) Visit Report for 10/03/2019 Arrival Information Details Patient Name: Date of Service: [place], [place] 10/03/2019 1:45 PM Medical Record Number: [id number] Patient Account Number: [id number] Date of Birth/Sex: Treating RN: 1960/07/06 (59 y.o. Jonathan [name] Primary Care Jonathan [name]: Jonathan [name] Other Clinician: Referring Jonathan [name]: Treating Jonathan [name]/Extender: Jonathan [name], LO RI Weeks in Treatment: 72 Visit Information History Since Last Visit Added or deleted any medications: No Patient Arrived: Crutches Any new allergies or adverse reactions: No Arrival Time: 13:38 Had a fall or experienced change in No Accompanied By: self activities of daily living that may affect Transfer Assistance: None risk of falls: Patient Identification Verified: Yes Signs or symptoms of abuse/neglect since No Secondary Verification Process Completed: Yes last visito Patient Requires Transmission-Based Precautions: No Hospitalized since last visit: No Patient Has Alerts: Yes Implantable device outside of the clinic No Patient Alerts: Patient on Blood Thinner excluding ABI Right .98 cellular tissue based products placed in the ABI Left 1.01 center since last visit: Has Dressing in Place as Prescribed: Yes Has Compression in Place as Prescribed: Yes Has Footwear/Offloading in Place as Yes Prescribed: Left: Surgical Shoe with Pressure Relief Insole Right: Surgical Shoe with Pressure Relief Insole Pain Present Now: No Electronic Signature(s) Signed: 10/03/2019 4:59:25 PM By: Kela Millin Entered By: Kela Millin on 10/03/2019 13:38:27 -------------------------------------------------------------------------------- Compression Therapy Details Patient Name: Date of Service: [facility] [place] 10/03/2019 1:45 PM Medical Record Number: [id number] Patient Account Number: [id number] Date of Birth/Sex: Treating RN: July 12, 1960 (59 y.o. Jonathan [name] Primary Care Jonathan [name]: Jonathan [name] Other Clinician: Referring Kennedey Digilio: Treating Maurianna Benard/Extender: Jonathan [name], LO RI Weeks in Treatment: 38 Compression Therapy Performed for Wound Assessment: Wound #5 Left,Dorsal Foot Performed By: Clinician Baruch Gouty, RN Compression Type: Three Layer Post Procedure Diagnosis Same as Pre-procedure Electronic Signature(s) Signed: 10/03/2019 5:25:48 PM By: Deon Pilling Entered By: Deon Pilling on 10/03/2019 14:10:06 -------------------------------------------------------------------------------- Encounter Discharge Information Details Patient Name: Date of Service: [facility] [place] 10/03/2019 1:45 PM Medical Record Number: [id number] Patient Account Number: [id number] Date of Birth/Sex: Treating RN: [birth year] (59 y.o. Jonathan [name] Primary Care Jonathan [name]: Tamala Julian, [place] [name] Other Clinician: Referring Ramces Shomaker: Treating Dontavius Keim/Extender: Jonathan [name], LO RI Weeks in Treatment: 38 Encounter Discharge Information Items Post Procedure Vitals Discharge Condition: Stable Temperature (F): 98.4 Ambulatory Status: Crutches Pulse (bpm): 93 Discharge Destination: Home Respiratory Rate (breaths/min): 18 Transportation: Other Blood Pressure (mmHg): 118/74 Accompanied By: self Schedule Follow-up Appointment: Yes Clinical Summary of Care: Patient Declined Notes transportation service Electronic Signature(s) Signed: 10/03/2019 5:22:56 PM By: Baruch Gouty RN, BSN Entered By: Baruch Gouty on 10/03/2019 14:35:44 -------------------------------------------------------------------------------- Lower Extremity Assessment Details Patient Name: Date of Service: [facility] [place] 10/03/2019 1:45 PM Medical Record Number: [id number] Patient Account Number: [id number] Date of Birth/Sex: Treating RN: Aug 24, 1960 (59 y.o. Jonathan [name] Primary Care Breyer Tejera: Jonathan [name] Other Clinician: Referring  [name]: Treating Cerys Winget/Extender: Jonathan [name], LO RI Weeks in Treatment: 38 Edema Assessment Assessed: [Left: No] [Right: No] Edema: [Left: Ye] [Right: s] Calf Left: Right: Point of Measurement: 36 cm From Medial Instep 29 cm cm Ankle Left: Right: Point of Measurement: 8 cm From Medial Instep 21.2 cm cm Vascular Assessment Pulses: Dorsalis Pedis Palpable: [Left:Yes] Electronic Signature(s) Signed: 10/03/2019 4:59:25 PM By: Kela Millin Entered By: Kela Millin on 10/03/2019 13:39:14 -------------------------------------------------------------------------------- Multi Wound Chart Details Patient Name: Date of Service: [facility] [place] 10/03/2019 1:45 PM Medical Record Number: [id number] Patient Account  Number: [id number] Date of Birth/Sex: Treating RN: 01/27/1961 (59 y.o. Jonathan [name] Primary Care Lamika Connolly: Tamala Julian, [place] [name] Other Clinician: Referring Chaselyn Nanney: Treating Sherica Paternostro/Extender: Jonathan [name], LO RI Weeks in Treatment: 13 Vital Signs Height(in): 63 Pulse(bpm): 93 Weight(lbs): 162 Blood Pressure(mmHg): 118/74 Body Mass Index(BMI): 29 Temperature(F): 98.4 Respiratory Rate(breaths/min): 19 Photos: [13:No Photos Left, Lateral Knee] [5:No Photos Left, Dorsal Foot] [N/A:N/A N/A] Wound Location: [13:Gradually Appeared] [5:Gradually Appeared] [N/A:N/A] Wounding Event: [13:Inflammatory] [5:Inflammatory] [N/A:N/A] Primary Etiology: [13:Rheumatoid Arthritis, Osteoarthritis] [5:N/A] [N/A:N/A] Comorbid History: [13:10/03/2019] [5:11/24/2018] [N/A:N/A] Date Acquired: [13:0] [5:38] [N/A:N/A] Weeks of Treatment: [13:Open] [5:Open] [N/A:N/A] Wound Status: [13:1.2x1.1x0.1] [5:2.4x1.3x0.1] [N/A:N/A] Measurements L x W x D (cm) [13:1.037] [5:2.45] [N/A:N/A] A (cm) : rea [13:0.104] [5:0.245] [N/A:N/A] Volume (cm) : [13:N/A] [5:74.30%] [N/A:N/A] % Reduction in A rea: [13:N/A] [5:74.30%] [N/A:N/A] % Reduction in Volume: [13:Full Thickness  Without Exposed] [5:Full Thickness Without Exposed] [N/A:N/A] Classification: [13:Support Structures Small] [5:Support Structures N/A] [N/A:N/A] Exudate A mount: [13:Serosanguineous] [5:N/A] [N/A:N/A] Exudate Type: [13:red, brown] [5:N/A] [N/A:N/A] Exudate Color: [13:Distinct, outline attached] [5:N/A] [N/A:N/A] Wound Margin: [13:Small (1-33%)] [5:N/A] [N/A:N/A] Granulation A mount: [13:Pink] [5:N/A] [N/A:N/A] Granulation Quality: [13:Large (67-100%)] [5:N/A] [N/A:N/A] Necrotic A mount: [13:Eschar, Adherent Slough] [5:N/A] [N/A:N/A] Necrotic Tissue: [13:Fascia: No] [5:N/A] [N/A:N/A] Exposed Structures: [13:Fat Layer (Subcutaneous Tissue) Exposed: No Tendon: No Muscle: No Joint: No Bone: No None] [5:N/A] [N/A:N/A] Epithelialization: [13:N/A] [5:Debridement - Excisional] [N/A:N/A] Debridement: Pre-procedure Verification/Time Out N/A [5:14:00] [N/A:N/A] Taken: [13:N/A] [5:Lidocaine 4% Topical Solution] [N/A:N/A] Pain Control: [13:N/A] [5:Subcutaneous] [N/A:N/A] Tissue Debrided: [13:N/A] [5:Skin/Subcutaneous Tissue] [N/A:N/A] Level: [13:N/A] [5:3.12] [N/A:N/A] Debridement A (sq cm): [13:rea N/A] [5:Curette] [N/A:N/A] Instrument: [13:N/A] [5:Minimum] [N/A:N/A] Bleeding: [13:N/A] [5:Pressure] [N/A:N/A] Hemostasis A chieved: [13:N/A] [5:0] [N/A:N/A] Procedural Pain: [13:N/A] [5:0] [N/A:N/A] Post Procedural Pain: [13:N/A] [5:Procedure was tolerated well] [N/A:N/A] Debridement Treatment Response: [13:N/A] [5:2.4x1.3x0.1] [N/A:N/A] Post Debridement Measurements L x W x D (cm) [13:N/A] [5:0.245] [N/A:N/A] Post Debridement Volume: (cm) [13:N/A] [5:Compression Therapy] [N/A:N/A] Procedures Performed: [5:Debridement] Treatment Notes Electronic Signature(s) Signed: 10/03/2019 5:25:48 PM By: Deon Pilling Signed: 10/07/2019 8:32:12 AM By: Linton Ham MD Entered By: Linton Ham on 10/03/2019  14:26:28 -------------------------------------------------------------------------------- Multi-Disciplinary Care Plan Details Patient Name: Date of Service: [facility] [place] 10/03/2019 1:45 PM Medical Record Number: [id number] Patient Account Number: [id number] Date of Birth/Sex: Treating RN: Mar 11, 1961 (59 y.o. Jonathan [name] Primary Care Aahna Rossa: Jonathan [name] Other Clinician: Referring Shenika Quint: Treating Johnnetta Holstine/Extender: Jonathan [name], LO RI Weeks in Treatment: 59 Active Inactive Nutrition Nursing Diagnoses: Potential for alteratiion in Nutrition/Potential for imbalanced nutrition Goals: Patient/caregiver agrees to and verbalizes understanding of need to obtain nutritional consultation Date Initiated: 01/10/2019 Target Resolution Date: 10/25/2019 Goal Status: Active Interventions: Provide education on nutrition Treatment Activities: Education provided on Nutrition : 09/19/2019 Patient referred to Primary Care Physician for further nutritional evaluation : 01/10/2019 Notes: Electronic Signature(s) Signed: 10/03/2019 5:25:48 PM By: Deon Pilling Entered By: Deon Pilling on 10/03/2019 14:04:33 -------------------------------------------------------------------------------- Pain Assessment Details Patient Name: Date of Service: [facility] [place] 10/03/2019 1:45 PM Medical Record Number: [id number] Patient Account Number: [id number] Date of Birth/Sex: Treating RN: 02/01/1961 (59 y.o. Jonathan [name] Primary Care Shannon Kirkendall: Jonathan [name] Other Clinician: Referring Tresten Pantoja: Treating Maui Britten/Extender: Jonathan [name], LO RI Weeks in Treatment: 60 Active Problems Location of Pain Severity and Description of Pain Patient Has Paino No Site Locations Pain Management and Medication Current Pain Management: Electronic Signature(s) Signed: 10/03/2019 4:59:25 PM By: Kela Millin Entered By: Kela Millin on 10/03/2019  13:38:56 -------------------------------------------------------------------------------- Patient/Caregiver Education Details Patient Name: Date of Service: [facility]  [place] 6/10/2021andnbsp1:45 PM Medical Record Number: [id number] Patient Account Number: [id number] Date of Birth/Gender: Treating RN: 11/05/1960 (59 y.o. Jonathan [name] Primary Care Physician: Jonathan [name] Other Clinician: Referring Physician: Treating Physician/Extender: Jonathan [name], LO RI Weeks in Treatment: 49 Education Assessment Education Provided To: Patient Education Topics Provided Wound/Skin Impairment: Handouts: Skin Care Do's and Dont's Methods: Explain/Verbal Responses: Reinforcements needed Electronic Signature(s) Signed: 10/03/2019 5:25:48 PM By: Deon Pilling Entered By: Deon Pilling on 10/03/2019 14:04:44 -------------------------------------------------------------------------------- Wound Assessment Details Patient Name: Date of Service: [facility] [place] 10/03/2019 1:45 PM Medical Record Number: [id number] Patient Account Number: [id number] Date of Birth/Sex: Treating RN: June 21, 1960 (59 y.o. Jonathan [name] Primary Care Kolleen Ochsner: Tamala Julian, [place] [name] Other Clinician: Referring Tyrice Hewitt: Treating Desma Wilkowski/Extender: Jonathan [name], LO RI Weeks in Treatment: 38 Wound Status Wound Number: 13 Primary Etiology: Inflammatory Wound Location: Left, Lateral Knee Wound Status: Open Wounding Event: Gradually Appeared Comorbid History: Rheumatoid Arthritis, Osteoarthritis Date Acquired: 10/03/2019 Weeks Of Treatment: 0 Clustered Wound: No Wound Measurements Length: (cm) 1.2 Width: (cm) 1.1 Depth: (cm) 0.1 Area: (cm) 1.037 Volume: (cm) 0.104 % Reduction in Area: % Reduction in Volume: Epithelialization: None Tunneling: No Undermining: No Wound Description Classification: Full Thickness Without Exposed Support Structures Wound Margin: Distinct, outline  attached Exudate Amount: Small Exudate Type: Serosanguineous Exudate Color: red, brown Foul Odor After Cleansing: No Slough/Fibrino Yes Wound Bed Granulation Amount: Small (1-33%) Exposed Structure Granulation Quality: Pink Fascia Exposed: No Necrotic Amount: Large (67-100%) Fat Layer (Subcutaneous Tissue) Exposed: No Necrotic Quality: Eschar, Adherent Slough Tendon Exposed: No Muscle Exposed: No Joint Exposed: No Bone Exposed: No Treatment Notes Wound #13 (Left, Lateral Knee) 2. Periwound Care Skin Prep 3. Primary Dressing Applied Collegen AG 4. Secondary Dressing Foam Border Dressing 5. Secured With Recruitment consultant) Signed: 10/03/2019 4:59:25 PM By: Kela Millin Entered By: Kela Millin on 10/03/2019 13:41:14 -------------------------------------------------------------------------------- Wound Assessment Details Patient Name: Date of Service: [facility] [place] 10/03/2019 1:45 PM Medical Record Number: [id number] Patient Account Number: [id number] Date of Birth/Sex: Treating RN: [birth year] (59 y.o. Jonathan [name] Primary Care Greenleigh Kauth: Tamala Julian, [place] [name] Other Clinician: Referring Laniya Friedl: Treating Corazon Nickolas/Extender: Jonathan [name], LO RI Weeks in Treatment: 38 Wound Status Wound Number: 5 Primary Etiology: Inflammatory Wound Location: Left, Dorsal Foot Wound Status: Open Wounding Event: Gradually Appeared Date Acquired: 11/24/2018 Weeks Of Treatment: 38 Clustered Wound: No Wound Measurements Length: (cm) 2.4 Width: (cm) 1.3 Depth: (cm) 0.1 Area: (cm) 2.45 Volume: (cm) 0.245 % Reduction in Area: 74.3% % Reduction in Volume: 74.3% Wound Description Classification: Full Thickness Without Exposed Support Structur es Treatment Notes Wound #5 (Left, Dorsal Foot) 1. Cleanse With Soap and water 2. Periwound Care Moisturizing lotion TCA Ointment 3. Primary Dressing Applied Calcium Alginate Ag 4. Secondary Dressing Dry  Gauze 6. Support Layer Applied 3 layer compression wrap Electronic Signature(s) Signed: 10/03/2019 4:59:25 PM By: Kela Millin Entered By: Kela Millin on 10/03/2019 13:42:17 -------------------------------------------------------------------------------- Vitals Details Patient Name: Date of Service: [facility] [place] 10/03/2019 1:45 PM Medical Record Number: [id number] Patient Account Number: [id number] Date of Birth/Sex: Treating RN: 1960-11-18 (59 y.o. Jonathan [name] Primary Care Vashaun Osmon: Tamala Julian, [place] [name] Other Clinician: Referring Aamilah Augenstein: Treating Amantha Sklar/Extender: Jonathan [name], LO RI Weeks in Treatment: 9 Vital Signs Time Taken: 13:30 Temperature (F): 98.4 Height (in): 63 Pulse (bpm): 93 Weight (lbs): 162 Respiratory Rate (breaths/min): 19 Body Mass Index (BMI): 28.7 Blood Pressure (mmHg): 118/74 Reference Range: 80 - 120 mg / dl Electronic  Signature(s) Signed: 10/03/2019 4:59:25 PM By: Kela Millin Entered By: Kela Millin on 10/03/2019 13:38:51

## 2019-10-17 ENCOUNTER — Encounter (HOSPITAL_BASED_OUTPATIENT_CLINIC_OR_DEPARTMENT_OTHER): Payer: Medicare Other | Admitting: Internal Medicine

## 2019-10-31 ENCOUNTER — Encounter: Payer: Self-pay | Admitting: Vascular Surgery

## 2019-10-31 ENCOUNTER — Ambulatory Visit (INDEPENDENT_AMBULATORY_CARE_PROVIDER_SITE_OTHER): Payer: Medicare Other | Admitting: Vascular Surgery

## 2019-10-31 ENCOUNTER — Other Ambulatory Visit: Payer: Self-pay

## 2019-10-31 VITALS — BP 109/70 | HR 91 | Temp 98.1°F | Resp 18 | Ht 63.0 in | Wt 140.0 lb

## 2019-10-31 DIAGNOSIS — I872 Venous insufficiency (chronic) (peripheral): Secondary | ICD-10-CM

## 2019-10-31 DIAGNOSIS — L97208 Non-pressure chronic ulcer of unspecified calf with other specified severity: Secondary | ICD-10-CM

## 2019-10-31 DIAGNOSIS — I83002 Varicose veins of unspecified lower extremity with ulcer of calf: Secondary | ICD-10-CM | POA: Diagnosis not present

## 2019-10-31 DIAGNOSIS — I83813 Varicose veins of bilateral lower extremities with pain: Secondary | ICD-10-CM

## 2019-10-31 HISTORY — PX: ENDOVENOUS ABLATION SAPHENOUS VEIN W/ LASER: SUR449

## 2019-10-31 NOTE — Progress Notes (Signed)
Patient name: Jonathan [name] MRN: [id number] DOB: Dec 16, 1960 Sex: male  REASON FOR VISIT: For laser ablation of the right great saphenous vein.    HPI: Jonathan [name] is a 59 y.o. male with CEAP C6 venous disease.  His wounds are being treated with Unna boots.  Noninvasive studies showed significant reflux in both great saphenous veins.  In addition he had deep venous reflux.  I recommended staged bilateral great saphenous vein ablations.  He underwent successful laser ablation of the left great saphenous vein on 09/26/2019.   Current Outpatient Medications  Medication Sig Dispense Refill  . acetaminophen (TYLENOL) 325 MG tablet Take 2 tablets (650 mg total) by mouth every 4 (four) hours as needed for headache or mild pain. 30 tablet 0  . aspirin EC 81 MG tablet Take 1 tablet (81 mg total) by mouth daily. 90 tablet 3  . atorvastatin (LIPITOR) 40 MG tablet Take 40 mg by mouth daily.    . cetirizine (ZYRTEC) 10 MG tablet Take 10 mg by mouth daily.     . cyclobenzaprine (FLEXERIL) 5 MG tablet Take 5 mg by mouth every 8 (eight) hours as needed.    . docusate sodium (COLACE) 100 MG capsule Take 1 capsule (100 mg total) by mouth 2 (two) times daily. 30 capsule 0  . fluticasone (FLONASE) 50 MCG/ACT nasal spray Place 1 spray into the nose daily.     Marland Kitchen gabapentin (NEURONTIN) 300 MG capsule Take 300 mg by mouth 2 (two) times daily.     Marland Kitchen inFLIXimab (REMICADE) 100 MG injection Inject 300 mg into the vein every 6 (six) weeks.     . isosorbide mononitrate (IMDUR) 60 MG 24 hr tablet Take 1 tablet (60 mg total) by mouth daily. 30 tablet 0  . meloxicam (MOBIC) 15 MG tablet Take 15 mg by mouth daily.    . montelukast (SINGULAIR) 10 MG tablet Take 10 mg by mouth at bedtime.     Marland Kitchen omeprazole (PRILOSEC) 20 MG capsule Take 20 mg by mouth daily.     . ondansetron (ZOFRAN) 4 MG tablet Take 1 tablet (4 mg total) by mouth every 6 (six) hours as needed for nausea. 20 tablet 0  . oxyCODONE (ROXICODONE) 5 MG  immediate release tablet Take 0.5 tablets (2.5 mg total) by mouth every 4 (four) hours as needed for severe pain. 3 tablet 0  . predniSONE (DELTASONE) 5 MG tablet Take 5 mg by mouth 2 (two) times daily.    . traMADol (ULTRAM) 50 MG tablet Take 1 tablet (50 mg total) by mouth every 8 (eight) hours as needed (mild pain). 20 tablet 0   No current facility-administered medications for this visit.    PHYSICAL EXAM: Vitals:   10/31/19 1043  BP: 109/70  Pulse: 91  Resp: 18  Temp: 98.1 F (36.7 C)  TempSrc: Temporal  SpO2: 100%  Weight: 140 lb (63.5 kg)  Height: 5\' 3"  (1.6 m)    PROCEDURE: Endovenous laser ablation of the right great saphenous vein  TECHNIQUE: The patient was taken to the exam room and placed supine.  I looked at the great saphenous vein myself with the SonoSite and felt that I could cannulate this in the mid thigh.  The vein was somewhat thickened.  The right leg was prepped and draped in usual sterile fashion.  Under ultrasound guidance, after the skin was anesthetized, I cannulated the great saphenous vein in the mid thigh with a micropuncture needle and a micropuncture sheath was introduced over a  wire.  Next the straight end of the wire was advanced up to the saphenofemoral junction.  The 45 cm sheath was advanced over the wire and the wire and dilator removed.  This was positioned 2 and half centimeters distal to the saphenofemoral junction.  The laser fiber was positioned at the end of the sheath.  The sheath was retracted.  Tumescent anesthesia was then administered circumferentially around the vein.  Next the patient was placed in Trendelenburg.  Using laser precautions laser ablation was performed of the right great saphenous vein from 2 and half centimeters distal to the saphenofemoral junction to the mid thigh.  A total of 26 cm of vein were treated with 1227 J of energy.  Patient tolerated procedure well.  Pressure dressing was applied.  He will return in 2 weeks for a  follow-up duplex.   Deitra Mayo [facility] [place] [facility] [place] [phone]

## 2019-10-31 NOTE — Progress Notes (Signed)
     Laser Ablation Procedure    Date: 10/31/2019   Jonathan [name] DOB:1960/08/11  Consent signed: Yes    Surgeon: Gae Gallop MD   Procedure: Laser Ablation: right Greater Saphenous Vein  BP 109/70 (BP Location: Left Arm, Patient Position: Sitting, Cuff Size: Normal)   Pulse 91   Temp 98.1 F (36.7 C) (Temporal)   Resp 18   Ht 5\' 3"  (1.6 m)   Wt 140 lb (63.5 kg)   SpO2 100%   BMI 24.80 kg/m   Tumescent Anesthesia: 300 cc 0.9% NaCl with 50 cc Lidocaine HCL 1%  and 15 cc 8.4% NaHCO3  Local Anesthesia: 2 cc Lidocaine HCL and NaHCO3 (ratio 2:1)  7 watts continuous mode     Total energy: 1227 Joules     Total time: 175 seconds Treatment Length 26 cm   Laser Fiber Ref. # [id number]      Lot # [id number]     Patient tolerated procedure well  Notes: Patient wore face mask.  All staff members wore facial masks and facial shields/goggles.    Description of Procedure:  After marking the course of the secondary varicosities, the patient was placed on the operating table in the supine position, and the right leg was prepped and draped in sterile fashion.   Local anesthetic was administered and under ultrasound guidance the saphenous vein was accessed with a micro needle and guide wire; then the mirco puncture sheath was placed.  A guide wire was inserted saphenofemoral junction , followed by a 5 french sheath.  The position of the sheath and then the laser fiber below the junction was confirmed using the ultrasound.  Tumescent anesthesia was administered along the course of the saphenous vein using ultrasound guidance. The patient was placed in Trendelenburg position and protective laser glasses were placed on patient and staff, and the laser was fired at 7 watts continuous mode advancing for a total of 1227 joules.      Steri strip was applied to the IV insertion site and ABD pads and thigh high compression stockings were applied.  Ace wrap bandages were applied over the right  thigh and at the top of the saphenofemoral junction. Blood loss was less than 15 cc.  Discharge instructions reviewed with patient and hardcopy of discharge instructions given to patient to take home. The patient ambulated out of the operating room having tolerated the procedure well.

## 2019-11-07 ENCOUNTER — Encounter (HOSPITAL_BASED_OUTPATIENT_CLINIC_OR_DEPARTMENT_OTHER): Payer: Medicare Other | Admitting: Internal Medicine

## 2019-11-07 ENCOUNTER — Other Ambulatory Visit: Payer: Self-pay

## 2019-11-07 DIAGNOSIS — L97208 Non-pressure chronic ulcer of unspecified calf with other specified severity: Secondary | ICD-10-CM

## 2019-11-13 ENCOUNTER — Encounter (HOSPITAL_COMMUNITY): Payer: Medicare Other

## 2019-11-13 ENCOUNTER — Ambulatory Visit: Payer: Medicare Other | Admitting: Vascular Surgery

## 2019-11-14 ENCOUNTER — Encounter (HOSPITAL_BASED_OUTPATIENT_CLINIC_OR_DEPARTMENT_OTHER): Payer: Medicare Other | Admitting: Internal Medicine

## 2019-11-21 DIAGNOSIS — R5381 Other malaise: Secondary | ICD-10-CM | POA: Insufficient documentation

## 2019-11-21 DIAGNOSIS — R55 Syncope and collapse: Secondary | ICD-10-CM | POA: Insufficient documentation

## 2019-11-22 ENCOUNTER — Encounter (HOSPITAL_BASED_OUTPATIENT_CLINIC_OR_DEPARTMENT_OTHER): Payer: Medicare Other | Admitting: Physician Assistant

## 2019-11-28 ENCOUNTER — Encounter: Payer: Self-pay | Admitting: Vascular Surgery

## 2019-11-28 ENCOUNTER — Other Ambulatory Visit: Payer: Self-pay

## 2019-11-28 ENCOUNTER — Ambulatory Visit (INDEPENDENT_AMBULATORY_CARE_PROVIDER_SITE_OTHER): Payer: Medicare Other | Admitting: Vascular Surgery

## 2019-11-28 ENCOUNTER — Ambulatory Visit (HOSPITAL_COMMUNITY)
Admission: RE | Admit: 2019-11-28 | Discharge: 2019-11-28 | Disposition: A | Discharge status: Home / Self Care | Payer: Medicare Other | Source: Ambulatory Visit | Attending: Vascular Surgery | Admitting: Vascular Surgery

## 2019-11-28 VITALS — BP 96/68 | HR 73 | Temp 98.2°F | Resp 18 | Ht 63.0 in | Wt 145.0 lb

## 2019-11-28 DIAGNOSIS — I872 Venous insufficiency (chronic) (peripheral): Secondary | ICD-10-CM

## 2019-11-28 DIAGNOSIS — L97208 Non-pressure chronic ulcer of unspecified calf with other specified severity: Secondary | ICD-10-CM | POA: Diagnosis present

## 2019-11-28 DIAGNOSIS — I83813 Varicose veins of bilateral lower extremities with pain: Secondary | ICD-10-CM | POA: Diagnosis not present

## 2019-11-28 DIAGNOSIS — I83002 Varicose veins of unspecified lower extremity with ulcer of calf: Secondary | ICD-10-CM

## 2019-11-28 NOTE — Progress Notes (Signed)
Patient name: Jonathan [name] MRN: [id number] DOB: 1961/03/14 Sex: male  REASON FOR VISIT: Follow-up after endovenous laser ablation of the right great saphenous vein  HPI: Jonathan [name] is a 59 y.o. male with CEAP C6 venous disease.  His wounds were being treated with Unna boots.  Noninvasive studies showed significant reflux in both great saphenous veins.  In addition he had deep venous reflux.  I recommended staged bilateral great saphenous veins ablations.  He underwent successful laser ablation the left great saphenous vein in June of this year.  He returns for laser ablation on the right which was performed on 10/31/2019.  I treated from 2.5 cm distal to the saphenofemoral junction to the mid thigh.  Patient has no specific complaints today.  Has had no significant leg swelling had no significant ecchymosis.  He is having dressing change on the left leg at the wound care center every 2 weeks.  Current Outpatient Medications  Medication Sig Dispense Refill  . acetaminophen (TYLENOL) 325 MG tablet Take 2 tablets (650 mg total) by mouth every 4 (four) hours as needed for headache or mild pain. 30 tablet 0  . aspirin EC 81 MG tablet Take 1 tablet (81 mg total) by mouth daily. 90 tablet 3  . atorvastatin (LIPITOR) 40 MG tablet Take 40 mg by mouth daily.    . cetirizine (ZYRTEC) 10 MG tablet Take 10 mg by mouth daily.     . cyclobenzaprine (FLEXERIL) 5 MG tablet Take 5 mg by mouth every 8 (eight) hours as needed.    . docusate sodium (COLACE) 100 MG capsule Take 1 capsule (100 mg total) by mouth 2 (two) times daily. 30 capsule 0  . fluticasone (FLONASE) 50 MCG/ACT nasal spray Place 1 spray into the nose daily.     Marland Kitchen gabapentin (NEURONTIN) 300 MG capsule Take 300 mg by mouth 2 (two) times daily.     Marland Kitchen inFLIXimab (REMICADE) 100 MG injection Inject 300 mg into the vein every 6 (six) weeks.     . isosorbide mononitrate (IMDUR) 60 MG 24 hr tablet Take 1 tablet (60 mg total) by mouth daily. 30  tablet 0  . meloxicam (MOBIC) 15 MG tablet Take 15 mg by mouth daily.    . montelukast (SINGULAIR) 10 MG tablet Take 10 mg by mouth at bedtime.     Marland Kitchen omeprazole (PRILOSEC) 20 MG capsule Take 20 mg by mouth daily.     . ondansetron (ZOFRAN) 4 MG tablet Take 1 tablet (4 mg total) by mouth every 6 (six) hours as needed for nausea. 20 tablet 0  . oxyCODONE (ROXICODONE) 5 MG immediate release tablet Take 0.5 tablets (2.5 mg total) by mouth every 4 (four) hours as needed for severe pain. 3 tablet 0  . predniSONE (DELTASONE) 5 MG tablet Take 5 mg by mouth 2 (two) times daily.    . traMADol (ULTRAM) 50 MG tablet Take 1 tablet (50 mg total) by mouth every 8 (eight) hours as needed (mild pain). 20 tablet 0   No current facility-administered medications for this visit.   REVIEW OF SYSTEMS: Valu.Nieves ] denotes positive finding; [  ] denotes negative finding  CARDIOVASCULAR:  [ ]  chest pain   [ ]  dyspnea on exertion  [ ]  leg swelling  CONSTITUTIONAL:  [ ]  fever   [ ]  chills  PHYSICAL EXAM: Vitals:   11/28/19 1446  BP: 96/68  Pulse: 73  Resp: 18  Temp: 98.2 F (36.8 C)  TempSrc: Temporal  SpO2: 100%  Weight: 65.8 kg  Height: 5\' 3"  (1.6 m)   GENERAL: The patient is a well-nourished male, in no acute distress. The vital signs are documented above. CARDIOVASCULAR: There is a regular rate and rhythm. PULMONARY: There is good air exchange bilaterally without wheezing or rales. VASCULAR: Patient has no significant leg swelling or bruising in the right leg.  DATA:  VENOUS DUPLEX: I have independently interpreted his venous duplex scan today.  On the right side the right great saphenous vein is successfully closed to the mid thigh.  The patient is also undergone successful laser ablation on the left and this is still closed.  MEDICAL ISSUES:  S/P BILATERAL GREAT SAPHENOUS VEIN LASER ABLATIONS: The patient is doing well status post staged bilateral saphenous vein laser ablations.  His wounds will continue  to be followed in the wound care center.  We have again discussed the importance of leg elevation.  I will see him back as needed.  Deitra Mayo [facility] [place] [phone]

## 2019-12-03 ENCOUNTER — Ambulatory Visit (INDEPENDENT_AMBULATORY_CARE_PROVIDER_SITE_OTHER): Payer: Medicare Other | Admitting: Podiatry

## 2019-12-03 ENCOUNTER — Other Ambulatory Visit: Payer: Self-pay

## 2019-12-03 ENCOUNTER — Encounter: Payer: Self-pay | Admitting: Podiatry

## 2019-12-03 DIAGNOSIS — M79674 Pain in right toe(s): Secondary | ICD-10-CM

## 2019-12-03 DIAGNOSIS — M79675 Pain in left toe(s): Secondary | ICD-10-CM

## 2019-12-03 DIAGNOSIS — B351 Tinea unguium: Secondary | ICD-10-CM

## 2019-12-03 DIAGNOSIS — I872 Venous insufficiency (chronic) (peripheral): Secondary | ICD-10-CM

## 2019-12-03 DIAGNOSIS — I83013 Varicose veins of right lower extremity with ulcer of ankle: Secondary | ICD-10-CM | POA: Diagnosis not present

## 2019-12-03 DIAGNOSIS — L97319 Non-pressure chronic ulcer of right ankle with unspecified severity: Secondary | ICD-10-CM

## 2019-12-03 NOTE — Progress Notes (Signed)
This patient returns to my office for at risk foot care.  This patient requires this care by a professional since this patient will be at risk due to having venous insufficiency and foot/leg ulcers.  This patient is unable to cut nails himself since the patient cannot reach his nails.These nails are painful walking and wearing shoes.  This patient presents for at risk foot care today. Patient has been treated by  the wound center with unna boots  left leg.  He is wearing unna boot and needs the home nursing service call him about changing the unna boot. He was recently released from the hospital .  General Appearance  Alert, conversant and in no acute stress.  Vascular  Deferred due to unna boots. Left foot. Weakly palpable dorsalis pedis right and absent posterior tibial pulses are absent right foot.  2-3 plus swelling.    Neurologic  Deferred due to unna boots left foot.  LOPS  WNL right foot.  Nails Thick disfigured discolored nails with subungual debris  from hallux to fifth toes bilaterally. No evidence of bacterial infection or drainage bilaterally.  Orthopedic  No limitations of motion  feet .  No crepitus or effusions noted.  No bony pathology or digital deformities noted.  Skin  normotropic skin with no porokeratosis noted bilaterally.  No signs of infections or ulcers noted.  Significant crusts noted on the exposed skin right foot and toes left foot.  Sores on his toes right foot being cared for by wound center.     Onychomycosis  Pain in right toes  Pain in left toes  Consent was obtained for treatment procedures.   Mechanical debridement of nails 1-5  bilaterally performed with a nail nipper.  Filed with dremel without incident.  Patient was told we should not get him diabetic shoes until the unna boots are permanently removed.     Return office visit   3 months  For nail care.                Told patient to return for periodic foot care and evaluation due to potential at risk  complications.   Gardiner Barefoot DPM

## 2019-12-09 ENCOUNTER — Other Ambulatory Visit: Payer: Self-pay

## 2019-12-09 ENCOUNTER — Encounter: Payer: Self-pay | Admitting: Podiatry

## 2019-12-09 ENCOUNTER — Ambulatory Visit (INDEPENDENT_AMBULATORY_CARE_PROVIDER_SITE_OTHER): Payer: Medicare Other | Admitting: Podiatry

## 2019-12-09 VITALS — Temp 98.1°F

## 2019-12-09 DIAGNOSIS — M0550 Rheumatoid polyneuropathy with rheumatoid arthritis of unspecified site: Secondary | ICD-10-CM | POA: Insufficient documentation

## 2019-12-09 DIAGNOSIS — L97319 Non-pressure chronic ulcer of right ankle with unspecified severity: Secondary | ICD-10-CM | POA: Diagnosis not present

## 2019-12-09 DIAGNOSIS — Z9889 Other specified postprocedural states: Secondary | ICD-10-CM

## 2019-12-09 DIAGNOSIS — I83013 Varicose veins of right lower extremity with ulcer of ankle: Secondary | ICD-10-CM

## 2019-12-09 DIAGNOSIS — M05771 Rheumatoid arthritis with rheumatoid factor of right ankle and foot without organ or systems involvement: Secondary | ICD-10-CM | POA: Diagnosis not present

## 2019-12-09 NOTE — Progress Notes (Signed)
   HPI: 59 y.o. male presenting today for follow-up evaluation of bilateral foot deformity.  Patient does have a history of surgical reconstruction of the right foot with a delayed healing and ulcers that developed postsurgically.  Patient has since had the wounds heal and his right foot actually looks very well today.  He is wondering if he needs to have surgery to the left foot.  He presents for further treatment and evaluation.  He is also been having nurses come to the home and wrapping his feet for chronic bilateral lower extremity edema.  He presents today wearing postsurgical shoe bilateral  Past Medical History:  Diagnosis Date  . Anemia    H/o using iron in the past   . Arthritis    rheumatoid  . Chronic back pain   . Hyperlipidemia   . Joint pain   . Joint swelling   . Rheumatoid arthritis(714.0)      Physical Exam: General: The patient is alert and oriented x3 in no acute distress.  Dermatology: Skin is warm, dry and supple bilateral lower extremities. Negative for open lesions or macerations.  There is exception to a very superficial skin ulcer approximately 4 mm in diameter overlying the second MTPJ of the right foot limited to the breakdown of the skin.  Skin is very xerotic and dry.  Vascular: Palpable pedal pulses bilaterally.  Chronic edema noted to the bilateral lower extremities right greater than the left  Neurological: Epicritic and protective threshold grossly intact bilaterally.   Musculoskeletal Exam: Status post right forefoot reconstructive surgery with good healing.  Foot is in a good rectus position.  Clinical evidence of a bunion deformity noted with hammertoes 2-5 left foot.   Assessment: 1.  Status post right forefoot reconstructive surgery 2.  Bunion left foot 3.  Hammertoes 2-5 left foot   Plan of Care:  1. Patient evaluated.  2.  Patient may discontinue postsurgical shoes.  Recommend good supportive sneakers 3.  The patient states that the left  foot is only minimally symptomatic.  I do not recommend surgery given the patient's past medical history and complicated ability for wound healing.  The patient agrees. 4.  Continue management at the [facility] wound care center.  It does appear that all the patient's wounds have healed with the exception of the superficial ulcer to the dorsal aspect of the right second toe.  Recommend compression socks daily 5.  Return to clinic in 3 months for routine foot care     Edrick Kins, DPM [facility]  Dr. Edrick Kins, DPM    [address], [place] [address]                Office [phone]  Fax [phone]

## 2019-12-11 ENCOUNTER — Telehealth: Payer: Self-pay | Admitting: Podiatry

## 2019-12-11 NOTE — Telephone Encounter (Signed)
Colletta Maryland (nurse) from [facility] at [facility] called requesting wound care orders for the patient.   Please call her back with the orders and leave a voicemail if she does not answer.  [phone]

## 2019-12-13 ENCOUNTER — Telehealth: Payer: Self-pay | Admitting: Podiatry

## 2019-12-13 NOTE — Telephone Encounter (Signed)
Levada Dy from [facility] called and would like to know if to continue services on pt and if so which ones and if not do they need to discharge pt

## 2019-12-16 ENCOUNTER — Telehealth: Payer: Self-pay

## 2019-12-16 NOTE — Telephone Encounter (Signed)
Nurse called from [facility] to see if its ok to D/c pt or would you like for them to stay a little longer. Please advise.

## 2019-12-17 NOTE — Telephone Encounter (Signed)
Okay to discharge from my standpoint. - Dr. Amalia Hailey

## 2019-12-18 ENCOUNTER — Encounter (HOSPITAL_COMMUNITY): Payer: Medicare Other

## 2019-12-18 ENCOUNTER — Ambulatory Visit: Payer: Medicare Other | Admitting: Vascular Surgery

## 2019-12-19 ENCOUNTER — Encounter: Payer: Self-pay | Admitting: Vascular Surgery

## 2019-12-26 ENCOUNTER — Encounter (HOSPITAL_BASED_OUTPATIENT_CLINIC_OR_DEPARTMENT_OTHER): Payer: Medicare Other | Admitting: Internal Medicine

## 2019-12-26 DIAGNOSIS — Y92009 Unspecified place in unspecified non-institutional (private) residence as the place of occurrence of the external cause: Secondary | ICD-10-CM | POA: Insufficient documentation

## 2019-12-26 DIAGNOSIS — I420 Dilated cardiomyopathy: Secondary | ICD-10-CM | POA: Insufficient documentation

## 2019-12-26 DIAGNOSIS — I2510 Atherosclerotic heart disease of native coronary artery without angina pectoris: Secondary | ICD-10-CM | POA: Insufficient documentation

## 2019-12-26 DIAGNOSIS — W19XXXA Unspecified fall, initial encounter: Secondary | ICD-10-CM | POA: Insufficient documentation

## 2019-12-26 DIAGNOSIS — I255 Ischemic cardiomyopathy: Secondary | ICD-10-CM | POA: Insufficient documentation

## 2019-12-26 DIAGNOSIS — I2582 Chronic total occlusion of coronary artery: Secondary | ICD-10-CM | POA: Insufficient documentation

## 2020-01-13 ENCOUNTER — Ambulatory Visit: Payer: Medicare Other | Admitting: Podiatry

## 2020-02-12 ENCOUNTER — Ambulatory Visit: Payer: Medicare Other | Admitting: Podiatry

## 2020-03-04 ENCOUNTER — Ambulatory Visit: Payer: Medicare Other | Admitting: Podiatry

## 2020-03-07 DIAGNOSIS — H60312 Diffuse otitis externa, left ear: Secondary | ICD-10-CM | POA: Insufficient documentation

## 2020-03-11 ENCOUNTER — Other Ambulatory Visit: Payer: Self-pay

## 2020-03-11 ENCOUNTER — Ambulatory Visit: Payer: Medicare Other | Admitting: Podiatry

## 2020-03-11 ENCOUNTER — Ambulatory Visit (INDEPENDENT_AMBULATORY_CARE_PROVIDER_SITE_OTHER): Payer: Medicare Other | Admitting: Podiatry

## 2020-03-11 ENCOUNTER — Encounter: Payer: Self-pay | Admitting: Podiatry

## 2020-03-11 DIAGNOSIS — M79675 Pain in left toe(s): Secondary | ICD-10-CM

## 2020-03-11 DIAGNOSIS — M2041 Other hammer toe(s) (acquired), right foot: Secondary | ICD-10-CM | POA: Diagnosis not present

## 2020-03-11 DIAGNOSIS — M2141 Flat foot [pes planus] (acquired), right foot: Secondary | ICD-10-CM

## 2020-03-11 DIAGNOSIS — I872 Venous insufficiency (chronic) (peripheral): Secondary | ICD-10-CM

## 2020-03-11 DIAGNOSIS — L97522 Non-pressure chronic ulcer of other part of left foot with fat layer exposed: Secondary | ICD-10-CM

## 2020-03-11 DIAGNOSIS — B351 Tinea unguium: Secondary | ICD-10-CM | POA: Diagnosis not present

## 2020-03-11 DIAGNOSIS — M2042 Other hammer toe(s) (acquired), left foot: Secondary | ICD-10-CM

## 2020-03-11 DIAGNOSIS — M2142 Flat foot [pes planus] (acquired), left foot: Secondary | ICD-10-CM

## 2020-03-11 DIAGNOSIS — M79674 Pain in right toe(s): Secondary | ICD-10-CM

## 2020-03-11 NOTE — Progress Notes (Signed)
This patient returns to my office for at risk foot care.  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    This patient is unable to cut nails himself since the patient cannot reach his nails.These nails are painful walking and wearing shoes.   Patient has chronic open wound which goes and comes. This patient presents for at risk foot care today.  General Appearance  Alert, conversant and in no acute stress.  Vascular  Dorsalis pedis and posterior tibial  pulses are palpable  bilaterally.  Capillary return is within normal limits  bilaterally. Temperature is within normal limits  Bilaterally.  Chronic edema  Legs  B/L.  Neurologic  Senn-Weinstein monofilament wire test within normal limits  bilaterally. Muscle power within normal limits bilaterally.  Nails Thick disfigured discolored nails with subungual debris  from hallux to fifth toes bilaterally. No evidence of bacterial infection or drainage bilaterally.  Orthopedic  No limitations of motion  feet .  No crepitus or effusions noted.  No bony pathology or digital deformities noted.  HAV with hammer toes.    Skin  normotropic skin with no porokeratosis noted bilaterally.  Open wound dorsum of left foot proximal to third toe left foot.  No redness or swelling noted.  No drainage.   Onychomycosis  Pain in right toes  Pain in left toes  Open wound left forefoot dorsally.  Consent was obtained for treatment procedures.   Mechanical debridement of nails 1-5  bilaterally performed with a nail nipper.  Filed with dremel without incident.  Asked  Dr.  Amalia Hailey to evaluate his ulcer.  He recommended bandage to the open wound and make an appointment with wound clinic.   Return office visit    3 months for nail care.                 Told patient to return for periodic foot care and evaluation due to potential at risk  complications.   Gardiner Barefoot DPM

## 2020-06-17 ENCOUNTER — Ambulatory Visit (INDEPENDENT_AMBULATORY_CARE_PROVIDER_SITE_OTHER): Payer: Medicare Other | Admitting: Podiatry

## 2020-06-17 ENCOUNTER — Encounter: Payer: Self-pay | Admitting: Podiatry

## 2020-06-17 ENCOUNTER — Other Ambulatory Visit: Payer: Self-pay

## 2020-06-17 DIAGNOSIS — M79674 Pain in right toe(s): Secondary | ICD-10-CM

## 2020-06-17 DIAGNOSIS — M2141 Flat foot [pes planus] (acquired), right foot: Secondary | ICD-10-CM | POA: Diagnosis not present

## 2020-06-17 DIAGNOSIS — B351 Tinea unguium: Secondary | ICD-10-CM

## 2020-06-17 DIAGNOSIS — M2042 Other hammer toe(s) (acquired), left foot: Secondary | ICD-10-CM

## 2020-06-17 DIAGNOSIS — M2142 Flat foot [pes planus] (acquired), left foot: Secondary | ICD-10-CM

## 2020-06-17 DIAGNOSIS — M79675 Pain in left toe(s): Secondary | ICD-10-CM

## 2020-06-17 DIAGNOSIS — I872 Venous insufficiency (chronic) (peripheral): Secondary | ICD-10-CM | POA: Diagnosis not present

## 2020-06-17 DIAGNOSIS — M2041 Other hammer toe(s) (acquired), right foot: Secondary | ICD-10-CM | POA: Diagnosis not present

## 2020-06-17 NOTE — Progress Notes (Signed)
This patient returns to my office for at risk foot care.  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    This patient is unable to cut nails himself since the patient cannot reach his nails.These nails are painful walking and wearing shoes.    This patient presents for at risk foot care today.  General Appearance  Alert, conversant and in no acute stress.  Vascular  Dorsalis pedis and posterior tibial  pulses are weakly  palpable  bilaterally.  Capillary return is within normal limits  bilaterally. Temperature is within normal limits  Bilaterally.  Chronic edema  Legs  B/L.  Neurologic  Senn-Weinstein monofilament wire test within normal limits  bilaterally. Muscle power within normal limits bilaterally.  Nails Thick disfigured discolored nails with subungual debris  from hallux to fifth toes bilaterally. No evidence of bacterial infection or drainage bilaterally.  Orthopedic  No limitations of motion  feet .  No crepitus or effusions noted.  No bony pathology or digital deformities noted.  HAV with hammer toes.    Skin  normotropic skin with no porokeratosis noted bilaterally.  Open wound dorsum of left foot proximal to third toe left foot.  No redness or swelling noted.  No drainage.   Onychomycosis  Pain in right toes  Pain in left toes   Consent was obtained for treatment procedures.   Mechanical debridement of nails 1-5  bilaterally performed with a nail nipper.  Filed with dremel without incident.     Return office visit    3 months for nail care.                 Told patient to return for periodic foot care and evaluation due to potential at risk complications.   Gardiner Barefoot DPM

## 2020-07-01 ENCOUNTER — Ambulatory Visit: Payer: Medicare Other | Admitting: Podiatry

## 2020-09-07 ENCOUNTER — Encounter (HOSPITAL_COMMUNITY): Payer: Self-pay | Admitting: *Deleted

## 2020-09-07 ENCOUNTER — Other Ambulatory Visit (HOSPITAL_COMMUNITY): Payer: Medicare Other

## 2020-09-07 ENCOUNTER — Emergency Department (HOSPITAL_COMMUNITY): Payer: Medicare Other

## 2020-09-07 ENCOUNTER — Emergency Department (HOSPITAL_COMMUNITY)
Admission: EM | Admit: 2020-09-07 | Discharge: 2020-09-07 | Disposition: A | Discharge status: Home / Self Care | Payer: Medicare Other | Attending: Emergency Medicine | Admitting: Emergency Medicine

## 2020-09-07 ENCOUNTER — Other Ambulatory Visit: Payer: Self-pay

## 2020-09-07 DIAGNOSIS — Z79899 Other long term (current) drug therapy: Secondary | ICD-10-CM | POA: Insufficient documentation

## 2020-09-07 DIAGNOSIS — I110 Hypertensive heart disease with heart failure: Secondary | ICD-10-CM | POA: Insufficient documentation

## 2020-09-07 DIAGNOSIS — R079 Chest pain, unspecified: Secondary | ICD-10-CM | POA: Diagnosis present

## 2020-09-07 DIAGNOSIS — Z7982 Long term (current) use of aspirin: Secondary | ICD-10-CM | POA: Diagnosis not present

## 2020-09-07 DIAGNOSIS — I2510 Atherosclerotic heart disease of native coronary artery without angina pectoris: Secondary | ICD-10-CM | POA: Diagnosis not present

## 2020-09-07 DIAGNOSIS — Z96653 Presence of artificial knee joint, bilateral: Secondary | ICD-10-CM | POA: Insufficient documentation

## 2020-09-07 DIAGNOSIS — Z96643 Presence of artificial hip joint, bilateral: Secondary | ICD-10-CM | POA: Diagnosis not present

## 2020-09-07 DIAGNOSIS — Z7901 Long term (current) use of anticoagulants: Secondary | ICD-10-CM | POA: Insufficient documentation

## 2020-09-07 DIAGNOSIS — I5021 Acute systolic (congestive) heart failure: Secondary | ICD-10-CM | POA: Diagnosis not present

## 2020-09-07 DIAGNOSIS — R0789 Other chest pain: Secondary | ICD-10-CM | POA: Diagnosis not present

## 2020-09-07 DIAGNOSIS — Z87891 Personal history of nicotine dependence: Secondary | ICD-10-CM | POA: Diagnosis not present

## 2020-09-07 LAB — BASIC METABOLIC PANEL
Anion gap: 6 (ref 5–15)
BUN: 10 mg/dL (ref 6–20)
CO2: 28 mmol/L (ref 22–32)
Calcium: 9.6 mg/dL (ref 8.9–10.3)
Chloride: 104 mmol/L (ref 98–111)
Creatinine, Ser: 0.41 mg/dL — ABNORMAL LOW (ref 0.61–1.24)
GFR, Estimated: >60 mL/min (ref >60)
Glucose, Bld: 125 mg/dL — ABNORMAL HIGH (ref 70–99)
Potassium: 3.3 mmol/L — ABNORMAL LOW (ref 3.5–5.1)
Sodium: 138 mmol/L (ref 135–145)

## 2020-09-07 LAB — TROPONIN I (HIGH SENSITIVITY)
Troponin I (High Sensitivity): 20 ng/L — ABNORMAL HIGH (ref <18)
Troponin I (High Sensitivity): 23 ng/L — ABNORMAL HIGH (ref <18)

## 2020-09-07 LAB — CBC
HCT: 32.2 % — ABNORMAL LOW (ref 39.0–52.0)
Hemoglobin: 9.9 g/dL — ABNORMAL LOW (ref 13.0–17.0)
MCH: 24.3 pg — ABNORMAL LOW (ref 26.0–34.0)
MCHC: 30.7 g/dL (ref 30.0–36.0)
MCV: 79.1 fL — ABNORMAL LOW (ref 80.0–100.0)
Platelets: 342 10*3/uL (ref 150–400)
RBC: 4.07 MIL/uL — ABNORMAL LOW (ref 4.22–5.81)
RDW: 14.9 % (ref 11.5–15.5)
WBC: 6.6 10*3/uL (ref 4.0–10.5)
nRBC: 0 % (ref 0.0–0.2)

## 2020-09-07 LAB — D-DIMER, QUANTITATIVE: D-Dimer, Quant: 15.28 ug/mL-FEU — ABNORMAL HIGH (ref 0.00–0.50)

## 2020-09-07 MED ORDER — TRAMADOL HCL 50 MG PO TABS: 50.0000 mg | ORAL_TABLET | Freq: Four times a day (QID) | ORAL | 0 refills | Status: DC | PRN

## 2020-09-07 MED ORDER — TECHNETIUM TO 99M ALBUMIN AGGREGATED
4.2000 | Freq: Once | INTRAVENOUS | Status: AC | PRN
  Administered 2020-09-07: 4.2 via INTRAVENOUS

## 2020-09-07 MED ORDER — OXYCODONE-ACETAMINOPHEN 5-325 MG PO TABS
1.0000 | ORAL_TABLET | Freq: Once | ORAL | Status: AC
  Administered 2020-09-07: 1 via ORAL
  Filled 2020-09-07: qty 1

## 2020-09-07 MED ORDER — HYDROMORPHONE HCL 1 MG/ML IJ SOLN
0.5000 mg | Freq: Once | INTRAMUSCULAR | Status: AC
  Administered 2020-09-07: 0.5 mg via INTRAVENOUS
  Filled 2020-09-07: qty 1

## 2020-09-07 NOTE — ED Provider Notes (Signed)
[facility] DEPT Provider Note   CSN: [id number] Arrival date & time: 09/07/20  [id number]     History Chief Complaint  Patient presents with  . Chest Pain    Rib pain    Jonathan [name] is a 60 y.o. male.  Patient complains of left-sided chest pain.  Pain is worse with movement mild discomfort with breathing  The history is provided by the patient and medical records. No language interpreter was used.  Chest Pain Pain location:  L chest Pain quality: aching   Pain radiates to:  Does not radiate Pain severity:  Moderate Onset quality:  Sudden Timing:  Constant Progression:  Waxing and waning Chronicity:  New Relieved by:  Nothing Worsened by:  Nothing Associated symptoms: no abdominal pain, no back pain, no cough, no fatigue and no headache        Past Medical History:  Diagnosis Date  . Anemia    H/o using iron in the past   . Arthritis    rheumatoid  . Chronic back pain   . Hyperlipidemia   . Joint pain   . Joint swelling   . Rheumatoid arthritis(714.0)     Patient Active Problem List   Diagnosis Date Noted  . Acute diffuse otitis externa of left ear 03/07/2020  . Chronic total occlusion of coronary artery 12/26/2019  . Coronary artery disease involving native coronary artery of native heart 12/26/2019  . Fall at home 12/26/2019  . 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[place]) 12/26/2019  . 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 Physical debility 11/21/2019  . Syncope 11/21/2019  . DDD (degenerative disc disease), lumbar 09/27/2019  . Open wound of left foot 09/27/2019  . Open wound of right foot 09/27/2019  . Pain due to onychomycosis of toenails of both feet 08/28/2019  . Acute systolic CHF (congestive heart failure) ([place]) 08/22/2019  . Chronic pain syndrome 08/22/2019  . Rheumatoid arthritis ([place]) 08/22/2019  . Venous stasis ulcers ([place]) 08/22/2019  . Anemia, unspecified 08/22/2019  . NSTEMI (non-ST elevated  myocardial infarction) ([place]) 08/21/2019  . Cervical spondylosis without myelopathy 08/21/2019  . Lumbar facet arthropathy 08/14/2019  . 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 B12 deficiency 02/08/2019  . Glucose intolerance (impaired glucose tolerance) 02/08/2019  . 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 Hyperglycemia 12/27/2018  . Cellulitis 12/27/2018  . Wound drainage 12/22/2018  . Foot ulcer ([place]) 12/21/2018  . Encounter for screening for HIV 12/21/2018  . Arthritis 08/22/2018  . HLD (hyperlipidemia) 08/22/2018  . Lung nodule 08/22/2018  . Renal cyst, right 08/22/2018  . Rheumatoid arthritis involving multiple sites with positive rheumatoid factor ([place]) 08/22/2018  . Venous insufficiency 08/22/2018  . Vitamin D deficiency 08/22/2018  . Seasonal allergic rhinitis 07/16/2018  . Essential hypertension 02/23/2018  . Insomnia 02/23/2018  . Tinea versicolor 02/23/2018  . Antalgic gait 09/21/2017  . Chronic pain of both knees 09/21/2017  . Foot pain, bilateral 09/21/2017  . Elevated liver enzymes 07/10/2017  . Leg edema 06/08/2017  . Primary osteoarthritis, left ankle and foot 05/09/2017  . Post-traumatic osteoarthritis, right ankle and foot 05/09/2017  . Primary osteoarthritis, right ankle and foot 05/09/2017  . Erectile dysfunction 02/15/2017  . Excessive sweating 02/15/2017  . Difficulty transferring 01/27/2017  . Acquired bilateral flat feet 11/15/2016  . Other acquired hammer toe 11/15/2016  . Erosive gastritis 09/05/2016  . At high risk for falls 08/15/2016  . Chronic bilateral low back pain without sciatica 10/13/2015  . Drug therapy 08/03/2015  . Rheumatoid arthritis  involving left hip ([place]) 01/29/2015  . 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 Status post total replacement of right hip 11/11/2014  . Gait difficulty 08/21/2013  . Benign neoplasm of colon 04/03/2013  . Reflux esophagitis  04/03/2013  . Microcytic anemia 04/01/2013  . Chronic ulcer of right leg ([place]) 01/13/2013  . Pressure ulcer 01/13/2013  . 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 Degenerative arthritis of knee 06/07/2011    Past Surgical History:  Procedure Laterality Date  . COLONOSCOPY N/A 04/03/2013   Procedure: COLONOSCOPY;  Surgeon: Irene Shipper, MD;  Location: [facility];  Service: Endoscopy;  Laterality: N/A;  . COLONOSCOPY    . ENDOVENOUS ABLATION SAPHENOUS VEIN W/ LASER Left 09/26/2019   endovenous laser ablation left greater saphenous vein by Gae Gallop MD   . ENDOVENOUS ABLATION SAPHENOUS VEIN W/ LASER Right 10/31/2019   endovenous laser ablation right greater saphenous vein by Gae Gallop MD   . ESOPHAGOGASTRODUODENOSCOPY N/A 04/03/2013   Procedure: ESOPHAGOGASTRODUODENOSCOPY (EGD);  Surgeon: Irene Shipper, MD;  Location: [facility];  Service: Endoscopy;  Laterality: N/A;  changed to moderate dr. Henrene Pastor did not want to go to the o.r. for an endo colon-jmt  . EYE SURGERY Left    "when i was a very small boy"  . JOINT REPLACEMENT     both knees  . KNEE ARTHROPLASTY  06/07/2011   Procedure: COMPUTER ASSISTED TOTAL KNEE ARTHROPLASTY;  Surgeon: Mcarthur Rossetti, MD;  Location: [place];  Service: Orthopedics;  Laterality: Right;  Right total knee arthoplasty  . KNEE CLOSED REDUCTION  07/19/2011   Procedure: CLOSED MANIPULATION KNEE;  Surgeon: Mcarthur Rossetti, MD;  Location: [place];  Service: Orthopedics;  Laterality: Right;  Manipulation under anesthesia right knee  . LEFT HEART CATH AND CORONARY ANGIOGRAPHY N/A 08/21/2019   Procedure: LEFT HEART CATH AND CORONARY ANGIOGRAPHY;  Surgeon: Belva Crome, MD;  Location: [place] [facility];  Service: Cardiovascular;  Laterality: N/A;  . TOTAL HIP ARTHROPLASTY Right 11/11/2014   Procedure: RIGHT TOTAL HIP ARTHROPLASTY ANTERIOR APPROACH;  Surgeon: Mcarthur Rossetti, MD;  Location: [place];  Service: Orthopedics;  Laterality: Right;  .  TOTAL HIP ARTHROPLASTY Left 01/29/2015   Procedure: LEFT TOTAL HIP ARTHROPLASTY ANTERIOR APPROACH;  Surgeon: Mcarthur Rossetti, MD;  Location: [place];  Service: Orthopedics;  Laterality: Left;       Family History  Problem Relation Age of Onset  . Cancer Mother        ?  Marland Kitchen Hypertension Mother   . Heart failure Father        heart transplant  . Cancer Brother   . Anesthesia problems Neg Hx   . Colon cancer Neg Hx        mother had "3" types of CA- unsure about which one  . Esophageal cancer Neg Hx   . Rectal cancer Neg Hx   . Stomach cancer Neg Hx     Social History   Tobacco Use  . Smoking status: Former Smoker    Types: Cigarettes    Quit date: 04/25/2004    Years since quitting: 16.3  . Smokeless tobacco: Never Used  Vaping Use  . Vaping Use: Never used  Substance Use Topics  . Alcohol use: No  . Drug use: No    Home Medications Prior to Admission medications   Medication Sig Start Date End Date Taking? Authorizing Provider  acetaminophen (  TYLENOL) 325 MG tablet Take 2 tablets (650 mg total) by mouth every 4 (four) hours as needed for headache or mild pain. 08/24/19   Allie Bossier, MD  aspirin EC 81 MG tablet Take 1 tablet (81 mg total) by mouth daily. 08/27/19   Minus Breeding, MD  atorvastatin (LIPITOR) 40 MG tablet Take 40 mg by mouth daily.    [provider]  cetirizine (ZYRTEC) 10 MG tablet Take 10 mg by mouth daily.  07/16/18   [provider]  cyanocobalamin (,VITAMIN B-12,) 1000 MCG/ML injection Inject into the muscle. 03/26/20 09/22/20  [provider]  cyclobenzaprine (FLEXERIL) 5 MG tablet Take 5 mg by mouth every 8 (eight) hours as needed. 07/10/19   [provider]  docusate sodium (COLACE) 100 MG capsule Take 1 capsule (100 mg total) by mouth 2 (two) times daily. 08/24/19   Allie Bossier, MD  fluticasone (FLONASE) 50 MCG/ACT nasal spray Place 1 spray into the nose daily.  07/16/18   [provider]  gabapentin  (NEURONTIN) 300 MG capsule Take 300 mg by mouth 2 (two) times daily.  03/24/16   [provider]  inFLIXimab (REMICADE) 100 MG injection Inject 300 mg into the vein every 6 (six) weeks.  04/14/16   [provider]  isosorbide mononitrate (IMDUR) 60 MG 24 hr tablet Take 1 tablet (60 mg total) by mouth daily. 08/25/19   Allie Bossier, MD  leflunomide (ARAVA) 10 MG tablet Take 1 tablet by mouth daily. 05/20/20   [provider]  lisinopril (ZESTRIL) 5 MG tablet Take 1 tablet by mouth daily. 02/13/20 02/12/21  [provider]  meloxicam (MOBIC) 7.5 MG tablet Take 7.5 mg by mouth daily. 10/01/19   [provider]  metoprolol tartrate (LOPRESSOR) 25 MG tablet Take 12.5 mg by mouth 2 (two) times daily. 05/05/20   [provider]  montelukast (SINGULAIR) 10 MG tablet Take 10 mg by mouth at bedtime.  01/12/19   [provider]  neomycin-polymyxin-hydrocortisone (CORTISPORIN) 3.5-10000-1 OTIC suspension 4 drops 2 (two) times daily. 03/06/20   [provider]  omeprazole (PRILOSEC) 20 MG capsule Take 20 mg by mouth daily.     [provider]  ondansetron (ZOFRAN) 4 MG tablet Take 1 tablet (4 mg total) by mouth every 6 (six) hours as needed for nausea. 08/24/19   Allie Bossier, MD  oxyCODONE (ROXICODONE) 5 MG immediate release tablet Take 0.5 tablets (2.5 mg total) by mouth every 4 (four) hours as needed for severe pain. 09/21/19   Deno Etienne, DO  oxycodone-acetaminophen (PERCOCET) 2.5-325 MG tablet Take by mouth.    [provider]  predniSONE (DELTASONE) 5 MG tablet Take 5 mg by mouth 2 (two) times daily. 07/12/19   [provider]  traMADol (ULTRAM) 50 MG tablet Take 1 tablet (50 mg total) by mouth every 8 (eight) hours as needed (mild pain). 08/24/19   Allie Bossier, MD  triamcinolone (KENALOG) 0.1 % Apply daily to flaky skin as needed 05/07/20   [provider]  rivaroxaban (XARELTO) 10 MG TABS tablet Take 10  mg by mouth daily with breakfast. 06/10/11 07/19/11  Mcarthur Rossetti, MD    Allergies    Infliximab  Review of Systems   Review of Systems  Constitutional: Negative for appetite change and fatigue.  HENT: Negative for congestion, ear discharge and sinus pressure.   Eyes: Negative for discharge.  Respiratory: Negative for cough.   Cardiovascular: Positive for chest pain.  Gastrointestinal:  Negative for abdominal pain and diarrhea.  Genitourinary: Negative for frequency and hematuria.  Musculoskeletal: Negative for back pain.  Skin: Negative for rash.  Neurological: Negative for seizures and headaches.  Psychiatric/Behavioral: Negative for hallucinations.    Physical Exam Updated Vital Signs BP 114/83   Pulse 92   Temp 98.2 F (36.8 C) (Oral)   Resp (!) 23   Ht 5\' 3"  (1.6 m)   Wt 63.5 kg   SpO2 98%   BMI 24.80 kg/m   Physical Exam Vitals and nursing note reviewed.  Constitutional:      Appearance: He is well-developed.  HENT:     Head: Normocephalic.     Nose: Nose normal.  Eyes:     General: No scleral icterus.    Conjunctiva/sclera: Conjunctivae normal.  Neck:     Thyroid: No thyromegaly.  Cardiovascular:     Rate and Rhythm: Normal rate and regular rhythm.     Heart sounds: No murmur heard. No friction rub. No gallop.   Pulmonary:     Breath sounds: No stridor. No wheezing or rales.     Comments: Tender left chest Chest:     Chest wall: Tenderness present.  Abdominal:     General: There is no distension.     Tenderness: There is no abdominal tenderness. There is no rebound.  Musculoskeletal:        General: Normal range of motion.     Cervical back: Neck supple.  Lymphadenopathy:     Cervical: No cervical adenopathy.  Skin:    Findings: No erythema or rash.  Neurological:     Mental Status: He is alert and oriented to person, place, and time.     Motor: No abnormal muscle tone.     Coordination: Coordination normal.  Psychiatric:         Behavior: Behavior normal.     ED Results / Procedures / Treatments   Labs (all labs ordered are listed, but only abnormal results are displayed) Labs Reviewed  CBC - Abnormal; Notable for the following components:      Result Value   RBC 4.07 (*)    Hemoglobin 9.9 (*)    HCT 32.2 (*)    MCV 79.1 (*)    MCH 24.3 (*)    All other components within normal limits  BASIC METABOLIC PANEL - Abnormal; Notable for the following components:   Potassium 3.3 (*)    Glucose, Bld 125 (*)    Creatinine, Ser 0.41 (*)    All other components within normal limits  D-DIMER, QUANTITATIVE - Abnormal; Notable for the following components:   D-Dimer, Quant 15.28 (*)    All other components within normal limits  TROPONIN I (HIGH SENSITIVITY) - Abnormal; Notable for the following components:   Troponin I (High Sensitivity) 20 (*)    All other components within normal limits  TROPONIN I (HIGH SENSITIVITY) - Abnormal; Notable for the following components:   Troponin I (High Sensitivity) 23 (*)    All other components within normal limits    EKG EKG Interpretation  Date/Time:  Monday Sep 07 2020 06:50:10 EDT Ventricular Rate:  101 PR Interval:  178 QRS Duration: 96 QT Interval:  360 QTC Calculation: 467 R Axis:   40 Text Interpretation: Sinus tachycardia Abnormal R-wave progression, early transition Probable left ventricular hypertrophy Inferior infarct, old Confirmed by Milton Ferguson [phone]) on 09/07/2020 11:16:07 AM   Radiology DG Chest 2 View  Result Date: 09/07/2020 CLINICAL DATA:  Chest pain EXAM:  CHEST - 2 VIEW COMPARISON:  09/21/2019 FINDINGS: Cardiomediastinal contours are within normal limits. Atherosclerotic calcification of the aortic knob. No focal airspace consolidation, pleural effusion, or pneumothorax. Chronic deformities of the bilateral proximal humeri. IMPRESSION: No active cardiopulmonary disease. Electronically Signed   By: Davina Poke D.O.   On: 09/07/2020 08:00   NM  PULMONARY VENT AND PERF (V/Q Scan)  Result Date: 09/07/2020 CLINICAL DATA:  PE suspected, chest pain EXAM: NUCLEAR MEDICINE PERFUSION LUNG SCAN TECHNIQUE: Perfusion images were obtained in multiple projections after intravenous injection of radiopharmaceutical. Ventilation scans intentionally deferred if perfusion scan and chest x-ray adequate for interpretation during COVID 19 epidemic. RADIOPHARMACEUTICALS:  4.2 mCi Tc-61m MAA IV COMPARISON:  Same day chest radiograph FINDINGS: No suspicious pulmonary perfusion defects. IMPRESSION: Very low probability for pulmonary embolism by modified perfusion only PIOPED criteria (PE absent). Electronically Signed   By: Eddie Candle M.D.   On: 09/07/2020 14:58    Procedures Procedures   Medications Ordered in ED Medications  oxyCODONE-acetaminophen (PERCOCET/ROXICET) 5-325 MG per tablet 1 tablet (1 tablet Oral Given 09/07/20 0738)  HYDROmorphone (DILAUDID) injection 0.5 mg (0.5 mg Intravenous Given 09/07/20 1141)  technetium albumin aggregated (MAA) injection solution 4.2 millicurie (4.2 millicuries Intravenous Contrast Given 09/07/20 1354)    ED Course  I have reviewed the triage vital signs and the nursing notes.  Pertinent labs & imaging results that were available during my care of the patient were reviewed by me and considered in my medical decision making (see chart for details).    MDM Rules/Calculators/A&P                          Labs x-ray VQ scan all negative.  Suspect chest wall pain.  Patient will be given some pain meds and will follow up with his PCP Final Clinical Impression(s) / ED Diagnoses Final diagnoses:  Atypical chest pain    Rx / DC Orders ED Discharge Orders    None       Milton Ferguson, MD 09/09/20 1046

## 2020-09-07 NOTE — ED Triage Notes (Signed)
CP since before going to bed last night.  Pain increases with movement and palpation

## 2020-09-07 NOTE — ED Notes (Signed)
Pt in Nuclear Medicine at this time for V/Q scan.

## 2020-09-07 NOTE — Discharge Instructions (Addendum)
Follow-up with your doctor later this week if not improving °

## 2020-09-16 ENCOUNTER — Ambulatory Visit: Payer: Medicare Other | Admitting: Podiatry

## 2020-09-22 DIAGNOSIS — I83029 Varicose veins of left lower extremity with ulcer of unspecified site: Secondary | ICD-10-CM | POA: Insufficient documentation

## 2020-09-22 DIAGNOSIS — L97929 Non-pressure chronic ulcer of unspecified part of left lower leg with unspecified severity: Secondary | ICD-10-CM | POA: Insufficient documentation

## 2020-09-26 ENCOUNTER — Other Ambulatory Visit: Payer: Self-pay

## 2020-09-26 ENCOUNTER — Encounter (HOSPITAL_COMMUNITY): Payer: Self-pay

## 2020-09-26 ENCOUNTER — Inpatient Hospital Stay (HOSPITAL_COMMUNITY): Payer: Medicare Other

## 2020-09-26 ENCOUNTER — Emergency Department (HOSPITAL_COMMUNITY): Payer: Medicare Other

## 2020-09-26 ENCOUNTER — Inpatient Hospital Stay (HOSPITAL_COMMUNITY)
Admission: EM | Admit: 2020-09-26 | Discharge: 2020-09-29 | DRG: 286 | Disposition: A | Discharge status: Home / Self Care | Payer: Medicare Other | Attending: Internal Medicine | Admitting: Internal Medicine

## 2020-09-26 DIAGNOSIS — J810 Acute pulmonary edema: Secondary | ICD-10-CM

## 2020-09-26 DIAGNOSIS — E785 Hyperlipidemia, unspecified: Secondary | ICD-10-CM | POA: Diagnosis present

## 2020-09-26 DIAGNOSIS — I509 Heart failure, unspecified: Secondary | ICD-10-CM

## 2020-09-26 DIAGNOSIS — Z8679 Personal history of other diseases of the circulatory system: Secondary | ICD-10-CM

## 2020-09-26 DIAGNOSIS — I2510 Atherosclerotic heart disease of native coronary artery without angina pectoris: Secondary | ICD-10-CM | POA: Diagnosis present

## 2020-09-26 DIAGNOSIS — G894 Chronic pain syndrome: Secondary | ICD-10-CM | POA: Diagnosis present

## 2020-09-26 DIAGNOSIS — I255 Ischemic cardiomyopathy: Secondary | ICD-10-CM | POA: Diagnosis present

## 2020-09-26 DIAGNOSIS — Z20822 Contact with and (suspected) exposure to covid-19: Secondary | ICD-10-CM | POA: Diagnosis present

## 2020-09-26 DIAGNOSIS — I252 Old myocardial infarction: Secondary | ICD-10-CM

## 2020-09-26 DIAGNOSIS — L97909 Non-pressure chronic ulcer of unspecified part of unspecified lower leg with unspecified severity: Secondary | ICD-10-CM

## 2020-09-26 DIAGNOSIS — G629 Polyneuropathy, unspecified: Secondary | ICD-10-CM | POA: Diagnosis present

## 2020-09-26 DIAGNOSIS — I5043 Acute on chronic combined systolic (congestive) and diastolic (congestive) heart failure: Secondary | ICD-10-CM | POA: Diagnosis present

## 2020-09-26 DIAGNOSIS — L089 Local infection of the skin and subcutaneous tissue, unspecified: Secondary | ICD-10-CM

## 2020-09-26 DIAGNOSIS — M05711 Rheumatoid arthritis with rheumatoid factor of right shoulder without organ or systems involvement: Secondary | ICD-10-CM

## 2020-09-26 DIAGNOSIS — L97208 Non-pressure chronic ulcer of unspecified calf with other specified severity: Secondary | ICD-10-CM

## 2020-09-26 DIAGNOSIS — I2582 Chronic total occlusion of coronary artery: Secondary | ICD-10-CM | POA: Diagnosis present

## 2020-09-26 DIAGNOSIS — I214 Non-ST elevation (NSTEMI) myocardial infarction: Secondary | ICD-10-CM

## 2020-09-26 DIAGNOSIS — I420 Dilated cardiomyopathy: Secondary | ICD-10-CM | POA: Diagnosis present

## 2020-09-26 DIAGNOSIS — I5023 Acute on chronic systolic (congestive) heart failure: Secondary | ICD-10-CM

## 2020-09-26 DIAGNOSIS — I248 Other forms of acute ischemic heart disease: Secondary | ICD-10-CM

## 2020-09-26 DIAGNOSIS — J9601 Acute respiratory failure with hypoxia: Secondary | ICD-10-CM | POA: Diagnosis present

## 2020-09-26 DIAGNOSIS — Z79899 Other long term (current) drug therapy: Secondary | ICD-10-CM

## 2020-09-26 DIAGNOSIS — I83009 Varicose veins of unspecified lower extremity with ulcer of unspecified site: Secondary | ICD-10-CM | POA: Diagnosis present

## 2020-09-26 DIAGNOSIS — M549 Dorsalgia, unspecified: Secondary | ICD-10-CM | POA: Diagnosis present

## 2020-09-26 DIAGNOSIS — I872 Venous insufficiency (chronic) (peripheral): Secondary | ICD-10-CM | POA: Diagnosis present

## 2020-09-26 DIAGNOSIS — Z7952 Long term (current) use of systemic steroids: Secondary | ICD-10-CM

## 2020-09-26 DIAGNOSIS — Z8249 Family history of ischemic heart disease and other diseases of the circulatory system: Secondary | ICD-10-CM

## 2020-09-26 DIAGNOSIS — R079 Chest pain, unspecified: Secondary | ICD-10-CM

## 2020-09-26 DIAGNOSIS — Z7982 Long term (current) use of aspirin: Secondary | ICD-10-CM | POA: Diagnosis not present

## 2020-09-26 DIAGNOSIS — M0550 Rheumatoid polyneuropathy with rheumatoid arthritis of unspecified site: Secondary | ICD-10-CM | POA: Diagnosis not present

## 2020-09-26 DIAGNOSIS — T148XXA Other injury of unspecified body region, initial encounter: Secondary | ICD-10-CM

## 2020-09-26 DIAGNOSIS — I110 Hypertensive heart disease with heart failure: Secondary | ICD-10-CM | POA: Diagnosis present

## 2020-09-26 DIAGNOSIS — I959 Hypotension, unspecified: Secondary | ICD-10-CM | POA: Diagnosis not present

## 2020-09-26 DIAGNOSIS — I472 Ventricular tachycardia: Secondary | ICD-10-CM | POA: Diagnosis present

## 2020-09-26 DIAGNOSIS — Z791 Long term (current) use of non-steroidal anti-inflammatories (NSAID): Secondary | ICD-10-CM

## 2020-09-26 DIAGNOSIS — I83002 Varicose veins of unspecified lower extremity with ulcer of calf: Secondary | ICD-10-CM

## 2020-09-26 DIAGNOSIS — I10 Essential (primary) hypertension: Secondary | ICD-10-CM | POA: Diagnosis present

## 2020-09-26 DIAGNOSIS — M05712 Rheumatoid arthritis with rheumatoid factor of left shoulder without organ or systems involvement: Secondary | ICD-10-CM

## 2020-09-26 DIAGNOSIS — Z87891 Personal history of nicotine dependence: Secondary | ICD-10-CM

## 2020-09-26 DIAGNOSIS — S91302D Unspecified open wound, left foot, subsequent encounter: Secondary | ICD-10-CM

## 2020-09-26 DIAGNOSIS — Z888 Allergy status to other drugs, medicaments and biological substances status: Secondary | ICD-10-CM

## 2020-09-26 DIAGNOSIS — R591 Generalized enlarged lymph nodes: Secondary | ICD-10-CM

## 2020-09-26 DIAGNOSIS — E876 Hypokalemia: Secondary | ICD-10-CM | POA: Diagnosis not present

## 2020-09-26 DIAGNOSIS — R0902 Hypoxemia: Secondary | ICD-10-CM

## 2020-09-26 DIAGNOSIS — M069 Rheumatoid arthritis, unspecified: Secondary | ICD-10-CM | POA: Diagnosis present

## 2020-09-26 DIAGNOSIS — R778 Other specified abnormalities of plasma proteins: Secondary | ICD-10-CM | POA: Diagnosis not present

## 2020-09-26 DIAGNOSIS — M7989 Other specified soft tissue disorders: Secondary | ICD-10-CM

## 2020-09-26 DIAGNOSIS — R7989 Other specified abnormal findings of blood chemistry: Secondary | ICD-10-CM

## 2020-09-26 LAB — CBC WITH DIFFERENTIAL/PLATELET
Abs Immature Granulocytes: 0.05 10*3/uL (ref 0.00–0.07)
Basophils Absolute: 0 10*3/uL (ref 0.0–0.1)
Basophils Relative: 0 %
Eosinophils Absolute: 0.5 10*3/uL (ref 0.0–0.5)
Eosinophils Relative: 6 %
HCT: 31.5 % — ABNORMAL LOW (ref 39.0–52.0)
Hemoglobin: 9.8 g/dL — ABNORMAL LOW (ref 13.0–17.0)
Immature Granulocytes: 1 %
Lymphocytes Relative: 5 %
Lymphs Abs: 0.5 10*3/uL — ABNORMAL LOW (ref 0.7–4.0)
MCH: 24.4 pg — ABNORMAL LOW (ref 26.0–34.0)
MCHC: 31.1 g/dL (ref 30.0–36.0)
MCV: 78.4 fL — ABNORMAL LOW (ref 80.0–100.0)
Monocytes Absolute: 0.5 10*3/uL (ref 0.1–1.0)
Monocytes Relative: 6 %
Neutro Abs: 6.8 10*3/uL (ref 1.7–7.7)
Neutrophils Relative %: 82 %
Platelets: 321 10*3/uL (ref 150–400)
RBC: 4.02 MIL/uL — ABNORMAL LOW (ref 4.22–5.81)
RDW: 15.4 % (ref 11.5–15.5)
WBC: 8.3 10*3/uL (ref 4.0–10.5)
nRBC: 0 % (ref 0.0–0.2)

## 2020-09-26 LAB — I-STAT CHEM 8, ED
BUN: 7 mg/dL (ref 6–20)
Calcium, Ion: 1.16 mmol/L (ref 1.15–1.40)
Chloride: 104 mmol/L (ref 98–111)
Creatinine, Ser: 0.5 mg/dL — ABNORMAL LOW (ref 0.61–1.24)
Glucose, Bld: 131 mg/dL — ABNORMAL HIGH (ref 70–99)
HCT: 29 % — ABNORMAL LOW (ref 39.0–52.0)
Hemoglobin: 9.9 g/dL — ABNORMAL LOW (ref 13.0–17.0)
Potassium: 3.2 mmol/L — ABNORMAL LOW (ref 3.5–5.1)
Sodium: 139 mmol/L (ref 135–145)
TCO2: 27 mmol/L (ref 22–32)

## 2020-09-26 LAB — COMPREHENSIVE METABOLIC PANEL
ALT: 12 U/L (ref 0–44)
AST: 34 U/L (ref 15–41)
Albumin: 2.9 g/dL — ABNORMAL LOW (ref 3.5–5.0)
Alkaline Phosphatase: 75 U/L (ref 38–126)
Anion gap: 9 (ref 5–15)
BUN: 8 mg/dL (ref 6–20)
CO2: 23 mmol/L (ref 22–32)
Calcium: 8.8 mg/dL — ABNORMAL LOW (ref 8.9–10.3)
Chloride: 105 mmol/L (ref 98–111)
Creatinine, Ser: 0.68 mg/dL (ref 0.61–1.24)
GFR, Estimated: >60 mL/min (ref >60)
Glucose, Bld: 124 mg/dL — ABNORMAL HIGH (ref 70–99)
Potassium: 4.1 mmol/L (ref 3.5–5.1)
Sodium: 137 mmol/L (ref 135–145)
Total Bilirubin: 1.3 mg/dL — ABNORMAL HIGH (ref 0.3–1.2)
Total Protein: 6.9 g/dL (ref 6.5–8.1)

## 2020-09-26 LAB — TROPONIN I (HIGH SENSITIVITY)
Troponin I (High Sensitivity): 107 ng/L (ref <18)
Troponin I (High Sensitivity): 144 ng/L (ref <18)
Troponin I (High Sensitivity): 84 ng/L — ABNORMAL HIGH (ref <18)

## 2020-09-26 LAB — ECHOCARDIOGRAM COMPLETE
Area-P 1/2: 6.12 cm2
Calc EF: 32.9 %
Height: 63 in
S' Lateral: 4.8 cm
Single Plane A2C EF: 25.4 %
Single Plane A4C EF: 41.2 %
Weight: 2240.01 oz

## 2020-09-26 LAB — MAGNESIUM: Magnesium: 1.7 mg/dL (ref 1.7–2.4)

## 2020-09-26 LAB — RESP PANEL BY RT-PCR (FLU A&B, COVID) ARPGX2
Influenza A by PCR: NEGATIVE
Influenza B by PCR: NEGATIVE
SARS Coronavirus 2 by RT PCR: NEGATIVE

## 2020-09-26 LAB — HEPARIN LEVEL (UNFRACTIONATED)
Heparin Unfractionated: <0.1 IU/mL — ABNORMAL LOW (ref 0.30–0.70)
Heparin Unfractionated: 0.1 IU/mL — ABNORMAL LOW (ref 0.30–0.70)

## 2020-09-26 LAB — HIV ANTIBODY (ROUTINE TESTING W REFLEX): HIV Screen 4th Generation wRfx: NONREACTIVE

## 2020-09-26 LAB — BRAIN NATRIURETIC PEPTIDE: B Natriuretic Peptide: 488.9 pg/mL — ABNORMAL HIGH (ref 0.0–100.0)

## 2020-09-26 MED ORDER — ASPIRIN EC 81 MG PO TBEC
81.0000 mg | DELAYED_RELEASE_TABLET | Freq: Every day | ORAL | Status: DC
  Administered 2020-09-27 – 2020-09-29 (×3): 81 mg via ORAL
  Filled 2020-09-26 (×3): qty 1

## 2020-09-26 MED ORDER — VANCOMYCIN HCL IN DEXTROSE 1-5 GM/200ML-% IV SOLN
1000.0000 mg | Freq: Once | INTRAVENOUS | Status: AC
  Administered 2020-09-26: 1000 mg via INTRAVENOUS
  Filled 2020-09-26: qty 200

## 2020-09-26 MED ORDER — HEPARIN BOLUS VIA INFUSION
2000.0000 [IU] | Freq: Once | INTRAVENOUS | Status: AC
  Administered 2020-09-26: 2000 [IU] via INTRAVENOUS
  Filled 2020-09-26: qty 2000

## 2020-09-26 MED ORDER — FUROSEMIDE 10 MG/ML IJ SOLN
40.0000 mg | Freq: Once | INTRAMUSCULAR | Status: AC
  Administered 2020-09-26: 40 mg via INTRAVENOUS
  Filled 2020-09-26: qty 4

## 2020-09-26 MED ORDER — ACETAMINOPHEN 325 MG PO TABS
650.0000 mg | ORAL_TABLET | Freq: Four times a day (QID) | ORAL | Status: DC | PRN
  Administered 2020-09-28 – 2020-09-29 (×3): 650 mg via ORAL
  Filled 2020-09-26 (×3): qty 2

## 2020-09-26 MED ORDER — ASPIRIN 81 MG PO CHEW
324.0000 mg | CHEWABLE_TABLET | Freq: Once | ORAL | Status: AC
  Administered 2020-09-26: 324 mg via ORAL
  Filled 2020-09-26: qty 4

## 2020-09-26 MED ORDER — PANTOPRAZOLE SODIUM 40 MG PO TBEC
40.0000 mg | DELAYED_RELEASE_TABLET | Freq: Every day | ORAL | Status: DC
  Administered 2020-09-26 – 2020-09-29 (×4): 40 mg via ORAL
  Filled 2020-09-26 (×4): qty 1

## 2020-09-26 MED ORDER — HYDRALAZINE HCL 20 MG/ML IJ SOLN: 10.0000 mg | Freq: Three times a day (TID) | INTRAMUSCULAR | Status: DC | PRN

## 2020-09-26 MED ORDER — HEPARIN (PORCINE) 25000 UT/250ML-% IV SOLN
750.0000 [IU]/h | INTRAVENOUS | Status: DC
  Administered 2020-09-26: 750 [IU]/h via INTRAVENOUS
  Filled 2020-09-26: qty 250

## 2020-09-26 MED ORDER — MAGNESIUM SULFATE 2 GM/50ML IV SOLN
2.0000 g | Freq: Once | INTRAVENOUS | Status: AC
  Administered 2020-09-26: 2 g via INTRAVENOUS
  Filled 2020-09-26: qty 50

## 2020-09-26 MED ORDER — POTASSIUM CHLORIDE 10 MEQ/100ML IV SOLN
10.0000 meq | INTRAVENOUS | Status: AC
  Administered 2020-09-26 (×4): 10 meq via INTRAVENOUS
  Filled 2020-09-26 (×4): qty 100

## 2020-09-26 MED ORDER — SODIUM CHLORIDE (PF) 0.9 % IJ SOLN
INTRAMUSCULAR | Status: AC
  Filled 2020-09-26: qty 50

## 2020-09-26 MED ORDER — GABAPENTIN 300 MG PO CAPS
300.0000 mg | ORAL_CAPSULE | Freq: Two times a day (BID) | ORAL | Status: DC
  Administered 2020-09-26 – 2020-09-29 (×7): 300 mg via ORAL
  Filled 2020-09-26 (×7): qty 1

## 2020-09-26 MED ORDER — HEPARIN BOLUS VIA INFUSION
3000.0000 [IU] | Freq: Once | INTRAVENOUS | Status: AC
  Administered 2020-09-26: 3000 [IU] via INTRAVENOUS
  Filled 2020-09-26: qty 3000

## 2020-09-26 MED ORDER — POLYETHYLENE GLYCOL 3350 17 G PO PACK: 17.0000 g | PACK | Freq: Every day | ORAL | Status: DC | PRN

## 2020-09-26 MED ORDER — ACETAMINOPHEN 650 MG RE SUPP: 650.0000 mg | Freq: Four times a day (QID) | RECTAL | Status: DC | PRN

## 2020-09-26 MED ORDER — PIPERACILLIN-TAZOBACTAM 3.375 G IVPB 30 MIN
3.3750 g | Freq: Once | INTRAVENOUS | Status: AC
  Administered 2020-09-26: 3.375 g via INTRAVENOUS
  Filled 2020-09-26: qty 50

## 2020-09-26 MED ORDER — SPIRONOLACTONE 25 MG PO TABS
25.0000 mg | ORAL_TABLET | Freq: Every day | ORAL | Status: DC
  Administered 2020-09-26 – 2020-09-29 (×4): 25 mg via ORAL
  Filled 2020-09-26 (×5): qty 1

## 2020-09-26 MED ORDER — ISOSORBIDE MONONITRATE ER 60 MG PO TB24
60.0000 mg | ORAL_TABLET | Freq: Every day | ORAL | Status: DC
  Administered 2020-09-26 – 2020-09-29 (×4): 60 mg via ORAL
  Filled 2020-09-26 (×4): qty 1

## 2020-09-26 MED ORDER — HEPARIN (PORCINE) 25000 UT/250ML-% IV SOLN
1350.0000 [IU]/h | INTRAVENOUS | Status: DC
  Administered 2020-09-27 – 2020-09-28 (×2): 1350 [IU]/h via INTRAVENOUS
  Filled 2020-09-26 (×2): qty 250

## 2020-09-26 MED ORDER — HYDROCODONE-ACETAMINOPHEN 5-325 MG PO TABS: 1.0000 | ORAL_TABLET | ORAL | Status: DC | PRN

## 2020-09-26 MED ORDER — LEFLUNOMIDE 20 MG PO TABS
10.0000 mg | ORAL_TABLET | Freq: Every day | ORAL | Status: DC
  Administered 2020-09-26 – 2020-09-29 (×4): 10 mg via ORAL
  Filled 2020-09-26 (×4): qty 0.5

## 2020-09-26 MED ORDER — HEPARIN (PORCINE) 25000 UT/250ML-% IV SOLN
950.0000 [IU]/h | INTRAVENOUS | Status: DC
  Administered 2020-09-26: 950 [IU]/h via INTRAVENOUS
  Filled 2020-09-26: qty 250

## 2020-09-26 MED ORDER — NITROGLYCERIN 2 % TD OINT
0.5000 [in_us] | TOPICAL_OINTMENT | Freq: Once | TRANSDERMAL | Status: AC
  Administered 2020-09-26: 0.5 [in_us] via TOPICAL

## 2020-09-26 MED ORDER — MORPHINE SULFATE (PF) 2 MG/ML IV SOLN: 2.0000 mg | INTRAVENOUS | Status: DC | PRN

## 2020-09-26 MED ORDER — IOHEXOL 350 MG/ML SOLN
80.0000 mL | Freq: Once | INTRAVENOUS | Status: AC | PRN
  Administered 2020-09-26: 80 mL via INTRAVENOUS

## 2020-09-26 MED ORDER — ONDANSETRON HCL 4 MG/2ML IJ SOLN: 4.0000 mg | Freq: Four times a day (QID) | INTRAMUSCULAR | Status: DC | PRN

## 2020-09-26 MED ORDER — ATORVASTATIN CALCIUM 40 MG PO TABS
40.0000 mg | ORAL_TABLET | Freq: Every day | ORAL | Status: DC
  Administered 2020-09-26 – 2020-09-27 (×2): 40 mg via ORAL
  Filled 2020-09-26 (×2): qty 1

## 2020-09-26 MED ORDER — FUROSEMIDE 10 MG/ML IJ SOLN
40.0000 mg | Freq: Two times a day (BID) | INTRAMUSCULAR | Status: DC
  Administered 2020-09-26 – 2020-09-27 (×3): 40 mg via INTRAVENOUS
  Filled 2020-09-26 (×3): qty 4

## 2020-09-26 NOTE — Progress Notes (Signed)
ANTICOAGULATION CONSULT NOTE - Initial Consult  Pharmacy Consult for Heparin Indication: chest pain/ACS  Allergies  Allergen Reactions  . Infliximab Nausea Only and Shortness Of Breath    Patient Measurements: Height: 5\' 3"  (160 cm) Weight: 63.5 kg (140 lb) IBW/kg (Calculated) : 56.9 Heparin Dosing Weight: 63.5 kg   Vital Signs: Temp: 99.3 F (37.4 C) (06/04 0126) Temp Source: Oral (06/04 0126) BP: 99/60 (06/04 1130) Pulse Rate: 83 (06/04 1130)  Labs: Recent Labs    09/26/20 0133 09/26/20 0216 09/26/20 0405 09/26/20 1030  HGB 9.8* 9.9*  --   --   HCT 31.5* 29.0*  --   --   PLT 321  --   --   --   HEPARINUNFRC  --   --   --  <0.10*  CREATININE 0.68 0.50*  --   --   TROPONINIHS 107*  --  144*  --     Estimated Creatinine Clearance: 80 mL/min (A) (by C-G formula based on SCr of 0.5 mg/dL (L)).   Medical History: Past Medical History:  Diagnosis Date  . Anemia    H/o using iron in the past   . Arthritis    rheumatoid  . Chronic back pain   . Hyperlipidemia   . Joint pain   . Joint swelling   . Rheumatoid arthritis(714.0)     Medications:  Infusions:  . heparin 750 Units/hr (09/26/20 0433)  . magnesium sulfate bolus IVPB 2 g (09/26/20 1149)    Assessment: 60 yo M who presented with shortness of breath. Chest CT negative for PE, Troponin elevated, CXR/EKG abnormal. Not on anticoagulation PTA. CBC- Hg low at 9.9, pltc WNL. Renal function at baseline.   Today, 09/26/20: Heparin level SUB-therapeutic at <0.10 with heparin infusing at 750 units/hour No infusion interruptions/issues and no bleeding per nurse  Goal of Therapy:  Heparin level 0.3-0.7 units/ml Monitor platelets by anticoagulation protocol: Yes   Plan:  Heparin 2000 units IV bolus then  Increase heparin infusion to 950 units/hr Check heparin level in 6hrs Monitor daily heparin level, CBC, s/s bleeding   Derrell Milanes P. Legrand Como, PharmD, [place] Please utilize  Amion for appropriate phone number to reach the unit pharmacist ([place]) 09/26/2020 12:01 PM

## 2020-09-26 NOTE — ED Notes (Signed)
Patient needs to be reminded to take deep breaths- Oxygen saturation will decrease when sleeping 88%- immediately returns to 91-94% when reminded

## 2020-09-26 NOTE — ED Notes (Signed)
The patient is resting quietly.  Awaiting admission.  Patient has poor skin hygiene and wound noted to the left foot.

## 2020-09-26 NOTE — ED Notes (Signed)
Jefferey Pica, RN was sent a secure chat message in regards to admission information and bed assignment

## 2020-09-26 NOTE — ED Provider Notes (Signed)
[facility] DEPT Provider Note   CSN: [id number] Arrival date & time: 09/26/20  0114     History Chief Complaint  Patient presents with  . Shortness of Breath    Jonathan [name] is a 60 y.o. male.  The history is provided by the patient and the EMS personnel.  Shortness of Breath Severity:  Severe Onset quality:  Gradual Timing:  Constant Progression:  Worsening Chronicity:  Recurrent Context: not smoke exposure   Relieved by:  Nothing Worsened by:  Nothing Ineffective treatments:  None tried Associated symptoms: chest pain   Associated symptoms: no diaphoresis, no fever, no rash and no vomiting   Risk factors: no recent surgery   Patient with a h/o CAD, CHF presents with CP and SOB at rest with noted hypoxia at rest to 80% while seated on the porch.  Moreover he has B leg wounds and marked RLE swelling.  He states he is not ambulatory.  He reports he is not on blood thinners.  No f/c/r.       Past Medical History:  Diagnosis Date  . Anemia    H/o using iron in the past   . Arthritis    rheumatoid  . Chronic back pain   . Hyperlipidemia   . Joint pain   . Joint swelling   . Rheumatoid arthritis(714.0)     Patient Active Problem List   Diagnosis Date Noted  . Acute diffuse otitis externa of left ear 03/07/2020  . Chronic total occlusion of coronary artery 12/26/2019  . Coronary artery disease involving native coronary artery of native heart 12/26/2019  . Fall at home 12/26/2019  . 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[place]) 12/26/2019  . 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 Physical debility 11/21/2019  . Syncope 11/21/2019  . DDD (degenerative disc disease), lumbar 09/27/2019  . Open wound of left foot 09/27/2019  . Open wound of right foot 09/27/2019  . Pain due to onychomycosis of toenails of both feet 08/28/2019  . Acute systolic CHF (congestive heart failure) ([place]) 08/22/2019  . Chronic pain syndrome  08/22/2019  . Rheumatoid arthritis ([place]) 08/22/2019  . Venous stasis ulcers ([place]) 08/22/2019  . Anemia, unspecified 08/22/2019  . NSTEMI (non-ST elevated myocardial infarction) ([place]) 08/21/2019  . Cervical spondylosis without myelopathy 08/21/2019  . Lumbar facet arthropathy 08/14/2019  . 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 B12 deficiency 02/08/2019  . Glucose intolerance (impaired glucose tolerance) 02/08/2019  . 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 Hyperglycemia 12/27/2018  . Cellulitis 12/27/2018  . Wound drainage 12/22/2018  . Foot ulcer ([place]) 12/21/2018  . Encounter for screening for HIV 12/21/2018  . Arthritis 08/22/2018  . HLD (hyperlipidemia) 08/22/2018  . Lung nodule 08/22/2018  . Renal cyst, right 08/22/2018  . Rheumatoid arthritis involving multiple sites with positive rheumatoid factor ([place]) 08/22/2018  . Venous insufficiency 08/22/2018  . Vitamin D deficiency 08/22/2018  . Seasonal allergic rhinitis 07/16/2018  . Essential hypertension 02/23/2018  . Insomnia 02/23/2018  . Tinea versicolor 02/23/2018  . Antalgic gait 09/21/2017  . Chronic pain of both knees 09/21/2017  . Foot pain, bilateral 09/21/2017  . Elevated liver enzymes 07/10/2017  . Leg edema 06/08/2017  . Primary osteoarthritis, left ankle and foot 05/09/2017  . Post-traumatic osteoarthritis, right ankle and foot 05/09/2017  . Primary osteoarthritis, right ankle and foot 05/09/2017  . Erectile dysfunction 02/15/2017  . Excessive sweating 02/15/2017  . Difficulty transferring 01/27/2017  . Acquired bilateral flat feet 11/15/2016  . Other acquired hammer toe 11/15/2016  .  Erosive gastritis 09/05/2016  . At high risk for falls 08/15/2016  . Chronic bilateral low back pain without sciatica 10/13/2015  . Drug therapy 08/03/2015  . Rheumatoid arthritis involving left hip ([place]) 01/29/2015  . 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 Status post total replacement of right hip 11/11/2014  . Gait difficulty 08/21/2013  . Benign neoplasm of colon 04/03/2013  . Reflux esophagitis 04/03/2013  . Microcytic anemia 04/01/2013  . Chronic ulcer of right leg ([place]) 01/13/2013  . Pressure ulcer 01/13/2013  . 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 Degenerative arthritis of knee 06/07/2011    Past Surgical History:  Procedure Laterality Date  . COLONOSCOPY N/A 04/03/2013   Procedure: COLONOSCOPY;  Surgeon: Irene Shipper, MD;  Location: [facility];  Service: Endoscopy;  Laterality: N/A;  . COLONOSCOPY    . ENDOVENOUS ABLATION SAPHENOUS VEIN W/ LASER Left 09/26/2019   endovenous laser ablation left greater saphenous vein by Gae Gallop MD   . ENDOVENOUS ABLATION SAPHENOUS VEIN W/ LASER Right 10/31/2019   endovenous laser ablation right greater saphenous vein by Gae Gallop MD   . ESOPHAGOGASTRODUODENOSCOPY N/A 04/03/2013   Procedure: ESOPHAGOGASTRODUODENOSCOPY (EGD);  Surgeon: Irene Shipper, MD;  Location: [facility];  Service: Endoscopy;  Laterality: N/A;  changed to moderate dr. Henrene Pastor did not want to go to the o.r. for an endo colon-jmt  . EYE SURGERY Left    "when i was a very small boy"  . JOINT REPLACEMENT     both knees  . KNEE ARTHROPLASTY  06/07/2011   Procedure: COMPUTER ASSISTED TOTAL KNEE ARTHROPLASTY;  Surgeon: Mcarthur Rossetti, MD;  Location: [facility];  Service: Orthopedics;  Laterality: Right;  Right total knee arthoplasty  . KNEE CLOSED REDUCTION  07/19/2011   Procedure: CLOSED MANIPULATION KNEE;  Surgeon: Mcarthur Rossetti, MD;  Location: [facility];  Service: Orthopedics;  Laterality: Right;  Manipulation under anesthesia right knee  . LEFT HEART CATH AND CORONARY ANGIOGRAPHY N/A 08/21/2019   Procedure: LEFT HEART CATH AND CORONARY ANGIOGRAPHY;  Surgeon: Belva Crome, MD;  Location: [facility];  Service: Cardiovascular;  Laterality: N/A;  . TOTAL HIP ARTHROPLASTY Right 11/11/2014    Procedure: RIGHT TOTAL HIP ARTHROPLASTY ANTERIOR APPROACH;  Surgeon: Mcarthur Rossetti, MD;  Location: [facility];  Service: Orthopedics;  Laterality: Right;  . TOTAL HIP ARTHROPLASTY Left 01/29/2015   Procedure: LEFT TOTAL HIP ARTHROPLASTY ANTERIOR APPROACH;  Surgeon: Mcarthur Rossetti, MD;  Location: [facility];  Service: Orthopedics;  Laterality: Left;       Family History  Problem Relation Age of Onset  . Cancer Mother        ?  Marland Kitchen Hypertension Mother   . Heart failure Father        heart transplant  . Cancer Brother   . Anesthesia problems Neg Hx   . Colon cancer Neg Hx        mother had "3" types of CA- unsure about which one  . Esophageal cancer Neg Hx   . Rectal cancer Neg Hx   . Stomach cancer Neg Hx     Social History   Tobacco Use  . Smoking status: Former Smoker    Types: Cigarettes    Quit date: 04/25/2004    Years since quitting: 16.4  . Smokeless tobacco: Never Used  Vaping Use  . Vaping Use: Never used  Substance Use Topics  .  Alcohol use: No  . Drug use: No    Home Medications Prior to Admission medications   Medication Sig Start Date End Date Taking? Authorizing Provider  acetaminophen (TYLENOL) 325 MG tablet Take 2 tablets (650 mg total) by mouth every 4 (four) hours as needed for headache or mild pain. 08/24/19   Allie Bossier, MD  aspirin EC 81 MG tablet Take 1 tablet (81 mg total) by mouth daily. 08/27/19   Minus Breeding, MD  atorvastatin (LIPITOR) 40 MG tablet Take 40 mg by mouth daily.    [provider]  cetirizine (ZYRTEC) 10 MG tablet Take 10 mg by mouth daily.  07/16/18   [provider]  cyclobenzaprine (FLEXERIL) 5 MG tablet Take 5 mg by mouth every 8 (eight) hours as needed. 07/10/19   [provider]  docusate sodium (COLACE) 100 MG capsule Take 1 capsule (100 mg total) by mouth 2 (two) times daily. 08/24/19   Allie Bossier, MD  fluticasone (FLONASE) 50 MCG/ACT nasal spray Place 1 spray into the nose daily.  07/16/18    [provider]  gabapentin (NEURONTIN) 300 MG capsule Take 300 mg by mouth 2 (two) times daily.  03/24/16   [provider]  inFLIXimab (REMICADE) 100 MG injection Inject 300 mg into the vein every 6 (six) weeks.  04/14/16   [provider]  isosorbide mononitrate (IMDUR) 60 MG 24 hr tablet Take 1 tablet (60 mg total) by mouth daily. 08/25/19   Allie Bossier, MD  leflunomide (ARAVA) 10 MG tablet Take 1 tablet by mouth daily. 05/20/20   [provider]  lisinopril (ZESTRIL) 5 MG tablet Take 1 tablet by mouth daily. 02/13/20 02/12/21  [provider]  meloxicam (MOBIC) 7.5 MG tablet Take 7.5 mg by mouth daily. 10/01/19   [provider]  metoprolol tartrate (LOPRESSOR) 25 MG tablet Take 12.5 mg by mouth 2 (two) times daily. 05/05/20   [provider]  montelukast (SINGULAIR) 10 MG tablet Take 10 mg by mouth at bedtime.  01/12/19   [provider]  neomycin-polymyxin-hydrocortisone (CORTISPORIN) 3.5-10000-1 OTIC suspension 4 drops 2 (two) times daily. 03/06/20   [provider]  omeprazole (PRILOSEC) 20 MG capsule Take 20 mg by mouth daily.     [provider]  ondansetron (ZOFRAN) 4 MG tablet Take 1 tablet (4 mg total) by mouth every 6 (six) hours as needed for nausea. 08/24/19   Allie Bossier, MD  oxyCODONE (ROXICODONE) 5 MG immediate release tablet Take 0.5 tablets (2.5 mg total) by mouth every 4 (four) hours as needed for severe pain. 09/21/19   Deno Etienne, DO  oxycodone-acetaminophen (PERCOCET) 2.5-325 MG tablet Take by mouth.    [provider]  predniSONE (DELTASONE) 5 MG tablet Take 5 mg by mouth 2 (two) times daily. 07/12/19   [provider]  traMADol (ULTRAM) 50 MG tablet Take 1 tablet (50 mg total) by mouth every 6 (six) hours as needed. 09/07/20   Milton Ferguson, MD  triamcinolone (KENALOG) 0.1 % Apply daily to flaky skin as needed 05/07/20   [provider]  rivaroxaban (XARELTO)  10 MG TABS tablet Take 10 mg by mouth daily with breakfast. 06/10/11 07/19/11  Mcarthur Rossetti, MD    Allergies    Infliximab  Review of Systems   Review of Systems  Constitutional: Negative for diaphoresis and fever.  HENT: Negative for facial swelling.   Eyes: Negative for redness.  Respiratory: Positive for shortness of breath.  Cardiovascular: Positive for chest pain.  Gastrointestinal: Negative for vomiting.  Genitourinary: Negative for flank pain.  Musculoskeletal: Positive for joint swelling.  Skin: Positive for wound. Negative for rash.  Neurological: Negative for facial asymmetry.  Psychiatric/Behavioral: Negative for agitation.  All other systems reviewed and are negative.   Physical Exam Updated Vital Signs BP (!) 146/73   Pulse (!) 105   Temp 99.3 F (37.4 C) (Oral)   Resp (!) 21   Ht 5\' 3"  (1.6 m)   Wt 63.5 kg   SpO2 93%   BMI 24.80 kg/m   Physical Exam Vitals and nursing note reviewed.  Constitutional:      Appearance: Normal appearance. He is not diaphoretic.  HENT:     Head: Normocephalic and atraumatic.     Nose: Nose normal.  Eyes:     Conjunctiva/sclera: Conjunctivae normal.     Pupils: Pupils are equal, round, and reactive to light.  Cardiovascular:     Rate and Rhythm: Regular rhythm. Tachycardia present.     Pulses: Normal pulses.     Heart sounds: Normal heart sounds.  Pulmonary:     Effort: Tachypnea present.     Breath sounds: No stridor. Decreased breath sounds present.  Abdominal:     General: Abdomen is flat. Bowel sounds are normal.     Palpations: Abdomen is soft.     Tenderness: There is no abdominal tenderness. There is no guarding.  Musculoskeletal:     Cervical back: Normal range of motion and neck supple.  Skin:    General: Skin is warm and dry.     Capillary Refill: Capillary refill takes less than 2 seconds.       Neurological:     General: No focal deficit present.     Mental Status: He is alert and oriented  to person, place, and time.     Deep Tendon Reflexes: Reflexes normal.  Psychiatric:        Mood and Affect: Mood normal.        Behavior: Behavior normal.     ED Results / Procedures / Treatments   Labs (all labs ordered are listed, but only abnormal results are displayed) Results for orders placed or performed during the hospital encounter of 09/26/20  Resp Panel by RT-PCR (Flu A&B, Covid) Nasopharyngeal Swab   Specimen: Nasopharyngeal Swab; Nasopharyngeal(NP) swabs in vial transport medium  Result Value Ref Range   SARS Coronavirus 2 by RT PCR NEGATIVE NEGATIVE   Influenza A by PCR NEGATIVE NEGATIVE   Influenza B by PCR NEGATIVE NEGATIVE  CBC with Differential/Platelet  Result Value Ref Range   WBC 8.3 4.0 - 10.5 K/uL   RBC 4.02 (L) 4.22 - 5.81 MIL/uL   Hemoglobin 9.8 (L) 13.0 - 17.0 g/dL   HCT 31.5 (L) 39.0 - 52.0 %   MCV 78.4 (L) 80.0 - 100.0 fL   MCH 24.4 (L) 26.0 - 34.0 pg   MCHC 31.1 30.0 - 36.0 g/dL   RDW 15.4 11.5 - 15.5 %   Platelets 321 150 - 400 K/uL   nRBC 0.0 0.0 - 0.2 %   Neutrophils Relative % 82 %   Neutro Abs 6.8 1.7 - 7.7 K/uL   Lymphocytes Relative 5 %   Lymphs Abs 0.5 (L) 0.7 - 4.0 K/uL   Monocytes Relative 6 %   Monocytes Absolute 0.5 0.1 - 1.0 K/uL   Eosinophils Relative 6 %   Eosinophils Absolute 0.5 0.0 - 0.5 K/uL   Basophils Relative 0 %  Basophils Absolute 0.0 0.0 - 0.1 K/uL   Immature Granulocytes 1 %   Abs Immature Granulocytes 0.05 0.00 - 0.07 K/uL  Comprehensive metabolic panel  Result Value Ref Range   Sodium 137 135 - 145 mmol/L   Potassium 4.1 3.5 - 5.1 mmol/L   Chloride 105 98 - 111 mmol/L   CO2 23 22 - 32 mmol/L   Glucose, Bld 124 (H) 70 - 99 mg/dL   BUN 8 6 - 20 mg/dL   Creatinine, Ser 0.68 0.61 - 1.24 mg/dL   Calcium 8.8 (L) 8.9 - 10.3 mg/dL   Total Protein 6.9 6.5 - 8.1 g/dL   Albumin 2.9 (L) 3.5 - 5.0 g/dL   AST 34 15 - 41 U/L   ALT 12 0 - 44 U/L   Alkaline Phosphatase 75 38 - 126 U/L   Total Bilirubin 1.3 (H) 0.3 -  1.2 mg/dL   GFR, Estimated >60 >60 mL/min   Anion gap 9 5 - 15  Brain natriuretic peptide  Result Value Ref Range   B Natriuretic Peptide 488.9 (H) 0.0 - 100.0 pg/mL  I-stat chem 8, ED (not at [facility] or [facility])  Result Value Ref Range   Sodium 139 135 - 145 mmol/L   Potassium 3.2 (L) 3.5 - 5.1 mmol/L   Chloride 104 98 - 111 mmol/L   BUN 7 6 - 20 mg/dL   Creatinine, Ser 0.50 (L) 0.61 - 1.24 mg/dL   Glucose, Bld 131 (H) 70 - 99 mg/dL   Calcium, Ion 1.16 1.15 - 1.40 mmol/L   TCO2 27 22 - 32 mmol/L   Hemoglobin 9.9 (L) 13.0 - 17.0 g/dL   HCT 29.0 (L) 39.0 - 52.0 %  Troponin I (High Sensitivity)  Result Value Ref Range   Troponin I (High Sensitivity) 107 (HH) <18 ng/L   DG Chest 2 View  Result Date: 09/07/2020 CLINICAL DATA:  Chest pain EXAM: CHEST - 2 VIEW COMPARISON:  09/21/2019 FINDINGS: Cardiomediastinal contours are within normal limits. Atherosclerotic calcification of the aortic knob. No focal airspace consolidation, pleural effusion, or pneumothorax. Chronic deformities of the bilateral proximal humeri. IMPRESSION: No active cardiopulmonary disease. Electronically Signed   By: Davina Poke D.O.   On: 09/07/2020 08:00   CT Angio Chest PE W and/or Wo Contrast  Result Date: 09/26/2020 CLINICAL DATA:  Chest pain.  Effusion suspected. EXAM: CT ANGIOGRAPHY CHEST WITH CONTRAST TECHNIQUE: Multidetector CT imaging of the chest was performed using the standard protocol during bolus administration of intravenous contrast. Multiplanar CT image reconstructions and MIPs were obtained to evaluate the vascular anatomy. CONTRAST:  77mL OMNIPAQUE IOHEXOL 350 MG/ML SOLN COMPARISON:  None. FINDINGS: Cardiovascular: Satisfactory opacification of the pulmonary arteries to the proximal segmental level. No evidence of pulmonary embolism. Limited evaluation at the subsegmental level due to respiratory motion artifact. Normal heart size. No pericardial effusion. The thoracic aorta is normal in caliber. Mild  atherosclerotic plaque. Mediastinum/Nodes: Bilateral axillary lymphadenopathy. No enlarged mediastinal or hilar lymph nodes. Thyroid gland, trachea, and esophagus demonstrate no significant findings. Lungs/Pleura: Slightly more consolidative airspace opacities at the lung bases. Diffuse, lower lobe predominant, peribronchovascular airspace opacities. Bilateral trace pleural effusions. No pneumothorax. Upper Abdomen: No acute abnormality. Musculoskeletal: No chest wall abnormality. Severe chronic degenerative changes of bilateral shoulders with cortical erosion and destruction of the femoral heads and acetabuli. Question underlying joint effusions. Review of the MIP images confirms the above findings. IMPRESSION: 1. No central or proximal segmental pulmonary embolus with limited evaluation more distally due to motion  artifact. 2. Diffuse, lower lobe predominant, peribronchovascular airspace opacities. Bilateral trace pleural effusions. Findings could represent pulmonary edema versus infection/inflammation. 3. Severe chronic degenerative changes of bilateral shoulders with cortical erosion and destruction of the femoral heads and acetabuli. Question underlying joint effusions. 4. Bilateral axillary lymphadenopathy. Electronically Signed   By: Iven Finn M.D.   On: 09/26/2020 03:48   DG Chest Portable 1 View  Result Date: 09/26/2020 CLINICAL DATA:  Shortness of breath EXAM: PORTABLE CHEST 1 VIEW COMPARISON:  09/07/2020 FINDINGS: Moderate cardiomegaly. Bilateral basilar predominant interstitial opacities. No pleural effusion or pneumothorax. IMPRESSION: Cardiomegaly and bilateral basilar predominant interstitial opacities, likely pulmonary edema. Electronically Signed   By: Ulyses Jarred M.D.   On: 09/26/2020 03:13    EKG EKG Interpretation  Date/Time:  Saturday September 26 2020 01:27:56 EDT Ventricular Rate:  115 PR Interval:  180 QRS Duration: 98 QT Interval:  300 QTC Calculation: 415 R  Axis:   56 Text Interpretation: Sinus tachycardia Ventricular premature complex Probable left ventricular hypertrophy Confirmed by Dory Horn) on 09/26/2020 1:31:41 AM   Radiology CT Angio Chest PE W and/or Wo Contrast  Result Date: 09/26/2020 CLINICAL DATA:  Chest pain.  Effusion suspected. EXAM: CT ANGIOGRAPHY CHEST WITH CONTRAST TECHNIQUE: Multidetector CT imaging of the chest was performed using the standard protocol during bolus administration of intravenous contrast. Multiplanar CT image reconstructions and MIPs were obtained to evaluate the vascular anatomy. CONTRAST:  57mL OMNIPAQUE IOHEXOL 350 MG/ML SOLN COMPARISON:  None. FINDINGS: Cardiovascular: Satisfactory opacification of the pulmonary arteries to the proximal segmental level. No evidence of pulmonary embolism. Limited evaluation at the subsegmental level due to respiratory motion artifact. Normal heart size. No pericardial effusion. The thoracic aorta is normal in caliber. Mild atherosclerotic plaque. Mediastinum/Nodes: Bilateral axillary lymphadenopathy. No enlarged mediastinal or hilar lymph nodes. Thyroid gland, trachea, and esophagus demonstrate no significant findings. Lungs/Pleura: Slightly more consolidative airspace opacities at the lung bases. Diffuse, lower lobe predominant, peribronchovascular airspace opacities. Bilateral trace pleural effusions. No pneumothorax. Upper Abdomen: No acute abnormality. Musculoskeletal: No chest wall abnormality. Severe chronic degenerative changes of bilateral shoulders with cortical erosion and destruction of the femoral heads and acetabuli. Question underlying joint effusions. Review of the MIP images confirms the above findings. IMPRESSION: 1. No central or proximal segmental pulmonary embolus with limited evaluation more distally due to motion artifact. 2. Diffuse, lower lobe predominant, peribronchovascular airspace opacities. Bilateral trace pleural effusions. Findings could represent  pulmonary edema versus infection/inflammation. 3. Severe chronic degenerative changes of bilateral shoulders with cortical erosion and destruction of the femoral heads and acetabuli. Question underlying joint effusions. 4. Bilateral axillary lymphadenopathy. Electronically Signed   By: Iven Finn M.D.   On: 09/26/2020 03:48   DG Chest Portable 1 View  Result Date: 09/26/2020 CLINICAL DATA:  Shortness of breath EXAM: PORTABLE CHEST 1 VIEW COMPARISON:  09/07/2020 FINDINGS: Moderate cardiomegaly. Bilateral basilar predominant interstitial opacities. No pleural effusion or pneumothorax. IMPRESSION: Cardiomegaly and bilateral basilar predominant interstitial opacities, likely pulmonary edema. Electronically Signed   By: Ulyses Jarred M.D.   On: 09/26/2020 03:13    Procedures Procedures   Medications Ordered in ED Medications  vancomycin (VANCOCIN) IVPB 1000 mg/200 mL premix (has no administration in time range)  piperacillin-tazobactam (ZOSYN) IVPB 3.375 g (has no administration in time range)  furosemide (LASIX) injection 40 mg (has no administration in time range)  aspirin chewable tablet 324 mg (has no administration in time range)  nitroGLYCERIN (NITROGLYN) 2 % ointment 0.5 inch (has no administration in  time range)  heparin ADULT infusion 100 units/mL (25000 units/235mL) (has no administration in time range)  sodium chloride (PF) 0.9 % injection (  Given by Other 09/26/20 0212)  iohexol (OMNIPAQUE) 350 MG/ML injection 80 mL (80 mLs Intravenous Contrast Given 09/26/20 0238)    ED Course  I have reviewed the triage vital signs and the nursing notes.  Pertinent labs & imaging results that were available during my care of the patient were reviewed by me and considered in my medical decision making (see chart for details).    MDM Reviewed: previous chart, nursing note and vitals Reviewed previous: labs Interpretation: ECG, labs, x-ray and CT scan (elevated troponin, elevated bnp,  edema on  CXR by me ) Total time providing critical care: 30-74 minutes (heparin drip ). This excludes time spent performing separately reportable procedures and services. Consults: admitting MD  CRITICAL CARE Performed by: Ann Bohne K Tarvaris Puglia-Rasch Total critical care time: 60 minutes Critical care time was exclusive of separately billable procedures and treating other patients. Critical care was necessary to treat or prevent imminent or life-threatening deterioration. Critical care was time spent personally by me on the following activities: development of treatment plan with patient and/or surrogate as well as nursing, discussions with consultants, evaluation of patient's response to treatment, examination of patient, obtaining history from patient or surrogate, ordering and performing treatments and interventions, ordering and review of laboratory studies, ordering and review of radiographic studies, pulse oximetry and re-evaluation of patient's condition.  Final Clinical Impression(s) / ED Diagnoses Final diagnoses:  Acute pulmonary edema (HCC)  Wound infection  Elevated troponin I level  Hypoxia  Right leg swelling  Lymphadenopathy    Admit to medicine    Danaye Sobh, MD 09/26/20 [id number]

## 2020-09-26 NOTE — H&P (Signed)
History and Physical        Hospital Admission Note Date: 09/26/2020  Patient name: Jonathan [name] Medical record number: [id number] Date of birth: [birth year] Age: 60 y.o. Gender: male  PCP: System, Provider Not In    Chief Complaint    Chief Complaint  Patient presents with  . 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  Upon EMS arrival, HR 140 and O2 was 80% on RA and required 15 L NRB.  He was given albuterol and 125 mg Solu-Medrol and upon arrival he was 96% on 3 L/min.  Of note, patient was recently seen in the ED on 5/16 for chest pain thought to be musculoskeletal after negative VQ scan.  Currently, he says he still has some central chest pain and shortness of breath similar to when he arrived.  Denies any fever, nausea, vomiting or any other complaints.   ED Course: Afebrile, tachycardic, tachypneic,  hemodynamically stable, placed on 3 L/min. Notable Labs: Sodium 137, K4.1, glucose 124, BUN 8, creatinine 0.68, BNP 489, troponin 107-> 144, WBC 8.3, Hb 9.8, platelets 321, COVID-19 and flu negative. Notable Imaging: CXR-cardiomegaly and bibasilar interstitial opacities likely pulmonary edema.  CTA chest- diffuse lower lobe peribronchovascular airspace opacities, bilateral trace pleural effusions which could be pulmonary edema versus infection/inflammation or; severe chronic degenerative changes of bilateral shoulders question underlying joint effusions; bilateral axillary lymphadenopathy; negative for PE. Patient received aspirin 324 mg, Lasix 40 mg IV x1, nitro ointment,  vancomycin, Zosyn, heparin bolus and drip.  Cardiology consulted.    Vitals:   09/26/20 0945 09/26/20 1000  BP: 124/67 115/67  Pulse: 94 82  Resp: 16 20  Temp:    SpO2:  97%     Review of Systems:  Review of Systems  All other systems reviewed and are negative.   Medical/Social/Family History   Past Medical History: Past Medical History:  Diagnosis Date  . Anemia    H/o using iron in the past   . Arthritis    rheumatoid  . Chronic back pain   . Hyperlipidemia   . Joint pain   . Joint swelling   . Rheumatoid arthritis(714.0)     Past Surgical History:  Procedure Laterality Date  . COLONOSCOPY N/A 04/03/2013   Procedure: COLONOSCOPY;  Surgeon: Irene Shipper, MD;  Location: [facility];  Service: Endoscopy;  Laterality: N/A;  . COLONOSCOPY    . ENDOVENOUS ABLATION SAPHENOUS VEIN W/ LASER Left 09/26/2019   endovenous laser ablation left greater saphenous vein by Gae Gallop MD   . ENDOVENOUS ABLATION SAPHENOUS VEIN W/ LASER Right 10/31/2019   endovenous laser ablation right greater saphenous vein by Gae Gallop MD   . ESOPHAGOGASTRODUODENOSCOPY N/A 04/03/2013   Procedure: ESOPHAGOGASTRODUODENOSCOPY (EGD);  Surgeon: Irene Shipper, MD;  Location: [facility];  Service: Endoscopy;  Laterality: N/A;  changed to moderate dr. Henrene Pastor did not want to go to  the o.r. for an endo colon-jmt  . EYE SURGERY Left    "when i was a very small boy"  . JOINT REPLACEMENT     both knees  . KNEE ARTHROPLASTY  06/07/2011   Procedure: COMPUTER ASSISTED TOTAL KNEE ARTHROPLASTY;  Surgeon: Mcarthur Rossetti, MD;  Location: [facility];  Service: Orthopedics;  Laterality: Right;  Right total knee arthoplasty  . KNEE CLOSED REDUCTION  07/19/2011   Procedure: CLOSED MANIPULATION KNEE;  Surgeon: Mcarthur Rossetti, MD;  Location: [place];  Service: Orthopedics;  Laterality: Right;  Manipulation under anesthesia right knee  . LEFT HEART CATH AND CORONARY ANGIOGRAPHY N/A 08/21/2019   Procedure:  LEFT HEART CATH AND CORONARY ANGIOGRAPHY;  Surgeon: Belva Crome, MD;  Location: [facility];  Service: Cardiovascular;  Laterality: N/A;  . TOTAL HIP ARTHROPLASTY Right 11/11/2014   Procedure: RIGHT TOTAL HIP ARTHROPLASTY ANTERIOR APPROACH;  Surgeon: Mcarthur Rossetti, MD;  Location: [facility];  Service: Orthopedics;  Laterality: Right;  . TOTAL HIP ARTHROPLASTY Left 01/29/2015   Procedure: LEFT TOTAL HIP ARTHROPLASTY ANTERIOR APPROACH;  Surgeon: Mcarthur Rossetti, MD;  Location: [facility];  Service: Orthopedics;  Laterality: Left;    Medications: Prior to Admission medications   Medication Sig Start Date End Date Taking? Authorizing Provider  acetaminophen (TYLENOL) 325 MG tablet Take 2 tablets (650 mg total) by mouth every 4 (four) hours as needed for headache or mild pain. 08/24/19  Yes Allie Bossier, MD  aspirin EC 81 MG tablet Take 1 tablet (81 mg total) by mouth daily. 08/27/19  Yes Minus Breeding, MD  atorvastatin (LIPITOR) 40 MG tablet Take 40 mg by mouth daily.   Yes [provider]  celecoxib (CELEBREX) 200 MG capsule Take 200 mg by mouth 2 (two) times daily as needed. 09/14/20  Yes [provider]  cetirizine (ZYRTEC) 10 MG tablet Take 10 mg by mouth daily as needed. 07/16/18  Yes [provider]  docusate sodium (COLACE) 100 MG capsule Take 1 capsule (100 mg total) by mouth 2 (two) times daily. Patient taking differently: Take 100 mg by mouth daily as needed. 08/24/19  Yes Allie Bossier, MD  fluticasone [facility]) 50 MCG/ACT nasal spray Place 1 spray into the nose daily as needed for allergies. 07/16/18  Yes [provider]  gabapentin (NEURONTIN) 300 MG capsule Take 300 mg by mouth 2 (two) times daily.  03/24/16  Yes [provider]  isosorbide mononitrate (IMDUR) 60 MG 24 hr tablet Take 1 tablet (60 mg total) by mouth daily. 08/25/19  Yes Allie Bossier, MD  leflunomide (ARAVA) 10 MG tablet Take 1 tablet by mouth daily. 05/20/20  Yes  [provider]  lisinopril (ZESTRIL) 5 MG tablet Take 1 tablet by mouth daily. 02/13/20 02/12/21 Yes [provider]  meloxicam (MOBIC) 7.5 MG tablet Take 7.5 mg by mouth daily. 10/01/19  Yes [provider]  metoprolol tartrate (LOPRESSOR) 100 MG tablet Take 100 mg by mouth daily. 05/05/20  Yes [provider]  neomycin-polymyxin-hydrocortisone (CORTISPORIN) 3.5-10000-1 OTIC suspension 4 drops 2 (two) times daily. 03/06/20  Yes [provider]  omeprazole (PRILOSEC) 20 MG capsule Take 20 mg by mouth daily.    Yes [provider]  spironolactone (ALDACTONE) 25 MG tablet Take 1 tablet by mouth daily. 09/09/20  Yes [provider]  triamcinolone (KENALOG) 0.1 % Apply daily to flaky skin as needed 05/07/20  Yes [provider]  rivaroxaban (XARELTO) 10 MG TABS tablet Take 10  mg by mouth daily with breakfast. 06/10/11 07/19/11  Mcarthur Rossetti, MD    Allergies:   Allergies  Allergen Reactions  . Infliximab Nausea Only and Shortness Of Breath    Social History:  reports that he quit smoking about 16 years ago. His smoking use included cigarettes. He has never used smokeless tobacco. He reports that he does not drink alcohol and does not use drugs.  Family History: Family History  Problem Relation Age of Onset  . Cancer Mother        ?  Marland Kitchen Hypertension Mother   . Heart failure Father        heart transplant  . Cancer Brother   . Anesthesia problems Neg Hx   . Colon cancer Neg Hx        mother had "3" types of CA- unsure about which one  . Esophageal cancer Neg Hx   . Rectal cancer Neg Hx   . Stomach cancer Neg Hx      Objective   Physical Exam: Blood pressure 115/67, pulse 82, temperature 99.3 F (37.4 C), temperature source Oral, resp. rate 20, height 5\' 3"  (1.6 m), weight 63.5 kg, SpO2 97 %.  Physical Exam Vitals and nursing note reviewed.  Constitutional:      General: He is not in acute distress.     Comments: Chronically ill-appearing  Eyes:     Conjunctiva/sclera: Conjunctivae normal.  Cardiovascular:     Rate and Rhythm: Normal rate and regular rhythm.     Heart sounds: No murmur heard.   Pulmonary:     Effort: Pulmonary effort is normal.     Breath sounds: Normal breath sounds. No wheezing.  Abdominal:     Palpations: Abdomen is soft.  Musculoskeletal:     Comments: Bilateral lower extremity trace to 1+ edema, R> L  Skin:    Comments: Left lower extremity wounds  Neurological:     Mental Status: He is alert. Mental status is at baseline.  Psychiatric:        Mood and Affect: Mood normal.        Behavior: Behavior normal.         LABS on Admission: I have personally reviewed all the labs and imaging below    Basic Metabolic Panel: Recent Labs  Lab 09/26/20 0133 09/26/20 0216 09/26/20 0405  NA 137 139  --   K 4.1 3.2*  --   CL 105 104  --   CO2 23  --   --   GLUCOSE 124* 131*  --   BUN 8 7  --   CREATININE 0.68 0.50*  --   CALCIUM 8.8*  --   --   MG  --   --  1.7   Liver Function Tests: Recent Labs  Lab 09/26/20 0133  AST 34  ALT 12  ALKPHOS 75  BILITOT 1.3*  PROT 6.9  ALBUMIN 2.9*   No results for input(s): LIPASE, AMYLASE in the last 168 hours. No results for input(s): AMMONIA in the last 168 hours. CBC: Recent Labs  Lab 09/26/20 0133 09/26/20 0216  WBC 8.3  --   NEUTROABS 6.8  --   HGB 9.8* 9.9*  HCT 31.5* 29.0*  MCV 78.4*  --   PLT 321  --    Cardiac Enzymes: No results for input(s): CKTOTAL, CKMB, CKMBINDEX, TROPONINI in the last 168 hours. BNP: Invalid input(s): POCBNP CBG: No results for input(s): GLUCAP in the last 168 hours.  Radiological Exams on  Admission:  CT Angio Chest PE W and/or Wo Contrast  Result Date: 09/26/2020 CLINICAL DATA:  Chest pain.  Effusion suspected. EXAM: CT ANGIOGRAPHY CHEST WITH CONTRAST TECHNIQUE: Multidetector CT imaging of the chest was performed using the standard protocol during bolus  administration of intravenous contrast. Multiplanar CT image reconstructions and MIPs were obtained to evaluate the vascular anatomy. CONTRAST:  76mL OMNIPAQUE IOHEXOL 350 MG/ML SOLN COMPARISON:  None. FINDINGS: Cardiovascular: Satisfactory opacification of the pulmonary arteries to the proximal segmental level. No evidence of pulmonary embolism. Limited evaluation at the subsegmental level due to respiratory motion artifact. Normal heart size. No pericardial effusion. The thoracic aorta is normal in caliber. Mild atherosclerotic plaque. Mediastinum/Nodes: Bilateral axillary lymphadenopathy. No enlarged mediastinal or hilar lymph nodes. Thyroid gland, trachea, and esophagus demonstrate no significant findings. Lungs/Pleura: Slightly more consolidative airspace opacities at the lung bases. Diffuse, lower lobe predominant, peribronchovascular airspace opacities. Bilateral trace pleural effusions. No pneumothorax. Upper Abdomen: No acute abnormality. Musculoskeletal: No chest wall abnormality. Severe chronic degenerative changes of bilateral shoulders with cortical erosion and destruction of the femoral heads and acetabuli. Question underlying joint effusions. Review of the MIP images confirms the above findings. IMPRESSION: 1. No central or proximal segmental pulmonary embolus with limited evaluation more distally due to motion artifact. 2. Diffuse, lower lobe predominant, peribronchovascular airspace opacities. Bilateral trace pleural effusions. Findings could represent pulmonary edema versus infection/inflammation. 3. Severe chronic degenerative changes of bilateral shoulders with cortical erosion and destruction of the femoral heads and acetabuli. Question underlying joint effusions. 4. Bilateral axillary lymphadenopathy. Electronically Signed   By: Iven Finn M.D.   On: 09/26/2020 03:48   DG Chest Portable 1 View  Result Date: 09/26/2020 CLINICAL DATA:  Shortness of breath EXAM: PORTABLE CHEST 1 VIEW  COMPARISON:  09/07/2020 FINDINGS: Moderate cardiomegaly. Bilateral basilar predominant interstitial opacities. No pleural effusion or pneumothorax. IMPRESSION: Cardiomegaly and bilateral basilar predominant interstitial opacities, likely pulmonary edema. Electronically Signed   By: Ulyses Jarred M.D.   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 Chest pain  acute on chronic CHF with history of ischemic cardiomyopathy a. EKG without any pathologic ST changes and similar to baseline and troponin minimally elevated from 107-144 b. BNP 489 with pulmonary edema on chest imaging c. Hypoxic at home, will ask the RN to document SpO2 on room air to check for hypoxia here d. Continue heparin drip for now, pending cardiology recommendations e. Continue IV diuresis twice daily f. Continue home spironolactone, Imdur g. Holding home NSAIDs h. Holding home beta-blocker due to pulmonary edema i. Echo j. Telemetry  2. Hypokalemia a. Replace  3. Hypomagnesemia a. Replace  4. Chronic lower extremity wounds with history of PVD s/p bilateral great saphenous vein laser ablations a. Does not appear to have cellulitis b. Wound care  5. Chronic pain  Rheumatoid arthritis, at baseline a. Continue home leflunomide and gabapentin b. Holding home NSAIDs in the setting of heart failure  6. Hypertension a. Hold home lisinopril and Lopressor b. Continue spironolactone  7. Hyperlipidemia a. Continue statin  DVT prophylaxis: Heparin drip   Code Status: Full Code  Diet: Heart healthy, fluid restriction Family  Communication: Admission, patients condition and plan of care including tests being ordered have been discussed with the patient who indicates understanding and agrees with the plan and Code Status.  Called family without response Disposition Plan: The appropriate patient status for this patient is INPATIENT. Inpatient status is judged to be reasonable and necessary in order to provide the required intensity of service to ensure the patient's safety. The patient's presenting symptoms, physical exam findings, and initial radiographic and laboratory data in the context of their chronic comorbidities is felt to place them at high risk for further clinical deterioration. Furthermore, it is not anticipated that the patient will be medically stable for discharge from the hospital within 2 midnights of admission. The following factors support the patient status of inpatient.   " The patient's presenting symptoms include shortness of breath and chest pain. " The worrisome physical exam findings include chronic lower extremity wounds,. " The initial radiographic and laboratory data are worrisome because of elevated troponin and BNP. " The chronic co-morbidities include CHF, CAD, chronic pain, RA.   * I certify that at the point of admission it is my clinical judgment that the patient will require inpatient hospital care spanning beyond 2 midnights from the point of admission due to high intensity of service, high risk for further deterioration and high frequency of surveillance required.*   Status is: Inpatient  Remains inpatient appropriate because:IV treatments appropriate due to intensity of illness or inability to take PO and Inpatient level of care appropriate due to severity of illness   Dispo: The patient is from: Home              Anticipated d/c is to: Home              Patient currently is not medically stable to d/c.   Difficult to place patient No         The medical decision making on  this patient was of high complexity and the patient is at high risk for clinical deterioration, therefore this is a level 3 admission.  Consultants  . Cardiology  Procedures  . None  Time Spent on Admission: 70 minutes    Harold Hedge, DO [facility]  09/26/2020, 10:26 AM

## 2020-09-26 NOTE — Progress Notes (Signed)
ANTICOAGULATION CONSULT NOTE - Initial Consult  Pharmacy Consult for Heparin Indication: chest pain/ACS  Allergies  Allergen Reactions  . Infliximab Nausea Only and Shortness Of Breath    Patient Measurements: Height: 5\' 3"  (160 cm) Weight: 63.5 kg (140 lb) IBW/kg (Calculated) : 56.9 Heparin Dosing Weight: TBW   Vital Signs: Temp: 99.3 F (37.4 C) (06/04 0126) Temp Source: Oral (06/04 0126) BP: 146/73 (06/04 0330) Pulse Rate: 105 (06/04 0330)  Labs: Recent Labs    09/26/20 0133 09/26/20 0216  HGB 9.8* 9.9*  HCT 31.5* 29.0*  PLT 321  --   CREATININE 0.68 0.50*  TROPONINIHS 107*  --     Estimated Creatinine Clearance: 80 mL/min (A) (by C-G formula based on SCr of 0.5 mg/dL (L)).   Medical History: Past Medical History:  Diagnosis Date  . Anemia    H/o using iron in the past   . Arthritis    rheumatoid  . Chronic back pain   . Hyperlipidemia   . Joint pain   . Joint swelling   . Rheumatoid arthritis(714.0)     Medications:  Infusions:  . heparin    . piperacillin-tazobactam 3.375 g (09/26/20 0404)  . vancomycin 1,000 mg (09/26/20 0403)    Assessment: 60 yo M who presented with shortness of breath. Chest CT negative for PE, Troponin elevated, CXR/EKG abnormal. Not on anticoagulation PTA. CBC- Hg low at 9.9, pltc WNL. Renal function at baseline.   Goal of Therapy:  Heparin level 0.3-0.7 units/ml Monitor platelets by anticoagulation protocol: Yes   Plan:  Heparin 3000 units IV bolus followed by heparin infusion at 750 units/hr Check heparin level in 6hrs Daily heparin level & CBC while on heparin  Netta Cedars PharmD 09/26/2020,4:18 AM

## 2020-09-26 NOTE — Progress Notes (Signed)
[facility] for Heparin Indication: chest pain/ACS  Allergies  Allergen Reactions  . Infliximab Nausea Only and Shortness Of Breath    Patient Measurements: Height: 5\' 3"  (160 cm) Weight: 63.5 kg (140 lb) IBW/kg (Calculated) : 56.9 Heparin Dosing Weight: 63.5 kg   Vital Signs: Temp: 98 F (36.7 C) (06/04 1813) Temp Source: Oral (06/04 1813) BP: 94/59 (06/04 1813) Pulse Rate: 68 (06/04 1813)  Labs: Recent Labs    09/26/20 0133 09/26/20 0216 09/26/20 0405 09/26/20 1030 09/26/20 1315 09/26/20 1827  HGB 9.8* 9.9*  --   --   --   --   HCT 31.5* 29.0*  --   --   --   --   PLT 321  --   --   --   --   --   HEPARINUNFRC  --   --   --  <0.10*  --  0.10*  CREATININE 0.68 0.50*  --   --   --   --   TROPONINIHS 107*  --  144*  --  84*  --     Estimated Creatinine Clearance: 80 mL/min (A) (by C-G formula based on SCr of 0.5 mg/dL (L)).   Medical History: Past Medical History:  Diagnosis Date  . Anemia    H/o using iron in the past   . Arthritis    rheumatoid  . Chronic back pain   . Hyperlipidemia   . Joint pain   . Joint swelling   . Rheumatoid arthritis(714.0)     Medications:  Infusions:  . heparin 950 Units/hr (09/26/20 1249)    Assessment: 60 yo M who presented with shortness of breath. Chest CT negative for PE, Troponin elevated, CXR/EKG abnormal. Not on anticoagulation PTA. CBC- Hg low at 9.9, pltc WNL. Renal function at baseline.   Today, 09/26/20: Heparin level SUB-therapeutic at 0.10 with heparin infusing at 950 units/hour No infusion interruptions/issues and no bleeding per nurse  Goal of Therapy:  Heparin level 0.3-0.7 units/ml Monitor platelets by anticoagulation protocol: Yes   Plan:  Heparin 2000 units IV bolus then  Increase heparin infusion to 1150 units/hr Check heparin level in 6hrs Monitor daily heparin level, CBC, s/s bleeding   Yuuki Skeens P. Legrand Como, PharmD, [place] Please utilize [facility] for appropriate phone number to reach the unit pharmacist ([place]) 09/26/2020 7:25 PM

## 2020-09-26 NOTE — ED Notes (Signed)
Called lab to check on troponin. She said it is almost done running.

## 2020-09-26 NOTE — ED Triage Notes (Signed)
Patient BIB [facility] from home. Patient was sitting on porch initial saturation was 80% room air, put on 10L NRB and increased to 88%, EMS increased to 15L NRB 97%. HR 140 initially. EMS gave albuterol treatment and 125mg  solumedrol. Now patient is on 3L [place] saturation at 96%. 120 HR.  EMS vitals 144/66 CBG 157 HR 120  20G Left AC

## 2020-09-26 NOTE — ED Provider Notes (Signed)
450 case d/w cardiology Dr. Vickey Sages, can stay at [facility].  Will round on patient   Jonathan Kells, MD 09/26/20 [phone]

## 2020-09-26 NOTE — Consult Note (Signed)
Cardiology Consultation:   Patient ID: Jonathan [name] MRN: [id number]; DOB: 1960/11/05  Admit date: 09/26/2020 Date of Consult: 09/26/2020  PCP:  System, Provider Not In   [facility] Providers Cardiologist:  Minus Breeding, MD        Patient Profile:   Jonathan [name] is a 60 y.o. male with a hx of CAD with known CTO RCA, RA, ischemic cardiomyopathy, venous insufficiency, chronic wounds who is being seen 09/26/2020 for the evaluation of heart failure at the request of Dr. Neysa Bonito.  History of Present Illness:   Jonathan [name] is a 60 year old male with a hx of CAD with known CTO RCA, RA, ischemic cardiomyopathy, venous insufficiency, chronic wounds who is being seen 09/26/2020 for the evaluation of heart failure.  He reports worsening shortness of breath and chest pain, prompting him to call EMS last night.  On EMS arrival, was found to have SPO2 80% on room air and heart rate up to 140s.  He was put on 15 L nonrebreather, given albuterol and Solu-Medrol and taken to the ED.  In the ED, initial vital signs notable for pulse 115, BP 130/74, SPO2 94% on 3 L [place].  Labs notable for creatinine 0.68, potassium 4.1, sodium 137, albumin 2.9, BNP 489, troponin 107 > 144, WBC 8.3, hemoglobin 9.8, platelets 321.  CTPA showed no PE but did show diffuse airspace opacities consistent with pulmonary edema versus infection/inflammation.  He was admitted to [facility] and started on IV Lasix, vancomycin/Zosyn, and heparin drip.  Most recent echocardiogram 08/22/2019 showed LVEF 50 to 55% with mild inferior hypokinesis, grade 1 diastolic dysfunction, normal RV function, no significant valvular disease.  LHC on 08/21/2019 showed total occlusion of mid to distal RCA with faint left-to-right collaterals.  Manage medically.   Past Medical History:  Diagnosis Date  . Anemia    H/o using iron in the past   . Arthritis    rheumatoid  . Chronic back pain   . Hyperlipidemia   . Joint pain   . Joint swelling   . Rheumatoid  arthritis(714.0)     Past Surgical History:  Procedure Laterality Date  . COLONOSCOPY N/A 04/03/2013   Procedure: COLONOSCOPY;  Surgeon: Irene Shipper, MD;  Location: [facility];  Service: Endoscopy;  Laterality: N/A;  . COLONOSCOPY    . ENDOVENOUS ABLATION SAPHENOUS VEIN W/ LASER Left 09/26/2019   endovenous laser ablation left greater saphenous vein by Gae Gallop MD   . ENDOVENOUS ABLATION SAPHENOUS VEIN W/ LASER Right 10/31/2019   endovenous laser ablation right greater saphenous vein by Gae Gallop MD   . ESOPHAGOGASTRODUODENOSCOPY N/A 04/03/2013   Procedure: ESOPHAGOGASTRODUODENOSCOPY (EGD);  Surgeon: Irene Shipper, MD;  Location: [facility];  Service: Endoscopy;  Laterality: N/A;  changed to moderate dr. Henrene Pastor did not want to go to the o.r. for an endo colon-jmt  . EYE SURGERY Left    "when i was a very small boy"  . JOINT REPLACEMENT     both knees  . KNEE ARTHROPLASTY  06/07/2011   Procedure: COMPUTER ASSISTED TOTAL KNEE ARTHROPLASTY;  Surgeon: Mcarthur Rossetti, MD;  Location: [place];  Service: Orthopedics;  Laterality: Right;  Right total knee arthoplasty  . KNEE CLOSED REDUCTION  07/19/2011   Procedure: CLOSED MANIPULATION KNEE;  Surgeon: Mcarthur Rossetti, MD;  Location: [place];  Service: Orthopedics;  Laterality: Right;  Manipulation under anesthesia right knee  . LEFT HEART CATH AND CORONARY ANGIOGRAPHY N/A 08/21/2019   Procedure: LEFT HEART CATH AND CORONARY ANGIOGRAPHY;  Surgeon: Belva Crome, MD;  Location: [place] [facility];  Service: Cardiovascular;  Laterality: N/A;  . TOTAL HIP ARTHROPLASTY Right 11/11/2014   Procedure: RIGHT TOTAL HIP ARTHROPLASTY ANTERIOR APPROACH;  Surgeon: Mcarthur Rossetti, MD;  Location: [place];  Service: Orthopedics;  Laterality: Right;  . TOTAL HIP ARTHROPLASTY Left 01/29/2015   Procedure: LEFT TOTAL HIP ARTHROPLASTY ANTERIOR APPROACH;  Surgeon: Mcarthur Rossetti, MD;  Location: [place];  Service: Orthopedics;  Laterality:  Left;      Inpatient Medications: Scheduled Meds: . [START ON 09/27/2020] aspirin EC  81 mg Oral Daily  . atorvastatin  40 mg Oral Daily  . furosemide  40 mg Intravenous BID  . gabapentin  300 mg Oral BID  . isosorbide mononitrate  60 mg Oral Daily  . leflunomide  10 mg Oral Daily  . pantoprazole  40 mg Oral Daily  . spironolactone  25 mg Oral Daily   Continuous Infusions: . heparin 750 Units/hr (09/26/20 0433)  . magnesium sulfate bolus IVPB    . potassium chloride 10 mEq (09/26/20 0931)   PRN Meds: acetaminophen **OR** acetaminophen, hydrALAZINE, HYDROcodone-acetaminophen, morphine injection, ondansetron (ZOFRAN) IV, polyethylene glycol  Allergies:    Allergies  Allergen Reactions  . Infliximab Nausea Only and Shortness Of Breath    Social History:   Social History   Socioeconomic History  . Marital status: Single    Spouse name: Not on file  . Number of children: Not on file  . Years of education: Not on file  . Highest education level: Not on file  Occupational History  . Occupation: Disabled  Tobacco Use  . Smoking status: Former Smoker    Types: Cigarettes    Quit date: 04/25/2004    Years since quitting: 16.4  . Smokeless tobacco: Never Used  Vaping Use  . Vaping Use: Never used  Substance and Sexual Activity  . Alcohol use: No  . Drug use: No  . Sexual activity: Never  Other Topics Concern  . Not on file  Social History Narrative  . Not on file   Social Determinants of Health   Financial Resource Strain: Not on file  Food Insecurity: Not on file  Transportation Needs: Not on file  Physical Activity: Not on file  Stress: Not on file  Social Connections: Not on file  Intimate Partner Violence: Not on file    Family History:    Family History  Problem Relation Age of Onset  . Cancer Mother        ?  Marland Kitchen Hypertension Mother   . Heart failure Father        heart transplant  . Cancer Brother   . Anesthesia problems Neg Hx   . Colon cancer Neg  Hx        mother had "3" types of CA- unsure about which one  . Esophageal cancer Neg Hx   . Rectal cancer Neg Hx   . Stomach cancer Neg Hx      ROS:  Please see the history of present illness.  All other ROS reviewed and negative.     Physical Exam/Data:   Vitals:   09/26/20 0915 09/26/20 0930 09/26/20 0945 09/26/20 1000  BP: (!) 106/58 112/75 124/67 115/67  Pulse: 78 86 94 82  Resp: 15 17 16 20   Temp:      TempSrc:      SpO2: 97% 96%  97%  Weight:      Height:  Intake/Output Summary (Last 24 hours) at 09/26/2020 1027 Last data filed at 09/26/2020 0617 Gross per 24 hour  Intake 1041.67 ml  Output 2800 ml  Net -1758.33 ml   Last 3 Weights 09/26/2020 09/07/2020 11/28/2019  Weight (lbs) 140 lb 140 lb 145 lb  Weight (kg) 63.504 kg 63.504 kg 65.772 kg     Body mass index is 24.8 kg/m.  General: Chronically ill-appearing, in no acute distress HEENT: normal Neck: no JVD appreciated Cardiac:  normal S1, S2; RRR; no murmur  Lungs: Bibasilar crackles Abd: soft, nontender Ext: no edema Musculoskeletal:  No deformities Skin: warm and dry  Neuro:  no focal abnormalities noted Psych:  Normal affect   EKG:  The EKG was personally reviewed and demonstrates: Sinus tachycardia, rate 115, LVH, PVCs, left atrial enlargement, Q waves in inferior leads Telemetry:  Telemetry was personally reviewed and demonstrates: Normal sinus rhythm, rate 80s to 100s  Relevant CV Studies:  Laboratory Data:  High Sensitivity Troponin:   Recent Labs  Lab 09/07/20 0644 09/07/20 0844 09/26/20 0133 09/26/20 0405  TROPONINIHS 20* 23* 107* 144*     Chemistry Recent Labs  Lab 09/26/20 0133 09/26/20 0216  NA 137 139  K 4.1 3.2*  CL 105 104  CO2 23  --   GLUCOSE 124* 131*  BUN 8 7  CREATININE 0.68 0.50*  CALCIUM 8.8*  --   GFRNONAA >60  --   ANIONGAP 9  --     Recent Labs  Lab 09/26/20 0133  PROT 6.9  ALBUMIN 2.9*  AST 34  ALT 12  ALKPHOS 75  BILITOT 1.3*    Hematology Recent Labs  Lab 09/26/20 0133 09/26/20 0216  WBC 8.3  --   RBC 4.02*  --   HGB 9.8* 9.9*  HCT 31.5* 29.0*  MCV 78.4*  --   MCH 24.4*  --   MCHC 31.1  --   RDW 15.4  --   PLT 321  --    BNP Recent Labs  Lab 09/26/20 0133  BNP 488.9*    DDimer No results for input(s): DDIMER in the last 168 hours.   Radiology/Studies:  CT Angio Chest PE W and/or Wo Contrast  Result Date: 09/26/2020 CLINICAL DATA:  Chest pain.  Effusion suspected. EXAM: CT ANGIOGRAPHY CHEST WITH CONTRAST TECHNIQUE: Multidetector CT imaging of the chest was performed using the standard protocol during bolus administration of intravenous contrast. Multiplanar CT image reconstructions and MIPs were obtained to evaluate the vascular anatomy. CONTRAST:  59mL OMNIPAQUE IOHEXOL 350 MG/ML SOLN COMPARISON:  None. FINDINGS: Cardiovascular: Satisfactory opacification of the pulmonary arteries to the proximal segmental level. No evidence of pulmonary embolism. Limited evaluation at the subsegmental level due to respiratory motion artifact. Normal heart size. No pericardial effusion. The thoracic aorta is normal in caliber. Mild atherosclerotic plaque. Mediastinum/Nodes: Bilateral axillary lymphadenopathy. No enlarged mediastinal or hilar lymph nodes. Thyroid gland, trachea, and esophagus demonstrate no significant findings. Lungs/Pleura: Slightly more consolidative airspace opacities at the lung bases. Diffuse, lower lobe predominant, peribronchovascular airspace opacities. Bilateral trace pleural effusions. No pneumothorax. Upper Abdomen: No acute abnormality. Musculoskeletal: No chest wall abnormality. Severe chronic degenerative changes of bilateral shoulders with cortical erosion and destruction of the femoral heads and acetabuli. Question underlying joint effusions. Review of the MIP images confirms the above findings. IMPRESSION: 1. No central or proximal segmental pulmonary embolus with limited evaluation more  distally due to motion artifact. 2. Diffuse, lower lobe predominant, peribronchovascular airspace opacities. Bilateral trace pleural effusions. Findings could represent  pulmonary edema versus infection/inflammation. 3. Severe chronic degenerative changes of bilateral shoulders with cortical erosion and destruction of the femoral heads and acetabuli. Question underlying joint effusions. 4. Bilateral axillary lymphadenopathy. Electronically Signed   By: Iven Finn M.D.   On: 09/26/2020 03:48   DG Chest Portable 1 View  Result Date: 09/26/2020 CLINICAL DATA:  Shortness of breath EXAM: PORTABLE CHEST 1 VIEW COMPARISON:  09/07/2020 FINDINGS: Moderate cardiomegaly. Bilateral basilar predominant interstitial opacities. No pleural effusion or pneumothorax. IMPRESSION: Cardiomegaly and bilateral basilar predominant interstitial opacities, likely pulmonary edema. Electronically Signed   By: Ulyses Jarred M.D.   On: 09/26/2020 03:13     Assessment and Plan:   Acute heart failure: Presents with acute hypoxic respiratory failure, BNP 49 and pulmonary edema on CTPA.  Most recent echocardiogram in 07/2019 showed LVEF 50 to 55%.  Has known CAD with CTO RCA on cath 07/2019 -Check echocardiogram.  If LV systolic function has declined, will plan further ischemic evaluation -Continue IV Lasix 40 mg twice daily -Will follow-up results of echocardiogram and start GDMT if systolic function reduced  Troponin elevation: Mild troponin elevation, 107 > 144.  EKG with inferior Q waves.  Suspect demand ischemia in setting of decompensated heart failure as above.  Does have known CAD with CTO RCA. -Follow-up results of echocardiogram.  If systolic function reduced, will plan further ischemic evaluation -Check another troponin, if remains low level troponin elevation suspect demand ischemia and can discontinue heparin gtt -Continue aspirin, statin  Hypertension: Currently normotensive, can continue home spironolactone and  Imdur.  Hold home lisinopril for now, if systolic function reduced we will plan to start Entresto.   for questions or updates, please contact [place] Please consult [email or web address] for contact info under    Signed, Donato Heinz, MD  09/26/2020 10:27 AM

## 2020-09-26 NOTE — ED Notes (Signed)
Oxygen removed- 94% RA

## 2020-09-26 NOTE — ED Notes (Signed)
Patient is ready for admission to room 414

## 2020-09-26 NOTE — Progress Notes (Signed)
  Echocardiogram 2D Echocardiogram has been performed.  Merrie Roof F 09/26/2020, 12:40 PM

## 2020-09-27 ENCOUNTER — Encounter (HOSPITAL_COMMUNITY): Payer: Self-pay | Admitting: Family Medicine

## 2020-09-27 DIAGNOSIS — I214 Non-ST elevation (NSTEMI) myocardial infarction: Secondary | ICD-10-CM

## 2020-09-27 DIAGNOSIS — I10 Essential (primary) hypertension: Secondary | ICD-10-CM

## 2020-09-27 DIAGNOSIS — I5043 Acute on chronic combined systolic (congestive) and diastolic (congestive) heart failure: Secondary | ICD-10-CM

## 2020-09-27 LAB — BASIC METABOLIC PANEL
Anion gap: 6 (ref 5–15)
BUN: 16 mg/dL (ref 6–20)
CO2: 28 mmol/L (ref 22–32)
Calcium: 9.4 mg/dL (ref 8.9–10.3)
Chloride: 103 mmol/L (ref 98–111)
Creatinine, Ser: 0.6 mg/dL — ABNORMAL LOW (ref 0.61–1.24)
GFR, Estimated: >60 mL/min (ref >60)
Glucose, Bld: 157 mg/dL — ABNORMAL HIGH (ref 70–99)
Potassium: 3.5 mmol/L (ref 3.5–5.1)
Sodium: 137 mmol/L (ref 135–145)

## 2020-09-27 LAB — LIPID PANEL
Cholesterol: 202 mg/dL — ABNORMAL HIGH (ref 0–200)
HDL: 56 mg/dL (ref >40)
LDL Cholesterol: 128 mg/dL — ABNORMAL HIGH (ref 0–99)
Total CHOL/HDL Ratio: 3.6 RATIO
Triglycerides: 88 mg/dL (ref <150)
VLDL: 18 mg/dL (ref 0–40)

## 2020-09-27 LAB — CBC
HCT: 32.2 % — ABNORMAL LOW (ref 39.0–52.0)
Hemoglobin: 10 g/dL — ABNORMAL LOW (ref 13.0–17.0)
MCH: 24.2 pg — ABNORMAL LOW (ref 26.0–34.0)
MCHC: 31.1 g/dL (ref 30.0–36.0)
MCV: 78 fL — ABNORMAL LOW (ref 80.0–100.0)
Platelets: 346 10*3/uL (ref 150–400)
RBC: 4.13 MIL/uL — ABNORMAL LOW (ref 4.22–5.81)
RDW: 15.5 % (ref 11.5–15.5)
WBC: 10.5 10*3/uL (ref 4.0–10.5)
nRBC: 0 % (ref 0.0–0.2)

## 2020-09-27 LAB — HEPARIN LEVEL (UNFRACTIONATED)
Heparin Unfractionated: 0.16 IU/mL — ABNORMAL LOW (ref 0.30–0.70)
Heparin Unfractionated: 0.41 IU/mL (ref 0.30–0.70)
Heparin Unfractionated: 0.59 IU/mL (ref 0.30–0.70)

## 2020-09-27 LAB — MAGNESIUM: Magnesium: 2 mg/dL (ref 1.7–2.4)

## 2020-09-27 LAB — PROCALCITONIN: Procalcitonin: <0.1 ng/mL

## 2020-09-27 MED ORDER — SODIUM CHLORIDE 0.9% FLUSH
3.0000 mL | Freq: Two times a day (BID) | INTRAVENOUS | Status: DC
  Administered 2020-09-27: 3 mL via INTRAVENOUS

## 2020-09-27 MED ORDER — HEPARIN BOLUS VIA INFUSION
2000.0000 [IU] | Freq: Once | INTRAVENOUS | Status: AC
  Administered 2020-09-27: 2000 [IU] via INTRAVENOUS
  Filled 2020-09-27: qty 2000

## 2020-09-27 MED ORDER — SODIUM CHLORIDE 0.9 % IV SOLN: 250.0000 mL | INTRAVENOUS | Status: DC | PRN

## 2020-09-27 MED ORDER — FUROSEMIDE 40 MG PO TABS
40.0000 mg | ORAL_TABLET | Freq: Every day | ORAL | Status: DC
  Administered 2020-09-28 – 2020-09-29 (×2): 40 mg via ORAL
  Filled 2020-09-27 (×2): qty 1

## 2020-09-27 MED ORDER — SODIUM CHLORIDE 0.9 % IV SOLN: INTRAVENOUS | Status: DC

## 2020-09-27 MED ORDER — SODIUM CHLORIDE 0.9% FLUSH: 3.0000 mL | INTRAVENOUS | Status: DC | PRN

## 2020-09-27 MED ORDER — POTASSIUM CHLORIDE CRYS ER 20 MEQ PO TBCR
40.0000 meq | EXTENDED_RELEASE_TABLET | Freq: Once | ORAL | Status: AC
  Administered 2020-09-27: 40 meq via ORAL
  Filled 2020-09-27: qty 2

## 2020-09-27 MED ORDER — ATORVASTATIN CALCIUM 40 MG PO TABS
80.0000 mg | ORAL_TABLET | Freq: Every day | ORAL | Status: DC
  Administered 2020-09-28 – 2020-09-29 (×2): 80 mg via ORAL
  Filled 2020-09-27 (×2): qty 2

## 2020-09-27 NOTE — Progress Notes (Addendum)
[facility] for Heparin Indication: chest pain/ACS  Allergies  Allergen Reactions  . Infliximab Nausea Only and Shortness Of Breath    Patient Measurements: Height: 5\' 3"  (160 cm) Weight: 58.5 kg (128 lb 15.5 oz) IBW/kg (Calculated) : 56.9 Heparin Dosing Weight: 63.5 kg   Vital Signs: Temp: 97.7 F (36.5 C) (06/05 0439) Temp Source: Oral (06/05 0439) BP: 96/53 (06/05 0439) Pulse Rate: 80 (06/05 0439)  Labs: Recent Labs    09/26/20 0133 09/26/20 0216 09/26/20 0405 09/26/20 1030 09/26/20 1315 09/26/20 1827 09/27/20 0138 09/27/20 0853  HGB 9.8* 9.9*  --   --   --   --  10.0*  --   HCT 31.5* 29.0*  --   --   --   --  32.2*  --   PLT 321  --   --   --   --   --  346  --   HEPARINUNFRC  --   --   --    < >  --  0.10* 0.16* 0.41  CREATININE 0.68 0.50*  --   --   --   --  0.60*  --   TROPONINIHS 107*  --  144*  --  84*  --   --   --    < > = values in this interval not displayed.    Estimated Creatinine Clearance: 80 mL/min (A) (by C-G formula based on SCr of 0.6 mg/dL (L)).   Medical History: Past Medical History:  Diagnosis Date  . Anemia    H/o using iron in the past   . Arthritis    rheumatoid  . Chronic back pain   . Hyperlipidemia   . Joint pain   . Joint swelling   . Rheumatoid arthritis(714.0)     Medications:  Infusions:  . heparin 1,350 Units/hr (09/27/20 8546)    Assessment: 60 yo M who presented with shortness of breath. Chest CT negative for PE, Troponin elevated, CXR/EKG abnormal. Not on anticoagulation PTA. CBC- Hg low at 9.9, pltc WNL. Renal function at baseline.   Today, 09/27/20:  Heparin level therapeutic at 0.41 with heparin infusing at 1350 units/hour  CBC- Hg low/stable at 10; pltc WNL  No infusion interruptions/issues and no bleeding per nurse  Goal of Therapy:  Heparin level 0.3-0.7 units/ml Monitor platelets by anticoagulation protocol: Yes   Plan:  Continue heparin infusion at 1350  units/hr Confirmatory heparin level in 6hrs Monitor daily heparin level, CBC, s/s bleeding  Azyriah Nevins P. Legrand Como, PharmD, [place] Please utilize Amion for appropriate phone number to reach the unit pharmacist ([place]) 09/27/2020 11:28 AM

## 2020-09-27 NOTE — Progress Notes (Addendum)
Progress Note  Patient Name: Jonathan [name] Date of Encounter: 09/27/2020  [facility] Cardiologist: Minus Breeding, MD   Subjective   BP 96/53 this morning.  Recorded as net +124 cc yesterday on IV Lasix 40 mg x 2, but weight is recorded as being down 11 pounds.  Stable renal function (creatinine 0.5 > 0.6).  Reports continues to have intermittent chest pain.  Dyspnea improved.  Inpatient Medications    Scheduled Meds: . aspirin EC  81 mg Oral Daily  . atorvastatin  40 mg Oral Daily  . furosemide  40 mg Intravenous BID  . gabapentin  300 mg Oral BID  . isosorbide mononitrate  60 mg Oral Daily  . leflunomide  10 mg Oral Daily  . pantoprazole  40 mg Oral Daily  . spironolactone  25 mg Oral Daily   Continuous Infusions: . heparin 1,350 Units/hr (09/27/20 0929)   PRN Meds: acetaminophen **OR** acetaminophen, hydrALAZINE, HYDROcodone-acetaminophen, morphine injection, ondansetron (ZOFRAN) IV, polyethylene glycol   Vital Signs    Vitals:   09/26/20 2300 09/27/20 0200 09/27/20 0434 09/27/20 0439  BP: 102/61 (!) 95/57  (!) 96/53  Pulse:  79  80  Resp:    18  Temp:  97.6 F (36.4 C)  97.7 F (36.5 C)  TempSrc:    Oral  SpO2:  100%  97%  Weight:   58.5 kg   Height:        Intake/Output Summary (Last 24 hours) at 09/27/2020 0959 Last data filed at 09/27/2020 0929 Gross per 24 hour  Intake 1334.2 ml  Output 850 ml  Net 484.2 ml   Last 3 Weights 09/27/2020 09/26/2020 09/26/2020  Weight (lbs) 128 lb 15.5 oz 140 lb 140 lb  Weight (kg) 58.5 kg 63.504 kg 63.504 kg      Telemetry    NSR 70-120s, NSVT x9 beats - Personally Reviewed  ECG    No new ECG - Personally Reviewed  Physical Exam   GEN: No acute distress.   Neck: No JVD Cardiac: RRR, no murmurs, rubs, or gallops.  Respiratory: Clear to auscultation bilaterally. GI: Soft, nontender, non-distended  MS: No edema; No deformity. Neuro:  Nonfocal  Psych: Normal affect   Labs    High Sensitivity Troponin:    Recent Labs  Lab 09/07/20 0644 09/07/20 0844 09/26/20 0133 09/26/20 0405 09/26/20 1315  TROPONINIHS 20* 23* 107* 144* 84*      Chemistry Recent Labs  Lab 09/26/20 0133 09/26/20 0216 09/27/20 0138  NA 137 139 137  K 4.1 3.2* 3.5  CL 105 104 103  CO2 23  --  28  GLUCOSE 124* 131* 157*  BUN 8 7 16   CREATININE 0.68 0.50* 0.60*  CALCIUM 8.8*  --  9.4  PROT 6.9  --   --   ALBUMIN 2.9*  --   --   AST 34  --   --   ALT 12  --   --   ALKPHOS 75  --   --   BILITOT 1.3*  --   --   GFRNONAA >60  --  >60  ANIONGAP 9  --  6     Hematology Recent Labs  Lab 09/26/20 0133 09/26/20 0216 09/27/20 0138  WBC 8.3  --  10.5  RBC 4.02*  --  4.13*  HGB 9.8* 9.9* 10.0*  HCT 31.5* 29.0* 32.2*  MCV 78.4*  --  78.0*  MCH 24.4*  --  24.2*  MCHC 31.1  --  31.1  RDW  15.4  --  15.5  PLT 321  --  346    BNP Recent Labs  Lab 09/26/20 0133  BNP 488.9*     DDimer No results for input(s): DDIMER in the last 168 hours.   Radiology    CT Angio Chest PE W and/or Wo Contrast  Result Date: 09/26/2020 CLINICAL DATA:  Chest pain.  Effusion suspected. EXAM: CT ANGIOGRAPHY CHEST WITH CONTRAST TECHNIQUE: Multidetector CT imaging of the chest was performed using the standard protocol during bolus administration of intravenous contrast. Multiplanar CT image reconstructions and MIPs were obtained to evaluate the vascular anatomy. CONTRAST:  94mL OMNIPAQUE IOHEXOL 350 MG/ML SOLN COMPARISON:  None. FINDINGS: Cardiovascular: Satisfactory opacification of the pulmonary arteries to the proximal segmental level. No evidence of pulmonary embolism. Limited evaluation at the subsegmental level due to respiratory motion artifact. Normal heart size. No pericardial effusion. The thoracic aorta is normal in caliber. Mild atherosclerotic plaque. Mediastinum/Nodes: Bilateral axillary lymphadenopathy. No enlarged mediastinal or hilar lymph nodes. Thyroid gland, trachea, and esophagus demonstrate no significant  findings. Lungs/Pleura: Slightly more consolidative airspace opacities at the lung bases. Diffuse, lower lobe predominant, peribronchovascular airspace opacities. Bilateral trace pleural effusions. No pneumothorax. Upper Abdomen: No acute abnormality. Musculoskeletal: No chest wall abnormality. Severe chronic degenerative changes of bilateral shoulders with cortical erosion and destruction of the femoral heads and acetabuli. Question underlying joint effusions. Review of the MIP images confirms the above findings. IMPRESSION: 1. No central or proximal segmental pulmonary embolus with limited evaluation more distally due to motion artifact. 2. Diffuse, lower lobe predominant, peribronchovascular airspace opacities. Bilateral trace pleural effusions. Findings could represent pulmonary edema versus infection/inflammation. 3. Severe chronic degenerative changes of bilateral shoulders with cortical erosion and destruction of the femoral heads and acetabuli. Question underlying joint effusions. 4. Bilateral axillary lymphadenopathy. Electronically Signed   By: Iven Finn M.D.   On: 09/26/2020 03:48   DG Chest Portable 1 View  Result Date: 09/26/2020 CLINICAL DATA:  Shortness of breath EXAM: PORTABLE CHEST 1 VIEW COMPARISON:  09/07/2020 FINDINGS: Moderate cardiomegaly. Bilateral basilar predominant interstitial opacities. No pleural effusion or pneumothorax. IMPRESSION: Cardiomegaly and bilateral basilar predominant interstitial opacities, likely pulmonary edema. Electronically Signed   By: Ulyses Jarred M.D.   On: 09/26/2020 03:13   ECHOCARDIOGRAM COMPLETE  Result Date: 09/26/2020    ECHOCARDIOGRAM REPORT   [name] Date of Exam: 09/26/2020 Medical Rec #:  [id number]        Height:       63.0 in Accession #:    [id number]       Weight:       140.0 lb Date of Birth:  [birth year]       BSA:          1.662 m Patient Age:    60 years         BP:           109/60 mmHg Patient Gender: M                 HR:           93 bpm. Exam Location:  Inpatient Procedure: 2D Echo, Cardiac Doppler and Color Doppler Indications:    CHF I50.9  History:        Patient has prior history of Echocardiogram examinations, most                 recent 08/22/2019. Previous Myocardial Infarction and CAD.  Sonographer:    Apolonio Schneiders  [place] Referring Phys: [id number] [place]  1. Left ventricular ejection fraction, by estimation, is 30 to 35%. The left ventricle has moderately decreased function. The left ventricle demonstrates regional wall motion abnormalities. Inferior/inferoseptal akinesis. The left ventricular internal cavity size was mildly dilated. Left ventricular diastolic parameters are consistent with Grade II diastolic dysfunction (pseudonormalization). Elevated left atrial pressure.  2. Right ventricular systolic function is normal. The right ventricular size is normal. Tricuspid regurgitation signal is inadequate for assessing PA pressure.  3. Left atrial size was mildly dilated.  4. The aortic valve was not well visualized. Aortic valve regurgitation is mild to moderate. Eccentric posterior directed AI jet, visually appears mild but may be underestimating given eccentric jet, moderate by PHT. No aortic stenosis is present.  5. The inferior vena cava is normal in size with greater than 50% respiratory variability, suggesting right atrial pressure of 3 mmHg.  6. The mitral valve is normal in structure. No evidence of mitral valve regurgitation. FINDINGS  Left Ventricle: Left ventricular ejection fraction, by estimation, is 30 to 35%. The left ventricle has moderately decreased function. The left ventricle demonstrates regional wall motion abnormalities. The left ventricular internal cavity size was mildly dilated. There is no left ventricular hypertrophy. Left ventricular diastolic parameters are consistent with Grade II diastolic dysfunction (pseudonormalization). Elevated left atrial pressure. Right  Ventricle: The right ventricular size is normal. No increase in right ventricular wall thickness. Right ventricular systolic function is normal. Tricuspid regurgitation signal is inadequate for assessing PA pressure. Left Atrium: Left atrial size was mildly dilated. Right Atrium: Right atrial size was normal in size. Pericardium: Trivial pericardial effusion is present. Mitral Valve: The mitral valve is normal in structure. No evidence of mitral valve regurgitation. Tricuspid Valve: The tricuspid valve is normal in structure. Tricuspid valve regurgitation is trivial. Aortic Valve: The aortic valve was not well visualized. Aortic valve regurgitation is mild to moderate. No aortic stenosis is present. Pulmonic Valve: The pulmonic valve was not well visualized. Pulmonic valve regurgitation is not visualized. Aorta: The aortic root is normal in size and structure. Venous: The inferior vena cava is normal in size with greater than 50% respiratory variability, suggesting right atrial pressure of 3 mmHg. IAS/Shunts: The interatrial septum was not well visualized.  LEFT VENTRICLE PLAX 2D LVIDd:         5.90 cm      Diastology LVIDs:         4.80 cm      LV e' medial:    5.33 cm/s LV PW:         0.80 cm      LV E/e' medial:  16.3 LV IVS:        0.60 cm      LV e' lateral:   5.77 cm/s LVOT diam:     2.10 cm      LV E/e' lateral: 15.1 LV SV:         58 LV SV Index:   35 LVOT Area:     3.46 cm  LV Volumes (MOD) LV vol d, MOD A2C: 197.0 ml LV vol d, MOD A4C: 205.0 ml LV vol s, MOD A2C: 147.0 ml LV vol s, MOD A4C: 120.5 ml LV SV MOD A2C:     50.0 ml LV SV MOD A4C:     205.0 ml LV SV MOD BP:      67.1 ml RIGHT VENTRICLE RV Basal diam:  3.70 cm LEFT ATRIUM  Index       RIGHT ATRIUM           Index LA diam:        3.70 cm 2.23 cm/m  RA Area:     18.10 cm LA Vol (A2C):   77.3 ml 46.52 ml/m RA Volume:   46.90 ml  28.22 ml/m LA Vol (A4C):   46.6 ml 28.04 ml/m LA Biplane Vol: 62.2 ml 37.43 ml/m  AORTIC VALVE LVOT Vmax:    88.80 cm/s LVOT Vmean:  56.500 cm/s LVOT VTI:    0.168 m  AORTA Ao Root diam: 3.10 cm MITRAL VALVE MV Area (PHT): 6.12 cm    SHUNTS MV Decel Time: 124 msec    Systemic VTI:  0.17 m MV E velocity: 87.00 cm/s  Systemic Diam: 2.10 cm MV A velocity: 94.70 cm/s MV E/A ratio:  0.92 Oswaldo Milian MD Electronically signed by Oswaldo Milian MD Signature Date/Time: 09/26/2020/2:24:59 PM    Final     Cardiac Studies   Echo 09/26/20: 1. Left ventricular ejection fraction, by estimation, is 30 to 35%. The  left ventricle has moderately decreased function. The left ventricle  demonstrates regional wall motion abnormalities. Inferior/inferoseptal  akinesis. The left ventricular internal  cavity size was mildly dilated. Left ventricular diastolic parameters are  consistent with Grade II diastolic dysfunction (pseudonormalization).  Elevated left atrial pressure.  2. Right ventricular systolic function is normal. The right ventricular  size is normal. Tricuspid regurgitation signal is inadequate for assessing  PA pressure.  3. Left atrial size was mildly dilated.  4. The aortic valve was not well visualized. Aortic valve regurgitation  is mild to moderate. Eccentric posterior directed AI jet, visually appears  mild but may be underestimating given eccentric jet, moderate by PHT. No  aortic stenosis is present.  5. The inferior vena cava is normal in size with greater than 50%  respiratory variability, suggesting right atrial pressure of 3 mmHg.  6. The mitral valve is normal in structure. No evidence of mitral valve  regurgitation.   Patient Profile     60 y.o. male with a hx of CAD with known CTO RCA, RA, ischemic cardiomyopathy, venous insufficiency, chronic wounds who is being seen 09/26/2020 for the evaluation of heart failure   Assessment & Plan    Acute on chronic combined systolic and diastolic heart failure: Presents with acute hypoxic respiratory failure, BNP 489 and pulmonary  edema on CTPA.  Echocardiogram in 07/2019 showed LVEF 50 to 55%.  Has known CAD with CTO RCA on cath 07/2019.  Echo on 6/4 shows EF 30 to 35%, inferior/inferoseptal akinesis, mild to moderate AI. -Soft BP, 96/53 this morning.  Hold off on ACE/ARB/ARNI at this time -He appears euvolemic on exam and IVC small/collapsible on echo.  Received IV Lasix 40 mg this morning, will switch to p.o. Lasix tomorrow -Given worsening systolic function and continues to have intermittent chest pain with mild troponin elevation, recommend LHC/RHC tomorrow for further evaluation.  Risks and benefits of cardiac catheterization have been discussed with the patient.  These include bleeding, infection, kidney damage, stroke, heart attack, death.  The patient understands these risks and is willing to proceed.  Troponin elevation: Mild troponin elevation, 107 > 144 >84.  Presented with chest pain. EKG with inferior Q waves.  Suspect demand ischemia in setting of decompensated heart failure as above, though continues to report intermittent chest pain and EF is down compared to prior.  Does have known CAD with CTO RCA. -  Plan cath as above -Continue heparin gtt x 48 hours -Continue aspirin, statin.  LDL 128, will increase atorvastatin to 80 mg daily  Hypertension: Currently normotensive, can continue home spironolactone and Imdur.  Hold home lisinopril for now.  Has had soft BP this morning, can plan to transition to [facility] if BP tolerates as above  NSVT: Maintain K greater than 4, mag greater than 2   For questions or updates, please contact [facility] Please consult [email or web address] for contact info under        Signed, Donato Heinz, MD  09/27/2020, 9:59 AM

## 2020-09-27 NOTE — Consult Note (Signed)
[facility] Nurse Consult Note: Patient receiving care in [place] Most recent visit with Podiatry at [facility] on 09/22/20. Will continue care as started with Dr. Delora Fuel Reason for Consult: Foot wound Wound type: Venous insufficiency and venous ulceration of the dorsal left foot. (per note from Dr. Delora Fuel at [facility]) Pressure Injury POA: NA Measurement: See flowsheet Wound bed: Red/pink full thickness wound Drainage (amount, consistency, odor)  Periwound: Intact Dressing procedure/placement/frequency: Place a small piece of Aquacel Ag+ Advantage in the wound and secure with small foam dressing. Biweekly una boot to be applied on Monday and Thurs. Patient should continue with biweekly una boot change with HH and FU with Dr. Delora Fuel in 4 weeks as planned.   Monitor the wound area(s) for worsening of condition such as: Signs/symptoms of infection, increase in size, development of or worsening of odor, development of pain, or increased pain at the affected locations.   Notify the medical team if any of these develop.  Thank you for the consult. [facility] nurse will not follow at this time.   Please re-consult the [place] team if needed.  Cathlean Marseilles Tamala Julian, MSN, RN, [place], Lysle Pearl, [facility] Wound Treatment Associate Pager [phone]

## 2020-09-27 NOTE — Progress Notes (Signed)
[place] for Heparin Indication: chest pain/ACS  Allergies  Allergen Reactions  . Infliximab Nausea Only and Shortness Of Breath    Patient Measurements: Height: 5\' 3"  (160 cm) Weight: 63.5 kg (140 lb) IBW/kg (Calculated) : 56.9 Heparin Dosing Weight: 63.5 kg   Vital Signs: Temp: 97.6 F (36.4 C) (06/05 0200) Temp Source: Oral (06/04 1813) BP: 95/57 (06/05 0200) Pulse Rate: 79 (06/05 0200)  Labs: Recent Labs    09/26/20 0133 09/26/20 0216 09/26/20 0405 09/26/20 1030 09/26/20 1315 09/26/20 1827 09/27/20 0138  HGB 9.8* 9.9*  --   --   --   --  10.0*  HCT 31.5* 29.0*  --   --   --   --  32.2*  PLT 321  --   --   --   --   --  346  HEPARINUNFRC  --   --   --  <0.10*  --  0.10* 0.16*  CREATININE 0.68 0.50*  --   --   --   --  0.60*  TROPONINIHS 107*  --  144*  --  84*  --   --     Estimated Creatinine Clearance: 80 mL/min (A) (by C-G formula based on SCr of 0.6 mg/dL (L)).   Medical History: Past Medical History:  Diagnosis Date  . Anemia    H/o using iron in the past   . Arthritis    rheumatoid  . Chronic back pain   . Hyperlipidemia   . Joint pain   . Joint swelling   . Rheumatoid arthritis(714.0)     Medications:  Infusions:  . heparin      Assessment: 60 yo M who presented with shortness of breath. Chest CT negative for PE, Troponin elevated, CXR/EKG abnormal. Not on anticoagulation PTA. CBC- Hg low at 9.9, pltc WNL. Renal function at baseline.   Today, 09/26/20:  Heparin level SUB-therapeutic at 0.16 with heparin infusing at 1150 units/hour  CBC- Hg low/stable at 10; pltc WNL  No infusion interruptions/issues and no bleeding per nurse  Goal of Therapy:  Heparin level 0.3-0.7 units/ml Monitor platelets by anticoagulation protocol: Yes   Plan:  Heparin 2000 units IV bolus then increase heparin infusion to 1350 units/hr Recheck heparin level in 6hrs Monitor daily heparin level, CBC, s/s  bleeding  Netta Cedars, PharmD, BCPS 09/27/2020 2:11 AM

## 2020-09-27 NOTE — Progress Notes (Signed)
Progress Note    Jonathan [name]  YDX:[id number] DOB: 10-Oct-1960  DOA: 09/26/2020 PCP: System, Provider Not In      Brief Narrative:    Medical records reviewed and are as summarized below:  Jonathan [name] is a 60 y.o. male with past medical history of RA, CAD, s/p bilateral great saphenous vein laser ablations, chronic wounds, ischemic cardiomyopathy, neuropathy secondary to RA, HFrEF, chronic pain, who presented to the hospital with chest pain and shortness of breath.  Reportedly, when EMS arrived, his heart rate was in the 140s and oxygen saturation was 80% on room air.  He was admitted to the hospital for acute hypoxic respiratory failure, chest pain and acute on chronic CHF.  He was treated with IV Lasix and oxygen.    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    Acute on chronic combined systolic and diastolic CHF: IV Lasix has been transitioned to oral Lasix.  Continue Aldactone.  2D echo on 09/27/2018 showed EF estimated at 30 to 72%, grade 2 diastolic dysfunction  Mild chest pain, mildly elevated troponins with underlying CAD: Probably from demand ischemia.  Cardiologist recommended continuing heparin infusion.  Monitor heparin level per protocol.  Plan for left heart cath tomorrow.  Continue aspirin, Lipitor and isosorbide mononitrate.  Rheumatoid arthritis: Continue leflunomide   Diet Order            Diet NPO time specified  Diet effective midnight           Diet Heart Room service appropriate? Yes; Fluid consistency: Thin; Fluid restriction: 1200 mL Fluid  Diet effective now                     Consultants:  Cardiologist  Procedures:  None    Medications:   . aspirin EC  81 mg Oral Daily  . [START ON 09/28/2020] atorvastatin  80 mg Oral Daily  . [START ON 09/28/2020] furosemide  40 mg Oral Daily  . gabapentin  300 mg Oral BID  . isosorbide mononitrate  60 mg Oral Daily  . leflunomide  10 mg Oral Daily  . pantoprazole  40 mg Oral Daily  . spironolactone  25 mg Oral Daily   Continuous Infusions: . heparin 1,350 Units/hr (09/27/20 1344)     Anti-infectives (From admission, onward)   Start     Dose/Rate Route Frequency Ordered Stop   09/26/20 0330  vancomycin (VANCOCIN) IVPB 1000 mg/200 mL premix        1,000 mg 200 mL/hr over 60 Minutes Intravenous  Once 09/26/20 0325 09/26/20 0504   09/26/20 0330  piperacillin-tazobactam (ZOSYN) IVPB 3.375 g        3.375 g 100 mL/hr over 30 Minutes Intravenous  Once 09/26/20 0325 09/26/20 0429             Family Communication/Anticipated D/C date and plan/Code Status   DVT prophylaxis:      Code Status: Full Code  Family Communication: None Disposition Plan:    Status is: Inpatient  Remains inpatient appropriate because:Inpatient level of care appropriate due to severity of illness   Dispo: The patient is from:  Home              Anticipated d/c is to: Home              Patient currently is not medically stable to d/c.   Difficult to place patient No           Subjective:   C/o mild intermittent chest pain which he said he has had for years since he had his MI.  Objective:    Vitals:   09/26/20 2300 09/27/20 0200 09/27/20 0434 09/27/20 0439  BP: 102/61 (!) 95/57  (!) 96/53  Pulse:  79  80  Resp:    18  Temp:  97.6 F (36.4 C)  97.7 F (36.5 C)  TempSrc:    Oral  SpO2:  100%  97%  Weight:   58.5 kg   Height:       No data found.   Intake/Output Summary (Last 24 hours) at 09/27/2020 1435 Last data filed at 09/27/2020 1344 Gross per 24 hour  Intake 1605.75 ml  Output 1700 ml   Net -94.25 ml   Filed Weights   09/26/20 0128 09/26/20 1412 09/27/20 0434  Weight: 63.5 kg 63.5 kg 58.5 kg    Exam:  GEN: NAD SKIN: Superficial wound inferior to the right medial malleolus.  Wound on the dorsal aspects of the left foot proximal to the second through fourth toes.  Hyperpigmented b/l legs and feet. EYES: EOMI ENT: MMM CV: RRR PULM: CTA B ABD: soft, ND, NT, +BS CNS: AAO x 3, non focal EXT: No edema or tenderness        Data Reviewed:   I have personally reviewed following labs and imaging studies:  Labs: Labs show the following:   Basic Metabolic Panel: Recent Labs  Lab 09/26/20 0133 09/26/20 0216 09/26/20 0405 09/27/20 0138 09/27/20 0853  NA 137 139  --  137  --   K 4.1 3.2*  --  3.5  --   CL 105 104  --  103  --   CO2 23  --   --  28  --   GLUCOSE 124* 131*  --  157*  --   BUN 8 7  --  16  --   CREATININE 0.68 0.50*  --  0.60*  --   CALCIUM 8.8*  --   --  9.4  --   MG  --   --  1.7  --  2.0   GFR Estimated Creatinine Clearance: 80 mL/min (A) (by C-G formula based on SCr of 0.6 mg/dL (L)). Liver Function Tests: Recent Labs  Lab 09/26/20 0133  AST 34  ALT 12  ALKPHOS 75  BILITOT 1.3*  PROT 6.9  ALBUMIN 2.9*   No results for input(s): LIPASE, AMYLASE in the last 168 hours. No results for input(s): AMMONIA in the last 168 hours. Coagulation profile No results for input(s): INR, PROTIME in the last 168 hours.  CBC: Recent Labs  Lab 09/26/20 0133 09/26/20 0216 09/27/20 0138  WBC 8.3  --  10.5  NEUTROABS 6.8  --   --   HGB 9.8* 9.9* 10.0*  HCT 31.5* 29.0* 32.2*  MCV 78.4*  --  78.0*  PLT 321  --  346   Cardiac Enzymes: No results for input(s): CKTOTAL, CKMB, CKMBINDEX, TROPONINI in the last 168 hours. BNP (last 3 results) No results for input(s): PROBNP in the last 8760 hours. CBG: No results for input(s): GLUCAP in the last 168 hours. D-Dimer:  No results for input(s): DDIMER in the last 72 hours. Hgb A1c: No results  for input(s): HGBA1C in the last 72 hours. Lipid Profile: Recent Labs    09/27/20 0138  CHOL 202*  HDL 56  LDLCALC 128*  TRIG 88  CHOLHDL 3.6   Thyroid function studies: No results for input(s): TSH, T4TOTAL, T3FREE, THYROIDAB in the last 72 hours.  Invalid input(s): FREET3 Anemia work up: No results for input(s): VITAMINB12, FOLATE, FERRITIN, TIBC, IRON, RETICCTPCT in the last 72 hours. Sepsis Labs: Recent Labs  Lab 09/26/20 0133 09/27/20 0138 09/27/20 0853  PROCALCITON  --   --  <0.10  WBC 8.3 10.5  --     Microbiology Recent Results (from the past 240 hour(s))  Resp Panel by RT-PCR (Flu A&B, Covid) Nasopharyngeal Swab     Status: None   Collection Time: 09/26/20  1:34 AM   Specimen: Nasopharyngeal Swab; Nasopharyngeal(NP) swabs in vial transport medium  Result Value Ref Range Status   SARS Coronavirus 2 by RT PCR NEGATIVE NEGATIVE Final    Comment: (NOTE) SARS-CoV-2 target nucleic acids are NOT DETECTED.  The SARS-CoV-2 RNA is generally detectable in upper respiratory specimens during the acute phase of infection. The lowest concentration of SARS-CoV-2 viral copies this assay can detect is 138 copies/mL. A negative result does not preclude SARS-Cov-2 infection and should not be used as the sole basis for treatment or other patient management decisions. A negative result may occur with  improper specimen collection/handling, submission of specimen other than nasopharyngeal swab, presence of viral mutation(s) within the areas targeted by this assay, and inadequate number of viral copies(<138 copies/mL). A negative result must be combined with clinical observations, patient history, and epidemiological information. The expected result is Negative.  Fact Sheet for Patients:  EntrepreneurPulse.com.au  Fact Sheet for Healthcare Providers:  IncredibleEmployment.be  This test is no t yet approved or cleared by the [place] FDA  and  has been authorized for detection and/or diagnosis of SARS-CoV-2 by FDA under an Emergency Use Authorization (EUA). This EUA will remain  in effect (meaning this test can be used) for the duration of the COVID-19 declaration under Section 564(b)(1) of the Act, 21 U.S.C.section 360bbb-3(b)(1), unless the authorization is terminated  or revoked sooner.       Influenza A by PCR NEGATIVE NEGATIVE Final   Influenza B by PCR NEGATIVE NEGATIVE Final    Comment: (NOTE) The Xpert Xpress SARS-CoV-2/FLU/RSV plus assay is intended as an aid in the diagnosis of influenza from Nasopharyngeal swab specimens and should not be used as a sole basis for treatment. Nasal washings and aspirates are unacceptable for Xpert Xpress SARS-CoV-2/FLU/RSV testing.  Fact Sheet for Patients: EntrepreneurPulse.com.au  Fact Sheet for Healthcare Providers: IncredibleEmployment.be  This test is not yet approved or cleared by the [place] FDA and has been authorized for detection and/or diagnosis of SARS-CoV-2 by FDA under an Emergency Use Authorization (EUA). This EUA will remain in effect (meaning this test can be used) for the duration of the COVID-19 declaration under Section 564(b)(1) of the Act, 21 U.S.C. section 360bbb-3(b)(1), unless the authorization is terminated or revoked.  Performed at [facility], [place] [address], [place], [place] [address]     Procedures and diagnostic studies:  CT Angio Chest PE W and/or Wo Contrast  Result Date: 09/26/2020 CLINICAL DATA:  Chest pain.  Effusion suspected. EXAM: CT ANGIOGRAPHY CHEST WITH CONTRAST TECHNIQUE: Multidetector CT imaging of the chest was performed using the standard protocol during  bolus administration of intravenous contrast. Multiplanar CT image reconstructions and MIPs were obtained to evaluate the vascular anatomy. CONTRAST:  68mL OMNIPAQUE IOHEXOL 350 MG/ML SOLN COMPARISON:  None.  FINDINGS: Cardiovascular: Satisfactory opacification of the pulmonary arteries to the proximal segmental level. No evidence of pulmonary embolism. Limited evaluation at the subsegmental level due to respiratory motion artifact. Normal heart size. No pericardial effusion. The thoracic aorta is normal in caliber. Mild atherosclerotic plaque. Mediastinum/Nodes: Bilateral axillary lymphadenopathy. No enlarged mediastinal or hilar lymph nodes. Thyroid gland, trachea, and esophagus demonstrate no significant findings. Lungs/Pleura: Slightly more consolidative airspace opacities at the lung bases. Diffuse, lower lobe predominant, peribronchovascular airspace opacities. Bilateral trace pleural effusions. No pneumothorax. Upper Abdomen: No acute abnormality. Musculoskeletal: No chest wall abnormality. Severe chronic degenerative changes of bilateral shoulders with cortical erosion and destruction of the femoral heads and acetabuli. Question underlying joint effusions. Review of the MIP images confirms the above findings. IMPRESSION: 1. No central or proximal segmental pulmonary embolus with limited evaluation more distally due to motion artifact. 2. Diffuse, lower lobe predominant, peribronchovascular airspace opacities. Bilateral trace pleural effusions. Findings could represent pulmonary edema versus infection/inflammation. 3. Severe chronic degenerative changes of bilateral shoulders with cortical erosion and destruction of the femoral heads and acetabuli. Question underlying joint effusions. 4. Bilateral axillary lymphadenopathy. Electronically Signed   By: Iven Finn M.D.   On: 09/26/2020 03:48   DG Chest Portable 1 View  Result Date: 09/26/2020 CLINICAL DATA:  Shortness of breath EXAM: PORTABLE CHEST 1 VIEW COMPARISON:  09/07/2020 FINDINGS: Moderate cardiomegaly. Bilateral basilar predominant interstitial opacities. No pleural effusion or pneumothorax. IMPRESSION: Cardiomegaly and bilateral basilar predominant  interstitial opacities, likely pulmonary edema. Electronically Signed   By: Ulyses Jarred M.D.   On: 09/26/2020 03:13   ECHOCARDIOGRAM COMPLETE  Result Date: 09/26/2020    ECHOCARDIOGRAM REPORT   Patient Name:   DAMONTAE LOPPNOW Date of Exam: 09/26/2020 Medical Rec #:  [id number]        Height:       63.0 in Accession #:    [id number]       Weight:       140.0 lb Date of Birth:  [birth year]       BSA:          1.662 m Patient Age:    43 years         BP:           109/60 mmHg Patient Gender: M                HR:           93 bpm. Exam Location:  Inpatient Procedure: 2D Echo, Cardiac Doppler and Color Doppler Indications:    CHF I50.9  History:        Patient has prior history of Echocardiogram examinations, most                 recent 08/22/2019. Previous Myocardial Infarction and CAD.  Sonographer:    Merrie Roof RDCS Referring Phys: [id number] [place]  1. Left ventricular ejection fraction, by estimation, is 30 to 35%. The left ventricle has moderately decreased function. The left ventricle demonstrates regional wall motion abnormalities. Inferior/inferoseptal akinesis. The left ventricular internal cavity size was mildly dilated. Left ventricular diastolic parameters are consistent with Grade II diastolic dysfunction (pseudonormalization). Elevated left atrial pressure.  2. Right ventricular systolic function is normal. The right ventricular size is normal. Tricuspid regurgitation signal is inadequate for assessing PA pressure.  3. Left atrial size was mildly dilated.  4. The aortic valve was not well visualized. Aortic valve regurgitation is mild to moderate. Eccentric posterior directed AI jet, visually appears mild but may be underestimating given eccentric jet, moderate by PHT. No aortic stenosis is present.  5. The inferior vena cava is normal in size with greater than 50% respiratory variability, suggesting right atrial pressure of 3 mmHg.  6. The mitral valve is normal in structure. No  evidence of mitral valve regurgitation. FINDINGS  Left Ventricle: Left ventricular ejection fraction, by estimation, is 30 to 35%. The left ventricle has moderately decreased function. The left ventricle demonstrates regional wall motion abnormalities. The left ventricular internal cavity size was mildly dilated. There is no left ventricular hypertrophy. Left ventricular diastolic parameters are consistent with Grade II diastolic dysfunction (pseudonormalization). Elevated left atrial pressure. Right Ventricle: The right ventricular size is normal. No increase in right ventricular wall thickness. Right ventricular systolic function is normal. Tricuspid regurgitation signal is inadequate for assessing PA pressure. Left Atrium: Left atrial size was mildly dilated. Right Atrium: Right atrial size was normal in size. Pericardium: Trivial pericardial effusion is present. Mitral Valve: The mitral valve is normal in structure. No evidence of mitral valve regurgitation. Tricuspid Valve: The tricuspid valve is normal in structure. Tricuspid valve regurgitation is trivial. Aortic Valve: The aortic valve was not well visualized. Aortic valve regurgitation is mild to moderate. No aortic stenosis is present. Pulmonic Valve: The pulmonic valve was not well visualized. Pulmonic valve regurgitation is not visualized. Aorta: The aortic root is normal in size and structure. Venous: The inferior vena cava is normal in size with greater than 50% respiratory variability, suggesting right atrial pressure of 3 mmHg. IAS/Shunts: The interatrial septum was not well visualized.  LEFT VENTRICLE PLAX 2D LVIDd:         5.90 cm      Diastology LVIDs:         4.80 cm      LV e' medial:    5.33 cm/s LV PW:         0.80 cm      LV E/e' medial:  16.3 LV IVS:        0.60 cm      LV e' lateral:   5.77 cm/s LVOT diam:     2.10 cm      LV E/e' lateral: 15.1 LV SV:         58 LV SV Index:   35 LVOT Area:     3.46 cm  LV Volumes (MOD) LV vol d, MOD A2C:  197.0 ml LV vol d, MOD A4C: 205.0 ml LV vol s, MOD A2C: 147.0 ml LV vol s, MOD A4C: 120.5 ml LV SV MOD A2C:     50.0 ml LV SV MOD A4C:     205.0 ml LV SV MOD BP:      67.1 ml RIGHT VENTRICLE RV Basal diam:  3.70 cm LEFT ATRIUM             Index       RIGHT ATRIUM           Index LA diam:        3.70 cm 2.23 cm/m  RA Area:     18.10 cm LA Vol (A2C):   77.3 ml 46.52 ml/m RA Volume:   46.90 ml  28.22 ml/m LA Vol (A4C):   46.6 ml 28.04 ml/m LA Biplane Vol: 62.2 ml 37.43 ml/m  AORTIC  VALVE LVOT Vmax:   88.80 cm/s LVOT Vmean:  56.500 cm/s LVOT VTI:    0.168 m  AORTA Ao Root diam: 3.10 cm MITRAL VALVE MV Area (PHT): 6.12 cm    SHUNTS MV Decel Time: 124 msec    Systemic VTI:  0.17 m MV E velocity: 87.00 cm/s  Systemic Diam: 2.10 cm MV A velocity: 94.70 cm/s MV E/A ratio:  0.92 Oswaldo Milian MD Electronically signed by Oswaldo Milian MD Signature Date/Time: 09/26/2020/2:24:59 PM    Final                LOS: 1 day   Caster Fayette  [facility]   Pager on [email or web address]. If 7PM-7AM, please contact night-coverage at [email or web address]     09/27/2020, 2:35 PM

## 2020-09-27 NOTE — Progress Notes (Signed)
Pt remains stable. Rn called Md on call for Cardiology Dr. Domenic Polite for clarification of when heparin drip needs to be stopped for cardiac cath in am tomorrow. Dr. Domenic Polite was unsure himself and is supposed to have rounding md today place order in computer as to when to stop Heparin gtt. Rn will continue to monitor.

## 2020-09-27 NOTE — Progress Notes (Signed)
[facility] for Heparin Indication: chest pain/ACS  Allergies  Allergen Reactions  . Infliximab Nausea Only and Shortness Of Breath    Patient Measurements: Height: 5\' 3"  (160 cm) Weight: 58.5 kg (128 lb 15.5 oz) IBW/kg (Calculated) : 56.9 Heparin Dosing Weight: 63.5 kg   Vital Signs: Temp: 97.7 F (36.5 C) (06/05 0439) Temp Source: Oral (06/05 0439) BP: 101/62 (06/05 1438) Pulse Rate: 94 (06/05 1438)  Labs: Recent Labs    09/26/20 0133 09/26/20 0216 09/26/20 0405 09/26/20 1030 09/26/20 1315 09/26/20 1827 09/27/20 0138 09/27/20 0853 09/27/20 1506  HGB 9.8* 9.9*  --   --   --   --  10.0*  --   --   HCT 31.5* 29.0*  --   --   --   --  32.2*  --   --   PLT 321  --   --   --   --   --  346  --   --   HEPARINUNFRC  --   --   --    < >  --    < > 0.16* 0.41 0.59  CREATININE 0.68 0.50*  --   --   --   --  0.60*  --   --   TROPONINIHS 107*  --  144*  --  84*  --   --   --   --    < > = values in this interval not displayed.    Estimated Creatinine Clearance: 80 mL/min (A) (by C-G formula based on SCr of 0.6 mg/dL (L)).   Medical History: Past Medical History:  Diagnosis Date  . Anemia    H/o using iron in the past   . Arthritis    rheumatoid  . Chronic back pain   . Hyperlipidemia   . Joint pain   . Joint swelling   . Rheumatoid arthritis(714.0)     Medications:  Infusions:  . heparin 1,350 Units/hr (09/27/20 1344)    Assessment: 60 yo M who presented with shortness of breath. Chest CT negative for PE, Troponin elevated, CXR/EKG abnormal. Not on anticoagulation PTA. CBC- Hg low at 9.9, pltc WNL. Renal function at baseline.   This evening, 09/27/20:  2nd Heparin level therapeutic at 0.59 with heparin infusing at 1350 units/hour  CBC- Hg low/stable at 10; pltc WNL  No infusion interruptions/issues and no bleeding per nurse  Goal of Therapy:  Heparin level 0.3-0.7 units/ml Monitor platelets by anticoagulation  protocol: Yes   Plan:  Continue heparin infusion at 1350 units/hr Monitor daily heparin level, CBC, s/s bleeding  Bernadene Garside P. Legrand Como, PharmD, [place] Please utilize Amion for appropriate phone number to reach the unit pharmacist ([place]) 09/27/2020 4:07 PM

## 2020-09-28 ENCOUNTER — Encounter (HOSPITAL_COMMUNITY)
Admission: EM | Disposition: A | Discharge status: Home / Self Care | Payer: Self-pay | Source: Home / Self Care | Attending: Internal Medicine

## 2020-09-28 ENCOUNTER — Encounter (HOSPITAL_COMMUNITY): Payer: Self-pay | Admitting: Family Medicine

## 2020-09-28 DIAGNOSIS — R079 Chest pain, unspecified: Secondary | ICD-10-CM

## 2020-09-28 DIAGNOSIS — R778 Other specified abnormalities of plasma proteins: Secondary | ICD-10-CM

## 2020-09-28 DIAGNOSIS — R7989 Other specified abnormal findings of blood chemistry: Secondary | ICD-10-CM

## 2020-09-28 DIAGNOSIS — I5023 Acute on chronic systolic (congestive) heart failure: Secondary | ICD-10-CM

## 2020-09-28 DIAGNOSIS — I2510 Atherosclerotic heart disease of native coronary artery without angina pectoris: Secondary | ICD-10-CM

## 2020-09-28 HISTORY — PX: RIGHT/LEFT HEART CATH AND CORONARY ANGIOGRAPHY: CATH118266

## 2020-09-28 LAB — POCT I-STAT 7, (LYTES, BLD GAS, ICA,H+H)
Acid-Base Excess: 5 mmol/L — ABNORMAL HIGH (ref 0.0–2.0)
Bicarbonate: 29.9 mmol/L — ABNORMAL HIGH (ref 20.0–28.0)
Calcium, Ion: 1.21 mmol/L (ref 1.15–1.40)
HCT: 29 % — ABNORMAL LOW (ref 39.0–52.0)
Hemoglobin: 9.9 g/dL — ABNORMAL LOW (ref 13.0–17.0)
O2 Saturation: 100 %
Potassium: 3.7 mmol/L (ref 3.5–5.1)
Sodium: 139 mmol/L (ref 135–145)
TCO2: 31 mmol/L (ref 22–32)
pCO2 arterial: 43.8 mmHg (ref 32.0–48.0)
pH, Arterial: 7.443 (ref 7.350–7.450)
pO2, Arterial: 204 mmHg — ABNORMAL HIGH (ref 83.0–108.0)

## 2020-09-28 LAB — BASIC METABOLIC PANEL
Anion gap: 8 (ref 5–15)
BUN: 16 mg/dL (ref 6–20)
CO2: 29 mmol/L (ref 22–32)
Calcium: 9.7 mg/dL (ref 8.9–10.3)
Chloride: 102 mmol/L (ref 98–111)
Creatinine, Ser: 0.56 mg/dL — ABNORMAL LOW (ref 0.61–1.24)
GFR, Estimated: >60 mL/min (ref >60)
Glucose, Bld: 98 mg/dL (ref 70–99)
Potassium: 4.1 mmol/L (ref 3.5–5.1)
Sodium: 139 mmol/L (ref 135–145)

## 2020-09-28 LAB — CBC
HCT: 31.2 % — ABNORMAL LOW (ref 39.0–52.0)
Hemoglobin: 9.4 g/dL — ABNORMAL LOW (ref 13.0–17.0)
MCH: 24 pg — ABNORMAL LOW (ref 26.0–34.0)
MCHC: 30.1 g/dL (ref 30.0–36.0)
MCV: 79.6 fL — ABNORMAL LOW (ref 80.0–100.0)
Platelets: 320 10*3/uL (ref 150–400)
RBC: 3.92 MIL/uL — ABNORMAL LOW (ref 4.22–5.81)
RDW: 15.6 % — ABNORMAL HIGH (ref 11.5–15.5)
WBC: 6.4 10*3/uL (ref 4.0–10.5)
nRBC: 0 % (ref 0.0–0.2)

## 2020-09-28 LAB — POCT I-STAT EG7
Acid-Base Excess: 5 mmol/L — ABNORMAL HIGH (ref 0.0–2.0)
Bicarbonate: 30.3 mmol/L — ABNORMAL HIGH (ref 20.0–28.0)
Calcium, Ion: 1.21 mmol/L (ref 1.15–1.40)
HCT: 30 % — ABNORMAL LOW (ref 39.0–52.0)
Hemoglobin: 10.2 g/dL — ABNORMAL LOW (ref 13.0–17.0)
O2 Saturation: 68 %
Potassium: 3.7 mmol/L (ref 3.5–5.1)
Sodium: 139 mmol/L (ref 135–145)
TCO2: 32 mmol/L (ref 22–32)
pCO2, Ven: 47.5 mmHg (ref 44.0–60.0)
pH, Ven: 7.412 (ref 7.250–7.430)
pO2, Ven: 36 mmHg (ref 32.0–45.0)

## 2020-09-28 LAB — HEPARIN LEVEL (UNFRACTIONATED): Heparin Unfractionated: 0.31 IU/mL (ref 0.30–0.70)

## 2020-09-28 LAB — SURGICAL PCR SCREEN
MRSA, PCR: NEGATIVE
Staphylococcus aureus: NEGATIVE

## 2020-09-28 SURGERY — RIGHT/LEFT HEART CATH AND CORONARY ANGIOGRAPHY
Anesthesia: LOCAL

## 2020-09-28 MED ORDER — SODIUM CHLORIDE 0.9% FLUSH: 3.0000 mL | INTRAVENOUS | Status: DC | PRN

## 2020-09-28 MED ORDER — FENTANYL CITRATE (PF) 100 MCG/2ML IJ SOLN
INTRAMUSCULAR | Status: AC
  Filled 2020-09-28: qty 2

## 2020-09-28 MED ORDER — SODIUM CHLORIDE 0.9 % IV SOLN: 250.0000 mL | INTRAVENOUS | Status: DC | PRN

## 2020-09-28 MED ORDER — FENTANYL CITRATE (PF) 100 MCG/2ML IJ SOLN
INTRAMUSCULAR | Status: DC | PRN
  Administered 2020-09-28: 25 ug via INTRAVENOUS

## 2020-09-28 MED ORDER — HEPARIN (PORCINE) IN NACL 1000-0.9 UT/500ML-% IV SOLN
INTRAVENOUS | Status: DC | PRN
Start: 2020-09-28 — End: 2020-09-28
  Administered 2020-09-28 (×2): 500 mL

## 2020-09-28 MED ORDER — MIDAZOLAM HCL 2 MG/2ML IJ SOLN
INTRAMUSCULAR | Status: DC | PRN
  Administered 2020-09-28: 1 mg via INTRAVENOUS

## 2020-09-28 MED ORDER — HYDRALAZINE HCL 20 MG/ML IJ SOLN: 10.0000 mg | INTRAMUSCULAR | Status: AC | PRN

## 2020-09-28 MED ORDER — MIDAZOLAM HCL 2 MG/2ML IJ SOLN
INTRAMUSCULAR | Status: AC
  Filled 2020-09-28: qty 2

## 2020-09-28 MED ORDER — ENOXAPARIN SODIUM 40 MG/0.4ML IJ SOSY
40.0000 mg | PREFILLED_SYRINGE | INTRAMUSCULAR | Status: DC
  Administered 2020-09-29: 40 mg via SUBCUTANEOUS
  Filled 2020-09-28: qty 0.4

## 2020-09-28 MED ORDER — LIDOCAINE HCL (PF) 1 % IJ SOLN
INTRAMUSCULAR | Status: DC | PRN
Start: 2020-09-28 — End: 2020-09-28
  Administered 2020-09-28: 5 mL

## 2020-09-28 MED ORDER — IOHEXOL 350 MG/ML SOLN
INTRAVENOUS | Status: DC | PRN
  Administered 2020-09-28: 40 mL

## 2020-09-28 MED ORDER — HEPARIN (PORCINE) IN NACL 1000-0.9 UT/500ML-% IV SOLN
INTRAVENOUS | Status: AC
  Filled 2020-09-28: qty 1000

## 2020-09-28 MED ORDER — SODIUM CHLORIDE 0.9% FLUSH
3.0000 mL | Freq: Two times a day (BID) | INTRAVENOUS | Status: DC
  Administered 2020-09-28 – 2020-09-29 (×2): 3 mL via INTRAVENOUS

## 2020-09-28 MED ORDER — LIDOCAINE HCL (PF) 1 % IJ SOLN
INTRAMUSCULAR | Status: AC
  Filled 2020-09-28: qty 30

## 2020-09-28 MED ORDER — LABETALOL HCL 5 MG/ML IV SOLN: 10.0000 mg | INTRAVENOUS | Status: AC | PRN

## 2020-09-28 SURGICAL SUPPLY — 11 items
CATH INFINITI 5FR MULTPACK ANG (CATHETERS) ×1 IMPLANT
CATH SWAN GANZ 7F STRAIGHT (CATHETERS) ×1 IMPLANT
KIT HEART LEFT (KITS) ×2 IMPLANT
KIT MICROPUNCTURE NIT STIFF (SHEATH) ×1 IMPLANT
PACK CARDIAC CATHETERIZATION (CUSTOM PROCEDURE TRAY) ×2 IMPLANT
SHEATH PINNACLE 5F 10CM (SHEATH) ×1 IMPLANT
SHEATH PINNACLE 7F 10CM (SHEATH) ×1 IMPLANT
SHEATH PROBE COVER 6X72 (BAG) ×1 IMPLANT
TRANSDUCER W/STOPCOCK (MISCELLANEOUS) ×2 IMPLANT
TUBING CIL FLEX 10 FLL-RA (TUBING) ×2 IMPLANT
WIRE EMERALD 3MM-J .035X150CM (WIRE) ×1 IMPLANT

## 2020-09-28 NOTE — Progress Notes (Signed)
Site area: Right groin a 5 french arterial and 7 french venous sheath was removed  Siite Prior to Removal:  Level 0  Pressure Applied For 20 MINUTES    Bedrest Beginning at 1150p X 4 hours  Manual:   Yes.    Patient Status During Pull:  stable  Post Pull Groin Site:  Level 0  Post Pull Instructions Given:  Yes.    Post Pull Pulses Present:  Yes.    Dressing Applied:  Yes.    Comments:

## 2020-09-28 NOTE — H&P (View-Only) (Signed)
Progress Note  Patient Name: Jonathan [name] Date of Encounter: 09/28/2020  [facility] Cardiologist: Minus Breeding, MD   Subjective   Atypical muscular like pain in chest breathing is fine   Inpatient Medications    Scheduled Meds: . aspirin EC  81 mg Oral Daily  . atorvastatin  80 mg Oral Daily  . furosemide  40 mg Oral Daily  . gabapentin  300 mg Oral BID  . isosorbide mononitrate  60 mg Oral Daily  . leflunomide  10 mg Oral Daily  . pantoprazole  40 mg Oral Daily  . sodium chloride flush  3 mL Intravenous Q12H  . spironolactone  25 mg Oral Daily   Continuous Infusions: . sodium chloride    . sodium chloride    . heparin 1,350 Units/hr (09/28/20 0604)   PRN Meds: sodium chloride, acetaminophen **OR** acetaminophen, hydrALAZINE, HYDROcodone-acetaminophen, morphine injection, ondansetron (ZOFRAN) IV, polyethylene glycol, sodium chloride flush   Vital Signs    Vitals:   09/27/20 1438 09/27/20 2040 09/28/20 0425 09/28/20 0446  BP: 101/62 101/65 95/63   Pulse: 94 95 89   Resp: 16 18 18    Temp:  (!) 97.4 F (36.3 C) 98.1 F (36.7 C)   TempSrc:   Oral   SpO2: 98% 100% 98%   Weight:    58.5 kg  Height:        Intake/Output Summary (Last 24 hours) at 09/28/2020 0828 Last data filed at 09/28/2020 0444 Gross per 24 hour  Intake 2652 ml  Output 2050 ml  Net 602 ml   Last 3 Weights 09/28/2020 09/27/2020 09/26/2020  Weight (lbs) 128 lb 15.5 oz 128 lb 15.5 oz 140 lb  Weight (kg) 58.5 kg 58.5 kg 63.504 kg      Telemetry    NSR 70-120s, NSVT x9 beats - Personally Reviewed  ECG    No new ECG - Personally Reviewed  Physical Exam   Chronically ill black male Lungs clear Speech impediment ? 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   Labs    High Sensitivity Troponin:   Recent Labs  Lab 09/07/20 0644 09/07/20 0844 09/26/20 0133 09/26/20 0405 09/26/20 1315  TROPONINIHS 20* 23* 107* 144* 84*       Chemistry Recent Labs  Lab 09/26/20 0133 09/26/20 0216 09/27/20 0138 09/28/20 0519  NA 137 139 137 139  K 4.1 3.2* 3.5 4.1  CL 105 104 103 102  CO2 23  --  28 29  GLUCOSE 124* 131* 157* 98  BUN 8 7 16 16   CREATININE 0.68 0.50* 0.60* 0.56*  CALCIUM 8.8*  --  9.4 9.7  PROT 6.9  --   --   --   ALBUMIN 2.9*  --   --   --   AST 34  --   --   --   ALT 12  --   --   --   ALKPHOS 75  --   --   --   BILITOT 1.3*  --   --   --   GFRNONAA >60  --  >60 >60  ANIONGAP 9  --  6 8     Hematology Recent Labs  Lab 09/26/20 0133 09/26/20 0216 09/27/20 0138 09/28/20 0519  WBC 8.3  --  10.5 6.4  RBC 4.02*  --  4.13* 3.92*  HGB 9.8* 9.9* 10.0* 9.4*  HCT 31.5* 29.0* 32.2* 31.2*  MCV 78.4*  --  78.0* 79.6*  MCH 24.4*  --  24.2* 24.0*  MCHC 31.1  --  31.1 30.1  RDW 15.4  --  15.5 15.6*  PLT 321  --  346 320    BNP Recent Labs  Lab 09/26/20 0133  BNP 488.9*     DDimer No results for input(s): DDIMER in the last 168 hours.   Radiology    ECHOCARDIOGRAM COMPLETE  Result Date: 09/26/2020    ECHOCARDIOGRAM REPORT   [name] Date of Exam: 09/26/2020 Medical Rec #:  [id number]        Height:       63.0 in Accession #:    [id number]       Weight:       140.0 lb Date of Birth:  09/25/1960       BSA:          1.662 m Patient Age:    60 years         BP:           109/60 mmHg Patient Gender: M                HR:           93 bpm. Exam Location:  Inpatient Procedure: 2D Echo, Cardiac Doppler and Color Doppler Indications:    CHF I50.9  History:        Patient has prior history of Echocardiogram examinations, most                 recent 08/22/2019. Previous Myocardial Infarction and CAD.  Sonographer:    Merrie Roof RDCS Referring Phys: [id number] [place]  1. Left ventricular ejection fraction, by estimation, is 30 to 35%. The left ventricle has moderately decreased function. The left ventricle demonstrates regional wall motion abnormalities.  Inferior/inferoseptal akinesis. The left ventricular internal cavity size was mildly dilated. Left ventricular diastolic parameters are consistent with Grade II diastolic dysfunction (pseudonormalization). Elevated left atrial pressure.  2. Right ventricular systolic function is normal. The right ventricular size is normal. Tricuspid regurgitation signal is inadequate for assessing PA pressure.  3. Left atrial size was mildly dilated.  4. The aortic valve was not well visualized. Aortic valve regurgitation is mild to moderate. Eccentric posterior directed AI jet, visually appears mild but may be underestimating given eccentric jet, moderate by PHT. No aortic stenosis is present.  5. The inferior vena cava is normal in size with greater than 50% respiratory variability, suggesting right atrial pressure of 3 mmHg.  6. The mitral valve is normal in structure. No evidence of mitral valve regurgitation. FINDINGS  Left Ventricle: Left ventricular ejection fraction, by estimation, is 30 to 35%. The left ventricle has moderately decreased function. The left ventricle demonstrates regional wall motion abnormalities. The left ventricular internal cavity size was mildly dilated. There is no left ventricular hypertrophy. Left ventricular diastolic parameters are consistent with Grade II diastolic dysfunction (pseudonormalization). Elevated left atrial pressure. Right Ventricle: The right ventricular size is normal. No increase in right ventricular wall thickness. Right ventricular systolic function is normal. Tricuspid regurgitation signal is inadequate for assessing PA pressure. Left Atrium: Left atrial size was mildly dilated. Right Atrium: Right atrial size was normal in size. Pericardium: Trivial pericardial effusion is present. Mitral Valve: The mitral valve is normal in structure. No evidence of mitral valve regurgitation. Tricuspid Valve: The tricuspid valve is normal in structure. Tricuspid valve regurgitation is  trivial. Aortic Valve: The aortic valve was not well visualized.  Aortic valve regurgitation is mild to moderate. No aortic stenosis is present. Pulmonic Valve: The pulmonic valve was not well visualized. Pulmonic valve regurgitation is not visualized. Aorta: The aortic root is normal in size and structure. Venous: The inferior vena cava is normal in size with greater than 50% respiratory variability, suggesting right atrial pressure of 3 mmHg. IAS/Shunts: The interatrial septum was not well visualized.  LEFT VENTRICLE PLAX 2D LVIDd:         5.90 cm      Diastology LVIDs:         4.80 cm      LV e' medial:    5.33 cm/s LV PW:         0.80 cm      LV E/e' medial:  16.3 LV IVS:        0.60 cm      LV e' lateral:   5.77 cm/s LVOT diam:     2.10 cm      LV E/e' lateral: 15.1 LV SV:         58 LV SV Index:   35 LVOT Area:     3.46 cm  LV Volumes (MOD) LV vol d, MOD A2C: 197.0 ml LV vol d, MOD A4C: 205.0 ml LV vol s, MOD A2C: 147.0 ml LV vol s, MOD A4C: 120.5 ml LV SV MOD A2C:     50.0 ml LV SV MOD A4C:     205.0 ml LV SV MOD BP:      67.1 ml RIGHT VENTRICLE RV Basal diam:  3.70 cm LEFT ATRIUM             Index       RIGHT ATRIUM           Index LA diam:        3.70 cm 2.23 cm/m  RA Area:     18.10 cm LA Vol (A2C):   77.3 ml 46.52 ml/m RA Volume:   46.90 ml  28.22 ml/m LA Vol (A4C):   46.6 ml 28.04 ml/m LA Biplane Vol: 62.2 ml 37.43 ml/m  AORTIC VALVE LVOT Vmax:   88.80 cm/s LVOT Vmean:  56.500 cm/s LVOT VTI:    0.168 m  AORTA Ao Root diam: 3.10 cm MITRAL VALVE MV Area (PHT): 6.12 cm    SHUNTS MV Decel Time: 124 msec    Systemic VTI:  0.17 m MV E velocity: 87.00 cm/s  Systemic Diam: 2.10 cm MV A velocity: 94.70 cm/s MV E/A ratio:  0.92 Oswaldo Milian MD Electronically signed by Oswaldo Milian MD Signature Date/Time: 09/26/2020/2:24:59 PM    Final     Cardiac Studies   Echo 09/26/20: 1. Left ventricular ejection fraction, by estimation, is 30 to 35%. The  left ventricle has moderately decreased  function. The left ventricle  demonstrates regional wall motion abnormalities. Inferior/inferoseptal  akinesis. The left ventricular internal  cavity size was mildly dilated. Left ventricular diastolic parameters are  consistent with Grade II diastolic dysfunction (pseudonormalization).  Elevated left atrial pressure.  2. Right ventricular systolic function is normal. The right ventricular  size is normal. Tricuspid regurgitation signal is inadequate for assessing  PA pressure.  3. Left atrial size was mildly dilated.  4. The aortic valve was not well visualized. Aortic valve regurgitation  is mild to moderate. Eccentric posterior directed AI jet, visually appears  mild but may be underestimating given eccentric jet, moderate by PHT. No  aortic stenosis is present.  5. The inferior vena  cava is normal in size with greater than 50%  respiratory variability, suggesting right atrial pressure of 3 mmHg.  6. The mitral valve is normal in structure. No evidence of mitral valve  regurgitation.   Patient Profile     60 y.o. male with a hx of CAD with known CTO RCA, RA, ischemic cardiomyopathy, venous insufficiency, chronic wounds who is being seen 09/26/2020 for the evaluation of heart failure   Assessment & Plan    1. CHF:  Systolic EF 68-59% known CTO RCA by cath 07/2019 per Dr Gardiner Rhyme for repeat cath today given CHF and worsening systolic function His chest pain is atypical and muscular in nature. He is euvolemic currently continue current lasix PO post cath Troponin elevation: Mild troponin elevation, 107 > 144 >84.     -Plan cath as above -Continue heparin gtt x 48 hours d/c post cath if no intervention  -Continue aspirin, statin.  LDL 128, high dose statin     For questions or updates, please contact [place] Please consult [email or web address] for contact info under        Signed, Jenkins Rouge, MD  09/28/2020, 8:28 AM

## 2020-09-28 NOTE — Progress Notes (Signed)
Progress Note  Patient Name: Jonathan [name] Date of Encounter: 09/28/2020  [facility] Cardiologist: Minus Breeding, MD   Subjective   Atypical muscular like pain in chest breathing is fine   Inpatient Medications    Scheduled Meds: . aspirin EC  81 mg Oral Daily  . atorvastatin  80 mg Oral Daily  . furosemide  40 mg Oral Daily  . gabapentin  300 mg Oral BID  . isosorbide mononitrate  60 mg Oral Daily  . leflunomide  10 mg Oral Daily  . pantoprazole  40 mg Oral Daily  . sodium chloride flush  3 mL Intravenous Q12H  . spironolactone  25 mg Oral Daily   Continuous Infusions: . sodium chloride    . sodium chloride    . heparin 1,350 Units/hr (09/28/20 0604)   PRN Meds: sodium chloride, acetaminophen **OR** acetaminophen, hydrALAZINE, HYDROcodone-acetaminophen, morphine injection, ondansetron (ZOFRAN) IV, polyethylene glycol, sodium chloride flush   Vital Signs    Vitals:   09/27/20 1438 09/27/20 2040 09/28/20 0425 09/28/20 0446  BP: 101/62 101/65 95/63   Pulse: 94 95 89   Resp: 16 18 18    Temp:  (!) 97.4 F (36.3 C) 98.1 F (36.7 C)   TempSrc:   Oral   SpO2: 98% 100% 98%   Weight:    58.5 kg  Height:        Intake/Output Summary (Last 24 hours) at 09/28/2020 0828 Last data filed at 09/28/2020 0444 Gross per 24 hour  Intake 2652 ml  Output 2050 ml  Net 602 ml   Last 3 Weights 09/28/2020 09/27/2020 09/26/2020  Weight (lbs) 128 lb 15.5 oz 128 lb 15.5 oz 140 lb  Weight (kg) 58.5 kg 58.5 kg 63.504 kg      Telemetry    NSR 70-120s, NSVT x9 beats - Personally Reviewed  ECG    No new ECG - Personally Reviewed  Physical Exam   Chronically ill black male Lungs clear Speech impediment ? 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   Labs    High Sensitivity Troponin:   Recent Labs  Lab 09/07/20 0644 09/07/20 0844 09/26/20 0133 09/26/20 0405 09/26/20 1315  TROPONINIHS 20* 23* 107* 144* 84*       Chemistry Recent Labs  Lab 09/26/20 0133 09/26/20 0216 09/27/20 0138 09/28/20 0519  NA 137 139 137 139  K 4.1 3.2* 3.5 4.1  CL 105 104 103 102  CO2 23  --  28 29  GLUCOSE 124* 131* 157* 98  BUN 8 7 16 16   CREATININE 0.68 0.50* 0.60* 0.56*  CALCIUM 8.8*  --  9.4 9.7  PROT 6.9  --   --   --   ALBUMIN 2.9*  --   --   --   AST 34  --   --   --   ALT 12  --   --   --   ALKPHOS 75  --   --   --   BILITOT 1.3*  --   --   --   GFRNONAA >60  --  >60 >60  ANIONGAP 9  --  6 8     Hematology Recent Labs  Lab 09/26/20 0133 09/26/20 0216 09/27/20 0138 09/28/20 0519  WBC 8.3  --  10.5 6.4  RBC 4.02*  --  4.13* 3.92*  HGB 9.8* 9.9* 10.0* 9.4*  HCT 31.5* 29.0* 32.2* 31.2*  MCV 78.4*  --  78.0* 79.6*  MCH 24.4*  --  24.2* 24.0*  MCHC 31.1  --  31.1 30.1  RDW 15.4  --  15.5 15.6*  PLT 321  --  346 320    BNP Recent Labs  Lab 09/26/20 0133  BNP 488.9*     DDimer No results for input(s): DDIMER in the last 168 hours.   Radiology    ECHOCARDIOGRAM COMPLETE  Result Date: 09/26/2020    ECHOCARDIOGRAM REPORT   [name] Date of Exam: 09/26/2020 Medical Rec #:  [id number]        Height:       63.0 in Accession #:    [id number]       Weight:       140.0 lb Date of Birth:  [birth year]       BSA:          1.662 m Patient Age:    60 years         BP:           109/60 mmHg Patient Gender: M                HR:           93 bpm. Exam Location:  Inpatient Procedure: 2D Echo, Cardiac Doppler and Color Doppler Indications:    CHF I50.9  History:        Patient has prior history of Echocardiogram examinations, most                 recent 08/22/2019. Previous Myocardial Infarction and CAD.  Sonographer:    Merrie Roof RDCS Referring Phys: [id number] [place]  1. Left ventricular ejection fraction, by estimation, is 30 to 35%. The left ventricle has moderately decreased function. The left ventricle demonstrates regional wall motion abnormalities.  Inferior/inferoseptal akinesis. The left ventricular internal cavity size was mildly dilated. Left ventricular diastolic parameters are consistent with Grade II diastolic dysfunction (pseudonormalization). Elevated left atrial pressure.  2. Right ventricular systolic function is normal. The right ventricular size is normal. Tricuspid regurgitation signal is inadequate for assessing PA pressure.  3. Left atrial size was mildly dilated.  4. The aortic valve was not well visualized. Aortic valve regurgitation is mild to moderate. Eccentric posterior directed AI jet, visually appears mild but may be underestimating given eccentric jet, moderate by PHT. No aortic stenosis is present.  5. The inferior vena cava is normal in size with greater than 50% respiratory variability, suggesting right atrial pressure of 3 mmHg.  6. The mitral valve is normal in structure. No evidence of mitral valve regurgitation. FINDINGS  Left Ventricle: Left ventricular ejection fraction, by estimation, is 30 to 35%. The left ventricle has moderately decreased function. The left ventricle demonstrates regional wall motion abnormalities. The left ventricular internal cavity size was mildly dilated. There is no left ventricular hypertrophy. Left ventricular diastolic parameters are consistent with Grade II diastolic dysfunction (pseudonormalization). Elevated left atrial pressure. Right Ventricle: The right ventricular size is normal. No increase in right ventricular wall thickness. Right ventricular systolic function is normal. Tricuspid regurgitation signal is inadequate for assessing PA pressure. Left Atrium: Left atrial size was mildly dilated. Right Atrium: Right atrial size was normal in size. Pericardium: Trivial pericardial effusion is present. Mitral Valve: The mitral valve is normal in structure. No evidence of mitral valve regurgitation. Tricuspid Valve: The tricuspid valve is normal in structure. Tricuspid valve regurgitation is  trivial. Aortic Valve: The aortic valve was not well visualized.  Aortic valve regurgitation is mild to moderate. No aortic stenosis is present. Pulmonic Valve: The pulmonic valve was not well visualized. Pulmonic valve regurgitation is not visualized. Aorta: The aortic root is normal in size and structure. Venous: The inferior vena cava is normal in size with greater than 50% respiratory variability, suggesting right atrial pressure of 3 mmHg. IAS/Shunts: The interatrial septum was not well visualized.  LEFT VENTRICLE PLAX 2D LVIDd:         5.90 cm      Diastology LVIDs:         4.80 cm      LV e' medial:    5.33 cm/s LV PW:         0.80 cm      LV E/e' medial:  16.3 LV IVS:        0.60 cm      LV e' lateral:   5.77 cm/s LVOT diam:     2.10 cm      LV E/e' lateral: 15.1 LV SV:         58 LV SV Index:   35 LVOT Area:     3.46 cm  LV Volumes (MOD) LV vol d, MOD A2C: 197.0 ml LV vol d, MOD A4C: 205.0 ml LV vol s, MOD A2C: 147.0 ml LV vol s, MOD A4C: 120.5 ml LV SV MOD A2C:     50.0 ml LV SV MOD A4C:     205.0 ml LV SV MOD BP:      67.1 ml RIGHT VENTRICLE RV Basal diam:  3.70 cm LEFT ATRIUM             Index       RIGHT ATRIUM           Index LA diam:        3.70 cm 2.23 cm/m  RA Area:     18.10 cm LA Vol (A2C):   77.3 ml 46.52 ml/m RA Volume:   46.90 ml  28.22 ml/m LA Vol (A4C):   46.6 ml 28.04 ml/m LA Biplane Vol: 62.2 ml 37.43 ml/m  AORTIC VALVE LVOT Vmax:   88.80 cm/s LVOT Vmean:  56.500 cm/s LVOT VTI:    0.168 m  AORTA Ao Root diam: 3.10 cm MITRAL VALVE MV Area (PHT): 6.12 cm    SHUNTS MV Decel Time: 124 msec    Systemic VTI:  0.17 m MV E velocity: 87.00 cm/s  Systemic Diam: 2.10 cm MV A velocity: 94.70 cm/s MV E/A ratio:  0.92 Oswaldo Milian MD Electronically signed by Oswaldo Milian MD Signature Date/Time: 09/26/2020/2:24:59 PM    Final     Cardiac Studies   Echo 09/26/20: 1. Left ventricular ejection fraction, by estimation, is 30 to 35%. The  left ventricle has moderately decreased  function. The left ventricle  demonstrates regional wall motion abnormalities. Inferior/inferoseptal  akinesis. The left ventricular internal  cavity size was mildly dilated. Left ventricular diastolic parameters are  consistent with Grade II diastolic dysfunction (pseudonormalization).  Elevated left atrial pressure.  2. Right ventricular systolic function is normal. The right ventricular  size is normal. Tricuspid regurgitation signal is inadequate for assessing  PA pressure.  3. Left atrial size was mildly dilated.  4. The aortic valve was not well visualized. Aortic valve regurgitation  is mild to moderate. Eccentric posterior directed AI jet, visually appears  mild but may be underestimating given eccentric jet, moderate by PHT. No  aortic stenosis is present.  5. The inferior vena  cava is normal in size with greater than 50%  respiratory variability, suggesting right atrial pressure of 3 mmHg.  6. The mitral valve is normal in structure. No evidence of mitral valve  regurgitation.   Patient Profile     60 y.o. male with a hx of CAD with known CTO RCA, RA, ischemic cardiomyopathy, venous insufficiency, chronic wounds who is being seen 09/26/2020 for the evaluation of heart failure   Assessment & Plan    1. CHF:  Systolic EF 42-68% known CTO RCA by cath 07/2019 per Dr Gardiner Rhyme for repeat cath today given CHF and worsening systolic function His chest pain is atypical and muscular in nature. He is euvolemic currently continue current lasix PO post cath Troponin elevation: Mild troponin elevation, 107 > 144 >84.     -Plan cath as above -Continue heparin gtt x 48 hours d/c post cath if no intervention  -Continue aspirin, statin.  LDL 128, high dose statin     For questions or updates, please contact [place] Please consult [email or web address] for contact info under        Signed, Jenkins Rouge, MD  09/28/2020, 8:28 AM

## 2020-09-28 NOTE — Interval H&P Note (Signed)
History and Physical Interval Note:  09/28/2020 10:14 AM  Jonathan [name]  has presented today for surgery, with the diagnosis of acute HFrEF and elevated troponin.  The various methods of treatment have been discussed with the patient and family. After consideration of risks, benefits and other options for treatment, the patient has consented to  Procedure(s): RIGHT/LEFT HEART CATH AND CORONARY ANGIOGRAPHY (N/A) as a surgical intervention.  The patient's history has been reviewed, patient examined, no change in status, stable for surgery.  I have reviewed the patient's chart and labs.  Questions were answered to the patient's satisfaction.    Cath Lab Visit (complete for each Cath Lab visit)  Clinical Evaluation Leading to the Procedure:   ACS: Yes.    Non-ACS:  N/A  Jonathan [name]

## 2020-09-28 NOTE — Progress Notes (Signed)
Pt resting; responds appropriately, offer not c/o. RFA dressing clean,dry, and intact. Awaiting bedrest time to complete for return to [facility].   Call bell at r hand, bed in lowest position; monitoring.

## 2020-09-28 NOTE — Progress Notes (Addendum)
Progress Note    Jonathan [name]  YPP:[id number] DOB: [birth year]  DOA: 09/26/2020 PCP: System, Provider Not In      Brief Narrative:    Medical records reviewed and are as summarized below:  Jonathan [name] is a 60 y.o. male with past medical history of RA, CAD, s/p bilateral great saphenous vein laser ablations, chronic wounds, ischemic cardiomyopathy, neuropathy secondary to RA, HFrEF, chronic pain, who presented to the hospital with chest pain and shortness of breath.  Reportedly, when EMS arrived, his heart rate was in the 140s and oxygen saturation was 80% on room air.  He was admitted to the hospital for acute hypoxic respiratory failure, chest pain and acute on chronic CHF.  He was treated with IV Lasix and oxygen.  He was successfully weaned off of oxygen to room air.  Cardiologist was consulted because of systolic dysfunction, chest pain and elevated troponins.  He underwent right and left heart cath  which showed severe single-vessel coronary artery disease with chronic total occlusion of the mid RCA and mild disease involving the left coronary artery.  There is also evidence of mildly to moderately elevated left heart filling pressure.      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    Acute on chronic combined systolic and diastolic CHF: Continue Lasix and Aldactone.  2D echo on 09/27/2018 showed EF estimated at 30 to 45%, grade 2 diastolic dysfunction.  Follow-up with cardiologist for disposition.  Hypotension: Monitor BP closely and adjust  diuretics/antihypertensives as needed.  Mild chest pain, mildly elevated troponins with underlying CAD: Probably from demand ischemia.  S/p right and left heart cath on 09/28/2020 which showed severe single-vessel coronary artery disease with chronic total occlusion of the mid RCA and mild disease involving the left coronary artery.  There is also evidence of mildly to moderately elevated left heart filling pressure.  Continue aspirin, Lipitor and isosorbide mononitrate.  Rheumatoid arthritis: Continue leflunomide   Diet Order            Diet Heart Room service appropriate? Yes; Fluid consistency: Thin  Diet effective now                    Consultants:  Cardiologist  Procedures:  None    Medications:   . aspirin EC  81 mg Oral Daily  . atorvastatin  80 mg Oral Daily  . [START ON 09/29/2020] enoxaparin (LOVENOX) injection  40 mg Subcutaneous Q24H  . furosemide  40 mg Oral Daily  . gabapentin  300 mg Oral BID  . isosorbide mononitrate  60 mg Oral Daily  . leflunomide  10 mg Oral Daily  . pantoprazole  40 mg Oral Daily  . sodium chloride flush  3 mL Intravenous Q12H  . spironolactone  25 mg Oral Daily   Continuous Infusions: . sodium chloride       Anti-infectives (From admission, onward)   Start     Dose/Rate Route Frequency Ordered Stop   09/26/20 0330  vancomycin (VANCOCIN) IVPB 1000 mg/200 mL  premix        1,000 mg 200 mL/hr over 60 Minutes Intravenous  Once 09/26/20 0325 09/26/20 0504   09/26/20 0330  piperacillin-tazobactam (ZOSYN) IVPB 3.375 g        3.375 g 100 mL/hr over 30 Minutes Intravenous  Once 09/26/20 0325 09/26/20 0429             Family Communication/Anticipated D/C date and plan/Code Status   DVT prophylaxis: enoxaparin (LOVENOX) injection 40 mg Start: 09/29/20 0800     Code Status: Full Code  Family Communication: None Disposition Plan:    Status is: Inpatient  Remains inpatient appropriate because:Inpatient level of care  appropriate due to severity of illness   Dispo: The patient is from: Home              Anticipated d/c is to: Home              Patient currently is not medically stable to d/c.   Difficult to place patient No           Subjective:   He just came back from [facility] after he underwent left heart cath earlier this morning. C/o leg pain and headache.  No dizziness, confusion, chest pain or shortness of breath.  Objective:    Vitals:   09/28/20 1315 09/28/20 1345 09/28/20 1415 09/28/20 1600  BP: (!) 92/53 (!) 121/54 (!) 87/44 (!) 106/59  Pulse: 77 (!) 104 78 87  Resp: 17 (!) 22 14 18   Temp:    98.1 F (36.7 C)  TempSrc:    Oral  SpO2: 96% 100% 98% 100%  Weight:      Height:       No data found.   Intake/Output Summary (Last 24 hours) at 09/28/2020 1805 Last data filed at 09/28/2020 1117 Gross per 24 hour  Intake 483 ml  Output 1510 ml  Net -1027 ml   Filed Weights   09/26/20 1412 09/27/20 0434 09/28/20 0446  Weight: 63.5 kg 58.5 kg 58.5 kg    Exam:  GEN: NAD SKIN:  Superficial wound inferior to the right medial malleolus.  Wound on the dorsal aspects of the left foot proximal to the second through fourth toes.  Hyperpigmented b/l legs and feet.  Dressing on right groin/femoral access is intact.  No bleeding noted. EYES: EOMI no pallor or icterus. ENT: MMM CV: RRR PULM: CTA B ABD: soft, ND, NT, +BS CNS: AAO x 3, non focal EXT: No edema or tenderness          Data Reviewed:   I have personally reviewed following labs and imaging studies:  Labs: Labs show the following:   Basic Metabolic Panel: Recent Labs  Lab 09/26/20 0133 09/26/20 0216 09/26/20 0405 09/27/20 0138 09/27/20 0853 09/28/20 0519 09/28/20 1051  NA 137 139  --  137  --  139 139  139  K 4.1 3.2*  --  3.5  --  4.1 3.7  3.7  CL 105 104  --  103  --  102  --   CO2 23  --   --  28  --  29  --   GLUCOSE 124* 131*  --  157*  --  98  --   BUN 8 7  --  16  --  16  --    CREATININE 0.68 0.50*  --  0.60*  --  0.56*  --   CALCIUM 8.8*  --   --  9.4  --  9.7  --   MG  --   --  1.7  --  2.0  --   --    GFR Estimated Creatinine Clearance: 80 mL/min (A) (by C-G formula based on SCr of 0.56 mg/dL (L)). Liver Function Tests: Recent Labs  Lab 09/26/20 0133  AST 34  ALT 12  ALKPHOS 75  BILITOT 1.3*  PROT 6.9  ALBUMIN 2.9*   No results for input(s): LIPASE, AMYLASE in the last 168 hours. No results for input(s): AMMONIA in the last 168 hours. Coagulation profile No results for input(s): INR, PROTIME in the last 168 hours.  CBC: Recent Labs  Lab 09/26/20 0133 09/26/20 0216 09/27/20 0138 09/28/20 0519 09/28/20 1051  WBC 8.3  --  10.5 6.4  --   NEUTROABS 6.8  --   --   --   --   HGB 9.8* 9.9* 10.0* 9.4* 9.9*  10.2*  HCT 31.5* 29.0* 32.2* 31.2* 29.0*  30.0*  MCV 78.4*  --  78.0* 79.6*  --   PLT 321  --  346 320  --    Cardiac Enzymes: No results for input(s): CKTOTAL, CKMB, CKMBINDEX, TROPONINI in the last 168 hours. BNP (last 3 results) No results for input(s): PROBNP in the last 8760 hours. CBG: No results for input(s): GLUCAP in the last 168 hours. D-Dimer: No results for input(s): DDIMER in the last 72 hours. Hgb A1c: No results for input(s): HGBA1C in the last 72 hours. Lipid Profile: Recent Labs    09/27/20 0138  CHOL 202*  HDL 56  LDLCALC 128*  TRIG 88  CHOLHDL 3.6   Thyroid function studies: No results for input(s): TSH, T4TOTAL, T3FREE, THYROIDAB in the last 72 hours.  Invalid input(s): FREET3 Anemia work up: No results for input(s): VITAMINB12, FOLATE, FERRITIN, TIBC, IRON, RETICCTPCT in the last 72 hours. Sepsis Labs: Recent Labs  Lab 09/26/20 0133 09/27/20 0138 09/27/20 0853 09/28/20 0519  PROCALCITON  --   --  <0.10  --   WBC 8.3 10.5  --  6.4    Microbiology Recent Results (from the past 240 hour(s))  Resp Panel by RT-PCR (Flu A&B, Covid) Nasopharyngeal Swab     Status: None   Collection Time: 09/26/20   1:34 AM   Specimen: Nasopharyngeal Swab; Nasopharyngeal(NP) swabs in vial transport medium  Result Value Ref Range Status   SARS Coronavirus 2 by RT PCR NEGATIVE NEGATIVE Final    Comment: (NOTE) SARS-CoV-2 target nucleic acids are NOT DETECTED.  The SARS-CoV-2 RNA is generally detectable in upper respiratory specimens during the acute phase of infection. The lowest concentration of SARS-CoV-2 viral copies this assay can detect is 138 copies/mL. A negative result does not preclude SARS-Cov-2 infection and should not be used as the sole basis for treatment or other patient management decisions. A negative result may occur with  improper specimen collection/handling, submission of specimen other than nasopharyngeal swab, presence of viral mutation(s) within the areas targeted by this assay, and inadequate number of viral copies(<138 copies/mL). A negative result must be combined with clinical observations, patient history, and epidemiological information. The expected result is Negative.  Fact Sheet for Patients:  EntrepreneurPulse.com.au  Fact Sheet for Healthcare Providers:  IncredibleEmployment.be  This test is no t yet approved or cleared by the [place] FDA and  has been authorized for detection and/or diagnosis of SARS-CoV-2 by FDA under an Emergency Use Authorization (EUA). This EUA will remain  in effect (meaning this test can be used) for the duration  of the COVID-19 declaration under Section 564(b)(1) of the Act, 21 U.S.C.section 360bbb-3(b)(1), unless the authorization is terminated  or revoked sooner.       Influenza A by PCR NEGATIVE NEGATIVE Final   Influenza B by PCR NEGATIVE NEGATIVE Final    Comment: (NOTE) The Xpert Xpress SARS-CoV-2/FLU/RSV plus assay is intended as an aid in the diagnosis of influenza from Nasopharyngeal swab specimens and should not be used as a sole basis for treatment. Nasal washings and aspirates  are unacceptable for Xpert Xpress SARS-CoV-2/FLU/RSV testing.  Fact Sheet for Patients: EntrepreneurPulse.com.au  Fact Sheet for Healthcare Providers: IncredibleEmployment.be  This test is not yet approved or cleared by the [place] FDA and has been authorized for detection and/or diagnosis of SARS-CoV-2 by FDA under an Emergency Use Authorization (EUA). This EUA will remain in effect (meaning this test can be used) for the duration of the COVID-19 declaration under Section 564(b)(1) of the Act, 21 U.S.C. section 360bbb-3(b)(1), unless the authorization is terminated or revoked.  Performed at [facility], [place] [address], [place], [place] [address]   Surgical pcr screen     Status: None   Collection Time: 09/28/20  3:40 AM   Specimen: Nasal Mucosa; Nasal Swab  Result Value Ref Range Status   MRSA, PCR NEGATIVE NEGATIVE Final   Staphylococcus aureus NEGATIVE NEGATIVE Final    Comment: (NOTE) The Xpert SA Assay (FDA approved for NASAL specimens in patients 37 years of age and older), is one component of a comprehensive surveillance program. It is not intended to diagnose infection nor to guide or monitor treatment. Performed at [facility], [place] [address], [place], [place] [address]     Procedures and diagnostic studies:  CARDIAC CATHETERIZATION  Result Date: 09/28/2020 Conclusions: 1. Severe single-vessel coronary artery disease with chronic total occlusion of the mid RCA.  There is mild disease involving the left coronary artery.  Overall appearance is similar to 2019. 2. Mildly to moderately elevated left heart filling pressure. 3. Normal cardiac output/index. Recommendations: 1. Continue medical therapy of acute on chronic HFrEF and ischemic heart disease. 2. Transfer back to [facility] after completion of post-catheterization recovery for ongoing medical management. Nelva Bush, MD [facility]               LOS: 2 days   Rodrick Payson  [facility]   Pager on [email or web address]. If 7PM-7AM, please contact night-coverage at [email or web address]     09/28/2020, 6:05 PM

## 2020-09-28 NOTE — Progress Notes (Signed)
[place] for Heparin Indication: chest pain/ACS  Allergies  Allergen Reactions  . Infliximab Nausea Only and Shortness Of Breath    Patient Measurements: Height: 5\' 3"  (160 cm) Weight: 58.5 kg (128 lb 15.5 oz) IBW/kg (Calculated) : 56.9 Heparin Dosing Weight: 63.5 kg   Vital Signs: Temp: 98.1 F (36.7 C) (06/06 0425) Temp Source: Oral (06/06 0425) BP: 95/63 (06/06 0425) Pulse Rate: 89 (06/06 0425)  Labs: Recent Labs    09/26/20 0133 09/26/20 0216 09/26/20 0405 09/26/20 1030 09/26/20 1315 09/26/20 1827 09/27/20 0138 09/27/20 0853 09/27/20 1506 09/28/20 0519  HGB 9.8* 9.9*  --   --   --   --  10.0*  --   --  9.4*  HCT 31.5* 29.0*  --   --   --   --  32.2*  --   --  31.2*  PLT 321  --   --   --   --   --  346  --   --  320  HEPARINUNFRC  --   --   --    < >  --    < > 0.16* 0.41 0.59 0.31  CREATININE 0.68 0.50*  --   --   --   --  0.60*  --   --  0.56*  TROPONINIHS 107*  --  144*  --  84*  --   --   --   --   --    < > = values in this interval not displayed.    Estimated Creatinine Clearance: 80 mL/min (A) (by C-G formula based on SCr of 0.56 mg/dL (L)).   Medical History: Past Medical History:  Diagnosis Date  . Anemia    H/o using iron in the past   . Arthritis    rheumatoid  . Chronic back pain   . Hyperlipidemia   . Joint pain   . Joint swelling   . Rheumatoid arthritis(714.0)     Medications:  Infusions:  . sodium chloride    . sodium chloride    . heparin 1,350 Units/hr (09/28/20 6606)    Assessment: 60 yo M who presented with shortness of breath. Chest CT negative for PE, Troponin elevated, CXR/EKG abnormal. Not on anticoagulation PTA. CBC- Hg low at 9.9, pltc WNL. Renal function at baseline.   09/28/2020:  Heparin level therapeutic at 0.31 with heparin infusing at 1350 units/hour  CBC- Hg low at 9.4; pltc WNL  No infusion interruptions/issues and no bleeding per nurse  Goal of Therapy:   Heparin level 0.3-0.7 units/ml Monitor platelets by anticoagulation protocol: Yes   Plan:  Continue heparin infusion at 1350 units/hr Monitor daily heparin level, CBC, s/s bleeding Noted plan for cardiac cath today- typically stop heparin infusion once in cath lab  Netta Cedars, PharmD, BCPS 09/28/2020 6:23 AM

## 2020-09-29 DIAGNOSIS — M0550 Rheumatoid polyneuropathy with rheumatoid arthritis of unspecified site: Secondary | ICD-10-CM

## 2020-09-29 LAB — CBC
HCT: 33.3 % — ABNORMAL LOW (ref 39.0–52.0)
Hemoglobin: 10.2 g/dL — ABNORMAL LOW (ref 13.0–17.0)
MCH: 24.5 pg — ABNORMAL LOW (ref 26.0–34.0)
MCHC: 30.6 g/dL (ref 30.0–36.0)
MCV: 79.9 fL — ABNORMAL LOW (ref 80.0–100.0)
Platelets: 307 10*3/uL (ref 150–400)
RBC: 4.17 MIL/uL — ABNORMAL LOW (ref 4.22–5.81)
RDW: 15.6 % — ABNORMAL HIGH (ref 11.5–15.5)
WBC: 6.1 10*3/uL (ref 4.0–10.5)
nRBC: 0 % (ref 0.0–0.2)

## 2020-09-29 LAB — BASIC METABOLIC PANEL
Anion gap: 8 (ref 5–15)
BUN: 15 mg/dL (ref 6–20)
CO2: 27 mmol/L (ref 22–32)
Calcium: 9.5 mg/dL (ref 8.9–10.3)
Chloride: 102 mmol/L (ref 98–111)
Creatinine, Ser: 0.65 mg/dL (ref 0.61–1.24)
GFR, Estimated: >60 mL/min (ref >60)
Glucose, Bld: 117 mg/dL — ABNORMAL HIGH (ref 70–99)
Potassium: 3.7 mmol/L (ref 3.5–5.1)
Sodium: 137 mmol/L (ref 135–145)

## 2020-09-29 MED ORDER — FUROSEMIDE 40 MG PO TABS: 40.0000 mg | ORAL_TABLET | Freq: Every day | ORAL | 0 refills | Status: DC

## 2020-09-29 NOTE — Care Management Important Message (Signed)
Important Message  Patient Details IM Letter given to the Patient. Name: Jonathan [name] MRN: [id number] Date of Birth: 12-16-1960   Medicare Important Message Given:  Yes     Kerin Salen 09/29/2020, 12:35 PM

## 2020-09-29 NOTE — Progress Notes (Signed)
Progress Note  Patient Name: Jonathan [name] Date of Encounter: 09/29/2020  [facility] Cardiologist: Minus Breeding, MD   Subjective   Complains of back/leg pains   Inpatient Medications    Scheduled Meds: . aspirin EC  81 mg Oral Daily  . atorvastatin  80 mg Oral Daily  . enoxaparin (LOVENOX) injection  40 mg Subcutaneous Q24H  . furosemide  40 mg Oral Daily  . gabapentin  300 mg Oral BID  . isosorbide mononitrate  60 mg Oral Daily  . leflunomide  10 mg Oral Daily  . pantoprazole  40 mg Oral Daily  . sodium chloride flush  3 mL Intravenous Q12H  . spironolactone  25 mg Oral Daily   Continuous Infusions: . sodium chloride     PRN Meds: sodium chloride, acetaminophen **OR** acetaminophen, hydrALAZINE, HYDROcodone-acetaminophen, morphine injection, ondansetron (ZOFRAN) IV, polyethylene glycol, sodium chloride flush   Vital Signs    Vitals:   09/28/20 1415 09/28/20 1600 09/28/20 2200 09/29/20 0508  BP: (!) 87/44 (!) 106/59 118/65 (!) 109/58  Pulse: 78 87 96 86  Resp: 14 18 18 20   Temp:  98.1 F (36.7 C) 98.7 F (37.1 C) 98.2 F (36.8 C)  TempSrc:  Oral Oral Oral  SpO2: 98% 100% 98% 94%  Weight:      Height:        Intake/Output Summary (Last 24 hours) at 09/29/2020 0829 Last data filed at 09/29/2020 4196 Gross per 24 hour  Intake --  Output 1310 ml  Net -1310 ml   Last 3 Weights 09/28/2020 09/27/2020 09/26/2020  Weight (lbs) 128 lb 15.5 oz 128 lb 15.5 oz 140 lb  Weight (kg) 58.5 kg 58.5 kg 63.504 kg      Telemetry    NSR 70-120s, NSVT x9 beats - Personally Reviewed  ECG    No new ECG - Personally Reviewed  Physical Exam   Chronically ill black male Lungs clear Speech impediment ? 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  Cath site RFA/RFV A no hematoma   Labs    High Sensitivity Troponin:   Recent Labs  Lab 09/07/20 0644 09/07/20 0844 09/26/20 0133 09/26/20 0405 09/26/20 1315  TROPONINIHS  20* 23* 107* 144* 84*      Chemistry Recent Labs  Lab 09/26/20 0133 09/26/20 0216 09/27/20 0138 09/28/20 0519 09/28/20 1051 09/29/20 0447  NA 137   < > 137 139 139  139 137  K 4.1   < > 3.5 4.1 3.7  3.7 3.7  CL 105   < > 103 102  --  102  CO2 23  --  28 29  --  27  GLUCOSE 124*   < > 157* 98  --  117*  BUN 8   < > 16 16  --  15  CREATININE 0.68   < > 0.60* 0.56*  --  0.65  CALCIUM 8.8*  --  9.4 9.7  --  9.5  PROT 6.9  --   --   --   --   --   ALBUMIN 2.9*  --   --   --   --   --   AST 34  --   --   --   --   --   ALT 12  --   --   --   --   --   ALKPHOS 75  --   --   --   --   --  BILITOT 1.3*  --   --   --   --   --   GFRNONAA >60  --  >60 >60  --  >60  ANIONGAP 9  --  6 8  --  8   < > = values in this interval not displayed.     Hematology Recent Labs  Lab 09/27/20 0138 09/28/20 0519 09/28/20 1051 09/29/20 0447  WBC 10.5 6.4  --  6.1  RBC 4.13* 3.92*  --  4.17*  HGB 10.0* 9.4* 9.9*  10.2* 10.2*  HCT 32.2* 31.2* 29.0*  30.0* 33.3*  MCV 78.0* 79.6*  --  79.9*  MCH 24.2* 24.0*  --  24.5*  MCHC 31.1 30.1  --  30.6  RDW 15.5 15.6*  --  15.6*  PLT 346 320  --  307    BNP Recent Labs  Lab 09/26/20 0133  BNP 488.9*     DDimer No results for input(s): DDIMER in the last 168 hours.   Radiology    CARDIAC CATHETERIZATION  Result Date: 09/28/2020 Conclusions: 1. Severe single-vessel coronary artery disease with chronic total occlusion of the mid RCA.  There is mild disease involving the left coronary artery.  Overall appearance is similar to 2019. 2. Mildly to moderately elevated left heart filling pressure. 3. Normal cardiac output/index. Recommendations: 1. Continue medical therapy of acute on chronic HFrEF and ischemic heart disease. 2. Transfer back to [facility] after completion of post-catheterization recovery for ongoing medical management. Nelva Bush, MD [facility]    Cardiac Studies   Echo 09/26/20: 1. Left ventricular ejection fraction,  by estimation, is 30 to 35%. The  left ventricle has moderately decreased function. The left ventricle  demonstrates regional wall motion abnormalities. Inferior/inferoseptal  akinesis. The left ventricular internal  cavity size was mildly dilated. Left ventricular diastolic parameters are  consistent with Grade II diastolic dysfunction (pseudonormalization).  Elevated left atrial pressure.  2. Right ventricular systolic function is normal. The right ventricular  size is normal. Tricuspid regurgitation signal is inadequate for assessing  PA pressure.  3. Left atrial size was mildly dilated.  4. The aortic valve was not well visualized. Aortic valve regurgitation  is mild to moderate. Eccentric posterior directed AI jet, visually appears  mild but may be underestimating given eccentric jet, moderate by PHT. No  aortic stenosis is present.  5. The inferior vena cava is normal in size with greater than 50%  respiratory variability, suggesting right atrial pressure of 3 mmHg.  6. The mitral valve is normal in structure. No evidence of mitral valve  regurgitation.   Patient Profile     60 y.o. male with a hx of CAD with known CTO RCA, RA, ischemic cardiomyopathy, venous insufficiency, chronic wounds who is being seen 09/26/2020 for the evaluation of heart failure   Assessment & Plan    1. CHF:  Systolic EF 94-80% known CTO RCA by cath 07/2019 Cath 09/28/20 no changes stable CAD and good filling pressures With preserved CO.  Continue current medical Rx Euvolemic on exam D/C heparin Continue statin for HLD  Ok to d/c home today will arrange outpatient f/u with Dr Percival Spanish     For questions or updates, please contact Jonathan [name] Please consult [email or web address] for contact info under        Signed, Jenkins Rouge, MD  09/29/2020, 8:29 AM

## 2020-09-29 NOTE — Progress Notes (Signed)
Pt to be discharged to home this afternoon. Pt given discharge instructions including all discharge Medications and schedules for these Medications. Pt verbalized understanding. AVS discharge packet with pt at time of discharge

## 2020-09-29 NOTE — Discharge Summary (Signed)
Physician Discharge Summary  Jonathan [name] WIO:[id number] DOB: [birth year] DOA: 09/26/2020  PCP: System, Provider Not In  Admit date: 09/26/2020 Discharge date: 09/29/2020  Discharge disposition: Home with home health therapy   Recommendations for Outpatient Follow-Up:   Follow-up with PCP in 1 week   Discharge Diagnosis:   Principal Problem:   CHF, acute on chronic ([place]) Active Problems:   Essential hypertension   HLD (hyperlipidemia)   Acute on chronic HFrEF (heart failure with reduced ejection fraction) (HCC)   Chronic pain syndrome   Rheumatoid arthritis (HCC)   Venous stasis ulcers (HCC)   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  Diet recommendation:  Diet Order            Diet - low sodium heart healthy           Diet Heart Room service appropriate? Yes; Fluid consistency: Thin  Diet effective now                   Code Status: Full Code     Hospital Course:   [name] is a 61 y.o. male with past medical history of RA, CAD, s/p bilateral great saphenous vein laser ablations, chronic wounds, ischemic cardiomyopathy, neuropathy secondary to RA, HFrEF, chronic pain, who presented to the hospital with chest pain and shortness of breath.  Reportedly, when EMS arrived, his heart rate was in the 140s and oxygen saturation was 80% on room air.  He was admitted to the hospital for acute hypoxic respiratory failure, chest pain and acute on chronic CHF.  He was treated with IV Lasix and oxygen.  He was successfully weaned off of oxygen to room air.  Cardiologist was consulted because of systolic dysfunction, chest pain and elevated troponins.  He underwent right and left heart cath  which showed severe single-vessel coronary artery disease with chronic total occlusion of the mid RCA and mild disease involving the left coronary artery.   There is also evidence of mildly to moderately elevated left heart filling pressure.  His condition has improved and is deemed stable for discharge to home today.     Medical Consultants:    Cardiologist   Discharge Exam:    Vitals:   09/28/20 1415 09/28/20 1600 09/28/20 2200 09/29/20 0508  BP: (!) 87/44 (!) 106/59 118/65 (!) 109/58  Pulse: 78 87 96 86  Resp: 14 18 18 20   Temp:  98.1 F (36.7 C) 98.7 F (37.1 C) 98.2 F (36.8 C)  TempSrc:  Oral Oral Oral  SpO2: 98% 100% 98% 94%  Weight:      Height:         GEN: NAD SKIN: Superficial wound inferior to the right medial malleolus.  Wound on the dorsal aspects of the left foot proximal to the second through fourth toes.  Hyperpigmented b/l legs and feet. EYES: EOMI  ENT: MMM CV: RRR PULM: CTA B ABD: soft, ND, NT, +BS CNS: AAO x 3, non focal EXT: No edema or tenderness   The results of significant diagnostics from this hospitalization (including imaging, microbiology, ancillary and laboratory) are listed below for reference.     Procedures and Diagnostic Studies:   CT Angio Chest PE W and/or Wo Contrast  Result Date: 09/26/2020 CLINICAL DATA:  Chest pain.  Effusion suspected. EXAM: CT ANGIOGRAPHY CHEST WITH CONTRAST TECHNIQUE: Multidetector CT imaging of the  chest was performed using the standard protocol during bolus administration of intravenous contrast. Multiplanar CT image reconstructions and MIPs were obtained to evaluate the vascular anatomy. CONTRAST:  66mL OMNIPAQUE IOHEXOL 350 MG/ML SOLN COMPARISON:  None. FINDINGS: Cardiovascular: Satisfactory opacification of the pulmonary arteries to the proximal segmental level. No evidence of pulmonary embolism. Limited evaluation at the subsegmental level due to respiratory motion artifact. Normal heart size. No pericardial effusion. The thoracic aorta is normal in caliber. Mild atherosclerotic plaque. Mediastinum/Nodes: Bilateral axillary lymphadenopathy. No enlarged  mediastinal or hilar lymph nodes. Thyroid gland, trachea, and esophagus demonstrate no significant findings. Lungs/Pleura: Slightly more consolidative airspace opacities at the lung bases. Diffuse, lower lobe predominant, peribronchovascular airspace opacities. Bilateral trace pleural effusions. No pneumothorax. Upper Abdomen: No acute abnormality. Musculoskeletal: No chest wall abnormality. Severe chronic degenerative changes of bilateral shoulders with cortical erosion and destruction of the femoral heads and acetabuli. Question underlying joint effusions. Review of the MIP images confirms the above findings. IMPRESSION: 1. No central or proximal segmental pulmonary embolus with limited evaluation more distally due to motion artifact. 2. Diffuse, lower lobe predominant, peribronchovascular airspace opacities. Bilateral trace pleural effusions. Findings could represent pulmonary edema versus infection/inflammation. 3. Severe chronic degenerative changes of bilateral shoulders with cortical erosion and destruction of the femoral heads and acetabuli. Question underlying joint effusions. 4. Bilateral axillary lymphadenopathy. Electronically Signed   By: Iven Finn M.D.   On: 09/26/2020 03:48   DG Chest Portable 1 View  Result Date: 09/26/2020 CLINICAL DATA:  Shortness of breath EXAM: PORTABLE CHEST 1 VIEW COMPARISON:  09/07/2020 FINDINGS: Moderate cardiomegaly. Bilateral basilar predominant interstitial opacities. No pleural effusion or pneumothorax. IMPRESSION: Cardiomegaly and bilateral basilar predominant interstitial opacities, likely pulmonary edema. Electronically Signed   By: Ulyses Jarred M.D.   On: 09/26/2020 03:13   ECHOCARDIOGRAM COMPLETE  Result Date: 09/26/2020    ECHOCARDIOGRAM REPORT   [name] Date of Exam: 09/26/2020 Medical Rec #:  [id number]        Height:       63.0 in Accession #:    [id number]       Weight:       140.0 lb Date of Birth:  Jan 26, 1961       BSA:           1.662 m Patient Age:    20 years         BP:           109/60 mmHg Patient Gender: M                HR:           93 bpm. Exam Location:  Inpatient Procedure: 2D Echo, Cardiac Doppler and Color Doppler Indications:    CHF I50.9  History:        Patient has prior history of Echocardiogram examinations, most                 recent 08/22/2019. Previous Myocardial Infarction and CAD.  Sonographer:    Merrie Roof RDCS Referring Phys: [id number] [place]  1. Left ventricular ejection fraction, by estimation, is 30 to 35%. The left ventricle has moderately decreased function. The left ventricle demonstrates regional wall motion abnormalities. Inferior/inferoseptal akinesis. The left ventricular internal cavity size was mildly dilated. Left ventricular diastolic parameters are consistent with Grade II diastolic dysfunction (pseudonormalization). Elevated left atrial pressure.  2. Right ventricular systolic function is normal. The right ventricular size is normal. Tricuspid  regurgitation signal is inadequate for assessing PA pressure.  3. Left atrial size was mildly dilated.  4. The aortic valve was not well visualized. Aortic valve regurgitation is mild to moderate. Eccentric posterior directed AI jet, visually appears mild but may be underestimating given eccentric jet, moderate by PHT. No aortic stenosis is present.  5. The inferior vena cava is normal in size with greater than 50% respiratory variability, suggesting right atrial pressure of 3 mmHg.  6. The mitral valve is normal in structure. No evidence of mitral valve regurgitation. FINDINGS  Left Ventricle: Left ventricular ejection fraction, by estimation, is 30 to 35%. The left ventricle has moderately decreased function. The left ventricle demonstrates regional wall motion abnormalities. The left ventricular internal cavity size was mildly dilated. There is no left ventricular hypertrophy. Left ventricular diastolic parameters are consistent with  Grade II diastolic dysfunction (pseudonormalization). Elevated left atrial pressure. Right Ventricle: The right ventricular size is normal. No increase in right ventricular wall thickness. Right ventricular systolic function is normal. Tricuspid regurgitation signal is inadequate for assessing PA pressure. Left Atrium: Left atrial size was mildly dilated. Right Atrium: Right atrial size was normal in size. Pericardium: Trivial pericardial effusion is present. Mitral Valve: The mitral valve is normal in structure. No evidence of mitral valve regurgitation. Tricuspid Valve: The tricuspid valve is normal in structure. Tricuspid valve regurgitation is trivial. Aortic Valve: The aortic valve was not well visualized. Aortic valve regurgitation is mild to moderate. No aortic stenosis is present. Pulmonic Valve: The pulmonic valve was not well visualized. Pulmonic valve regurgitation is not visualized. Aorta: The aortic root is normal in size and structure. Venous: The inferior vena cava is normal in size with greater than 50% respiratory variability, suggesting right atrial pressure of 3 mmHg. IAS/Shunts: The interatrial septum was not well visualized.  LEFT VENTRICLE PLAX 2D LVIDd:         5.90 cm      Diastology LVIDs:         4.80 cm      LV e' medial:    5.33 cm/s LV PW:         0.80 cm      LV E/e' medial:  16.3 LV IVS:        0.60 cm      LV e' lateral:   5.77 cm/s LVOT diam:     2.10 cm      LV E/e' lateral: 15.1 LV SV:         58 LV SV Index:   35 LVOT Area:     3.46 cm  LV Volumes (MOD) LV vol d, MOD A2C: 197.0 ml LV vol d, MOD A4C: 205.0 ml LV vol s, MOD A2C: 147.0 ml LV vol s, MOD A4C: 120.5 ml LV SV MOD A2C:     50.0 ml LV SV MOD A4C:     205.0 ml LV SV MOD BP:      67.1 ml RIGHT VENTRICLE RV Basal diam:  3.70 cm LEFT ATRIUM             Index       RIGHT ATRIUM           Index LA diam:        3.70 cm 2.23 cm/m  RA Area:     18.10 cm LA Vol (A2C):   77.3 ml 46.52 ml/m RA Volume:   46.90 ml  28.22 ml/m LA  Vol (A4C):   46.6 ml 28.04 ml/m  LA Biplane Vol: 62.2 ml 37.43 ml/m  AORTIC VALVE LVOT Vmax:   88.80 cm/s LVOT Vmean:  56.500 cm/s LVOT VTI:    0.168 m  AORTA Ao Root diam: 3.10 cm MITRAL VALVE MV Area (PHT): 6.12 cm    SHUNTS MV Decel Time: 124 msec    Systemic VTI:  0.17 m MV E velocity: 87.00 cm/s  Systemic Diam: 2.10 cm MV A velocity: 94.70 cm/s MV E/A ratio:  0.92 Oswaldo Milian MD Electronically signed by Oswaldo Milian MD Signature Date/Time: 09/26/2020/2:24:59 PM    Final      Labs:   Basic Metabolic Panel: Recent Labs  Lab 09/26/20 0133 09/26/20 0216 09/26/20 0405 09/27/20 0138 09/27/20 0853 09/28/20 0519 09/28/20 1051 09/29/20 0447  NA 137 139  --  137  --  139 139  139 137  K 4.1 3.2*  --  3.5  --  4.1 3.7  3.7 3.7  CL 105 104  --  103  --  102  --  102  CO2 23  --   --  28  --  29  --  27  GLUCOSE 124* 131*  --  157*  --  98  --  117*  BUN 8 7  --  16  --  16  --  15  CREATININE 0.68 0.50*  --  0.60*  --  0.56*  --  0.65  CALCIUM 8.8*  --   --  9.4  --  9.7  --  9.5  MG  --   --  1.7  --  2.0  --   --   --    GFR Estimated Creatinine Clearance: 80 mL/min (by C-G formula based on SCr of 0.65 mg/dL). Liver Function Tests: Recent Labs  Lab 09/26/20 0133  AST 34  ALT 12  ALKPHOS 75  BILITOT 1.3*  PROT 6.9  ALBUMIN 2.9*   No results for input(s): LIPASE, AMYLASE in the last 168 hours. No results for input(s): AMMONIA in the last 168 hours. Coagulation profile No results for input(s): INR, PROTIME in the last 168 hours.  CBC: Recent Labs  Lab 09/26/20 0133 09/26/20 0216 09/27/20 0138 09/28/20 0519 09/28/20 1051 09/29/20 0447  WBC 8.3  --  10.5 6.4  --  6.1  NEUTROABS 6.8  --   --   --   --   --   HGB 9.8* 9.9* 10.0* 9.4* 9.9*  10.2* 10.2*  HCT 31.5* 29.0* 32.2* 31.2* 29.0*  30.0* 33.3*  MCV 78.4*  --  78.0* 79.6*  --  79.9*  PLT 321  --  346 320  --  307   Cardiac Enzymes: No results for input(s): CKTOTAL, CKMB, CKMBINDEX, TROPONINI  in the last 168 hours. BNP: Invalid input(s): POCBNP CBG: No results for input(s): GLUCAP in the last 168 hours. D-Dimer No results for input(s): DDIMER in the last 72 hours. Hgb A1c No results for input(s): HGBA1C in the last 72 hours. Lipid Profile Recent Labs    09/27/20 0138  CHOL 202*  HDL 56  LDLCALC 128*  TRIG 88  CHOLHDL 3.6   Thyroid function studies No results for input(s): TSH, T4TOTAL, T3FREE, THYROIDAB in the last 72 hours.  Invalid input(s): FREET3 Anemia work up No results for input(s): VITAMINB12, FOLATE, FERRITIN, TIBC, IRON, RETICCTPCT in the last 72 hours. Microbiology Recent Results (from the past 240 hour(s))  Resp Panel by RT-PCR (Flu A&B, Covid) Nasopharyngeal Swab     Status: None   Collection  Time: 09/26/20  1:34 AM   Specimen: Nasopharyngeal Swab; Nasopharyngeal(NP) swabs in vial transport medium  Result Value Ref Range Status   SARS Coronavirus 2 by RT PCR NEGATIVE NEGATIVE Final    Comment: (NOTE) SARS-CoV-2 target nucleic acids are NOT DETECTED.  The SARS-CoV-2 RNA is generally detectable in upper respiratory specimens during the acute phase of infection. The lowest concentration of SARS-CoV-2 viral copies this assay can detect is 138 copies/mL. A negative result does not preclude SARS-Cov-2 infection and should not be used as the sole basis for treatment or other patient management decisions. A negative result may occur with  improper specimen collection/handling, submission of specimen other than nasopharyngeal swab, presence of viral mutation(s) within the areas targeted by this assay, and inadequate number of viral copies(<138 copies/mL). A negative result must be combined with clinical observations, patient history, and epidemiological information. The expected result is Negative.  Fact Sheet for Patients:  EntrepreneurPulse.com.au  Fact Sheet for Healthcare Providers:   IncredibleEmployment.be  This test is no t yet approved or cleared by the [place] FDA and  has been authorized for detection and/or diagnosis of SARS-CoV-2 by FDA under an Emergency Use Authorization (EUA). This EUA will remain  in effect (meaning this test can be used) for the duration of the COVID-19 declaration under Section 564(b)(1) of the Act, 21 U.S.C.section 360bbb-3(b)(1), unless the authorization is terminated  or revoked sooner.       Influenza A by PCR NEGATIVE NEGATIVE Final   Influenza B by PCR NEGATIVE NEGATIVE Final    Comment: (NOTE) The Xpert Xpress SARS-CoV-2/FLU/RSV plus assay is intended as an aid in the diagnosis of influenza from Nasopharyngeal swab specimens and should not be used as a sole basis for treatment. Nasal washings and aspirates are unacceptable for Xpert Xpress SARS-CoV-2/FLU/RSV testing.  Fact Sheet for Patients: EntrepreneurPulse.com.au  Fact Sheet for Healthcare Providers: IncredibleEmployment.be  This test is not yet approved or cleared by the [place] FDA and has been authorized for detection and/or diagnosis of SARS-CoV-2 by FDA under an Emergency Use Authorization (EUA). This EUA will remain in effect (meaning this test can be used) for the duration of the COVID-19 declaration under Section 564(b)(1) of the Act, 21 U.S.C. section 360bbb-3(b)(1), unless the authorization is terminated or revoked.  Performed at [facility], [place] [address], [place], [place] [address]   Surgical pcr screen     Status: None   Collection Time: 09/28/20  3:40 AM   Specimen: Nasal Mucosa; Nasal Swab  Result Value Ref Range Status   MRSA, PCR NEGATIVE NEGATIVE Final   Staphylococcus aureus NEGATIVE NEGATIVE Final    Comment: (NOTE) The Xpert SA Assay (FDA approved for NASAL specimens in patients 69 years of age and older), is one component of a  comprehensive surveillance program. It is not intended to diagnose infection nor to guide or monitor treatment. Performed at [facility], [place] [address], [place], [place] [address]      Discharge Instructions:   Discharge Instructions    Diet - low sodium heart healthy   Complete by: As directed    Discharge wound care:   Complete by: As directed    Place a small piece of Aquacel Ag+ Advantage in the wound and secure with small foam dressing. Biweekly una boot to be applied on Monday and Thurs. Patient should continue with biweekly una boot change with HH and FU with Dr. Delora Fuel in 4 weeks as planned.   Discharge  wound care:   Complete by: As directed    Place a small piece of Aquacel Ag+ Advantage in the wound and secure with small foam dressing. Biweekly una boot to be applied on Monday and Thurs. Patient should continue with biweekly una boot change with HH and FU with Dr. Delora Fuel in 4 weeks as planned.   Face-to-face encounter (required for Medicare/Medicaid patients)   Complete by: As directed    I Sharief Wainwright certify that this patient is under my care and that I, or a nurse practitioner or physician's assistant working with me, had a face-to-face encounter that meets the physician face-to-face encounter requirements with this patient on 09/29/2020. The encounter with the patient was in whole, or in part for the following medical condition(s) which is the primary reason for home health care (List medical condition): CHF, lower extremity wounds   The encounter with the patient was in whole, or in part, for the following medical condition, which is the primary reason for home health care: CHF, lower extremity wounds   I certify that, based on my findings, the following services are medically necessary home health services: Nursing   Reason for Medically Necessary Home Health Services:  Skilled Nursing- Skilled Assessment/Observation Skilled Nursing- Assessment and  Observation of Wound Status     My clinical findings support the need for the above services: Shortness of breath with activity   Further, I certify that my clinical findings support that this patient is homebound due to: Shortness of Breath with activity   Home Health   Complete by: As directed    Place a small piece of Aquacel Ag+ Advantage in the wound and secure with small foam dressing. Biweekly una boot to be applied on Monday and Thurs. Patient should continue with biweekly una boot change with HH and FU with Dr. Delora Fuel in 4 weeks as planned.   To provide the following care/treatments: RN   Increase activity slowly   Complete by: As directed    Increase activity slowly   Complete by: As directed      Allergies as of 09/29/2020      Reactions   Infliximab Nausea Only, Shortness Of Breath      Medication List    STOP taking these medications   celecoxib 200 MG capsule Commonly known as: CELEBREX     TAKE these medications   acetaminophen 325 MG tablet Commonly known as: TYLENOL Take 2 tablets (650 mg total) by mouth every 4 (four) hours as needed for headache or mild pain.   aspirin EC 81 MG tablet Take 1 tablet (81 mg total) by mouth daily.   atorvastatin 40 MG tablet Commonly known as: LIPITOR Take 40 mg by mouth daily.   cetirizine 10 MG tablet Commonly known as: ZYRTEC Take 10 mg by mouth daily as needed.   docusate sodium 100 MG capsule Commonly known as: COLACE Take 1 capsule (100 mg total) by mouth 2 (two) times daily. What changed:   when to take this  reasons to take this   fluticasone 50 MCG/ACT nasal spray Commonly known as: FLONASE Place 1 spray into the nose daily as needed for allergies.   furosemide 40 MG tablet Commonly known as: LASIX Take 1 tablet (40 mg total) by mouth daily. Start taking on: September 30, 2020   gabapentin 300 MG capsule Commonly known as: NEURONTIN Take 300 mg by mouth 2 (two) times daily.   isosorbide mononitrate 60 MG  24 hr tablet Commonly known as:  IMDUR Take 1 tablet (60 mg total) by mouth daily.   leflunomide 10 MG tablet Commonly known as: ARAVA Take 1 tablet by mouth daily.   lisinopril 5 MG tablet Commonly known as: ZESTRIL Take 1 tablet by mouth daily.   meloxicam 7.5 MG tablet Commonly known as: MOBIC Take 7.5 mg by mouth daily.   metoprolol tartrate 100 MG tablet Commonly known as: LOPRESSOR Take 100 mg by mouth daily.   neomycin-polymyxin-hydrocortisone 3.5-10000-1 OTIC suspension Commonly known as: CORTISPORIN 4 drops 2 (two) times daily.   omeprazole 20 MG capsule Commonly known as: PRILOSEC Take 20 mg by mouth daily.   spironolactone 25 MG tablet Commonly known as: ALDACTONE Take 1 tablet by mouth daily.   triamcinolone cream 0.1 % Commonly known as: KENALOG Apply daily to flaky skin as needed            Discharge Care Instructions  (From admission, onward)         Start     Ordered   09/29/20 0000  Discharge wound care:       Comments: Place a small piece of Aquacel Ag+ Advantage in the wound and secure with small foam dressing. Biweekly una boot to be applied on Monday and Thurs. Patient should continue with biweekly una boot change with HH and FU with Dr. Delora Fuel in 4 weeks as planned.   09/29/20 1012   09/29/20 0000  Discharge wound care:       Comments: Place a small piece of Aquacel Ag+ Advantage in the wound and secure with small foam dressing. Biweekly una boot to be applied on Monday and Thurs. Patient should continue with biweekly una boot change with HH and FU with Dr. Delora Fuel in 4 weeks as planned.   09/29/20 1012            Time coordinating discharge: 32 minutes  Signed:  Virga Haltiwanger  [facility] 09/29/2020, 10:12 AM   Pager on [email or web address]. If 7PM-7AM, please contact night-coverage at [email or web address]

## 2020-10-05 ENCOUNTER — Ambulatory Visit (INDEPENDENT_AMBULATORY_CARE_PROVIDER_SITE_OTHER): Payer: Medicare Other | Admitting: Podiatry

## 2020-10-05 ENCOUNTER — Other Ambulatory Visit: Payer: Self-pay

## 2020-10-05 ENCOUNTER — Encounter: Payer: Self-pay | Admitting: Podiatry

## 2020-10-05 DIAGNOSIS — M79675 Pain in left toe(s): Secondary | ICD-10-CM | POA: Diagnosis not present

## 2020-10-05 DIAGNOSIS — I872 Venous insufficiency (chronic) (peripheral): Secondary | ICD-10-CM

## 2020-10-05 DIAGNOSIS — M2042 Other hammer toe(s) (acquired), left foot: Secondary | ICD-10-CM

## 2020-10-05 DIAGNOSIS — M79674 Pain in right toe(s): Secondary | ICD-10-CM

## 2020-10-05 DIAGNOSIS — M2141 Flat foot [pes planus] (acquired), right foot: Secondary | ICD-10-CM

## 2020-10-05 DIAGNOSIS — M2041 Other hammer toe(s) (acquired), right foot: Secondary | ICD-10-CM

## 2020-10-05 DIAGNOSIS — M2142 Flat foot [pes planus] (acquired), left foot: Secondary | ICD-10-CM

## 2020-10-05 DIAGNOSIS — B351 Tinea unguium: Secondary | ICD-10-CM

## 2020-10-05 NOTE — Progress Notes (Signed)
This patient returns to my office for at risk foot care.  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    This patient is unable to cut nails himself since the patient cannot reach his nails.These nails are painful walking and wearing shoes.   Patient has an ulcer top of left foot which is bandaged with unna boot. This patient presents for at risk foot care today.  General Appearance  Alert, conversant and in no acute stress.  Vascular  Dorsalis pedis and posterior tibial  pulses are weakly  palpable  bilaterally.  Capillary return is within normal limits  bilaterally. Temperature is within normal limits  Bilaterally.  Chronic edema  Legs  B/L.  Neurologic  Senn-Weinstein monofilament wire test within normal limits  bilaterally. Muscle power within normal limits bilaterally.  Nails Thick disfigured discolored nails with subungual debris  from hallux to fifth toes bilaterally. No evidence of bacterial infection or drainage bilaterally.  Orthopedic  No limitations of motion  feet .  No crepitus or effusions noted.  No bony pathology or digital deformities noted.  HAV with hammer toes.    Skin  normotropic skin with no porokeratosis noted bilaterally.  Bandaged wound left foot. No redness or swelling noted.  No drainage.  Dry scaly skin feet  B/L.  Onychomycosis  Pain in right toes  Pain in left toes   Consent was obtained for treatment procedures.   Mechanical debridement of nails 1-5  bilaterally performed with a nail nipper.  Filed with dremel without incident.     Return office visit    4 months for nail care.                 Told patient to return for periodic foot care and evaluation due to potential at risk complications.   Gardiner Barefoot DPM

## 2020-10-06 ENCOUNTER — Telehealth (HOSPITAL_COMMUNITY): Payer: Self-pay | Admitting: Surgery

## 2020-10-06 NOTE — Telephone Encounter (Signed)
I called patient to remind him of [facility] appt. Scheduled for tomorrow.  He tells me that he is unable to come and needs to reschedule.  I rescheduled his appt for Friday 24th.

## 2020-10-07 ENCOUNTER — Encounter (HOSPITAL_COMMUNITY): Payer: Medicare Other

## 2020-10-07 DIAGNOSIS — J9601 Acute respiratory failure with hypoxia: Secondary | ICD-10-CM | POA: Insufficient documentation

## 2020-10-09 ENCOUNTER — Other Ambulatory Visit: Payer: Self-pay

## 2020-10-09 ENCOUNTER — Encounter (HOSPITAL_COMMUNITY): Payer: Self-pay

## 2020-10-09 ENCOUNTER — Emergency Department (HOSPITAL_COMMUNITY): Payer: Medicare Other

## 2020-10-09 ENCOUNTER — Inpatient Hospital Stay (HOSPITAL_COMMUNITY): Payer: Medicare Other

## 2020-10-09 ENCOUNTER — Inpatient Hospital Stay (HOSPITAL_COMMUNITY)
Admission: EM | Admit: 2020-10-09 | Discharge: 2020-10-19 | DRG: 463 | Disposition: A | Discharge status: Rehabilitation Facility | Payer: Medicare Other | Attending: Internal Medicine | Admitting: Internal Medicine

## 2020-10-09 ENCOUNTER — Ambulatory Visit (HOSPITAL_COMMUNITY)
Admission: EM | Admit: 2020-10-09 | Discharge: 2020-10-09 | Disposition: A | Discharge status: Home / Self Care | Payer: Medicare Other | Attending: Internal Medicine | Admitting: Internal Medicine

## 2020-10-09 DIAGNOSIS — Z7952 Long term (current) use of systemic steroids: Secondary | ICD-10-CM

## 2020-10-09 DIAGNOSIS — D638 Anemia in other chronic diseases classified elsewhere: Secondary | ICD-10-CM | POA: Diagnosis present

## 2020-10-09 DIAGNOSIS — I10 Essential (primary) hypertension: Secondary | ICD-10-CM | POA: Diagnosis not present

## 2020-10-09 DIAGNOSIS — I5042 Chronic combined systolic (congestive) and diastolic (congestive) heart failure: Secondary | ICD-10-CM | POA: Diagnosis not present

## 2020-10-09 DIAGNOSIS — E876 Hypokalemia: Secondary | ICD-10-CM | POA: Diagnosis not present

## 2020-10-09 DIAGNOSIS — G894 Chronic pain syndrome: Secondary | ICD-10-CM | POA: Diagnosis not present

## 2020-10-09 DIAGNOSIS — I63412 Cerebral infarction due to embolism of left middle cerebral artery: Secondary | ICD-10-CM | POA: Diagnosis not present

## 2020-10-09 DIAGNOSIS — R6250 Unspecified lack of expected normal physiological development in childhood: Secondary | ICD-10-CM | POA: Diagnosis present

## 2020-10-09 DIAGNOSIS — I878 Other specified disorders of veins: Secondary | ICD-10-CM | POA: Diagnosis present

## 2020-10-09 DIAGNOSIS — I493 Ventricular premature depolarization: Secondary | ICD-10-CM | POA: Diagnosis not present

## 2020-10-09 DIAGNOSIS — S91302A Unspecified open wound, left foot, initial encounter: Secondary | ICD-10-CM | POA: Diagnosis not present

## 2020-10-09 DIAGNOSIS — J95821 Acute postprocedural respiratory failure: Secondary | ICD-10-CM | POA: Diagnosis not present

## 2020-10-09 DIAGNOSIS — E785 Hyperlipidemia, unspecified: Secondary | ICD-10-CM | POA: Diagnosis present

## 2020-10-09 DIAGNOSIS — I255 Ischemic cardiomyopathy: Secondary | ICD-10-CM

## 2020-10-09 DIAGNOSIS — I2510 Atherosclerotic heart disease of native coronary artery without angina pectoris: Secondary | ICD-10-CM | POA: Diagnosis present

## 2020-10-09 DIAGNOSIS — M1990 Unspecified osteoarthritis, unspecified site: Secondary | ICD-10-CM | POA: Diagnosis present

## 2020-10-09 DIAGNOSIS — I6389 Other cerebral infarction: Secondary | ICD-10-CM | POA: Diagnosis not present

## 2020-10-09 DIAGNOSIS — M19172 Post-traumatic osteoarthritis, left ankle and foot: Secondary | ICD-10-CM | POA: Diagnosis present

## 2020-10-09 DIAGNOSIS — Z791 Long term (current) use of non-steroidal anti-inflammatories (NSAID): Secondary | ICD-10-CM

## 2020-10-09 DIAGNOSIS — M86672 Other chronic osteomyelitis, left ankle and foot: Secondary | ICD-10-CM | POA: Diagnosis not present

## 2020-10-09 DIAGNOSIS — I472 Ventricular tachycardia: Secondary | ICD-10-CM | POA: Diagnosis not present

## 2020-10-09 DIAGNOSIS — I420 Dilated cardiomyopathy: Secondary | ICD-10-CM | POA: Diagnosis present

## 2020-10-09 DIAGNOSIS — I639 Cerebral infarction, unspecified: Secondary | ICD-10-CM | POA: Diagnosis not present

## 2020-10-09 DIAGNOSIS — G629 Polyneuropathy, unspecified: Secondary | ICD-10-CM | POA: Diagnosis present

## 2020-10-09 DIAGNOSIS — B351 Tinea unguium: Secondary | ICD-10-CM | POA: Diagnosis present

## 2020-10-09 DIAGNOSIS — M86172 Other acute osteomyelitis, left ankle and foot: Secondary | ICD-10-CM | POA: Diagnosis present

## 2020-10-09 DIAGNOSIS — M86179 Other acute osteomyelitis, unspecified ankle and foot: Secondary | ICD-10-CM | POA: Diagnosis present

## 2020-10-09 DIAGNOSIS — Z79899 Other long term (current) drug therapy: Secondary | ICD-10-CM

## 2020-10-09 DIAGNOSIS — D849 Immunodeficiency, unspecified: Secondary | ICD-10-CM | POA: Diagnosis present

## 2020-10-09 DIAGNOSIS — R54 Age-related physical debility: Secondary | ICD-10-CM | POA: Diagnosis present

## 2020-10-09 DIAGNOSIS — R29726 NIHSS score 26: Secondary | ICD-10-CM | POA: Diagnosis present

## 2020-10-09 DIAGNOSIS — Z87891 Personal history of nicotine dependence: Secondary | ICD-10-CM

## 2020-10-09 DIAGNOSIS — E871 Hypo-osmolality and hyponatremia: Secondary | ICD-10-CM | POA: Diagnosis not present

## 2020-10-09 DIAGNOSIS — R5381 Other malaise: Secondary | ICD-10-CM | POA: Diagnosis not present

## 2020-10-09 DIAGNOSIS — I63512 Cerebral infarction due to unspecified occlusion or stenosis of left middle cerebral artery: Secondary | ICD-10-CM

## 2020-10-09 DIAGNOSIS — I83009 Varicose veins of unspecified lower extremity with ulcer of unspecified site: Secondary | ICD-10-CM | POA: Diagnosis present

## 2020-10-09 DIAGNOSIS — Z7901 Long term (current) use of anticoagulants: Secondary | ICD-10-CM

## 2020-10-09 DIAGNOSIS — M069 Rheumatoid arthritis, unspecified: Secondary | ICD-10-CM | POA: Diagnosis not present

## 2020-10-09 DIAGNOSIS — E873 Alkalosis: Secondary | ICD-10-CM | POA: Diagnosis not present

## 2020-10-09 DIAGNOSIS — R4701 Aphasia: Secondary | ICD-10-CM | POA: Diagnosis not present

## 2020-10-09 DIAGNOSIS — M0579 Rheumatoid arthritis with rheumatoid factor of multiple sites without organ or systems involvement: Secondary | ICD-10-CM | POA: Diagnosis present

## 2020-10-09 DIAGNOSIS — L97529 Non-pressure chronic ulcer of other part of left foot with unspecified severity: Secondary | ICD-10-CM | POA: Diagnosis present

## 2020-10-09 DIAGNOSIS — J9601 Acute respiratory failure with hypoxia: Secondary | ICD-10-CM | POA: Diagnosis not present

## 2020-10-09 DIAGNOSIS — G8101 Flaccid hemiplegia affecting right dominant side: Secondary | ICD-10-CM | POA: Diagnosis not present

## 2020-10-09 DIAGNOSIS — I872 Venous insufficiency (chronic) (peripheral): Secondary | ICD-10-CM | POA: Diagnosis not present

## 2020-10-09 DIAGNOSIS — Z20822 Contact with and (suspected) exposure to covid-19: Secondary | ICD-10-CM | POA: Diagnosis present

## 2020-10-09 DIAGNOSIS — E7800 Pure hypercholesterolemia, unspecified: Secondary | ICD-10-CM | POA: Diagnosis not present

## 2020-10-09 DIAGNOSIS — L0390 Cellulitis, unspecified: Secondary | ICD-10-CM

## 2020-10-09 DIAGNOSIS — I5043 Acute on chronic combined systolic (congestive) and diastolic (congestive) heart failure: Secondary | ICD-10-CM | POA: Diagnosis present

## 2020-10-09 DIAGNOSIS — R9431 Abnormal electrocardiogram [ECG] [EKG]: Secondary | ICD-10-CM | POA: Diagnosis not present

## 2020-10-09 DIAGNOSIS — R29810 Facial weakness: Secondary | ICD-10-CM | POA: Diagnosis not present

## 2020-10-09 DIAGNOSIS — Z8249 Family history of ischemic heart disease and other diseases of the circulatory system: Secondary | ICD-10-CM

## 2020-10-09 DIAGNOSIS — I70262 Atherosclerosis of native arteries of extremities with gangrene, left leg: Secondary | ICD-10-CM | POA: Diagnosis not present

## 2020-10-09 DIAGNOSIS — I5022 Chronic systolic (congestive) heart failure: Secondary | ICD-10-CM | POA: Diagnosis not present

## 2020-10-09 DIAGNOSIS — I2582 Chronic total occlusion of coronary artery: Secondary | ICD-10-CM | POA: Diagnosis present

## 2020-10-09 DIAGNOSIS — Z978 Presence of other specified devices: Secondary | ICD-10-CM

## 2020-10-09 DIAGNOSIS — B964 Proteus (mirabilis) (morganii) as the cause of diseases classified elsewhere: Secondary | ICD-10-CM | POA: Diagnosis present

## 2020-10-09 DIAGNOSIS — Z96643 Presence of artificial hip joint, bilateral: Secondary | ICD-10-CM | POA: Diagnosis present

## 2020-10-09 DIAGNOSIS — Z9911 Dependence on respirator [ventilator] status: Secondary | ICD-10-CM | POA: Diagnosis not present

## 2020-10-09 DIAGNOSIS — M86072 Acute hematogenous osteomyelitis, left ankle and foot: Secondary | ICD-10-CM | POA: Diagnosis not present

## 2020-10-09 DIAGNOSIS — L089 Local infection of the skin and subcutaneous tissue, unspecified: Secondary | ICD-10-CM | POA: Diagnosis not present

## 2020-10-09 DIAGNOSIS — L97522 Non-pressure chronic ulcer of other part of left foot with fat layer exposed: Secondary | ICD-10-CM | POA: Diagnosis not present

## 2020-10-09 DIAGNOSIS — I110 Hypertensive heart disease with heart failure: Secondary | ICD-10-CM | POA: Diagnosis present

## 2020-10-09 DIAGNOSIS — Z4659 Encounter for fitting and adjustment of other gastrointestinal appliance and device: Secondary | ICD-10-CM

## 2020-10-09 DIAGNOSIS — F840 Autistic disorder: Secondary | ICD-10-CM | POA: Diagnosis present

## 2020-10-09 DIAGNOSIS — L03116 Cellulitis of left lower limb: Secondary | ICD-10-CM | POA: Diagnosis present

## 2020-10-09 DIAGNOSIS — L97422 Non-pressure chronic ulcer of left heel and midfoot with fat layer exposed: Secondary | ICD-10-CM | POA: Diagnosis present

## 2020-10-09 DIAGNOSIS — Z888 Allergy status to other drugs, medicaments and biological substances status: Secondary | ICD-10-CM

## 2020-10-09 DIAGNOSIS — Z809 Family history of malignant neoplasm, unspecified: Secondary | ICD-10-CM

## 2020-10-09 DIAGNOSIS — I25118 Atherosclerotic heart disease of native coronary artery with other forms of angina pectoris: Secondary | ICD-10-CM | POA: Diagnosis not present

## 2020-10-09 DIAGNOSIS — Z7982 Long term (current) use of aspirin: Secondary | ICD-10-CM

## 2020-10-09 DIAGNOSIS — I252 Old myocardial infarction: Secondary | ICD-10-CM

## 2020-10-09 LAB — BASIC METABOLIC PANEL
Anion gap: 7 (ref 5–15)
BUN: 12 mg/dL (ref 6–20)
CO2: 25 mmol/L (ref 22–32)
Calcium: 9.8 mg/dL (ref 8.9–10.3)
Chloride: 109 mmol/L (ref 98–111)
Creatinine, Ser: 0.67 mg/dL (ref 0.61–1.24)
GFR, Estimated: >60 mL/min (ref >60)
Glucose, Bld: 91 mg/dL (ref 70–99)
Potassium: 3.7 mmol/L (ref 3.5–5.1)
Sodium: 141 mmol/L (ref 135–145)

## 2020-10-09 LAB — CBC WITH DIFFERENTIAL/PLATELET
Abs Immature Granulocytes: 0.02 10*3/uL (ref 0.00–0.07)
Basophils Absolute: 0.1 10*3/uL (ref 0.0–0.1)
Basophils Relative: 1 %
Eosinophils Absolute: 0.7 10*3/uL — ABNORMAL HIGH (ref 0.0–0.5)
Eosinophils Relative: 12 %
HCT: 28.9 % — ABNORMAL LOW (ref 39.0–52.0)
Hemoglobin: 8.8 g/dL — ABNORMAL LOW (ref 13.0–17.0)
Immature Granulocytes: 0 %
Lymphocytes Relative: 17 %
Lymphs Abs: 1.1 10*3/uL (ref 0.7–4.0)
MCH: 24.6 pg — ABNORMAL LOW (ref 26.0–34.0)
MCHC: 30.4 g/dL (ref 30.0–36.0)
MCV: 81 fL (ref 80.0–100.0)
Monocytes Absolute: 0.6 10*3/uL (ref 0.1–1.0)
Monocytes Relative: 10 %
Neutro Abs: 3.6 10*3/uL (ref 1.7–7.7)
Neutrophils Relative %: 60 %
Platelets: 293 10*3/uL (ref 150–400)
RBC: 3.57 MIL/uL — ABNORMAL LOW (ref 4.22–5.81)
RDW: 15.8 % — ABNORMAL HIGH (ref 11.5–15.5)
WBC: 6 10*3/uL (ref 4.0–10.5)
nRBC: 0 % (ref 0.0–0.2)

## 2020-10-09 MED ORDER — PIPERACILLIN-TAZOBACTAM 3.375 G IVPB 30 MIN
3.3750 g | Freq: Once | INTRAVENOUS | Status: AC
  Administered 2020-10-09: 3.375 g via INTRAVENOUS
  Filled 2020-10-09: qty 50

## 2020-10-09 MED ORDER — VANCOMYCIN HCL IN DEXTROSE 1-5 GM/200ML-% IV SOLN
1000.0000 mg | Freq: Once | INTRAVENOUS | Status: AC
  Administered 2020-10-09: 1000 mg via INTRAVENOUS
  Filled 2020-10-09: qty 200

## 2020-10-09 MED ORDER — PIPERACILLIN-TAZOBACTAM 3.375 G IVPB
3.3750 g | Freq: Three times a day (TID) | INTRAVENOUS | Status: DC
  Administered 2020-10-10 – 2020-10-15 (×15): 3.375 g via INTRAVENOUS
  Filled 2020-10-09 (×17): qty 50

## 2020-10-09 MED ORDER — VANCOMYCIN HCL 1750 MG/350ML IV SOLN
1750.0000 mg | INTRAVENOUS | Status: DC
  Administered 2020-10-10 – 2020-10-15 (×5): 1750 mg via INTRAVENOUS
  Filled 2020-10-09 (×6): qty 350

## 2020-10-09 MED ORDER — SPIRONOLACTONE 25 MG PO TABS
25.0000 mg | ORAL_TABLET | Freq: Every day | ORAL | Status: DC
  Administered 2020-10-10 – 2020-10-12 (×2): 25 mg via ORAL
  Filled 2020-10-09 (×4): qty 1

## 2020-10-09 MED ORDER — ISOSORBIDE MONONITRATE ER 30 MG PO TB24
60.0000 mg | ORAL_TABLET | Freq: Every day | ORAL | Status: DC
  Administered 2020-10-10 – 2020-10-14 (×4): 60 mg via ORAL
  Filled 2020-10-09 (×4): qty 1

## 2020-10-09 MED ORDER — METOPROLOL SUCCINATE ER 50 MG PO TB24
100.0000 mg | ORAL_TABLET | Freq: Every day | ORAL | Status: DC
  Administered 2020-10-10 – 2020-10-12 (×2): 100 mg via ORAL
  Filled 2020-10-09 (×3): qty 2

## 2020-10-09 MED ORDER — LISINOPRIL 5 MG PO TABS
5.0000 mg | ORAL_TABLET | Freq: Every day | ORAL | Status: DC
  Administered 2020-10-10 – 2020-10-12 (×2): 5 mg via ORAL
  Filled 2020-10-09 (×3): qty 1

## 2020-10-09 MED ORDER — FUROSEMIDE 40 MG PO TABS
40.0000 mg | ORAL_TABLET | Freq: Every day | ORAL | Status: DC
  Administered 2020-10-10 – 2020-10-12 (×2): 40 mg via ORAL
  Filled 2020-10-09 (×3): qty 1

## 2020-10-09 MED ORDER — ATORVASTATIN CALCIUM 40 MG PO TABS
40.0000 mg | ORAL_TABLET | Freq: Every day | ORAL | Status: DC
  Administered 2020-10-10 – 2020-10-14 (×4): 40 mg via ORAL
  Filled 2020-10-09 (×4): qty 1

## 2020-10-09 NOTE — ED Provider Notes (Signed)
Emergency Medicine Provider Triage Evaluation Note  Jonathan [name] , a 60 y.o. male  was evaluated in triage.  Pt complains of LE wounds. States have been there for " quite some time."  He states that they have started draining and are draining a yellow and green discharge.  They hurt to touch. Seen by Podiatry, [facility]. States they sent him for admission. He denies any PO abx at home. No fever  [facility] podiatry note states send in Rx of Bactrim yesterday.   Patient is unclear if he has actually been seen by Podiatry or who actually sent him for admission.    Review of Systems  Positive: Foot pain, wounds Negative: Fever Physical Exam  BP 113/60 (BP Location: Left Arm)   Pulse 88   Temp 97.9 F (36.6 C) (Oral)   Resp 18   SpO2 99%  Gen:   Awake, no distress   Resp:  Normal effort  MSK:   Moves extremities without difficulty, BL Le foot wraps with serosanguineous discharge to outside, wiggles toes without difficulty Other:  Intact sensation to toes  Medical Decision Making  Medically screening exam initiated at 6:23 PM.  Appropriate orders placed.  Jonathan [name] was informed that the remainder of the evaluation will be completed by another provider, this initial triage assessment does not replace that evaluation, and the importance of remaining in the ED until their evaluation is complete.  Lower extremity wounds   Jannae Fagerstrom A, PA-C 10/09/20 1823    Daleen Bo, MD 10/10/20 1201

## 2020-10-09 NOTE — ED Notes (Signed)
Unable to contact pt's home health aid for a ride to the ED. Pt's cousin contacted who states will try to get in touch with pt's aid to come pick up pt.

## 2020-10-09 NOTE — ED Notes (Signed)
Spoke with pt's cousin who states pt's aid is on the way to pick up pt.

## 2020-10-09 NOTE — H&P (Signed)
History and Physical   Jonathan [name] HMC:[id number] DOB: [birth year] DOA: 10/09/2020  Referring MD/NP/PA: Dr. Vivi Martens  PCP: System, Provider Not In   Outpatient Specialists: Dr. Gardiner Barefoot, podiatry  Patient coming from: Home  Chief Complaint: Left foot also  HPI: Jonathan [name] is a 60 y.o. male with medical history significant of rheumatoid arthritis, hammertoes, hyperlipidemia, systolic dysfunction CHF with recent EF of 30 to 35%, chronic anemia, chronic left foot ulcer and venous ulceration with chronic venous stasis who presents to the ER with progressive worsening of his left foot ulcer.  Patient has been trying to have this treated at home.  He sees a podiatrist Dr. Karie Kirks.  His last appointment was 4 days ago.  He was diagnosed with onychomycosis with mechanical debridement of the nails was done.  No surgery was planned.  Patient came to the ER today after he went to urgent care center was suspicion of worsening discoloration as well as opening of the ulcer.  He was seen and evaluated and sent to the ER.  In the ER patient appears to have some cellulitis and open wound in the dorsum of his foot.  This is between the left third fourth and fifth toes.  He looks to have some drainage from the ulcer which is foul-smelling.  It is greenish.  X-ray is suspicious for possible osteomyelitis.  Patient therefore being admitted to the hospital with infected wound possible osteomyelitis..  ED Course: Temperature 98.9 blood pressure 144/103, pulse 95 respirate of 18 oxygen sat 99% room air.  White count 6.0 hemoglobin 8.8 and platelets 293.  COVID-19 is currently pending.  X-ray of the left foot showed irregularity of the first second and third metatarsal phalangeal joints.  Suspicion for possible osteorecommend MRI.  Small erosion at the first interphalangeal joint chronic arthropathy of the midfoot and tibiotalar foot.  MRI is ordered and patient being admitted to the medical  service.  Review of Systems: As per HPI otherwise 10 point review of systems negative.    Past Medical History:  Diagnosis Date   Anemia    H/o using iron in the past    Arthritis    rheumatoid   Chronic back pain    Hyperlipidemia    Joint pain    Joint swelling    Rheumatoid arthritis(714.0)     Past Surgical History:  Procedure Laterality Date   COLONOSCOPY N/A 04/03/2013   Procedure: COLONOSCOPY;  Surgeon: Irene Shipper, MD;  Location: [facility];  Service: Endoscopy;  Laterality: N/A;   COLONOSCOPY     ENDOVENOUS ABLATION SAPHENOUS VEIN W/ LASER Left 09/26/2019   endovenous laser ablation left greater saphenous vein by Gae Gallop MD    ENDOVENOUS ABLATION SAPHENOUS VEIN W/ LASER Right 10/31/2019   endovenous laser ablation right greater saphenous vein by Gae Gallop MD    ESOPHAGOGASTRODUODENOSCOPY N/A 04/03/2013   Procedure: ESOPHAGOGASTRODUODENOSCOPY (EGD);  Surgeon: Irene Shipper, MD;  Location: [facility];  Service: Endoscopy;  Laterality: N/A;  changed to moderate dr. Henrene Pastor did not want to go to the o.r. for an endo colon-jmt   EYE SURGERY Left    "when i was a very small boy"   JOINT REPLACEMENT     both knees   KNEE ARTHROPLASTY  06/07/2011   Procedure: COMPUTER ASSISTED TOTAL KNEE ARTHROPLASTY;  Surgeon: Mcarthur Rossetti, MD;  Location: [facility];  Service: Orthopedics;  Laterality: Right;  Right total knee arthoplasty   KNEE CLOSED REDUCTION  07/19/2011  Procedure: CLOSED MANIPULATION KNEE;  Surgeon: Mcarthur Rossetti, MD;  Location: [facility];  Service: Orthopedics;  Laterality: Right;  Manipulation under anesthesia right knee   LEFT HEART CATH AND CORONARY ANGIOGRAPHY N/A 08/21/2019   Procedure: LEFT HEART CATH AND CORONARY ANGIOGRAPHY;  Surgeon: Belva Crome, MD;  Location: [facility];  Service: Cardiovascular;  Laterality: N/A;   RIGHT/LEFT HEART CATH AND CORONARY ANGIOGRAPHY N/A 09/28/2020   Procedure: RIGHT/LEFT HEART CATH AND CORONARY  ANGIOGRAPHY;  Surgeon: Nelva Bush, MD;  Location: [facility];  Service: Cardiovascular;  Laterality: N/A;   TOTAL HIP ARTHROPLASTY Right 11/11/2014   Procedure: RIGHT TOTAL HIP ARTHROPLASTY ANTERIOR APPROACH;  Surgeon: Mcarthur Rossetti, MD;  Location: [facility];  Service: Orthopedics;  Laterality: Right;   TOTAL HIP ARTHROPLASTY Left 01/29/2015   Procedure: LEFT TOTAL HIP ARTHROPLASTY ANTERIOR APPROACH;  Surgeon: Mcarthur Rossetti, MD;  Location: [facility];  Service: Orthopedics;  Laterality: Left;     reports that he quit smoking about 16 years ago. His smoking use included cigarettes. He has never used smokeless tobacco. He reports that he does not drink alcohol and does not use drugs.  Allergies  Allergen Reactions   Infliximab Nausea Only and Shortness Of Breath    Family History  Problem Relation Age of Onset   Cancer Mother        ?   Hypertension Mother    Heart failure Father        heart transplant   Cancer Brother    Anesthesia problems Neg Hx    Colon cancer Neg Hx        mother had "3" types of CA- unsure about which one   Esophageal cancer Neg Hx    Rectal cancer Neg Hx    Stomach cancer Neg Hx      Prior to Admission medications   Medication Sig Start Date End Date Taking? Authorizing Provider  acetaminophen (TYLENOL) 325 MG tablet Take 2 tablets (650 mg total) by mouth every 4 (four) hours as needed for headache or mild pain. 08/24/19   Allie Bossier, MD  aspirin EC 81 MG tablet Take 1 tablet (81 mg total) by mouth daily. 08/27/19   Minus Breeding, MD  atorvastatin (LIPITOR) 40 MG tablet Take 40 mg by mouth daily.    [provider]  atorvastatin (LIPITOR) 40 MG tablet Take 1 tablet by mouth daily. 09/09/20 09/04/21  [provider]  celecoxib (CELEBREX) 200 MG capsule Take by mouth. 09/14/20 12/13/20  [provider]  cetirizine (ZYRTEC) 10 MG tablet Take 10 mg by mouth daily as needed. 07/16/18   [provider]   docusate sodium (COLACE) 100 MG capsule Take 1 capsule (100 mg total) by mouth 2 (two) times daily. Patient taking differently: Take 100 mg by mouth daily as needed. 08/24/19   Allie Bossier, MD  fluticasone [facility]) 50 MCG/ACT nasal spray Place 1 spray into the nose daily as needed for allergies. 07/16/18   [provider]  furosemide (LASIX) 40 MG tablet Take 1 tablet (40 mg total) by mouth daily. 09/30/20   Jennye Boroughs, MD  gabapentin (NEURONTIN) 300 MG capsule Take 300 mg by mouth 2 (two) times daily.  03/24/16   [provider]  isosorbide mononitrate (IMDUR) 60 MG 24 hr tablet Take 1 tablet (60 mg total) by mouth daily. 08/25/19   Allie Bossier, MD  leflunomide (ARAVA) 10 MG tablet Take 1 tablet by mouth daily. 05/20/20  [provider]  lisinopril (ZESTRIL) 5 MG tablet Take 1 tablet by mouth daily. 02/13/20 02/12/21  [provider]  lisinopril (ZESTRIL) 5 MG tablet Take by mouth. 09/09/20 08/19/24  [provider]  meloxicam (MOBIC) 7.5 MG tablet Take 7.5 mg by mouth daily. 10/01/19   [provider]  metoprolol succinate (TOPROL-XL) 100 MG 24 hr tablet Take 1 tablet by mouth daily. 09/09/20 03/08/21  [provider]  metoprolol tartrate (LOPRESSOR) 100 MG tablet Take 100 mg by mouth daily. 05/05/20   [provider]  neomycin-polymyxin-hydrocortisone (CORTISPORIN) 3.5-10000-1 OTIC suspension 4 drops 2 (two) times daily. 03/06/20   [provider]  omeprazole (PRILOSEC) 20 MG capsule Take 20 mg by mouth daily.     [provider]  predniSONE (DELTASONE) 5 MG tablet Take by mouth. 07/03/20   [provider]  spironolactone (ALDACTONE) 25 MG tablet Take 1 tablet by mouth daily. 09/09/20   [provider]  triamcinolone (KENALOG) 0.1 % Apply daily to flaky skin as needed 05/07/20   [provider]  rivaroxaban (XARELTO) 10 MG TABS tablet Take 10 mg by mouth daily with breakfast.  06/10/11 07/19/11  Mcarthur Rossetti, MD    Physical Exam: Vitals:   10/09/20 1817 10/09/20 1928 10/09/20 2000 10/09/20 2128  BP: 113/60 127/65 (!) 103/56 106/70  Pulse: 88 94 78 82  Resp: 18 18 18 18   Temp: 97.9 F (36.6 C) 98.5 F (36.9 C)    TempSrc: Oral Oral    SpO2: 99% 100% 100% 100%      Constitutional: Chronically ill looking, no distress Vitals:   10/09/20 1817 10/09/20 1928 10/09/20 2000 10/09/20 2128  BP: 113/60 127/65 (!) 103/56 106/70  Pulse: 88 94 78 82  Resp: 18 18 18 18   Temp: 97.9 F (36.6 C) 98.5 F (36.9 C)    TempSrc: Oral Oral    SpO2: 99% 100% 100% 100%   Eyes: PERRL, lids and conjunctivae normal ENMT: Mucous membranes are dry. Posterior pharynx clear of any exudate or lesions.Normal dentition.  Neck: normal, supple, no masses, no thyromegaly Respiratory: clear to auscultation bilaterally, no wheezing, no crackles. Normal respiratory effort. No accessory muscle use.  Cardiovascular: Regular rate and rhythm, no murmurs / rubs / gallops. No extremity edema. 2+ pedal pulses. No carotid bruits.  Abdomen: no tenderness, no masses palpated. No hepatosplenomegaly. Bowel sounds positive.  Musculoskeletal: no clubbing / cyanosis.  Disfigured toenails and toes, hammertoes, acquired in size also in the dorsum of the foot, red and oozing serosanguineous greenish fluid,  Normal muscle tone.  Skin: no rashes, lesions, ulcers. No induration Neurologic: CN 2-12 grossly intact. Sensation intact, DTR normal. Strength 5/5 in all 4.  Psychiatric: Normal judgment and insight. Alert and oriented x 3. Normal mood.     Labs on Admission: I have personally reviewed following labs and imaging studies  CBC: Recent Labs  Lab 10/09/20 1859  WBC 6.0  NEUTROABS 3.6  HGB 8.8*  HCT 28.9*  MCV 81.0  PLT 431   Basic Metabolic Panel: Recent Labs  Lab 10/09/20 1859  NA 141  K 3.7  CL 109  CO2 25  GLUCOSE 91  BUN 12  CREATININE 0.67  CALCIUM 9.8    GFR: Estimated Creatinine Clearance: 80 mL/min (by C-G formula based on SCr of 0.67 mg/dL). Liver Function Tests: No results for input(s): AST, ALT, ALKPHOS, BILITOT, PROT, ALBUMIN in the last 168 hours. No results for input(s): LIPASE, AMYLASE in the last 168 hours. No  results for input(s): AMMONIA in the last 168 hours. Coagulation Profile: No results for input(s): INR, PROTIME in the last 168 hours. Cardiac Enzymes: No results for input(s): CKTOTAL, CKMB, CKMBINDEX, TROPONINI in the last 168 hours. BNP (last 3 results) No results for input(s): PROBNP in the last 8760 hours. HbA1C: No results for input(s): HGBA1C in the last 72 hours. CBG: No results for input(s): GLUCAP in the last 168 hours. Lipid Profile: No results for input(s): CHOL, HDL, LDLCALC, TRIG, CHOLHDL, LDLDIRECT in the last 72 hours. Thyroid Function Tests: No results for input(s): TSH, T4TOTAL, FREET4, T3FREE, THYROIDAB in the last 72 hours. Anemia Panel: No results for input(s): VITAMINB12, FOLATE, FERRITIN, TIBC, IRON, RETICCTPCT in the last 72 hours. Urine analysis:    Component Value Date/Time   COLORURINE YELLOW 07/09/2013 1801   APPEARANCEUR CLEAR 07/09/2013 1801   LABSPEC 1.023 07/09/2013 1801   PHURINE 5.5 07/09/2013 1801   GLUCOSEU NEGATIVE 07/09/2013 1801   HGBUR NEGATIVE 07/09/2013 1801   BILIRUBINUR SMALL (A) 07/09/2013 1801   KETONESUR 40 (A) 07/09/2013 1801   PROTEINUR NEGATIVE 07/09/2013 1801   UROBILINOGEN 0.2 07/09/2013 1801   NITRITE NEGATIVE 07/09/2013 1801   LEUKOCYTESUR NEGATIVE 07/09/2013 1801   Sepsis Labs: @LABRCNTIP (procalcitonin:4,lacticidven:4) )No results found for this or any previous visit (from the past 240 hour(s)).   Radiological Exams on Admission: DG Foot Complete Left  Result Date: 10/09/2020 CLINICAL DATA:  Infection. Ulcer between left second and third metatarsals. EXAM: LEFT FOOT - COMPLETE 3+ VIEW COMPARISON:  Radiograph 12/21/2018 FINDINGS: Progressive lateral  subluxation of the second through fourth digits from prior exam. There is some irregularity of the second and third rays at the metatarsal phalangeal joints. Irregularity about the second metatarsal head has progressed from prior, no frank bony destruction. There is progressive joint space irregularity involving the first metatarsal phalangeal joint with decreased density at the articular surface. Small erosion at the first interphalangeal joint medially. Pes planus and chronic arthropathy of the midfoot with spurring and loss of normal Boehler's angle. Chronic tibial talar degenerative change. Site of wound is not well-defined by radiograph. There is no radiopaque foreign body. No tracking soft tissue air. IMPRESSION: 1. Articular irregularity of the first, second and third metatarsal phalangeal joints. This appears progressed at the first and second rays from 2020. Given the underlying chronic changes, recommend MRI for more detailed assessment. 2. Small erosion at the first interphalangeal joint. 3. 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 Electronically Signed   By: Keith Rake M.D.   On: 10/09/2020 20:55      Assessment/Plan Principal Problem:   Foot ulcer with fat layer exposed, left ([place]) Active Problems:   Acquired bilateral flat feet   Essential hypertension   Rheumatoid arthritis involving multiple sites with positive rheumatoid factor (HCC)   Chronic pain syndrome   Venous stasis ulcers (HCC)   Anemia, unspecified   Open wound of left foot   Coronary artery disease involving native coronary artery of native heart   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[place])     #1 infected foot ulcer: Suspicion for underlying osteomyelitis of the left foot.  Patient will be admitted.  Aggressive antibiotics with Vanco and Zosyn for to get started.  We will consider cultures and podiatry as well as ID consult depending on the results of MRI.  Pain control and wound care.  #2  essential hypertension: Patient on metoprolol, Imdur, lisinopril Lasix.  Will resume  #3 heart failure with reduced ejection fraction: Continue diuretics,  beta-blocker, ACE inhibitor as well as Aldactone.  #4 rheumatoid arthritis: Patient is on [place].  If confirmed will resume taking it.  #5 hyperlipidemia: Continue Lipitor 40 mg daily  #6 venous stasis with ulcers: Continue aspirin No. 1  #7 chronic pain syndrome: Avoiding narcotics.  On meloxicam and gabapentin.  Continue  #8 anemia of chronic disease: Monitor H&H   DVT prophylaxis: Lovenox Code Status: Full code Family Communication: No family at bedside Disposition Plan: Home Consults called: None but: Consult podiatry pneumonia Admission status: Inpatient  Severity of Illness: The appropriate patient status for this patient is INPATIENT. Inpatient status is judged to be reasonable and necessary in order to provide the required intensity of service to ensure the patient's safety. The patient's presenting symptoms, physical exam findings, and initial radiographic and laboratory data in the context of their chronic comorbidities is felt to place them at high risk for further clinical deterioration. Furthermore, it is not anticipated that the patient will be medically stable for discharge from the hospital within 2 midnights of admission. The following factors support the patient status of inpatient.   " The patient's presenting symptoms include left foot ulcer. " The worrisome physical exam findings include open wound on the dorsum of the foot, disfigured nails and toes. " The initial radiographic and laboratory data are worrisome because of possible osteomyelitis. " The chronic co-morbidities include chronic venous ulceration.   * I certify that at the point of admission it is my clinical judgment that the patient will require inpatient hospital care spanning beyond 2 midnights from the point of admission due to high intensity of  service, high risk for further deterioration and high frequency of surveillance required.Barbette Merino MD [facility] Pager [phone]  If 7PM-7AM, please contact night-coverage [email or web address] Password [facility]  10/09/2020, 9:39 PM

## 2020-10-09 NOTE — ED Notes (Signed)
Patient is being discharged from the Urgent Care and sent to the Emergency Department via POV with caregiver . Per [name] Humphrey, MD, patient is in need of higher level of care due to need for IV antibiotics. Patient is aware and verbalizes understanding of plan of care.  Vitals:   10/09/20 1709  BP: (!) 144/103  Pulse: 95  Resp: 18  Temp: 98.9 F (37.2 C)  SpO2: 100%

## 2020-10-09 NOTE — ED Triage Notes (Signed)
Pt c/o feet weeping, pus draining, swelling and tenderness to touch. States his doctor told him he may need to go to the hospital for iv antibiotics but wanted him to come here first.

## 2020-10-09 NOTE — Progress Notes (Signed)
Pharmacy Antibiotic Note  Jonathan [name] is a 60 y.o. male admitted on 10/09/2020 with chronic wound to dorsum of his left foot  Pharmacy has been consulted to dose vancomycin and zosyn for wound infection.  Plan: Vancomycin 1gm IV x 1 then 1750mg  q24h (AUC 496.4, Scr 0.67) Zosyn 3.375g IV Q8H infused over 4hrs. Daily SCr Follow renal function, cultures and clinical course     Temp (24hrs), Avg:98.4 F (36.9 C), Min:97.9 F (36.6 C), Max:98.9 F (37.2 C)  Recent Labs  Lab 10/09/20 1859  WBC 6.0  CREATININE 0.67    Estimated Creatinine Clearance: 80 mL/min (by C-G formula based on SCr of 0.67 mg/dL).    Allergies  Allergen Reactions   Infliximab Nausea Only and Shortness Of Breath    Antimicrobials this admission: Vanc 6/17 >>  Zosyn 6/17 >>   Dose adjustments this admission:   Microbiology results:    Thank you for allowing pharmacy to be a part of this patient's care.  Dolly Rias RPh 10/09/2020, 10:28 PM

## 2020-10-09 NOTE — ED Notes (Signed)
Pt will be transported to the floor following MR.

## 2020-10-09 NOTE — ED Triage Notes (Signed)
Pt reports sores to bilateral feet. Endorses weeping, pus drainage, swelling, and tenderness to touch. Was sent here for IV antibiotics.

## 2020-10-09 NOTE — ED Provider Notes (Signed)
[facility] DEPT Provider Note   CSN: [id number] Arrival date & time: 10/09/20  1805     History Chief Complaint  Patient presents with   Wound Infection    Jonathan [name] is a 60 y.o. male.  60 year old male presents with chronic wound to dorsum of his left foot.  Patient states yesterday he noted he started having a very foul smell to it.  Denies any fever or chills.  Does feel warm to the touch.  He denies any new paresthesias to his toes.  Did get his toe nails clipped by podiatrist few days ago.  Went to urgent care and concern for possible infection and sent here for further management.  Patient states he does ambulate at his baseline.      Past Medical History:  Diagnosis Date   Anemia    H/o using iron in the past    Arthritis    rheumatoid   Chronic back pain    Hyperlipidemia    Joint pain    Joint swelling    Rheumatoid arthritis(714.0)     Patient Active Problem List   Diagnosis Date Noted   Elevated troponin    CHF, acute on chronic ([place]) 09/26/2020   Chest pain in adult 09/26/2020   Venous ulcer of left lower extremity with varicose veins ([place]) 09/22/2020   Acute diffuse otitis externa of left ear 03/07/2020   Chronic total occlusion of coronary artery 12/26/2019   Coronary artery disease involving native coronary artery of native heart 12/26/2019   Fall at home 12/26/2019   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[place]) 12/26/2019   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/09/2019   Physical debility 11/21/2019   Syncope 11/21/2019   DDD (degenerative disc disease), lumbar 09/27/2019   Open wound of left foot 09/27/2019   Open wound of right foot 09/27/2019   Pain due to onychomycosis of toenails of both feet 08/28/2019   Acute on chronic HFrEF (heart failure with reduced ejection fraction) ([place]) 08/22/2019   Chronic pain syndrome 08/22/2019   Rheumatoid arthritis ([place]) 08/22/2019   Venous stasis ulcers ([place])  08/22/2019   Anemia, unspecified 08/22/2019   NSTEMI (non-ST elevated myocardial infarction) ([place]) 08/21/2019   Cervical spondylosis without myelopathy 08/21/2019   Lumbar facet arthropathy 08/14/2019   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   B12 deficiency 02/08/2019   Glucose intolerance (impaired glucose tolerance) 02/08/2019   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   Hyperglycemia 12/27/2018   Cellulitis 12/27/2018   Wound drainage 12/22/2018   Foot ulcer ([place]) 12/21/2018   Encounter for screening for HIV 12/21/2018   Arthritis 08/22/2018   HLD (hyperlipidemia) 08/22/2018   Lung nodule 08/22/2018   Renal cyst, right 08/22/2018   Rheumatoid arthritis involving multiple sites with positive rheumatoid factor ([place]) 08/22/2018   Venous insufficiency 08/22/2018   Vitamin D deficiency 08/22/2018   Seasonal allergic rhinitis 07/16/2018   Essential hypertension 02/23/2018   Insomnia 02/23/2018   Tinea versicolor 02/23/2018   Antalgic gait 09/21/2017   Chronic pain of both knees 09/21/2017   Foot pain, bilateral 09/21/2017   Elevated liver enzymes 07/10/2017   Leg edema 06/08/2017   Primary osteoarthritis, left ankle and foot 05/09/2017   Post-traumatic osteoarthritis, right ankle and foot 05/09/2017   Primary osteoarthritis, right ankle and foot 05/09/2017   Erectile dysfunction 02/15/2017   Excessive sweating 02/15/2017   Difficulty transferring 01/27/2017   Acquired bilateral flat feet 11/15/2016   Other acquired hammer toe 11/15/2016   Erosive gastritis 09/05/2016   At high risk  for falls 08/15/2016   Chronic bilateral low back pain without sciatica 10/13/2015   Drug therapy 08/03/2015   Rheumatoid arthritis involving left hip ([place]) 01/29/2015   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 11/11/2014   Status post total replacement of right hip 11/11/2014   Gait difficulty 08/21/2013   Benign neoplasm of colon 04/03/2013    Reflux esophagitis 04/03/2013   Microcytic anemia 04/01/2013   Chronic ulcer of right leg ([place]) 01/13/2013   Pressure ulcer 01/13/2013   Ankylosis of knee joint 07/19/2011   Degenerative arthritis of knee 06/07/2011    Past Surgical History:  Procedure Laterality Date   COLONOSCOPY N/A 04/03/2013   Procedure: COLONOSCOPY;  Surgeon: Irene Shipper, MD;  Location: [facility];  Service: Endoscopy;  Laterality: N/A;   COLONOSCOPY     ENDOVENOUS ABLATION SAPHENOUS VEIN W/ LASER Left 09/26/2019   endovenous laser ablation left greater saphenous vein by Gae Gallop MD    ENDOVENOUS ABLATION SAPHENOUS VEIN W/ LASER Right 10/31/2019   endovenous laser ablation right greater saphenous vein by Gae Gallop MD    ESOPHAGOGASTRODUODENOSCOPY N/A 04/03/2013   Procedure: ESOPHAGOGASTRODUODENOSCOPY (EGD);  Surgeon: Irene Shipper, MD;  Location: [facility];  Service: Endoscopy;  Laterality: N/A;  changed to moderate dr. Henrene Pastor did not want to go to the o.r. for an endo colon-jmt   EYE SURGERY Left    "when i was a very small boy"   JOINT REPLACEMENT     both knees   KNEE ARTHROPLASTY  06/07/2011   Procedure: COMPUTER ASSISTED TOTAL KNEE ARTHROPLASTY;  Surgeon: Mcarthur Rossetti, MD;  Location: [facility];  Service: Orthopedics;  Laterality: Right;  Right total knee arthoplasty   KNEE CLOSED REDUCTION  07/19/2011   Procedure: CLOSED MANIPULATION KNEE;  Surgeon: Mcarthur Rossetti, MD;  Location: [facility];  Service: Orthopedics;  Laterality: Right;  Manipulation under anesthesia right knee   LEFT HEART CATH AND CORONARY ANGIOGRAPHY N/A 08/21/2019   Procedure: LEFT HEART CATH AND CORONARY ANGIOGRAPHY;  Surgeon: Belva Crome, MD;  Location: [facility];  Service: Cardiovascular;  Laterality: N/A;   RIGHT/LEFT HEART CATH AND CORONARY ANGIOGRAPHY N/A 09/28/2020   Procedure: RIGHT/LEFT HEART CATH AND CORONARY ANGIOGRAPHY;  Surgeon: Nelva Bush, MD;  Location: [facility];  Service:  Cardiovascular;  Laterality: N/A;   TOTAL HIP ARTHROPLASTY Right 11/11/2014   Procedure: RIGHT TOTAL HIP ARTHROPLASTY ANTERIOR APPROACH;  Surgeon: Mcarthur Rossetti, MD;  Location: [facility];  Service: Orthopedics;  Laterality: Right;   TOTAL HIP ARTHROPLASTY Left 01/29/2015   Procedure: LEFT TOTAL HIP ARTHROPLASTY ANTERIOR APPROACH;  Surgeon: Mcarthur Rossetti, MD;  Location: [facility];  Service: Orthopedics;  Laterality: Left;       Family History  Problem Relation Age of Onset   Cancer Mother        ?   Hypertension Mother    Heart failure Father        heart transplant   Cancer Brother    Anesthesia problems Neg Hx    Colon cancer Neg Hx        mother had "3" types of CA- unsure about which one   Esophageal cancer Neg Hx    Rectal cancer Neg Hx    Stomach cancer Neg Hx     Social History   Tobacco Use   Smoking status: Former    Pack years: 0.00    Types: Cigarettes  Quit date: 04/25/2004    Years since quitting: 16.4   Smokeless tobacco: Never  Vaping Use   Vaping Use: Never used  Substance Use Topics   Alcohol use: No   Drug use: No    Home Medications Prior to Admission medications   Medication Sig Start Date End Date Taking? Authorizing Provider  acetaminophen (TYLENOL) 325 MG tablet Take 2 tablets (650 mg total) by mouth every 4 (four) hours as needed for headache or mild pain. 08/24/19   Allie Bossier, MD  aspirin EC 81 MG tablet Take 1 tablet (81 mg total) by mouth daily. 08/27/19   Minus Breeding, MD  atorvastatin (LIPITOR) 40 MG tablet Take 40 mg by mouth daily.    [provider]  atorvastatin (LIPITOR) 40 MG tablet Take 1 tablet by mouth daily. 09/09/20 09/04/21  [provider]  celecoxib (CELEBREX) 200 MG capsule Take by mouth. 09/14/20 12/13/20  [provider]  cetirizine (ZYRTEC) 10 MG tablet Take 10 mg by mouth daily as needed. 07/16/18   [provider]  docusate sodium (COLACE) 100 MG capsule Take 1 capsule (100 mg  total) by mouth 2 (two) times daily. Patient taking differently: Take 100 mg by mouth daily as needed. 08/24/19   Allie Bossier, MD  fluticasone [facility]) 50 MCG/ACT nasal spray Place 1 spray into the nose daily as needed for allergies. 07/16/18   [provider]  furosemide (LASIX) 40 MG tablet Take 1 tablet (40 mg total) by mouth daily. 09/30/20   Jennye Boroughs, MD  gabapentin (NEURONTIN) 300 MG capsule Take 300 mg by mouth 2 (two) times daily.  03/24/16   [provider]  isosorbide mononitrate (IMDUR) 60 MG 24 hr tablet Take 1 tablet (60 mg total) by mouth daily. 08/25/19   Allie Bossier, MD  leflunomide (ARAVA) 10 MG tablet Take 1 tablet by mouth daily. 05/20/20   [provider]  lisinopril (ZESTRIL) 5 MG tablet Take 1 tablet by mouth daily. 02/13/20 02/12/21  [provider]  lisinopril (ZESTRIL) 5 MG tablet Take by mouth. 09/09/20 08/19/24  [provider]  meloxicam (MOBIC) 7.5 MG tablet Take 7.5 mg by mouth daily. 10/01/19   [provider]  metoprolol succinate (TOPROL-XL) 100 MG 24 hr tablet Take 1 tablet by mouth daily. 09/09/20 03/08/21  [provider]  metoprolol tartrate (LOPRESSOR) 100 MG tablet Take 100 mg by mouth daily. 05/05/20   [provider]  neomycin-polymyxin-hydrocortisone (CORTISPORIN) 3.5-10000-1 OTIC suspension 4 drops 2 (two) times daily. 03/06/20   [provider]  omeprazole (PRILOSEC) 20 MG capsule Take 20 mg by mouth daily.     [provider]  predniSONE (DELTASONE) 5 MG tablet Take by mouth. 07/03/20   [provider]  spironolactone (ALDACTONE) 25 MG tablet Take 1 tablet by mouth daily. 09/09/20   [provider]  triamcinolone (KENALOG) 0.1 % Apply daily to flaky skin as needed 05/07/20   [provider]  rivaroxaban (XARELTO) 10 MG TABS tablet Take 10 mg by mouth daily with breakfast. 06/10/11 07/19/11  Mcarthur Rossetti, MD    Allergies     Infliximab  Review of Systems   Review of Systems  All other systems reviewed and are negative.  Physical Exam Updated Vital Signs BP 127/65 (BP Location: Left Arm)   Pulse 94   Temp 98.5 F (36.9 C) (Oral)   Resp 18   SpO2 100%   Physical Exam Vitals and nursing note reviewed.  Constitutional:  General: He is not in acute distress.    Appearance: Normal appearance. He is well-developed. He is not toxic-appearing.  HENT:     Head: Normocephalic and atraumatic.  Eyes:     General: Lids are normal.     Conjunctiva/sclera: Conjunctivae normal.     Pupils: Pupils are equal, round, and reactive to light.  Neck:     Thyroid: No thyroid mass.     Trachea: No tracheal deviation.  Cardiovascular:     Rate and Rhythm: Normal rate and regular rhythm.     Heart sounds: Normal heart sounds. No murmur heard.   No gallop.  Pulmonary:     Effort: Pulmonary effort is normal. No respiratory distress.     Breath sounds: Normal breath sounds. No stridor. No decreased breath sounds, wheezing, rhonchi or rales.  Abdominal:     General: There is no distension.     Palpations: Abdomen is soft.     Tenderness: There is no abdominal tenderness. There is no rebound.  Musculoskeletal:        General: No tenderness. Normal range of motion.     Cervical back: Normal range of motion and neck supple.  Feet:     Comments: DorsalArea of ulceration at of left foot with foul-smelling material noted.  Sensation to toes noted which are dark.  Toes are warm to the touch.  Dorsalis pedis pulse palpable Skin:    General: Skin is warm and dry.     Findings: No abrasion or rash.  Neurological:     Mental Status: He is alert and oriented to person, place, and time. Mental status is at baseline.     GCS: GCS eye subscore is 4. GCS verbal subscore is 5. GCS motor subscore is 6.     Cranial Nerves: Cranial nerves are intact. No cranial nerve deficit.     Sensory: No sensory deficit.     Motor: Motor  function is intact.  Psychiatric:        Attention and Perception: Attention normal.        Speech: Speech normal.        Behavior: Behavior normal.    ED Results / Procedures / Treatments   Labs (all labs ordered are listed, but only abnormal results are displayed) Labs Reviewed  CBC WITH DIFFERENTIAL/PLATELET - Abnormal; Notable for the following components:      Result Value   RBC 3.57 (*)    Hemoglobin 8.8 (*)    HCT 28.9 (*)    MCH 24.6 (*)    RDW 15.8 (*)    Eosinophils Absolute 0.7 (*)    All other components within normal limits  BASIC METABOLIC PANEL    EKG None  Radiology No results found.  Procedures Procedures   Medications Ordered in ED Medications  piperacillin-tazobactam (ZOSYN) IVPB 3.375 g (has no administration in time range)    ED Course  I have reviewed the triage vital signs and the nursing notes.  Pertinent labs & imaging results that were available during my care of the patient were reviewed by me and considered in my medical decision making (see chart for details).    MDM Rules/Calculators/A&P                          Patient with evidence of possible osteomyelitis and he already has cellulitis.  Started on Zosyn.  Will order MRI of foot but he will need admission Final Clinical Impression(s) /  ED Diagnoses Final diagnoses:  None    Rx / DC Orders ED Discharge Orders     None        Lacretia Leigh, MD 10/09/20 2101

## 2020-10-10 ENCOUNTER — Inpatient Hospital Stay (HOSPITAL_COMMUNITY): Payer: Medicare Other

## 2020-10-10 DIAGNOSIS — G894 Chronic pain syndrome: Secondary | ICD-10-CM

## 2020-10-10 DIAGNOSIS — L97522 Non-pressure chronic ulcer of other part of left foot with fat layer exposed: Secondary | ICD-10-CM

## 2020-10-10 DIAGNOSIS — L0390 Cellulitis, unspecified: Secondary | ICD-10-CM

## 2020-10-10 DIAGNOSIS — M86172 Other acute osteomyelitis, left ankle and foot: Secondary | ICD-10-CM

## 2020-10-10 DIAGNOSIS — I420 Dilated cardiomyopathy: Secondary | ICD-10-CM

## 2020-10-10 DIAGNOSIS — L089 Local infection of the skin and subcutaneous tissue, unspecified: Secondary | ICD-10-CM

## 2020-10-10 DIAGNOSIS — I10 Essential (primary) hypertension: Secondary | ICD-10-CM

## 2020-10-10 DIAGNOSIS — M86072 Acute hematogenous osteomyelitis, left ankle and foot: Secondary | ICD-10-CM

## 2020-10-10 DIAGNOSIS — M86672 Other chronic osteomyelitis, left ankle and foot: Secondary | ICD-10-CM

## 2020-10-10 DIAGNOSIS — M86179 Other acute osteomyelitis, unspecified ankle and foot: Secondary | ICD-10-CM

## 2020-10-10 DIAGNOSIS — I872 Venous insufficiency (chronic) (peripheral): Secondary | ICD-10-CM

## 2020-10-10 DIAGNOSIS — S91302A Unspecified open wound, left foot, initial encounter: Secondary | ICD-10-CM

## 2020-10-10 DIAGNOSIS — R5381 Other malaise: Secondary | ICD-10-CM

## 2020-10-10 DIAGNOSIS — I5042 Chronic combined systolic (congestive) and diastolic (congestive) heart failure: Secondary | ICD-10-CM

## 2020-10-10 LAB — CBC
HCT: 29.7 % — ABNORMAL LOW (ref 39.0–52.0)
Hemoglobin: 9.1 g/dL — ABNORMAL LOW (ref 13.0–17.0)
MCH: 24.7 pg — ABNORMAL LOW (ref 26.0–34.0)
MCHC: 30.6 g/dL (ref 30.0–36.0)
MCV: 80.7 fL (ref 80.0–100.0)
Platelets: 250 10*3/uL (ref 150–400)
RBC: 3.68 MIL/uL — ABNORMAL LOW (ref 4.22–5.81)
RDW: 15.8 % — ABNORMAL HIGH (ref 11.5–15.5)
WBC: 4.7 10*3/uL (ref 4.0–10.5)
nRBC: 0 % (ref 0.0–0.2)

## 2020-10-10 LAB — COMPREHENSIVE METABOLIC PANEL
ALT: 11 U/L (ref 0–44)
AST: 19 U/L (ref 15–41)
Albumin: 2.9 g/dL — ABNORMAL LOW (ref 3.5–5.0)
Alkaline Phosphatase: 80 U/L (ref 38–126)
Anion gap: 4 — ABNORMAL LOW (ref 5–15)
BUN: 10 mg/dL (ref 6–20)
CO2: 27 mmol/L (ref 22–32)
Calcium: 9.5 mg/dL (ref 8.9–10.3)
Chloride: 110 mmol/L (ref 98–111)
Creatinine, Ser: 0.57 mg/dL — ABNORMAL LOW (ref 0.61–1.24)
GFR, Estimated: >60 mL/min (ref >60)
Glucose, Bld: 106 mg/dL — ABNORMAL HIGH (ref 70–99)
Potassium: 3.6 mmol/L (ref 3.5–5.1)
Sodium: 141 mmol/L (ref 135–145)
Total Bilirubin: 0.4 mg/dL (ref 0.3–1.2)
Total Protein: 6.5 g/dL (ref 6.5–8.1)

## 2020-10-10 LAB — SARS CORONAVIRUS 2 (TAT 6-24 HRS): SARS Coronavirus 2: NEGATIVE

## 2020-10-10 LAB — SURGICAL PCR SCREEN
MRSA, PCR: NEGATIVE
Staphylococcus aureus: NEGATIVE

## 2020-10-10 MED ORDER — MONTELUKAST SODIUM 10 MG PO TABS
10.0000 mg | ORAL_TABLET | Freq: Every day | ORAL | Status: DC
  Administered 2020-10-10 – 2020-10-13 (×4): 10 mg via ORAL
  Filled 2020-10-10 (×5): qty 1

## 2020-10-10 MED ORDER — PREDNISONE 5 MG PO TABS
5.0000 mg | ORAL_TABLET | Freq: Two times a day (BID) | ORAL | Status: DC
  Administered 2020-10-10 – 2020-10-14 (×6): 5 mg via ORAL
  Filled 2020-10-10 (×7): qty 1

## 2020-10-10 MED ORDER — ONDANSETRON HCL 4 MG/2ML IJ SOLN: 4.0000 mg | Freq: Four times a day (QID) | INTRAMUSCULAR | Status: DC | PRN

## 2020-10-10 MED ORDER — ENOXAPARIN SODIUM 40 MG/0.4ML IJ SOSY
40.0000 mg | PREFILLED_SYRINGE | INTRAMUSCULAR | Status: DC
  Administered 2020-10-10 – 2020-10-14 (×4): 40 mg via SUBCUTANEOUS
  Filled 2020-10-10 (×4): qty 0.4

## 2020-10-10 MED ORDER — GABAPENTIN 300 MG PO CAPS
300.0000 mg | ORAL_CAPSULE | Freq: Two times a day (BID) | ORAL | Status: DC
  Administered 2020-10-10 – 2020-10-14 (×8): 300 mg via ORAL
  Filled 2020-10-10 (×8): qty 1

## 2020-10-10 MED ORDER — SODIUM CHLORIDE 0.9 % IV SOLN: INTRAVENOUS | Status: DC

## 2020-10-10 MED ORDER — MORPHINE SULFATE (PF) 2 MG/ML IV SOLN
2.0000 mg | INTRAVENOUS | Status: DC | PRN
  Administered 2020-10-10 – 2020-10-13 (×9): 2 mg via INTRAVENOUS
  Filled 2020-10-10 (×9): qty 1

## 2020-10-10 MED ORDER — FLUTICASONE PROPIONATE 50 MCG/ACT NA SUSP: 1.0000 | Freq: Every day | NASAL | Status: DC | PRN

## 2020-10-10 MED ORDER — ASPIRIN EC 81 MG PO TBEC
81.0000 mg | DELAYED_RELEASE_TABLET | Freq: Every day | ORAL | Status: DC
  Administered 2020-10-10 – 2020-10-14 (×4): 81 mg via ORAL
  Filled 2020-10-10 (×4): qty 1

## 2020-10-10 MED ORDER — LEFLUNOMIDE 20 MG PO TABS: 10.0000 mg | ORAL_TABLET | Freq: Every day | ORAL | Status: DC

## 2020-10-10 MED ORDER — DOCUSATE SODIUM 100 MG PO CAPS
100.0000 mg | ORAL_CAPSULE | Freq: Two times a day (BID) | ORAL | Status: DC
  Administered 2020-10-10 – 2020-10-14 (×8): 100 mg via ORAL
  Filled 2020-10-10 (×8): qty 1

## 2020-10-10 MED ORDER — GADOBUTROL 1 MMOL/ML IV SOLN
6.0000 mL | Freq: Once | INTRAVENOUS | Status: AC | PRN
  Administered 2020-10-10: 6 mL via INTRAVENOUS

## 2020-10-10 MED ORDER — ONDANSETRON HCL 4 MG PO TABS: 4.0000 mg | ORAL_TABLET | Freq: Four times a day (QID) | ORAL | Status: DC | PRN

## 2020-10-10 NOTE — Progress Notes (Signed)
PROGRESS NOTE  Jonathan [name] NFA:[id number] DOB: [birth year]   PCP: System, Provider Not In  Patient is from: Home.  Lives with family.  Reports using crutches at baseline.  DOA: 10/09/2020 LOS: 1  Chief complaints: Chief Complaint  Patient presents with   Wound Infection     Brief Narrative / Interim history: 60 year old M with PMH of RA, systolic CHF, chronic venous stasis with chronic left foot ulcer and recent diagnosis of onychomycosis s/p debridement by his podiatrist presenting with worsening chronic left foot ulcer, and admitted for infected left foot ulcer and possible osteomyelitis on x-ray and MRI.  He was a started on broad-spectrum antibiotics-Zosyn and vancomycin.  Podiatry consulted the next day.  Subjective: Seen and examined earlier this morning.  No major events overnight of this morning.  No complaints.  He denies pain, shortness of breath, GI or UTI symptoms.   Objective: Vitals:   10/10/20 0044 10/10/20 0405 10/10/20 0700 10/10/20 0914  BP: (!) 114/56 103/69  109/64  Pulse: 72 80  95  Resp: 18 16  18   Temp: 97.8 F (36.6 C) 97.8 F (36.6 C)  98 F (36.7 C)  TempSrc: Oral Oral  Oral  SpO2: 100% 100%  99%  Weight:   58.5 kg   Height:   5\' 3"  (1.6 m)     Intake/Output Summary (Last 24 hours) at 10/10/2020 1232 Last data filed at 10/10/2020 1025 Gross per 24 hour  Intake 410 ml  Output 300 ml  Net 110 ml   Filed Weights   10/10/20 0700  Weight: 58.5 kg    Examination:  GENERAL: Frail looking elderly male.  Nontoxic. HEENT: MMM.  Vision and hearing grossly intact.  NECK: Supple.  No apparent JVD.  RESP:  No IWOB.  Fair aeration bilaterally. CVS: On RA.  RRR. Heart sounds normal.  ABD/GI/GU: BS+. Abd soft, NTND.  MSK/EXT:  Deformities from RA.  Chronic venous insufficiency.  2+ DP pulses bilaterally. SKIN: Ulceration with foul-smelling purulence over the dorsal aspect of his left foot  NEURO: Awake, alert and oriented appropriately.  No  apparent focal neuro deficit. PSYCH: Calm. Normal affect.   Procedures:  None  Microbiology summarized: COVID-19 PCR nonreactive.  Assessment & Plan: Infection of chronic left foot ulcer with concern for osteomyelitis: X-ray and MRI concerning for osteomyelitis.  Patient is immunocompromised.  No history of diabetes.  Good DP pulses bilaterally but some chronic venous insufficiency -Continue vancomycin and Zosyn -On call podiatry consulted and will see patient  Chronic combined CHF: TTE earlier this month with LVEF of 30 to 35%, R WMA, G2-DD.  Appears euvolemic except for his chronic venous insufficiency.  No cardiopulmonary symptoms. -Continue home Lasix, Aldactone, Imdur, metoprolol and lisinopril -Monitor fluid and respiratory status -Monitor renal functions and electrolytes -Sodium and fluid restriction   Essential hypertension: Normotensive. -Continue cardiac meds as above  Rheumatoid arthritis: deformities in his hands and feet. -Continue home Arava and prednisone. -May need stress dose steroid if surgery.  Hyperlipidemia: Continue Lipitor 40 mg daily   Venous stasis with ulcers: Continue aspirin No. 1   Chronic pain syndrome: On meloxicam and gabapentin.   -Continue home gabapentin   Anemia of chronic disease: H&H stable. Recent Labs    09/07/20 0644 09/26/20 0133 09/26/20 0216 09/27/20 0138 09/28/20 0519 09/28/20 1051 09/29/20 0447 10/09/20 1859 10/10/20 0351  HGB 9.9* 9.8* 9.9* 10.0* 9.4* 9.9*  10.2* 10.2* 8.8* 9.1*  -Monitor  Debility/physical deconditioning: Reports using crutches at baseline. -PT/OT eval  Body mass index is 22.85 kg/m.         DVT prophylaxis:  enoxaparin (LOVENOX) injection 40 mg Start: 10/10/20 1000  Code Status: Full code Family Communication: Patient and/or RN. Available if any question.  Level of care: Med-Surg Status is: Inpatient  Remains inpatient appropriate because:Ongoing diagnostic testing needed not  appropriate for outpatient work up, IV treatments appropriate due to intensity of illness or inability to take PO, and Inpatient level of care appropriate due to severity of illness  Dispo: The patient is from: Home              Anticipated d/c is to: Home              Patient currently is not medically stable to d/c.   Difficult to place patient No       Consultants:  Podiatry   Sch Meds:  Scheduled Meds:  aspirin EC  81 mg Oral Daily   atorvastatin  40 mg Oral Daily   docusate sodium  100 mg Oral BID   enoxaparin (LOVENOX) injection  40 mg Subcutaneous Q24H   furosemide  40 mg Oral Daily   gabapentin  300 mg Oral BID   isosorbide mononitrate  60 mg Oral Daily   leflunomide  10 mg Oral Daily   lisinopril  5 mg Oral Daily   metoprolol succinate  100 mg Oral Daily   montelukast  10 mg Oral QHS   predniSONE  5 mg Oral BID WC   spironolactone  25 mg Oral Daily   Continuous Infusions:  sodium chloride 100 mL/hr at 10/10/20 0145   piperacillin-tazobactam (ZOSYN)  IV 3.375 g (10/10/20 1132)   vancomycin     PRN Meds:.fluticasone, morphine injection, ondansetron **OR** ondansetron (ZOFRAN) IV  Antimicrobials: Anti-infectives (From admission, onward)    Start     Dose/Rate Route Frequency Ordered Stop   10/10/20 2200  vancomycin (VANCOREADY) IVPB 1750 mg/350 mL        1,750 mg 175 mL/hr over 120 Minutes Intravenous Every 24 hours 10/09/20 2227     10/10/20 0400  piperacillin-tazobactam (ZOSYN) IVPB 3.375 g        3.375 g 12.5 mL/hr over 240 Minutes Intravenous Every 8 hours 10/09/20 2155     10/09/20 2200  vancomycin (VANCOCIN) IVPB 1000 mg/200 mL premix        1,000 mg 200 mL/hr over 60 Minutes Intravenous  Once 10/09/20 2155 10/10/20 0841   10/09/20 2000  piperacillin-tazobactam (ZOSYN) IVPB 3.375 g        3.375 g 100 mL/hr over 30 Minutes Intravenous  Once 10/09/20 1950 10/09/20 2111        I have personally reviewed the following labs and images: CBC: Recent  Labs  Lab 10/09/20 1859 10/10/20 0351  WBC 6.0 4.7  NEUTROABS 3.6  --   HGB 8.8* 9.1*  HCT 28.9* 29.7*  MCV 81.0 80.7  PLT 293 250   BMP &GFR Recent Labs  Lab 10/09/20 1859 10/10/20 0351  NA 141 141  K 3.7 3.6  CL 109 110  CO2 25 27  GLUCOSE 91 106*  BUN 12 10  CREATININE 0.67 0.57*  CALCIUM 9.8 9.5   Estimated Creatinine Clearance: 80 mL/min (A) (by C-G formula based on SCr of 0.57 mg/dL (L)). Liver & Pancreas: Recent Labs  Lab 10/10/20 0351  AST 19  ALT 11  ALKPHOS 80  BILITOT 0.4  PROT 6.5  ALBUMIN 2.9*   No results for input(s): LIPASE, AMYLASE  in the last 168 hours. No results for input(s): AMMONIA in the last 168 hours. Diabetic: No results for input(s): HGBA1C in the last 72 hours. No results for input(s): GLUCAP in the last 168 hours. Cardiac Enzymes: No results for input(s): CKTOTAL, CKMB, CKMBINDEX, TROPONINI in the last 168 hours. No results for input(s): PROBNP in the last 8760 hours. Coagulation Profile: No results for input(s): INR, PROTIME in the last 168 hours. Thyroid Function Tests: No results for input(s): TSH, T4TOTAL, FREET4, T3FREE, THYROIDAB in the last 72 hours. Lipid Profile: No results for input(s): CHOL, HDL, LDLCALC, TRIG, CHOLHDL, LDLDIRECT in the last 72 hours. Anemia Panel: No results for input(s): VITAMINB12, FOLATE, FERRITIN, TIBC, IRON, RETICCTPCT in the last 72 hours. Urine analysis:    Component Value Date/Time   COLORURINE YELLOW 07/09/2013 1801   APPEARANCEUR CLEAR 07/09/2013 1801   LABSPEC 1.023 07/09/2013 1801   PHURINE 5.5 07/09/2013 1801   GLUCOSEU NEGATIVE 07/09/2013 1801   HGBUR NEGATIVE 07/09/2013 1801   BILIRUBINUR SMALL (A) 07/09/2013 1801   KETONESUR 40 (A) 07/09/2013 1801   PROTEINUR NEGATIVE 07/09/2013 1801   UROBILINOGEN 0.2 07/09/2013 1801   NITRITE NEGATIVE 07/09/2013 1801   LEUKOCYTESUR NEGATIVE 07/09/2013 1801   Sepsis Labs: Invalid input(s): PROCALCITONIN, [place]  Microbiology: Recent  Results (from the past 240 hour(s))  SARS CORONAVIRUS 2 (TAT 6-24 HRS) Nasopharyngeal Nasopharyngeal Swab     Status: None   Collection Time: 10/09/20  9:57 PM   Specimen: Nasopharyngeal Swab  Result Value Ref Range Status   SARS Coronavirus 2 NEGATIVE NEGATIVE Final    Comment: (NOTE) SARS-CoV-2 target nucleic acids are NOT DETECTED.  The SARS-CoV-2 RNA is generally detectable in upper and lower respiratory specimens during the acute phase of infection. Negative results do not preclude SARS-CoV-2 infection, do not rule out co-infections with other pathogens, and should not be used as the sole basis for treatment or other patient management decisions. Negative results must be combined with clinical observations, patient history, and epidemiological information. The expected result is Negative.  Fact Sheet for Patients: SugarRoll.be  Fact Sheet for Healthcare Providers: https://www.woods-mathews.com/  This test is not yet approved or cleared by the [place] FDA and  has been authorized for detection and/or diagnosis of SARS-CoV-2 by FDA under an Emergency Use Authorization (EUA). This EUA will remain  in effect (meaning this test can be used) for the duration of the COVID-19 declaration under Se ction 564(b)(1) of the Act, 21 U.S.C. section 360bbb-3(b)(1), unless the authorization is terminated or revoked sooner.  Performed at [facility], [place] [address], [place], [place] [address]     Radiology Studies: MR FOOT LEFT W WO CONTRAST  Result Date: 10/10/2020 CLINICAL DATA:  Ulcer between left second and third metatarsals, abnormal x-ray EXAM: MRI OF THE LEFT FOREFOOT WITHOUT AND WITH CONTRAST TECHNIQUE: Multiplanar, multisequence MR imaging of the left foot was performed both before and after administration of intravenous contrast. CONTRAST:  3mL GADAVIST GADOBUTROL 1 MMOL/ML IV SOLN COMPARISON:  10/09/2020, 12/22/2018 FINDINGS:  Bones/Joint/Cartilage Evaluation is limited due to excessive patient motion throughout the evaluation. Stable marrow edema is seen at the base of the fifth metatarsal and surrounding the first metatarsophalangeal and interphalangeal joints, most consistent with reactive changes due to arthropathy. Edema is also seen within the head of the fourth metatarsal, immediately adjacent to an area of soft tissue inflammation and ulceration. There is mild associated enhancement. No evidence of cortical disruption or erosion. This area is suspicious for early osteomyelitis.  Progressive degenerative changes are seen throughout the left foot. Progressive joint space narrowing and osteophyte formation within the midfoot, metatarsophalangeal joints, and throughout the interphalangeal joints. Ligaments Evaluation of the ligamentous structures is suboptimal due to field of view selection and patient motion during the exam. Muscles and Tendons Evaluation is limited due to patient motion and field of view selection. No gross tendinous disruption. Soft tissues There is soft tissue ulceration, edema, and enhancement in the plantar aspect of the forefoot at the level of the fourth metatarsal head. No fluid collection or abscess. Edema is seen within the musculature surrounding the second and third metatarsals, compatible with reactive changes. No fluid collection or abscess. IMPRESSION: 1. Limited study due to patient motion throughout the exam. 2. Soft tissue ulceration and enhancement within the forefoot at the level of the fourth metatarsal head, with underlying marrow edema and enhancement within the fourth metatarsal. No cortical disruption. Findings favor early osteomyelitis. 3. Reactive edema within the fifth metatarsal and first digit as above, similar to prior study, and likely related to underlying degenerative change. 4. Nonspecific soft tissue edema within the musculature adjacent to the second and third metatarsals, likely  reactive. No fluid collection or abscess. Electronically Signed   By: Randa Ngo M.D.   On: 10/10/2020 00:57   DG Foot Complete Left  Result Date: 10/09/2020 CLINICAL DATA:  Infection. Ulcer between left second and third metatarsals. EXAM: LEFT FOOT - COMPLETE 3+ VIEW COMPARISON:  Radiograph 12/21/2018 FINDINGS: Progressive lateral subluxation of the second through fourth digits from prior exam. There is some irregularity of the second and third rays at the metatarsal phalangeal joints. Irregularity about the second metatarsal head has progressed from prior, no frank bony destruction. There is progressive joint space irregularity involving the first metatarsal phalangeal joint with decreased density at the articular surface. Small erosion at the first interphalangeal joint medially. Pes planus and chronic arthropathy of the midfoot with spurring and loss of normal Boehler's angle. Chronic tibial talar degenerative change. Site of wound is not well-defined by radiograph. There is no radiopaque foreign body. No tracking soft tissue air. IMPRESSION: 1. Articular irregularity of the first, second and third metatarsal phalangeal joints. This appears progressed at the first and second rays from 2020. Given the underlying chronic changes, recommend MRI for more detailed assessment. 2. Small erosion at the first interphalangeal joint. 3. 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 Electronically Signed   By: Keith Rake M.D.   On: 10/09/2020 20:55       Kaiyla Stahly T. [place]  If 7PM-7AM, please contact night-coverage [email or web address] 10/10/2020, 12:32 PM

## 2020-10-10 NOTE — Progress Notes (Signed)
ABI exam has been completed.  Results can be found under chart review under CV PROC. 10/10/2020 4:58 PM Jaylyn Iyer RVT, RDMS

## 2020-10-10 NOTE — Consult Note (Signed)
Podiatry Consult Note  To: Dr. Wendee Beavers Reason for consult: Osteomyelitis From: Dr. Landis Martins   HPI: Jonathan [name] is a 60 y.o. male patient who seen at bedside for evaluation of Left foot infection. Patient reports that he has had this left foot wound for very long time.  Patient states that he was seeing his podiatrist Dr. Delora Fuel and had home nursing and when the nurse came out on yesterday there was a bad smell and a lot of drainage coming from the foot and the nurse recommended him to go to urgent care and then from urgent care they recommended him to go to the ER for admission.  Patient admits pain along the entire left lower extremity.  Patient denies constitutional symptoms of nausea vomiting fever or chills.  Patient does admit to a history of significant rheumatoid arthritis, chronic pain, neuropathy, and heart problems.   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[place]) 12/09/2019   Physical debility 11/21/2019   Syncope 11/21/2019   DDD (degenerative disc disease), lumbar 09/27/2019   Open wound of left foot 09/27/2019   Open wound of right foot 09/27/2019   Pain due to onychomycosis of toenails of both feet 08/28/2019   Acute on chronic HFrEF (heart failure with reduced ejection fraction) ([place]) 08/22/2019   Chronic pain syndrome 08/22/2019   Rheumatoid arthritis ([place]) 08/22/2019   Venous stasis ulcers ([place]) 08/22/2019    Anemia, unspecified 08/22/2019   NSTEMI (non-ST elevated myocardial infarction) ([place]) 08/21/2019   Cervical spondylosis without myelopathy 08/21/2019   Lumbar facet arthropathy 08/14/2019   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   B12 deficiency 02/08/2019   Glucose intolerance (impaired glucose tolerance) 02/08/2019   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   Hyperglycemia 12/27/2018   Cellulitis 12/27/2018   Wound drainage 12/22/2018   Foot ulcer ([place]) 12/21/2018   Encounter for screening for HIV 12/21/2018   Arthritis 08/22/2018   HLD (hyperlipidemia) 08/22/2018   Lung nodule 08/22/2018   Renal cyst, right 08/22/2018   Rheumatoid arthritis involving multiple sites with positive rheumatoid factor ([place]) 08/22/2018   Venous insufficiency 08/22/2018   Vitamin D deficiency 08/22/2018   Seasonal allergic rhinitis 07/16/2018   Essential hypertension 02/23/2018   Insomnia 02/23/2018   Tinea versicolor 02/23/2018   Antalgic gait 09/21/2017   Chronic pain of both knees 09/21/2017   Foot pain, bilateral 09/21/2017   Elevated liver enzymes 07/10/2017   Leg edema 06/08/2017   Primary osteoarthritis, left ankle and foot 05/09/2017   Post-traumatic osteoarthritis, right ankle and foot 05/09/2017   Primary osteoarthritis, right ankle and foot 05/09/2017   Erectile dysfunction 02/15/2017   Excessive sweating 02/15/2017   Difficulty transferring 01/27/2017   Acquired bilateral flat feet 11/15/2016   Other acquired hammer toe 11/15/2016   Erosive gastritis 09/05/2016   At high risk for falls 08/15/2016   Chronic bilateral low back pain without sciatica 10/13/2015   Drug therapy 08/03/2015   Rheumatoid arthritis involving left hip (  [place]) 01/29/2015   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   Current Outpatient Medications on File Prior to Encounter  Medication Sig Dispense Refill   acetaminophen (TYLENOL) 325 MG tablet Take 2 tablets (650 mg total) by mouth every 4 (four) hours as needed for headache or mild pain. 30 tablet 0   aspirin EC 81 MG tablet Take 1 tablet (81 mg total) by mouth daily. 90 tablet 3   atorvastatin (LIPITOR) 40 MG tablet Take 40 mg by mouth daily.     celecoxib (CELEBREX) 200 MG capsule Take by mouth.     cetirizine (ZYRTEC) 10 MG tablet Take 10 mg by mouth daily as needed for allergies.     docusate sodium (COLACE) 100 MG capsule Take 1 capsule (100 mg total) by mouth 2 (two) times daily. (Patient taking differently: Take 100 mg by mouth daily as needed for mild constipation.) 30 capsule 0   fluticasone (FLONASE) 50 MCG/ACT nasal spray Place 1 spray into the nose daily as needed for allergies.     furosemide (LASIX) 40 MG tablet Take 1 tablet (40 mg total) by mouth daily. 30 tablet 0   gabapentin (NEURONTIN) 300 MG capsule Take 300 mg by mouth 2 (two) times daily.      isosorbide mononitrate (IMDUR) 60 MG 24 hr tablet Take 1 tablet (60 mg total) by mouth daily. 30 tablet 0   leflunomide (ARAVA) 10 MG tablet Take 1 tablet by mouth daily.     lisinopril (ZESTRIL) 5 MG tablet Take 10 mg by mouth daily.     metoprolol succinate (TOPROL-XL) 25 MG 24 hr tablet Take 25 mg by mouth daily.     montelukast (SINGULAIR) 10 MG tablet Take 10 mg by mouth at bedtime.     omeprazole (PRILOSEC) 20 MG capsule Take 20 mg by mouth daily.      predniSONE (DELTASONE) 5 MG tablet Take 5 mg by mouth 2 (two) times daily with a meal.     spironolactone (ALDACTONE) 25 MG tablet Take 25 mg by mouth  daily.     triamcinolone (KENALOG) 0.1 % Apply 1 application topically daily as needed (to flaky skin).     [DISCONTINUED] rivaroxaban (XARELTO) 10 MG TABS tablet Take 10 mg by mouth daily with breakfast.      Allergies  Allergen Reactions   Infliximab Nausea Only and Shortness Of Breath    Past Surgical History:  Procedure Laterality Date   COLONOSCOPY N/A 04/03/2013   Procedure: COLONOSCOPY;  Surgeon: Irene Shipper, MD;  Location: [facility];  Service: Endoscopy;  Laterality: N/A;   COLONOSCOPY     ENDOVENOUS ABLATION SAPHENOUS VEIN W/ LASER Left 09/26/2019   endovenous laser ablation left greater saphenous vein by Gae Gallop MD    ENDOVENOUS ABLATION SAPHENOUS VEIN W/ LASER Right 10/31/2019   endovenous laser ablation right greater saphenous vein by Gae Gallop MD    ESOPHAGOGASTRODUODENOSCOPY N/A 04/03/2013   Procedure: ESOPHAGOGASTRODUODENOSCOPY (EGD);  Surgeon: Irene Shipper, MD;  Location: [facility];  Service: Endoscopy;  Laterality: N/A;  changed to moderate dr. Henrene Pastor did not want to go to the o.r. for an endo colon-jmt   EYE SURGERY Left    "when i was a very small boy"   JOINT REPLACEMENT     both knees   KNEE ARTHROPLASTY  06/07/2011   Procedure: COMPUTER ASSISTED TOTAL KNEE ARTHROPLASTY;  Surgeon: Mcarthur Rossetti, MD;  Location: [facility];  Service: Orthopedics;  Laterality: Right;  Right total knee arthoplasty   KNEE CLOSED REDUCTION  07/19/2011   Procedure: CLOSED MANIPULATION KNEE;  Surgeon: Mcarthur Rossetti, MD;  Location: [facility];  Service: Orthopedics;  Laterality: Right;  Manipulation under anesthesia right knee   LEFT HEART CATH AND CORONARY ANGIOGRAPHY N/A 08/21/2019   Procedure: LEFT HEART CATH AND CORONARY ANGIOGRAPHY;  Surgeon: Belva Crome, MD;  Location: [facility];  Service: Cardiovascular;  Laterality: N/A;   RIGHT/LEFT HEART CATH AND CORONARY ANGIOGRAPHY N/A 09/28/2020   Procedure: RIGHT/LEFT HEART CATH AND CORONARY ANGIOGRAPHY;   Surgeon: Nelva Bush, MD;  Location: [facility];  Service: Cardiovascular;  Laterality: N/A;   TOTAL HIP ARTHROPLASTY Right 11/11/2014   Procedure: RIGHT TOTAL HIP ARTHROPLASTY ANTERIOR APPROACH;  Surgeon: Mcarthur Rossetti, MD;  Location: [facility];  Service: Orthopedics;  Laterality: Right;   TOTAL HIP ARTHROPLASTY Left 01/29/2015   Procedure: LEFT TOTAL HIP ARTHROPLASTY ANTERIOR APPROACH;  Surgeon: Mcarthur Rossetti, MD;  Location: [facility];  Service: Orthopedics;  Laterality: Left;    Family History  Problem Relation Age of Onset   Cancer Mother        ?   Hypertension Mother    Heart failure Father        heart transplant   Cancer Brother    Anesthesia problems Neg Hx    Colon cancer Neg Hx        mother had "3" types of CA- unsure about which one   Esophageal cancer Neg Hx    Rectal cancer Neg Hx    Stomach cancer Neg Hx     Social History   Socioeconomic History   Marital status: Single    Spouse name: Not on file   Number of children: Not on file   Years of education: Not on file   Highest education level: Not on file  Occupational History   Occupation: Disabled  Tobacco Use   Smoking status: Former    Pack years: 0.00    Types: Cigarettes    Quit date: 04/25/2004    Years since quitting: 16.4   Smokeless tobacco: Never  Vaping Use   Vaping Use: Never used  Substance and Sexual Activity   Alcohol use: No   Drug use: No   Sexual activity: Never  Other Topics Concern   Not on file  Social History Narrative   Not on file   Social Determinants of Health   Financial Resource Strain: Not on file  Food Insecurity: Not on file  Transportation Needs: Not on file  Physical Activity: Not on file  Stress: Not on file  Social Connections: Not on file  Intimate Partner Violence: Not on file     Objective:  Today's Vitals   10/10/20 0700 10/10/20 0714 10/10/20 0909 10/10/20 0914  BP:    109/64  Pulse:    95  Resp:    18  Temp:    98 F (36.7  C)  TempSrc:    Oral  SpO2:    99%  Weight: 58.5 kg     Height: 5\' 3"  (1.6 m)     PainSc:  5  7     Body mass index is 22.85 kg/m.   General: Alert and oriented x3 in no acute distress  Dermatology: There is a full-thickness ulceration noted to the dorsal aspect of the left foot at the bases of the second through fourth toes the ulceration measures approximately 3 x 2 cm down to the level of fatty tissue with no tendon or bone exposed, there is mild periwound maceration, localized edema, localized erythema, minimal malodor. There is a small nonconcerning abrasion noted to the right medial ankle.  There is no other ulcerations noted bilateral.  Vascular: Dorsalis Pedis and Posterior Tibial pedal pulses not palpable, Capillary Fill Time 5 seconds,(-) pedal hair growth bilateral, Temperature gradient within normal limits bilateral with no increased warmth to the left foot at area of concern.  Neurology: Gross sensation intact via light touch bilateral, patient noted to be having pain as I am examining his left foot.  Musculoskeletal: Mild to moderate tenderness with palpation at left foot.  There is significant digital deformity noted bilateral likely consistent with rheumatoid foot.  Significant limb deformity left greater than right.  Results Xrays Left foot IMPRESSION: 1. Articular irregularity of the first, second and third metatarsal phalangeal joints. This appears progressed at the first and second rays from 2020. Given the underlying chronic changes, recommend MRI for more detailed assessment. 2. Small erosion at the first interphalangeal joint. 3. 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  MRI left foot IMPRESSION: 1. Limited study due to patient motion throughout the exam. 2. Soft tissue ulceration and enhancement within the forefoot at the level of the fourth metatarsal head, with underlying marrow edema and enhancement within the fourth metatarsal. No  cortical disruption. Findings favor early osteomyelitis. 3. Reactive edema within the fifth metatarsal and first digit as above, similar to prior study, and likely related to underlying degenerative change. 4. Nonspecific soft tissue edema within the musculature adjacent to the second and third metatarsals, likely reactive. No fluid collection or abscess.    Results for orders placed or performed during the hospital encounter of 10/09/20  SARS CORONAVIRUS 2 (TAT 6-24 HRS) Nasopharyngeal Nasopharyngeal Swab     Status: None   Collection Time: 10/09/20  9:57 PM   Specimen: Nasopharyngeal Swab  Result Value Ref Range Status   SARS Coronavirus 2 NEGATIVE NEGATIVE Final    Comment: (NOTE) SARS-CoV-2 target nucleic acids are NOT DETECTED.  The SARS-CoV-2 RNA is generally detectable in upper and lower respiratory specimens during the acute phase of infection. Negative results do not preclude SARS-CoV-2 infection, do not rule out co-infections with other pathogens, and should not be used as the sole basis for treatment or other patient management decisions. Negative results must be combined with clinical observations, patient history, and epidemiological information. The expected result is Negative.  Fact Sheet for Patients: SugarRoll.be  Fact Sheet for Healthcare Providers: https://www.woods-mathews.com/  This test is not yet approved or cleared by the [place] FDA and  has been authorized for detection and/or diagnosis of SARS-CoV-2 by FDA under an Emergency Use Authorization (EUA). This EUA will remain  in effect (meaning this test can be used) for the duration of the COVID-19 declaration under Se ction 564(b)(1) of the Act, 21 U.S.C. section 360bbb-3(b)(1), unless the authorization is terminated or revoked sooner.  Performed at [facility], [place] [address] [place], [place]  [place]      Assessment and Plan: Hospital  Problem List as of 10/10/2020          Priority Resolved POA     Unprioritized     * (Principal) Acute osteomyelitis of ankle and foot ([place])   Unknown     Rheumatoid arthritis involving multiple sites with positive rheumatoid factor (HCC)   Yes     Coronary artery disease involving native coronary artery of native heart   Yes     Chronic foot ulcer with fat layer exposed, left (HCC)   Unknown      -Complete examination performed -Chart reviewed -Dressing change performed; applied Xeroform and dry dressing to the left foot; nursing orders are in place to do the same -Discussed treatment options for concerning osteomyelitis on MRI at fourth metatarsal head and left foot wound -Patient agreeable for surgical management.  Planning for OR on tomorrow 10/11/2020.  Consent to be obtained for left foot removal of fourth metatarsal head with irrigation and debridement of wound and culture with possible graft. Pre and Post op course explained. Risks, benefits, alternatives explained. No guarantees given or implied.  Discussed surgical plan over the phone with patient's cousin of whom he lives with.  Answered all questions to satisfaction. -Patient is well aware that there is risk for loss of toes, infection and worsen of symptoms, possible need for further surgery, amputation/loss of limb or life  -Rx ABI/vascular testing and advised patient if this is abnormal he may need during this hospital admission further vascular work-up in order to assist with healing -Recommend rest and elevation for pain and edema control -Recommend continue with medical management and IV antibiotics -Patient to be weightbearing as tolerated with crutches/assistive device that is required because of his rheumatoid arthritis -Consult appreciated -Podiatry to follow   Dr. Landis Martins, [place] [facility] [name] [phone] office [phone] cell  Time spent with patient for exam and coordination of care: 40  mins

## 2020-10-11 ENCOUNTER — Inpatient Hospital Stay (HOSPITAL_COMMUNITY): Payer: Medicare Other | Admitting: Certified Registered Nurse Anesthetist

## 2020-10-11 ENCOUNTER — Encounter (HOSPITAL_COMMUNITY)
Admission: EM | Disposition: A | Discharge status: Rehabilitation Facility | Payer: Self-pay | Source: Home / Self Care | Attending: Neurology

## 2020-10-11 DIAGNOSIS — M86672 Other chronic osteomyelitis, left ankle and foot: Secondary | ICD-10-CM

## 2020-10-11 HISTORY — PX: METATARSAL HEAD EXCISION: SHX5027

## 2020-10-11 HISTORY — PX: INCISION AND DRAINAGE OF WOUND: SHX1803

## 2020-10-11 HISTORY — PX: GRAFT APPLICATION: SHX6696

## 2020-10-11 LAB — CBC
HCT: 28 % — ABNORMAL LOW (ref 39.0–52.0)
Hemoglobin: 8.8 g/dL — ABNORMAL LOW (ref 13.0–17.0)
MCH: 24.8 pg — ABNORMAL LOW (ref 26.0–34.0)
MCHC: 31.4 g/dL (ref 30.0–36.0)
MCV: 78.9 fL — ABNORMAL LOW (ref 80.0–100.0)
Platelets: 272 10*3/uL (ref 150–400)
RBC: 3.55 MIL/uL — ABNORMAL LOW (ref 4.22–5.81)
RDW: 15.8 % — ABNORMAL HIGH (ref 11.5–15.5)
WBC: 9.2 10*3/uL (ref 4.0–10.5)
nRBC: 0 % (ref 0.0–0.2)

## 2020-10-11 LAB — CREATININE, SERUM
Creatinine, Ser: 0.55 mg/dL — ABNORMAL LOW (ref 0.61–1.24)
GFR, Estimated: >60 mL/min (ref >60)

## 2020-10-11 LAB — RENAL FUNCTION PANEL
Albumin: 2.9 g/dL — ABNORMAL LOW (ref 3.5–5.0)
Anion gap: 6 (ref 5–15)
BUN: 8 mg/dL (ref 6–20)
CO2: 25 mmol/L (ref 22–32)
Calcium: 9.2 mg/dL (ref 8.9–10.3)
Chloride: 102 mmol/L (ref 98–111)
Creatinine, Ser: 0.68 mg/dL (ref 0.61–1.24)
GFR, Estimated: >60 mL/min (ref >60)
Glucose, Bld: 110 mg/dL — ABNORMAL HIGH (ref 70–99)
Phosphorus: 2.4 mg/dL — ABNORMAL LOW (ref 2.5–4.6)
Potassium: 3.7 mmol/L (ref 3.5–5.1)
Sodium: 133 mmol/L — ABNORMAL LOW (ref 135–145)

## 2020-10-11 LAB — MAGNESIUM: Magnesium: 1.7 mg/dL (ref 1.7–2.4)

## 2020-10-11 SURGERY — EXCISION, METATARSAL BONE, HEAD
Anesthesia: Monitor Anesthesia Care | Site: Toe | Laterality: Left

## 2020-10-11 MED ORDER — BUPIVACAINE HCL (PF) 0.5 % IJ SOLN
INTRAMUSCULAR | Status: DC | PRN
Start: 2020-10-11 — End: 2020-10-11
  Administered 2020-10-11: 10 mL
  Administered 2020-10-11: 5 mL

## 2020-10-11 MED ORDER — FENTANYL CITRATE (PF) 100 MCG/2ML IJ SOLN: 25.0000 ug | INTRAMUSCULAR | Status: DC | PRN

## 2020-10-11 MED ORDER — LIDOCAINE HCL 1 % IJ SOLN
INTRAMUSCULAR | Status: DC | PRN
  Administered 2020-10-11: 10 mL
  Administered 2020-10-11: 5 mL

## 2020-10-11 MED ORDER — PROPOFOL 500 MG/50ML IV EMUL
INTRAVENOUS | Status: DC | PRN
  Administered 2020-10-11: 100 ug/kg/min via INTRAVENOUS

## 2020-10-11 MED ORDER — 0.9 % SODIUM CHLORIDE (POUR BTL) OPTIME
TOPICAL | Status: DC | PRN
  Administered 2020-10-11: 1000 mL

## 2020-10-11 MED ORDER — LACTATED RINGERS IV SOLN: INTRAVENOUS | Status: DC | PRN

## 2020-10-11 MED ORDER — LIDOCAINE HCL (PF) 1 % IJ SOLN
INTRAMUSCULAR | Status: AC
  Filled 2020-10-11: qty 30

## 2020-10-11 MED ORDER — PROPOFOL 1000 MG/100ML IV EMUL
INTRAVENOUS | Status: AC
  Filled 2020-10-11: qty 100

## 2020-10-11 SURGICAL SUPPLY — 47 items
APL PRP STRL LF DISP 70% ISPRP (MISCELLANEOUS) ×2
APL SKNCLS STERI-STRIP NONHPOA (GAUZE/BANDAGES/DRESSINGS) ×2
BAG COUNTER SPONGE SURGICOUNT (BAG) IMPLANT
BAG SPEC THK2 15X12 ZIP CLS (MISCELLANEOUS) ×2
BAG SPNG CNTER NS LX DISP (BAG)
BAG SURGICOUNT SPONGE COUNTING (BAG)
BAG ZIPLOCK 12X15 (MISCELLANEOUS) ×4 IMPLANT
BENZOIN TINCTURE PRP APPL 2/3 (GAUZE/BANDAGES/DRESSINGS) ×4 IMPLANT
BNDG COHESIVE 6X5 TAN STRL LF (GAUZE/BANDAGES/DRESSINGS) ×8 IMPLANT
BNDG ELASTIC 6X5.8 VLCR STR LF (GAUZE/BANDAGES/DRESSINGS) ×4 IMPLANT
BNDG GAUZE ELAST 4 BULKY (GAUZE/BANDAGES/DRESSINGS) ×4 IMPLANT
BUR OVAL CARBIDE 4.0 (BURR) ×6 IMPLANT
CHLORAPREP W/TINT 26 (MISCELLANEOUS) ×4 IMPLANT
CLOSURE WOUND 1/2 X4 (GAUZE/BANDAGES/DRESSINGS) ×1
COVER SURGICAL LIGHT HANDLE (MISCELLANEOUS) ×4 IMPLANT
CUFF TOURN SGL QUICK 34 (TOURNIQUET CUFF) ×4
CUFF TRNQT CYL 34X4.125X (TOURNIQUET CUFF) ×2 IMPLANT
DRAPE OEC MINIVIEW 54X84 (DRAPES) ×2 IMPLANT
DRSG EMULSION OIL 3X3 NADH (GAUZE/BANDAGES/DRESSINGS) ×4 IMPLANT
DRSG PAD ABDOMINAL 8X10 ST (GAUZE/BANDAGES/DRESSINGS) ×6 IMPLANT
ELECT REM PT RETURN 15FT ADLT (MISCELLANEOUS) ×4 IMPLANT
GAUZE SPONGE 4X4 12PLY STRL (GAUZE/BANDAGES/DRESSINGS) ×6 IMPLANT
GLOVE SURG ENC MOIS LTX SZ6.5 (GLOVE) ×4 IMPLANT
GLOVE SURG UNDER POLY LF SZ6.5 (GLOVE) ×4 IMPLANT
GOWN STRL REUS W/ TWL LRG LVL3 (GOWN DISPOSABLE) ×2 IMPLANT
GOWN STRL REUS W/TWL LRG LVL3 (GOWN DISPOSABLE) ×4
KIT BASIN OR (CUSTOM PROCEDURE TRAY) ×4 IMPLANT
KIT TURNOVER KIT A (KITS) ×4 IMPLANT
MANIFOLD NEPTUNE II (INSTRUMENTS) ×4 IMPLANT
MATRIX WOUND 3-LAYER 5X5 (Tissue) ×1 IMPLANT
NDL HYPO 25X1 1.5 SAFETY (NEEDLE) ×4 IMPLANT
NEEDLE HYPO 25X1 1.5 SAFETY (NEEDLE) ×8 IMPLANT
NS IRRIG 1000ML POUR BTL (IV SOLUTION) ×4 IMPLANT
PACK ORTHO EXTREMITY (CUSTOM PROCEDURE TRAY) ×4 IMPLANT
PROBE DEBRIDE SONICVAC MISONIX (TIP) ×4 IMPLANT
PROTECTOR NERVE ULNAR (MISCELLANEOUS) ×4 IMPLANT
STAPLER VISISTAT (STAPLE) ×4 IMPLANT
STRIP CLOSURE SKIN 1/2X4 (GAUZE/BANDAGES/DRESSINGS) ×3 IMPLANT
SUT VIC AB 2-0 CT1 27 (SUTURE) ×4
SUT VIC AB 2-0 CT1 TAPERPNT 27 (SUTURE) ×2 IMPLANT
SWAB COLLECTION DEVICE MRSA (MISCELLANEOUS) ×4 IMPLANT
SWAB CULTURE ESWAB REG 1ML (MISCELLANEOUS) ×4 IMPLANT
SYR CONTROL 10ML LL (SYRINGE) ×8 IMPLANT
TOWEL OR 17X26 10 PK STRL BLUE (TOWEL DISPOSABLE) ×4 IMPLANT
TUBE IRRIGATION SET MISONIX (TUBING) ×4 IMPLANT
WATER STERILE IRR 1000ML POUR (IV SOLUTION) ×4 IMPLANT
WOUND MATRIX 3-LAYER 5X5 (Tissue) ×1 IMPLANT

## 2020-10-11 NOTE — Plan of Care (Signed)
  Problem: Coping: Goal: Level of anxiety will decrease Outcome: Progressing   Problem: Pain Managment: Goal: General experience of comfort will improve Outcome: Progressing   

## 2020-10-11 NOTE — Interval H&P Note (Signed)
Anesthesia H&P Update: History and Physical Exam reviewed; patient is OK for planned anesthetic and procedure. ? ?

## 2020-10-11 NOTE — Anesthesia Preprocedure Evaluation (Addendum)
Anesthesia Evaluation  Patient identified by MRN, date of birth, ID band Patient awake    Reviewed: Allergy & Precautions, NPO status , Patient's Chart, lab work & pertinent test results, reviewed documented beta blocker date and time   Airway Mallampati: III  TM Distance: >3 FB Neck ROM: Full    Dental  (+) Edentulous Lower, Edentulous Upper, Dental Advisory Given   Pulmonary neg pulmonary ROS, former smoker,    Pulmonary exam normal breath sounds clear to auscultation       Cardiovascular hypertension, Pt. on home beta blockers and Pt. on medications + CAD, + Past MI and +CHF  Normal cardiovascular exam+ Valvular Problems/Murmurs AI  Rhythm:Regular Rate:Normal  TTE 2022 1. Left ventricular ejection fraction, by estimation, is 30 to 35%. The  left ventricle has moderately decreased function. The left ventricle  demonstrates regional wall motion abnormalities. Inferior/inferoseptal  akinesis. The left ventricular internal  cavity size was mildly dilated. Left ventricular diastolic parameters are  consistent with Grade II diastolic dysfunction (pseudonormalization).  Elevated left atrial pressure.  2. Right ventricular systolic function is normal. The right ventricular  size is normal. Tricuspid regurgitation signal is inadequate for assessing  PA pressure.  3. Left atrial size was mildly dilated.  4. The aortic valve was not well visualized. Aortic valve regurgitation  is mild to moderate. Eccentric posterior directed AI jet, visually appears  mild but may be underestimating given eccentric jet, moderate by PHT. No  aortic stenosis is present.  5. The inferior vena cava is normal in size with greater than 50%  respiratory variability, suggesting right atrial pressure of 3 mmHg.  6. The mitral valve is normal in structure. No evidence of mitral valve  regurgitation.   LHC 2022 1. Severe single-vessel coronary artery  disease with chronic total occlusion of the mid RCA.  There is mild disease involving the left coronary artery.  Overall appearance is similar to 2019. 2. Mildly to moderately elevated left heart filling pressure. 3. Normal cardiac output/index.     Neuro/Psych negative neurological ROS  negative psych ROS   GI/Hepatic Neg liver ROS, PUD,   Endo/Other  negative endocrine ROS  Renal/GU negative Renal ROS  negative genitourinary   Musculoskeletal  (+) Arthritis , Rheumatoid disorders,    Abdominal   Peds  Hematology  (+) Blood dyscrasia (Hgb 8.Marland Kitchen8), anemia ,   Anesthesia Other Findings Presented on 10/09/20 with progressive worsening of his left foot ulcer  Reproductive/Obstetrics                           Anesthesia Physical Anesthesia Plan  ASA: 3  Anesthesia Plan: MAC   Post-op Pain Management:    Induction: Intravenous  PONV Risk Score and Plan: 1 and Propofol infusion, Treatment may vary due to age or medical condition, Midazolam and Ondansetron  Airway Management Planned: Natural Airway  Additional Equipment:   Intra-op Plan:   Post-operative Plan:   Informed Consent: I have reviewed the patients History and Physical, chart, labs and discussed the procedure including the risks, benefits and alternatives for the proposed anesthesia with the patient or authorized representative who has indicated his/her understanding and acceptance.     Dental advisory given  Plan Discussed with: CRNA  Anesthesia Plan Comments:         Anesthesia Quick Evaluation

## 2020-10-11 NOTE — Progress Notes (Signed)
Pt down to PACU in stable condition. No needs at time of transport.

## 2020-10-11 NOTE — Plan of Care (Signed)
  Problem: Clinical Measurements: Goal: Respiratory complications will improve Outcome: Progressing   Problem: Clinical Measurements: Goal: Cardiovascular complication will be avoided Outcome: Progressing   Problem: Elimination: Goal: Will not experience complications related to bowel motility Outcome: Progressing   Problem: Elimination: Goal: Will not experience complications related to urinary retention Outcome: Progressing   Problem: Skin Integrity: Goal: Risk for impaired skin integrity will decrease Outcome: Progressing   

## 2020-10-11 NOTE — Progress Notes (Signed)
PROGRESS NOTE  Caylin Raby UVO:[id number] DOB: 31-Mar-1961   PCP: System, Provider Not In  Patient is from: Home.  Lives with family.  Reports using crutches at baseline.  DOA: 10/09/2020 LOS: 2  Chief complaints: Chief Complaint  Patient presents with   Wound Infection     Brief Narrative / Interim history: 60 year old M with PMH of RA, systolic CHF, chronic venous stasis with chronic left foot ulcer and recent diagnosis of onychomycosis s/p debridement by his podiatrist presenting with worsening chronic left foot ulcer, and admitted for infected left foot ulcer and possible osteomyelitis on x-ray and MRI.  He was a started on broad-spectrum antibiotics-Zosyn and vancomycin.  Podiatry consulted the next day.  Patient underwent fourth metatarsal head excision, irrigation and debridement and graft application on 7/42.  Subjective: Seen and examined earlier this morning before he went down for surgery.  No major events overnight of this morning.  He reports "some" pain in his left foot.  Denies chest pain, shortness of breath, GI or UTI symptoms.  Objective: Vitals:   10/11/20 1200 10/11/20 1215 10/11/20 1220 10/11/20 1236  BP: 101/63 (!) 99/59  106/60  Pulse: 92 87 87 88  Resp: (!) 6 (!) 24 (!) 22 18  Temp: 97.8 F (36.6 C)   98 F (36.7 C)  TempSrc:    Oral  SpO2: 98% 100% 95% 98%  Weight:      Height:        Intake/Output Summary (Last 24 hours) at 10/11/2020 1250 Last data filed at 10/11/2020 1135 Gross per 24 hour  Intake 2723.8 ml  Output 3500 ml  Net -776.2 ml   Filed Weights   10/10/20 0700 10/11/20 0500  Weight: 58.5 kg 58 kg    Examination:  GENERAL: Frail looking elderly male.  Nontoxic. HEENT: MMM.  Vision and hearing grossly intact.  NECK: Supple.  No apparent JVD.  RESP: On RA.  No IWOB.  Fair aeration bilaterally. CVS:  RRR. Heart sounds normal.  ABD/GI/GU: BS+. Abd soft, NTND.  MSK/EXT: Deformities from RA.  Chronic venous insufficiency.  2+  DP pulses bilaterally. SKIN: Dressing over left foot DCI.  No swelling or tenderness proximally. NEURO: Awake and alert. Oriented appropriately.  No apparent focal neuro deficit. PSYCH: Calm. Normal affect.   Procedures:  6/19-fourth metatarsal head excision, irrigation and debridement and graft application by Dr. Cannon Kettle  Microbiology summarized: COVID-19 PCR nonreactive.  Assessment & Plan: Infection of chronic left foot ulcer with concern for osteomyelitis: X-ray and MRI concerning for osteomyelitis.  Patient is immunocompromised.  No history of diabetes.  Good DP pulses bilaterally but some chronic venous insufficiency -S/p fourth metatarsal head excision, irrigation and debridement and graft application by Dr. Cannon Kettle -Continue vancomycin and Zosyn -Follow surgical tissue culture -We will consult ID after culture results.  Chronic combined CHF: TTE earlier this month with LVEF of 30 to 35%, R WMA, G2-DD.  Appears euvolemic except for his chronic venous insufficiency.  No cardiopulmonary symptoms. -Continue home Lasix, Aldactone, Imdur, metoprolol and lisinopril -Monitor fluid and respiratory status -Monitor renal functions and electrolytes -Sodium and fluid restriction   Essential hypertension: Normotensive. -Continue cardiac meds as above  Rheumatoid arthritis: deformities in his hands and feet. -Continue home Arava and prednisone. -May need stress dose steroid if surgery.  Mild hyponatremia: Due to Aldactone? -Continue monitoring  Hyperlipidemia: Continue Lipitor 40 mg daily   Venous stasis with ulcers: Continue aspirin    Chronic pain syndrome: On meloxicam and gabapentin.   -Continue  home gabapentin   Anemia of chronic disease: H&H stable. Recent Labs    09/07/20 0644 09/26/20 0133 09/26/20 0216 09/27/20 0138 09/28/20 0519 09/28/20 1051 09/29/20 0447 10/09/20 1859 10/10/20 0351 10/11/20 0409  HGB 9.9* 9.8* 9.9* 10.0* 9.4* 9.9*  10.2* 10.2* 8.8* 9.1* 8.8*   -Monitor  Debility/physical deconditioning: Reports using crutches at baseline. -PT/OT eval    Body mass index is 22.65 kg/m.         DVT prophylaxis:  enoxaparin (LOVENOX) injection 40 mg Start: 10/10/20 1000  Code Status: Full code Family Communication: Patient and/or RN. Available if any question.  Level of care: Med-Surg Status is: Inpatient  Remains inpatient appropriate because:Ongoing diagnostic testing needed not appropriate for outpatient work up, IV treatments appropriate due to intensity of illness or inability to take PO, and Inpatient level of care appropriate due to severity of illness  Dispo: The patient is from: Home              Anticipated d/c is to: Home              Patient currently is not medically stable to d/c.   Difficult to place patient No       Consultants:  Podiatry   Sch Meds:  Scheduled Meds:  aspirin EC  81 mg Oral Daily   atorvastatin  40 mg Oral Daily   docusate sodium  100 mg Oral BID   enoxaparin (LOVENOX) injection  40 mg Subcutaneous Q24H   furosemide  40 mg Oral Daily   gabapentin  300 mg Oral BID   isosorbide mononitrate  60 mg Oral Daily   lisinopril  5 mg Oral Daily   metoprolol succinate  100 mg Oral Daily   montelukast  10 mg Oral QHS   predniSONE  5 mg Oral BID WC   spironolactone  25 mg Oral Daily   Continuous Infusions:  sodium chloride 100 mL/hr at 10/10/20 2010   piperacillin-tazobactam (ZOSYN)  IV 3.375 g (10/11/20 0305)   vancomycin 1,750 mg (10/10/20 2029)   PRN Meds:.fluticasone, morphine injection, ondansetron **OR** ondansetron (ZOFRAN) IV  Antimicrobials: Anti-infectives (From admission, onward)    Start     Dose/Rate Route Frequency Ordered Stop   10/10/20 2200  vancomycin (VANCOREADY) IVPB 1750 mg/350 mL        1,750 mg 175 mL/hr over 120 Minutes Intravenous Every 24 hours 10/09/20 2227     10/10/20 0400  piperacillin-tazobactam (ZOSYN) IVPB 3.375 g        3.375 g 12.5 mL/hr over 240 Minutes  Intravenous Every 8 hours 10/09/20 2155     10/09/20 2200  vancomycin (VANCOCIN) IVPB 1000 mg/200 mL premix        1,000 mg 200 mL/hr over 60 Minutes Intravenous  Once 10/09/20 2155 10/10/20 0841   10/09/20 2000  piperacillin-tazobactam (ZOSYN) IVPB 3.375 g        3.375 g 100 mL/hr over 30 Minutes Intravenous  Once 10/09/20 1950 10/09/20 2111        I have personally reviewed the following labs and images: CBC: Recent Labs  Lab 10/09/20 1859 10/10/20 0351 10/11/20 0409  WBC 6.0 4.7 9.2  NEUTROABS 3.6  --   --   HGB 8.8* 9.1* 8.8*  HCT 28.9* 29.7* 28.0*  MCV 81.0 80.7 78.9*  PLT 293 250 272   BMP &GFR Recent Labs  Lab 10/09/20 1859 10/10/20 0351 10/11/20 0409  NA 141 141 133*  K 3.7 3.6 3.7  CL 109 110 102  CO2 25 27 25   GLUCOSE 91 106* 110*  BUN 12 10 8   CREATININE 0.67 0.57* 0.68  0.55*  CALCIUM 9.8 9.5 9.2  MG  --   --  1.7  PHOS  --   --  2.4*   Estimated Creatinine Clearance: 80 mL/min (A) (by C-G formula based on SCr of 0.55 mg/dL (L)). Liver & Pancreas: Recent Labs  Lab 10/10/20 0351 10/11/20 0409  AST 19  --   ALT 11  --   ALKPHOS 80  --   BILITOT 0.4  --   PROT 6.5  --   ALBUMIN 2.9* 2.9*   No results for input(s): LIPASE, AMYLASE in the last 168 hours. No results for input(s): AMMONIA in the last 168 hours. Diabetic: No results for input(s): HGBA1C in the last 72 hours. No results for input(s): GLUCAP in the last 168 hours. Cardiac Enzymes: No results for input(s): CKTOTAL, CKMB, CKMBINDEX, TROPONINI in the last 168 hours. No results for input(s): PROBNP in the last 8760 hours. Coagulation Profile: No results for input(s): INR, PROTIME in the last 168 hours. Thyroid Function Tests: No results for input(s): TSH, T4TOTAL, FREET4, T3FREE, THYROIDAB in the last 72 hours. Lipid Profile: No results for input(s): CHOL, HDL, LDLCALC, TRIG, CHOLHDL, LDLDIRECT in the last 72 hours. Anemia Panel: No results for input(s): VITAMINB12, FOLATE,  FERRITIN, TIBC, IRON, RETICCTPCT in the last 72 hours. Urine analysis:    Component Value Date/Time   COLORURINE YELLOW 07/09/2013 1801   APPEARANCEUR CLEAR 07/09/2013 1801   LABSPEC 1.023 07/09/2013 1801   PHURINE 5.5 07/09/2013 1801   GLUCOSEU NEGATIVE 07/09/2013 1801   HGBUR NEGATIVE 07/09/2013 1801   BILIRUBINUR SMALL (A) 07/09/2013 1801   KETONESUR 40 (A) 07/09/2013 1801   PROTEINUR NEGATIVE 07/09/2013 1801   UROBILINOGEN 0.2 07/09/2013 1801   NITRITE NEGATIVE 07/09/2013 1801   LEUKOCYTESUR NEGATIVE 07/09/2013 1801   Sepsis Labs: Invalid input(s): PROCALCITONIN, [place]  Microbiology: Recent Results (from the past 240 hour(s))  SARS CORONAVIRUS 2 (TAT 6-24 HRS) Nasopharyngeal Nasopharyngeal Swab     Status: None   Collection Time: 10/09/20  9:57 PM   Specimen: Nasopharyngeal Swab  Result Value Ref Range Status   SARS Coronavirus 2 NEGATIVE NEGATIVE Final    Comment: (NOTE) SARS-CoV-2 target nucleic acids are NOT DETECTED.  The SARS-CoV-2 RNA is generally detectable in upper and lower respiratory specimens during the acute phase of infection. Negative results do not preclude SARS-CoV-2 infection, do not rule out co-infections with other pathogens, and should not be used as the sole basis for treatment or other patient management decisions. Negative results must be combined with clinical observations, patient history, and epidemiological information. The expected result is Negative.  Fact Sheet for Patients: SugarRoll.be  Fact Sheet for Healthcare Providers: https://www.woods-mathews.com/  This test is not yet approved or cleared by the [place] FDA and  has been authorized for detection and/or diagnosis of SARS-CoV-2 by FDA under an Emergency Use Authorization (EUA). This EUA will remain  in effect (meaning this test can be used) for the duration of the COVID-19 declaration under Se ction 564(b)(1) of the Act, 21  U.S.C. section 360bbb-3(b)(1), unless the authorization is terminated or revoked sooner.  Performed at [facility], [place] [address], [place], [place] [address]   Surgical pcr screen     Status: None   Collection Time: 10/10/20  3:34 PM   Specimen: Nasal Mucosa; Nasal Swab  Result Value Ref Range Status   MRSA, PCR NEGATIVE  NEGATIVE Final   Staphylococcus aureus NEGATIVE NEGATIVE Final    Comment: (NOTE) The Xpert SA Assay (FDA approved for NASAL specimens in patients 62 years of age and older), is one component of a comprehensive surveillance program. It is not intended to diagnose infection nor to guide or monitor treatment. Performed at [facility], [place] [address] [place], [place] [address]     Radiology Studies: VAS [place] ABI WITH/WO TBI  Result Date: 10/10/2020  LOWER EXTREMITY DOPPLER STUDY Patient Name:  ELLSWORTH WALDSCHMIDT  Date of Exam:   10/10/2020 Medical Rec #: [id number]         Accession #:    [id number] Date of Birth: Sep 15, 1960        Patient Gender: M Patient Age:   59Y Exam Location:  [place] Procedure:      VAS [place] ABI WITH/WO TBI Referring Phys: [id number] TITORYA STOVER --------------------------------------------------------------------------------  Indications: Ulceration, and acute osteomyelitis- LLE ankle & foot. High Risk Factors: Past history of smoking, coronary artery disease. Other Factors: CHF, chronic venous stasis, chronic venous ulcerations.  Vascular Interventions: Venous ablation surgeries. Comparison Study: Segmental Pressure 11/28/18 - WNL Performing Technologist: Rogelia Rohrer RVT, RDMS  Examination Guidelines: A complete evaluation includes at minimum, Doppler waveform signals and systolic blood pressure reading at the level of bilateral brachial, anterior tibial, and posterior tibial arteries, when vessel segments are accessible. Bilateral testing is considered an integral part of a complete examination. Photoelectric  Plethysmograph (PPG) waveforms and toe systolic pressure readings are included as required and additional duplex testing as needed. Limited examinations for reoccurring indications may be performed as noted.  ABI Findings: +--------+------------------+-----+---------+--------+ Right   Rt Pressure (mmHg)IndexWaveform Comment  +--------+------------------+-----+---------+--------+ Brachial                       triphasicIV       +--------+------------------+-----+---------+--------+ PTA     140               1.28 triphasic         +--------+------------------+-----+---------+--------+ DP      145               1.33 triphasic         +--------+------------------+-----+---------+--------+ +--------+------------------+-----+---------+-------+ Left    Lt Pressure (mmHg)IndexWaveform Comment +--------+------------------+-----+---------+-------+ [id number]                    triphasic        +--------+------------------+-----+---------+-------+ PTA     146               1.34 triphasic        +--------+------------------+-----+---------+-------+ DP      140               1.28 triphasic        +--------+------------------+-----+---------+-------+ +-------+-----------+-----------+------------+------------+ ABI/TBIToday's ABIToday's TBIPrevious ABIPrevious TBI +-------+-----------+-----------+------------+------------+ Right  1.33                  0.98        0.79         +-------+-----------+-----------+------------+------------+ Left   1.34                  1.01        0.75         +-------+-----------+-----------+------------+------------+  Toes bandaged on LLE.  Summary: BLE ABIs are elevated, likely due to vessel calcification. Doppler waveforms are triphasic throughout.  *See table(s) above for measurements and observations  Electronically signed by Harold Barban MD on 10/10/2020 at 11:35:27 PM.    Final        Lazarus Sudbury T. [place]  If  7PM-7AM, please contact night-coverage [email or web address] 10/11/2020, 12:50 PM

## 2020-10-11 NOTE — Anesthesia Postprocedure Evaluation (Signed)
Anesthesia Post Note  Patient: Jonathan [name]  Procedure(s) Performed: METATARSAL HEAD EXCISION (Left: Toe) IRRIGATION AND DEBRIDEMENT WOUND (Left) GRAFT APPLICATION (Left)     Patient location during evaluation: PACU Anesthesia Type: MAC Level of consciousness: awake and alert Pain management: pain level controlled Vital Signs Assessment: post-procedure vital signs reviewed and stable Respiratory status: spontaneous breathing, nonlabored ventilation, respiratory function stable and patient connected to nasal cannula oxygen Cardiovascular status: stable and blood pressure returned to baseline Postop Assessment: no apparent nausea or vomiting Anesthetic complications: no   No notable events documented.  Last Vitals:  Vitals:   10/11/20 1145 10/11/20 1200  BP: 106/61 101/63  Pulse: 90 92  Resp: (!) 25 (!) 6  Temp: 36.5 C 36.6 C  SpO2: 100% 98%    Last Pain:  Vitals:   10/11/20 1145  TempSrc:   PainSc: 0-No pain                 Trapper Meech L Sharyn Brilliant

## 2020-10-11 NOTE — H&P (Signed)
Brief Podiatry H&P Patient visited this AM at bedside. Patient confirms NPO since midnight. Patient consented for Left foot 4th metatarsal head removal, wound debridement with culture and possible graft. All questions answered. Risks and benefits of procedure again explained. No guarantees given or implied. After surgery I will call his family to update them on his status. -Dr. Cannon Kettle

## 2020-10-11 NOTE — Transfer of Care (Signed)
Immediate Anesthesia Transfer of Care Note  Patient: Jonathan [name]  Procedure(s) Performed: METATARSAL HEAD EXCISION (Left: Toe) IRRIGATION AND DEBRIDEMENT WOUND (Left) GRAFT APPLICATION (Left)  Patient Location: PACU  Anesthesia Type:MAC  Level of Consciousness: sedated  Airway & Oxygen Therapy: Patient Spontanous Breathing and Patient connected to face mask oxygen  Post-op Assessment: Report given to RN and Post -op Vital signs reviewed and stable  Post vital signs: Reviewed and stable  Last Vitals:  Vitals Value Taken Time  BP    Temp    Pulse 90 10/11/20 1143  Resp 25 10/11/20 1143  SpO2 100 % 10/11/20 1143  Vitals shown include unvalidated device data.  Last Pain:  Vitals:   10/11/20 0737  TempSrc:   PainSc: 0-No pain      Patients Stated Pain Goal: 2 (37/10/62 6948)  Complications: No notable events documented.

## 2020-10-11 NOTE — Brief Op Note (Signed)
10/11/2020  11:44 AM  PATIENT:  Jonathan [name]  60 y.o. male  PRE-OPERATIVE DIAGNOSIS:  osteomyelitis, nonhealing left foot ulcer  POST-OPERATIVE DIAGNOSIS:  * No post-op diagnosis entered *  PROCEDURE:  Procedure(s) with comments: METATARSAL HEAD EXCISION (Left) - 4th metatarsal IRRIGATION AND DEBRIDEMENT WOUND (Left) GRAFT APPLICATION (Left) - [employer] (rep already contacted: Matt Sides)  SURGEON:  Surgeon(s) and Role:    * Jerrell Mangel, Training and development officer, DPM - Primary  PHYSICIAN ASSISTANT:   ASSISTANTS: none   ANESTHESIA:   MAC  EBL:  20cc   BLOOD ADMINISTERED:none  DRAINS: none   LOCAL MEDICATIONS USED:  MARCAINE    and LIDOCAINE   SPECIMEN:  Source of Specimen:  Left 4th metatarsal head to pathology and deep wound culture swab to micro  DISPOSITION OF SPECIMEN:  PATHOLOGY  COUNTS:  YES  TOURNIQUET:   Total Tourniquet Time Documented: Calf (Left) - 43 minutes Total: Calf (Left) - 43 minutes   DICTATION: .Note written in EPIC  PLAN OF CARE:  Return to medical floor  PATIENT DISPOSITION:  PACU - hemodynamically stable.   Delay start of Pharmacological VTE agent (>24hrs) due to surgical blood loss or risk of bleeding: no

## 2020-10-11 NOTE — Op Note (Signed)
DATE:10-11-20  SURGEON: Landis Martins, DPM  PREOPERATIVE DIAGNOSIS:  Left foot ulcer history of suspected osteomyelitis on MRI at 4th metatarsal head, Rheumatoid foot deformity  POSTOPERATIVE DIAGNOSIS: Same  PROCEDURE PERFORMED: Left foot incision and drainage with debridement, 4th met head resection, placement of graft.  HEMOSTASIS:  Left ankle tourniquet  ESTIMATED BLOOD LOSS:  20cc  ANESTHESIA:  MAC with local 30cc of 1:1 mixture 1% lidocaine and 0.5% marcaine plain  SPECIMENS:  Bone left foot sent to pathology and wound culture swabs to pathology   COMPLICATIONS:  None.  INDICATIONS FOR PROCEDURE:  This patient is a pleasant 60 y.o. male who was consulted to me by medicine for osteomyelitis and worsening left foot infection and wound,  Risks and complications include but are not limited to infection, recurrence of symptoms, pain, numbness, wound dehiscence, failure of graft, delayed healing, as well as need for future surgery/amputation.  No guarantees were given or applied.  All questions were answered to the patient's satisfaction, and the patient has consented to the above procedure.  All preoperative labs and H&P, medical clearances have been obtained and NPO status past midnight has been confirmed.  PROCEDURE IN DETAIL:  Under mild sedation the patient was brought into the operating room and placed on operating table in supine position.  A well-padded right pneumatic ankle tourniquet was placed about the patient's right ankle.  After adequate anesthesia and a local block consisting of 1:1 mixture of 1% lidocaine plain and 0.5% Marcaine plain in a local field block fashion the left foot was then scrubbed prepped and draped in the usual aseptic manner.  The lrgy limb was then elevated and the tourniquet was inflated attention was directed to the left foot where there is of note a dorsal ulceration that measures approximately 2 x 3 cm with a granular base and exposed fatty tissue.   Attention was redirected to the dorsal aspect of the foot where a linear incision was placed over the 4th metatarsal phalangeal joint care was taken to protect and retract all vital neurovascular structures once down to the level of the joint capsule from the dorsal aspect the capsule was then further reflected to reveal the 4th metatarsal head at his operative site then utilizing a sagittal saw the metatarsal head was transected to the level of the surgical neck and removed and sent to pathology for culture.  The 4th metatarsophalangeal joint was irrigated utilizing saline pulse lavage and any nonviable tissue was removed using a combination of sharp and blunt dissection of tenotomy scissor as well as 15 blade.  Deep wound swab cultures were obtained. The extensor tendon to the 4th toe was noted to be viable without any signs of acute infection.  The wound was then reapproximated dorsally utilizing layered fashion technique of 2-0 Vicryl and 3-0 prolene and staples. Attention was then redirected to the dorsal wound at the bases of toes 2-4 aspect of the left foot where the above-mentioned  ulceration was debrided removing all devitalized and nonviable tissue through the level of the deeper fatty tissue layers to the level of capsule revealing healthy bleed  ing margins after the wound was debrided the wound measured 2.5x 3.5 cm.  Then utilizing Acell graft was applied to the dorsal aspect of the left foot and stapled in place after the graft was secured then the area was dressed with  Adaptic and Surgilube and Steri-Strips as well as sterile compressive dressing consisting of 4 x 4's and abd and kerlix and ace  wrap.  The pneumatic ankle tourniquet was released.  A prompt hyperemic response was noted to all digits of the left foot following a period of postoperative monitoring.  The patient tolerated the procedure well.  The patient was transferred from the OR to the recovery room.  Vital signs were stable, and  neurovascular status was intact to all digits of the left foot.   No complications.  The patient to return to medical floor.  Recommend continue with IV antibiotics, await cultures, orders in place for nursing to help with dressing changes. Recommend consult to vascular for elevated ABIs to see if they recommend any additional treatment or work up while patient is admitted.  Podiatry to follow.   Landis Martins, DPM

## 2020-10-11 NOTE — Progress Notes (Addendum)
Pt back from pacu in stable condition. No needs at time of arrival from pacu. Assessment performed and vital signs obtained. Rn will continue to monitor.

## 2020-10-12 ENCOUNTER — Encounter (HOSPITAL_COMMUNITY): Payer: Self-pay | Admitting: Sports Medicine

## 2020-10-12 DIAGNOSIS — E876 Hypokalemia: Secondary | ICD-10-CM

## 2020-10-12 LAB — RENAL FUNCTION PANEL
Albumin: 2.5 g/dL — ABNORMAL LOW (ref 3.5–5.0)
Anion gap: 6 (ref 5–15)
BUN: 8 mg/dL (ref 6–20)
CO2: 25 mmol/L (ref 22–32)
Calcium: 9.1 mg/dL (ref 8.9–10.3)
Chloride: 106 mmol/L (ref 98–111)
Creatinine, Ser: 0.61 mg/dL (ref 0.61–1.24)
GFR, Estimated: >60 mL/min (ref >60)
Glucose, Bld: 93 mg/dL (ref 70–99)
Phosphorus: 1.9 mg/dL — ABNORMAL LOW (ref 2.5–4.6)
Potassium: 3.6 mmol/L (ref 3.5–5.1)
Sodium: 137 mmol/L (ref 135–145)

## 2020-10-12 LAB — CBC
HCT: 29.9 % — ABNORMAL LOW (ref 39.0–52.0)
Hemoglobin: 9.2 g/dL — ABNORMAL LOW (ref 13.0–17.0)
MCH: 24.2 pg — ABNORMAL LOW (ref 26.0–34.0)
MCHC: 30.8 g/dL (ref 30.0–36.0)
MCV: 78.7 fL — ABNORMAL LOW (ref 80.0–100.0)
Platelets: 277 10*3/uL (ref 150–400)
RBC: 3.8 MIL/uL — ABNORMAL LOW (ref 4.22–5.81)
RDW: 15.9 % — ABNORMAL HIGH (ref 11.5–15.5)
WBC: 10.1 10*3/uL (ref 4.0–10.5)
nRBC: 0 % (ref 0.0–0.2)

## 2020-10-12 LAB — MAGNESIUM: Magnesium: 1.6 mg/dL — ABNORMAL LOW (ref 1.7–2.4)

## 2020-10-12 MED ORDER — POTASSIUM PHOSPHATES 15 MMOLE/5ML IV SOLN
30.0000 mmol | Freq: Once | INTRAVENOUS | Status: AC
  Administered 2020-10-12: 30 mmol via INTRAVENOUS
  Filled 2020-10-12: qty 10

## 2020-10-12 MED ORDER — MAGNESIUM SULFATE 2 GM/50ML IV SOLN
2.0000 g | Freq: Once | INTRAVENOUS | Status: AC
  Administered 2020-10-12: 2 g via INTRAVENOUS
  Filled 2020-10-12: qty 50

## 2020-10-12 NOTE — Progress Notes (Signed)
PROGRESS NOTE  Bayan Kushnir ITG:[id number] DOB: 1960/08/17   PCP: System, Provider Not In  Patient is from: Home.  Lives with family.  Reports using crutches at baseline.  DOA: 10/09/2020 LOS: 3  Chief complaints: Chief Complaint  Patient presents with   Wound Infection     Brief Narrative / Interim history: 60 year old M with PMH of RA, systolic CHF, chronic venous stasis with chronic left foot ulcer and recent diagnosis of onychomycosis s/p debridement by his podiatrist presenting with worsening chronic left foot ulcer, and admitted for infected left foot ulcer and possible osteomyelitis on x-ray and MRI.  He was a started on broad-spectrum antibiotics-Zosyn and vancomycin.  Podiatry consulted the next day.  Patient underwent fourth metatarsal head excision, irrigation and debridement and graft application on 8/30.  Remains on IV vancomycin and Zosyn pending surgical wound cultures.  Subjective: Seen and examined this morning.  No major events overnight of this morning.  No complaints.  Surgical wound fairly controlled.  Denies chest pain, dyspnea, GI or UTI symptoms.  Objective: Vitals:   10/11/20 1642 10/11/20 2111 10/12/20 0103 10/12/20 0505  BP: (!) 110/56 114/70 111/61 (!) 106/52  Pulse: (!) 103 83 90 98  Resp: 18 16 16 16   Temp: 97.8 F (36.6 C) 99 F (37.2 C) 99.1 F (37.3 C) 98 F (36.7 C)  TempSrc: Oral Oral Oral Oral  SpO2: 98% 95% 97% 95%  Weight:      Height:        Intake/Output Summary (Last 24 hours) at 10/12/2020 1314 Last data filed at 10/12/2020 0600 Gross per 24 hour  Intake 1140 ml  Output 1600 ml  Net -460 ml   Filed Weights   10/10/20 0700 10/11/20 0500  Weight: 58.5 kg 58 kg    Examination:  GENERAL: Frail looking elderly male.  Nontoxic. HEENT: MMM.  Vision and hearing grossly intact.  NECK: Supple.  No apparent JVD.  RESP: On RA.  No IWOB.  Fair aeration bilaterally. CVS:  RRR. Heart sounds normal.  ABD/GI/GU: BS+. Abd soft,  NTND.  MSK/EXT: Deformities from RA.  Chronic venous sufficiency.  Dressing over left foot. SKIN: Dressing over surgical left foot DCI. NEURO: Awake and alert. Oriented appropriately.  No apparent focal neuro deficit. PSYCH: Calm. Normal affect.   Procedures:  6/19-fourth metatarsal head excision, irrigation and debridement and graft application by Dr. Cannon Kettle  Microbiology summarized: COVID-19 PCR nonreactive.  Assessment & Plan: Infection of chronic left foot ulcer with concern for osteomyelitis: X-ray and MRI concerning for osteomyelitis.  Patient is immunocompromised.  No history of diabetes.  Good DP pulses bilaterally.  ABI slightly elevated with triphasic waveforms. -S/p fourth metatarsal head excision, irrigation and debridement and graft application by Dr. Cannon Kettle -Continue vancomycin and Zosyn pending surgical cultures -Follow surgical tissue culture -We will consult ID after culture results. -Continue statin and aspirin -We will clarify weightbearing status with podiatry for therapy  Chronic combined CHF: TTE earlier this month with LVEF of 30 to 35%, R WMA, G2-DD.  Appears euvolemic except for his chronic venous insufficiency.  No cardiopulmonary symptoms. -Continue home Lasix, Aldactone, Imdur, metoprolol and lisinopril -Monitor fluid and respiratory status -Monitor renal functions and electrolytes -Sodium and fluid restriction   Essential hypertension: Normotensive for most part. -Continue cardiac meds as above  Hypokalemia/hypomagnesemia/hypophosphatemia -Replenish and recheck  Rheumatoid arthritis: deformities in his hands and feet. -Continue home Arava and prednisone. -May need stress dose steroid if surgery.  Hyponatremia: Resolved.  Hyperlipidemia: Continue Lipitor 40 mg  daily   Venous stasis with ulcers: Continue aspirin    Chronic pain syndrome: On meloxicam and gabapentin.   -Continue home gabapentin   Anemia of chronic disease: H&H stable. Recent  Labs    09/26/20 0133 09/26/20 0216 09/27/20 0138 09/28/20 0519 09/28/20 1051 09/29/20 0447 10/09/20 1859 10/10/20 0351 10/11/20 0409 10/12/20 0400  HGB 9.8* 9.9* 10.0* 9.4* 9.9*  10.2* 10.2* 8.8* 9.1* 8.8* 9.2*  -Monitor  Debility/physical deconditioning: Reports using crutches at baseline. -PT/OT eval    Body mass index is 22.65 kg/m.         DVT prophylaxis:  enoxaparin (LOVENOX) injection 40 mg Start: 10/10/20 1000  Code Status: Full code Family Communication: Patient and/or RN. Available if any question.  Level of care: Med-Surg Status is: Inpatient  Remains inpatient appropriate because:Ongoing diagnostic testing needed not appropriate for outpatient work up, IV treatments appropriate due to intensity of illness or inability to take PO, and Inpatient level of care appropriate due to severity of illness  Dispo: The patient is from: Home              Anticipated d/c is to: Home              Patient currently is not medically stable to d/c.   Difficult to place patient No       Consultants:  Podiatry   Sch Meds:  Scheduled Meds:  aspirin EC  81 mg Oral Daily   atorvastatin  40 mg Oral Daily   docusate sodium  100 mg Oral BID   enoxaparin (LOVENOX) injection  40 mg Subcutaneous Q24H   furosemide  40 mg Oral Daily   gabapentin  300 mg Oral BID   isosorbide mononitrate  60 mg Oral Daily   lisinopril  5 mg Oral Daily   metoprolol succinate  100 mg Oral Daily   montelukast  10 mg Oral QHS   predniSONE  5 mg Oral BID WC   spironolactone  25 mg Oral Daily   Continuous Infusions:  sodium chloride 100 mL/hr at 10/11/20 1638   piperacillin-tazobactam (ZOSYN)  IV 3.375 g (10/12/20 1244)   potassium PHOSPHATE IVPB (in mmol) 30 mmol (10/12/20 0754)   vancomycin 1,750 mg (10/11/20 2212)   PRN Meds:.fluticasone, morphine injection, ondansetron **OR** ondansetron (ZOFRAN) IV  Antimicrobials: Anti-infectives (From admission, onward)    Start     Dose/Rate  Route Frequency Ordered Stop   10/10/20 2200  vancomycin (VANCOREADY) IVPB 1750 mg/350 mL        1,750 mg 175 mL/hr over 120 Minutes Intravenous Every 24 hours 10/09/20 2227     10/10/20 0400  piperacillin-tazobactam (ZOSYN) IVPB 3.375 g        3.375 g 12.5 mL/hr over 240 Minutes Intravenous Every 8 hours 10/09/20 2155     10/09/20 2200  vancomycin (VANCOCIN) IVPB 1000 mg/200 mL premix        1,000 mg 200 mL/hr over 60 Minutes Intravenous  Once 10/09/20 2155 10/10/20 0841   10/09/20 2000  piperacillin-tazobactam (ZOSYN) IVPB 3.375 g        3.375 g 100 mL/hr over 30 Minutes Intravenous  Once 10/09/20 1950 10/09/20 2111        I have personally reviewed the following labs and images: CBC: Recent Labs  Lab 10/09/20 1859 10/10/20 0351 10/11/20 0409 10/12/20 0400  WBC 6.0 4.7 9.2 10.1  NEUTROABS 3.6  --   --   --   HGB 8.8* 9.1* 8.8* 9.2*  HCT 28.9* 29.7*  28.0* 29.9*  MCV 81.0 80.7 78.9* 78.7*  PLT 293 250 272 277   BMP &GFR Recent Labs  Lab 10/09/20 1859 10/10/20 0351 10/11/20 0409 10/12/20 0400  NA 141 141 133* 137  K 3.7 3.6 3.7 3.6  CL 109 110 102 106  CO2 25 27 25 25   GLUCOSE 91 106* 110* 93  BUN 12 10 8 8   CREATININE 0.67 0.57* 0.68  0.55* 0.61  CALCIUM 9.8 9.5 9.2 9.1  MG  --   --  1.7 1.6*  PHOS  --   --  2.4* 1.9*   Estimated Creatinine Clearance: 80 mL/min (by C-G formula based on SCr of 0.61 mg/dL). Liver & Pancreas: Recent Labs  Lab 10/10/20 0351 10/11/20 0409 10/12/20 0400  AST 19  --   --   ALT 11  --   --   ALKPHOS 80  --   --   BILITOT 0.4  --   --   PROT 6.5  --   --   ALBUMIN 2.9* 2.9* 2.5*   No results for input(s): LIPASE, AMYLASE in the last 168 hours. No results for input(s): AMMONIA in the last 168 hours. Diabetic: No results for input(s): HGBA1C in the last 72 hours. No results for input(s): GLUCAP in the last 168 hours. Cardiac Enzymes: No results for input(s): CKTOTAL, CKMB, CKMBINDEX, TROPONINI in the last 168 hours. No  results for input(s): PROBNP in the last 8760 hours. Coagulation Profile: No results for input(s): INR, PROTIME in the last 168 hours. Thyroid Function Tests: No results for input(s): TSH, T4TOTAL, FREET4, T3FREE, THYROIDAB in the last 72 hours. Lipid Profile: No results for input(s): CHOL, HDL, LDLCALC, TRIG, CHOLHDL, LDLDIRECT in the last 72 hours. Anemia Panel: No results for input(s): VITAMINB12, FOLATE, FERRITIN, TIBC, IRON, RETICCTPCT in the last 72 hours. Urine analysis:    Component Value Date/Time   COLORURINE YELLOW 07/09/2013 1801   APPEARANCEUR CLEAR 07/09/2013 1801   LABSPEC 1.023 07/09/2013 1801   PHURINE 5.5 07/09/2013 1801   GLUCOSEU NEGATIVE 07/09/2013 1801   HGBUR NEGATIVE 07/09/2013 1801   BILIRUBINUR SMALL (A) 07/09/2013 1801   KETONESUR 40 (A) 07/09/2013 1801   PROTEINUR NEGATIVE 07/09/2013 1801   UROBILINOGEN 0.2 07/09/2013 1801   NITRITE NEGATIVE 07/09/2013 1801   LEUKOCYTESUR NEGATIVE 07/09/2013 1801   Sepsis Labs: Invalid input(s): PROCALCITONIN, [place]  Microbiology: Recent Results (from the past 240 hour(s))  SARS CORONAVIRUS 2 (TAT 6-24 HRS) Nasopharyngeal Nasopharyngeal Swab     Status: None   Collection Time: 10/09/20  9:57 PM   Specimen: Nasopharyngeal Swab  Result Value Ref Range Status   SARS Coronavirus 2 NEGATIVE NEGATIVE Final    Comment: (NOTE) SARS-CoV-2 target nucleic acids are NOT DETECTED.  The SARS-CoV-2 RNA is generally detectable in upper and lower respiratory specimens during the acute phase of infection. Negative results do not preclude SARS-CoV-2 infection, do not rule out co-infections with other pathogens, and should not be used as the sole basis for treatment or other patient management decisions. Negative results must be combined with clinical observations, patient history, and epidemiological information. The expected result is Negative.  Fact Sheet for Patients: SugarRoll.be  Fact  Sheet for Healthcare Providers: https://www.woods-mathews.com/  This test is not yet approved or cleared by the [place] FDA and  has been authorized for detection and/or diagnosis of SARS-CoV-2 by FDA under an Emergency Use Authorization (EUA). This EUA will remain  in effect (meaning this test can be used) for the duration of the  COVID-19 declaration under Se ction 564(b)(1) of the Act, 21 U.S.C. section 360bbb-3(b)(1), unless the authorization is terminated or revoked sooner.  Performed at [facility], [place] [address] [place], [place] [address]   Surgical pcr screen     Status: None   Collection Time: 10/10/20  3:34 PM   Specimen: Nasal Mucosa; Nasal Swab  Result Value Ref Range Status   MRSA, PCR NEGATIVE NEGATIVE Final   Staphylococcus aureus NEGATIVE NEGATIVE Final    Comment: (NOTE) The Xpert SA Assay (FDA approved for NASAL specimens in patients 98 years of age and older), is one component of a comprehensive surveillance program. It is not intended to diagnose infection nor to guide or monitor treatment. Performed at [facility], [place] [address], [place], [place] [address]     Radiology Studies: No results found.     Nellene Courtois T. Otho  If 7PM-7AM, please contact night-coverage [email or web address] 10/12/2020, 1:14 PM

## 2020-10-13 DIAGNOSIS — I70262 Atherosclerosis of native arteries of extremities with gangrene, left leg: Secondary | ICD-10-CM

## 2020-10-13 DIAGNOSIS — R5381 Other malaise: Secondary | ICD-10-CM

## 2020-10-13 DIAGNOSIS — M86172 Other acute osteomyelitis, left ankle and foot: Secondary | ICD-10-CM | POA: Diagnosis not present

## 2020-10-13 LAB — SURGICAL PATHOLOGY

## 2020-10-13 MED ORDER — METOPROLOL SUCCINATE ER 50 MG PO TB24: 50.0000 mg | ORAL_TABLET | Freq: Every day | ORAL | Status: DC

## 2020-10-13 MED ORDER — METOPROLOL SUCCINATE ER 25 MG PO TB24
50.0000 mg | ORAL_TABLET | Freq: Every day | ORAL | Status: DC
  Administered 2020-10-13 – 2020-10-14 (×2): 50 mg via ORAL
  Filled 2020-10-13: qty 1

## 2020-10-13 NOTE — Evaluation (Signed)
Physical Therapy Evaluation Patient Details Name: Jonathan [name] MRN: [id number] DOB: [birth year] Today's Date: 10/13/2020   History of Present Illness  Patient s/p fourth metatarsal head excision, irrigation and debridement and graft application on 1/02.  60 year old M with PMH of RA, systolic CHF, chronic venous stasis with chronic left foot ulcer and recent diagnosis of onychomycosis s/p debridement by his podiatrist presenting with worsening chronic left foot ulcer, and admitted for infected left foot ulcer and possible osteomyelitis on x-ray and MRI.  Clinical Impression  Pt admitted with above diagnosis.  Pt amb ~ 7' with crutches, asked ortho tech to bring him a new set of crutches since his current Acs are worn/axilla padding gone and pt is heavily reliant on axilla support for stability.  Pt currently with functional limitations due to the deficits listed below (see PT Problem List). Pt will benefit from skilled PT to increase their independence and safety with mobility to allow discharge to the venue listed below.       Follow Up Recommendations Home health PT;Supervision for mobility/OOB    Equipment Recommendations  None recommended by PT    Recommendations for Other Services       Precautions / Restrictions Precautions Precautions: Fall Restrictions Weight Bearing Restrictions: No      Mobility  Bed Mobility Overal bed mobility: Needs Assistance Bed Mobility: Supine to Sit     Supine to sit: Mod assist     General bed mobility comments: in chair on arrival    Transfers Overall transfer level: Needs assistance Equipment used: Crutches Transfers: Sit to/from Stand Sit to Stand: Min assist Stand pivot transfers: Min guard       General transfer comment: assist to rise and transition to crutches, incr time to stabilize/achieve midline  once standing  Ambulation/Gait Ambulation/Gait assistance: Min assist Gait Distance (Feet): 40 Feet Assistive  device: Crutches Gait Pattern/deviations: Step-through pattern;Decreased stride length;Decreased weight shift to right     General Gait Details: unsteady initially requiring min assist of 2, stability improved with  distance  Stairs            Wheelchair Mobility    Modified Rankin (Stroke Patients Only)       Balance Overall balance assessment: Needs assistance Sitting-balance support: No upper extremity supported;Feet supported Sitting balance-Leahy Scale: Fair     Standing balance support: Bilateral upper extremity supported Standing balance-Leahy Scale: Poor Standing balance comment: reliant on UEs and external assist                             Pertinent Vitals/Pain Pain Assessment: No/denies pain    Home Living Family/patient expects to be discharged to:: Private residence Living Arrangements: Other relatives (cousin) Available Help at Discharge: Family;Available 24 hours/day Type of Home: House   Entrance Stairs-Rails: Right;Left Entrance Stairs-Number of Steps: 5 Home Layout: One level Home Equipment: Toilet riser;Shower seat;Bedside commode;Hospital bed;Wheelchair - power Additional Comments: has a lift chair that heps him stand. has a hospital bed that he elevates as well for standing.    Prior Function        ADL's / Homemaking Assistance Needed: aide comes daily for assistance with bathing and dressing, getting out of bed,  longer utensiles for feeding,  Comments: ambulates with crutches     Hand Dominance        Extremity/Trunk Assessment   Upper Extremity Assessment Upper Extremity Assessment: Defer to OT evaluation    Lower Extremity Assessment Lower  Extremity Assessment: LLE deficits/detail LLE Deficits / Details: moves ankle through limited ROM, bandaging in place; hip and knee at least 3+/5    Cervical / Trunk Assessment Cervical / Trunk Assessment: Normal  Communication   Communication: No difficulties  Cognition  Arousal/Alertness: Awake/alert Behavior During Therapy: WFL for tasks assessed/performed Overall Cognitive Status: No family/caregiver present to determine baseline cognitive functioning                                        General Comments      Exercises     Assessment/Plan    PT Assessment Patient needs continued PT services  PT Problem List Decreased strength;Decreased mobility;Decreased safety awareness;Decreased balance;Decreased activity tolerance       PT Treatment Interventions DME instruction;Therapeutic exercise;Gait training;Functional mobility training;Therapeutic activities;Stair training;Patient/family education;Balance training    PT Goals (Current goals can be found in the Care Plan section)  Acute Rehab PT Goals Patient Stated Goal: to go home PT Goal Formulation: With patient Time For Goal Achievement: 10/26/20 Potential to Achieve Goals: Good    Frequency Min 5X/week   Barriers to discharge        Co-evaluation               AM-PAC PT "6 Clicks" Mobility  Outcome Measure Help needed turning from your back to your side while in a flat bed without using bedrails?: A Little Help needed moving from lying on your back to sitting on the side of a flat bed without using bedrails?: A Little Help needed moving to and from a bed to a chair (including a wheelchair)?: A Little Help needed standing up from a chair using your arms (e.g., wheelchair or bedside chair)?: A Lot Help needed to walk in hospital room?: A Little Help needed climbing 3-5 steps with a railing? : A Little 6 Click Score: 17    End of Session Equipment Utilized During Treatment: Gait belt Activity Tolerance: Patient tolerated treatment well Patient left: in chair;with call bell/phone within reach;with chair alarm set   PT Visit Diagnosis: Unsteadiness on feet (R26.81);Other abnormalities of gait and mobility (R26.89)    Time: 5027-7412 PT Time Calculation (min)  (ACUTE ONLY): 13 min   Charges:   PT Evaluation $PT Eval Low Complexity: [place], PT  Acute Rehab Dept ([place]) [phone] Pager [phone]  10/13/2020   [facility] 10/13/2020, 2:20 PM

## 2020-10-13 NOTE — Evaluation (Signed)
Occupational Therapy Evaluation Patient Details Name: Leeman Johnsey MRN: [id number] DOB: [birth year] Today's Date: 10/13/2020    History of Present Illness Patient s/p fourth metatarsal head excision, irrigation and debridement and graft application on 5/94.  60 year old M with PMH of RA, systolic CHF, chronic venous stasis with chronic left foot ulcer and recent diagnosis of onychomycosis s/p debridement by his podiatrist presenting with worsening chronic left foot ulcer, and admitted for infected left foot ulcer and possible osteomyelitis on x-ray and MRI.   Clinical Impression   Mr. Cleve Paolillo is a 60 year old man who presents s/p left foot surgery with decreased balance, decreased activity tolerance and foot pain resulting in a decline in his baseline abilities to ambulate and perform toileting. At baseline he has an aide to assist with getting out of bed, bathing and dressing but typically is able to ambulate in his home with crutches and perform toileting. Today patient needed some assistance for standing and ambulation limited to transfer to recliner. Patient will benefit from skilled OT services while in hospital to improve deficits and learn compensatory strategies as needed in order to return to PLOF.      Follow Up Recommendations  No OT follow up    Equipment Recommendations  None recommended by OT    Recommendations for Other Services       Precautions / Restrictions Precautions Precautions: Fall Restrictions Weight Bearing Restrictions: No      Mobility Bed Mobility Overal bed mobility: Needs Assistance Bed Mobility: Supine to Sit     Supine to sit: Mod assist     General bed mobility comments: assistance for trunk negotiation.    Transfers Overall transfer level: Needs assistance Equipment used: Crutches Transfers: Sit to/from [facility] Sit to Stand: Min assist Stand pivot transfers: Min guard            Balance Overall  balance assessment: Needs assistance Sitting-balance support: No upper extremity supported;Feet supported Sitting balance-Leahy Scale: Fair     Standing balance support: Bilateral upper extremity supported Standing balance-Leahy Scale: Poor                             ADL either performed or assessed with clinical judgement   ADL Overall ADL's : Needs assistance/impaired Eating/Feeding: Moderate assistance;Maximal assistance;Set up Eating/Feeding Details (indicate cue type and reason): REquires set up and assistance to feed. At home has specialized utensils. Grooming: Maximal assistance;Bed level;Sitting   Upper Body Bathing: Maximal assistance;Sitting;Bed level   Lower Body Bathing: Total assistance;Sitting/lateral leans   Upper Body Dressing : Maximal assistance;Sitting   Lower Body Dressing: Total assistance;Sit to/from stand   Toilet Transfer: BSC;Min Psychiatric nurse Details (indicate cue type and reason): able to take steps to the recliner with crutches with min guard. Toileting- Clothing Manipulation and Hygiene: Total assistance;Sit to/from stand               Vision Patient Visual Report: No change from baseline       Perception     Praxis      Pertinent Vitals/Pain Pain Assessment: 0-10 Pain Score: 4  Pain Location: bil feet Pain Descriptors / Indicators: Aching Pain Intervention(s): Monitored during session     Hand Dominance Right   Extremity/Trunk Assessment Upper Extremity Assessment Upper Extremity Assessment:  (Arthritic changes bilaterally - more specifically in hands. Short arm length.)   Lower Extremity Assessment Lower Extremity Assessment: Defer to PT evaluation   Cervical /  Trunk Assessment Cervical / Trunk Assessment: Normal   Communication Communication Communication: No difficulties   Cognition Arousal/Alertness: Awake/alert Behavior During Therapy: WFL for tasks assessed/performed Overall Cognitive Status:  Within Functional Limits for tasks assessed                                     General Comments       Exercises     Shoulder Instructions      Home Living Family/patient expects to be discharged to:: Private residence Living Arrangements: Other relatives (Cousin\) Available Help at Discharge: Family;Available 24 hours/day Type of Home: House Home Access: Stairs to enter [employer] of Steps: 5 Entrance Stairs-Rails: Right;Left Home Layout: One level     Bathroom Shower/Tub: Teacher, early years/pre: Standard Bathroom Accessibility: Yes   Home Equipment: Toilet riser;Shower seat;Bedside commode;Hospital bed;Wheelchair - power   Additional Comments: has a lift chair that heps him stand. has a hospital bed that he elevates as well for standing.      Prior Functioning/Environment Level of Independence: Needs assistance    ADL's / Homemaking Assistance Needed: aide comes daily for assistance with bathing and dressing, getting out of bed,  longer utensiles for feeding,   Comments: ambulates with crutches        OT Problem List: Impaired balance (sitting and/or standing);Pain;Impaired UE functional use;Decreased activity tolerance;Decreased knowledge of use of DME or AE      OT Treatment/Interventions: Self-care/ADL training;DME and/or AE instruction;Therapeutic activities;Balance training;Patient/family education    OT Goals(Current goals can be found in the care plan section) Acute Rehab OT Goals Patient Stated Goal: to go home OT Goal Formulation: With patient Time For Goal Achievement: 10/27/20 Potential to Achieve Goals: Good  OT Frequency: Min 2X/week   Barriers to D/C:            Co-evaluation              AM-PAC OT "6 Clicks" Daily Activity     Outcome Measure Help from another person eating meals?: A Lot Help from another person taking care of personal grooming?: A Lot Help from another person toileting, which  includes using toliet, bedpan, or urinal?: Total Help from another person bathing (including washing, rinsing, drying)?: A Lot Help from another person to put on and taking off regular upper body clothing?: A Lot Help from another person to put on and taking off regular lower body clothing?: Total 6 Click Score: 10   End of Session Equipment Utilized During Treatment: Gait belt Nurse Communication: Mobility status  Activity Tolerance: Patient tolerated treatment well Patient left: in chair;with call bell/phone within reach;with chair alarm set  OT Visit Diagnosis: Other abnormalities of gait and mobility (R26.89);Pain Pain - Right/Left: Left Pain - part of body: Ankle and joints of foot                Time: 5465-6812 OT Time Calculation (min): 22 min Charges:  OT General Charges $OT Visit: 1 Visit OT Evaluation $OT Eval Moderate Complexity: 1 Mod  Jamarien Rodkey, OTR/L [place]  Office [phone] Pager: [place] 10/13/2020, 12:42 PM

## 2020-10-13 NOTE — Consult Note (Signed)
[facility]  Patient name: Jonathan [name] MRN: [id number] DOB: January 18, 1961 Sex: male    HPI: Jonathan [name] is a 59 y.o. male seen today for evaluation of lower extremity arterial flow.  He is currently sitting in his room at the bedside chair.  He is not able to answer questions appropriately.  Question his baseline mental status.  He states that he is with able to walk but I am not sure this is correct.  He is known to our practice from prior treatment of venous hypertension with Dr.Dickson.  He was admitted with osteomyelitis and nonhealing left foot ulcer.  Underwent fourth metatarsal head resection by Dr. Cannon Kettle.  Postop dressing intact and I did not remove this.  Surgery was 10/11/2020.  I did review photographs preoperatively.   Past Medical History:  Diagnosis Date   Anemia    H/o using iron in the past    Arthritis    rheumatoid   Chronic back pain    Hyperlipidemia    Joint pain    Joint swelling    Rheumatoid arthritis(714.0)     Family History  Problem Relation Age of Onset   Cancer Mother        ?   Hypertension Mother    Heart failure Father        heart transplant   Cancer Brother    Anesthesia problems Neg Hx    Colon cancer Neg Hx        mother had "3" types of CA- unsure about which one   Esophageal cancer Neg Hx    Rectal cancer Neg Hx    Stomach cancer Neg Hx     SOCIAL HISTORY: Social History   Tobacco Use   Smoking status: Former    Pack years: 0.00    Types: Cigarettes    Quit date: 04/25/2004    Years since quitting: 16.4   Smokeless tobacco: Never  Substance Use Topics   Alcohol use: No    Allergies  Allergen Reactions   Infliximab Nausea Only and Shortness Of Breath    Current Facility-Administered Medications  Medication Dose Route Frequency Provider Last Rate Last Admin   aspirin EC tablet 81 mg  81 mg Oral Daily Gonfa, Taye T, MD   81 mg at 10/13/20 0947    atorvastatin (LIPITOR) tablet 40 mg  40 mg Oral Daily Gala Romney L, MD   40 mg at 10/13/20 0947   docusate sodium (COLACE) capsule 100 mg  100 mg Oral BID Wendee Beavers T, MD   100 mg at 10/13/20 0947   enoxaparin (LOVENOX) injection 40 mg  40 mg Subcutaneous Q24H Garba, Mohammad L, MD   40 mg at 10/13/20 1001   fluticasone (FLONASE) 50 MCG/ACT nasal spray 1 spray  1 spray Each Nare Daily PRN Wendee Beavers T, MD       gabapentin (NEURONTIN) capsule 300 mg  300 mg Oral BID Wendee Beavers T, MD   300 mg at 10/13/20 0946   isosorbide mononitrate (IMDUR) 24 hr tablet 60 mg  60 mg Oral Daily Gala Romney L, MD   60 mg at 10/13/20 0947   metoprolol succinate (TOPROL-XL) 24 hr tablet 50 mg  50 mg Oral Daily Wouk, Ailene Rud, MD   50 mg at 10/13/20 1450   montelukast (SINGULAIR) tablet 10 mg  10 mg Oral QHS Wendee Beavers T, MD   10 mg at 10/12/20 2108   morphine 2 MG/ML injection 2  mg  2 mg Intravenous Q2H PRN Gala Romney L, MD   2 mg at 10/12/20 2108   ondansetron (ZOFRAN) tablet 4 mg  4 mg Oral Q6H PRN Elwyn Reach, MD       Or   ondansetron (ZOFRAN) injection 4 mg  4 mg Intravenous Q6H PRN Elwyn Reach, MD       piperacillin-tazobactam (ZOSYN) IVPB 3.375 g  3.375 g Intravenous Q8H Angela Adam, RPH 12.5 mL/hr at 10/13/20 1212 3.375 g at 10/13/20 1212   predniSONE (DELTASONE) tablet 5 mg  5 mg Oral BID WC Wendee Beavers T, MD   5 mg at 10/13/20 0848   vancomycin (VANCOREADY) IVPB 1750 mg/350 mL  1,750 mg Intravenous Q24H Angela Adam, RPH 175 mL/hr at 10/12/20 2110 1,750 mg at 10/12/20 2110    REVIEW OF SYSTEMS:  Reviewed in chart with nothing to add   PHYSICAL EXAM: Vitals:   10/12/20 2044 10/13/20 0449 10/13/20 0944 10/13/20 1340  BP: (!) 100/56 104/71 104/65 (!) 106/58  Pulse: 92 93 99 96  Resp: 16 16 16 16   Temp: 99 F (37.2 C) 98.1 F (36.7 C) 98.8 F (37.1 C) 98.5 F (36.9 C)  TempSrc: Oral Oral Oral Oral  SpO2: 96% 98% 99% 98%  Weight:      Height:         GENERAL: The patient is a well-nourished male, in no acute distress. The vital signs are documented above. CARDIOVASCULAR: 3+ popliteal and 3+ dorsalis pedis pulse on the right and 3+ anterior tibial pulse at the ankle on the left PULMONARY: There is good air exchange  MUSCULOSKELETAL: There are no major deformities or cyanosis. NEUROLOGIC: No focal weakness or paresthesias are detected. SKIN: There are no ulcers or rashes noted. PSYCHIATRIC: The patient has a normal affect.  DATA:  Normal triphasic waveforms in both lower extremities with some elevated pressures suggesting calcification of the vessels  MEDICAL ISSUES: No evidence of lower extremity arterial disease.  Gangrenous changes in left foot.  Question his ambulatory status.  Appears to be at high risk for healing due to general debilitated state.  No evidence of of arterial disease and no need for further work-up.  Will not follow actively    Rosetta Posner, MD [facility] [phone]  Note: Portions of this report may have been transcribed using voice recognition software.  Every effort has been made to ensure accuracy; however, inadvertent computerized transcription errors may still be present.

## 2020-10-13 NOTE — Progress Notes (Addendum)
PROGRESS NOTE  Jonathan [name] OXB:[id number] DOB: 1960-12-27   PCP: System, Provider Not In  Patient is from: Home.  Lives with family.  Reports using crutches at baseline.  DOA: 10/09/2020 LOS: 4  Chief complaints: Chief Complaint  Patient presents with   Wound Infection     Brief Narrative / Interim history: 60 year old M with PMH of RA, systolic CHF, chronic venous stasis with chronic left foot ulcer and recent diagnosis of onychomycosis s/p debridement by his podiatrist presenting with worsening chronic left foot ulcer, and admitted for infected left foot ulcer and possible osteomyelitis on x-ray and MRI.  He was a started on broad-spectrum antibiotics-Zosyn and vancomycin.  Podiatry consulted the next day.  Patient underwent fourth metatarsal head excision, irrigation and debridement and graft application on 6/83.  Remains on IV vancomycin and Zosyn pending surgical wound cultures.  Subjective: Seen and examined this morning.  No major events overnight of this morning.  No complaints.  Surgical wound dressed.  Denies chest pain, dyspnea. Tolerating diet.  Objective: Vitals:   10/12/20 1451 10/12/20 2044 10/13/20 0449 10/13/20 0944  BP: (!) 82/52 (!) 100/56 104/71 104/65  Pulse: 90 92 93 99  Resp: 17 16 16 16   Temp: 98.4 F (36.9 C) 99 F (37.2 C) 98.1 F (36.7 C) 98.8 F (37.1 C)  TempSrc: Oral Oral Oral Oral  SpO2: 96% 96% 98% 99%  Weight:      Height:        Intake/Output Summary (Last 24 hours) at 10/13/2020 1224 Last data filed at 10/13/2020 1106 Gross per 24 hour  Intake 4309.24 ml  Output 1550 ml  Net 2759.24 ml   Filed Weights   10/10/20 0700 10/11/20 0500  Weight: 58.5 kg 58 kg    Examination:  GENERAL: Frail looking elderly male.  Nontoxic. HEENT: MMM.  Vision and hearing grossly intact.  NECK: Supple.  No apparent JVD.  RESP: On RA.  No IWOB.  Fair aeration bilaterally. CVS:  RRR. Heart sounds normal.  ABD/GI/GU: BS+. Abd soft, NTND.   MSK/EXT: Deformities from RA.  Chronic venous sufficiency.  Dressing over left foot. SKIN: Dressing over surgical left foot DCI. NEURO: Awake and alert. Oriented appropriately.  No apparent focal neuro deficit. PSYCH: Calm. Normal affect.   Procedures:  6/19-fourth metatarsal head excision, irrigation and debridement and graft application by Dr. Cannon Kettle  Microbiology summarized: COVID-19 PCR nonreactive.  Assessment & Plan: Infection of chronic left foot ulcer with concern for osteomyelitis: X-ray and MRI concerning for osteomyelitis.  Patient is immunocompromised.  No history of diabetes.  Good DP pulses bilaterally.  ABI slightly elevated with triphasic waveforms. -S/p fourth metatarsal head excision, irrigation and debridement and graft application by Dr. Cannon Kettle - Continue vancomycin and Zosyn pending surgical cultures - surg path margins resulted negative today, podiatry advising total of 10 days abx -pt/ot consulted  Abnormal ABI - I have consulted vascular surgery today - cont statin and aspirin  Chronic combined CHF: TTE earlier this month with LVEF of 30 to 35%, R WMA, G2-DD.  Appears euvolemic except for his chronic venous insufficiency.  No cardiopulmonary symptoms. Borderline hypotension today - decrease metop to 50. Hold lasix 40 oral qd and spiro 25 oral qd today. Hold lisinopril. Cont imdur -Continue home Lasix, Aldactone, Imdur, metoprolol and lisinopril -Monitor fluid and respiratory status -Monitor renal functions and electrolytes -Sodium and fluid restriction   Essential hypertension:  Borderline hypotension today MAPS in 70s - bp meds as above  Hypokalemia/hypomagnesemia/hypophosphatemia - am labs  ordered for tomorrow (none ordered today)  Rheumatoid arthritis: deformities in his hands and feet. -Continue home Arava and prednisone.  Hyponatremia: Resolved.  Hyperlipidemia: Continue Lipitor 40 mg daily   Venous stasis with ulcers: Continue aspirin     Chronic pain syndrome: On meloxicam and gabapentin.   -Continue home gabapentin   Anemia of chronic disease: H&H stable. Recent Labs    09/26/20 0133 09/26/20 0216 09/27/20 0138 09/28/20 0519 09/28/20 1051 09/29/20 0447 10/09/20 1859 10/10/20 0351 10/11/20 0409 10/12/20 0400  HGB 9.8* 9.9* 10.0* 9.4* 9.9*  10.2* 10.2* 8.8* 9.1* 8.8* 9.2*  -Monitor  Debility/physical deconditioning: Reports using crutches at baseline. -PT/OT eval    Body mass index is 22.65 kg/m.         DVT prophylaxis:  enoxaparin (LOVENOX) injection 40 mg Start: 10/10/20 1000  Code Status: Full code Family Communication: cousin vernel updated telephonically 6/21 Level of care: Med-Surg Status is: Inpatient  Remains inpatient appropriate because:Ongoing diagnostic testing needed not appropriate for outpatient work up, IV treatments appropriate due to intensity of illness or inability to take PO, and Inpatient level of care appropriate due to severity of illness  Dispo: The patient is from: Home              Anticipated d/c is to: Home              Patient currently is not medically stable to d/c.   Difficult to place patient No       Consultants:  Podiatry   Sch Meds:  Scheduled Meds:  aspirin EC  81 mg Oral Daily   atorvastatin  40 mg Oral Daily   docusate sodium  100 mg Oral BID   enoxaparin (LOVENOX) injection  40 mg Subcutaneous Q24H   furosemide  40 mg Oral Daily   gabapentin  300 mg Oral BID   isosorbide mononitrate  60 mg Oral Daily   lisinopril  5 mg Oral Daily   metoprolol succinate  100 mg Oral Daily   montelukast  10 mg Oral QHS   predniSONE  5 mg Oral BID WC   spironolactone  25 mg Oral Daily   Continuous Infusions:  sodium chloride 100 mL/hr at 10/13/20 1106   piperacillin-tazobactam (ZOSYN)  IV 3.375 g (10/13/20 1212)   vancomycin 1,750 mg (10/12/20 2110)   PRN Meds:.fluticasone, morphine injection, ondansetron **OR** ondansetron (ZOFRAN)  IV  Antimicrobials: Anti-infectives (From admission, onward)    Start     Dose/Rate Route Frequency Ordered Stop   10/10/20 2200  vancomycin (VANCOREADY) IVPB 1750 mg/350 mL        1,750 mg 175 mL/hr over 120 Minutes Intravenous Every 24 hours 10/09/20 2227     10/10/20 0400  piperacillin-tazobactam (ZOSYN) IVPB 3.375 g        3.375 g 12.5 mL/hr over 240 Minutes Intravenous Every 8 hours 10/09/20 2155     10/09/20 2200  vancomycin (VANCOCIN) IVPB 1000 mg/200 mL premix        1,000 mg 200 mL/hr over 60 Minutes Intravenous  Once 10/09/20 2155 10/10/20 0841   10/09/20 2000  piperacillin-tazobactam (ZOSYN) IVPB 3.375 g        3.375 g 100 mL/hr over 30 Minutes Intravenous  Once 10/09/20 1950 10/09/20 2111        I have personally reviewed the following labs and images: CBC: Recent Labs  Lab 10/09/20 1859 10/10/20 0351 10/11/20 0409 10/12/20 0400  WBC 6.0 4.7 9.2 10.1  NEUTROABS 3.6  --   --   --  HGB 8.8* 9.1* 8.8* 9.2*  HCT 28.9* 29.7* 28.0* 29.9*  MCV 81.0 80.7 78.9* 78.7*  PLT 293 250 272 277   BMP &GFR Recent Labs  Lab 10/09/20 1859 10/10/20 0351 10/11/20 0409 10/12/20 0400  NA 141 141 133* 137  K 3.7 3.6 3.7 3.6  CL 109 110 102 106  CO2 25 27 25 25   GLUCOSE 91 106* 110* 93  BUN 12 10 8 8   CREATININE 0.67 0.57* 0.68  0.55* 0.61  CALCIUM 9.8 9.5 9.2 9.1  MG  --   --  1.7 1.6*  PHOS  --   --  2.4* 1.9*   Estimated Creatinine Clearance: 80 mL/min (by C-G formula based on SCr of 0.61 mg/dL). Liver & Pancreas: Recent Labs  Lab 10/10/20 0351 10/11/20 0409 10/12/20 0400  AST 19  --   --   ALT 11  --   --   ALKPHOS 80  --   --   BILITOT 0.4  --   --   PROT 6.5  --   --   ALBUMIN 2.9* 2.9* 2.5*   No results for input(s): LIPASE, AMYLASE in the last 168 hours. No results for input(s): AMMONIA in the last 168 hours. Diabetic: No results for input(s): HGBA1C in the last 72 hours. No results for input(s): GLUCAP in the last 168 hours. Cardiac  Enzymes: No results for input(s): CKTOTAL, CKMB, CKMBINDEX, TROPONINI in the last 168 hours. No results for input(s): PROBNP in the last 8760 hours. Coagulation Profile: No results for input(s): INR, PROTIME in the last 168 hours. Thyroid Function Tests: No results for input(s): TSH, T4TOTAL, FREET4, T3FREE, THYROIDAB in the last 72 hours. Lipid Profile: No results for input(s): CHOL, HDL, LDLCALC, TRIG, CHOLHDL, LDLDIRECT in the last 72 hours. Anemia Panel: No results for input(s): VITAMINB12, FOLATE, FERRITIN, TIBC, IRON, RETICCTPCT in the last 72 hours. Urine analysis:    Component Value Date/Time   COLORURINE YELLOW 07/09/2013 1801   APPEARANCEUR CLEAR 07/09/2013 1801   LABSPEC 1.023 07/09/2013 1801   PHURINE 5.5 07/09/2013 1801   GLUCOSEU NEGATIVE 07/09/2013 1801   HGBUR NEGATIVE 07/09/2013 1801   BILIRUBINUR SMALL (A) 07/09/2013 1801   KETONESUR 40 (A) 07/09/2013 1801   PROTEINUR NEGATIVE 07/09/2013 1801   UROBILINOGEN 0.2 07/09/2013 1801   NITRITE NEGATIVE 07/09/2013 1801   LEUKOCYTESUR NEGATIVE 07/09/2013 1801   Sepsis Labs: Invalid input(s): PROCALCITONIN, [place]  Microbiology: Recent Results (from the past 240 hour(s))  SARS CORONAVIRUS 2 (TAT 6-24 HRS) Nasopharyngeal Nasopharyngeal Swab     Status: None   Collection Time: 10/09/20  9:57 PM   Specimen: Nasopharyngeal Swab  Result Value Ref Range Status   SARS Coronavirus 2 NEGATIVE NEGATIVE Final    Comment: (NOTE) SARS-CoV-2 target nucleic acids are NOT DETECTED.  The SARS-CoV-2 RNA is generally detectable in upper and lower respiratory specimens during the acute phase of infection. Negative results do not preclude SARS-CoV-2 infection, do not rule out co-infections with other pathogens, and should not be used as the sole basis for treatment or other patient management decisions. Negative results must be combined with clinical observations, patient history, and epidemiological information. The  expected result is Negative.  Fact Sheet for Patients: SugarRoll.be  Fact Sheet for Healthcare Providers: https://www.woods-mathews.com/  This test is not yet approved or cleared by the [place] FDA and  has been authorized for detection and/or diagnosis of SARS-CoV-2 by FDA under an Emergency Use Authorization (EUA). This EUA will remain  in effect (meaning this  test can be used) for the duration of the COVID-19 declaration under Se ction 564(b)(1) of the Act, 21 U.S.C. section 360bbb-3(b)(1), unless the authorization is terminated or revoked sooner.  Performed at [facility], [place] [address] [place], [place] [address]   Surgical pcr screen     Status: None   Collection Time: 10/10/20  3:34 PM   Specimen: Nasal Mucosa; Nasal Swab  Result Value Ref Range Status   MRSA, PCR NEGATIVE NEGATIVE Final   Staphylococcus aureus NEGATIVE NEGATIVE Final    Comment: (NOTE) The Xpert SA Assay (FDA approved for NASAL specimens in patients 20 years of age and older), is one component of a comprehensive surveillance program. It is not intended to diagnose infection nor to guide or monitor treatment. Performed at [facility], [place] [address] [place], [place] [address]   Aerobic/Anaerobic Culture w Gram Stain (surgical/deep wound)     Status: None (Preliminary result)   Collection Time: 10/11/20 11:06 AM   Specimen: Wound  Result Value Ref Range Status   Specimen Description   Final    WOUND LEFT FOOT Performed at [place] [address], [place], [place] [address]    Special Requests   Final    PATIENT ON FOLLOWING Cephas Darby Performed at [facility], [place] [address] [place], [place] [address]    Gram Stain NO WBC SEEN RARE GRAM POSITIVE COCCI   Final   Culture   Final    CULTURE REINCUBATED FOR BETTER GROWTH Performed at [facility], [place] [address]  [place], [place] [address]    Report Status PENDING  Incomplete    Radiology Studies: No results found.     Laurey Arrow, MD [facility]  If 7PM-7AM, please contact night-coverage [email or web address] 10/13/2020, 12:24 PM

## 2020-10-13 NOTE — Progress Notes (Signed)
Orthopedic Tech Progress Note Patient Details:  Jonathan [name] 1961-03-16 [id number]  Ortho Devices Type of Ortho Device: Postop shoe/boot Ortho Device/Splint Location: left Ortho Device/Splint Interventions: Application   Post Interventions Patient Tolerated: Well Instructions Provided: Care of device  [name] 10/13/2020, 1:27 PM

## 2020-10-13 NOTE — Plan of Care (Signed)

## 2020-10-14 ENCOUNTER — Inpatient Hospital Stay (HOSPITAL_COMMUNITY): Payer: Medicare Other

## 2020-10-14 ENCOUNTER — Inpatient Hospital Stay (HOSPITAL_COMMUNITY): Payer: Medicare Other | Admitting: Certified Registered Nurse Anesthetist

## 2020-10-14 ENCOUNTER — Encounter (HOSPITAL_COMMUNITY): Payer: Self-pay | Admitting: Neurology

## 2020-10-14 ENCOUNTER — Telehealth (HOSPITAL_COMMUNITY): Payer: Self-pay | Admitting: Surgery

## 2020-10-14 ENCOUNTER — Encounter (HOSPITAL_COMMUNITY)
Admission: EM | Disposition: A | Discharge status: Rehabilitation Facility | Payer: Self-pay | Source: Home / Self Care | Attending: Neurology

## 2020-10-14 DIAGNOSIS — I639 Cerebral infarction, unspecified: Secondary | ICD-10-CM | POA: Diagnosis present

## 2020-10-14 DIAGNOSIS — I5022 Chronic systolic (congestive) heart failure: Secondary | ICD-10-CM

## 2020-10-14 DIAGNOSIS — R9431 Abnormal electrocardiogram [ECG] [EKG]: Secondary | ICD-10-CM

## 2020-10-14 DIAGNOSIS — L97522 Non-pressure chronic ulcer of other part of left foot with fat layer exposed: Secondary | ICD-10-CM

## 2020-10-14 DIAGNOSIS — I25118 Atherosclerotic heart disease of native coronary artery with other forms of angina pectoris: Secondary | ICD-10-CM

## 2020-10-14 DIAGNOSIS — I63512 Cerebral infarction due to unspecified occlusion or stenosis of left middle cerebral artery: Secondary | ICD-10-CM

## 2020-10-14 HISTORY — PX: IR US GUIDE VASC ACCESS RIGHT: IMG2390

## 2020-10-14 HISTORY — PX: IR PERCUTANEOUS ART THROMBECTOMY/INFUSION INTRACRANIAL INC DIAG ANGIO: IMG6087

## 2020-10-14 HISTORY — PX: IR CT HEAD LTD: IMG2386

## 2020-10-14 HISTORY — PX: RADIOLOGY WITH ANESTHESIA: SHX6223

## 2020-10-14 LAB — POCT I-STAT 7, (LYTES, BLD GAS, ICA,H+H)
Acid-Base Excess: 4 mmol/L — ABNORMAL HIGH (ref 0.0–2.0)
Bicarbonate: 28.1 mmol/L — ABNORMAL HIGH (ref 20.0–28.0)
Calcium, Ion: 1.26 mmol/L (ref 1.15–1.40)
HCT: 24 % — ABNORMAL LOW (ref 39.0–52.0)
Hemoglobin: 8.2 g/dL — ABNORMAL LOW (ref 13.0–17.0)
O2 Saturation: 100 %
Patient temperature: 98.6
Potassium: 3.8 mmol/L (ref 3.5–5.1)
Sodium: 139 mmol/L (ref 135–145)
TCO2: 29 mmol/L (ref 22–32)
pCO2 arterial: 37.5 mmHg (ref 32.0–48.0)
pH, Arterial: 7.483 — ABNORMAL HIGH (ref 7.350–7.450)
pO2, Arterial: 548 mmHg — ABNORMAL HIGH (ref 83.0–108.0)

## 2020-10-14 LAB — CBC
HCT: 27.5 % — ABNORMAL LOW (ref 39.0–52.0)
Hemoglobin: 8.5 g/dL — ABNORMAL LOW (ref 13.0–17.0)
MCH: 24.4 pg — ABNORMAL LOW (ref 26.0–34.0)
MCHC: 30.9 g/dL (ref 30.0–36.0)
MCV: 78.8 fL — ABNORMAL LOW (ref 80.0–100.0)
Platelets: 284 10*3/uL (ref 150–400)
RBC: 3.49 MIL/uL — ABNORMAL LOW (ref 4.22–5.81)
RDW: 16 % — ABNORMAL HIGH (ref 11.5–15.5)
WBC: 7 10*3/uL (ref 4.0–10.5)
nRBC: 0 % (ref 0.0–0.2)

## 2020-10-14 LAB — GLUCOSE, CAPILLARY: Glucose-Capillary: 115 mg/dL — ABNORMAL HIGH (ref 70–99)

## 2020-10-14 LAB — BASIC METABOLIC PANEL
Anion gap: 7 (ref 5–15)
BUN: 10 mg/dL (ref 6–20)
CO2: 22 mmol/L (ref 22–32)
Calcium: 9.1 mg/dL (ref 8.9–10.3)
Chloride: 108 mmol/L (ref 98–111)
Creatinine, Ser: 0.53 mg/dL — ABNORMAL LOW (ref 0.61–1.24)
GFR, Estimated: >60 mL/min (ref >60)
Glucose, Bld: 107 mg/dL — ABNORMAL HIGH (ref 70–99)
Potassium: 4.3 mmol/L (ref 3.5–5.1)
Sodium: 137 mmol/L (ref 135–145)

## 2020-10-14 LAB — MAGNESIUM: Magnesium: 1.8 mg/dL (ref 1.7–2.4)

## 2020-10-14 LAB — TROPONIN I (HIGH SENSITIVITY): Troponin I (High Sensitivity): 26 ng/L — ABNORMAL HIGH (ref <18)

## 2020-10-14 LAB — ETHANOL: Alcohol, Ethyl (B): <10 mg/dL (ref <10)

## 2020-10-14 SURGERY — IR WITH ANESTHESIA
Anesthesia: Choice

## 2020-10-14 MED ORDER — VASOPRESSIN 20 UNIT/ML IV SOLN
INTRAVENOUS | Status: AC
  Filled 2020-10-14: qty 1

## 2020-10-14 MED ORDER — VASOPRESSIN 20 UNIT/ML IV SOLN
INTRAVENOUS | Status: DC | PRN
  Administered 2020-10-14: 1 [IU] via INTRAVENOUS

## 2020-10-14 MED ORDER — FENTANYL CITRATE (PF) 250 MCG/5ML IJ SOLN
INTRAMUSCULAR | Status: DC | PRN
  Administered 2020-10-14 (×2): 50 ug via INTRAVENOUS

## 2020-10-14 MED ORDER — ACETAMINOPHEN 650 MG RE SUPP: 650.0000 mg | RECTAL | Status: DC | PRN

## 2020-10-14 MED ORDER — IOHEXOL 350 MG/ML SOLN
100.0000 mL | Freq: Once | INTRAVENOUS | Status: AC | PRN
  Administered 2020-10-14: 100 mL via INTRAVENOUS

## 2020-10-14 MED ORDER — ACETAMINOPHEN 325 MG PO TABS
650.0000 mg | ORAL_TABLET | ORAL | Status: DC | PRN
  Administered 2020-10-17: 650 mg via ORAL
  Filled 2020-10-14: qty 2

## 2020-10-14 MED ORDER — CLOPIDOGREL BISULFATE 75 MG PO TABS
75.0000 mg | ORAL_TABLET | Freq: Every day | ORAL | Status: DC
  Administered 2020-10-15: 75 mg
  Filled 2020-10-14: qty 1

## 2020-10-14 MED ORDER — LIDOCAINE HCL (CARDIAC) PF 100 MG/5ML IV SOSY
PREFILLED_SYRINGE | INTRAVENOUS | Status: DC | PRN
  Administered 2020-10-14: 40 mg via INTRATRACHEAL

## 2020-10-14 MED ORDER — IOHEXOL 240 MG/ML SOLN
150.0000 mL | Freq: Once | INTRAMUSCULAR | Status: AC | PRN
  Administered 2020-10-14: 35 mL via INTRA_ARTERIAL

## 2020-10-14 MED ORDER — ENOXAPARIN SODIUM 40 MG/0.4ML IJ SOSY: 40.0000 mg | PREFILLED_SYRINGE | INTRAMUSCULAR | Status: DC

## 2020-10-14 MED ORDER — CLEVIDIPINE BUTYRATE 0.5 MG/ML IV EMUL: 0.0000 mg/h | INTRAVENOUS | Status: DC

## 2020-10-14 MED ORDER — MONTELUKAST SODIUM 10 MG PO TABS
10.0000 mg | ORAL_TABLET | Freq: Every day | ORAL | Status: DC
  Administered 2020-10-14: 10 mg
  Filled 2020-10-14 (×2): qty 1

## 2020-10-14 MED ORDER — CLOPIDOGREL BISULFATE 75 MG PO TABS
300.0000 mg | ORAL_TABLET | Freq: Once | ORAL | Status: AC
  Administered 2020-10-14: 300 mg
  Filled 2020-10-14: qty 4

## 2020-10-14 MED ORDER — SUCCINYLCHOLINE CHLORIDE 20 MG/ML IJ SOLN
INTRAMUSCULAR | Status: DC | PRN
  Administered 2020-10-14: 120 mg via INTRAVENOUS

## 2020-10-14 MED ORDER — GABAPENTIN 300 MG PO CAPS
300.0000 mg | ORAL_CAPSULE | Freq: Two times a day (BID) | ORAL | Status: DC
  Administered 2020-10-14 – 2020-10-15 (×2): 300 mg
  Filled 2020-10-14 (×2): qty 1

## 2020-10-14 MED ORDER — CHLORHEXIDINE GLUCONATE CLOTH 2 % EX PADS
6.0000 | MEDICATED_PAD | Freq: Every day | CUTANEOUS | Status: DC
  Administered 2020-10-14 – 2020-10-16 (×3): 6 via TOPICAL

## 2020-10-14 MED ORDER — SENNOSIDES-DOCUSATE SODIUM 8.6-50 MG PO TABS: 1.0000 | ORAL_TABLET | Freq: Every evening | ORAL | Status: DC | PRN

## 2020-10-14 MED ORDER — SODIUM CHLORIDE 0.9 % IV SOLN
INTRAVENOUS | Status: DC
Start: 2020-10-14 — End: 2020-10-15

## 2020-10-14 MED ORDER — ORAL CARE MOUTH RINSE
15.0000 mL | OROMUCOSAL | Status: DC
  Administered 2020-10-14 – 2020-10-15 (×2): 15 mL via OROMUCOSAL

## 2020-10-14 MED ORDER — PROPOFOL 1000 MG/100ML IV EMUL
5.0000 ug/kg/min | INTRAVENOUS | Status: DC
Start: 2020-10-14 — End: 2020-10-15
  Administered 2020-10-14: 25 ug/kg/min via INTRAVENOUS
  Filled 2020-10-14: qty 100

## 2020-10-14 MED ORDER — CLOPIDOGREL BISULFATE 75 MG PO TABS: 300.0000 mg | ORAL_TABLET | Freq: Once | ORAL | Status: DC

## 2020-10-14 MED ORDER — PROPOFOL 10 MG/ML IV BOLUS
INTRAVENOUS | Status: DC | PRN
  Administered 2020-10-14: 120 mg via INTRAVENOUS

## 2020-10-14 MED ORDER — HEPARIN (PORCINE) 25000 UT/250ML-% IV SOLN
950.0000 [IU]/h | INTRAVENOUS | Status: DC
  Administered 2020-10-14: 800 [IU]/h via INTRAVENOUS
  Filled 2020-10-14: qty 250

## 2020-10-14 MED ORDER — CANGRELOR TETRASODIUM 50 MG IV SOLR
INTRAVENOUS | Status: AC
  Filled 2020-10-14: qty 50

## 2020-10-14 MED ORDER — ACETAMINOPHEN 160 MG/5ML PO SOLN: 650.0000 mg | ORAL | Status: DC | PRN

## 2020-10-14 MED ORDER — VERAPAMIL HCL 2.5 MG/ML IV SOLN
INTRAVENOUS | Status: AC
  Filled 2020-10-14: qty 2

## 2020-10-14 MED ORDER — CLOPIDOGREL BISULFATE 75 MG PO TABS: 75.0000 mg | ORAL_TABLET | Freq: Every day | ORAL | Status: DC

## 2020-10-14 MED ORDER — ROCURONIUM BROMIDE 100 MG/10ML IV SOLN
INTRAVENOUS | Status: DC | PRN
  Administered 2020-10-14 (×2): 50 mg via INTRAVENOUS

## 2020-10-14 MED ORDER — SODIUM CHLORIDE 0.9 % IV SOLN: INTRAVENOUS | Status: DC | PRN

## 2020-10-14 MED ORDER — PHENYLEPHRINE HCL-NACL 10-0.9 MG/250ML-% IV SOLN
INTRAVENOUS | Status: DC | PRN
  Administered 2020-10-14: 60 ug/min via INTRAVENOUS

## 2020-10-14 MED ORDER — STROKE: EARLY STAGES OF RECOVERY BOOK
Freq: Once | Status: AC
  Filled 2020-10-14: qty 1

## 2020-10-14 MED ORDER — CHLORHEXIDINE GLUCONATE 0.12% ORAL RINSE (MEDLINE KIT)
15.0000 mL | Freq: Two times a day (BID) | OROMUCOSAL | Status: DC
  Administered 2020-10-14: 15 mL via OROMUCOSAL

## 2020-10-14 NOTE — Consult Note (Addendum)
Interventional Cardiology Consultation:   Patient ID: Jonathan [name] MRN: [id number]; DOB: Oct 25, 1960  Admit date: 10/09/2020 Date of Consult: 10/14/2020  PCP:  System, Provider Not In   [facility] Providers: Cardiologist:  Minus Breeding, MD  (has never been seen in clinic since the May 2021 admission) Vascular Surgeon: Dr. Scot Dock (currently being seen by Dr. Donnetta Hutching)   Patient Profile:   Jonathan [name] is a 60 y.o. male with a hx of known Single-Vessel CAD (CTO of the RCA with Wide-Open LAD and LCx-OM's by Cath on 09/28/2020) with Ischemic Cardiomyopathy (EF 30 to 35% as of echo on 09/26/2020 with inferior septal akinesis, GRII DD-consistent with known CTO of the RCA), Chronic Combined Systolic and Diastolic Heart Failure, Chronic Venous Insufficiency (Venous Hypertension-Status Post Bilateral GSV Laser Ablations) with Nonhealing Wounds (by report no evidence of lower extremity arterial disease), and Rheumatoid Arthritis who is being seen 10/14/2020 for the evaluation of abnormal EKG-diffuse ST elevations on EKG at the request of Dr. Quinn Axe from Neurology.  History of Present Illness:   Jonathan [name] is a very complicated 60 year old gentleman with a significant history reviewed.  He actually was recently admitted earlier this month (June 4-7, 2022) with chest pain and CHF.  Was treated with IV Lasix and oxygen.  EF was noted to be considerably reduced on echocardiogram, and troponins were elevated.  He underwent cardiac catheterization with right and left heart cath revealing stable severe single-vessel CAD with occluded RCA (CTO) with widely patent left coronary artery system including a large LAD, as well as circumflex with multiple OM's.  He was actively diuresed and discharged in relatively stable condition.  At this time, he was noted to have some aphasia with difficulty speech, along with some underlying concern for autism.  Following his discharge earlier this month, he was admitted  to [facility] with suspected possible osteomyelitis related to onychomycosis-worsening chronic left foot ulcer.  Dr. Cannon Kettle from podiatry performed fourth metatarsal head excision, I&D with graft application.  He has been maintained on vancomycin and Zosyn with cultures pending.  Plan 10-day antibiotics.  Vascular surgery has been called salted.  No signs of lower EXTR arterial disease.  Has been maintained on home dose of Lasix, Aldactone Imdur as well as metoprolol and lisinopril.  No active CHF symptoms.  Has had relatively low blood pressures.  He was doing relatively well, recovering today at [facility], and was found by the nurse at roughly 4:55PM with a flaccid right arm and right, right facial droop with aphasia (found on vital sign check).  Rapid response called, head CT at 1705.  Neurology telemetry consult performed after reviewing CTA which showed left M2 occlusion with 49 cc penumbra.  Code IR was activated for the patient be transported to [facility] for emergent thrombectomy.  When he was picked up by [facility] for transfer, they checked a twelve-lead EKG which was relatively abnormal with unusual ST elevations in inferior and anterolateral leads.  Cardiology was asked to consult.   Past Medical History:  Diagnosis Date   Anemia    H/o using iron in the past    Arthritis    rheumatoid   Chronic back pain    Chronic HFrEF (heart failure with reduced ejection fraction) ([place]) 08/2019   Admitted 6 4-7/20/2022 with CHF   Coronary artery disease, occlusive 08/01/2019   RCA CTO with left-to-right collaterals.  Widely patent LCA-large LAD was several small diagonal branches and small diffusely diseased LCx with multiple OM branches.  Hyperlipidemia    Ischemic cardiomyopathy 09/02/2019   Initial EF was 40 to 45%, as of June 2022 EF now 30 to 35%.  Known RCA occlusion.  There is inferior and inferoseptal akinesis on echo.   Joint pain    Joint swelling    Rheumatoid  arthritis(714.0)     Past Surgical History:  Procedure Laterality Date   COLONOSCOPY N/A 04/03/2013   Procedure: COLONOSCOPY;  Surgeon: Irene Shipper, MD;  Location: [facility];  Service: Endoscopy;  Laterality: N/A;   COLONOSCOPY     ENDOVENOUS ABLATION SAPHENOUS VEIN W/ LASER Left 09/26/2019   endovenous laser ablation left greater saphenous vein by Gae Gallop MD    ENDOVENOUS ABLATION SAPHENOUS VEIN W/ LASER Right 10/31/2019   endovenous laser ablation right greater saphenous vein by Gae Gallop MD    ESOPHAGOGASTRODUODENOSCOPY N/A 04/03/2013   Procedure: ESOPHAGOGASTRODUODENOSCOPY (EGD);  Surgeon: Irene Shipper, MD;  Location: [facility];  Service: Endoscopy;  Laterality: N/A;  changed to moderate dr. Henrene Pastor did not want to go to the o.r. for an endo colon-jmt   EYE SURGERY Left    "when i was a very small boy"   GRAFT APPLICATION Left 83/41/9622   Procedure: GRAFT APPLICATION;  Surgeon: Landis Martins, DPM;  Location: [facility];  Service: Podiatry;  Laterality: Left;  [place] (rep already contacted: Matt Sides)   INCISION AND DRAINAGE OF WOUND Left 10/11/2020   Procedure: IRRIGATION AND DEBRIDEMENT WOUND;  Surgeon: Landis Martins, DPM;  Location: [facility];  Service: Podiatry;  Laterality: Left;   JOINT REPLACEMENT     both knees   KNEE ARTHROPLASTY  06/07/2011   Procedure: COMPUTER ASSISTED TOTAL KNEE ARTHROPLASTY;  Surgeon: Mcarthur Rossetti, MD;  Location: [place];  Service: Orthopedics;  Laterality: Right;  Right total knee arthoplasty   KNEE CLOSED REDUCTION  07/19/2011   Procedure: CLOSED MANIPULATION KNEE;  Surgeon: Mcarthur Rossetti, MD;  Location: [place];  Service: Orthopedics;  Laterality: Right;  Manipulation under anesthesia right knee   LEFT HEART CATH AND CORONARY ANGIOGRAPHY N/A 08/21/2019   Procedure: LEFT HEART CATH AND CORONARY ANGIOGRAPHY;  Surgeon: Belva Crome, MD;  Location: [place] [facility];  Service: Cardiovascular; Mid to distal RCA CTO with faint  left-to-right collaterals.  Widely patent left main.  Mid to distal LCx mild luminal irregularities.  Multiple OM branches.  Large draping LAD with several small diagonal branches.  EF ~40 to 45% with inferior AK.   METATARSAL HEAD EXCISION Left 10/11/2020   Procedure: METATARSAL HEAD EXCISION;  Surgeon: Landis Martins, DPM;  Location: [facility];  Service: Podiatry;  Laterality: Left;  4th metatarsal   RIGHT/LEFT HEART CATH AND CORONARY ANGIOGRAPHY N/A 09/28/2020   Procedure: RIGHT/LEFT HEART CATH AND CORONARY ANGIOGRAPHY;  Surgeon: Nelva Bush, MD;  Location: [place] [facility];  Service: Cardiovascular;; Single-vessel CAD with CTO of mid RCA.  Mild disease involving the LCA (LAD & LCx w/ multiple OM).  Stable since 2021.  Mild to mod elevated LVEDP.  Normal-mildly reduced CO/CI - 5.2, 3.3 (Fick), 4.6, 2.8 by (TD). RA 7 , RV 32/7, PA 29/20, PCWP 22.   TOTAL HIP ARTHROPLASTY Right 11/11/2014   Procedure: RIGHT TOTAL HIP ARTHROPLASTY ANTERIOR APPROACH;  Surgeon: Mcarthur Rossetti, MD;  Location: [place];  Service: Orthopedics;  Laterality: Right;   TOTAL HIP ARTHROPLASTY Left 01/29/2015   Procedure: LEFT TOTAL HIP ARTHROPLASTY ANTERIOR APPROACH;  Surgeon: Mcarthur Rossetti, MD;  Location: [place];  Service: Orthopedics;  Laterality: Left;  TRANSTHORACIC ECHOCARDIOGRAM  08/22/2019   EF 50 to 55%.  GR 1 DD. Mild Inferior Hypokinesis.   TRANSTHORACIC ECHOCARDIOGRAM  09/27/2019   EF 30-35%. Moderately reduced LVEF with Inferior/Inferoseptal Akinesis. Elevated LAP with mild LA dilation - Gr II DD. Mild-Mod AI, no AS.     Home Medications:  Prior to Admission medications   Medication Sig Start Date End Date Taking? Authorizing Provider  acetaminophen (TYLENOL) 325 MG tablet Take 2 tablets (650 mg total) by mouth every 4 (four) hours as needed for headache or mild pain. 08/24/19  Yes Allie Bossier, MD  aspirin EC 81 MG tablet Take 1 tablet (81 mg total) by mouth daily. 08/27/19  Yes Minus Breeding, MD  atorvastatin (LIPITOR) 40 MG tablet Take 40 mg by mouth daily.   Yes [provider]  celecoxib (CELEBREX) 200 MG capsule Take by mouth. 09/14/20 12/13/20 Yes [provider]  cetirizine (ZYRTEC) 10 MG tablet Take 10 mg by mouth daily as needed for allergies. 07/16/18  Yes [provider]  docusate sodium (COLACE) 100 MG capsule Take 1 capsule (100 mg total) by mouth 2 (two) times daily. Patient taking differently: Take 100 mg by mouth daily as needed for mild constipation. 08/24/19  Yes Allie Bossier, MD  fluticasone [facility]) 50 MCG/ACT nasal spray Place 1 spray into the nose daily as needed for allergies. 07/16/18  Yes [provider]  furosemide (LASIX) 40 MG tablet Take 1 tablet (40 mg total) by mouth daily. 09/30/20  Yes Jennye Boroughs, MD  gabapentin (NEURONTIN) 300 MG capsule Take 300 mg by mouth 2 (two) times daily.  03/24/16  Yes [provider]  isosorbide mononitrate (IMDUR) 60 MG 24 hr tablet Take 1 tablet (60 mg total) by mouth daily. 08/25/19  Yes Allie Bossier, MD  leflunomide (ARAVA) 10 MG tablet Take 1 tablet by mouth daily. 05/20/20  Yes [provider]  lisinopril (ZESTRIL) 5 MG tablet Take 10 mg by mouth daily. 02/13/20 02/12/21 Yes [provider]  metoprolol succinate (TOPROL-XL) 25 MG 24 hr tablet Take 25 mg by mouth daily. 09/09/20 03/08/21 Yes [provider]  montelukast (SINGULAIR) 10 MG tablet Take 10 mg by mouth at bedtime.   Yes [provider]  omeprazole (PRILOSEC) 20 MG capsule Take 20 mg by mouth daily.    Yes [provider]  predniSONE (DELTASONE) 5 MG tablet Take 5 mg by mouth 2 (two) times daily with a meal. 07/03/20  Yes [provider]  spironolactone (ALDACTONE) 25 MG tablet Take 25 mg by mouth daily. 09/09/20  Yes [provider]  triamcinolone (KENALOG) 0.1 % Apply 1 application topically daily as needed (to flaky skin). 05/07/20  Yes [provider]  rivaroxaban (XARELTO) 10 MG TABS tablet Take 10 mg by mouth daily with breakfast. 06/10/11 07/19/11  Mcarthur Rossetti, MD    Inpatient Medications: Scheduled Meds:   stroke: mapping our early stages of recovery book   Does not apply Once   aspirin EC  81 mg Oral Daily   atorvastatin  40 mg Oral Daily   docusate sodium  100 mg Oral BID   enoxaparin (LOVENOX) injection  40 mg Subcutaneous Q24H   gabapentin  300 mg Oral BID   montelukast  10 mg Oral QHS   predniSONE  5 mg Oral BID WC   Continuous Infusions:  sodium chloride     piperacillin-tazobactam (ZOSYN)  IV Stopped (10/14/20 1650)   vancomycin Stopped (10/14/20 0232)  PRN Meds: acetaminophen **OR** acetaminophen (TYLENOL) oral liquid 160 mg/5 mL **OR** acetaminophen, fluticasone, morphine injection, ondansetron **OR** ondansetron (ZOFRAN) IV, senna-docusate  Allergies:    Allergies  Allergen Reactions   Infliximab Nausea Only and Shortness Of Breath    Social History:   Social History   Socioeconomic History   Marital status: Single    Spouse name: Not on file   Number of children: Not on file   Years of education: Not on file   Highest education level: Not on file  Occupational History   Occupation: Disabled  Tobacco Use   Smoking status: Former    Pack years: 0.00    Types: Cigarettes    Quit date: 04/25/2004    Years since quitting: 16.4   Smokeless tobacco: Never  Vaping Use   Vaping Use: Never used  Substance and Sexual Activity   Alcohol use: No   Drug use: No   Sexual activity: Never  Other Topics Concern   Not on file  Social History Narrative   Not on file   Social Determinants of Health   Financial Resource Strain: Not on file  Food Insecurity: Not on file  Transportation Needs: Not on file  Physical Activity: Not on file  Stress: Not on file  Social Connections: Not on file  Intimate Partner Violence: Not on file    Family History:    Family History  Problem  Relation Age of Onset   Cancer Mother        ?   Hypertension Mother    Heart failure Father        heart transplant   Cancer Brother    Anesthesia problems Neg Hx    Colon cancer Neg Hx        mother had "3" types of CA- unsure about which one   Esophageal cancer Neg Hx    Rectal cancer Neg Hx    Stomach cancer Neg Hx      ROS:  Please see the history of present illness.  Unable to obtain.  Upon my evaluation he is intubated and sedated All other ROS reviewed and negative.     Physical Exam/Data:   Vitals:   10/14/20 0509 10/14/20 0511 10/14/20 1027 10/14/20 1649  BP: 111/70 110/69 107/66 109/68  Pulse: 98 78 96 99  Resp: 16 16 15 17   Temp: 98 F (36.7 C) 98.9 F (37.2 C) 98.6 F (37 C)   TempSrc:  Oral Oral   SpO2: 98% 98% 96% 99%  Weight:      Height:        Intake/Output Summary (Last 24 hours) at 10/14/2020 2018 Last data filed at 10/14/2020 1937 Gross per 24 hour  Intake 1280 ml  Output 2775 ml  Net -1495 ml   Last 3 Weights 10/14/2020 10/11/2020 10/10/2020  Weight (lbs) 138 lb 14.2 oz 127 lb 13.9 oz 128 lb 15.5 oz  Weight (kg) 63 kg 58 kg 58.5 kg     Body mass index is 24.6 kg/m.  Patient seen and examined in the IR suite General: Somewhat thin, frail gentleman who appears older than stated age.  Intubated and sedated.  Hemodynamically stable. HEENT: Difficult to assess-intubated Neck: no JVD, bruit. Vascular: No carotid bruits; FA pulses 2+ bilaterally without bruits  Cardiac:  normal S1, S2; RRR; no murmur, or rub, cannot exclude gallop. Lungs: Intubated, CTA B Abd: soft, nontender, no hepatomegaly  Ext: Trace right ankle edema, left foot dressed-unable assess.  Pulses 2+.  Thickening Musculoskeletal: Left foot wound dressing CDI Skin: warm and dry  Neuro: Intubated and sedated-status post IR left MCA-M2 thrombectomy  EKG:  The EKG was personally reviewed and demonstrates:  NSR with unusual ST E in I, II, III, aVF, V4-V6 (somewhat downsloping with  concave ST V2-V3).  --> Diffuse STE - by definition does meet STEMI criteria Telemetry:  Telemetry was personally reviewed and demonstrates:  SR  Relevant CV Studies: Left Heart Cath 08/01/2019 (images personally reviewed): Mid to distal RCA CTO with faint left-to-right collaterals.  Widely patent left main.  Mid to distal LCx mild luminal irregularities.  Multiple OM branches.  Large draping LAD with several small diagonal branches.  EF estimated 40 to 45% with inferior akinesis. Transthoracic Echo 08/22/2019 (personally reviewed): EF 50 to 55%.  GR 1 DD. Mild Inferior Hypokinesis.   Transthoracic Echo 09/26/2020:  EF 30-35%. Moderately reduced LVEF with Inferior/Inferoseptal Akinesis. Elevated LAP with mild LA dilation - Gr II DD. Mild-Mod AI, no AS. Right/Left Heart Cath 09/28/2020 (images personally reviewed): Single-vessel CAD with CTO of mid RCA.  Mild disease involving the left coronary artery.  Stable since 2021.  Mild to moderately elevated LV filling pressures.  Normal-mildly reduced Cardiac Output and Index 5.2, 3.3 by Fick, 4.6, 2.8 by TD. RA mean 7 mmHg, RV 32/7 mmHg, PA 29/20 mmHg, PCWP 22 mmHg.  Dominance: Right    Laboratory Data:  High Sensitivity Troponin:   Recent Labs  Lab 09/26/20 0133 09/26/20 0405 09/26/20 1315  TROPONINIHS 107* 144* 84*     Chemistry Recent Labs  Lab 10/11/20 0409 10/12/20 0400 10/14/20 0321  NA 133* 137 137  K 3.7 3.6 4.3  CL 102 106 108  CO2 25 25 22   GLUCOSE 110* 93 107*  BUN 8 8 10   CREATININE 0.68  0.55* 0.61 0.53*  CALCIUM 9.2 9.1 9.1  GFRNONAA >60  >60 >60 >60  ANIONGAP 6 6 7     Recent Labs  Lab 10/10/20 0351 10/11/20 0409 10/12/20 0400  PROT 6.5  --   --   ALBUMIN 2.9* 2.9* 2.5*  AST 19  --   --   ALT 11  --   --   ALKPHOS 80  --   --   BILITOT 0.4  --   --    Hematology Recent Labs  Lab 10/11/20 0409 10/12/20 0400 10/14/20 0321  WBC 9.2 10.1 7.0  RBC 3.55* 3.80* 3.49*  HGB 8.8* 9.2* 8.5*  HCT 28.0* 29.9* 27.5*   MCV 78.9* 78.7* 78.8*  MCH 24.8* 24.2* 24.4*  MCHC 31.4 30.8 30.9  RDW 15.8* 15.9* 16.0*  PLT 272 277 284   BNPNo results for input(s): BNP, PROBNP in the last 168 hours.  DDimer No results for input(s): DDIMER in the last 168 hours.   Radiology/Studies:  CT HEAD CODE STROKE WO CONTRAST`  Result Date: 10/14/2020 CLINICAL DATA:  Code stroke. Right facial droop. Right-sided weakness. EXAM: CT HEAD WITHOUT CONTRAST TECHNIQUE: Contiguous axial images were obtained from the base of the skull through the vertex without intravenous contrast. COMPARISON:  None. FINDINGS: Brain: Abnormal edema and loss of gray-white differentiation in the left basal ganglia (including the caudate and lentiform nucleus), concerning for acute or subacute infarct. Mild associated mass effect with approximately 2 mm of rightward midline shift at the foramen of Monro. Vascular: No hyperdense vessel identified. Calcific atherosclerosis. Skull: No acute fracture. Sinuses/Orbits: Clear visualized sinuses.  Unremarkable orbits. Other: Small right mastoid effusion. ASPECTS [facility] Stroke Program Early CT Score) -  Ganglionic level infarction (caudate, lentiform nuclei, internal capsule, insula, M1-M3 cortex): 5 - Supraganglionic infarction (M4-M6 cortex): 3 Total score (0-10 with 10 being normal): 8 IMPRESSION: 1. Abnormal edema and loss of gray-white differentiation in the left basal ganglia (including the caudate and lentiform nucleus), concerning for acute or subacute infarct. Recommend MRI to confirm and exclude other etiologies. 2. Mild associated mass effect with approximately 2 mm of rightward midline shift at the foramen of Monro. 3. No acute hemorrhage. Findings discussed with Dr. Dwyane Dee via telephone at 5:18 p.m. Electronically Signed   By: Margaretha Sheffield MD   On: 10/14/2020 17:22   CT ANGIO HEAD NECK W WO CM W PERF (CODE STROKE)  Result Date: 10/14/2020 CLINICAL DATA:  Neuro deficit, acute stroke suspected. EXAM: CT  ANGIOGRAPHY HEAD AND NECK CT PERFUSION BRAIN TECHNIQUE: Multidetector CT imaging of the head and neck was performed using the standard protocol during bolus administration of intravenous contrast. Multiplanar CT image reconstructions and MIPs were obtained to evaluate the vascular anatomy. Carotid stenosis measurements (when applicable) are obtained utilizing NASCET criteria, using the distal internal carotid diameter as the denominator. Multiphase CT imaging of the brain was performed following IV bolus contrast injection. Subsequent parametric perfusion maps were calculated using RAPID software. CONTRAST:  159mL OMNIPAQUE IOHEXOL 350 MG/ML SOLN COMPARISON:  None. FINDINGS: CTA NECK FINDINGS Aortic arch: Great vessel origins are patent. Mild narrowing of the left subclavian artery origin. Right carotid system: No evidence of dissection, stenosis (50% or greater) or occlusion. Tortuous ICA with retropharyngeal course. Mild atherosclerosis at the carotid bifurcation. Left carotid system: No evidence of dissection, stenosis (50% or greater) or occlusion. Mild atherosclerosis at the carotid bifurcation. Vertebral arteries: Right dominant. The non dominant left vertebral artery is small throughout its course with small transverse foramen. Right vertebral artery is patent without significant (greater than 50%) stenosis. Skeleton: Other neck: Enlarged bilateral parotid ducts, left greater than right without definite radiodense calculus, although evaluation is limited on the CTA. Upper chest: Layering bilateral pleural effusions in the visualized lung apices with overlying ground-glass opacities Review of the MIP images confirms the above findings CTA HEAD FINDINGS Anterior circulation: Bilateral ICAs are patent with calcific atherosclerosis, but no significant (greater than 50%) stenosis. Left M1 MCAs patent. Occlusion versus severe stenosis of a proximal left M2 MCA branch (see series 8, images 121 through 123; series  10, images 120 5 through 128) with irregular distal opacification. Right MCA and bilateral ACAs are patent. No aneurysm identified. Posterior circulation: Bilateral intradural vertebral arteries and the basilar artery are patent. Bilateral fetal type PCAs without evidence of proximal hemodynamically significant PCA stenosis. Limited evaluation of the distal PCAs due to venous contamination. No aneurysm identified. Venous sinuses: As permitted by contrast timing, patent. Anatomic variants: See above. Review of the MIP images confirms the above findings CT Brain Perfusion Findings: ASPECTS: 8 CBF (<30%) Volume: 41mL Perfusion (Tmax>6.0s) volume: 45mL Mismatch Volume: 44mL. The amount of penumbra may be underestimated given the more superior left MCA territory is not imaged on perfusion Infarction Location:None identified IMPRESSION: 1. 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 Associated 49 mL of penumbra in the left MCA territory. The volume of penumbra may be underestimated given the more superior left MCA territory is not imaged on perfusion. No evidence of core infarct on RAPID analysis. 2. Layering bilateral pleural effusions in the visualized lung apices with overlying ground-glass opacities, potentially atelectasis. Recommend dedicated chest  imaging to further characterize. 3. Significantly enlarged bilateral parotid ducts, left greater than right without definite radiodense calculus, although evaluation is limited on this CTA. Bilateral overlying subcutaneous stranding could represent anasarca, although parotiditis is not excluded. Recommend clinical correlation and consideration of follow-up CT neck (preferably with contrast) after resolution of the patient's emergent issues. Critical findings discussed with Dr. Quinn Axe At 5:44 p.m. via telephone. Electronically Signed   By: Margaretha Sheffield MD   On: 10/14/2020 18:14      Assessment and Plan:   Left M2 thrombotic CVA: s/p thrombectomy with TIMI 3 FLOW restored Abnormal EKG suggesting diffuse ischemia versus neurologic event with known Single-Vessel CAD and Ischemic Cardiomyopathy  Based on the fact that he is just had a heart catheterization earlier this month with widely patent LAD, it is unlikely that he has a new occlusion of the LAD.  Findings are likely related to his ongoing neurologic event.  It would be very unlikely that he will be having a new MI without hemodynamic instability based on the fact that he already has reduced EF and known RCA occlusion.  LAD infarct would likely be nearly catastrophic.  Regardless, with ongoing strokes, the best course of action is to proceed with thrombectomy to treat the active stroke.  Would manage cardiac etiology medically. At this point, he is hemodynamically stable, would reassess blood pressures tomorrow prior to restarting CHF medications in in order to maintain adequate BP for stroke perfusion. Would recheck EKG in Neuro ICU, depending on what EKGs look like, may consider IV heparin x48 hours. 1 potential etiology for stroke could be LV thrombus with thromboembolism -> Recommend rechecking 2D echocardiogram with Definity contrast in the morning If okay with IR (confirmed by Dr. Debbrah Alar) -> would treat with IV heparin until we have cleared possible LV thrombus. Cycle cardiac enzymes, if this is truly anterior MI, would expect significant troponin elevation (over 20,000) as opposed to minimal troponin elevation Continue statin, and okay with IR, would consider aspirin/Plavix for secondary stroke prevention as well as possible ACS. Would not consider relook catheterization in the setting of acute stroke unless he becomes hemodynamically unstable.  Prior to the code stroke, he seemed to be stable with likely class I-II CHF symptoms on his current regimen.  As his blood pressure normalizes, we can restart his CHF  medications and potentially consider switching from lisinopril to [facility].  This can be done by the rounding cardiology service.   Risk Assessment/Risk Scores:     TIMI Risk Score for ST  Elevation MI:   The patient's TIMI risk score is 5, which indicates a 12.4% risk of all cause mortality at 30 days.    Actual presence of ST elevation MI is not 233% certain.  Unlikely to have anterior STEMI in the setting of stroke.  EKG changes most likely consistent with neurologic process.      For questions or updates, please contact [place] Please consult [email or web address] for contact info under    Signed, Glenetta Hew, MD  10/14/2020 8:18 PM

## 2020-10-14 NOTE — Telephone Encounter (Signed)
I attempted to reach patient to remind him about his scheduled appt in the [facility] on Friday.  He is currently hospitalized per the person who answered the phone and had toe surgery.  I will consult with our HF transition team and will likely need to reschedule his appt to a later time.

## 2020-10-14 NOTE — Progress Notes (Signed)
Pharmacy Antibiotic Note  Jonathan [name] is a 60 y.o. male admitted on 10/09/2020 with chronic wound to dorsum of his left foot  Patient is now s/p amputation with I/D, cultures showing pan sensitive proteus and GPC. Micro endorses likely CONS also growing on cx will have speciation tomorrow. Pathology report negative for osteomyelitis, likely only needs a few more days of oral abx. Provider would like to wait one more day for cultures before de-escalating. Likely doxy/augmentin vs augmetin alone would be a good option to complete 10 days. For now, pharmacy has been consulted to dose vancomycin and zosyn for wound infection.  Plan: Vancomycin 1gm IV x 1 then 1750mg  q24h (AUC 496.4, Scr 0.67) Zosyn 3.375g IV Q8H infused over 4hrs. Daily SCr Follow renal function, cultures and clinical course  Height: 5\' 3"  (160 cm) Weight: 63 kg (138 lb 14.2 oz) IBW/kg (Calculated) : 56.9  Temp (24hrs), Avg:98.6 F (37 C), Min:98 F (36.7 C), Max:98.9 F (37.2 C)  Recent Labs  Lab 10/09/20 1859 10/10/20 0351 10/11/20 0409 10/12/20 0400 10/14/20 0321  WBC 6.0 4.7 9.2 10.1 7.0  CREATININE 0.67 0.57* 0.68  0.55* 0.61 0.53*     Estimated Creatinine Clearance: 80 mL/min (A) (by C-G formula based on SCr of 0.53 mg/dL (L)).    Allergies  Allergen Reactions   Infliximab Nausea Only and Shortness Of Breath    Antimicrobials this admission: Vanc 6/17 >>  Zosyn 6/17 >>   Dose adjustments this admission:   Microbiology results:    Thank you for allowing pharmacy to be a part of this patient's care.  Nicoletta Dress, PharmD, BCIDP Infectious Disease Pharmacist  Phone: [phone] 10/14/2020, 12:55 PM

## 2020-10-14 NOTE — Care Plan (Signed)
RN reported patient had right-sided flaccid weakness, he was found lethargic.  Code stroke activated.   CT head shows left MCA stroke.  Telemetry neurology has completed assessment.  Patient is out of tPA window. Patient is being transferred to [facility] for emergency thrombectomy. Discussed with Dr. Su Monks.

## 2020-10-14 NOTE — H&P (Addendum)
NEUROLOGY CONSULTATION NOTE   Date of service: October 14, 2020 Patient Name: Jonathan [name] MRN:  [id number] DOB:  11-25-1960  _ _ _   _ __   _ __ _ _  __ __   _ __   __ _  History of Present Illness  Jonathan [name] is a 60 y.o. male with PMH significant for autism spectrum disorder, RA, systolic CHF, chronic venous stasis with chronic left foot ulcer and recent onychomycosis s/p debridement who was admitted with left foot ulcer and osteomyelitis on Vanc and zosyn.  Code stroke was activated inpatient today for acute onset aphasia and R sided weakness with a LKW of 1300. CTH was negative for an acute stroke. CT angio head and neck with a L MCA M1 occlusion vs severe stenosis. CT Perfusion with a 47ml penumbra with no core.  He was transferred to [facility] for potential thrombectomy. EKG during transport was notable for ST Elevations. Discussed with Dr. Ellyn Hack with cardiology who looked through recent cardiac cath on 09/28/20 and did not think that his LAD was occluded. Thought this was probably from RCA occlusion. He was okay with going ahead with the thrombectomy at this time.  mRS: 4 tPA: outside window Thrombectomy: Yes, L MCA M2 occlusion vs severe stenosis.  NIHSS components Score: Comment  1a Level of Conscious 0[]  1[x]  2[]  3[]      1b LOC Questions 0[]  1[]  2[x]       1c LOC Commands 0[]  1[]  2[x]       2 Best Gaze 0[]  1[x]  2[]       3 Visual 0[]  1[x]  2[]  3[]      4 Facial Palsy 0[]  1[]  2[x]  3[]      5a Motor Arm - left 0[]  1[x]  2[]  3[]  4[]  UN[]    5b Motor Arm - Right 0[]  1[]  2[]  3[]  4[x]  UN[]    6a Motor Leg - Left 0[]  1[]  2[x]  3[]  4[]  UN[]    6b Motor Leg - Right 0[]  1[]  2[]  3[]  4[x]  UN[]    7 Limb Ataxia 0[x]  1[]  2[]  3[]  UN[]     8 Sensory 0[]  1[]  2[x]  UN[]      9 Best Language 0[]  1[]  2[]  3[x]      10 Dysarthria 0[x]  1[]  2[]  UN[]      11 Extinct. and Inattention 0[]  1[x]  2[]       TOTAL: 26      ROS  Unable to obtain 2/2 aphasia,.  Past History   Past Medical History:   Diagnosis Date   Anemia    H/o using iron in the past    Arthritis    rheumatoid   Chronic back pain    Hyperlipidemia    Joint pain    Joint swelling    Rheumatoid arthritis(714.0)    Past Surgical History:  Procedure Laterality Date   COLONOSCOPY N/A 04/03/2013   Procedure: COLONOSCOPY;  Surgeon: Irene Shipper, MD;  Location: [facility];  Service: Endoscopy;  Laterality: N/A;   COLONOSCOPY     ENDOVENOUS ABLATION SAPHENOUS VEIN W/ LASER Left 09/26/2019   endovenous laser ablation left greater saphenous vein by Gae Gallop MD    ENDOVENOUS ABLATION SAPHENOUS VEIN W/ LASER Right 10/31/2019   endovenous laser ablation right greater saphenous vein by Gae Gallop MD    ESOPHAGOGASTRODUODENOSCOPY N/A 04/03/2013   Procedure: ESOPHAGOGASTRODUODENOSCOPY (EGD);  Surgeon: Irene Shipper, MD;  Location: [facility];  Service: Endoscopy;  Laterality: N/A;  changed to moderate dr. Henrene Pastor did not want to go to the o.r. for an endo colon-jmt  EYE SURGERY Left    "when i was a very small boy"   GRAFT APPLICATION Left 6/96/2952   Procedure: GRAFT APPLICATION;  Surgeon: Landis Martins, DPM;  Location: [facility];  Service: Podiatry;  Laterality: Left;  [place] (rep already contacted: Matt Sides)   INCISION AND DRAINAGE OF WOUND Left 10/11/2020   Procedure: IRRIGATION AND DEBRIDEMENT WOUND;  Surgeon: Landis Martins, DPM;  Location: [facility];  Service: Podiatry;  Laterality: Left;   JOINT REPLACEMENT     both knees   KNEE ARTHROPLASTY  06/07/2011   Procedure: COMPUTER ASSISTED TOTAL KNEE ARTHROPLASTY;  Surgeon: Mcarthur Rossetti, MD;  Location: [place];  Service: Orthopedics;  Laterality: Right;  Right total knee arthoplasty   KNEE CLOSED REDUCTION  07/19/2011   Procedure: CLOSED MANIPULATION KNEE;  Surgeon: Mcarthur Rossetti, MD;  Location: [place];  Service: Orthopedics;  Laterality: Right;  Manipulation under anesthesia right knee   LEFT HEART CATH AND CORONARY ANGIOGRAPHY N/A 08/21/2019    Procedure: LEFT HEART CATH AND CORONARY ANGIOGRAPHY;  Surgeon: Belva Crome, MD;  Location: [place] [facility];  Service: Cardiovascular;  Laterality: N/A;   METATARSAL HEAD EXCISION Left 10/11/2020   Procedure: METATARSAL HEAD EXCISION;  Surgeon: Landis Martins, DPM;  Location: [facility];  Service: Podiatry;  Laterality: Left;  4th metatarsal   RIGHT/LEFT HEART CATH AND CORONARY ANGIOGRAPHY N/A 09/28/2020   Procedure: RIGHT/LEFT HEART CATH AND CORONARY ANGIOGRAPHY;  Surgeon: Nelva Bush, MD;  Location: [place] [facility];  Service: Cardiovascular;  Laterality: N/A;   TOTAL HIP ARTHROPLASTY Right 11/11/2014   Procedure: RIGHT TOTAL HIP ARTHROPLASTY ANTERIOR APPROACH;  Surgeon: Mcarthur Rossetti, MD;  Location: [place];  Service: Orthopedics;  Laterality: Right;   TOTAL HIP ARTHROPLASTY Left 01/29/2015   Procedure: LEFT TOTAL HIP ARTHROPLASTY ANTERIOR APPROACH;  Surgeon: Mcarthur Rossetti, MD;  Location: [place];  Service: Orthopedics;  Laterality: Left;   Family History  Problem Relation Age of Onset   Cancer Mother        ?   Hypertension Mother    Heart failure Father        heart transplant   Cancer Brother    Anesthesia problems Neg Hx    Colon cancer Neg Hx        mother had "3" types of CA- unsure about which one   Esophageal cancer Neg Hx    Rectal cancer Neg Hx    Stomach cancer Neg Hx    Social History   Socioeconomic History   Marital status: Single    Spouse name: Not on file   Number of children: Not on file   Years of education: Not on file   Highest education level: Not on file  Occupational History   Occupation: Disabled  Tobacco Use   Smoking status: Former    Pack years: 0.00    Types: Cigarettes    Quit date: 04/25/2004    Years since quitting: 16.4   Smokeless tobacco: Never  Vaping Use   Vaping Use: Never used  Substance and Sexual Activity   Alcohol use: No   Drug use: No   Sexual activity: Never  Other Topics Concern   Not on file  Social  History Narrative   Not on file   Social Determinants of Health   Financial Resource Strain: Not on file  Food Insecurity: Not on file  Transportation Needs: Not on file  Physical Activity: Not on file  Stress: Not on file  Social Connections:  Not on file   Allergies  Allergen Reactions   Infliximab Nausea Only and Shortness Of Breath    Medications   Medications Prior to Admission  Medication Sig Dispense Refill Last Dose   acetaminophen (TYLENOL) 325 MG tablet Take 2 tablets (650 mg total) by mouth every 4 (four) hours as needed for headache or mild pain. 30 tablet 0 10/09/2020   aspirin EC 81 MG tablet Take 1 tablet (81 mg total) by mouth daily. 90 tablet 3 10/09/2020   atorvastatin (LIPITOR) 40 MG tablet Take 40 mg by mouth daily.   10/09/2020   celecoxib (CELEBREX) 200 MG capsule Take by mouth.   10/09/2020   cetirizine (ZYRTEC) 10 MG tablet Take 10 mg by mouth daily as needed for allergies.   unk   docusate sodium (COLACE) 100 MG capsule Take 1 capsule (100 mg total) by mouth 2 (two) times daily. (Patient taking differently: Take 100 mg by mouth daily as needed for mild constipation.) 30 capsule 0 unk   fluticasone (FLONASE) 50 MCG/ACT nasal spray Place 1 spray into the nose daily as needed for allergies.   unk   furosemide (LASIX) 40 MG tablet Take 1 tablet (40 mg total) by mouth daily. 30 tablet 0 10/09/2020   gabapentin (NEURONTIN) 300 MG capsule Take 300 mg by mouth 2 (two) times daily.    10/09/2020   isosorbide mononitrate (IMDUR) 60 MG 24 hr tablet Take 1 tablet (60 mg total) by mouth daily. 30 tablet 0 10/09/2020   leflunomide (ARAVA) 10 MG tablet Take 1 tablet by mouth daily.   10/09/2020   lisinopril (ZESTRIL) 5 MG tablet Take 10 mg by mouth daily.   10/09/2020   metoprolol succinate (TOPROL-XL) 25 MG 24 hr tablet Take 25 mg by mouth daily.   10/09/2020 at 1000   montelukast (SINGULAIR) 10 MG tablet Take 10 mg by mouth at bedtime.   10/08/2020   omeprazole (PRILOSEC) 20 MG  capsule Take 20 mg by mouth daily.    10/09/2020   predniSONE (DELTASONE) 5 MG tablet Take 5 mg by mouth 2 (two) times daily with a meal.   10/09/2020   spironolactone (ALDACTONE) 25 MG tablet Take 25 mg by mouth daily.   10/09/2020   triamcinolone (KENALOG) 0.1 % Apply 1 application topically daily as needed (to flaky skin).   Past Week     Vitals   Vitals:   10/14/20 0509 10/14/20 0511 10/14/20 1027 10/14/20 1649  BP: 111/70 110/69 107/66 109/68  Pulse: 98 78 96 99  Resp: 16 16 15 17   Temp: 98 F (36.7 C) 98.9 F (37.2 C) 98.6 F (37 C)   TempSrc:  Oral Oral   SpO2: 98% 98% 96% 99%  Weight:      Height:         Body mass index is 24.6 kg/m.  Physical Exam   General: Laying comfortably in bed; in no acute distress. HENT: Normal oropharynx and mucosa. Normal external appearance of ears and nose. Neck: Supple, no pain or tenderness CV: No JVD. No peripheral edema. Pulmonary: Symmetric Chest rise. Normal respiratory effort. Abdomen: Soft to touch, non-tender. Ext: No cyanosis, edema, or deformity Skin: No rash. Normal palpation of skin.  Musculoskeletal: Normal digits and nails by inspection. No clubbing.   Neurologic Examination  Mental status/Cognition: awake, unable to assess orientation. Speech/language: moans vs incomprehensible syllables. Cranial nerves:   CN II Pupils equal and reactive to light, ? R hemianopsia.   CN III,IV,VI Suspect some L  gaze preference, crosses midline with R eye but not with left eye.   CN V    CN VII R facial droop   CN VIII Turns head to speech.   CN IX & X    CN XI    CN XII    Motor:  Muscle bulk: normal, tone RUE flaccid tone. Unable to do detailes strength testing due to the acuity of the situation and to minimize time to thrombectomy.  LUE: 5/5 RUE: 1/5 LLE: 4/5 RLE: 1/5  Sensation:  Light touch intact   Pin prick    Temperature    Vibration   Proprioception    Coordination/Complex Motor:  - Finger to Nose intact in  LUE  Labs   CBC:  Recent Labs  Lab 10/09/20 1859 10/10/20 0351 10/12/20 0400 10/14/20 0321  WBC 6.0   < > 10.1 7.0  NEUTROABS 3.6  --   --   --   HGB 8.8*   < > 9.2* 8.5*  HCT 28.9*   < > 29.9* 27.5*  MCV 81.0   < > 78.7* 78.8*  PLT 293   < > 277 284   < > = values in this interval not displayed.    Basic Metabolic Panel:  Lab Results  Component Value Date   NA 137 10/14/2020   K 4.3 10/14/2020   CO2 22 10/14/2020   GLUCOSE 107 (H) 10/14/2020   BUN 10 10/14/2020   CREATININE 0.53 (L) 10/14/2020   CALCIUM 9.1 10/14/2020   GFRNONAA >60 10/14/2020   GFRAA >60 09/21/2019   Lipid Panel:  Lab Results  Component Value Date   LDLCALC 128 (H) 09/27/2020   HgbA1c:  Lab Results  Component Value Date   HGBA1C 5.7 (H) 08/21/2019   Urine Drug Screen: No results found for: LABOPIA, COCAINSCRNUR, LABBENZ, AMPHETMU, THCU, LABBARB  Alcohol Level No results found for: [place]  CT Head without contrast: CTH was negative for a large hypodensity concerning for a large territory infarct or hyperdensity concerning for an ICH  CT angio Head and Neck with contrast: L MCA M2 occlusion vs high grade stenosis.  CT perfusion: 34ml penumbra with no core.  MRI Brain: pending  Impression   Jonathan [name] is a 60 y.o. male initially admitted with left foot ulcer and osteo on vanc and zosyn, noted to have acute R sided weakness and aphasia and transferred for thrombectomy for potential LMCA M2 occlusion vs high grade stenosis. EKG enroute with ST elevation concerning for STEMI. Discussed with cardiology on call, they reviewed his recent cath and thought the the EKG findings likely from known RCA rather than LAD.  Primary Diagnosis:  Cerebral infarction due to embolism of  left middle cerebral artery.   Secondary Diagnosis: Acute systolic (congestive) heart failure and Possible Sepsis  Recommendations   L MCA stroke: - Admit to Neuro ICU post thrombectomy. - Frequent Neuro checks  per stroke unit protocol - Recommend brain imaging with MRI Brain without contrast - Recommend obtaining TTE with bubble study - Recommend obtaining Lipid panel with LDL - Please start statin if LDL > 70 - Recommend HbA1c - Antithrombotic and blood pressure goals per Neuro IR for the first 24 hours after thrombectomy - Recommend DVT ppx - Recommend Telemetry monitoring for arrythmia - Recommend bedside swallow screen prior to PO intake. - Stroke education booklet - Recommend PT/OT/SLP consult   STEMI: Noted on EKG enroute. Discussed with cardiology just prior to thrombectomy. They think this is likely due  to known RCA occlusion noted on recent cath which also demonstrated widely patent LAD. Not ideal but cardiology agrees with [place] that thrombectomy should take precedence. - Cardiology consulted and plan to see him tonight - further recs per cards.  Chronic L foot ulcer with concern for osteomyelitis: - Continue Vanc and Zosyn - PT and OT eval.  Chronic combined CHF:  - fluid restriction,  - hold off on lasix for now - Hold off on ISMO and Metoprolol for now given permissive Hypertension.(Can resume if cardiology would like to)   HTN: - hold off on Antihypertensives. Goal SBP per Neuro IR for first 24 hours after thrombectomy.  Rheumatoid arthritis:  - Continue home Prednisone.  Hyperlipidemia:  Continue Lipitor 40 mg daily   Venous stasis with ulcers:  Continue aspirin   Chronic pain syndrome:  - Continue home gabapentin   Debility/physical deconditioning:  -PT/OT eval   ______________________________________________________________________  This patient is critically ill and at significant risk of neurological worsening, death and care requires constant monitoring of vital signs, hemodynamics,respiratory and cardiac monitoring, neurological assessment, discussion with family, other specialists and medical decision making of high complexity. I spent 90 minutes of  neurocritical care time  in the care of  this patient. This was time spent independent of any time provided by nurse practitioner or PA.  Donnetta Simpers [facility] Pager Number [phone] 10/14/2020  7:38 PM   Thank you for the opportunity to take part in the care of this patient. If you have any further questions, please contact the neurology consultation attending.  Signed,  [place] Pager Number [phone] _ _ _   _ __   _ __ _ _  __ __   _ __   __ _

## 2020-10-14 NOTE — Plan of Care (Signed)

## 2020-10-14 NOTE — Transfer of Care (Signed)
Immediate Anesthesia Transfer of Care Note  Patient: Jonathan [name]  Procedure(s) Performed: IR WITH ANESTHESIA  Patient Location: ICU  Anesthesia Type:General  Level of Consciousness: sedated and Patient remains intubated per anesthesia plan  Airway & Oxygen Therapy: Patient remains intubated per anesthesia plan and Patient placed on Ventilator (see vital sign flow sheet for setting)  Post-op Assessment: Report given to RN and Post -op Vital signs reviewed and stable  Post vital signs: Reviewed and stable  Last Vitals:  Vitals Value Taken Time  BP 95/65 10/14/20 2052  Temp    Pulse 79 10/14/20 2053  Resp 7 10/14/20 2053  SpO2 100 % 10/14/20 2053  Vitals shown include unvalidated device data.  Last Pain:  Vitals:   10/14/20 1505  TempSrc:   PainSc: Asleep      Patients Stated Pain Goal: 1 (03/79/44 4619)  Complications: No notable events documented.

## 2020-10-14 NOTE — Procedures (Signed)
INTERVENTIONAL NEURORADIOLOGY BRIEF POSTPROCEDURE NOTE  DIAGNOSTIC CEREBRAL ANGIOGRAM AND MECHANICAL THROMBECTOMY  Attending: Dr. Erven Colla de Sindy Messing   Assistant: None.  Diagnosis: Left M2/MCA occlusion  Access site: RCFA, 64F  Access closure: Angioseal, 64F  Anesthesia: General anesthesia  Medication used: Refer to anesthesia documentation.  Complications: None.  Estimated blood loss: 22mL.  Specimen: None.  Findings: Occlusion of the left M2/MCA segment. Mechanical thrombectomy performed with combined aspiration and stent retriever with complete recanalization. No hemorrhagic complication on flat panel head CT.  The patient tolerated the procedure well without incident or complication and is in stable condition.

## 2020-10-14 NOTE — Consult Note (Addendum)
NEUROLOGY TELECONSULTATION NOTE   Date of service: October 14, 2020 Patient Name: Jonathan [name] MRN:  [id number] DOB:  09-04-1960 Reason for consult: R sided weakness, LKW 1300?  Requesting Provider: Shawna Clamp, MD Consult Participants: [facility] telestroke RN, bedside RN, patient, myself Location of the provider: [facility] Location of the patient: [facility]  This consult was provided via telemedicine with 2-way video and audio communication. The patient/family was informed that care would be provided in this way and agreed to receive care in this manner.   _ _ _   _ __   _ __ _ _  __ __   _ __   __ _  History of Present Illness   60 yo patient admitted for suspected osteo on whom telestroke was called for acute onset aphasia, R side weakness. Unclear LKW but appears to be around 1300 today. RN noticed when she went to give medicine that he was no longer speaking and R side was flaccid.   NIHSS = 20 w/ global aphasia and R side flaccid. L gaze preference without forced deviation. Does not blink to threat on R. Does not follow any commands but some spontaneous movement on LUE and LLE.tPA not administered 2/2 pt outside window. CTA H&N showed L M2 occlusion with 49cc penumbra. Code IR activated and patient transferred to [facility] for emergent thrombectomy.  ROS   UTA 2/2 aphasia  Past History   Past Medical History:  Diagnosis Date   Anemia    H/o using iron in the past    Arthritis    rheumatoid   Chronic back pain    Hyperlipidemia    Joint pain    Joint swelling    Rheumatoid arthritis(714.0)    Past Surgical History:  Procedure Laterality Date   COLONOSCOPY N/A 04/03/2013   Procedure: COLONOSCOPY;  Surgeon: Irene Shipper, MD;  Location: [facility];  Service: Endoscopy;  Laterality: N/A;   COLONOSCOPY     ENDOVENOUS ABLATION SAPHENOUS VEIN W/ LASER Left 09/26/2019   endovenous laser ablation left greater saphenous vein by Gae Gallop MD    ENDOVENOUS ABLATION  SAPHENOUS VEIN W/ LASER Right 10/31/2019   endovenous laser ablation right greater saphenous vein by Gae Gallop MD    ESOPHAGOGASTRODUODENOSCOPY N/A 04/03/2013   Procedure: ESOPHAGOGASTRODUODENOSCOPY (EGD);  Surgeon: Irene Shipper, MD;  Location: [facility];  Service: Endoscopy;  Laterality: N/A;  changed to moderate dr. Henrene Pastor did not want to go to the o.r. for an endo colon-jmt   EYE SURGERY Left    "when i was a very small boy"   GRAFT APPLICATION Left 07/08/4006   Procedure: GRAFT APPLICATION;  Surgeon: Landis Martins, DPM;  Location: [facility];  Service: Podiatry;  Laterality: Left;  [place] (rep already contacted: Matt Sides)   INCISION AND DRAINAGE OF WOUND Left 10/11/2020   Procedure: IRRIGATION AND DEBRIDEMENT WOUND;  Surgeon: Landis Martins, DPM;  Location: [facility];  Service: Podiatry;  Laterality: Left;   JOINT REPLACEMENT     both knees   KNEE ARTHROPLASTY  06/07/2011   Procedure: COMPUTER ASSISTED TOTAL KNEE ARTHROPLASTY;  Surgeon: Mcarthur Rossetti, MD;  Location: [place];  Service: Orthopedics;  Laterality: Right;  Right total knee arthoplasty   KNEE CLOSED REDUCTION  07/19/2011   Procedure: CLOSED MANIPULATION KNEE;  Surgeon: Mcarthur Rossetti, MD;  Location: [place];  Service: Orthopedics;  Laterality: Right;  Manipulation under anesthesia right knee   LEFT HEART CATH AND CORONARY ANGIOGRAPHY N/A 08/21/2019   Procedure:  LEFT HEART CATH AND CORONARY ANGIOGRAPHY;  Surgeon: Belva Crome, MD;  Location: [place] [facility];  Service: Cardiovascular;  Laterality: N/A;   METATARSAL HEAD EXCISION Left 10/11/2020   Procedure: METATARSAL HEAD EXCISION;  Surgeon: Landis Martins, DPM;  Location: [facility];  Service: Podiatry;  Laterality: Left;  4th metatarsal   RIGHT/LEFT HEART CATH AND CORONARY ANGIOGRAPHY N/A 09/28/2020   Procedure: RIGHT/LEFT HEART CATH AND CORONARY ANGIOGRAPHY;  Surgeon: Nelva Bush, MD;  Location: [place] [facility];  Service: Cardiovascular;  Laterality: N/A;    TOTAL HIP ARTHROPLASTY Right 11/11/2014   Procedure: RIGHT TOTAL HIP ARTHROPLASTY ANTERIOR APPROACH;  Surgeon: Mcarthur Rossetti, MD;  Location: [place];  Service: Orthopedics;  Laterality: Right;   TOTAL HIP ARTHROPLASTY Left 01/29/2015   Procedure: LEFT TOTAL HIP ARTHROPLASTY ANTERIOR APPROACH;  Surgeon: Mcarthur Rossetti, MD;  Location: [place];  Service: Orthopedics;  Laterality: Left;   Family History  Problem Relation Age of Onset   Cancer Mother        ?   Hypertension Mother    Heart failure Father        heart transplant   Cancer Brother    Anesthesia problems Neg Hx    Colon cancer Neg Hx        mother had "3" types of CA- unsure about which one   Esophageal cancer Neg Hx    Rectal cancer Neg Hx    Stomach cancer Neg Hx    Social History   Socioeconomic History   Marital status: Single    Spouse name: Not on file   Number of children: Not on file   Years of education: Not on file   Highest education level: Not on file  Occupational History   Occupation: Disabled  Tobacco Use   Smoking status: Former    Pack years: 0.00    Types: Cigarettes    Quit date: 04/25/2004    Years since quitting: 16.4   Smokeless tobacco: Never  Vaping Use   Vaping Use: Never used  Substance and Sexual Activity   Alcohol use: No   Drug use: No   Sexual activity: Never  Other Topics Concern   Not on file  Social History Narrative   Not on file   Social Determinants of Health   Financial Resource Strain: Not on file  Food Insecurity: Not on file  Transportation Needs: Not on file  Physical Activity: Not on file  Stress: Not on file  Social Connections: Not on file   Allergies  Allergen Reactions   Infliximab Nausea Only and Shortness Of Breath    Medications   Medications Prior to Admission  Medication Sig Dispense Refill Last Dose   acetaminophen (TYLENOL) 325 MG tablet Take 2 tablets (650 mg total) by mouth every 4 (four) hours as needed for headache or mild pain.  30 tablet 0 10/09/2020   aspirin EC 81 MG tablet Take 1 tablet (81 mg total) by mouth daily. 90 tablet 3 10/09/2020   atorvastatin (LIPITOR) 40 MG tablet Take 40 mg by mouth daily.   10/09/2020   celecoxib (CELEBREX) 200 MG capsule Take by mouth.   10/09/2020   cetirizine (ZYRTEC) 10 MG tablet Take 10 mg by mouth daily as needed for allergies.   unk   docusate sodium (COLACE) 100 MG capsule Take 1 capsule (100 mg total) by mouth 2 (two) times daily. (Patient taking differently: Take 100 mg by mouth daily as needed for mild constipation.) 30 capsule  0 unk   fluticasone (FLONASE) 50 MCG/ACT nasal spray Place 1 spray into the nose daily as needed for allergies.   unk   furosemide (LASIX) 40 MG tablet Take 1 tablet (40 mg total) by mouth daily. 30 tablet 0 10/09/2020   gabapentin (NEURONTIN) 300 MG capsule Take 300 mg by mouth 2 (two) times daily.    10/09/2020   isosorbide mononitrate (IMDUR) 60 MG 24 hr tablet Take 1 tablet (60 mg total) by mouth daily. 30 tablet 0 10/09/2020   leflunomide (ARAVA) 10 MG tablet Take 1 tablet by mouth daily.   10/09/2020   lisinopril (ZESTRIL) 5 MG tablet Take 10 mg by mouth daily.   10/09/2020   metoprolol succinate (TOPROL-XL) 25 MG 24 hr tablet Take 25 mg by mouth daily.   10/09/2020 at 1000   montelukast (SINGULAIR) 10 MG tablet Take 10 mg by mouth at bedtime.   10/08/2020   omeprazole (PRILOSEC) 20 MG capsule Take 20 mg by mouth daily.    10/09/2020   predniSONE (DELTASONE) 5 MG tablet Take 5 mg by mouth 2 (two) times daily with a meal.   10/09/2020   spironolactone (ALDACTONE) 25 MG tablet Take 25 mg by mouth daily.   10/09/2020   triamcinolone (KENALOG) 0.1 % Apply 1 application topically daily as needed (to flaky skin).   Past Week     Vitals   Vitals:   10/14/20 0509 10/14/20 0511 10/14/20 1027 10/14/20 1649  BP: 111/70 110/69 107/66 109/68  Pulse: 98 78 96 99  Resp: 16 16 15 17   Temp: 98 F (36.7 C) 98.9 F (37.2 C) 98.6 F (37 C)   TempSrc:  Oral Oral    SpO2: 98% 98% 96% 99%  Weight:      Height:         Body mass index is 24.6 kg/m.  Physical Exam   Exam performed over telemedicine with 2-way video and audio communication and with assistance of bedside RN  Physical Exam Gen: alert, unable to follow simple commands Resp: normal WOB CV: extremities appear well-perfused  Neuro: *MS: alert, unable to follow simple commands *Speech: global aphasia *CN: PERRL, L gaze preference but will cross midline, does not blink to threat on L, R facial palsy, hearing intact to voice *Motor:   R side flaccid. Unable to follow commands but some spontaneous movement anti-gravity LUE and LLE.  *Sensory: SILT.   NIHSS  1a Level of Conscious.: 0 1b LOC Questions: 2 1c LOC Commands: 2 2 Best Gaze: 0 3 Visual: 1 4 Facial Palsy: 2 5a Motor Arm - left: 1 5b Motor Arm - Right: 4 6a Motor Leg - Left: 1 6b Motor Leg - Right: 4 7 Limb Ataxia: 0 8 Sensory: 0 9 Best Language: 3 10 Dysarthria: 0 11 Extinct. and Inatten.: 0  TOTAL: 20   Premorbid mRS = 4   Labs   CBC:  Recent Labs  Lab 10/09/20 1859 10/10/20 0351 10/12/20 0400 10/14/20 0321  WBC 6.0   < > 10.1 7.0  NEUTROABS 3.6  --   --   --   HGB 8.8*   < > 9.2* 8.5*  HCT 28.9*   < > 29.9* 27.5*  MCV 81.0   < > 78.7* 78.8*  PLT 293   < > 277 284   < > = values in this interval not displayed.    Basic Metabolic Panel:  Lab Results  Component Value Date   NA 137 10/14/2020   K  4.3 10/14/2020   CO2 22 10/14/2020   GLUCOSE 107 (H) 10/14/2020   BUN 10 10/14/2020   CREATININE 0.53 (L) 10/14/2020   CALCIUM 9.1 10/14/2020   GFRNONAA >60 10/14/2020   GFRAA >60 09/21/2019   Lipid Panel:  Lab Results  Component Value Date   LDLCALC 128 (H) 09/27/2020   HgbA1c:  Lab Results  Component Value Date   HGBA1C 5.7 (H) 08/21/2019   Urine Drug Screen: No results found for: LABOPIA, COCAINSCRNUR, LABBENZ, AMPHETMU, THCU, LABBARB  Alcohol Level No results found for:  ETH    Impression   60 yo man inpatient at [facility] code stroke called for R side flaccid with global aphasia. 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 Code IR activated and patient transferred to [facility] for consideration of emergent thrombectomy. mRS = 4 2/2 cognition (autism, baseline). Per Dr. Ladean Raya with neuro IR, imaging favorable so she will still consider offering intervention. Decision maker Marylen Ponto cousin is aware of situation but has not yet been consented for thrombectomy.  Recommendations   - Code IR transfer to [facility] for consideration of thrombectomy. Signed out to neurology and neuro IR at [facility]. ______________________________________________________________________   Thank you for the opportunity to take part in the care of this patient. If you have any further questions, please contact the neurology consultation attending.  Signed,  Su Monks, MD [facility] [phone]  If 7pm- 7am, please page neurology on call as listed in [place].

## 2020-10-14 NOTE — Progress Notes (Signed)
Noted recommendations by Dr. Ellyn Hack. He has EKG changes which could be due to cardiac pathology, though not definite. Recommendation is for heparin. Though there is some risk of hemorrhagic transformation, with negative post-op CT, I think that a low dose protocol with no drip would be reasonable. Will repeat CT in the morning.   Roland Rack, MD [facility] [phone]  If 7pm- 7am, please page neurology on call as listed in [place].

## 2020-10-14 NOTE — Anesthesia Procedure Notes (Signed)
Procedure Name: Intubation Date/Time: 10/14/2020 7:22 PM Performed by: Clovis Cao, CRNA Pre-anesthesia Checklist: Patient identified, Emergency Drugs available, Suction available and Patient being monitored Patient Re-evaluated:Patient Re-evaluated prior to induction Oxygen Delivery Method: Circle system utilized Preoxygenation: Pre-oxygenation with 100% oxygen Induction Type: IV induction, Rapid sequence and Cricoid Pressure applied Laryngoscope Size: Miller and 2 Grade View: Grade I Tube type: Oral Tube size: 8.0 mm Number of attempts: 1 Airway Equipment and Method: Stylet Placement Confirmation: ETT inserted through vocal cords under direct vision, positive ETCO2 and breath sounds checked- equal and bilateral Secured at: 22 cm Tube secured with: Tape Dental Injury: Teeth and Oropharynx as per pre-operative assessment

## 2020-10-14 NOTE — Progress Notes (Signed)
Physical Therapy Treatment Patient Details Name: Jonathan [name] MRN: [id number] DOB: [birth year] Today's Date: 10/14/2020    History of Present Illness Patient s/p fourth metatarsal head excision, irrigation and debridement and graft application on 1/02.  60 year old M with PMH of RA, systolic CHF, chronic venous stasis with chronic left foot ulcer and recent diagnosis of onychomycosis s/p debridement by his podiatrist presenting with worsening chronic left foot ulcer, and admitted for infected left foot ulcer and possible osteomyelitis on x-ray and MRI.    PT Comments    Pt requiring more assist today for bed mobility and transfers, unable to transition to crutches to amb (refuses RW). Per previous PT notes pt has an aide 7x/wk--will continue to recommend HHPT although unsure of pt's functional baseline, pt is not able to communicate his PLOF and no family.   If continued assist at home unavailable, may need SNF   Follow Up Recommendations  Home health PT;Supervision for mobility/OOB     Equipment Recommendations  None recommended by PT    Recommendations for Other Services       Precautions / Restrictions Precautions Precautions: Fall Precaution Comments: post op shoe Restrictions Weight Bearing Restrictions: No LLE Weight Bearing: Weight bearing as tolerated    Mobility  Bed Mobility Overal bed mobility: Needs Assistance Bed Mobility: Supine to Sit;Sit to Supine     Supine to sit: Mod assist;Max assist;HOB elevated Sit to supine: Mod assist   General bed mobility comments: assist with scooting laterally in bed and to elevate trunk, incr time, repeated LOB to R. assist to lift bil LEs on to bed (possibly pt sleeps in lift chair at home)    Transfers Overall transfer level: Needs assistance Equipment used: Crutches Transfers: Sit to/from Stand Sit to Stand: Mod assist;Max assist;From elevated surface         General transfer comment: assist with  anterior-superior wt shift. unable to stand and transition to crutches d/t retropulsion  Ambulation/Gait             General Gait Details: unable   Stairs             Wheelchair Mobility    Modified Rankin (Stroke Patients Only)       Balance   Sitting-balance support: Feet supported;Single extremity supported Sitting balance-Leahy Scale: Poor (LOB to R)       Standing balance-Leahy Scale: Zero                              Cognition Arousal/Alertness: Awake/alert Behavior During Therapy: WFL for tasks assessed/performed Overall Cognitive Status: No family/caregiver present to determine baseline cognitive functioning                                        Exercises      General Comments        Pertinent Vitals/Pain Pain Assessment: Faces Faces Pain Scale: No hurt    Home Living                      Prior Function            PT Goals (current goals can now be found in the care plan section) Acute Rehab PT Goals Patient Stated Goal: to go home PT Goal Formulation: With patient Time For Goal Achievement: 10/26/20 Potential to Achieve Goals: Good Progress  towards PT goals: Progressing toward goals    Frequency    Min 4X/week      PT Plan Current plan remains appropriate    Co-evaluation              AM-PAC PT "6 Clicks" Mobility   Outcome Measure  Help needed turning from your back to your side while in a flat bed without using bedrails?: A Little Help needed moving from lying on your back to sitting on the side of a flat bed without using bedrails?: A Lot Help needed moving to and from a bed to a chair (including a wheelchair)?: A Lot Help needed standing up from a chair using your arms (e.g., wheelchair or bedside chair)?: A Lot Help needed to walk in hospital room?: A Lot Help needed climbing 3-5 steps with a railing? : A Lot 6 Click Score: 13    End of Session Equipment Utilized  During Treatment: Gait belt Activity Tolerance: Patient tolerated treatment well Patient left: in bed;with call bell/phone within reach;with bed alarm set Nurse Communication: Mobility status PT Visit Diagnosis: Unsteadiness on feet (R26.81);Other abnormalities of gait and mobility (R26.89)     Time: 0071-2197 PT Time Calculation (min) (ACUTE ONLY): 19 min  Charges:  $Therapeutic Activity: 8-22 mins                     Baxter Flattery, PT  Acute Rehab Dept ([place]) [phone] Pager [phone]  10/14/2020    [facility] 10/14/2020, 11:55 AM

## 2020-10-14 NOTE — Progress Notes (Signed)
PROGRESS NOTE    Neko Boyajian  YDX:[id number] DOB: February 04, 1961 DOA: 10/09/2020 PCP: System, Provider Not In   Brief Narrative:  This 60 year old Male with PMH of RA, systolic CHF, chronic venous stasis with chronic left foot ulcer and recent diagnosis of onychomycosis s/p debridement by his podiatrist presented with worsening chronic left foot ulcer, He is admitted for infected left foot ulcer and possible osteomyelitis on x-ray and MRI.  He was a started on broad-spectrum antibiotics- Zosyn and vancomycin.  Podiatry consulted and he  underwent fourth metatarsal head excision, irrigation and debridement and graft application on 7/20.  Remains on IV vancomycin and Zosyn pending surgical wound cultures.  Assessment & Plan:   Principal Problem:   Acute osteomyelitis of ankle and foot (HCC) Active Problems:   Rheumatoid arthritis involving multiple sites with positive rheumatoid factor (HCC)   Coronary artery disease involving native coronary artery of native heart   Chronic foot ulcer with fat layer exposed, left ([place])   Chronic left foot ulcer / Infection with concern for osteomyelitis:  Patient presented with nonhealing left foot ulcer with infection. X-ray and MRI concerning for osteomyelitis. Patient is immunocompromised.   Denies history of diabetes.  Good DP pulses bilaterally.  ABI slightly elevated with triphasic waveforms. S/p fourth metatarsal head excision, irrigation and debridement and graft application by Dr. Cannon Kettle Continue vancomycin and Zosyn pending surgical cultures Surgical path margins resulted negative , podiatry advised total of 10 days abx. PT and OT evaluation.   Abnormal ABI: Vascular surgery consulted.  No need for any intervention. Continue statins and aspirin.   Chronic combined CHF:  TTE earlier this month with LVEF of 30 to 35%, R WMA, G2-DD.   Appears euvolemic except for his chronic venous insufficiency.  No cardiopulmonary symptoms.  Continue home  Lasix, Aldactone, Imdur, metoprolol and lisinopril -Monitor fluid and respiratory status -Monitor renal functions and electrolytes -Sodium and fluid restriction   Essential hypertension:  Hold blood pressure medication as blood pressure is on the lower side.   Hypokalemia/hypomagnesemia/hypophosphatemia Improved, continue to monitor.   Rheumatoid arthritis:  Deformities in his hands and feet. Continue home Ret[place] and prednisone.   Hyponatremia: Resolved.   Hyperlipidemia:  Continue Lipitor 40 mg daily   Venous stasis with ulcers:  Continue aspirin   Chronic pain syndrome:  On meloxicam and gabapentin.   Continue home gabapentin   Anemia of chronic disease: H&H stable.   Debility/physical deconditioning:  Reports using crutches at baseline. -PT/OT eval    DVT prophylaxis:  Lovenox. Code Status:  Full code. Family Communication: No family at bed side. Disposition Plan:   Status is: Inpatient  Remains inpatient appropriate because:Inpatient level of care appropriate due to severity of illness  Dispo: The patient is from: Home              Anticipated d/c is to: Home              Patient currently is not medically stable to d/c.   Difficult to place patient No Consultants:  Podiatry Vascular surgery  Procedures:   fourth metatarsal head excision, irrigation and debridement and graft application on 9/47.  Antimicrobials:   Anti-infectives (From admission, onward)    Start     Dose/Rate Route Frequency Ordered Stop   10/10/20 2200  vancomycin (VANCOREADY) IVPB 1750 mg/350 mL        1,750 mg 175 mL/hr over 120 Minutes Intravenous Every 24 hours 10/09/20 2227     10/10/20 0400  piperacillin-tazobactam (ZOSYN)  IVPB 3.375 g        3.375 g 12.5 mL/hr over 240 Minutes Intravenous Every 8 hours 10/09/20 2155     10/09/20 2200  vancomycin (VANCOCIN) IVPB 1000 mg/200 mL premix        1,000 mg 200 mL/hr over 60 Minutes Intravenous  Once 10/09/20 2155 10/10/20 0841    10/09/20 2000  piperacillin-tazobactam (ZOSYN) IVPB 3.375 g        3.375 g 100 mL/hr over 30 Minutes Intravenous  Once 10/09/20 1950 10/09/20 2111        Subjective: Patient was seen and examined at bedside.  He reports feeling much improved.   He has slurring of his speech not sure which is his baseline.  He is alert and oriented x2. He denies any pain.  Objective: Vitals:   10/14/20 0454 10/14/20 0509 10/14/20 0511 10/14/20 1027  BP:  111/70 110/69 107/66  Pulse:  98 78 96  Resp:  16 16 15   Temp:  98 F (36.7 C) 98.9 F (37.2 C) 98.6 F (37 C)  TempSrc:   Oral Oral  SpO2:  98% 98% 96%  Weight: 63 kg     Height:        Intake/Output Summary (Last 24 hours) at 10/14/2020 1542 Last data filed at 10/14/2020 1441 Gross per 24 hour  Intake 980 ml  Output 2775 ml  Net -1795 ml   Filed Weights   10/10/20 0700 10/11/20 0500 10/14/20 0454  Weight: 58.5 kg 58 kg 63 kg    Examination:  General exam: Appears calm and comfortable, not in any acute distress, has visible deformities in the hands. Respiratory system: Clear to auscultation. Respiratory effort normal. Cardiovascular system: S1 & S2 heard, RRR. No JVD, murmurs, rubs, gallops or clicks. No pedal edema. Gastrointestinal system: Abdomen is nondistended, soft and nontender. No organomegaly or masses felt. Normal bowel sounds heard. Central nervous system: Alert and oriented. No focal neurological deficits. Extremities: Visible rheumatoid deformities in the hands. Skin: No rashes, lesions or ulcers Psychiatry: Judgement and insight appear normal. Mood & affect appropriate.     Data Reviewed: I have personally reviewed following labs and imaging studies  CBC: Recent Labs  Lab 10/09/20 1859 10/10/20 0351 10/11/20 0409 10/12/20 0400 10/14/20 0321  WBC 6.0 4.7 9.2 10.1 7.0  NEUTROABS 3.6  --   --   --   --   HGB 8.8* 9.1* 8.8* 9.2* 8.5*  HCT 28.9* 29.7* 28.0* 29.9* 27.5*  MCV 81.0 80.7 78.9* 78.7* 78.8*  PLT  293 250 272 277 850   Basic Metabolic Panel: Recent Labs  Lab 10/09/20 1859 10/10/20 0351 10/11/20 0409 10/12/20 0400 10/14/20 0321  NA 141 141 133* 137 137  K 3.7 3.6 3.7 3.6 4.3  CL 109 110 102 106 108  CO2 25 27 25 25 22   GLUCOSE 91 106* 110* 93 107*  BUN 12 10 8 8 10   CREATININE 0.67 0.57* 0.68  0.55* 0.61 0.53*  CALCIUM 9.8 9.5 9.2 9.1 9.1  MG  --   --  1.7 1.6* 1.8  PHOS  --   --  2.4* 1.9*  --    GFR: Estimated Creatinine Clearance: 80 mL/min (A) (by C-G formula based on SCr of 0.53 mg/dL (L)). Liver Function Tests: Recent Labs  Lab 10/10/20 0351 10/11/20 0409 10/12/20 0400  AST 19  --   --   ALT 11  --   --   ALKPHOS 80  --   --  BILITOT 0.4  --   --   PROT 6.5  --   --   ALBUMIN 2.9* 2.9* 2.5*   No results for input(s): LIPASE, AMYLASE in the last 168 hours. No results for input(s): AMMONIA in the last 168 hours. Coagulation Profile: No results for input(s): INR, PROTIME in the last 168 hours. Cardiac Enzymes: No results for input(s): CKTOTAL, CKMB, CKMBINDEX, TROPONINI in the last 168 hours. BNP (last 3 results) No results for input(s): PROBNP in the last 8760 hours. HbA1C: No results for input(s): HGBA1C in the last 72 hours. CBG: No results for input(s): GLUCAP in the last 168 hours. Lipid Profile: No results for input(s): CHOL, HDL, LDLCALC, TRIG, CHOLHDL, LDLDIRECT in the last 72 hours. Thyroid Function Tests: No results for input(s): TSH, T4TOTAL, FREET4, T3FREE, THYROIDAB in the last 72 hours. Anemia Panel: No results for input(s): VITAMINB12, FOLATE, FERRITIN, TIBC, IRON, RETICCTPCT in the last 72 hours. Sepsis Labs: No results for input(s): PROCALCITON, LATICACIDVEN in the last 168 hours.  Recent Results (from the past 240 hour(s))  SARS CORONAVIRUS 2 (TAT 6-24 HRS) Nasopharyngeal Nasopharyngeal Swab     Status: None   Collection Time: 10/09/20  9:57 PM   Specimen: Nasopharyngeal Swab  Result Value Ref Range Status   SARS Coronavirus 2  NEGATIVE NEGATIVE Final    Comment: (NOTE) SARS-CoV-2 target nucleic acids are NOT DETECTED.  The SARS-CoV-2 RNA is generally detectable in upper and lower respiratory specimens during the acute phase of infection. Negative results do not preclude SARS-CoV-2 infection, do not rule out co-infections with other pathogens, and should not be used as the sole basis for treatment or other patient management decisions. Negative results must be combined with clinical observations, patient history, and epidemiological information. The expected result is Negative.  Fact Sheet for Patients: SugarRoll.be  Fact Sheet for Healthcare Providers: https://www.woods-mathews.com/  This test is not yet approved or cleared by the [place] FDA and  has been authorized for detection and/or diagnosis of SARS-CoV-2 by FDA under an Emergency Use Authorization (EUA). This EUA will remain  in effect (meaning this test can be used) for the duration of the COVID-19 declaration under Se ction 564(b)(1) of the Act, 21 U.S.C. section 360bbb-3(b)(1), unless the authorization is terminated or revoked sooner.  Performed at [facility], [place] [address] [place], [place] [address]   Surgical pcr screen     Status: None   Collection Time: 10/10/20  3:34 PM   Specimen: Nasal Mucosa; Nasal Swab  Result Value Ref Range Status   MRSA, PCR NEGATIVE NEGATIVE Final   Staphylococcus aureus NEGATIVE NEGATIVE Final    Comment: (NOTE) The Xpert SA Assay (FDA approved for NASAL specimens in patients 84 years of age and older), is one component of a comprehensive surveillance program. It is not intended to diagnose infection nor to guide or monitor treatment. Performed at [facility], [place] [address] [place], [place] [address]   Aerobic/Anaerobic Culture w Gram Stain (surgical/deep wound)     Status: None (Preliminary result)   Collection Time: 10/11/20  11:06 AM   Specimen: Wound  Result Value Ref Range Status   Specimen Description   Final    WOUND LEFT FOOT Performed at [place] [address], [place], [place] [address]    Special Requests   Final    PATIENT ON FOLLOWING Cephas Darby Performed at [facility] [place] [address], [place], [place] [address]    Gram  Stain   Final    NO WBC SEEN RARE GRAM POSITIVE COCCI Performed at [facility], [place] [address], [place], [place] [address]    Culture   Final    RARE PROTEUS MIRABILIS RARE STAPHYLOCOCCUS COHNII NO ANAEROBES ISOLATED; CULTURE IN PROGRESS FOR 5 DAYS    Report Status PENDING  Incomplete   Organism ID, Bacteria PROTEUS MIRABILIS  Final   Organism ID, Bacteria STAPHYLOCOCCUS COHNII  Final      Susceptibility   Proteus mirabilis - MIC*    AMPICILLIN <=2 SENSITIVE Sensitive     CEFAZOLIN <=4 SENSITIVE Sensitive     CEFEPIME <=0.12 SENSITIVE Sensitive     CEFTAZIDIME <=1 SENSITIVE Sensitive     CEFTRIAXONE <=0.25 SENSITIVE Sensitive     CIPROFLOXACIN <=0.25 SENSITIVE Sensitive     GENTAMICIN <=1 SENSITIVE Sensitive     IMIPENEM 2 SENSITIVE Sensitive     TRIMETH/SULFA <=20 SENSITIVE Sensitive     AMPICILLIN/SULBACTAM <=2 SENSITIVE Sensitive     PIP/TAZO <=4 SENSITIVE Sensitive     * RARE PROTEUS MIRABILIS   Staphylococcus cohnii - MIC*    CIPROFLOXACIN <=0.5 SENSITIVE Sensitive     ERYTHROMYCIN 0.5 SENSITIVE Sensitive     GENTAMICIN <=0.5 SENSITIVE Sensitive     OXACILLIN 0.5 RESISTANT Resistant     TETRACYCLINE <=1 SENSITIVE Sensitive     VANCOMYCIN 1 SENSITIVE Sensitive     TRIMETH/SULFA <=10 SENSITIVE Sensitive     CLINDAMYCIN <=0.25 SENSITIVE Sensitive     RIFAMPIN <=0.5 SENSITIVE Sensitive     Inducible Clindamycin NEGATIVE Sensitive     * RARE STAPHYLOCOCCUS COHNII    Radiology Studies: No results found.  Scheduled Meds:  aspirin EC  81 mg Oral Daily   atorvastatin  40 mg Oral Daily   docusate sodium  100  mg Oral BID   enoxaparin (LOVENOX) injection  40 mg Subcutaneous Q24H   gabapentin  300 mg Oral BID   isosorbide mononitrate  60 mg Oral Daily   metoprolol succinate  50 mg Oral Daily   montelukast  10 mg Oral QHS   predniSONE  5 mg Oral BID WC   Continuous Infusions:  piperacillin-tazobactam (ZOSYN)  IV 3.375 g (10/14/20 1250)   vancomycin Stopped (10/14/20 0232)     LOS: 5 days    Time spent: 35 mins    Gaines Cartmell, MD [facility]   If 7PM-7AM, please contact night-coverage pr

## 2020-10-14 NOTE — Progress Notes (Signed)
ANTICOAGULATION CONSULT NOTE - Initial Consult  Pharmacy Consult for Heparin Indication: stroke  Allergies  Allergen Reactions   Infliximab Nausea Only and Shortness Of Breath    Patient Measurements: Height: 5\' 3"  (160 cm) Weight: 63 kg (138 lb 14.2 oz) IBW/kg (Calculated) : 56.9  Vital Signs: Temp: 98.6 F (37 C) (06/22 1027) Temp Source: Oral (06/22 1027) BP: 109/68 (06/22 1649) Pulse Rate: 99 (06/22 1649)  Labs: Recent Labs    10/12/20 0400 10/14/20 0321  HGB 9.2* 8.5*  HCT 29.9* 27.5*  PLT 277 284  CREATININE 0.61 0.53*    Estimated Creatinine Clearance: 80 mL/min (A) (by C-G formula based on SCr of 0.53 mg/dL (L)).   Medical History: Past Medical History:  Diagnosis Date   Anemia    H/o using iron in the past    Arthritis    rheumatoid   Chronic back pain    Chronic HFrEF (heart failure with reduced ejection fraction) ([place]) 08/2019   Admitted 6 4-7/20/2022 with CHF   Coronary artery disease, occlusive 08/01/2019   RCA CTO with left-to-right collaterals.  Widely patent LCA-large LAD was several small diagonal branches and small diffusely diseased LCx with multiple OM branches.   Hyperlipidemia    Ischemic cardiomyopathy 09/02/2019   Initial EF was 40 to 45%, as of June 2022 EF now 30 to 35%.  Known RCA occlusion.  There is inferior and inferoseptal akinesis on echo.   Joint pain    Joint swelling    Rheumatoid arthritis(714.0)     Assessment: 60 y.o. male s/p LMCA stroke and STEMI. Pharmacy consulted to start heparin per stroke protocol.  Goal of Therapy:  Heparin level 0.3 - 0.5 units/ml Monitor platelets by anticoagulation protocol: Yes   Plan:  Start heparin infusion at 800 units/hr Check anti-Xa level in 6 hours and daily while on heparin Continue to monitor H&H and platelets  Alanda Slim, PharmD, [facility] Clinical Pharmacist Please see AMION for all Pharmacists' Contact Phone Numbers 10/14/2020, 9:46 PM

## 2020-10-14 NOTE — Progress Notes (Signed)
RN came to round on Pt and to check VS, noted R arm to be flaccid, R facial droop, unable to state his name, DOB, no grips on bilateral hands, eyes unable to focus and unable to follow command.  Rapid RN called at 16:55, MD notified, CT head ordered per rapid response RN and MD, Pt taken to CT at 17:05.   Report given to [facility], Pt transported to [facility], family notified by neuro MD. All belongings labeled and on unit for family retrieval.

## 2020-10-14 NOTE — Significant Event (Signed)
Rapid Response Event Note   Reason for Call : acute mental status change, right-sided weakness   Initial Focused Assessment: upon assessment, patient is nonverbal, change from baseline. He is able to squeeze on his left hand, and has no pronator drift on the left side. The right arm is completely flaccid. There is a left gaze deviation. Blood pressure 108/62, pulse SR in the 80's, RR 24 with shallow breathing. Patient is sweaty, warm to the touch. Blood sugar 115. Initially 93% on RA, steadily desat down to 88%. 2L [place] added. Up to 97% on 2L.        Interventions: 18g IV started in RUA for CT. CT of head w/ contrast & perfusion scan completed. Patient transferred to [facility] neuro IR for neurologic treatment via [facility]   [facility]     Event Summary:   MD Notified: Dwyane Dee @ 1800; Dr. Quinn Axe via teleneuro @ 1730 Call Time: [place] Arrival Time: 7618 End Time: [place]  Clarene Critchley, RN

## 2020-10-14 NOTE — Anesthesia Procedure Notes (Signed)
Arterial Line Insertion Start/End6/22/2022 6:50 PM, 10/14/2020 7:23 PM Performed by: Clovis Cao, CRNA, CRNA  Patient location: Pre-op. Preanesthetic checklist: patient identified, IV checked, risks and benefits discussed, monitors and equipment checked, pre-op evaluation, timeout performed and anesthesia consent Emergency situation Lidocaine 1% used for infiltration Left, radial was placed Catheter size: 20 Fr Hand hygiene performed  and maximum sterile barriers used   Attempts: 1 Procedure performed without using ultrasound guided technique. Ultrasound Notes:anatomy identified, needle tip was noted to be adjacent to the nerve/plexus identified and no ultrasound evidence of intravascular and/or intraneural injection Following insertion, dressing applied and Biopatch. Post procedure assessment: normal and unchanged

## 2020-10-14 NOTE — Code Documentation (Addendum)
Patient arrived at [facility] from [facility] where he had been admitted since 10/09/20 for osteomyelitis and a chronic foot ulcer. Pt was LKW at 1300 and when RN rounded on him  around 1700 she found pt with right sided hemiparesis, left gaze, globally aphasic, and not following any commands. He was transferred for IR with a left M1 occlusion per MD. Consent was achieved over the phone with patient's cousin and signed by Neurologist. When patient arrived to [facility] the stroke team and charge ED RN were notified that patient's EKG was abnormal and showing a possible STEMI. An EDP was notified and came to evaluate the patient before going to IR suite. This RN completed a new EKG which again confirmed a STEMI.  After several discussions amongst physicians it was decided to proceed with left M1 thrombectomy. Pt was taken into suite and prepped for anesthesia. NIHSS before intubation was 27. See documentation for details. TPA not given r/t outside the window. Hand off with IR RN.   Gunnison Chahal, Rande Brunt, RN  Stroke Response Nurse

## 2020-10-15 ENCOUNTER — Encounter (HOSPITAL_COMMUNITY): Payer: Self-pay | Admitting: Radiology

## 2020-10-15 ENCOUNTER — Inpatient Hospital Stay (HOSPITAL_COMMUNITY): Payer: Medicare Other

## 2020-10-15 DIAGNOSIS — Z978 Presence of other specified devices: Secondary | ICD-10-CM

## 2020-10-15 DIAGNOSIS — I255 Ischemic cardiomyopathy: Secondary | ICD-10-CM

## 2020-10-15 DIAGNOSIS — R9431 Abnormal electrocardiogram [ECG] [EKG]: Secondary | ICD-10-CM

## 2020-10-15 DIAGNOSIS — I63512 Cerebral infarction due to unspecified occlusion or stenosis of left middle cerebral artery: Secondary | ICD-10-CM

## 2020-10-15 DIAGNOSIS — I6389 Other cerebral infarction: Secondary | ICD-10-CM

## 2020-10-15 DIAGNOSIS — Z9911 Dependence on respirator [ventilator] status: Secondary | ICD-10-CM

## 2020-10-15 DIAGNOSIS — J9601 Acute respiratory failure with hypoxia: Secondary | ICD-10-CM

## 2020-10-15 DIAGNOSIS — I2510 Atherosclerotic heart disease of native coronary artery without angina pectoris: Secondary | ICD-10-CM

## 2020-10-15 DIAGNOSIS — E7800 Pure hypercholesterolemia, unspecified: Secondary | ICD-10-CM

## 2020-10-15 LAB — LIPID PANEL
Cholesterol: 160 mg/dL (ref 0–200)
HDL: 38 mg/dL — ABNORMAL LOW (ref >40)
LDL Cholesterol: 101 mg/dL — ABNORMAL HIGH (ref 0–99)
Total CHOL/HDL Ratio: 4.2 RATIO
Triglycerides: 103 mg/dL (ref <150)
VLDL: 21 mg/dL (ref 0–40)

## 2020-10-15 LAB — CBC
HCT: 29.4 % — ABNORMAL LOW (ref 39.0–52.0)
Hemoglobin: 9.1 g/dL — ABNORMAL LOW (ref 13.0–17.0)
MCH: 24.5 pg — ABNORMAL LOW (ref 26.0–34.0)
MCHC: 31 g/dL (ref 30.0–36.0)
MCV: 79 fL — ABNORMAL LOW (ref 80.0–100.0)
Platelets: 312 10*3/uL (ref 150–400)
RBC: 3.72 MIL/uL — ABNORMAL LOW (ref 4.22–5.81)
RDW: 16.1 % — ABNORMAL HIGH (ref 11.5–15.5)
WBC: 6.1 10*3/uL (ref 4.0–10.5)
nRBC: 0 % (ref 0.0–0.2)

## 2020-10-15 LAB — URINALYSIS, COMPLETE (UACMP) WITH MICROSCOPIC
Bacteria, UA: NONE SEEN
Bilirubin Urine: NEGATIVE
Glucose, UA: NEGATIVE mg/dL
Hgb urine dipstick: NEGATIVE
Ketones, ur: NEGATIVE mg/dL
Leukocytes,Ua: NEGATIVE
Nitrite: NEGATIVE
Protein, ur: NEGATIVE mg/dL
Specific Gravity, Urine: >1.046 — ABNORMAL HIGH (ref 1.005–1.030)
pH: 5 (ref 5.0–8.0)

## 2020-10-15 LAB — TROPONIN I (HIGH SENSITIVITY)
Troponin I (High Sensitivity): 25 ng/L — ABNORMAL HIGH (ref <18)
Troponin I (High Sensitivity): 25 ng/L — ABNORMAL HIGH (ref <18)
Troponin I (High Sensitivity): 25 ng/L — ABNORMAL HIGH (ref <18)
Troponin I (High Sensitivity): 25 ng/L — ABNORMAL HIGH (ref <18)

## 2020-10-15 LAB — COMPREHENSIVE METABOLIC PANEL
ALT: 21 U/L (ref 0–44)
AST: 30 U/L (ref 15–41)
Albumin: 2.4 g/dL — ABNORMAL LOW (ref 3.5–5.0)
Alkaline Phosphatase: 69 U/L (ref 38–126)
Anion gap: 10 (ref 5–15)
BUN: 7 mg/dL (ref 6–20)
CO2: 20 mmol/L — ABNORMAL LOW (ref 22–32)
Calcium: 9.2 mg/dL (ref 8.9–10.3)
Chloride: 109 mmol/L (ref 98–111)
Creatinine, Ser: 0.62 mg/dL (ref 0.61–1.24)
GFR, Estimated: >60 mL/min (ref >60)
Glucose, Bld: 88 mg/dL (ref 70–99)
Potassium: 3.8 mmol/L (ref 3.5–5.1)
Sodium: 139 mmol/L (ref 135–145)
Total Bilirubin: 0.8 mg/dL (ref 0.3–1.2)
Total Protein: 6.1 g/dL — ABNORMAL LOW (ref 6.5–8.1)

## 2020-10-15 LAB — BASIC METABOLIC PANEL
Anion gap: 17 — ABNORMAL HIGH (ref 5–15)
BUN: 5 mg/dL — ABNORMAL LOW (ref 6–20)
CO2: 13 mmol/L — ABNORMAL LOW (ref 22–32)
Calcium: 9.2 mg/dL (ref 8.9–10.3)
Chloride: 103 mmol/L (ref 98–111)
Creatinine, Ser: 0.67 mg/dL (ref 0.61–1.24)
GFR, Estimated: >60 mL/min (ref >60)
Glucose, Bld: 65 mg/dL — ABNORMAL LOW (ref 70–99)
Potassium: 4.6 mmol/L (ref 3.5–5.1)
Sodium: 133 mmol/L — ABNORMAL LOW (ref 135–145)

## 2020-10-15 LAB — ECHOCARDIOGRAM LIMITED
Area-P 1/2: 4.6 cm2
Height: 63 in
P 1/2 time: 260 msec
S' Lateral: 5 cm
Weight: 2222.24 oz

## 2020-10-15 LAB — MRSA NEXT GEN BY PCR, NASAL: MRSA by PCR Next Gen: NOT DETECTED

## 2020-10-15 LAB — HEPARIN LEVEL (UNFRACTIONATED): Heparin Unfractionated: 0.1 IU/mL — ABNORMAL LOW (ref 0.30–0.70)

## 2020-10-15 LAB — HEMOGLOBIN A1C
Hgb A1c MFr Bld: 5.5 % (ref 4.8–5.6)
Mean Plasma Glucose: 111.15 mg/dL

## 2020-10-15 LAB — MAGNESIUM: Magnesium: 1.8 mg/dL (ref 1.7–2.4)

## 2020-10-15 LAB — RAPID URINE DRUG SCREEN, HOSP PERFORMED
Amphetamines: NOT DETECTED
Barbiturates: NOT DETECTED
Benzodiazepines: NOT DETECTED
Cocaine: NOT DETECTED
Opiates: POSITIVE — AB
Tetrahydrocannabinol: NOT DETECTED

## 2020-10-15 LAB — TRIGLYCERIDES: Triglycerides: 105 mg/dL (ref <150)

## 2020-10-15 MED ORDER — ATORVASTATIN CALCIUM 40 MG PO TABS
40.0000 mg | ORAL_TABLET | Freq: Every day | ORAL | Status: DC
  Administered 2020-10-16: 40 mg via ORAL
  Filled 2020-10-15: qty 1

## 2020-10-15 MED ORDER — PREDNISONE 5 MG PO TABS
5.0000 mg | ORAL_TABLET | Freq: Two times a day (BID) | ORAL | Status: DC
  Administered 2020-10-15 – 2020-10-19 (×8): 5 mg via ORAL
  Filled 2020-10-15 (×9): qty 1

## 2020-10-15 MED ORDER — FENTANYL CITRATE (PF) 100 MCG/2ML IJ SOLN: 50.0000 ug | INTRAMUSCULAR | Status: DC | PRN

## 2020-10-15 MED ORDER — ATORVASTATIN CALCIUM 80 MG PO TABS: 80.0000 mg | ORAL_TABLET | Freq: Every day | ORAL | Status: DC

## 2020-10-15 MED ORDER — ASPIRIN 81 MG PO CHEW: 81.0000 mg | CHEWABLE_TABLET | Freq: Once | ORAL | Status: DC

## 2020-10-15 MED ORDER — ASPIRIN 81 MG PO CHEW
81.0000 mg | CHEWABLE_TABLET | Freq: Every day | ORAL | Status: DC
  Administered 2020-10-16 – 2020-10-19 (×4): 81 mg via ORAL
  Filled 2020-10-15 (×4): qty 1

## 2020-10-15 MED ORDER — CLOPIDOGREL BISULFATE 75 MG PO TABS
75.0000 mg | ORAL_TABLET | Freq: Every day | ORAL | Status: DC
  Administered 2020-10-16 – 2020-10-19 (×4): 75 mg via ORAL
  Filled 2020-10-15 (×4): qty 1

## 2020-10-15 MED ORDER — MAGNESIUM SULFATE 2 GM/50ML IV SOLN
2.0000 g | Freq: Once | INTRAVENOUS | Status: AC
  Administered 2020-10-15: 2 g via INTRAVENOUS
  Filled 2020-10-15: qty 50

## 2020-10-15 MED ORDER — GABAPENTIN 300 MG PO CAPS
300.0000 mg | ORAL_CAPSULE | Freq: Two times a day (BID) | ORAL | Status: DC
  Administered 2020-10-15 – 2020-10-19 (×8): 300 mg via ORAL
  Filled 2020-10-15 (×8): qty 1

## 2020-10-15 MED ORDER — ATORVASTATIN CALCIUM 40 MG PO TABS
40.0000 mg | ORAL_TABLET | Freq: Every day | ORAL | Status: DC
  Administered 2020-10-15: 40 mg
  Filled 2020-10-15: qty 1

## 2020-10-15 MED ORDER — ASPIRIN 81 MG PO CHEW
81.0000 mg | CHEWABLE_TABLET | Freq: Every day | ORAL | Status: DC
  Administered 2020-10-15: 81 mg
  Filled 2020-10-15: qty 1

## 2020-10-15 MED ORDER — POTASSIUM CHLORIDE 10 MEQ/100ML IV SOLN
10.0000 meq | INTRAVENOUS | Status: AC
Start: 2020-10-15 — End: 2020-10-15
  Administered 2020-10-15 (×2): 10 meq via INTRAVENOUS
  Filled 2020-10-15 (×2): qty 100

## 2020-10-15 MED ORDER — PANTOPRAZOLE SODIUM 40 MG IV SOLR
40.0000 mg | INTRAVENOUS | Status: DC
  Administered 2020-10-15 – 2020-10-16 (×2): 40 mg via INTRAVENOUS
  Filled 2020-10-15 (×2): qty 40

## 2020-10-15 MED ORDER — MONTELUKAST SODIUM 10 MG PO TABS
10.0000 mg | ORAL_TABLET | Freq: Every day | ORAL | Status: DC
  Administered 2020-10-15 – 2020-10-18 (×4): 10 mg via ORAL
  Filled 2020-10-15 (×5): qty 1

## 2020-10-15 MED ORDER — ORAL CARE MOUTH RINSE
15.0000 mL | OROMUCOSAL | Status: DC
  Administered 2020-10-15 (×8): 15 mL via OROMUCOSAL

## 2020-10-15 MED ORDER — CEFAZOLIN SODIUM-DEXTROSE 2-4 GM/100ML-% IV SOLN
2.0000 g | Freq: Three times a day (TID) | INTRAVENOUS | Status: AC
  Administered 2020-10-15 – 2020-10-18 (×11): 2 g via INTRAVENOUS
  Filled 2020-10-15 (×13): qty 100

## 2020-10-15 MED ORDER — ORAL CARE MOUTH RINSE
15.0000 mL | Freq: Two times a day (BID) | OROMUCOSAL | Status: DC
  Administered 2020-10-15 – 2020-10-19 (×8): 15 mL via OROMUCOSAL

## 2020-10-15 MED ORDER — CHLORHEXIDINE GLUCONATE 0.12% ORAL RINSE (MEDLINE KIT)
15.0000 mL | Freq: Two times a day (BID) | OROMUCOSAL | Status: DC
  Administered 2020-10-15 (×2): 15 mL via OROMUCOSAL

## 2020-10-15 MED ORDER — SODIUM CHLORIDE 0.9 % IV SOLN
100.0000 mg | Freq: Two times a day (BID) | INTRAVENOUS | Status: AC
  Administered 2020-10-15 – 2020-10-18 (×7): 100 mg via INTRAVENOUS
  Filled 2020-10-15 (×9): qty 100

## 2020-10-15 MED ORDER — PREDNISONE 5 MG PO TABS
5.0000 mg | ORAL_TABLET | Freq: Two times a day (BID) | ORAL | Status: DC
  Administered 2020-10-15: 5 mg
  Filled 2020-10-15 (×2): qty 1

## 2020-10-15 NOTE — Progress Notes (Signed)
EKG CRITICAL VALUE     12 lead EKG performed.  Critical value noted.  Candace Cruise, RN notified.   Warren Lacy, CCT 10/15/2020 8:35 AM

## 2020-10-15 NOTE — Progress Notes (Signed)
[place] for Heparin Indication: stroke   Labs: Recent Labs    10/14/20 0321 10/14/20 2041 10/14/20 2157 10/14/20 2303 10/15/20 0036 10/15/20 0252 10/15/20 0500 10/15/20 0551  HGB 8.5*  --  8.2*  --   --   --  9.1*  --   HCT 27.5*  --  24.0*  --   --   --  29.4*  --   PLT 284  --   --   --   --   --  312  --   HEPARINUNFRC  --   --   --   --   --   --   --  0.10*  CREATININE 0.53*  --   --   --   --   --   --   --   TROPONINIHS  --    < >  --  25* 25* 25*  --   --    < > = values in this interval not displayed.    Assessment: 60 y.o. male s/p LMCA stroke and STEMI. Pharmacy consulted to start heparin per stroke protocol. Initial heparin level 0.1 units/ml  No bleeding noted.  Heparin was off for about 69min for transport  Goal of Therapy:  Heparin level 0.3 - 0.5 units/ml Monitor platelets by anticoagulation protocol: Yes   Plan:  Increase heparin to 950 units/hr Check heparin level in ~ 6 hours  Thanks for allowing pharmacy to be a part of this patient's care.  Excell Seltzer, PharmD Clinical Pharmacist

## 2020-10-15 NOTE — Progress Notes (Signed)
Progress Note  Patient Name: Jonathan [name] Date of Encounter: 10/15/2020  [facility] Cardiologist: Minus Breeding, MD   Subjective   Patient extubated and on nasal cannula. He still has some right sided deficits included right sided facial droop and aphasia. Patient not very alert. Will briefly wake up when asked a question and then falls back asleep. When asked if he had any chest pain he did shake his head yes. When asked if worse than usual he again shook his head yes. Unable to get any other information from him.  Inpatient Medications    Scheduled Meds:  aspirin  81 mg Per Tube Daily   atorvastatin  40 mg Per Tube Daily   chlorhexidine gluconate (MEDLINE KIT)  15 mL Mouth Rinse BID   Chlorhexidine Gluconate Cloth  6 each Topical Daily   clopidogrel  75 mg Per Tube Daily   gabapentin  300 mg Per Tube BID   mouth rinse  15 mL Mouth Rinse 10 times per day   montelukast  10 mg Per Tube QHS   pantoprazole (PROTONIX) IV  40 mg Intravenous Q24H   predniSONE  5 mg Per Tube BID WC   Continuous Infusions:   ceFAZolin (ANCEF) IV     doxycycline (VIBRAMYCIN) IV     PRN Meds: acetaminophen **OR** acetaminophen (TYLENOL) oral liquid 160 mg/5 mL **OR** acetaminophen, fluticasone, ondansetron **OR** ondansetron (ZOFRAN) IV, senna-docusate   Vital Signs    Vitals:   10/15/20 0732 10/15/20 0800 10/15/20 0900 10/15/20 1000  BP:  (!) 125/56 (!) 125/54 122/65  Pulse:  81  77  Resp: (!) 21   18  Temp:  (!) 97.2 F (36.2 C)    TempSrc:  Axillary    SpO2:    100%  Weight:      Height:        Intake/Output Summary (Last 24 hours) at 10/15/2020 1043 Last data filed at 10/15/2020 1000 Gross per 24 hour  Intake 2095.02 ml  Output 950 ml  Net 1145.02 ml   Last 3 Weights 10/15/2020 10/14/2020 10/11/2020  Weight (lbs) 138 lb 14.2 oz 138 lb 14.2 oz 127 lb 13.9 oz  Weight (kg) 63 kg 63 kg 58 kg      Telemetry    Normal sinus rhythm with rates in the 70s to 90s. Frequent PVCs.  Short runs of NSVT noted (longest being 3 beats) - Personally Reviewed  ECG    Normal sinus rhythm, rate 84 bpm, with continued diffuse ST elevation in inferior and anterolateral leads. Worse in anterolateral leads. - Personally Reviewed  Physical Exam   GEN: Ill appearing African-American male. No acute distress.   Neck: No JVD. Cardiac: RRR. No significant murmurs, rubs, or gallops.  Respiratory: No increased work of breathing. Diffuse rhonchi noted. No crackles appreciated. GI: Soft, non-distended, and non-tender. MS: No to trace lower extremity edema. Left foot wrapped in ACE bandage. Neuro:  Minimally alert. Falls asleep quickly. Persistent right sided facial droops and aphasia.  Labs    High Sensitivity Troponin:   Recent Labs  Lab 10/14/20 2041 10/14/20 2303 10/15/20 0036 10/15/20 0252 10/15/20 0830  TROPONINIHS 26* 25* 25* 25* 25*      Chemistry Recent Labs  Lab 10/10/20 0351 10/11/20 0409 10/12/20 0400 10/14/20 0321 10/14/20 2157 10/15/20 0500  NA 141 133* 137 137 139 139  K 3.6 3.7 3.6 4.3 3.8 3.8  CL 110 102 106 108  --  109  CO2 _0 --  20*  GLUCOSE 106* 110* 93 107*  --  88  BUN _0 --  7  CREATININE 0.57* 0.68  0.55* 0.61 0.53*  --  0.62  CALCIUM 9.5 9.2 9.1 9.1  --  9.2  PROT 6.5  --   --   --   --  6.1*  ALBUMIN 2.9* 2.9* 2.5*  --   --  2.4*  AST 19  --   --   --   --  30  ALT 11  --   --   --   --  21  ALKPHOS 80  --   --   --   --  69  BILITOT 0.4  --   --   --   --  0.8  GFRNONAA >60 >60  >60 >60 >60  --  >60  ANIONGAP 4* _1 --  10     Hematology Recent Labs  Lab 10/12/20 0400 10/14/20 0321 10/14/20 2157 10/15/20 0500  WBC 10.1 7.0  --  6.1  RBC 3.80* 3.49*  --  3.72*  HGB 9.2* 8.5* 8.2* 9.1*  HCT 29.9* 27.5* 24.0* 29.4*  MCV 78.7* 78.8*  --  79.0*  MCH 24.2* 24.4*  --  24.5*  MCHC 30.8 30.9  --  31.0  RDW 15.9* 16.0*  --  16.1*  PLT 277 284  --  312    BNPNo results for input(s): BNP, PROBNP in the  last 168 hours.   DDimer No results for input(s): DDIMER in the last 168 hours.   Radiology    DG Abd 1 View  Result Date: 10/14/2020 CLINICAL DATA:  Orogastric tube placement. EXAM: ABDOMEN - 1 VIEW COMPARISON:  None. FINDINGS: Tip and side port of the enteric tube below the diaphragm in the stomach. Mild gaseous gastric distension. There is excreted IV contrast within the renal collecting systems. Right upper quadrant surgical clips typical of cholecystectomy. IMPRESSION: Tip and side port of the enteric tube below the diaphragm in the stomach. Electronically Signed   By: Keith Rake M.D.   On: 10/14/2020 21:51   CT HEAD WO CONTRAST  Result Date: 10/15/2020 CLINICAL DATA:  Stroke follow-up. Postop percutaneous clot retrieval. EXAM: CT HEAD WITHOUT CONTRAST TECHNIQUE: Contiguous axial images were obtained from the base of the skull through the vertex without intravenous contrast. COMPARISON:  MRI head 10/15/2020.  CT head 10/14/2020 FINDINGS: Brain: Hypodensity in the left basal ganglia compatible with acute infarct. This involves primarily the head of the caudate and putamen. Small amount of hemorrhage in the putamen is best seen on gradient echo imaging. Small acute infarct also in the left temporal lobe. Infarct distribution similar to the recent MRI. Diffusion-weighted imaging also demonstrated small areas of infarct in the left temporoparietal lobe. No large volume hemorrhage. Ventricle size normal. Mild edema in the head of the caudate with mass-effect on the left frontal horn. No midline shift. Vascular: Negative for hyperdense vessel Skull: Negative Sinuses/Orbits: Mucosal edema and air-fluid levels in the paranasal sinuses. Patient is intubated. 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 Minimal petechial hemorrhage in the left putamen as noted on gradient echo imaging. No large area of hemorrhage. Electronically Signed   By:  Franchot Gallo M.D.   On: 10/15/2020 10:22   MR BRAIN WO CONTRAST  Result Date: 10/15/2020 CLINICAL DATA:  Stroke follow-up EXAM: MRI HEAD WITHOUT CONTRAST TECHNIQUE: Multiplanar, multiecho pulse sequences of  the brain and surrounding structures were obtained without intravenous contrast. COMPARISON:  Head CT from yesterday FINDINGS: Brain: Confluent restricted diffusion in the left stratum and in the left MCA inferior division cortex and white matter. Petechial hemorrhage is seen at the level of the affected basal ganglia. No hydrocephalus, mass, or collection. Vascular: Preserved flow voids. Skull and upper cervical spine: Normal marrow signal Sinuses/Orbits: Nasopharyngeal fluid in the setting of intubation. Negative orbits. Other: Bilateral parotid ductal dilatation without superimposed acute inflammation. Right mastoid opacification. These findings are considered incidental to the presentation. IMPRESSION: Acute infarction in the left MCA territory affecting lenticulostriates and inferior division cortex/white matter. Mild petechial hemorrhage at the level of the left basal ganglia. Electronically Signed   By: Monte Fantasia M.D.   On: 10/15/2020 08:45   DG CHEST PORT 1 VIEW  Result Date: 10/15/2020 CLINICAL DATA:  Respiratory failure EXAM: PORTABLE CHEST 1 VIEW COMPARISON:  09/26/2020 FINDINGS: The endotracheal tube is at the carina. Nasogastric tube extends into the upper abdomen beyond the margin of the examination. Lungs are clear. No pneumothorax or pleural effusion. Cardiac size within normal limits. No acute bone abnormality. IMPRESSION: Endotracheal tube at the carina. Withdrawal by roughly 2 cm would better position the catheter within the distal trachea. Electronically Signed   By: Fidela Salisbury MD   On: 10/15/2020 01:55   CT HEAD CODE STROKE WO CONTRAST`  Result Date: 10/14/2020 CLINICAL DATA:  Code stroke. Right facial droop. Right-sided weakness. EXAM: CT HEAD WITHOUT CONTRAST  TECHNIQUE: Contiguous axial images were obtained from the base of the skull through the vertex without intravenous contrast. COMPARISON:  None. FINDINGS: Brain: Abnormal edema and loss of gray-white differentiation in the left basal ganglia (including the caudate and lentiform nucleus), concerning for acute or subacute infarct. Mild associated mass effect with approximately 2 mm of rightward midline shift at the foramen of Monro. Vascular: No hyperdense vessel identified. Calcific atherosclerosis. Skull: No acute fracture. Sinuses/Orbits: Clear visualized sinuses.  Unremarkable orbits. Other: Small right mastoid effusion. ASPECTS [facility] Stroke Program Early CT Score) - Ganglionic level infarction (caudate, lentiform nuclei, internal capsule, insula, M1-M3 cortex): 5 - Supraganglionic infarction (M4-M6 cortex): 3 Total score (0-10 with 10 being normal): 8 IMPRESSION: 1. Abnormal edema and loss of gray-white differentiation in the left basal ganglia (including the caudate and lentiform nucleus), concerning for acute or subacute infarct. Recommend MRI to confirm and exclude other etiologies. 2. Mild associated mass effect with approximately 2 mm of rightward midline shift at the foramen of Monro. 3. No acute hemorrhage. Findings discussed with Dr. Dwyane Dee via telephone at 5:18 p.m. Electronically Signed   By: Margaretha Sheffield MD   On: 10/14/2020 17:22   ECHOCARDIOGRAM LIMITED  Result Date: 10/15/2020    ECHOCARDIOGRAM LIMITED REPORT   Patient Name:   DURK CARMEN Date of Exam: 10/15/2020 Medical Rec #:  [id number]        Height:       63.0 in Accession #:    [id number]       Weight:       138.9 lb Date of Birth:  01/24/1961       BSA:          1.656 m Patient Age:    17 years         BP:           130/61 mmHg Patient Gender: M                HR:  78 bpm. Exam Location:  Inpatient Procedure: Limited Echo, Limited Color Doppler and Cardiac Doppler Indications:    stroke. coronary artery disease.   History:        Patient has prior history of Echocardiogram examinations, most                 recent 09/26/2020. CHF, CAD; Risk Factors:Dyslipidemia.  Sonographer:    Johny Chess Referring Phys: 23 [place]  Sonographer Comments: Echo performed with patient supine and on artificial respirator. IMPRESSIONS  1. Akinesis of the inferior and inferolateral walls; remaining walls hypokinetic; overall moderate to severe LV dysfunction.  2. Left ventricular ejection fraction, by estimation, is 30 to 35%. The left ventricle has moderate to severely decreased function. The left ventricle demonstrates regional wall motion abnormalities (see scoring diagram/findings for description). The left ventricular internal cavity size was mildly dilated. Left ventricular diastolic parameters are consistent with Grade II diastolic dysfunction (pseudonormalization). Elevated left atrial pressure.  3. Right ventricular systolic function is normal. The right ventricular size is normal.  4. The mitral valve is grossly normal. Trivial mitral valve regurgitation.  5. The aortic valve is tricuspid. Aortic valve regurgitation is moderate. Mild aortic valve sclerosis is present, with no evidence of aortic valve stenosis. FINDINGS  Left Ventricle: Left ventricular ejection fraction, by estimation, is 30 to 35%. The left ventricle has moderate to severely decreased function. The left ventricle demonstrates regional wall motion abnormalities. The left ventricular internal cavity size was mildly dilated. There is no left ventricular hypertrophy. Left ventricular diastolic parameters are consistent with Grade II diastolic dysfunction (pseudonormalization). Elevated left atrial pressure. Right Ventricle: The right ventricular size is normal. Right ventricular systolic function is normal. Left Atrium: Left atrial size was normal in size. Right Atrium: Right atrial size was normal in size. Pericardium: There is no evidence of pericardial  effusion. Mitral Valve: The mitral valve is grossly normal. There is mild thickening of the mitral valve leaflet(s). Trivial mitral valve regurgitation. Tricuspid Valve: The tricuspid valve is normal in structure. Tricuspid valve regurgitation is trivial. Aortic Valve: The aortic valve is tricuspid. Aortic valve regurgitation is moderate. Aortic regurgitation PHT measures 260 msec. Mild aortic valve sclerosis is present, with no evidence of aortic valve stenosis. Pulmonic Valve: The pulmonic valve was grossly normal. Pulmonic valve regurgitation is trivial. IAS/Shunts: The interatrial septum was not well visualized. Additional Comments: Akinesis of the inferior and inferolateral walls; remaining walls hypokinetic; overall moderate to severe LV dysfunction. LEFT VENTRICLE PLAX 2D LVIDd:         5.40 cm  Diastology LVIDs:         5.00 cm  LV e' medial:    5.00 cm/s LV PW:         1.00 cm  LV E/e' medial:  26.2 LV IVS:        0.90 cm  LV e' lateral:   7.18 cm/s LVOT diam:     2.30 cm  LV E/e' lateral: 18.2 LV SV:         93 LV SV Index:   56 LVOT Area:     4.15 cm  IVC IVC diam: 1.90 cm LEFT ATRIUM         Index LA diam:    3.30 cm 1.99 cm/m  AORTIC VALVE LVOT Vmax:   125.00 cm/s LVOT Vmean:  78.400 cm/s LVOT VTI:    0.223 m AI PHT:      260 msec  AORTA Ao Root diam: 3.20 cm  Ao Asc diam:  3.00 cm MITRAL VALVE MV Area (PHT): 4.60 cm     SHUNTS MV Decel Time: 165 msec     Systemic VTI:  0.22 m MV E velocity: 131.00 cm/s  Systemic Diam: 2.30 cm Kirk Ruths MD Electronically signed by Kirk Ruths MD Signature Date/Time: 10/15/2020/9:20:08 AM    Final    CT ANGIO HEAD NECK W WO CM W PERF (CODE STROKE)  Result Date: 10/14/2020 CLINICAL DATA:  Neuro deficit, acute stroke suspected. EXAM: CT ANGIOGRAPHY HEAD AND NECK CT PERFUSION BRAIN TECHNIQUE: Multidetector CT imaging of the head and neck was performed using the standard protocol during bolus administration of intravenous contrast. Multiplanar CT image  reconstructions and MIPs were obtained to evaluate the vascular anatomy. Carotid stenosis measurements (when applicable) are obtained utilizing NASCET criteria, using the distal internal carotid diameter as the denominator. Multiphase CT imaging of the brain was performed following IV bolus contrast injection. Subsequent parametric perfusion maps were calculated using RAPID software. CONTRAST:  177m OMNIPAQUE IOHEXOL 350 MG/ML SOLN COMPARISON:  None. FINDINGS: CTA NECK FINDINGS Aortic arch: Great vessel origins are patent. Mild narrowing of the left subclavian artery origin. Right carotid system: No evidence of dissection, stenosis (50% or greater) or occlusion. Tortuous ICA with retropharyngeal course. Mild atherosclerosis at the carotid bifurcation. Left carotid system: No evidence of dissection, stenosis (50% or greater) or occlusion. Mild atherosclerosis at the carotid bifurcation. Vertebral arteries: Right dominant. The non dominant left vertebral artery is small throughout its course with small transverse foramen. Right vertebral artery is patent without significant (greater than 50%) stenosis. Skeleton: Other neck: Enlarged bilateral parotid ducts, left greater than right without definite radiodense calculus, although evaluation is limited on the CTA. Upper chest: Layering bilateral pleural effusions in the visualized lung apices with overlying ground-glass opacities Review of the MIP images confirms the above findings CTA HEAD FINDINGS Anterior circulation: Bilateral ICAs are patent with calcific atherosclerosis, but no significant (greater than 50%) stenosis. Left M1 MCAs patent. Occlusion versus severe stenosis of a proximal left M2 MCA branch (see series 8, images 121 through 123; series 10, images 120 5 through 128) with irregular distal opacification. Right MCA and bilateral ACAs are patent. No aneurysm identified. Posterior circulation: Bilateral intradural vertebral arteries and the basilar artery  are patent. Bilateral fetal type PCAs without evidence of proximal hemodynamically significant PCA stenosis. Limited evaluation of the distal PCAs due to venous contamination. No aneurysm identified. Venous sinuses: As permitted by contrast timing, patent. Anatomic variants: See above. Review of the MIP images confirms the above findings CT Brain Perfusion Findings: ASPECTS: 8 CBF (<30%) Volume: 080mPerfusion (Tmax>6.0s) volume: 4965mismatch Volume: 61m32mhe amount of penumbra may be underestimated given the more superior left MCA territory is not imaged on perfusion Infarction Location:None identified IMPRESSION: 1. 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 Associated 49 mL of penumbra in the left MCA territory. The volume of penumbra may be underestimated given the more superior left MCA territory is not imaged on perfusion. No evidence of core infarct on RAPID analysis. 2. Layering bilateral pleural effusions in the visualized lung apices with overlying ground-glass opacities, potentially atelectasis. Recommend dedicated chest imaging to further characterize. 3. Significantly enlarged bilateral parotid ducts, left greater than right without definite radiodense calculus, although evaluation is limited on this CTA. Bilateral overlying subcutaneous stranding could represent anasarca, although parotiditis is not excluded. Recommend clinical correlation and consideration of follow-up CT neck (  preferably with contrast) after resolution of the patient's emergent issues. Critical findings discussed with Dr. Quinn Axe At 5:44 p.m. via telephone. Electronically Signed   By: Margaretha Sheffield MD   On: 10/14/2020 18:14    Cardiac Studies   Right/Left Cardiac Catheterization 09/28/2020: Conclusions: Severe single-vessel coronary artery disease with chronic total occlusion of the mid RCA.  There is mild disease involving the left coronary  artery.  Overall appearance is similar to 2019. Mildly to moderately elevated left heart filling pressure. Normal cardiac output/index.   Recommendations: Continue medical therapy of acute on chronic HFrEF and ischemic heart disease. Transfer back to [facility] after completion of post-catheterization recovery for ongoing medical management.  Diagnostic Dominance: Right    Intervention _______________  Echocardiogram 10/15/2020: Impressions: 1. Akinesis of the inferior and inferolateral walls; remaining walls  hypokinetic; overall moderate to severe LV dysfunction.   2. Left ventricular ejection fraction, by estimation, is 30 to 35%. The  left ventricle has moderate to severely decreased function. The left  ventricle demonstrates regional wall motion abnormalities (see scoring  diagram/findings for description). The  left ventricular internal cavity size was mildly dilated. Left ventricular  diastolic parameters are consistent with Grade II diastolic dysfunction  (pseudonormalization). Elevated left atrial pressure.   3. Right ventricular systolic function is normal. The right ventricular  size is normal.   4. The mitral valve is grossly normal. Trivial mitral valve  regurgitation.   5. The aortic valve is tricuspid. Aortic valve regurgitation is moderate.  Mild aortic valve sclerosis is present, with no evidence of aortic valve  stenosis.   Patient Profile     60 y.o. 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 Patient was admitted on 10/09/2020 with suspected osteomyelitis related to onychomycosis and worsening chronic left foot ulcer. He underwent fourth metatarsal head excision, I&D with graft application on 08/26/5463. On 10/14/2020,  he was noted to have flaccid right arm, right facial droop, and aphasia. Code Stroke was called and he was found to have left M2 occlusion with 49 cc penumbra. He was transferred to [facility] for emergent thrombectomy. EKG en route showed ST elevation in inferior and anterolateteral leads. This was felt to be secondary to neurologic event and underlying CTO of RCA but not a true STEMI and decision was made to proceed with thrombectomy.  Assessment & Plan    Abnormal EKG History of CAD with CTO of RCA - EKG after Code Stroke showed diffuse ST elevations in inferior and anterolateral leads. This was not felt to be a true STEMI and was felt to be secondary to neurologic event with known underlying CAD. Recent cardiac catheterization on 6/6 showed severe single vessel CAD with CTO of the mild RCA. Mild disease noted of LAD.  - High-sensitivity troponin minimally elevated at 26 >> 25 >> 25 > 25 >> 25.  - Echo showed LVEF of 30-35% with akinesis of inferior and inferolateral walls and grade 2 diastolic dysfunction. No significant change from last Echo earlier this year. - Patient has persistent ST elevations on EKG this morning. However, troponin minimally and elevated and flat not consistent with ACS. Patient still has aphasia but shakes his head yes when asked about chest pain. However, he is minimally responsive and falls asleep quickly so unclear what to make of  this.  Discussed with MD - ST elevations felt to be secondary to stroke. Also not a candidate for cardiac catheterization or IV Heparin in setting of acute stroke. Continue DAPT with Aspirin and Plavix and high-intensity statin. Home Imdur and Toprol-XL on hold to allow for permissive hypertension.  Ischemic Cardiomyopathy  Chronic Combined CHF  - Echo showed LVEF of 30-35% with akinesis of inferior and inferolateral walls. - Appears euvolemic. - Home Lasix currently on hold. - Was on Lisinopril 44m daily, Toprol-XL 219mdaily, Spironolactone  2579maily, and Imdur 27m51mily  at home. Currently all on hold in setting of recent stroke to allow for permissive hypertension. Restart when able. Looks like he has not been on Entresto due to soft BP in the past. - Continue to monitor volume status closely.  Hypertension - Systolic BP in the 90s 73S140s. - Antihypertensives on hold to allow for permissive hypertension given recent stroke.  Hyperlipidemia - Lipid panel: Total Cholesterol 160, Triglycerides 103, HDL 38, LDL 101.  - LDL goal <70 given CAD and stroke. - Will increase Lipitor to 80mg55mly.  - Will need repeat lipid panel and LFTs in 6-8 weeks.  Acute Stroke - S/p emergent thrombectomy on 10/14/2020. - Continue DAPT and statin.  - Depending on recovery, can consider loop recorder to rule out atrial fibrillation/flutter.  Otherwise, per primary team: - Chronic venous insufficiency with chronic wounds - Chronic anemia - Chronic pain syndrome  - Rheumatic arthritis   For questions or updates, please contact [facility] Please consult [email or web address] for contact info under        Signed, CalliDarreld McleanC  10/15/2020, 10:43 AM

## 2020-10-15 NOTE — Plan of Care (Signed)
After chart review, it is noticed that patient is now intubated and managed on the ventilator. PCCM consulted for medical management while on the ventilator. TRH will sign off at this time. Please re-consult as needed.  Cordelia Poche, MD [facility] 10/15/2020, 7:30 AM

## 2020-10-15 NOTE — Progress Notes (Signed)
RT note. Patient transported to MRI & back without any complications, RT will continue to monitor.

## 2020-10-15 NOTE — Progress Notes (Signed)
  Echocardiogram 2D Echocardiogram has been performed.  Jonathan [name] 10/15/2020, 9:08 AM

## 2020-10-15 NOTE — Progress Notes (Addendum)
STROKE TEAM PROGRESS NOTE   INTERVAL HISTORY His Rn is at the bedside.  He is now following commands and passed SBTs post thrombectomy yesterday. Case d/w CCM and will plan to extubate and stop heparin gtt today. F/u CTH did not show any acute hemorrhage, only some tiny petechial hemorrhage noted on MRI.   Vitals:   10/15/20 1400 10/15/20 1500 10/15/20 1609 10/15/20 1700  BP: (!) 129/56 (!) 124/53 133/68 (!) 126/105  Pulse: 81 88 98 (!) 101  Resp: (!) 22 (!) 21 17 (!) 21  Temp:      TempSrc:      SpO2: 97% 97% 99% 95%  Weight:      Height:       CBC:  Recent Labs  Lab 10/09/20 1859 10/10/20 0351 10/14/20 0321 10/14/20 2157 10/15/20 0500  WBC 6.0   < > 7.0  --  6.1  NEUTROABS 3.6  --   --   --   --   HGB 8.8*   < > 8.5* 8.2* 9.1*  HCT 28.9*   < > 27.5* 24.0* 29.4*  MCV 81.0   < > 78.8*  --  79.0*  PLT 293   < > 284  --  312   < > = values in this interval not displayed.   Basic Metabolic Panel:  Recent Labs  Lab 10/11/20 0409 10/12/20 0400 10/14/20 0321 10/14/20 2157 10/15/20 0500  NA 133* 137 137 139 139  K 3.7 3.6 4.3 3.8 3.8  CL 102 106 108  --  109  CO2 25 25 22   --  20*  GLUCOSE 110* 93 107*  --  88  BUN 8 8 10   --  7  CREATININE 0.68  0.55* 0.61 0.53*  --  0.62  CALCIUM 9.2 9.1 9.1  --  9.2  MG 1.7 1.6* 1.8  --  1.8  PHOS 2.4* 1.9*  --   --   --    Lipid Panel:  Recent Labs  Lab 10/15/20 0500  CHOL 160  TRIG 103  105  HDL 38*  CHOLHDL 4.2  VLDL 21  LDLCALC 101*   HgbA1c:  Recent Labs  Lab 10/15/20 0500  HGBA1C 5.5   Urine Drug Screen:  Recent Labs  Lab 10/15/20 0408  LABOPIA POSITIVE*  COCAINSCRNUR NONE DETECTED  LABBENZ NONE DETECTED  AMPHETMU NONE DETECTED  THCU NONE DETECTED  LABBARB NONE DETECTED    Alcohol Level  Recent Labs  Lab 10/14/20 1853  ETH <10    IMAGING past 24 hours DG Abd 1 View  Result Date: 10/14/2020 CLINICAL DATA:  Orogastric tube placement. EXAM: ABDOMEN - 1 VIEW COMPARISON:  None. FINDINGS: Tip and  side port of the enteric tube below the diaphragm in the stomach. Mild gaseous gastric distension. There is excreted IV contrast within the renal collecting systems. Right upper quadrant surgical clips typical of cholecystectomy. IMPRESSION: Tip and side port of the enteric tube below the diaphragm in the stomach. Electronically Signed   By: Keith Rake M.D.   On: 10/14/2020 21:51   CT HEAD WO CONTRAST  Result Date: 10/15/2020 CLINICAL DATA:  Stroke follow-up. Postop percutaneous clot retrieval. EXAM: CT HEAD WITHOUT CONTRAST TECHNIQUE: Contiguous axial images were obtained from the base of the skull through the vertex without intravenous contrast. COMPARISON:  MRI head 10/15/2020.  CT head 10/14/2020 FINDINGS: Brain: Hypodensity in the left basal ganglia compatible with acute infarct. This involves primarily the head of the caudate and putamen. Small amount  of hemorrhage in the putamen is best seen on gradient echo imaging. Small acute infarct also in the left temporal lobe. Infarct distribution similar to the recent MRI. Diffusion-weighted imaging also demonstrated small areas of infarct in the left temporoparietal lobe. No large volume hemorrhage. Ventricle size normal. Mild edema in the head of the caudate with mass-effect on the left frontal horn. No midline shift. Vascular: Negative for hyperdense vessel Skull: Negative Sinuses/Orbits: Mucosal edema and air-fluid levels in the paranasal sinuses. Patient is intubated. 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 Minimal petechial hemorrhage in the left putamen as noted on gradient echo imaging. No large area of hemorrhage. Electronically Signed   By: Franchot Gallo M.D.   On: 10/15/2020 10:22   MR BRAIN WO CONTRAST  Result Date: 10/15/2020 CLINICAL DATA:  Stroke follow-up EXAM: MRI HEAD WITHOUT CONTRAST TECHNIQUE: Multiplanar, multiecho pulse sequences of the brain and surrounding  structures were obtained without intravenous contrast. COMPARISON:  Head CT from yesterday FINDINGS: Brain: Confluent restricted diffusion in the left stratum and in the left MCA inferior division cortex and white matter. Petechial hemorrhage is seen at the level of the affected basal ganglia. No hydrocephalus, mass, or collection. Vascular: Preserved flow voids. Skull and upper cervical spine: Normal marrow signal Sinuses/Orbits: Nasopharyngeal fluid in the setting of intubation. Negative orbits. Other: Bilateral parotid ductal dilatation without superimposed acute inflammation. Right mastoid opacification. These findings are considered incidental to the presentation. IMPRESSION: Acute infarction in the left MCA territory affecting lenticulostriates and inferior division cortex/white matter. Mild petechial hemorrhage at the level of the left basal ganglia. Electronically Signed   By: Monte Fantasia M.D.   On: 10/15/2020 08:45   IR CT Head Ltd  Result Date: 10/15/2020 INDICATION: 60 year old male with past medical history significant for autism spectrum disorder, RA, systolic CHF, chronic venous stasis with chronic left foot ulcer and recent onychomycosis s/p debridement who was admitted to [facility] with left foot ulcer and osteomyelitis on Vanc and zosyn. He was found to have acute onset of aphasia and right-sided weakness. His last known well was 1 p.m. on 10/14/2020. Head CT showed no evidence of large acute territorial infarct or hemorrhage. CT angiogram of the head and neck showed a proximal left M2/MCA occlusion. CT perfusion showed a 49 mL penumbra without evidence of a core infarct. NIHSS at presentation was 26; baseline modified Rankin scale is 4 related to autism. No IV tPA administered as she was outside the window. He was transferred to [facility] for mechanical thrombectomy. EKG during transport was notable for ST Elevations. Discussed with Dr. Ellyn Hack from cardiology who believes the  EKG changes are related to neurological process. Decision was made to proceed with mechanical thrombectomy. EXAM: ULTRASOUND-GUIDED VASCULAR ACCESS DIAGNOSTIC CEREBRAL ANGIOGRAM FLAT PANEL HEAD CT COMPARISON:  CT/CT angiogram of the head and neck October 14, 2020. MEDICATIONS: Not applicable. ANESTHESIA/SEDATION: The procedure was performed under general anesthesia. CONTRAST:  35 mL of Omnipaque 240 milligram/mL FLUOROSCOPY TIME:  Fluoroscopy Time: 24 minutes 42 seconds (541 mGy). COMPLICATIONS: None immediate. TECHNIQUE: Informed written consent was obtained from the patient after a thorough discussion of the procedural risks, benefits and alternatives. All questions were addressed. Maximal Sterile Barrier Technique was utilized including caps, mask, sterile gowns, sterile gloves, sterile drape, hand hygiene and skin antiseptic. A timeout was performed prior to the initiation of the procedure. The right groin was prepped and draped in the usual sterile fashion. Using  a micropuncture kit and the modified Seldinger technique, access was gained to the right common femoral artery and an 8 French sheath was placed. Real-time ultrasound guidance was utilized for vascular access including the acquisition of a permanent ultrasound image documenting patency of the accessed vessel. Under fluoroscopy, a Zoom 88 guide catheter was navigated over a 6 [place] Berenstein 2 catheter and a 0.035" Terumo Glidewire into the aortic arch. The catheter was placed into the left common carotid artery and then advanced into the left internal carotid artery. The inner catheter was removed. Frontal and lateral angiograms of the head were obtained. FINDINGS: 1. Patent right common femoral artery with adequate caliber for vascular access. 2. Severe tortuosity of the cervical left ICA and left carotid siphon. 3. Occlusion of the proximal left M2/MCA posterior division branch. PROCEDURE: Under biplane roadmap, a zoom 55 aspiration catheter was  navigated over a phenom 21 microcatheter and a Aristotle 14 microguidewire into the cavernous segment of the left ICA. The microcatheter was then navigated over the wire into the left M3/MCA posterior division branch. Then, a 4 x 40 mm solitaire stent retriever was deployed spanning the M2 segment. The device was allowed to intercalated with the clot for 4 minutes. The microcatheter was removed. The aspiration catheter was advanced to the level of occlusion and connected to a penumbra aspiration pump. The thrombectomy device and aspiration catheter were removed under constant aspiration. Left internal carotid artery angiograms with frontal and lateral views of the head showed complete recanalization of the left MCA vascular tree. Flat panel CT of the head was obtained and post processed in a separate workstation with concurrent attending physician supervision. Selected images were sent to PACS. No evidence of hemorrhagic complication. A right common femoral artery angiogram in right anterior oblique view was obtained via sheath side port. Atherosclerotic changes of the external iliac and right common femoral artery without hemodynamically significant stenosis. The vessel has adequate caliber for closure device utilization. The femoral sheath was exchanged over the wire for an 8 [place] Angio-Seal which was utilized for access closure. Immediate hemostasis was achieved. IMPRESSION: Successful mechanical thrombectomy performed for treatment of a proximal left M2/MCA posterior division branch occlusion. One pass performed with combined aspiration and stent retriever with complete recanalization (TICI 3). No thromboembolic or hemorrhagic complication. PLAN: Transferred to ICU for continued care. Electronically Signed   By: Pedro Earls M.D.   On: 10/15/2020 13:52   IR US Guide Vasc Access Right  Result Date: 10/15/2020 INDICATION: 60 year old male with past medical history significant for autism  spectrum disorder, RA, systolic CHF, chronic venous stasis with chronic left foot ulcer and recent onychomycosis s/p debridement who was admitted to [facility] with left foot ulcer and osteomyelitis on Vanc and zosyn. He was found to have acute onset of aphasia and right-sided weakness. His last known well was 1 p.m. on 10/14/2020. Head CT showed no evidence of large acute territorial infarct or hemorrhage. CT angiogram of the head and neck showed a proximal left M2/MCA occlusion. CT perfusion showed a 49 mL penumbra without evidence of a core infarct. NIHSS at presentation was 26; baseline modified Rankin scale is 4 related to autism. No IV tPA administered as she was outside the window. He was transferred to [facility] for mechanical thrombectomy. EKG during transport was notable for ST Elevations. Discussed with Dr. Ellyn Hack from cardiology who believes the EKG changes are related to neurological process. Decision was made to proceed with mechanical thrombectomy.  EXAM: ULTRASOUND-GUIDED VASCULAR ACCESS DIAGNOSTIC CEREBRAL ANGIOGRAM FLAT PANEL HEAD CT COMPARISON:  CT/CT angiogram of the head and neck October 14, 2020. MEDICATIONS: Not applicable. ANESTHESIA/SEDATION: The procedure was performed under general anesthesia. CONTRAST:  35 mL of Omnipaque 240 milligram/mL FLUOROSCOPY TIME:  Fluoroscopy Time: 24 minutes 42 seconds (541 mGy). COMPLICATIONS: None immediate. TECHNIQUE: Informed written consent was obtained from the patient after a thorough discussion of the procedural risks, benefits and alternatives. All questions were addressed. Maximal Sterile Barrier Technique was utilized including caps, mask, sterile gowns, sterile gloves, sterile drape, hand hygiene and skin antiseptic. A timeout was performed prior to the initiation of the procedure. The right groin was prepped and draped in the usual sterile fashion. Using a micropuncture kit and the modified Seldinger technique, access was gained to the right  common femoral artery and an 8 French sheath was placed. Real-time ultrasound guidance was utilized for vascular access including the acquisition of a permanent ultrasound image documenting patency of the accessed vessel. Under fluoroscopy, a Zoom 88 guide catheter was navigated over a 6 [place] Berenstein 2 catheter and a 0.035" Terumo Glidewire into the aortic arch. The catheter was placed into the left common carotid artery and then advanced into the left internal carotid artery. The inner catheter was removed. Frontal and lateral angiograms of the head were obtained. FINDINGS: 1. Patent right common femoral artery with adequate caliber for vascular access. 2. Severe tortuosity of the cervical left ICA and left carotid siphon. 3. Occlusion of the proximal left M2/MCA posterior division branch. PROCEDURE: Under biplane roadmap, a zoom 55 aspiration catheter was navigated over a phenom 21 microcatheter and a Aristotle 14 microguidewire into the cavernous segment of the left ICA. The microcatheter was then navigated over the wire into the left M3/MCA posterior division branch. Then, a 4 x 40 mm solitaire stent retriever was deployed spanning the M2 segment. The device was allowed to intercalated with the clot for 4 minutes. The microcatheter was removed. The aspiration catheter was advanced to the level of occlusion and connected to a penumbra aspiration pump. The thrombectomy device and aspiration catheter were removed under constant aspiration. Left internal carotid artery angiograms with frontal and lateral views of the head showed complete recanalization of the left MCA vascular tree. Flat panel CT of the head was obtained and post processed in a separate workstation with concurrent attending physician supervision. Selected images were sent to PACS. No evidence of hemorrhagic complication. A right common femoral artery angiogram in right anterior oblique view was obtained via sheath side port. Atherosclerotic  changes of the external iliac and right common femoral artery without hemodynamically significant stenosis. The vessel has adequate caliber for closure device utilization. The femoral sheath was exchanged over the wire for an 8 [place] Angio-Seal which was utilized for access closure. Immediate hemostasis was achieved. IMPRESSION: Successful mechanical thrombectomy performed for treatment of a proximal left M2/MCA posterior division branch occlusion. One pass performed with combined aspiration and stent retriever with complete recanalization (TICI 3). No thromboembolic or hemorrhagic complication. PLAN: Transferred to ICU for continued care. Electronically Signed   By: Pedro Earls M.D.   On: 10/15/2020 13:52   DG CHEST PORT 1 VIEW  Result Date: 10/15/2020 CLINICAL DATA:  Respiratory failure EXAM: PORTABLE CHEST 1 VIEW COMPARISON:  09/26/2020 FINDINGS: The endotracheal tube is at the carina. Nasogastric tube extends into the upper abdomen beyond the margin of the examination. Lungs are clear. No pneumothorax or pleural effusion. Cardiac size within normal  limits. No acute bone abnormality. IMPRESSION: Endotracheal tube at the carina. Withdrawal by roughly 2 cm would better position the catheter within the distal trachea. Electronically Signed   By: Fidela Salisbury MD   On: 10/15/2020 01:55   IR PERCUTANEOUS ART THROMBECTOMY/INFUSION INTRACRANIAL INC DIAG ANGIO  Result Date: 10/15/2020 INDICATION: 60 year old male with past medical history significant for autism spectrum disorder, RA, systolic CHF, chronic venous stasis with chronic left foot ulcer and recent onychomycosis s/p debridement who was admitted to [facility] with left foot ulcer and osteomyelitis on Vanc and zosyn. He was found to have acute onset of aphasia and right-sided weakness. His last known well was 1 p.m. on 10/14/2020. Head CT showed no evidence of large acute territorial infarct or hemorrhage. CT angiogram of the  head and neck showed a proximal left M2/MCA occlusion. CT perfusion showed a 49 mL penumbra without evidence of a core infarct. NIHSS at presentation was 26; baseline modified Rankin scale is 4 related to autism. No IV tPA administered as she was outside the window. He was transferred to [facility] for mechanical thrombectomy. EKG during transport was notable for ST Elevations. Discussed with Dr. Ellyn Hack from cardiology who believes the EKG changes are related to neurological process. Decision was made to proceed with mechanical thrombectomy. EXAM: ULTRASOUND-GUIDED VASCULAR ACCESS DIAGNOSTIC CEREBRAL ANGIOGRAM FLAT PANEL HEAD CT COMPARISON:  CT/CT angiogram of the head and neck October 14, 2020. MEDICATIONS: Not applicable. ANESTHESIA/SEDATION: The procedure was performed under general anesthesia. CONTRAST:  35 mL of Omnipaque 240 milligram/mL FLUOROSCOPY TIME:  Fluoroscopy Time: 24 minutes 42 seconds (541 mGy). COMPLICATIONS: None immediate. TECHNIQUE: Informed written consent was obtained from the patient after a thorough discussion of the procedural risks, benefits and alternatives. All questions were addressed. Maximal Sterile Barrier Technique was utilized including caps, mask, sterile gowns, sterile gloves, sterile drape, hand hygiene and skin antiseptic. A timeout was performed prior to the initiation of the procedure. The right groin was prepped and draped in the usual sterile fashion. Using a micropuncture kit and the modified Seldinger technique, access was gained to the right common femoral artery and an 8 French sheath was placed. Real-time ultrasound guidance was utilized for vascular access including the acquisition of a permanent ultrasound image documenting patency of the accessed vessel. Under fluoroscopy, a Zoom 88 guide catheter was navigated over a 6 [place] Berenstein 2 catheter and a 0.035" Terumo Glidewire into the aortic arch. The catheter was placed into the left common carotid artery and then  advanced into the left internal carotid artery. The inner catheter was removed. Frontal and lateral angiograms of the head were obtained. FINDINGS: 1. Patent right common femoral artery with adequate caliber for vascular access. 2. Severe tortuosity of the cervical left ICA and left carotid siphon. 3. Occlusion of the proximal left M2/MCA posterior division branch. PROCEDURE: Under biplane roadmap, a zoom 55 aspiration catheter was navigated over a phenom 21 microcatheter and a Aristotle 14 microguidewire into the cavernous segment of the left ICA. The microcatheter was then navigated over the wire into the left M3/MCA posterior division branch. Then, a 4 x 40 mm solitaire stent retriever was deployed spanning the M2 segment. The device was allowed to intercalated with the clot for 4 minutes. The microcatheter was removed. The aspiration catheter was advanced to the level of occlusion and connected to a penumbra aspiration pump. The thrombectomy device and aspiration catheter were removed under constant aspiration. Left internal carotid artery angiograms with frontal and lateral views  of the head showed complete recanalization of the left MCA vascular tree. Flat panel CT of the head was obtained and post processed in a separate workstation with concurrent attending physician supervision. Selected images were sent to PACS. No evidence of hemorrhagic complication. A right common femoral artery angiogram in right anterior oblique view was obtained via sheath side port. Atherosclerotic changes of the external iliac and right common femoral artery without hemodynamically significant stenosis. The vessel has adequate caliber for closure device utilization. The femoral sheath was exchanged over the wire for an 8 [place] Angio-Seal which was utilized for access closure. Immediate hemostasis was achieved. IMPRESSION: Successful mechanical thrombectomy performed for treatment of a proximal left M2/MCA posterior division branch  occlusion. One pass performed with combined aspiration and stent retriever with complete recanalization (TICI 3). No thromboembolic or hemorrhagic complication. PLAN: Transferred to ICU for continued care. Electronically Signed   By: Pedro Earls M.D.   On: 10/15/2020 13:52   ECHOCARDIOGRAM LIMITED  Result Date: 10/15/2020    ECHOCARDIOGRAM LIMITED REPORT   [name] Date of Exam: 10/15/2020 Medical Rec #:  [id number]        Height:       63.0 in Accession #:    [id number]       Weight:       138.9 lb Date of Birth:  09-23-1960       BSA:          1.656 m Patient Age:    16 years         BP:           130/61 mmHg Patient Gender: M                HR:           78 bpm. Exam Location:  Inpatient Procedure: Limited Echo, Limited Color Doppler and Cardiac Doppler Indications:    stroke. coronary artery disease.  History:        Patient has prior history of Echocardiogram examinations, most                 recent 09/26/2020. CHF, CAD; Risk Factors:Dyslipidemia.  Sonographer:    Johny Chess Referring Phys: [id number] Orange  Sonographer Comments: Echo performed with patient supine and on artificial respirator. IMPRESSIONS  1. Akinesis of the inferior and inferolateral walls; remaining walls hypokinetic; overall moderate to severe LV dysfunction.  2. Left ventricular ejection fraction, by estimation, is 30 to 35%. The left ventricle has moderate to severely decreased function. The left ventricle demonstrates regional wall motion abnormalities (see scoring diagram/findings for description). The left ventricular internal cavity size was mildly dilated. Left ventricular diastolic parameters are consistent with Grade II diastolic dysfunction (pseudonormalization). Elevated left atrial pressure.  3. Right ventricular systolic function is normal. The right ventricular size is normal.  4. The mitral valve is grossly normal. Trivial mitral valve regurgitation.  5. The aortic valve  is tricuspid. Aortic valve regurgitation is moderate. Mild aortic valve sclerosis is present, with no evidence of aortic valve stenosis. FINDINGS  Left Ventricle: Left ventricular ejection fraction, by estimation, is 30 to 35%. The left ventricle has moderate to severely decreased function. The left ventricle demonstrates regional wall motion abnormalities. The left ventricular internal cavity size was mildly dilated. There is no left ventricular hypertrophy. Left ventricular diastolic parameters are consistent with Grade II diastolic dysfunction (pseudonormalization). Elevated left atrial pressure. Right Ventricle: The right ventricular size is  normal. Right ventricular systolic function is normal. Left Atrium: Left atrial size was normal in size. Right Atrium: Right atrial size was normal in size. Pericardium: There is no evidence of pericardial effusion. Mitral Valve: The mitral valve is grossly normal. There is mild thickening of the mitral valve leaflet(s). Trivial mitral valve regurgitation. Tricuspid Valve: The tricuspid valve is normal in structure. Tricuspid valve regurgitation is trivial. Aortic Valve: The aortic valve is tricuspid. Aortic valve regurgitation is moderate. Aortic regurgitation PHT measures 260 msec. Mild aortic valve sclerosis is present, with no evidence of aortic valve stenosis. Pulmonic Valve: The pulmonic valve was grossly normal. Pulmonic valve regurgitation is trivial. IAS/Shunts: The interatrial septum was not well visualized. Additional Comments: Akinesis of the inferior and inferolateral walls; remaining walls hypokinetic; overall moderate to severe LV dysfunction. LEFT VENTRICLE PLAX 2D LVIDd:         5.40 cm  Diastology LVIDs:         5.00 cm  LV e' medial:    5.00 cm/s LV PW:         1.00 cm  LV E/e' medial:  26.2 LV IVS:        0.90 cm  LV e' lateral:   7.18 cm/s LVOT diam:     2.30 cm  LV E/e' lateral: 18.2 LV SV:         93 LV SV Index:   56 LVOT Area:     4.15 cm  IVC IVC  diam: 1.90 cm LEFT ATRIUM         Index LA diam:    3.30 cm 1.99 cm/m  AORTIC VALVE LVOT Vmax:   125.00 cm/s LVOT Vmean:  78.400 cm/s LVOT VTI:    0.223 m AI PHT:      260 msec  AORTA Ao Root diam: 3.20 cm Ao Asc diam:  3.00 cm MITRAL VALVE MV Area (PHT): 4.60 cm     SHUNTS MV Decel Time: 165 msec     Systemic VTI:  0.22 m MV E velocity: 131.00 cm/s  Systemic Diam: 2.30 cm Kirk Ruths MD Electronically signed by Kirk Ruths MD Signature Date/Time: 10/15/2020/9:20:08 AM    Final     PHYSICAL EXAM General: Appears well-developed mild distress, intubated. Psych: Affect appropriate to situation Eyes: No scleral injection HENT: No OP obstrucion Head: Normocephalic.  Cardiovascular: Normal rate and regular rhythm. Reviewed EKG changes. Respiratory: Effort normal and breath sounds normal to anterior ascultation; Vent GI: Soft.  No distension. There is no tenderness.  Skin: WDI    Neurological Examination Mental Status: Alert, Unable to speak d/t ETT, but seems to nod yes/no appropriately. Able to follow 3 step commands without difficulty. Cranial Nerves: II: decreased blink on Right, possible partial left hemianopsia III,IV, VI: ptosis not present, Right gaze pref, but able to cross midline. extra-ocular motions intact bilaterally, pupils equal, round, reactive to light and accommodation V,VII: unable to test facial symmetry d/t ETT, facial light touch sensation normal bilaterally VIII: hearing normal bilaterally IX,X: uvula rises symmetrically XI: bilateral shoulder shrug XII: moves tongue around ETT/ extension Motor: Tone and bulk:normal tone throughout; no atrophy noted, Right arm/leg has some movements 2/5 leg, 3/5 arm, but cannot hold against gravity. Freely moving left side wnl Sensory: Pinprick and light touch intact throughout, bilaterally Deep Tendon Reflexes: 2+ and symmetric throughout Plantars: Right: downgoing   Left: downgoing Cerebellar: normal finger-to-nose, normal  rapid alternating movements and normal heel-to-shin test Gait: normal gait and station   ASSESSMENT/PLAN Jonathan [name] is a 59 y.o. male initially admitted with left foot ulcer and osteo on vanc and zosyn, noted to have acute R sided weakness and aphasia and transferred to [facility] for thrombectomy for potential LMCA M2 occlusion vs high grade stenosis. EKG enroute with ST elevation concerning for STEMI; this was d/w cardiology on call, they reviewed his recent cath and thought the the EKG findings likely from known RCA rather than LAD. Heparin gtt was started.   Stroke: infarct embolic appearing; work up underway for better etiology.  Code Stroke CT head No acute abnormality. Small vessel disease. Atrophy. ASPECTS 10.    CTA head & neck LM1 occlusion, s/p thrombectomy with TICI 3 flow F/u [facility] 6/23 without bleed. Heparin gtt was stopped. CT perfusion 22m penumbra with no core. MRI  LMCA and BG with small petechial hemorrhage noted 2D Echo 30-35% EF, akinesis of inferior/inferolateral walls; hypokinetic; severe LV dysfunction. LV dilation, LA normal.  LDL 101 HgbA1c 5.5 VTE prophylaxis - post procedure with SCDs    Diet   Diet Carb Modified Fluid consistency: Thin; Room service appropriate? Yes with Assist   aspirin 81 mg daily prior to admission, now on heparin IV. Plan to stop heparin today, then DAPT to be started x3 weeks, then plavix only Therapy recommendations:  pending Disposition:  pending  Hypertension Home meds:  imdur, Aldactone, Toprol XL Stable Permissive hypertension (OK if < 220/120) but gradually normalize in 5-7 days Long-term BP goal normotensive  Hyperlipidemia Home meds:  none, resumed in hospital LDL 101, goal < 70 Add Lipitor 481mHigh intensity statin  Continue statin at discharge  Diabetes type II- no dx HgbA1c 5.5, goal < 7.0 CBGs Recent Labs    10/14/20 1658  GLUCAP 115*    SSI  Other Stroke Risk Factors  Former Cigarette smoker Coronary  artery disease Congestive heart failure  Other Active Problems STEMI- Cardiology following. After d/w CCM and cards, heparin gtt stopped today Diastolic HF Developmental delay, Autism spectrum disorder Left foot Wound with Osteomyelitis- abx per primary team  Hospital day # 6  Desiree Metzger-Cihelka, ARNP-C, ANVP-BC Pager: [phone] I have personally obtained history,examined this patient, reviewed notes, independently viewed imaging studies, participated in medical decision making and plan of care.ROS completed by me personally and pertinent positives fully documented  I have made any additions or clarifications directly to the above note. Agree with note above.  Patient developed sudden onset of aphasia right hemiparesis due to left M1 occlusion and underwent successful thrombectomy and has done well postextubation.  Continue close neurological monitoring and strict blood pressure control as per post thrombectomy protocol.  Systolic blood pressure goal 1 20-1 40 for the first 24 hours.  Mobilize out of bed.  Physical occupational and speech therapy consults.  Repeat stat CT scan of the head and if no blood is noted may continue heparin from cardiac standpoint though no indication from neuro standpoint.  Discussed with Dr. LaNoemi Chapelritical care medicine.  No family available at the bedside for discussion during rounds. This patient is critically ill and at significant risk of neurological worsening, death and care requires constant monitoring of vital signs, hemodynamics,respiratory and cardiac monitoring, extensive review of multiple databases, frequent neurological assessment, discussion with family, other specialists and medical decision making of high complexity.I have made any additions or clarifications directly to the above note.This critical care time does not reflect procedure time, or teaching time or supervisory time of PA/NP/Med Resident etc but could involve care  discussion  time.  I spent 40 minutes of neurocritical care time  in the care of  this patient.      Antony Contras, MD Medical Director [place] Pager: [phone] 10/15/2020 6:24 PM  To contact Stroke Continuity provider, please refer to [email or web address] After hours, contact General Neurology

## 2020-10-15 NOTE — Progress Notes (Signed)
   10/15/20 1400  Provider Notification  Provider Name/Title Kennieth Rad NP & C. Sarajane Jews PA  Date Provider Notified 10/15/20  Time Provider Notified 1400  Notification Type Page  Notification Reason Other (Comment) (15 beat run vtach, asymptomic)  Provider response See new orders  Date of Provider Response 10/15/20  Time of Provider Response 1400

## 2020-10-15 NOTE — Progress Notes (Signed)
Vent patient transported from 4N28 to CT and back without any complications.

## 2020-10-15 NOTE — Evaluation (Signed)
Physical Therapy Evaluation Patient Details Name: Jonathan [name] MRN: [id number] DOB: 10-13-1960 Today's Date: 10/15/2020   History of Present Illness  The pt is a 60 yo male originally presenting 6/19 for 4th metatarsal head excision and I&D due to possible osteomyelitis. On 6/22, pt with acute onset R sided weakness and aphasia. Imaging showed L M2 occlusion, s/p thrombectomy on 6/22. Extubated 6/23. 15 beat Vtach on 6/23. PMH includes: RA, CHF, chronic venous stasis with L foot wound, HLD, and chronic anemia.   Clinical Impression  Pt in bed upon arrival of PT, agreeable to evaluation at this time. The pt was unable to give consistent answers to questions regarding PLOF and living situation, but per chart, the pt was mobilizing with crutches, receiving assist from aide and family with ADLs and IADLs. The pt now presents with limitations in functional mobility, strength, ROM, power, coordination, and stability due to above dx, and will continue to benefit from skilled PT to address these deficits. The pt required modA to complete bed mobility at this time, but was able to maintain static sitting once positioned at EOB. The pt c/o onset of worsening headache once in sitting, so was returned to supine (see vitals below). The pt will continue to benefit from skilled PT to further progress OOB mobility, strength, and transfer capacity to reduce caregiver burden given new mobility deficits.   BP: - 122/58 (76) supine in bed prior to session; - 139/66 (86) with initial transition to sitting EOB; - 153/55 (72) once sitting EOB with (pt c/o headache that was getting worse, returned to supine as SBP goal <140) - 133/68 supine in bed at end of session   Follow Up Recommendations CIR    Equipment Recommendations   (defer to post acute)    Recommendations for Other Services Rehab consult     Precautions / Restrictions Precautions Precautions: Fall Precaution Comments: SBP< 140; post op shoe (not  in room after transfer from [facility]) Restrictions Weight Bearing Restrictions: Yes LLE Weight Bearing: Weight bearing as tolerated Other Position/Activity Restrictions: in post-op shoe      Mobility  Bed Mobility Overal bed mobility: Needs Assistance Bed Mobility: Supine to Sit;Sit to Supine     Supine to sit: Mod assist;HOB elevated Sit to supine: Mod assist;HOB elevated   General bed mobility comments: pt able to complete LE movement to EOB, then needing min-modA to pull trunk to sit. able to sustain sitting EOB once positioned at EOB    Transfers Overall transfer level: Needs assistance Equipment used: 1 person hand held assist Transfers: Sit to/from Stand;Lateral/Scoot Transfers Sit to Stand: Total assist        Lateral/Scoot Transfers: Total assist General transfer comment: pt unable to achieve sit-stand despite totalA, facilitation at hips, and blocking at L knee. Pt attempting to assist, but unable to generate power to stand. totalA to scoot laterally along EOB  Ambulation/Gait             General Gait Details: deferred due to onset pt headache in sitting and inability to stand   Modified Rankin (Stroke Patients Only) Modified Rankin (Stroke Patients Only) Pre-Morbid Rankin Score: Moderately severe disability Modified Rankin: Severe disability     Balance Overall balance assessment: Needs assistance Sitting-balance support: Feet supported;Single extremity supported Sitting balance-Leahy Scale: Poor Sitting balance - Comments: no overt LOB, unable to reach outside BOS     Standing balance-Leahy Scale: Zero Standing balance comment: unable to achieve stand  Pertinent Vitals/Pain Pain Assessment: Faces Faces Pain Scale: Hurts even more Pain Location: headache Pain Descriptors / Indicators: Aching;Headache;Throbbing Pain Intervention(s): Limited activity within patient's tolerance;Repositioned;Monitored during session     [place] expects to be discharged to:: Private residence Living Arrangements: Spouse/significant other Available Help at Discharge: Family;Available 24 hours/day Type of Home: House Home Access: Stairs to enter Entrance Stairs-Rails: Psychiatric nurse of Steps: 5 Home Layout: One level Home Equipment: Toilet riser;Shower seat;Bedside commode;Hospital bed;Wheelchair - power Additional Comments: has a lift chair that heps him stand. has a hospital bed that he elevates as well for standing. - all information from prior charting prior to onset of aphasia. pt stating he lives at home with his wife during my evaluation today.    Prior Function Level of Independence: Needs assistance   Gait / Transfers Assistance Needed: pt states he ambulates with a cane, but very inconsistent with answers and will benefit from further evaluation  ADL's / Homemaking Assistance Needed: aide comes daily for assistance with bathing and dressing, getting out of bed,  longer utensiles for feeding,        Hand Dominance   Dominant Hand: Right    Extremity/Trunk Assessment   Upper Extremity Assessment Upper Extremity Assessment: Generalized weakness;Defer to OT evaluation    Lower Extremity Assessment Lower Extremity Assessment: Generalized weakness;Difficult to assess due to impaired cognition;RLE deficits/detail;LLE deficits/detail RLE Deficits / Details: pt able to perform parital ROM against gravity but demos good motion at ankle, knee, and hip. unable to clearly explain sensation, needed cues for positioning and motor planning LLE Deficits / Details: pt able to move through PROM, complete AROM of knee against gravity while sitting EOB. significant genu varus at rest.    Cervical / Trunk Assessment Cervical / Trunk Assessment: Normal  Communication   Communication: Expressive difficulties  Cognition Arousal/Alertness: Awake/alert Behavior During Therapy: Flat  affect Overall Cognitive Status: Difficult to assess Area of Impairment: Following commands;Safety/judgement;Awareness;Orientation;Memory                 Orientation Level: Disoriented to;Time (pt stating yes to "is it nighttime")   Memory: Decreased short-term memory Following Commands: Follows one step commands inconsistently;Follows one step commands with increased time Safety/Judgement: Decreased awareness of safety;Decreased awareness of deficits     General Comments: difficult to assess with expressive aphasia. pt unable to follow some cues when given verbally only, but then making good attempt with addition of visual cues for pt to copy (for example with showing a thumbs up). Pt able to follow some commands with verbal cues only such as "bend your knee". Pt perseverating on answers, repeating same answer for next few questions      General Comments General comments (skin integrity, edema, etc.): BP 122/58 (76) supine in bed prior to session; 139/66 (86) with initial transition to sitting EOB; 153/55 (72) once sitting EOB with pt c/o headache that was getting worse. returned to supine as SBP goal <140. BP 133/68 supine in bed at end of session        Assessment/Plan    PT Assessment Patient needs continued PT services  PT Problem List Decreased strength;Decreased mobility;Decreased safety awareness;Decreased balance;Decreased activity tolerance       PT Treatment Interventions DME instruction;Therapeutic exercise;Gait training;Functional mobility training;Therapeutic activities;Stair training;Patient/family education;Balance training;Neuromuscular re-education    PT Goals (Current goals can be found in the Care Plan section)  Acute Rehab PT Goals Patient Stated Goal: to go home PT Goal Formulation: With patient Time For  Goal Achievement: 10/29/20 Potential to Achieve Goals: Good    Frequency Min 4X/week   Barriers to discharge   unsure of caregiver support        AM-PAC PT "6 Clicks" Mobility  Outcome Measure Help needed turning from your back to your side while in a flat bed without using bedrails?: A Little Help needed moving from lying on your back to sitting on the side of a flat bed without using bedrails?: A Lot Help needed moving to and from a bed to a chair (including a wheelchair)?: Total Help needed standing up from a chair using your arms (e.g., wheelchair or bedside chair)?: Total Help needed to walk in hospital room?: Total Help needed climbing 3-5 steps with a railing? : Total 6 Click Score: 9    End of Session Equipment Utilized During Treatment: Gait belt Activity Tolerance: Patient tolerated treatment well Patient left: in bed;with call bell/phone within reach;with bed alarm set Nurse Communication: Mobility status PT Visit Diagnosis: Unsteadiness on feet (R26.81);Other abnormalities of gait and mobility (R26.89)    Time: 1216-2446 PT Time Calculation (min) (ACUTE ONLY): 29 min   Charges:   PT Evaluation $PT Eval Moderate Complexity: 1 Mod PT Treatments $Therapeutic Activity: 8-22 mins        Karma Ganja, PT, DPT   Acute Rehabilitation Department Pager #: [phone]  Otho Bellows 10/15/2020, 4:40 PM

## 2020-10-15 NOTE — Progress Notes (Signed)
Extubated this morning. No issues so far.  BP (!) 129/56   Pulse 81   Temp 98.7 F (37.1 C) (Axillary)   Resp (!) 22   Ht 5\' 3"  (1.6 m)   Wt 63 kg   SpO2 97%   BMI 24.60 kg/m  Sleeping, arouses easily to verbal stimulation-- eyes remain partially closed still, but attempting to track.  No tachypnea or accessory muscle use. No upper airway secretions.  Julian Hy, DO 10/15/20 2:54 PM [facility]

## 2020-10-15 NOTE — Procedures (Signed)
Extubation Procedure Note  Patient Details:   Name: Jonathan [name] DOB: 1960-10-01 MRN: [id number]   Airway Documentation:    Vent end date: 10/15/20 Vent end time: 1045   Evaluation  O2 sats: stable throughout Complications: No apparent complications Patient did tolerate procedure well. Bilateral Breath Sounds: Diminished   Yes  Lyon Dumont 10/15/2020, 10:56 AM

## 2020-10-15 NOTE — Anesthesia Preprocedure Evaluation (Addendum)
Anesthesia Evaluation  Patient identified by MRN, date of birth, ID band Patient confused    Reviewed: Patient's Chart, lab work & pertinent test resultsPreop documentation limited or incomplete due to emergent nature of procedure.  Airway Mallampati: III  TM Distance: >3 FB     Dental  (+) Teeth Intact   Pulmonary former smoker,     + decreased breath sounds      Cardiovascular  Rhythm:Regular Rate:Normal     Neuro/Psych    GI/Hepatic   Endo/Other    Renal/GU      Musculoskeletal   Abdominal   Peds  Hematology   Anesthesia Other Findings   Reproductive/Obstetrics                             Anesthesia Physical Anesthesia Plan  ASA: 4 and emergent  Anesthesia Plan: General   Post-op Pain Management:    Induction: Intravenous, Rapid sequence and Cricoid pressure planned  PONV Risk Score and Plan: Ondansetron and Dexamethasone  Airway Management Planned: Oral ETT  Additional Equipment: Arterial line  Intra-op Plan:   Post-operative Plan: Post-operative intubation/ventilation  Informed Consent: I have reviewed the patients History and Physical, chart, labs and discussed the procedure including the risks, benefits and alternatives for the proposed anesthesia with the patient or authorized representative who has indicated his/her understanding and acceptance.     History available from chart only and Only emergency history available  Plan Discussed with: CRNA and Anesthesiologist  Anesthesia Plan Comments:        Anesthesia Quick Evaluation

## 2020-10-15 NOTE — Progress Notes (Signed)
Referring Physician(s): CODE STROKE  Supervising Physician: Pedro Earls  Patient Status:  [facility] - In-pt  Chief Complaint: Left M2/MCA occlusion s/p mechanical thrombectomy 6/22 with Dr Tennis Must Sindy Messing  Subjective:  Patient laying in bed, just received a bath. Per RN patient just extubated about 30 minutes ago, was able to say a few words to them but some expressive aphasia noted. When asked how he is feeling he states "fine" but is unable to elaborate further.   Allergies: Infliximab  Medications: Prior to Admission medications   Medication Sig Start Date End Date Taking? Authorizing Provider  acetaminophen (TYLENOL) 325 MG tablet Take 2 tablets (650 mg total) by mouth every 4 (four) hours as needed for headache or mild pain. 08/24/19  Yes Allie Bossier, MD  aspirin EC 81 MG tablet Take 1 tablet (81 mg total) by mouth daily. 08/27/19  Yes Minus Breeding, MD  atorvastatin (LIPITOR) 40 MG tablet Take 40 mg by mouth daily.   Yes [provider]  celecoxib (CELEBREX) 200 MG capsule Take by mouth. 09/14/20 12/13/20 Yes [provider]  cetirizine (ZYRTEC) 10 MG tablet Take 10 mg by mouth daily as needed for allergies. 07/16/18  Yes [provider]  docusate sodium (COLACE) 100 MG capsule Take 1 capsule (100 mg total) by mouth 2 (two) times daily. Patient taking differently: Take 100 mg by mouth daily as needed for mild constipation. 08/24/19  Yes Allie Bossier, MD  fluticasone [facility]) 50 MCG/ACT nasal spray Place 1 spray into the nose daily as needed for allergies. 07/16/18  Yes [provider]  furosemide (LASIX) 40 MG tablet Take 1 tablet (40 mg total) by mouth daily. 09/30/20  Yes Jennye Boroughs, MD  gabapentin (NEURONTIN) 300 MG capsule Take 300 mg by mouth 2 (two) times daily.  03/24/16  Yes [provider]  isosorbide mononitrate (IMDUR) 60 MG 24 hr tablet Take 1 tablet (60 mg total) by mouth daily. 08/25/19  Yes Allie Bossier, MD  leflunomide (ARAVA) 10 MG tablet Take 1 tablet by mouth daily. 05/20/20  Yes [provider]  lisinopril (ZESTRIL) 5 MG tablet Take 10 mg by mouth daily. 02/13/20 02/12/21 Yes [provider]  metoprolol succinate (TOPROL-XL) 25 MG 24 hr tablet Take 25 mg by mouth daily. 09/09/20 03/08/21 Yes [provider]  montelukast (SINGULAIR) 10 MG tablet Take 10 mg by mouth at bedtime.   Yes [provider]  omeprazole (PRILOSEC) 20 MG capsule Take 20 mg by mouth daily.    Yes [provider]  predniSONE (DELTASONE) 5 MG tablet Take 5 mg by mouth 2 (two) times daily with a meal. 07/03/20  Yes [provider]  spironolactone (ALDACTONE) 25 MG tablet Take 25 mg by mouth daily. 09/09/20  Yes [provider]  triamcinolone (KENALOG) 0.1 % Apply 1 application topically daily as needed (to flaky skin). 05/07/20  Yes [provider]  rivaroxaban (XARELTO) 10 MG TABS tablet Take 10 mg by mouth daily with breakfast. 06/10/11 07/19/11  Mcarthur Rossetti, MD     Vital Signs: BP 122/65   Pulse 77   Temp (!) 97.2 F (36.2 C) (Axillary)   Resp 18   Ht 5\' 3"  (1.6 m)   Wt 138 lb 14.2 oz (63 kg)   SpO2 100%   BMI 24.60 kg/m   Physical Exam Vitals and nursing note reviewed.  Constitutional:      General: He is not in acute distress. HENT:  Head: Normocephalic.  Cardiovascular:     Rate and Rhythm: Normal rate.     Comments: (+) right CFA puncture site clean, dry, dressed appropriately. Soft, non tender, non pulsatile, no active bleeding or drainage. Pulmonary:     Effort: Pulmonary effort is normal.  Neurological:     Mental Status: He is alert.  Alert, awake, and oriented to self Some expressive aphasia, able to state he feels "fine" and "good" but does not provide much more history PERRL bilaterally EOMs without nystagmus or subjective diplopia. Visual fields grossly No obvious facial asymmetry. Able to move  all 4 extremities although globally weak    Imaging: DG Abd 1 View  Result Date: 10/14/2020 CLINICAL DATA:  Orogastric tube placement. EXAM: ABDOMEN - 1 VIEW COMPARISON:  None. FINDINGS: Tip and side port of the enteric tube below the diaphragm in the stomach. Mild gaseous gastric distension. There is excreted IV contrast within the renal collecting systems. Right upper quadrant surgical clips typical of cholecystectomy. IMPRESSION: Tip and side port of the enteric tube below the diaphragm in the stomach. Electronically Signed   By: Keith Rake M.D.   On: 10/14/2020 21:51   CT HEAD WO CONTRAST  Result Date: 10/15/2020 CLINICAL DATA:  Stroke follow-up. Postop percutaneous clot retrieval. EXAM: CT HEAD WITHOUT CONTRAST TECHNIQUE: Contiguous axial images were obtained from the base of the skull through the vertex without intravenous contrast. COMPARISON:  MRI head 10/15/2020.  CT head 10/14/2020 FINDINGS: Brain: Hypodensity in the left basal ganglia compatible with acute infarct. This involves primarily the head of the caudate and putamen. Small amount of hemorrhage in the putamen is best seen on gradient echo imaging. Small acute infarct also in the left temporal lobe. Infarct distribution similar to the recent MRI. Diffusion-weighted imaging also demonstrated small areas of infarct in the left temporoparietal lobe. No large volume hemorrhage. Ventricle size normal. Mild edema in the head of the caudate with mass-effect on the left frontal horn. No midline shift. Vascular: Negative for hyperdense vessel Skull: Negative Sinuses/Orbits: Mucosal edema and air-fluid levels in the paranasal sinuses. Patient is intubated. 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 Minimal petechial hemorrhage in the left putamen as noted on gradient echo imaging. No large area of hemorrhage. Electronically Signed   By: Franchot Gallo M.D.   On: 10/15/2020 10:22    MR BRAIN WO CONTRAST  Result Date: 10/15/2020 CLINICAL DATA:  Stroke follow-up EXAM: MRI HEAD WITHOUT CONTRAST TECHNIQUE: Multiplanar, multiecho pulse sequences of the brain and surrounding structures were obtained without intravenous contrast. COMPARISON:  Head CT from yesterday FINDINGS: Brain: Confluent restricted diffusion in the left stratum and in the left MCA inferior division cortex and white matter. Petechial hemorrhage is seen at the level of the affected basal ganglia. No hydrocephalus, mass, or collection. Vascular: Preserved flow voids. Skull and upper cervical spine: Normal marrow signal Sinuses/Orbits: Nasopharyngeal fluid in the setting of intubation. Negative orbits. Other: Bilateral parotid ductal dilatation without superimposed acute inflammation. Right mastoid opacification. These findings are considered incidental to the presentation. IMPRESSION: Acute infarction in the left MCA territory affecting lenticulostriates and inferior division cortex/white matter. Mild petechial hemorrhage at the level of the left basal ganglia. Electronically Signed   By: Monte Fantasia M.D.   On: 10/15/2020 08:45   DG CHEST PORT 1 VIEW  Result Date: 10/15/2020 CLINICAL DATA:  Respiratory failure EXAM: PORTABLE CHEST 1 VIEW COMPARISON:  09/26/2020 FINDINGS: The endotracheal tube is  at the carina. Nasogastric tube extends into the upper abdomen beyond the margin of the examination. Lungs are clear. No pneumothorax or pleural effusion. Cardiac size within normal limits. No acute bone abnormality. IMPRESSION: Endotracheal tube at the carina. Withdrawal by roughly 2 cm would better position the catheter within the distal trachea. Electronically Signed   By: Fidela Salisbury MD   On: 10/15/2020 01:55   CT HEAD CODE STROKE WO CONTRAST`  Result Date: 10/14/2020 CLINICAL DATA:  Code stroke. Right facial droop. Right-sided weakness. EXAM: CT HEAD WITHOUT CONTRAST TECHNIQUE: Contiguous axial images were obtained  from the base of the skull through the vertex without intravenous contrast. COMPARISON:  None. FINDINGS: Brain: Abnormal edema and loss of gray-white differentiation in the left basal ganglia (including the caudate and lentiform nucleus), concerning for acute or subacute infarct. Mild associated mass effect with approximately 2 mm of rightward midline shift at the foramen of Monro. Vascular: No hyperdense vessel identified. Calcific atherosclerosis. Skull: No acute fracture. Sinuses/Orbits: Clear visualized sinuses.  Unremarkable orbits. Other: Small right mastoid effusion. ASPECTS [facility] Stroke Program Early CT Score) - Ganglionic level infarction (caudate, lentiform nuclei, internal capsule, insula, M1-M3 cortex): 5 - Supraganglionic infarction (M4-M6 cortex): 3 Total score (0-10 with 10 being normal): 8 IMPRESSION: 1. Abnormal edema and loss of gray-white differentiation in the left basal ganglia (including the caudate and lentiform nucleus), concerning for acute or subacute infarct. Recommend MRI to confirm and exclude other etiologies. 2. Mild associated mass effect with approximately 2 mm of rightward midline shift at the foramen of Monro. 3. No acute hemorrhage. Findings discussed with Dr. Dwyane Dee via telephone at 5:18 p.m. Electronically Signed   By: Margaretha Sheffield MD   On: 10/14/2020 17:22   ECHOCARDIOGRAM LIMITED  Result Date: 10/15/2020    ECHOCARDIOGRAM LIMITED REPORT   [name] Date of Exam: 10/15/2020 Medical Rec #:  [id number]        Height:       63.0 in Accession #:    [id number]       Weight:       138.9 lb Date of Birth:  1961/01/06       BSA:          1.656 m Patient Age:    3 years         BP:           130/61 mmHg Patient Gender: M                HR:           78 bpm. Exam Location:  Inpatient Procedure: Limited Echo, Limited Color Doppler and Cardiac Doppler Indications:    stroke. coronary artery disease.  History:        Patient has prior history of  Echocardiogram examinations, most                 recent 09/26/2020. CHF, CAD; Risk Factors:Dyslipidemia.  Sonographer:    Johny Chess Referring Phys: [address]  Sonographer Comments: Echo performed with patient supine and on artificial respirator. IMPRESSIONS  1. Akinesis of the inferior and inferolateral walls; remaining walls hypokinetic; overall moderate to severe LV dysfunction.  2. Left ventricular ejection fraction, by estimation, is 30 to 35%. The left ventricle has moderate to severely decreased function. The left ventricle demonstrates regional wall motion abnormalities (see scoring diagram/findings for description). The left ventricular internal cavity size was mildly dilated. Left ventricular diastolic parameters are consistent  with Grade II diastolic dysfunction (pseudonormalization). Elevated left atrial pressure.  3. Right ventricular systolic function is normal. The right ventricular size is normal.  4. The mitral valve is grossly normal. Trivial mitral valve regurgitation.  5. The aortic valve is tricuspid. Aortic valve regurgitation is moderate. Mild aortic valve sclerosis is present, with no evidence of aortic valve stenosis. FINDINGS  Left Ventricle: Left ventricular ejection fraction, by estimation, is 30 to 35%. The left ventricle has moderate to severely decreased function. The left ventricle demonstrates regional wall motion abnormalities. The left ventricular internal cavity size was mildly dilated. There is no left ventricular hypertrophy. Left ventricular diastolic parameters are consistent with Grade II diastolic dysfunction (pseudonormalization). Elevated left atrial pressure. Right Ventricle: The right ventricular size is normal. Right ventricular systolic function is normal. Left Atrium: Left atrial size was normal in size. Right Atrium: Right atrial size was normal in size. Pericardium: There is no evidence of pericardial effusion. Mitral Valve: The mitral valve is  grossly normal. There is mild thickening of the mitral valve leaflet(s). Trivial mitral valve regurgitation. Tricuspid Valve: The tricuspid valve is normal in structure. Tricuspid valve regurgitation is trivial. Aortic Valve: The aortic valve is tricuspid. Aortic valve regurgitation is moderate. Aortic regurgitation PHT measures 260 msec. Mild aortic valve sclerosis is present, with no evidence of aortic valve stenosis. Pulmonic Valve: The pulmonic valve was grossly normal. Pulmonic valve regurgitation is trivial. IAS/Shunts: The interatrial septum was not well visualized. Additional Comments: Akinesis of the inferior and inferolateral walls; remaining walls hypokinetic; overall moderate to severe LV dysfunction. LEFT VENTRICLE PLAX 2D LVIDd:         5.40 cm  Diastology LVIDs:         5.00 cm  LV e' medial:    5.00 cm/s LV PW:         1.00 cm  LV E/e' medial:  26.2 LV IVS:        0.90 cm  LV e' lateral:   7.18 cm/s LVOT diam:     2.30 cm  LV E/e' lateral: 18.2 LV SV:         93 LV SV Index:   56 LVOT Area:     4.15 cm  IVC IVC diam: 1.90 cm LEFT ATRIUM         Index LA diam:    3.30 cm 1.99 cm/m  AORTIC VALVE LVOT Vmax:   125.00 cm/s LVOT Vmean:  78.400 cm/s LVOT VTI:    0.223 m AI PHT:      260 msec  AORTA Ao Root diam: 3.20 cm Ao Asc diam:  3.00 cm MITRAL VALVE MV Area (PHT): 4.60 cm     SHUNTS MV Decel Time: 165 msec     Systemic VTI:  0.22 m MV E velocity: 131.00 cm/s  Systemic Diam: 2.30 cm Kirk Ruths MD Electronically signed by Kirk Ruths MD Signature Date/Time: 10/15/2020/9:20:08 AM    Final    CT ANGIO HEAD NECK W WO CM W PERF (CODE STROKE)  Result Date: 10/14/2020 CLINICAL DATA:  Neuro deficit, acute stroke suspected. EXAM: CT ANGIOGRAPHY HEAD AND NECK CT PERFUSION BRAIN TECHNIQUE: Multidetector CT imaging of the head and neck was performed using the standard protocol during bolus administration of intravenous contrast. Multiplanar CT image reconstructions and MIPs were obtained to evaluate  the vascular anatomy. Carotid stenosis measurements (when applicable) are obtained utilizing NASCET criteria, using the distal internal carotid diameter as the denominator. Multiphase CT imaging of the brain was performed  following IV bolus contrast injection. Subsequent parametric perfusion maps were calculated using RAPID software. CONTRAST:  160mL OMNIPAQUE IOHEXOL 350 MG/ML SOLN COMPARISON:  None. FINDINGS: CTA NECK FINDINGS Aortic arch: Great vessel origins are patent. Mild narrowing of the left subclavian artery origin. Right carotid system: No evidence of dissection, stenosis (50% or greater) or occlusion. Tortuous ICA with retropharyngeal course. Mild atherosclerosis at the carotid bifurcation. Left carotid system: No evidence of dissection, stenosis (50% or greater) or occlusion. Mild atherosclerosis at the carotid bifurcation. Vertebral arteries: Right dominant. The non dominant left vertebral artery is small throughout its course with small transverse foramen. Right vertebral artery is patent without significant (greater than 50%) stenosis. Skeleton: Other neck: Enlarged bilateral parotid ducts, left greater than right without definite radiodense calculus, although evaluation is limited on the CTA. Upper chest: Layering bilateral pleural effusions in the visualized lung apices with overlying ground-glass opacities Review of the MIP images confirms the above findings CTA HEAD FINDINGS Anterior circulation: Bilateral ICAs are patent with calcific atherosclerosis, but no significant (greater than 50%) stenosis. Left M1 MCAs patent. Occlusion versus severe stenosis of a proximal left M2 MCA branch (see series 8, images 121 through 123; series 10, images 120 5 through 128) with irregular distal opacification. Right MCA and bilateral ACAs are patent. No aneurysm identified. Posterior circulation: Bilateral intradural vertebral arteries and the basilar artery are patent. Bilateral fetal type PCAs without  evidence of proximal hemodynamically significant PCA stenosis. Limited evaluation of the distal PCAs due to venous contamination. No aneurysm identified. Venous sinuses: As permitted by contrast timing, patent. Anatomic variants: See above. Review of the MIP images confirms the above findings CT Brain Perfusion Findings: ASPECTS: 8 CBF (<30%) Volume: 51mL Perfusion (Tmax>6.0s) volume: 9mL Mismatch Volume: 29mL. The amount of penumbra may be underestimated given the more superior left MCA territory is not imaged on perfusion Infarction Location:None identified IMPRESSION: 1. 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 Associated 49 mL of penumbra in the left MCA territory. The volume of penumbra may be underestimated given the more superior left MCA territory is not imaged on perfusion. No evidence of core infarct on RAPID analysis. 2. Layering bilateral pleural effusions in the visualized lung apices with overlying ground-glass opacities, potentially atelectasis. Recommend dedicated chest imaging to further characterize. 3. Significantly enlarged bilateral parotid ducts, left greater than right without definite radiodense calculus, although evaluation is limited on this CTA. Bilateral overlying subcutaneous stranding could represent anasarca, although parotiditis is not excluded. Recommend clinical correlation and consideration of follow-up CT neck (preferably with contrast) after resolution of the patient's emergent issues. Critical findings discussed with Dr. Quinn Axe At 5:44 p.m. via telephone. Electronically Signed   By: Margaretha Sheffield MD   On: 10/14/2020 18:14    Labs:  CBC: Recent Labs    10/11/20 0409 10/12/20 0400 10/14/20 0321 10/14/20 2157 10/15/20 0500  WBC 9.2 10.1 7.0  --  6.1  HGB 8.8* 9.2* 8.5* 8.2* 9.1*  HCT 28.0* 29.9* 27.5* 24.0* 29.4*  PLT 272 277 284  --  312    COAGS: No results for input(s):  INR, APTT in the last 8760 hours.  BMP: Recent Labs    10/11/20 0409 10/12/20 0400 10/14/20 0321 10/14/20 2157 10/15/20 0500  NA 133* 137 137 139 139  K 3.7 3.6 4.3 3.8 3.8  CL 102 106 108  --  109  CO2 25 25 22   --  20*  GLUCOSE 110*  93 107*  --  88  BUN 8 8 10   --  7  CALCIUM 9.2 9.1 9.1  --  9.2  CREATININE 0.68  0.55* 0.61 0.53*  --  0.62  GFRNONAA >60  >60 >60 >60  --  >60    LIVER FUNCTION TESTS: Recent Labs    09/26/20 0133 10/10/20 0351 10/11/20 0409 10/12/20 0400 10/15/20 0500  BILITOT 1.3* 0.4  --   --  0.8  AST 34 19  --   --  30  ALT 12 11  --   --  21  ALKPHOS 75 80  --   --  69  PROT 6.9 6.5  --   --  6.1*  ALBUMIN 2.9* 2.9* 2.9* 2.5* 2.4*    Assessment and Plan:  60 y/o M who was admitted originally on 6/17 due to worsening chronic left foot wound for which he underwent I&D with debridement, 4th metatarsal head resection and graft placement with podiatry. On 6/22 he developed AMS and right sided weakness so code stroke was activated and imaging showed an occlusion of the left M2/MCA. He was brought to [facility] and underwent successful thrombectomy. He is seen today for follow up.  Per chart patient autism spectrum disorder and his baseline mentation is questionable, he is able to reply "fine", "good" or "ok" to most questions/statements but is unable to give anymore information. Able to move all 4 extremities but is globally weak. Right CFA puncture site unremarkable, soft, non tender.  Further plans per neurology/primary team - NIR remains available as needed, please all with questions or concerns.  Electronically Signed: Joaquim Nam, PA-C 10/15/2020, 11:19 AM   I spent a total of 15 Minutes at the the patient's bedside AND on the patient's hospital floor or unit, greater than 50% of which was counseling/coordinating care for left M2/MCA thrombectomy follow up.

## 2020-10-15 NOTE — Progress Notes (Addendum)
NAME:  Jonathan [name], MRN:  [id number], DOB:  13-Oct-1960, LOS: 6 ADMISSION DATE:  10/09/2020, CONSULTATION DATE:  6/23 REFERRING MD:  Dr. Lorrin Goodell, CHIEF COMPLAINT:  L MCA M2 occlusion   History of Present Illness:  Patient is a 60 yo M w/ PMH of autism, RA, HFrEF (30-35%), chronic venous stasis w/ left foot ulcer, recent onychomycosis s/p debridement admitted to [facility] on 6/17 with L foot ulcer and osteomyelitis on Vanc and zosyn; required 4th metatarsal head excision.  On 6/22, code stroke activated for acute aphasia and R sided weakness at 1300. CTH negative. CTA head and neck w/ L MCA M2 occlusion vs severe stenosis. CT perfusion w/ 49 mL penumbra w/ no core. Patient transferred to [facility] for thrombectomy. EKG during transport w/ ST elevations. Cardiology consulted and looke at recent cardiac cath on 09/28/2020 and does not think LAD was occluded; probably from RCA occlusion. Cardiology recommended to proceed with thrombectomy at this time. Mechanical thrombectomy performed w/ complete recanalization. Patient intubated for procedure and arrived to 4N ICU on mechanical ventilation.  PCCM consulted for airway management and medical management.    Pertinent  Medical History   Past Medical History:  Diagnosis Date   Anemia    H/o using iron in the past    Arthritis    rheumatoid   Chronic back pain    Chronic HFrEF (heart failure with reduced ejection fraction) ([place]) 08/2019   Admitted 6 4-7/20/2022 with CHF   Coronary artery disease, occlusive 08/01/2019   RCA CTO with left-to-right collaterals.  Widely patent LCA-large LAD was several small diagonal branches and small diffusely diseased LCx with multiple OM branches.   Hyperlipidemia    Ischemic cardiomyopathy 09/02/2019   Initial EF was 40 to 45%, as of June 2022 EF now 30 to 35%.  Known RCA occlusion.  There is inferior and inferoseptal akinesis on echo.   Joint pain    Joint swelling    Rheumatoid arthritis(714.0)    Significant  Hospital Events: Including procedures, antibiotic start and stop dates in addition to other pertinent events   6/17: Admitted [facility] for L foot ulcer w/ osteomyelitis; started on Vanc and Zosyn 6/22: Code Stroke; CTA head and neck w/ L MCA M2 occlusion vs severe stenosis; transferred to [facility] for thrombectomy c/w complete revascularization.  STE on EKG noted, cards consulted  Interim History / Subjective:  No events overnight Propofol off overnight ~1am, not needed, remains calms, following commands MRI brain overnight, pending results  Remains afebrile, normal WBC Weaning on PSV 5/5 since 0730  Remains on heparin gtt  Objective   Blood pressure 130/61, pulse 88, temperature (!) 97.5 F (36.4 C), temperature source Axillary, resp. rate (!) 0, height 5\' 3"  (1.6 m), weight 63 kg, SpO2 100 %.    Vent Mode: PRVC FiO2 (%):  [40 %-100 %] 40 % Set Rate:  [16 bmp-18 bmp] 16 bmp Vt Set:  [460 mL] 460 mL PEEP:  [5 cmH20] 5 cmH20 Plateau Pressure:  [8 cmH20-15 cmH20] 8 cmH20   Intake/Output Summary (Last 24 hours) at 10/15/2020 0732 Last data filed at 10/15/2020 0500 Gross per 24 hour  Intake 2203.56 ml  Output 1225 ml  Net 978.56 ml   Filed Weights   10/11/20 0500 10/14/20 0454 10/15/20 0500  Weight: 58 kg 63 kg 63 kg   Examination: General:  adult male resting on MV in NAD HEENT: MM pink/moist, some increased oral secretions/ drooling at times, pupils 2-3/reactive, ETT/ OGT Neuro: Awakens to  voice, f/c in all extremities, RUE/ RLE weaker, unable to lift against gravity, LUE 4/5, LLE 3/5   CV: s1s2 rr, NSR some PACs, right groin site wnl, R +dp PULM:  non labored on PSV 5/5, slightly coarse, minimal clear secretions, +cough GI: soft, bs+, condom cath Extremities: warm/dry, no LE edema, left foot dressed, venous stasis of LEs Skin: no rashes   Labs/imaging that I havepersonally reviewed  (right click and "Reselect all SmartList Selections" daily)  CXR- this am, ETT at [facility], since  retracted  CBC, BMET stable EKG- unchanged from 6/23, but new changes since 6/4 MRI brain pending   Resolved Hospital Problem list     Assessment & Plan:   L MCA M2 occlusion s/p mech thrombectomy 6/22 w/complete revascularization; out of window for tPA  P: - Per stroke team - MRI brain overnight pending - ongoing neuro checks - A1C 5.5, glucose monitoring remains < 180, lipid panel noted, continue ASA/Plavix - Echo w/ bubble ordered - BP goal per neurology 120-140 - PT/OT/SLP consulted   Post-Operative Acute Respiratory Failure secondary to Mechanical Thrombectomy P: - passed SBT this am, will extubate after morning meds given per OGT - d/c PAD protocol - SLP ordered - ongoing pulmonary hygiene   STEMI: noted in route from [facility] to [facility]; Cardiology believes likely due to neurologic event; recent [facility] 6/6 showing chronic total RCA occlusion, EF 30-35%, left coronaries patent P: - appreciate cardiology input - troponin hs trend flat thus far, repeat EKG and troponin this am  - if remains unchanged, consider stopping heparin gtt and continue with normal medical management  Chronic L foot ulcer cellulitis: Xray and MRI concerning for early osteomyelitis. s/p 4th metatarsal excision 6/17 by podiatry P: - per podiatry - surgical pathology neg for osteo; left foot wound cx growing proteus and staph cohnii > will change from vanc/ zosyn (day 7) to doxycycline/ ceftaz for 10 days of abx - ongoing PT/ OT eval  Abnormal ABI P: - vascular surgery consulted: no need for intervention - continue statins and ASA  Chronic HFrEF: Echo 09/26/2020: EF 30-35%; Mod decreased LV function; grade II diastolic dystfunction. Essential HTN HLD P: - BP has been soft overnight (mostly while on propofol) since within goal since stopping.  Continue holding home lasix, aldactone, imdur, metoprolol, and lisinopril for now, appreciate cards input on home meds - stop MIVF - continue statin  Rheumatoid  Arthritis P: - ongoing home Arava and prednisone 5 mg daily   Anemia of chronic disease P: - CBC stable, trend  Chronic pain syndrome P: - gabapentin  Best Practice    Diet/type: NPO, SLP to assess Pain/Anxiety/Delirium protocol: d/c  VAP protocol (if indicated): Yes DVT prophylaxis: systemic heparin GI prophylaxis: PPI added Glucose control:  not indicated, trend on BMET Central venous access:  N/A Arterial line:  Yes, and it is no longer needed, will d/c Foley:  N/A Mobility:  bed rest progress as tolerated PT consulted: Yes Studies pending: MRI and other :Echo Culture data pending:none Last reviewed culture data:today Antibiotics:zosyn and vanc changing to doxycycline and ceftaz 6/23 Antibiotic de-escalation: yes, de-escalation ordered.  Stop date: ordered 10 days of abx therapy, currently day 7/10 Daily labs: ordered Code Status:  full code Last date of multidisciplinary goals of care discussion [pending] ccm prognosis: Serious Disposition: remains critically ill, will stay in intensive care  Labs   CBC: Recent Labs  Lab 10/09/20 1859 10/10/20 0351 10/11/20 0409 10/12/20 0400 10/14/20 0321 10/14/20 2157 10/15/20 0500  WBC 6.0 4.7 9.2 10.1 7.0  --  6.1  NEUTROABS 3.6  --   --   --   --   --   --   HGB 8.8* 9.1* 8.8* 9.2* 8.5* 8.2* 9.1*  HCT 28.9* 29.7* 28.0* 29.9* 27.5* 24.0* 29.4*  MCV 81.0 80.7 78.9* 78.7* 78.8*  --  79.0*  PLT 293 250 272 277 284  --  248    Basic Metabolic Panel: Recent Labs  Lab 10/10/20 0351 10/11/20 0409 10/12/20 0400 10/14/20 0321 10/14/20 2157 10/15/20 0500  NA 141 133* 137 137 139 139  K 3.6 3.7 3.6 4.3 3.8 3.8  CL 110 102 106 108  --  109  CO2 27 25 25 22   --  20*  GLUCOSE 106* 110* 93 107*  --  88  BUN 10 8 8 10   --  7  CREATININE 0.57* 0.68  0.55* 0.61 0.53*  --  0.62  CALCIUM 9.5 9.2 9.1 9.1  --  9.2  MG  --  1.7 1.6* 1.8  --  1.8  PHOS  --  2.4* 1.9*  --   --   --    GFR: Estimated Creatinine Clearance:  80 mL/min (by C-G formula based on SCr of 0.62 mg/dL). Recent Labs  Lab 10/11/20 0409 10/12/20 0400 10/14/20 0321 10/15/20 0500  WBC 9.2 10.1 7.0 6.1    Liver Function Tests: Recent Labs  Lab 10/10/20 0351 10/11/20 0409 10/12/20 0400 10/15/20 0500  AST 19  --   --  30  ALT 11  --   --  21  ALKPHOS 80  --   --  69  BILITOT 0.4  --   --  0.8  PROT 6.5  --   --  6.1*  ALBUMIN 2.9* 2.9* 2.5* 2.4*   No results for input(s): LIPASE, AMYLASE in the last 168 hours. No results for input(s): AMMONIA in the last 168 hours.  ABG    Component Value Date/Time   PHART 7.483 (H) 10/14/2020 2157   PCO2ART 37.5 10/14/2020 2157   PO2ART 548 (H) 10/14/2020 2157   HCO3 28.1 (H) 10/14/2020 2157   TCO2 29 10/14/2020 2157   O2SAT 100.0 10/14/2020 2157     Coagulation Profile: No results for input(s): INR, PROTIME in the last 168 hours.  Cardiac Enzymes: No results for input(s): CKTOTAL, CKMB, CKMBINDEX, TROPONINI in the last 168 hours.  HbA1C: Hgb A1c MFr Bld  Date/Time Value Ref Range Status  10/15/2020 05:00 AM 5.5 4.8 - 5.6 % Final    Comment:    (NOTE) Pre diabetes:          5.7%-6.4%  Diabetes:              >6.4%  Glycemic control for   <7.0% adults with diabetes   08/21/2019 05:12 PM 5.7 (H) 4.8 - 5.6 % Final    Comment:    (NOTE) Pre diabetes:          5.7%-6.4% Diabetes:              >6.4% Glycemic control for   <7.0% adults with diabetes     CBG: Recent Labs  Lab 10/14/20 1658  GLUCAP 115*    Critical care time: 35 mins       Kennieth Rad, ACNP [facility] 10/15/2020, 7:33 AM

## 2020-10-15 NOTE — Evaluation (Signed)
Speech Language Pathology Evaluation Patient Details Name: Jonathan [name] MRN: [id number] DOB: Sep 13, 1960 Today's Date: 10/15/2020 Time: 7017-7939 SLP Time Calculation (min) (ACUTE ONLY): 15 min  Problem List:  Patient Active Problem List   Diagnosis Date Noted   Endotracheal tube present    Ischemic stroke ([place]) 10/14/2020   Acute ischemic left MCA stroke (HCC)    Acute osteomyelitis of ankle and foot ([place]) 10/10/2020   Chronic foot ulcer with fat layer exposed, left ([place]) 10/10/2020   Foot ulcer with fat layer exposed, left ([place]) 10/09/2020   Elevated troponin    CHF, acute on chronic ([place]) 09/26/2020   Chest pain in adult 09/26/2020   Venous ulcer of left lower extremity with varicose veins ([place]) 09/22/2020   Acute diffuse otitis externa of left ear 03/07/2020   Chronic total occlusion of coronary artery 12/26/2019   Coronary artery disease involving native coronary artery of native heart 12/26/2019   Fall at home 12/26/2019   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[place]) 12/26/2019   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[place]) 12/09/2019   Physical debility 11/21/2019   Syncope 11/21/2019   DDD (degenerative disc disease), lumbar 09/27/2019   Open wound of left foot 09/27/2019   Open wound of right foot 09/27/2019   Pain due to onychomycosis of toenails of both feet 08/28/2019   Acute on chronic HFrEF (heart failure with reduced ejection fraction) ([place]) 08/22/2019   Chronic pain syndrome 08/22/2019   Rheumatoid arthritis ([place]) 08/22/2019   Venous stasis ulcers ([place]) 08/22/2019   Anemia, unspecified 08/22/2019   NSTEMI (non-ST elevated myocardial infarction) ([place]) 08/21/2019   Cervical spondylosis without myelopathy 08/21/2019   Lumbar facet arthropathy 08/14/2019   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   B12 deficiency 02/08/2019   Glucose intolerance (impaired glucose tolerance) 02/08/2019   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   Hyperglycemia 12/27/2018   Cellulitis 12/27/2018    Wound drainage 12/22/2018   Foot ulcer ([place]) 12/21/2018   Encounter for screening for HIV 12/21/2018   Arthritis 08/22/2018   HLD (hyperlipidemia) 08/22/2018   Lung nodule 08/22/2018   Renal cyst, right 08/22/2018   Rheumatoid arthritis involving multiple sites with positive rheumatoid factor ([place]) 08/22/2018   Venous insufficiency 08/22/2018   Vitamin D deficiency 08/22/2018   Seasonal allergic rhinitis 07/16/2018   Essential hypertension 02/23/2018   Insomnia 02/23/2018   Tinea versicolor 02/23/2018   Antalgic gait 09/21/2017   Chronic pain of both knees 09/21/2017   Foot pain, bilateral 09/21/2017   Elevated liver enzymes 07/10/2017   Leg edema 06/08/2017   Primary osteoarthritis, left ankle and foot 05/09/2017   Post-traumatic osteoarthritis, right ankle and foot 05/09/2017   Primary osteoarthritis, right ankle and foot 05/09/2017   Erectile dysfunction 02/15/2017   Excessive sweating 02/15/2017   Difficulty transferring 01/27/2017   Acquired bilateral flat feet 11/15/2016   Other acquired hammer toe 11/15/2016   Erosive gastritis 09/05/2016   At high risk for falls 08/15/2016   Chronic bilateral low back pain without sciatica 10/13/2015   Drug therapy 08/03/2015   Rheumatoid arthritis involving left hip ([place]) 01/29/2015   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 11/11/2014   Status post total replacement of right hip 11/11/2014   Gait difficulty 08/21/2013   Benign neoplasm of colon 04/03/2013   Reflux esophagitis 04/03/2013   Microcytic anemia 04/01/2013   Chronic ulcer of right leg ([place]) 01/13/2013   Pressure ulcer 01/13/2013   Ankylosis of knee joint 07/19/2011   Degenerative arthritis of  knee 06/07/2011   Past Medical History:  Past Medical History:  Diagnosis Date   Anemia    H/o using iron in the past    Arthritis    rheumatoid   Chronic back pain    Chronic HFrEF (heart failure with  reduced ejection fraction) ([place]) 08/2019   Admitted 6 4-7/20/2022 with CHF   Coronary artery disease, occlusive 08/01/2019   RCA CTO with left-to-right collaterals.  Widely patent LCA-large LAD was several small diagonal branches and small diffusely diseased LCx with multiple OM branches.   Hyperlipidemia    Ischemic cardiomyopathy 09/02/2019   Initial EF was 40 to 45%, as of June 2022 EF now 30 to 35%.  Known RCA occlusion.  There is inferior and inferoseptal akinesis on echo.   Joint pain    Joint swelling    Rheumatoid arthritis(714.0)    Past Surgical History:  Past Surgical History:  Procedure Laterality Date   COLONOSCOPY N/A 04/03/2013   Procedure: COLONOSCOPY;  Surgeon: Irene Shipper, MD;  Location: [facility];  Service: Endoscopy;  Laterality: N/A;   COLONOSCOPY     ENDOVENOUS ABLATION SAPHENOUS VEIN W/ LASER Left 09/26/2019   endovenous laser ablation left greater saphenous vein by Gae Gallop MD    ENDOVENOUS ABLATION SAPHENOUS VEIN W/ LASER Right 10/31/2019   endovenous laser ablation right greater saphenous vein by Gae Gallop MD    ESOPHAGOGASTRODUODENOSCOPY N/A 04/03/2013   Procedure: ESOPHAGOGASTRODUODENOSCOPY (EGD);  Surgeon: Irene Shipper, MD;  Location: [facility];  Service: Endoscopy;  Laterality: N/A;  changed to moderate dr. Henrene Pastor did not want to go to the o.r. for an endo colon-jmt   EYE SURGERY Left    "when i was a very small boy"   GRAFT APPLICATION Left 54/62/7035   Procedure: GRAFT APPLICATION;  Surgeon: Landis Martins, DPM;  Location: [facility];  Service: Podiatry;  Laterality: Left;  [place] (rep already contacted: Matt Sides)   INCISION AND DRAINAGE OF WOUND Left 10/11/2020   Procedure: IRRIGATION AND DEBRIDEMENT WOUND;  Surgeon: Landis Martins, DPM;  Location: [facility];  Service: Podiatry;  Laterality: Left;   IR CT HEAD LTD  10/14/2020   IR PERCUTANEOUS ART THROMBECTOMY/INFUSION INTRACRANIAL INC DIAG ANGIO  10/14/2020   IR US GUIDE VASC ACCESS RIGHT   10/14/2020   JOINT REPLACEMENT     both knees   KNEE ARTHROPLASTY  06/07/2011   Procedure: COMPUTER ASSISTED TOTAL KNEE ARTHROPLASTY;  Surgeon: Mcarthur Rossetti, MD;  Location: [facility];  Service: Orthopedics;  Laterality: Right;  Right total knee arthoplasty   KNEE CLOSED REDUCTION  07/19/2011   Procedure: CLOSED MANIPULATION KNEE;  Surgeon: Mcarthur Rossetti, MD;  Location: [facility];  Service: Orthopedics;  Laterality: Right;  Manipulation under anesthesia right knee   LEFT HEART CATH AND CORONARY ANGIOGRAPHY N/A 08/21/2019   Procedure: LEFT HEART CATH AND CORONARY ANGIOGRAPHY;  Surgeon: Belva Crome, MD;  Location: [facility];  Service: Cardiovascular; Mid to distal RCA CTO with faint left-to-right collaterals.  Widely patent left main.  Mid to distal LCx mild luminal irregularities.  Multiple OM branches.  Large draping LAD with several small diagonal branches.  EF ~40 to 45% with inferior AK.   METATARSAL HEAD EXCISION Left 10/11/2020   Procedure: METATARSAL HEAD EXCISION;  Surgeon: Landis Martins, DPM;  Location: [facility];  Service: Podiatry;  Laterality: Left;  4th metatarsal   RADIOLOGY WITH ANESTHESIA N/A 10/14/2020   Procedure: IR WITH ANESTHESIA;  Surgeon: Radiologist, Medication, MD;  Location:  [facility];  Service: Radiology;  Laterality: N/A;   RIGHT/LEFT HEART CATH AND CORONARY ANGIOGRAPHY N/A 09/28/2020   Procedure: RIGHT/LEFT HEART CATH AND CORONARY ANGIOGRAPHY;  Surgeon: Nelva Bush, MD;  Location: [facility];  Service: Cardiovascular;; Single-vessel CAD with CTO of mid RCA.  Mild disease involving the LCA (LAD & LCx w/ multiple OM).  Stable since 2021.  Mild to mod elevated LVEDP.  Normal-mildly reduced CO/CI - 5.2, 3.3 ([place]), 4.6, 2.8 by (TD). RA 7 , RV 32/7, PA 29/20, PCWP 22.   TOTAL HIP ARTHROPLASTY Right 11/11/2014   Procedure: RIGHT TOTAL HIP ARTHROPLASTY ANTERIOR APPROACH;  Surgeon: Mcarthur Rossetti, MD;  Location: [facility];  Service: Orthopedics;   Laterality: Right;   TOTAL HIP ARTHROPLASTY Left 01/29/2015   Procedure: LEFT TOTAL HIP ARTHROPLASTY ANTERIOR APPROACH;  Surgeon: Mcarthur Rossetti, MD;  Location: [facility];  Service: Orthopedics;  Laterality: Left;   TRANSTHORACIC ECHOCARDIOGRAM  08/22/2019   EF 50 to 55%.  GR 1 DD. Mild Inferior Hypokinesis.   TRANSTHORACIC ECHOCARDIOGRAM  09/27/2019   EF 30-35%. Moderately reduced LVEF with Inferior/Inferoseptal Akinesis. Elevated LAP with mild LA dilation - Gr II DD. Mild-Mod AI, no AS.   HPI:  60 y/o with a history of RA, HFrEF, autism, chronis L foot wounds due to venous stasis who was admitted to the hospital on 6/17 for an infected foot ulcer. On 6/22 he developed acute onset of aphasia and R weakness and found to have left acute M1 occlusion. Underwent mechanical thrombectomy. Intubated 6/19 only for surgery for removal of metatarsal head removal and debridement and for 8 hours after thrombectomy.   Assessment / Plan / Recommendation Clinical Impression  Pt's baseline cognitive and communicative abilities are unknown to this therapist. Documentation reports history of autism. He exhibited significant aphasia impacting expression and receptive language. Output consisted of perseverated words and attempts at phrases with reduced intelligibilty. He followed one step commands with 60%, answered biographical/environmental yes/no questions to 30%. He could repeat single words and appeared to have visual disturbance.With assist to initiate he stated 4/7 days of the week. ST will continue to facilitate communication.    SLP Assessment  SLP Recommendation/Assessment: Patient needs continued Speech Lanaguage Pathology Services SLP Visit Diagnosis: Aphasia (R47.01);Cognitive communication deficit (R41.841)    Follow Up Recommendations  Skilled Nursing facility    Frequency and Duration min 2x/week  2 weeks      SLP Evaluation Cognition  Overall Cognitive Status: Impaired/Different from  baseline (suspect) Arousal/Alertness: Awake/alert Orientation Level:  (inaccurate ye/no) Attention: Sustained Sustained Attention: Impaired Awareness: Impaired Awareness Impairment: Emergent impairment (unaware of errors) Problem Solving: Impaired Problem Solving Impairment: Functional basic Safety/Judgment: Impaired       Comprehension  Auditory Comprehension Overall Auditory Comprehension: Impaired Yes/No Questions: Impaired Basic Biographical Questions: 0-25% accurate Commands: Impaired One Step Basic Commands: 25-49% accurate Interfering Components: Visual impairments Visual Recognition/Discrimination Discrimination: Not tested Reading Comprehension Reading Status: Not tested    Expression Expression Primary Mode of Expression: Verbal Verbal Expression Overall Verbal Expression: Impaired Initiation: Impaired Automatic Speech:  (4/7 days of week with help to initiate) Level of Generative/Spontaneous Verbalization: Phrase;Word Repetition:  (repeated words with 100%) Naming: Impairment Confrontation: Impaired (0%) Verbal Errors: Not aware of errors;Perseveration Pragmatics: Impairment Impairments: Dysprosody;Eye contact Written Expression Dominant Hand: Right Written Expression: Not tested   Oral / Motor  Oral Motor/Sensory Function Overall Oral Motor/Sensory Function: Within functional limits Motor Speech Overall Motor Speech: Impaired Respiration: Within functional limits Phonation: Low vocal intensity Resonance:  Within functional limits Articulation: Impaired Level of Impairment: Word Intelligibility: Intelligibility reduced Word: 50-74% accurate Phrase: 25-49% accurate Motor Planning: Witnin functional limits   GO                    Houston Siren 10/15/2020, 3:25 PM

## 2020-10-15 NOTE — Anesthesia Postprocedure Evaluation (Signed)
Anesthesia Post Note  Patient: Lavante Toso  Procedure(s) Performed: IR WITH ANESTHESIA     Patient location during evaluation: NICU Anesthesia Type: General Level of consciousness: sedated and patient remains intubated per anesthesia plan Pain management: pain level controlled Vital Signs Assessment: post-procedure vital signs reviewed and stable Respiratory status: patient remains intubated per anesthesia plan and patient on ventilator - see flowsheet for VS Cardiovascular status: stable Anesthetic complications: no   No notable events documented.  Last Vitals:  Vitals:   10/15/20 0100 10/15/20 0200  BP: 101/62 103/61  Pulse: 79   Resp:    Temp:    SpO2: 100%     Last Pain:  Vitals:   10/15/20 0000  TempSrc: Axillary  PainSc: Asleep                 Cathyrn Deas COKER

## 2020-10-15 NOTE — Progress Notes (Addendum)
  Notified of 15 beat run of Vtach.  No neurological changes.  Remains hemodynamically stable.    Patient with ongoing STE, cards following.  Troponin hs trend has remained flat and TTE unchanged (from prior 09/28/20).  Heparin gtt stopped earlier this morning.  Remains on ASA/ plavix (given before extubated)   P:  Mag 1.8-> 2gm for goal > 2 K 3.8 -> 2 runs of KCL 10 meq for goal > 4 Recheck BMET at 2000 RN will let Cardiology know as well Continue to monitor     Kennieth Rad, ACNP [facility] 10/15/2020, 2:47 PM

## 2020-10-15 NOTE — Consult Note (Addendum)
NAME:  Anees Vanecek, MRN:  [id number], DOB:  10-Jan-1961, LOS: 6 ADMISSION DATE:  10/09/2020, CONSULTATION DATE:  6/23 REFERRING MD:  Dr. Lorrin Goodell, CHIEF COMPLAINT:  L MCA M2 occlusion   History of Present Illness:  Patient is a 60 yo M w/ PMH of autism, RA, HFrEF (30-35%), chronic venous stasis w/ left foot ulcer, recent onychomycosis s/p debridement admitted to [facility] on 6/17 with L foot ulcer and osteomyelitis on Vanc and zosyn; required 4th metatarsal head excision.  On 6/22, code stroke activated for acute aphasia and R sided weakness at 1300. CTH negative. CTA head and neck w/ L MCA M2 occlusion vs severe stenosis. CT perfusion w/ 49 mL penumbra w/ no core. Patient transferred to [facility] for thrombectomy. EKG during transport w/ ST elevations. Cardiology consulted and looke at recent cardiac cath on 09/28/2020 and does not think LAD was occluded; probably from RCA occlusion. Cardiology recommended to proceed with thrombectomy at this time. Mechanical thrombectomy performed w/ complete recanalization. Patient intubated for procedure and arrived to 4N ICU on mechanical ventilation.  PCCM consulted for airway management and medical management.     Pertinent  Medical History   Past Medical History:  Diagnosis Date   Anemia    H/o using iron in the past    Arthritis    rheumatoid   Chronic back pain    Chronic HFrEF (heart failure with reduced ejection fraction) ([place]) 08/2019   Admitted 6 4-7/20/2022 with CHF   Coronary artery disease, occlusive 08/01/2019   RCA CTO with left-to-right collaterals.  Widely patent LCA-large LAD was several small diagonal branches and small diffusely diseased LCx with multiple OM branches.   Hyperlipidemia    Ischemic cardiomyopathy 09/02/2019   Initial EF was 40 to 45%, as of June 2022 EF now 30 to 35%.  Known RCA occlusion.  There is inferior and inferoseptal akinesis on echo.   Joint pain    Joint swelling    Rheumatoid arthritis(714.0)       Significant Hospital Events: Including procedures, antibiotic start and stop dates in addition to other pertinent events   6/17: Admitted [facility] for L foot ulcer w/ osteomyelitis; started on Vanc and Zosyn 6/22: Code Stroke; CTA head and neck w/ L MCA M2 occlusion vs severe stenosis; transferred to [facility] for thrombectomy. Arrived to ICU intubated.  Interim History / Subjective:  Patient intubated on mech vent 40% and +5 Awake with no sedation. BP stable Afebrile  Objective   Blood pressure 112/60, pulse 82, temperature (!) 97.5 F (36.4 C), temperature source Axillary, resp. rate (!) 0, height 5\' 3"  (1.6 m), weight 63 kg, SpO2 100 %.    Vent Mode: PRVC FiO2 (%):  [40 %-100 %] 40 % Set Rate:  [16 bmp-18 bmp] 16 bmp Vt Set:  [460 mL] 460 mL PEEP:  [5 cmH20] 5 cmH20 Plateau Pressure:  [8 cmH20-15 cmH20] 8 cmH20   Intake/Output Summary (Last 24 hours) at 10/15/2020 0443 Last data filed at 10/15/2020 0200 Gross per 24 hour  Intake 1987.03 ml  Output 1575 ml  Net 412.03 ml   Filed Weights   10/10/20 0700 10/11/20 0500 10/14/20 0454  Weight: 58.5 kg 58 kg 63 kg    Examination: General:  Critically ill on mech vent HEENT: MM pink/moist; ETT in place Neuro: Alert; follows commands; muscle strength weaker on right; Pupils equal and reactive; 55mm pupil bilaterally CV: s1s2, no m/r/g PULM:  dim clear bs bilaterally; On mech vent PRVC 40% +5 GI: soft,  bsx4 active  Extremities: warm/dry, L left wrapped Skin: no rashes or lesions   Labs/imaging that I havepersonally reviewed  (right click and "Reselect all SmartList Selections" daily)  ABG shows hyperoxygenation and mild metabolic alkalosis Na 784, K 4.3, Mag 1.8 Glucose 107 Troponin 26; No recent EKG WBC 7, Hgb 8.5 (from 9.2) CXR: ETT at [place] (RT withdrew 2 cm); lungs clear  CTA head and neck 6/22: L MCA M2 occlusion vs severe stenosis. 49 mL penumbra in L MCA territory w/ no core infarct.  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: 6/22 CTA head and neck; 6/22 s/p mech thrombectomy P: -Per Neurology  -Admit to Neuro ICU for frequent neuro checks and telemetry -A1C, glucose monitoring, statin, ASA/Plavix, Echo w/ bubble -BP goal per neurology -MRI brain performed: results pending -PT/OT/SLP consult -limit sedation for frequent neuro exams  Post-Operative Acute Respiratory Failure secondary to Mechanical Thrombectomy P: -Continue mech vent overnight s/p thrombectomy -Plan for SBT in morning -VAP prevention in place  STEMI: noted in route from [facility] to [facility]; Cardiology believes likely RCA occlusion after viewing recent cath on 09/28/2020.  P: -Cards following: appreciate recs -will repeat EKG -Continue IV heparin x48 hours per cards  Chronic L foot ulcer/infection w/ concern for osteomyelitis: Xray and MRI concerning for osteomyelitis. 6/17 s/p 4th metatarsal excision.  P: -Continue Vanc and Zosyn for 10 days(started on 6/17) -PT and OT eval  Abnormal ABI P: -vascular surgery consulted: no need for intervention -continue statins and ASA  Chronic HFrEF: Echo 09/26/2020: EF 30-35%; Mod decreased LV function; grade II diastolic dystfunction. Essential HTN HLD P: -Consider restarting home meds as BP allows per cardiology: (lasix, aldactone, Imdur, metoprolol, and lisinopril) -Fluid restriction and daily weights -continue statins  Rheumatoid Arthritis P: -continue home Arava and prednisone  Anemia of chronic disease P: -Trend CBC  Chronic pain syndrome P: -continue home gabapentin  Best Practice (right click and "Reselect all SmartList Selections" daily)   Diet/type: NPO Pain/Anxiety/Delirium protocol RASS goal: 0 to -1 VAP protocol (if indicated): Yes DVT prophylaxis: systemic heparin GI prophylaxis: N/A Glucose control:  not indicated Central venous access:  N/A Arterial line:  N/A and Yes, and it is still needed Foley:   N/A Mobility:  bed rest  PT consulted: Yes Studies pending: MRI and other EKG, Echo Culture data pending:wound Last reviewed culture data:today Antibiotics:zosyn and vanc Antibiotic de-escalation: no,  continue current rx Stop date: to be determined  Daily labs: ordered Code Status:  full code Last date of multidisciplinary goals of care discussion [pending] ccm prognosis: Serious Disposition: remains critically ill, will stay in intensive care        Labs   CBC: Recent Labs  Lab 10/09/20 1859 10/10/20 0351 10/11/20 0409 10/12/20 0400 10/14/20 0321 10/14/20 2157  WBC 6.0 4.7 9.2 10.1 7.0  --   NEUTROABS 3.6  --   --   --   --   --   HGB 8.8* 9.1* 8.8* 9.2* 8.5* 8.2*  HCT 28.9* 29.7* 28.0* 29.9* 27.5* 24.0*  MCV 81.0 80.7 78.9* 78.7* 78.8*  --   PLT 293 250 272 277 284  --     Basic Metabolic Panel: Recent Labs  Lab 10/09/20 1859 10/10/20 0351 10/11/20 0409 10/12/20 0400 10/14/20 0321 10/14/20 2157  NA 141 141 133* 137 137 139  K 3.7 3.6 3.7 3.6 4.3 3.8  CL 109 110 102 106 108  --  CO2 25 27 25 25 22   --   GLUCOSE 91 106* 110* 93 107*  --   BUN 12 10 8 8 10   --   CREATININE 0.67 0.57* 0.68  0.55* 0.61 0.53*  --   CALCIUM 9.8 9.5 9.2 9.1 9.1  --   MG  --   --  1.7 1.6* 1.8  --   PHOS  --   --  2.4* 1.9*  --   --    GFR: Estimated Creatinine Clearance: 80 mL/min (A) (by C-G formula based on SCr of 0.53 mg/dL (L)). Recent Labs  Lab 10/10/20 0351 10/11/20 0409 10/12/20 0400 10/14/20 0321  WBC 4.7 9.2 10.1 7.0    Liver Function Tests: Recent Labs  Lab 10/10/20 0351 10/11/20 0409 10/12/20 0400  AST 19  --   --   ALT 11  --   --   ALKPHOS 80  --   --   BILITOT 0.4  --   --   PROT 6.5  --   --   ALBUMIN 2.9* 2.9* 2.5*   No results for input(s): LIPASE, AMYLASE in the last 168 hours. No results for input(s): AMMONIA in the last 168 hours.  ABG    Component Value Date/Time   PHART 7.483 (H) 10/14/2020 2157   PCO2ART 37.5 10/14/2020 2157    PO2ART 548 (H) 10/14/2020 2157   HCO3 28.1 (H) 10/14/2020 2157   TCO2 29 10/14/2020 2157   O2SAT 100.0 10/14/2020 2157     Coagulation Profile: No results for input(s): INR, PROTIME in the last 168 hours.  Cardiac Enzymes: No results for input(s): CKTOTAL, CKMB, CKMBINDEX, TROPONINI in the last 168 hours.  HbA1C: Hgb A1c MFr Bld  Date/Time Value Ref Range Status  08/21/2019 05:12 PM 5.7 (H) 4.8 - 5.6 % Final    Comment:    (NOTE) Pre diabetes:          5.7%-6.4% Diabetes:              >6.4% Glycemic control for   <7.0% adults with diabetes     CBG: Recent Labs  Lab 10/14/20 1658  GLUCAP 115*    Review of Systems:   Unable to obtain from patient due to being intubated. Information obtained from chart and bedside nurse.  Past Medical History:  He,  has a past medical history of Anemia, Arthritis, Chronic back pain, Chronic HFrEF (heart failure with reduced ejection fraction) ([place]) (08/2019), Coronary artery disease, occlusive (08/01/2019), Hyperlipidemia, Ischemic cardiomyopathy (09/02/2019), Joint pain, Joint swelling, and Rheumatoid arthritis(714.0).   Surgical History:   Past Surgical History:  Procedure Laterality Date   COLONOSCOPY N/A 04/03/2013   Procedure: COLONOSCOPY;  Surgeon: Irene Shipper, MD;  Location: [facility];  Service: Endoscopy;  Laterality: N/A;   COLONOSCOPY     ENDOVENOUS ABLATION SAPHENOUS VEIN W/ LASER Left 09/26/2019   endovenous laser ablation left greater saphenous vein by Gae Gallop MD    ENDOVENOUS ABLATION SAPHENOUS VEIN W/ LASER Right 10/31/2019   endovenous laser ablation right greater saphenous vein by Gae Gallop MD    ESOPHAGOGASTRODUODENOSCOPY N/A 04/03/2013   Procedure: ESOPHAGOGASTRODUODENOSCOPY (EGD);  Surgeon: Irene Shipper, MD;  Location: [facility];  Service: Endoscopy;  Laterality: N/A;  changed to moderate dr. Henrene Pastor did not want to go to the o.r. for an endo colon-jmt   EYE SURGERY Left    "when i was a very  small boy"   GRAFT APPLICATION Left 70/62/3762   Procedure: GRAFT APPLICATION;  Surgeon: Landis Martins, DPM;  Location: [facility];  Service: Podiatry;  Laterality: Left;  [place] (rep already contacted: Matt Sides)   INCISION AND DRAINAGE OF WOUND Left 10/11/2020   Procedure: IRRIGATION AND DEBRIDEMENT WOUND;  Surgeon: Landis Martins, DPM;  Location: [facility];  Service: Podiatry;  Laterality: Left;   JOINT REPLACEMENT     both knees   KNEE ARTHROPLASTY  06/07/2011   Procedure: COMPUTER ASSISTED TOTAL KNEE ARTHROPLASTY;  Surgeon: Mcarthur Rossetti, MD;  Location: [employer];  Service: Orthopedics;  Laterality: Right;  Right total knee arthoplasty   KNEE CLOSED REDUCTION  07/19/2011   Procedure: CLOSED MANIPULATION KNEE;  Surgeon: Mcarthur Rossetti, MD;  Location: [place];  Service: Orthopedics;  Laterality: Right;  Manipulation under anesthesia right knee   LEFT HEART CATH AND CORONARY ANGIOGRAPHY N/A 08/21/2019   Procedure: LEFT HEART CATH AND CORONARY ANGIOGRAPHY;  Surgeon: Belva Crome, MD;  Location: [place] [facility];  Service: Cardiovascular; Mid to distal RCA CTO with faint left-to-right collaterals.  Widely patent left main.  Mid to distal LCx mild luminal irregularities.  Multiple OM branches.  Large draping LAD with several small diagonal branches.  EF ~40 to 45% with inferior AK.   METATARSAL HEAD EXCISION Left 10/11/2020   Procedure: METATARSAL HEAD EXCISION;  Surgeon: Landis Martins, DPM;  Location: [facility];  Service: Podiatry;  Laterality: Left;  4th metatarsal   RIGHT/LEFT HEART CATH AND CORONARY ANGIOGRAPHY N/A 09/28/2020   Procedure: RIGHT/LEFT HEART CATH AND CORONARY ANGIOGRAPHY;  Surgeon: Nelva Bush, MD;  Location: [place] [facility];  Service: Cardiovascular;; Single-vessel CAD with CTO of mid RCA.  Mild disease involving the LCA (LAD & LCx w/ multiple OM).  Stable since 2021.  Mild to mod elevated LVEDP.  Normal-mildly reduced CO/CI - 5.2, 3.3 ([place]), 4.6, 2.8 by (TD). RA  7 , RV 32/7, PA 29/20, PCWP 22.   TOTAL HIP ARTHROPLASTY Right 11/11/2014   Procedure: RIGHT TOTAL HIP ARTHROPLASTY ANTERIOR APPROACH;  Surgeon: Mcarthur Rossetti, MD;  Location: [place];  Service: Orthopedics;  Laterality: Right;   TOTAL HIP ARTHROPLASTY Left 01/29/2015   Procedure: LEFT TOTAL HIP ARTHROPLASTY ANTERIOR APPROACH;  Surgeon: Mcarthur Rossetti, MD;  Location: [place];  Service: Orthopedics;  Laterality: Left;   TRANSTHORACIC ECHOCARDIOGRAM  08/22/2019   EF 50 to 55%.  GR 1 DD. Mild Inferior Hypokinesis.   TRANSTHORACIC ECHOCARDIOGRAM  09/27/2019   EF 30-35%. Moderately reduced LVEF with Inferior/Inferoseptal Akinesis. Elevated LAP with mild LA dilation - Gr II DD. Mild-Mod AI, no AS.     Social History:   reports that he quit smoking about 16 years ago. His smoking use included cigarettes. He has never used smokeless tobacco. He reports that he does not drink alcohol and does not use drugs.   Family History:  His family history includes Cancer in his brother and mother; Heart failure in his father; Hypertension in his mother. There is no history of Anesthesia problems, Colon cancer, Esophageal cancer, Rectal cancer, or Stomach cancer.   Allergies Allergies  Allergen Reactions   Infliximab Nausea Only and Shortness Of Breath     Home Medications  Prior to Admission medications   Medication Sig Start Date End Date Taking? Authorizing Provider  acetaminophen (TYLENOL) 325 MG tablet Take 2 tablets (650 mg total) by mouth every 4 (four) hours as needed for headache or mild pain. 08/24/19  Yes Allie Bossier, MD  aspirin EC 81 MG tablet Take 1 tablet (81 mg total)  by mouth daily. 08/27/19  Yes Minus Breeding, MD  atorvastatin (LIPITOR) 40 MG tablet Take 40 mg by mouth daily.   Yes [provider]  celecoxib (CELEBREX) 200 MG capsule Take by mouth. 09/14/20 12/13/20 Yes [provider]  cetirizine (ZYRTEC) 10 MG tablet Take 10 mg by mouth daily as needed for  allergies. 07/16/18  Yes [provider]  docusate sodium (COLACE) 100 MG capsule Take 1 capsule (100 mg total) by mouth 2 (two) times daily. Patient taking differently: Take 100 mg by mouth daily as needed for mild constipation. 08/24/19  Yes Allie Bossier, MD  fluticasone [facility]) 50 MCG/ACT nasal spray Place 1 spray into the nose daily as needed for allergies. 07/16/18  Yes [provider]  furosemide (LASIX) 40 MG tablet Take 1 tablet (40 mg total) by mouth daily. 09/30/20  Yes Jennye Boroughs, MD  gabapentin (NEURONTIN) 300 MG capsule Take 300 mg by mouth 2 (two) times daily.  03/24/16  Yes [provider]  isosorbide mononitrate (IMDUR) 60 MG 24 hr tablet Take 1 tablet (60 mg total) by mouth daily. 08/25/19  Yes Allie Bossier, MD  leflunomide (ARAVA) 10 MG tablet Take 1 tablet by mouth daily. 05/20/20  Yes [provider]  lisinopril (ZESTRIL) 5 MG tablet Take 10 mg by mouth daily. 02/13/20 02/12/21 Yes [provider]  metoprolol succinate (TOPROL-XL) 25 MG 24 hr tablet Take 25 mg by mouth daily. 09/09/20 03/08/21 Yes [provider]  montelukast (SINGULAIR) 10 MG tablet Take 10 mg by mouth at bedtime.   Yes [provider]  omeprazole (PRILOSEC) 20 MG capsule Take 20 mg by mouth daily.    Yes [provider]  predniSONE (DELTASONE) 5 MG tablet Take 5 mg by mouth 2 (two) times daily with a meal. 07/03/20  Yes [provider]  spironolactone (ALDACTONE) 25 MG tablet Take 25 mg by mouth daily. 09/09/20  Yes [provider]  triamcinolone (KENALOG) 0.1 % Apply 1 application topically daily as needed (to flaky skin). 05/07/20  Yes [provider]  rivaroxaban (XARELTO) 10 MG TABS tablet Take 10 mg by mouth daily with breakfast. 06/10/11 07/19/11  Mcarthur Rossetti, MD     Critical care time: 45 minutes    JD Rexene Agent [facility] 10/15/2020, 4:44 AM  Please see [email or web address]  for pager details.  From 7A-7P if no response, please call [phone]. After hours, please call ELink [phone].  Plan of care as documented above  Sherrilyn Rist, MD [place] [facility] Pager: See Shea Evans

## 2020-10-15 NOTE — Progress Notes (Signed)
   Notified of 15 beat runs of NSVT. Asymptomatic with this. CCM has already ordered repeat Magnesium and BMET which I agree with. Will continue to monitor for now. Will hold off on restarting Toprol-XL for now given acute stroke yesterday and need for permissive hypertension. However, if patient continues to have long runs of non-sustained VT may need to consider restarting this. Other option may be Amiodarone.  Darreld Mclean, PA-C 10/15/2020 2:04 PM

## 2020-10-15 NOTE — Progress Notes (Signed)
RT note. Patient ETT moved 23 to 21cm per radiology. Patient achieving VT and sat 100% w/ stable VS, RT will continue to monitor.

## 2020-10-16 ENCOUNTER — Ambulatory Visit (HOSPITAL_COMMUNITY): Payer: Medicare Other

## 2020-10-16 LAB — BASIC METABOLIC PANEL
Anion gap: 13 (ref 5–15)
BUN: 5 mg/dL — ABNORMAL LOW (ref 6–20)
CO2: 19 mmol/L — ABNORMAL LOW (ref 22–32)
Calcium: 9.4 mg/dL (ref 8.9–10.3)
Chloride: 105 mmol/L (ref 98–111)
Creatinine, Ser: 0.68 mg/dL (ref 0.61–1.24)
GFR, Estimated: >60 mL/min (ref >60)
Glucose, Bld: 63 mg/dL — ABNORMAL LOW (ref 70–99)
Potassium: 4.8 mmol/L (ref 3.5–5.1)
Sodium: 137 mmol/L (ref 135–145)

## 2020-10-16 LAB — TROPONIN I (HIGH SENSITIVITY)
Troponin I (High Sensitivity): 26 ng/L — ABNORMAL HIGH (ref <18)
Troponin I (High Sensitivity): 30 ng/L — ABNORMAL HIGH (ref <18)

## 2020-10-16 LAB — CBC
HCT: 30.8 % — ABNORMAL LOW (ref 39.0–52.0)
Hemoglobin: 9.7 g/dL — ABNORMAL LOW (ref 13.0–17.0)
MCH: 24.6 pg — ABNORMAL LOW (ref 26.0–34.0)
MCHC: 31.5 g/dL (ref 30.0–36.0)
MCV: 78.2 fL — ABNORMAL LOW (ref 80.0–100.0)
Platelets: 318 10*3/uL (ref 150–400)
RBC: 3.94 MIL/uL — ABNORMAL LOW (ref 4.22–5.81)
RDW: 16.3 % — ABNORMAL HIGH (ref 11.5–15.5)
WBC: 12 10*3/uL — ABNORMAL HIGH (ref 4.0–10.5)
nRBC: 0 % (ref 0.0–0.2)

## 2020-10-16 LAB — MAGNESIUM: Magnesium: 2 mg/dL (ref 1.7–2.4)

## 2020-10-16 IMAGING — DX DG CHEST 1V PORT
1 series · 1 of 1 positions shown · non-contrast
Comparison: August 21, 2019 and April 10, 2019

CLINICAL DATA: Myocardial infarct by electrocardiogram. Weakness.
Nausea.

EXAM:
PORTABLE CHEST 1 VIEW

[chest]
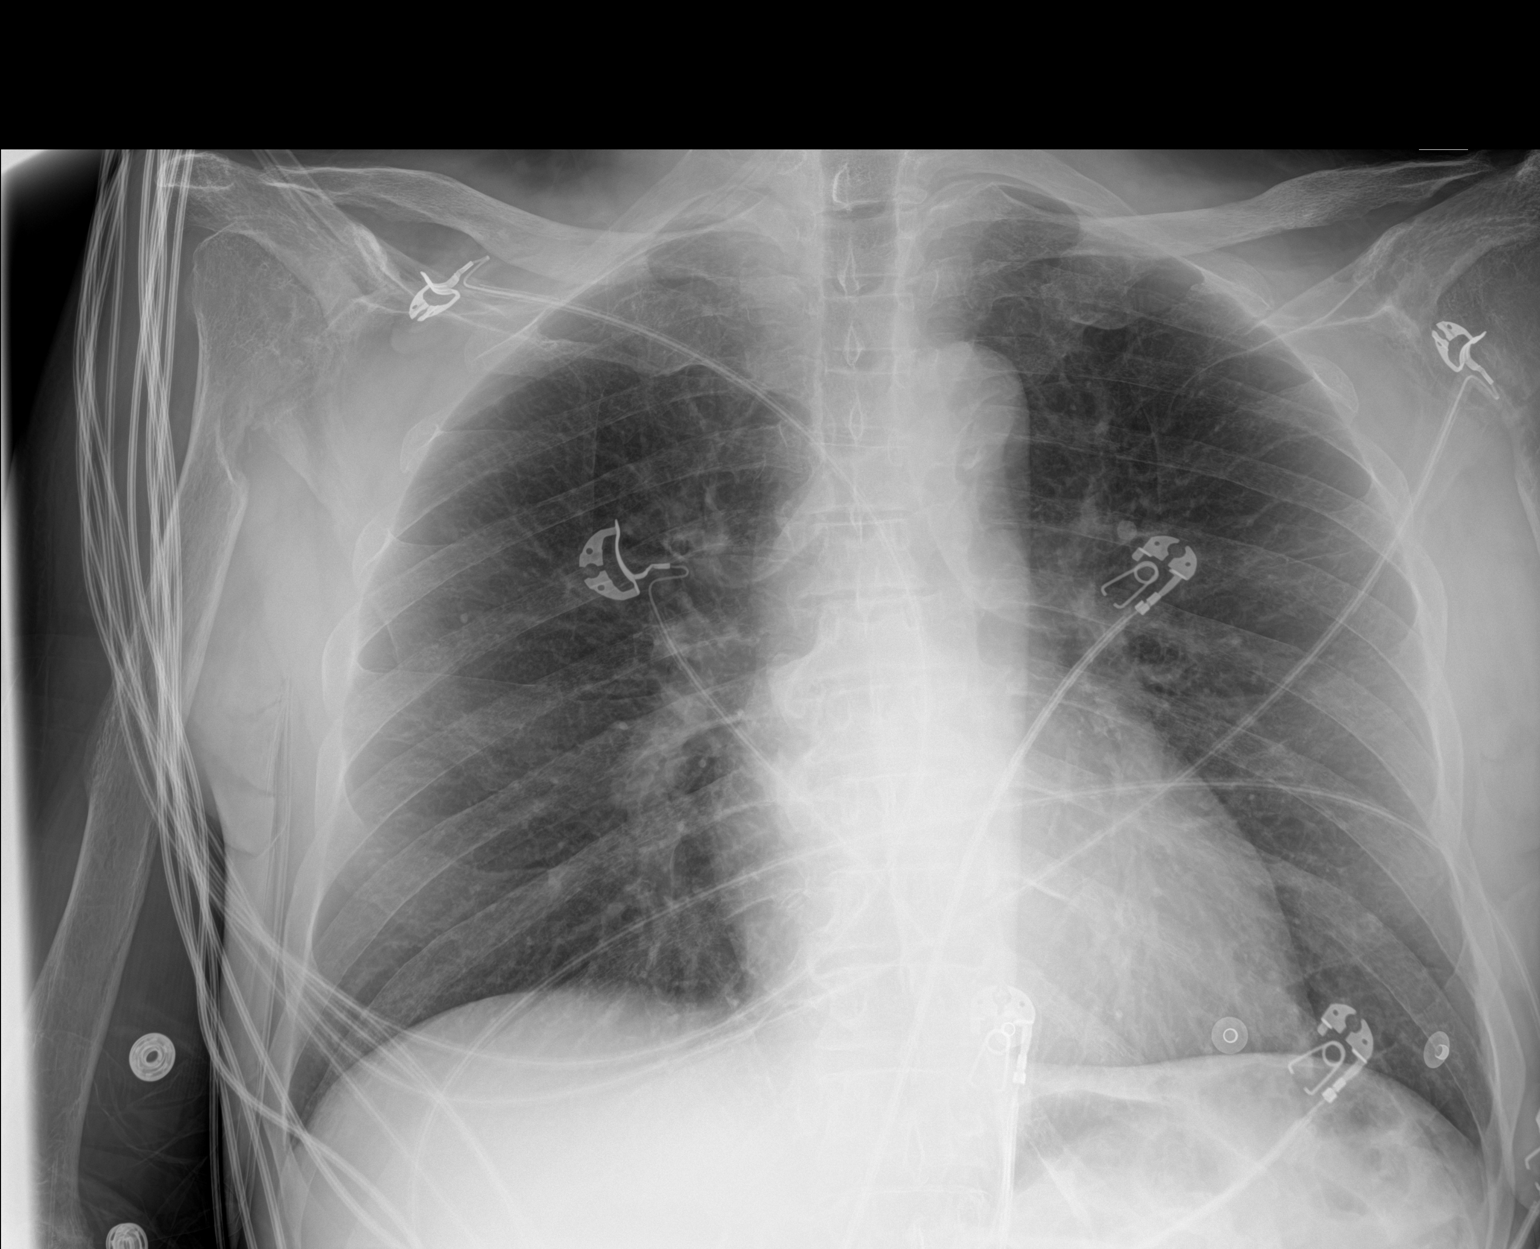

[1 of 1 positions shown; findings below may reference images not displayed]

FINDINGS: The lungs are clear. The heart size and pulmonary vascularity are
normal. No adenopathy. No pneumothorax.

There is evidence of prior trauma involving the proximal right
humerus with bony defect in the medial proximal right humerus.
IMPRESSION: Lungs clear.  Cardiac silhouette normal.

Bony defect in the right proximal humerus, present previously.

## 2020-10-16 MED ORDER — PANTOPRAZOLE SODIUM 40 MG PO TBEC
40.0000 mg | DELAYED_RELEASE_TABLET | Freq: Every day | ORAL | Status: DC
  Administered 2020-10-17 – 2020-10-19 (×3): 40 mg via ORAL
  Filled 2020-10-16 (×3): qty 1

## 2020-10-16 MED ORDER — ATORVASTATIN CALCIUM 80 MG PO TABS
80.0000 mg | ORAL_TABLET | Freq: Every day | ORAL | Status: DC
  Administered 2020-10-17 – 2020-10-19 (×3): 80 mg via ORAL
  Filled 2020-10-16 (×3): qty 1

## 2020-10-16 MED ORDER — ENOXAPARIN SODIUM 40 MG/0.4ML IJ SOSY
40.0000 mg | PREFILLED_SYRINGE | INTRAMUSCULAR | Status: DC
  Administered 2020-10-16 – 2020-10-19 (×4): 40 mg via SUBCUTANEOUS
  Filled 2020-10-16 (×4): qty 0.4

## 2020-10-16 NOTE — Progress Notes (Signed)
Occupational Therapy Progress Note  Pt provided with occlusion lenses with Lt eye nasal field occluded to reduce diplopia.  He was instructed to wear during the day when awake.  He was able to participate in occulomotor exercises.    Recommend CIR   10/16/20 1800  OT Visit Information  Last OT Received On 10/16/20  Assistance Needed +2  History of Present Illness The pt is a 60 yo male originally presenting 6/19 for 4th metatarsal head excision and I&D due to possible osteomyelitis. On 6/22, pt with acute onset R sided weakness and aphasia. Imaging showed L M2 occlusion, s/p thrombectomy on 6/22. Extubated 6/23. 15 beat Vtach on 6/23. PMH includes: RA, CHF, chronic venous stasis with L foot wound, HLD, and chronic anemia.  Precautions  Precautions Fall  Precaution Comments SBP< 140; post op shoe (not in room after transfer from [facility])  Required Braces or Orthoses Other Brace  Other Brace post op shoe  Pain Assessment  Pain Assessment Faces  Faces Pain Scale 4  Pain Location headache  Pain Descriptors / Indicators Aching;Headache;Throbbing  Pain Intervention(s) Limited activity within patient's tolerance  Cognition  Arousal/Alertness Lethargic  Behavior During Therapy Flat affect  Overall Cognitive Status Difficult to assess  Area of Impairment Following commands;Safety/judgement;Awareness;Orientation;Memory  Following Commands Follows one step commands inconsistently;Follows one step commands with increased time  General Comments difficult to assess due to expressive aphasia, pt stating yes to many questions during session, but able to answer a few open-ended questions such as "where is it hurting?" with pt responding "head" the pt follwed commands for LE movement well this session, slightly increased processing.  He was also able to tell me he is at [facility], and it is June.  Difficult to assess due to Impaired communication  Upper Extremity Assessment  Upper Extremity Assessment RUE  deficits/detail;LUE deficits/detail  RUE Deficits / Details Pt with limited ROM bil. 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  Hand to mouth limited to ~60* AAROM.  Rt hand with significant arthritic deformity.  RUE Coordination decreased fine motor;decreased gross motor  LUE Deficits / Details Pt with limited ROM bil. 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  Hand to mouth limited to ~70* AAROM.  Lt hand with arthritic deformity  LUE Coordination decreased gross motor;decreased fine motor  Vision- Assessment  Additional Comments Pt provided with occlusion lenses with nasal portion of Lt lens occluded (pt is Rt eye dominant).  He was instructed in purpose of glasses and does endorse a reduction in diplopia.  A written sign was posted in his room re: wear schedule.  Pt performed occulomotor tracking exercises and worked on sustaining Rt gaze  OT - End of Session  Activity Tolerance Patient tolerated treatment well  Patient left in bed;with call bell/phone within reach;with bed alarm set  Nurse Communication Mobility status  OT Assessment/Plan  OT Plan Discharge plan remains appropriate  OT Visit Diagnosis Cognitive communication deficit (R41.841);Muscle weakness (generalized) (M62.81)  Pain - Right/Left Left  Pain - part of body  (head)  OT Frequency (ACUTE ONLY) Min 2X/week  Recommendations for Other Services Rehab consult  Follow Up Recommendations CIR  OT Equipment None recommended by OT  AM-PAC OT "6 Clicks" Daily Activity Outcome Measure (Version 2)  Help from another person eating meals? 1  Help from another person taking care of personal grooming? 1  Help from another person toileting, which  includes using toliet, bedpan, or urinal? 1  Help from another person bathing (including washing, rinsing, drying)? 1  Help from another person to put on and taking off  regular upper body clothing? 1  Help from another person to put on and taking off regular lower body clothing? 1  6 Click Score 6  OT Goal Progression  Progress towards OT goals Progressing toward goals  OT Time Calculation  OT Start Time (ACUTE ONLY) 1636  OT Stop Time (ACUTE ONLY) 1652  OT Time Calculation (min) 16 min  OT General Charges  $OT Visit 1 Visit  OT Treatments  $Therapeutic Activity 8-22 mins  Nilsa [place]., OTR/L Acute Rehabilitation Services Pager [phone] Office [phone]

## 2020-10-16 NOTE — Progress Notes (Addendum)
Progress Note  Patient Name: Jonathan [name] Date of Encounter: 10/16/2020  [facility] Cardiologist: Minus Breeding, MD   Subjective   Much more awake today but cannot converse due to severe expressive aphasia  Inpatient Medications    Scheduled Meds:  aspirin  81 mg Oral Daily   atorvastatin  40 mg Oral Daily   Chlorhexidine Gluconate Cloth  6 each Topical Daily   clopidogrel  75 mg Oral Daily   gabapentin  300 mg Oral BID   mouth rinse  15 mL Mouth Rinse BID   montelukast  10 mg Oral QHS   pantoprazole (PROTONIX) IV  40 mg Intravenous Q24H   predniSONE  5 mg Oral BID WC   Continuous Infusions:   ceFAZolin (ANCEF) IV Stopped (10/16/20 0554)   doxycycline (VIBRAMYCIN) IV 100 mg (10/16/20 0822)   PRN Meds: acetaminophen **OR** acetaminophen (TYLENOL) oral liquid 160 mg/5 mL **OR** acetaminophen, fluticasone, ondansetron **OR** ondansetron (ZOFRAN) IV, senna-docusate   Vital Signs    Vitals:   10/16/20 0500 10/16/20 0600 10/16/20 0700 10/16/20 0800  BP: (!) 118/53 (!) 125/58 (!) 124/57 110/60  Pulse: 77 81 87 81  Resp: 15 17 (!) 27 15  Temp:    97.7 F (36.5 C)  TempSrc:    Oral  SpO2: 95% 97% 98% 97%  Weight:      Height:        Intake/Output Summary (Last 24 hours) at 10/16/2020 1029 Last data filed at 10/16/2020 0800 Gross per 24 hour  Intake 568.74 ml  Output 1500 ml  Net -931.26 ml   Last 3 Weights 10/16/2020 10/15/2020 10/14/2020  Weight (lbs) 140 lb 3.4 oz 138 lb 14.2 oz 138 lb 14.2 oz  Weight (kg) 63.6 kg 63 kg 63 kg      Telemetry    NSR with PACs, PVCs.  No PAF- Personally Reviewed  ECG    SR, diffuse ST elevation, PVC- Personally Reviewed  Physical Exam   GEN: No acute distress.   Neck: No JVD Cardiac: RRR, no murmurs, rubs, or gallops.  Respiratory: Clear to auscultation bilaterally. GI: Soft, nontender, non-distended  MS: No edema; No deformity. Neuro:  right facial droop and right hemiparesis Psych: cannot assess  Labs     High Sensitivity Troponin:   Recent Labs  Lab 10/14/20 2041 10/14/20 2303 10/15/20 0036 10/15/20 0252 10/15/20 0830  TROPONINIHS 26* 25* 25* 25* 25*      Chemistry Recent Labs  Lab 10/10/20 0351 10/11/20 0409 10/12/20 0400 10/14/20 0321 10/15/20 0500 10/15/20 2104 10/16/20 0557  NA 141 133* 137   < > 139 133* 137  K 3.6 3.7 3.6   < > 3.8 4.6 4.8  CL 110 102 106   < > 109 103 105  CO2 27 25 25    < > 20* 13* 19*  GLUCOSE 106* 110* 93   < > 88 65* 63*  BUN 10 8 8    < > 7 5* 5*  CREATININE 0.57* 0.68  0.55* 0.61   < > 0.62 0.67 0.68  CALCIUM 9.5 9.2 9.1   < > 9.2 9.2 9.4  PROT 6.5  --   --   --  6.1*  --   --   ALBUMIN 2.9* 2.9* 2.5*  --  2.4*  --   --   AST 19  --   --   --  30  --   --   ALT 11  --   --   --  21  --   --   ALKPHOS 80  --   --   --  69  --   --   BILITOT 0.4  --   --   --  0.8  --   --   GFRNONAA >60 >60  >60 >60   < > >60 >60 >60  ANIONGAP 4* 6 6   < > 10 17* 13   < > = values in this interval not displayed.     Hematology Recent Labs  Lab 10/14/20 0321 10/14/20 2157 10/15/20 0500 10/16/20 0557  WBC 7.0  --  6.1 12.0*  RBC 3.49*  --  3.72* 3.94*  HGB 8.5* 8.2* 9.1* 9.7*  HCT 27.5* 24.0* 29.4* 30.8*  MCV 78.8*  --  79.0* 78.2*  MCH 24.4*  --  24.5* 24.6*  MCHC 30.9  --  31.0 31.5  RDW 16.0*  --  16.1* 16.3*  PLT 284  --  312 318    Radiology    DG Abd 1 View  Result Date: 10/14/2020 CLINICAL DATA:  Orogastric tube placement. EXAM: ABDOMEN - 1 VIEW COMPARISON:  None. FINDINGS: Tip and side port of the enteric tube below the diaphragm in the stomach. Mild gaseous gastric distension. There is excreted IV contrast within the renal collecting systems. Right upper quadrant surgical clips typical of cholecystectomy. IMPRESSION: Tip and side port of the enteric tube below the diaphragm in the stomach. Electronically Signed   By: Keith Rake M.D.   On: 10/14/2020 21:51   CT HEAD WO CONTRAST  Result Date: 10/15/2020 CLINICAL DATA:  Stroke  follow-up. Postop percutaneous clot retrieval. EXAM: CT HEAD WITHOUT CONTRAST TECHNIQUE: Contiguous axial images were obtained from the base of the skull through the vertex without intravenous contrast. COMPARISON:  MRI head 10/15/2020.  CT head 10/14/2020 FINDINGS: Brain: Hypodensity in the left basal ganglia compatible with acute infarct. This involves primarily the head of the caudate and putamen. Small amount of hemorrhage in the putamen is best seen on gradient echo imaging. Small acute infarct also in the left temporal lobe. Infarct distribution similar to the recent MRI. Diffusion-weighted imaging also demonstrated small areas of infarct in the left temporoparietal lobe. No large volume hemorrhage. Ventricle size normal. Mild edema in the head of the caudate with mass-effect on the left frontal horn. No midline shift. Vascular: Negative for hyperdense vessel Skull: Negative Sinuses/Orbits: Mucosal edema and air-fluid levels in the paranasal sinuses. Patient is intubated. 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 Minimal petechial hemorrhage in the left putamen as noted on gradient echo imaging. No large area of hemorrhage. Electronically Signed   By: Franchot Gallo M.D.   On: 10/15/2020 10:22   MR BRAIN WO CONTRAST  Result Date: 10/15/2020 CLINICAL DATA:  Stroke follow-up EXAM: MRI HEAD WITHOUT CONTRAST TECHNIQUE: Multiplanar, multiecho pulse sequences of the brain and surrounding structures were obtained without intravenous contrast. COMPARISON:  Head CT from yesterday FINDINGS: Brain: Confluent restricted diffusion in the left stratum and in the left MCA inferior division cortex and white matter. Petechial hemorrhage is seen at the level of the affected basal ganglia. No hydrocephalus, mass, or collection. Vascular: Preserved flow voids. Skull and upper cervical spine: Normal marrow signal Sinuses/Orbits: Nasopharyngeal fluid in the  setting of intubation. Negative orbits. Other: Bilateral parotid ductal dilatation without superimposed acute inflammation. Right mastoid opacification. These findings are considered incidental to the presentation. IMPRESSION: Acute infarction in the left  MCA territory affecting lenticulostriates and inferior division cortex/white matter. Mild petechial hemorrhage at the level of the left basal ganglia. Electronically Signed   By: Monte Fantasia M.D.   On: 10/15/2020 08:45   IR CT Head Ltd  Result Date: 10/15/2020 INDICATION: 60 year old male with past medical history significant for autism spectrum disorder, RA, systolic CHF, chronic venous stasis with chronic left foot ulcer and recent onychomycosis s/p debridement who was admitted to [facility] with left foot ulcer and osteomyelitis on Vanc and zosyn. He was found to have acute onset of aphasia and right-sided weakness. His last known well was 1 p.m. on 10/14/2020. Head CT showed no evidence of large acute territorial infarct or hemorrhage. CT angiogram of the head and neck showed a proximal left M2/MCA occlusion. CT perfusion showed a 49 mL penumbra without evidence of a core infarct. NIHSS at presentation was 26; baseline modified Rankin scale is 4 related to autism. No IV tPA administered as she was outside the window. He was transferred to [facility] for mechanical thrombectomy. EKG during transport was notable for ST Elevations. Discussed with Dr. Ellyn Hack from cardiology who believes the EKG changes are related to neurological process. Decision was made to proceed with mechanical thrombectomy. EXAM: ULTRASOUND-GUIDED VASCULAR ACCESS DIAGNOSTIC CEREBRAL ANGIOGRAM FLAT PANEL HEAD CT COMPARISON:  CT/CT angiogram of the head and neck October 14, 2020. MEDICATIONS: Not applicable. ANESTHESIA/SEDATION: The procedure was performed under general anesthesia. CONTRAST:  35 mL of Omnipaque 240 milligram/mL FLUOROSCOPY TIME:  Fluoroscopy Time: 24 minutes 42  seconds (541 mGy). COMPLICATIONS: None immediate. TECHNIQUE: Informed written consent was obtained from the patient after a thorough discussion of the procedural risks, benefits and alternatives. All questions were addressed. Maximal Sterile Barrier Technique was utilized including caps, mask, sterile gowns, sterile gloves, sterile drape, hand hygiene and skin antiseptic. A timeout was performed prior to the initiation of the procedure. The right groin was prepped and draped in the usual sterile fashion. Using a micropuncture kit and the modified Seldinger technique, access was gained to the right common femoral artery and an 8 French sheath was placed. Real-time ultrasound guidance was utilized for vascular access including the acquisition of a permanent ultrasound image documenting patency of the accessed vessel. Under fluoroscopy, a Zoom 88 guide catheter was navigated over a 6 [place] Berenstein 2 catheter and a 0.035" Terumo Glidewire into the aortic arch. The catheter was placed into the left common carotid artery and then advanced into the left internal carotid artery. The inner catheter was removed. Frontal and lateral angiograms of the head were obtained. FINDINGS: 1. Patent right common femoral artery with adequate caliber for vascular access. 2. Severe tortuosity of the cervical left ICA and left carotid siphon. 3. Occlusion of the proximal left M2/MCA posterior division branch. PROCEDURE: Under biplane roadmap, a zoom 55 aspiration catheter was navigated over a phenom 21 microcatheter and a Aristotle 14 microguidewire into the cavernous segment of the left ICA. The microcatheter was then navigated over the wire into the left M3/MCA posterior division branch. Then, a 4 x 40 mm solitaire stent retriever was deployed spanning the M2 segment. The device was allowed to intercalated with the clot for 4 minutes. The microcatheter was removed. The aspiration catheter was advanced to the level of occlusion and  connected to a penumbra aspiration pump. The thrombectomy device and aspiration catheter were removed under constant aspiration. Left internal carotid artery angiograms with frontal and lateral views of the head showed complete recanalization of the left  MCA vascular tree. Flat panel CT of the head was obtained and post processed in a separate workstation with concurrent attending physician supervision. Selected images were sent to PACS. No evidence of hemorrhagic complication. A right common femoral artery angiogram in right anterior oblique view was obtained via sheath side port. Atherosclerotic changes of the external iliac and right common femoral artery without hemodynamically significant stenosis. The vessel has adequate caliber for closure device utilization. The femoral sheath was exchanged over the wire for an 8 [place] Angio-Seal which was utilized for access closure. Immediate hemostasis was achieved. IMPRESSION: Successful mechanical thrombectomy performed for treatment of a proximal left M2/MCA posterior division branch occlusion. One pass performed with combined aspiration and stent retriever with complete recanalization (TICI 3). No thromboembolic or hemorrhagic complication. PLAN: Transferred to ICU for continued care. Electronically Signed   By: Pedro Earls M.D.   On: 10/15/2020 13:52   IR US Guide Vasc Access Right  Result Date: 10/15/2020 INDICATION: 60 year old male with past medical history significant for autism spectrum disorder, RA, systolic CHF, chronic venous stasis with chronic left foot ulcer and recent onychomycosis s/p debridement who was admitted to [facility] with left foot ulcer and osteomyelitis on Vanc and zosyn. He was found to have acute onset of aphasia and right-sided weakness. His last known well was 1 p.m. on 10/14/2020. Head CT showed no evidence of large acute territorial infarct or hemorrhage. CT angiogram of the head and neck showed a proximal  left M2/MCA occlusion. CT perfusion showed a 49 mL penumbra without evidence of a core infarct. NIHSS at presentation was 26; baseline modified Rankin scale is 4 related to autism. No IV tPA administered as she was outside the window. He was transferred to [facility] for mechanical thrombectomy. EKG during transport was notable for ST Elevations. Discussed with Dr. Ellyn Hack from cardiology who believes the EKG changes are related to neurological process. Decision was made to proceed with mechanical thrombectomy. EXAM: ULTRASOUND-GUIDED VASCULAR ACCESS DIAGNOSTIC CEREBRAL ANGIOGRAM FLAT PANEL HEAD CT COMPARISON:  CT/CT angiogram of the head and neck October 14, 2020. MEDICATIONS: Not applicable. ANESTHESIA/SEDATION: The procedure was performed under general anesthesia. CONTRAST:  35 mL of Omnipaque 240 milligram/mL FLUOROSCOPY TIME:  Fluoroscopy Time: 24 minutes 42 seconds (541 mGy). COMPLICATIONS: None immediate. TECHNIQUE: Informed written consent was obtained from the patient after a thorough discussion of the procedural risks, benefits and alternatives. All questions were addressed. Maximal Sterile Barrier Technique was utilized including caps, mask, sterile gowns, sterile gloves, sterile drape, hand hygiene and skin antiseptic. A timeout was performed prior to the initiation of the procedure. The right groin was prepped and draped in the usual sterile fashion. Using a micropuncture kit and the modified Seldinger technique, access was gained to the right common femoral artery and an 8 French sheath was placed. Real-time ultrasound guidance was utilized for vascular access including the acquisition of a permanent ultrasound image documenting patency of the accessed vessel. Under fluoroscopy, a Zoom 88 guide catheter was navigated over a 6 [place] Berenstein 2 catheter and a 0.035" Terumo Glidewire into the aortic arch. The catheter was placed into the left common carotid artery and then advanced into the left internal  carotid artery. The inner catheter was removed. Frontal and lateral angiograms of the head were obtained. FINDINGS: 1. Patent right common femoral artery with adequate caliber for vascular access. 2. Severe tortuosity of the cervical left ICA and left carotid siphon. 3. Occlusion of the proximal left M2/MCA posterior  division branch. PROCEDURE: Under biplane roadmap, a zoom 55 aspiration catheter was navigated over a phenom 21 microcatheter and a Aristotle 14 microguidewire into the cavernous segment of the left ICA. The microcatheter was then navigated over the wire into the left M3/MCA posterior division branch. Then, a 4 x 40 mm solitaire stent retriever was deployed spanning the M2 segment. The device was allowed to intercalated with the clot for 4 minutes. The microcatheter was removed. The aspiration catheter was advanced to the level of occlusion and connected to a penumbra aspiration pump. The thrombectomy device and aspiration catheter were removed under constant aspiration. Left internal carotid artery angiograms with frontal and lateral views of the head showed complete recanalization of the left MCA vascular tree. Flat panel CT of the head was obtained and post processed in a separate workstation with concurrent attending physician supervision. Selected images were sent to PACS. No evidence of hemorrhagic complication. A right common femoral artery angiogram in right anterior oblique view was obtained via sheath side port. Atherosclerotic changes of the external iliac and right common femoral artery without hemodynamically significant stenosis. The vessel has adequate caliber for closure device utilization. The femoral sheath was exchanged over the wire for an 8 [place] Angio-Seal which was utilized for access closure. Immediate hemostasis was achieved. IMPRESSION: Successful mechanical thrombectomy performed for treatment of a proximal left M2/MCA posterior division branch occlusion. One pass performed  with combined aspiration and stent retriever with complete recanalization (TICI 3). No thromboembolic or hemorrhagic complication. PLAN: Transferred to ICU for continued care. Electronically Signed   By: Pedro Earls M.D.   On: 10/15/2020 13:52   DG CHEST PORT 1 VIEW  Result Date: 10/15/2020 CLINICAL DATA:  Respiratory failure EXAM: PORTABLE CHEST 1 VIEW COMPARISON:  09/26/2020 FINDINGS: The endotracheal tube is at the carina. Nasogastric tube extends into the upper abdomen beyond the margin of the examination. Lungs are clear. No pneumothorax or pleural effusion. Cardiac size within normal limits. No acute bone abnormality. IMPRESSION: Endotracheal tube at the carina. Withdrawal by roughly 2 cm would better position the catheter within the distal trachea. Electronically Signed   By: Fidela Salisbury MD   On: 10/15/2020 01:55   IR PERCUTANEOUS ART THROMBECTOMY/INFUSION INTRACRANIAL INC DIAG ANGIO  Result Date: 10/15/2020 INDICATION: 60 year old male with past medical history significant for autism spectrum disorder, RA, systolic CHF, chronic venous stasis with chronic left foot ulcer and recent onychomycosis s/p debridement who was admitted to [facility] with left foot ulcer and osteomyelitis on Vanc and zosyn. He was found to have acute onset of aphasia and right-sided weakness. His last known well was 1 p.m. on 10/14/2020. Head CT showed no evidence of large acute territorial infarct or hemorrhage. CT angiogram of the head and neck showed a proximal left M2/MCA occlusion. CT perfusion showed a 49 mL penumbra without evidence of a core infarct. NIHSS at presentation was 26; baseline modified Rankin scale is 4 related to autism. No IV tPA administered as she was outside the window. He was transferred to [facility] for mechanical thrombectomy. EKG during transport was notable for ST Elevations. Discussed with Dr. Ellyn Hack from cardiology who believes the EKG changes are related to  neurological process. Decision was made to proceed with mechanical thrombectomy. EXAM: ULTRASOUND-GUIDED VASCULAR ACCESS DIAGNOSTIC CEREBRAL ANGIOGRAM FLAT PANEL HEAD CT COMPARISON:  CT/CT angiogram of the head and neck October 14, 2020. MEDICATIONS: Not applicable. ANESTHESIA/SEDATION: The procedure was performed under general anesthesia. CONTRAST:  35 mL of  Omnipaque 240 milligram/mL FLUOROSCOPY TIME:  Fluoroscopy Time: 24 minutes 42 seconds (541 mGy). COMPLICATIONS: None immediate. TECHNIQUE: Informed written consent was obtained from the patient after a thorough discussion of the procedural risks, benefits and alternatives. All questions were addressed. Maximal Sterile Barrier Technique was utilized including caps, mask, sterile gowns, sterile gloves, sterile drape, hand hygiene and skin antiseptic. A timeout was performed prior to the initiation of the procedure. The right groin was prepped and draped in the usual sterile fashion. Using a micropuncture kit and the modified Seldinger technique, access was gained to the right common femoral artery and an 8 French sheath was placed. Real-time ultrasound guidance was utilized for vascular access including the acquisition of a permanent ultrasound image documenting patency of the accessed vessel. Under fluoroscopy, a Zoom 88 guide catheter was navigated over a 6 [place] Berenstein 2 catheter and a 0.035" Terumo Glidewire into the aortic arch. The catheter was placed into the left common carotid artery and then advanced into the left internal carotid artery. The inner catheter was removed. Frontal and lateral angiograms of the head were obtained. FINDINGS: 1. Patent right common femoral artery with adequate caliber for vascular access. 2. Severe tortuosity of the cervical left ICA and left carotid siphon. 3. Occlusion of the proximal left M2/MCA posterior division branch. PROCEDURE: Under biplane roadmap, a zoom 55 aspiration catheter was navigated over a phenom 21  microcatheter and a Aristotle 14 microguidewire into the cavernous segment of the left ICA. The microcatheter was then navigated over the wire into the left M3/MCA posterior division branch. Then, a 4 x 40 mm solitaire stent retriever was deployed spanning the M2 segment. The device was allowed to intercalated with the clot for 4 minutes. The microcatheter was removed. The aspiration catheter was advanced to the level of occlusion and connected to a penumbra aspiration pump. The thrombectomy device and aspiration catheter were removed under constant aspiration. Left internal carotid artery angiograms with frontal and lateral views of the head showed complete recanalization of the left MCA vascular tree. Flat panel CT of the head was obtained and post processed in a separate workstation with concurrent attending physician supervision. Selected images were sent to PACS. No evidence of hemorrhagic complication. A right common femoral artery angiogram in right anterior oblique view was obtained via sheath side port. Atherosclerotic changes of the external iliac and right common femoral artery without hemodynamically significant stenosis. The vessel has adequate caliber for closure device utilization. The femoral sheath was exchanged over the wire for an 8 [place] Angio-Seal which was utilized for access closure. Immediate hemostasis was achieved. IMPRESSION: Successful mechanical thrombectomy performed for treatment of a proximal left M2/MCA posterior division branch occlusion. One pass performed with combined aspiration and stent retriever with complete recanalization (TICI 3). No thromboembolic or hemorrhagic complication. PLAN: Transferred to ICU for continued care. Electronically Signed   By: Pedro Earls M.D.   On: 10/15/2020 13:52   CT HEAD CODE STROKE WO CONTRAST`  Result Date: 10/14/2020 CLINICAL DATA:  Code stroke. Right facial droop. Right-sided weakness. EXAM: CT HEAD WITHOUT CONTRAST  TECHNIQUE: Contiguous axial images were obtained from the base of the skull through the vertex without intravenous contrast. COMPARISON:  None. FINDINGS: Brain: Abnormal edema and loss of gray-white differentiation in the left basal ganglia (including the caudate and lentiform nucleus), concerning for acute or subacute infarct. Mild associated mass effect with approximately 2 mm of rightward midline shift at the foramen of Monro. Vascular: No hyperdense vessel identified.  Calcific atherosclerosis. Skull: No acute fracture. Sinuses/Orbits: Clear visualized sinuses.  Unremarkable orbits. Other: Small right mastoid effusion. ASPECTS [facility] Stroke Program Early CT Score) - Ganglionic level infarction (caudate, lentiform nuclei, internal capsule, insula, M1-M3 cortex): 5 - Supraganglionic infarction (M4-M6 cortex): 3 Total score (0-10 with 10 being normal): 8 IMPRESSION: 1. Abnormal edema and loss of gray-white differentiation in the left basal ganglia (including the caudate and lentiform nucleus), concerning for acute or subacute infarct. Recommend MRI to confirm and exclude other etiologies. 2. Mild associated mass effect with approximately 2 mm of rightward midline shift at the foramen of Monro. 3. No acute hemorrhage. Findings discussed with Dr. Dwyane Dee via telephone at 5:18 p.m. Electronically Signed   By: Margaretha Sheffield MD   On: 10/14/2020 17:22   ECHOCARDIOGRAM LIMITED  Result Date: 10/15/2020    ECHOCARDIOGRAM LIMITED REPORT   Patient Name:   ZYRELL CARMEAN Date of Exam: 10/15/2020 Medical Rec #:  [id number]        Height:       63.0 in Accession #:    [id number]       Weight:       138.9 lb Date of Birth:  [birth year]       BSA:          1.656 m Patient Age:    19 years         BP:           130/61 mmHg Patient Gender: M                HR:           78 bpm. Exam Location:  Inpatient Procedure: Limited Echo, Limited Color Doppler and Cardiac Doppler Indications:    stroke. coronary artery disease.   History:        Patient has prior history of Echocardiogram examinations, most                 recent 09/26/2020. CHF, CAD; Risk Factors:Dyslipidemia.  Sonographer:    Johny Chess Referring Phys: [address] [place]  Sonographer Comments: Echo performed with patient supine and on artificial respirator. IMPRESSIONS  1. Akinesis of the inferior and inferolateral walls; remaining walls hypokinetic; overall moderate to severe LV dysfunction.  2. Left ventricular ejection fraction, by estimation, is 30 to 35%. The left ventricle has moderate to severely decreased function. The left ventricle demonstrates regional wall motion abnormalities (see scoring diagram/findings for description). The left ventricular internal cavity size was mildly dilated. Left ventricular diastolic parameters are consistent with Grade II diastolic dysfunction (pseudonormalization). Elevated left atrial pressure.  3. Right ventricular systolic function is normal. The right ventricular size is normal.  4. The mitral valve is grossly normal. Trivial mitral valve regurgitation.  5. The aortic valve is tricuspid. Aortic valve regurgitation is moderate. Mild aortic valve sclerosis is present, with no evidence of aortic valve stenosis. FINDINGS  Left Ventricle: Left ventricular ejection fraction, by estimation, is 30 to 35%. The left ventricle has moderate to severely decreased function. The left ventricle demonstrates regional wall motion abnormalities. The left ventricular internal cavity size was mildly dilated. There is no left ventricular hypertrophy. Left ventricular diastolic parameters are consistent with Grade II diastolic dysfunction (pseudonormalization). Elevated left atrial pressure. Right Ventricle: The right ventricular size is normal. Right ventricular systolic function is normal. Left Atrium: Left atrial size was normal in size. Right Atrium: Right atrial size was normal in size. Pericardium: There is no evidence of pericardial  effusion.  Mitral Valve: The mitral valve is grossly normal. There is mild thickening of the mitral valve leaflet(s). Trivial mitral valve regurgitation. Tricuspid Valve: The tricuspid valve is normal in structure. Tricuspid valve regurgitation is trivial. Aortic Valve: The aortic valve is tricuspid. Aortic valve regurgitation is moderate. Aortic regurgitation PHT measures 260 msec. Mild aortic valve sclerosis is present, with no evidence of aortic valve stenosis. Pulmonic Valve: The pulmonic valve was grossly normal. Pulmonic valve regurgitation is trivial. IAS/Shunts: The interatrial septum was not well visualized. Additional Comments: Akinesis of the inferior and inferolateral walls; remaining walls hypokinetic; overall moderate to severe LV dysfunction. LEFT VENTRICLE PLAX 2D LVIDd:         5.40 cm  Diastology LVIDs:         5.00 cm  LV e' medial:    5.00 cm/s LV PW:         1.00 cm  LV E/e' medial:  26.2 LV IVS:        0.90 cm  LV e' lateral:   7.18 cm/s LVOT diam:     2.30 cm  LV E/e' lateral: 18.2 LV SV:         93 LV SV Index:   56 LVOT Area:     4.15 cm  IVC IVC diam: 1.90 cm LEFT ATRIUM         Index LA diam:    3.30 cm 1.99 cm/m  AORTIC VALVE LVOT Vmax:   125.00 cm/s LVOT Vmean:  78.400 cm/s LVOT VTI:    0.223 m AI PHT:      260 msec  AORTA Ao Root diam: 3.20 cm Ao Asc diam:  3.00 cm MITRAL VALVE MV Area (PHT): 4.60 cm     SHUNTS MV Decel Time: 165 msec     Systemic VTI:  0.22 m MV E velocity: 131.00 cm/s  Systemic Diam: 2.30 cm Kirk Ruths MD Electronically signed by Kirk Ruths MD Signature Date/Time: 10/15/2020/9:20:08 AM    Final    CT ANGIO HEAD NECK W WO CM W PERF (CODE STROKE)  Result Date: 10/14/2020 CLINICAL DATA:  Neuro deficit, acute stroke suspected. EXAM: CT ANGIOGRAPHY HEAD AND NECK CT PERFUSION BRAIN TECHNIQUE: Multidetector CT imaging of the head and neck was performed using the standard protocol during bolus administration of intravenous contrast. Multiplanar CT image  reconstructions and MIPs were obtained to evaluate the vascular anatomy. Carotid stenosis measurements (when applicable) are obtained utilizing NASCET criteria, using the distal internal carotid diameter as the denominator. Multiphase CT imaging of the brain was performed following IV bolus contrast injection. Subsequent parametric perfusion maps were calculated using RAPID software. CONTRAST:  117m OMNIPAQUE IOHEXOL 350 MG/ML SOLN COMPARISON:  None. FINDINGS: CTA NECK FINDINGS Aortic arch: Great vessel origins are patent. Mild narrowing of the left subclavian artery origin. Right carotid system: No evidence of dissection, stenosis (50% or greater) or occlusion. Tortuous ICA with retropharyngeal course. Mild atherosclerosis at the carotid bifurcation. Left carotid system: No evidence of dissection, stenosis (50% or greater) or occlusion. Mild atherosclerosis at the carotid bifurcation. Vertebral arteries: Right dominant. The non dominant left vertebral artery is small throughout its course with small transverse foramen. Right vertebral artery is patent without significant (greater than 50%) stenosis. Skeleton: Other neck: Enlarged bilateral parotid ducts, left greater than right without definite radiodense calculus, although evaluation is limited on the CTA. Upper chest: Layering bilateral pleural effusions in the visualized lung apices with overlying ground-glass opacities Review of the MIP images confirms the above findings CTA  HEAD FINDINGS Anterior circulation: Bilateral ICAs are patent with calcific atherosclerosis, but no significant (greater than 50%) stenosis. Left M1 MCAs patent. Occlusion versus severe stenosis of a proximal left M2 MCA branch (see series 8, images 121 through 123; series 10, images 120 5 through 128) with irregular distal opacification. Right MCA and bilateral ACAs are patent. No aneurysm identified. Posterior circulation: Bilateral intradural vertebral arteries and the basilar artery  are patent. Bilateral fetal type PCAs without evidence of proximal hemodynamically significant PCA stenosis. Limited evaluation of the distal PCAs due to venous contamination. No aneurysm identified. Venous sinuses: As permitted by contrast timing, patent. Anatomic variants: See above. Review of the MIP images confirms the above findings CT Brain Perfusion Findings: ASPECTS: 8 CBF (<30%) Volume: 59m Perfusion (Tmax>6.0s) volume: 476mMismatch Volume: 4948mThe amount of penumbra may be underestimated given the more superior left MCA territory is not imaged on perfusion Infarction Location:None identified IMPRESSION: 1. 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 Associated 49 mL of penumbra in the left MCA territory. The volume of penumbra may be underestimated given the more superior left MCA territory is not imaged on perfusion. No evidence of core infarct on RAPID analysis. 2. Layering bilateral pleural effusions in the visualized lung apices with overlying ground-glass opacities, potentially atelectasis. Recommend dedicated chest imaging to further characterize. 3. Significantly enlarged bilateral parotid ducts, left greater than right without definite radiodense calculus, although evaluation is limited on this CTA. Bilateral overlying subcutaneous stranding could represent anasarca, although parotiditis is not excluded. Recommend clinical correlation and consideration of follow-up CT neck (preferably with contrast) after resolution of the patient's emergent issues. Critical findings discussed with Dr. StaQuinn Axe 5:44 p.m. via telephone. Electronically Signed   By: FreMargaretha Sheffield   On: 10/14/2020 18:14    Cardiac Studies   Echo 10/15/20 1. Akinesis of the inferior and inferolateral walls; remaining walls  hypokinetic; overall moderate to severe LV dysfunction.   2. Left ventricular ejection fraction, by estimation, is 30  to 35%. The  left ventricle has moderate to severely decreased function. The left  ventricle demonstrates regional wall motion abnormalities (see scoring  diagram/findings for description). The  left ventricular internal cavity size was mildly dilated. Left ventricular  diastolic parameters are consistent with Grade II diastolic dysfunction  (pseudonormalization). Elevated left atrial pressure.   3. Right ventricular systolic function is normal. The right ventricular  size is normal.   4. The mitral valve is grossly normal. Trivial mitral valve  regurgitation.   5. The aortic valve is tricuspid. Aortic valve regurgitation is moderate.  Mild aortic valve sclerosis is present, with no evidence of aortic valve  stenosis.   RIGHT/LEFT HEART CATH AND CORONARY ANGIOGRAPHY 09/28/20 Severe single-vessel coronary artery disease with chronic total occlusion of the mid RCA.  There is mild disease involving the left coronary artery.  Overall appearance is similar to 2019. Mildly to moderately elevated left heart filling pressure. Normal cardiac output/index.   Recommendations: Continue medical therapy of acute on chronic HFrEF and ischemic heart disease. [facility] post-[facility] recovery for ongoing medical management.   Patient Profile     59 20[age]. male  with a history of CAD with known CTO of RCA on recent cath on 09/28/2020, ischemic cardiomyopathy/ chronic combined CHF with EF of 30-35%, hyperlipidemia, chronic venous insufficiency s/p bilateral GSV laser ablations, non-healing lower extremity wounds, rheumatoid arthritis, and autism. Patient was  admitted on 10/09/2020 with suspected osteomyelitis related to onychomycosis and worsening chronic left foot ulcer. He underwent fourth metatarsal head excision, I&D with graft application on 5/46/5681. On 10/14/2020, he was noted to have flaccid right arm, right facial droop, and aphasia. Code Stroke was called and  he was found to have left M2 occlusion with 49 cc penumbra. He was transferred to [facility] for emergent thrombectomy. EKG en route showed ST elevation in inferior and anterolateteral leads. This was felt to be secondary to neurologic event and underlying CTO of RCA but not a true STEMI and decision was made to proceed with thrombectomy.    Assessment & Plan    Abnormal EKG History of CAD with CTO of RCA - Recent cardiac catheterization on 6/6 showed severe single vessel CAD with CTO of the mild RCA. Mild disease noted of LAD. - EKG after Code Stroke showed diffuse ST elevations in inferior and anterolateral leads. This was not felt to be a true STEMI and was felt to be secondary to neurologic event with known underlying CAD.  - High-sensitivity troponin minimally elevated at 26 >> 25 >> 25 > 25 >> 25  - Echo showed LVEF of 30-35% with akinesis of inferior and inferolateral walls and grade 2 diastolic dysfunction. No significant change from last Echo earlier this year. - Not a candidate for cardiac catheterization or IV Heparin in setting of acute stroke.  - He continues to have diffuse inferolateral and anterior ST elevation despite echo showing normal wall motion in the anterior wall.  He has known inferior and inferolateral on echo from recent RCA occlusion with R>L and L>L collaterals - I suspect that his diffuse ST changes are related to his large acute CVA and not due to STEMI - hsTrop has been minimally elevated with flat trend and not c/w with what would be seen with acute STEMI - will continue on ASA, Plavix, high dose statin - BB and Imdur on hold to allow permissive HTN in setting of acute CVA   Ischemic Cardiomyopathy Chronic Combined CHF - Echo showed LVEF of 30-35% with akinesis of inferior and inferolateral walls. - he does no appear volume overloaded on exam today - Home Lasix currently on hold. - Was on Lisinopril 20m daily, Toprol-XL 22mdaily, Spironolactone 2542maily,  and Imdur 71m7mily  at home. Currently all on hold in setting of recent stroke to allow for permissive hypertension. R - restart HF meds when ok with Neuro - Continue to monitor volume status closely.   Hypertension - Systolic BP in the 90s 27N140s. - Antihypertensives on hold to allow for permissive hypertension given recent stroke.   Hyperlipidemia - 10/15/2020: Cholesterol 160; HDL 38; LDL Cholesterol 101; Triglycerides 103; Triglycerides 105; VLDL 21  - Lipitor to 80mg31mly. - Will need repeat lipid panel and LFTs in 6-8 weeks.   Acute Stroke - S/p emergent thrombectomy on 10/14/2020. - Continue DAPT and statin. - Depending on recovery, can consider loop recorder to rule out atrial fibrillation/flutter.  NSVT -had a 15 beat run of NSVT on tele yesterday -K+ 4.8 and Mag 2 -restart Toprol when ok with Neuro -if he has further runs of VT may need to initiate Amio  For questions or updates, please contact [facility] consult [email or web address] for contact info under    I have spent a total of 30 minutes with patient reviewing 2D echo , telemetry, EKGs, labs and examining patient as well as establishing an assessment and plan  that was discussed with the patient.  > 50% of time was spent in direct patient care.       Signed, Fransico Him, MD [facility] 10/16/2020, 10:29 AM

## 2020-10-16 NOTE — Progress Notes (Signed)
Patient arrived to 3w-29

## 2020-10-16 NOTE — Progress Notes (Signed)
Physical Therapy Treatment Patient Details Name: Jonathan [name] MRN: [id number] DOB: [birth year] Today's Date: 10/16/2020    History of Present Illness The pt is a 60 yo male originally presenting 6/19 for 4th metatarsal head excision and I&D due to possible osteomyelitis. On 6/22, pt with acute onset R sided weakness and aphasia. Imaging showed L M2 occlusion, s/p thrombectomy on 6/22. Extubated 6/23. 15 beat Vtach on 6/23. PMH includes: RA, CHF, chronic venous stasis with L foot wound, HLD, and chronic anemia.    PT Comments    The pt was able to demo good improvement in communication and ability to follow commands to complete bed mobility this session. He was able to complete multiple series of rolling in bed to complete cleaning with minA of 1 to initiate movement, but pt with great effort. The pt required modA to come to sitting EOB, but was able to maintain static sitting without physical assist. Declined attempt for OOB transfer at this time due to reports of significant headache, VSS. Will continue to benefit from skilled PT acutely to facilitate maximal functional recovery, and reduce dependence on caregivers.     Follow Up Recommendations  CIR     Equipment Recommendations  None recommended by PT (pt well equipped)    Recommendations for Other Services       Precautions / Restrictions Precautions Precautions: Fall Precaution Comments: SBP< 140; post op shoe (not in room after transfer from [facility]) Required Braces or Orthoses: Other Brace Other Brace: post op shoe Restrictions Weight Bearing Restrictions: Yes LLE Weight Bearing: Weight bearing as tolerated    Mobility  Bed Mobility Overal bed mobility: Needs Assistance Bed Mobility: Supine to Sit;Sit to Supine;Rolling Rolling: Min assist   Supine to sit: Mod assist;HOB elevated Sit to supine: Mod assist;HOB elevated   General bed mobility comments: minA to complete rolling, slight assist to initiate, pt with good  effort and completing most of roll without use of bed rails. modA to complete movement of BLE to EOB, modA to elevate trunk to sitting    Transfers                 General transfer comment: deferred due to pt request to return to supine, reporting headache in sitting  Modified Rankin (Stroke Patients Only) Modified Rankin (Stroke Patients Only) Pre-Morbid Rankin Score: Moderately severe disability Modified Rankin: Severe disability     Balance Overall balance assessment: Needs assistance Sitting-balance support: Feet supported;Single extremity supported Sitting balance-Leahy Scale: Poor Sitting balance - Comments: no overt LOB, unable to reach outside BOS                                    Cognition Arousal/Alertness: Awake/alert Behavior During Therapy: Flat affect Overall Cognitive Status: Difficult to assess Area of Impairment: Following commands;Safety/judgement;Awareness;Orientation;Memory                       Following Commands: Follows one step commands inconsistently;Follows one step commands with increased time Safety/Judgement: Decreased awareness of safety;Decreased awareness of deficits     General Comments: difficult to assess due to expressive aphasia, pt stating yes to many questions during session, but able to answer a few open-ended questions such as "where is it hurting?" with pt responding "head" the pt follwed commands for LE movement well this session, slightly increased processing      Exercises      General  Comments General comments (skin integrity, edema, etc.): BP stable under BP limit today during session      Pertinent Vitals/Pain Pain Assessment: Faces Faces Pain Scale: Hurts whole lot Pain Location: headache Pain Descriptors / Indicators: Aching;Headache;Throbbing Pain Intervention(s): Limited activity within patient's tolerance;Monitored during session;Repositioned     PT Goals (current goals can now be  found in the care plan section) Acute Rehab PT Goals Patient Stated Goal: to go home PT Goal Formulation: With patient Time For Goal Achievement: 10/29/20 Potential to Achieve Goals: Good Progress towards PT goals: Progressing toward goals    Frequency    Min 4X/week      PT Plan Current plan remains appropriate    Co-evaluation PT/OT/SLP Co-Evaluation/Treatment: Yes Reason for Co-Treatment: For patient/therapist safety;To address functional/ADL transfers PT goals addressed during session: Mobility/safety with mobility;Balance        AM-PAC PT "6 Clicks" Mobility   Outcome Measure  Help needed turning from your back to your side while in a flat bed without using bedrails?: A Little Help needed moving from lying on your back to sitting on the side of a flat bed without using bedrails?: A Lot Help needed moving to and from a bed to a chair (including a wheelchair)?: Total Help needed standing up from a chair using your arms (e.g., wheelchair or bedside chair)?: Total Help needed to walk in hospital room?: Total Help needed climbing 3-5 steps with a railing? : Total 6 Click Score: 9    End of Session Equipment Utilized During Treatment: Gait belt Activity Tolerance: Patient tolerated treatment well Patient left: in bed;with call bell/phone within reach;with bed alarm set Nurse Communication: Mobility status PT Visit Diagnosis: Unsteadiness on feet (R26.81);Other abnormalities of gait and mobility (R26.89)     Time: 1610-9604 PT Time Calculation (min) (ACUTE ONLY): 48 min  Charges:  $Therapeutic Activity: 23-37 mins                     Karma Ganja, PT, DPT   Acute Rehabilitation Department Pager #: [phone]   Otho Bellows 10/16/2020, 4:07 PM

## 2020-10-16 NOTE — Progress Notes (Signed)
EKG CRITICAL VALUE     12 lead EKG performed.  Critical value noted.  Karl Luke, RN notified.   Willaim Rayas, CCT 10/16/2020 7:43 AM

## 2020-10-16 NOTE — Progress Notes (Addendum)
STROKE TEAM PROGRESS NOTE   INTERVAL HISTORY His RN and CCM is at the bedside. Case d/w CCM. He is now extubated, following commands. Ready to transfer to floor.  He has expressive aphasia and perseverates on the same idea but not able to speak fluently long sentences.  Seems to follow simple commands well.  There is mild weakness in the right upper extremity moves lower extremities well symmetrical.  CT head yesterday showed left basal ganglia and temporal parietal infarct with minimal petechial hemorrhage. Vitals:   10/16/20 0900 10/16/20 1000 10/16/20 1100 10/16/20 1200  BP: 121/61 115/61 115/64   Pulse: 85  92   Resp: 19 13 18    Temp:    97.8 F (36.6 C)  TempSrc:    Oral  SpO2: 99%  99%   Weight:      Height:       CBC:  Recent Labs  Lab 10/09/20 1859 10/10/20 0351 10/15/20 0500 10/16/20 0557  WBC 6.0   < > 6.1 12.0*  NEUTROABS 3.6  --   --   --   HGB 8.8*   < > 9.1* 9.7*  HCT 28.9*   < > 29.4* 30.8*  MCV 81.0   < > 79.0* 78.2*  PLT 293   < > 312 318   < > = values in this interval not displayed.    Basic Metabolic Panel:  Recent Labs  Lab 10/11/20 0409 10/12/20 0400 10/14/20 0321 10/15/20 0500 10/15/20 2104 10/16/20 0557  NA 133* 137   < > 139 133* 137  K 3.7 3.6   < > 3.8 4.6 4.8  CL 102 106   < > 109 103 105  CO2 25 25   < > 20* 13* 19*  GLUCOSE 110* 93   < > 88 65* 63*  BUN 8 8   < > 7 5* 5*  CREATININE 0.68  0.55* 0.61   < > 0.62 0.67 0.68  CALCIUM 9.2 9.1   < > 9.2 9.2 9.4  MG 1.7 1.6*   < > 1.8  --  2.0  PHOS 2.4* 1.9*  --   --   --   --    < > = values in this interval not displayed.    Lipid Panel:  Recent Labs  Lab 10/15/20 0500  CHOL 160  TRIG 103  105  HDL 38*  CHOLHDL 4.2  VLDL 21  LDLCALC 101*    HgbA1c:  Recent Labs  Lab 10/15/20 0500  HGBA1C 5.5    Urine Drug Screen:  Recent Labs  Lab 10/15/20 0408  LABOPIA POSITIVE*  COCAINSCRNUR NONE DETECTED  LABBENZ NONE DETECTED  AMPHETMU NONE DETECTED  THCU NONE DETECTED   LABBARB NONE DETECTED     Alcohol Level  Recent Labs  Lab 10/14/20 1853  ETH <10     IMAGING past 24 hours No results found.  PHYSICAL EXAM General: Appears well-developed; no distress Psych: Affect appropriate to situation Eyes: No scleral injection HENT: No OP obstrucion Head: Normocephalic.  Cardiovascular: Normal rate and regular rhythm. Reviewed EKG changes. Respiratory: Effort normal and breath sounds normal to anterior ascultation; Vent GI: Soft.  No distension. There is no tenderness.  Skin: WDI    Neurological Examination Mental Status: Alert, gives his name, but then repeats his name for every answer. Unclear what his baseline speech and cognition are given his developmental delay, but currently he seems to have expressive aphasia. Able to follow 1 and two-step step commands without  difficulty. Cranial Nerves: II: decreased blink on Right, compared to the left.  Not cooperative to check for hemianopsia III,IV, VI: ptosis not present, Right gaze pref, but able to cross midline. extra-ocular motions intact bilaterally, pupils equal, round, reactive to light and accommodation V,VII: unable to test facial symmetry d/t ETT, facial light touch sensation normal bilaterally VIII: hearing normal bilaterally IX,X: uvula rises symmetrically XI: bilateral shoulder shrug XII: moves tongue around ETT/ extension Motor: Tone and bulk:normal tone throughout; no atrophy noted, Right arm/leg has some movements 2/5 leg, 3/5 arm, but cannot hold against gravity.  Distal greater than proximal weakness.  Freely moving left side wnl Sensory: Pinprick and light touch intact throughout, bilaterally Deep Tendon Reflexes: 2+ and symmetric throughout Plantars: Right: downgoing   Left: downgoing Cerebellar: normal finger-to-nose, normal rapid alternating movements and normal heel-to-shin test Gait: normal gait and station   ASSESSMENT/PLAN Jonathan [name] is a 60 y.o. male initially  admitted with left foot ulcer and osteo on vanc and zosyn, noted to have acute R sided weakness and aphasia and transferred to [facility] for thrombectomy for potential LMCA M2 occlusion vs high grade stenosis. EKG enroute with ST elevation concerning for STEMI; this was d/w cardiology on call, they reviewed his recent cath and thought the the EKG findings likely from known RCA rather than LAD. Heparin gtt was started.   Stroke: infarct embolic appearing; work up underway for better etiology.  Code Stroke CT head No acute abnormality. Small vessel disease. Atrophy. ASPECTS 10.    CTA head & neck LM1 occlusion, s/p thrombectomy with TICI 3 flow F/u [facility] 6/23 without bleed. Heparin gtt was stopped. CT perfusion 11ml penumbra with no core. MRI  LMCA and BG with small petechial hemorrhage noted 2D Echo 30-35% EF, akinesis of inferior/inferolateral walls; hypokinetic; severe LV dysfunction. LV dilation, LA normal.  LDL 101 HgbA1c 5.5 VTE prophylaxis - post procedure with SCDs    Diet   Diet Carb Modified Fluid consistency: Thin; Room service appropriate? Yes with Assist   aspirin 81 mg daily prior to admission, now on heparin IV. Plan to stop heparin today, then DAPT to be started x3 weeks, then plavix only Therapy recommendations: CLR  disposition:  pending  Hypertension Home meds:  imdur, Aldactone, Toprol XL Stable Permissive hypertension (OK if < 220/120) but gradually normalize in 5-7 days Long-term BP goal normotensive  Hyperlipidemia Home meds:  none, resumed in hospital LDL 101, goal < 70 Add Lipitor 40mg  High intensity statin  Continue statin at discharge  Diabetes type II- no dx HgbA1c 5.5, goal < 7.0 CBGs Recent Labs    10/14/20 1658  GLUCAP 115*     SSI  Other Stroke Risk Factors  Former Cigarette smoker Coronary artery disease Congestive heart failure  Other Active Problems STEMI- Cardiology following. After d/w CCM and cards, heparin gtt stopped 2/95/2841 diastolic  HF Developmental delay, Autism spectrum disorder Left foot Wound with Osteomyelitis- abx per primary team  Hospital day # 7  Desiree Metzger-Cihelka, ARNP-C, ANVP-BC Pager: [phone]  I have personally obtained history,examined this patient, reviewed notes, independently viewed imaging studies, participated in medical decision making and plan of care.ROS completed by me personally and pertinent positives fully documented  I have made any additions or clarifications directly to the above note. Agree with note above.  Patient has done well following thrombectomy but still has expressive aphasia unclear how much of this is baseline deficit from his autism spectrum disorder.  He does have some right upper  extremity weakness from his left basal ganglia infarct.  Recommend mobilize out of bed.  Ongoing therapy consults.  Transfer back to medical hospitalist team after leaving ICU.  Continue aspirin and Plavix for 3 weeks followed by Plavix alone for stroke prevention and aggressive risk factor modification. No family available for discussion. This patient is critically ill and at significant risk of neurological worsening, death and care requires constant monitoring of vital signs, hemodynamics,respiratory and cardiac monitoring, extensive review of multiple databases, frequent neurological assessment, discussion with family, other specialists and medical decision making of high complexity.I have made any additions or clarifications directly to the above note.This critical care time does not reflect procedure time, or teaching time or supervisory time of PA/NP/Med Resident etc but could involve care discussion time.  I spent 30 minutes of neurocritical care time  in the care of  this patient.     Antony Contras, MD Medical Director [place] Pager: [phone] 10/16/2020 2:28 PM   10/16/2020 12:18 PM  To contact Stroke Continuity provider, please refer to [email or web address] After hours,  contact General Neurology

## 2020-10-16 NOTE — Progress Notes (Addendum)
NAME:  Jonathan [name], MRN:  [id number], DOB:  28-Oct-1960, LOS: 7 ADMISSION DATE:  10/09/2020, CONSULTATION DATE:  6/23 REFERRING MD:  Dr. Lorrin Goodell, CHIEF COMPLAINT:  L MCA M2 occlusion   History of Present Illness:  Patient is a 60 yo M w/ PMH of autism, RA, HFrEF (30-35%), chronic venous stasis w/ left foot ulcer, recent onychomycosis s/p debridement admitted to [facility] on 6/17 with L foot ulcer and osteomyelitis on Vanc and zosyn; required 4th metatarsal head excision.  On 6/22, code stroke activated for acute aphasia and R sided weakness at 1300. CTH negative. CTA head and neck w/ L MCA M2 occlusion vs severe stenosis. CT perfusion w/ 49 mL penumbra w/ no core. Patient transferred to [facility] for thrombectomy. EKG during transport w/ ST elevations. Cardiology consulted and looke at recent cardiac cath on 09/28/2020 and does not think LAD was occluded; probably from RCA occlusion. Cardiology recommended to proceed with thrombectomy at this time. Mechanical thrombectomy performed w/ complete recanalization. Patient intubated for procedure and arrived to 4N ICU on mechanical ventilation.  PCCM consulted for airway management and medical management.    Pertinent  Medical History   Past Medical History:  Diagnosis Date   Anemia    H/o using iron in the past    Arthritis    rheumatoid   Chronic back pain    Chronic HFrEF (heart failure with reduced ejection fraction) ([place]) 08/2019   Admitted 6 4-7/20/2022 with CHF   Coronary artery disease, occlusive 08/01/2019   RCA CTO with left-to-right collaterals.  Widely patent LCA-large LAD was several small diagonal branches and small diffusely diseased LCx with multiple OM branches.   Hyperlipidemia    Ischemic cardiomyopathy 09/02/2019   Initial EF was 40 to 45%, as of June 2022 EF now 30 to 35%.  Known RCA occlusion.  There is inferior and inferoseptal akinesis on echo.   Joint pain    Joint swelling    Rheumatoid arthritis(714.0)    Significant  Hospital Events: Including procedures, antibiotic start and stop dates in addition to other pertinent events   6/17: Admitted [facility] for L foot ulcer w/ osteomyelitis; started on Vanc and Zosyn 6/22: Code Stroke; CTA head and neck w/ L MCA M2 occlusion vs severe stenosis; transferred to [facility] for thrombectomy c/w complete revascularization.  STE on EKG noted, cards consulted 6/23 extubated; heparin gtt stopped; tiny petechial hemorrhage noted on MRI, f/u CTH neg for acute hemorrhage; passed SBT  Interim History / Subjective:  No acute events overnight  Objective   Blood pressure 110/60, pulse 81, temperature 97.7 F (36.5 C), temperature source Oral, resp. rate 15, height 5\' 3"  (1.6 m), weight 63.6 kg, SpO2 97 %.    Vent Mode: Stand-by   Intake/Output Summary (Last 24 hours) at 10/16/2020 1012 Last data filed at 10/16/2020 0800 Gross per 24 hour  Intake 568.74 ml  Output 1500 ml  Net -931.26 ml   Filed Weights   10/14/20 0454 10/15/20 0500 10/16/20 0415  Weight: 63 kg 63 kg 63.6 kg   Examination: General:  Older adult male in NAD in bed HEENT: MM pink/moist, pupils 3/reactive, slight facial droop Neuro: Awake, follows simple commands in all extremities, repetitively states name to questions, MAE with generalized weakness, slightly weaker on right, unable to lift to gravity CV:  NSR with some occasional ectopy  PULM:  non labored, clear, on room air GI: soft, bs+, foley pouch  Extremities: warm/dry, no LE edema, left foot wrapped, some pressure dressings  to right heel, deformities of bilateral hands (RA), unclear if able to perform ROM given severe RA Skin: no rashes   Labs/imaging that I havepersonally reviewed  (right click and "Reselect all SmartList Selections" daily)  CBC/ BMET reviewed EKG- unchanged   6/23 MRI brain 6/23 [facility] Problem list    Assessment & Plan:   L MCA M2 occlusion s/p mech thrombectomy 6/22 w/complete revascularization; out of window  for tPA  P: - Per stroke team - pending tx out of ICU to [facility] as primary 6/25 - ongoing PT/ OT/ SLP  Post-Operative Acute Respiratory Failure secondary to Mechanical Thrombectomy P: - resolved, extubated 6/23, currently on room air - ongoing pulmonary hygiene  - SLP following for diet/ speech   STEMI: noted in route from [facility] to [facility]; Cardiology believes likely due to neurologic event; recent [facility] 6/6 showing chronic total RCA occlusion, EF 30-35%, left coronaries patent P: - cards following  - troponin hs flat, unchanged TTE, and unchanged EKG on this admit - heparin gtt stopped 6/23 - will need to clarify DAPT schedule - stroke is for 21 days, defer to cards for continuation  Chronic L foot ulcer cellulitis: Xray and MRI concerning for early osteomyelitis. s/p 4th metatarsal excision 6/17 by podiatry P: - per podiatry - surgical pathology neg for osteo; left foot wound cx growing proteus and staph cohnii > doxycycline/ ancef for 10 days of abx, stop date 6/26 - ongoing PT/ OT   Abnormal ABI P: - vascular surgery consulted: no need for intervention - continue statins and ASA  Chronic HFrEF: Echo 09/26/2020: EF 30-35%; Mod decreased LV function; grade II diastolic dystfunction. Essential HTN HLD P: - BP normotensive, continue to hold BP meds for now (lasix, aldactone, imdur, metoprolol, and lisinopril for now, appreciate cards input on home meds) - continue statin - 15 beat run of Vtach 6/23. Ectopy improved with electrolyte supplementation, goal K > 4, Mag >2  Rheumatoid Arthritis P: - continue home prednisone 5 mg daily; holding arava given foot infection  Anemia of chronic disease P: - CBC stable, trend  Chronic pain syndrome P: - gabapentin  Best Practice    Diet/type: per SLP, carb modified, thin Pain/Anxiety/Delirium protocol: d/c  VAP protocol (if indicated): Not indicated DVT prophylaxis: prophylactic heparin SQ GI prophylaxis: PPI  Glucose control:  not  indicated, trend on BMET Central venous access:  N/A Arterial line:  N/A, dc'd 6/23 Foley:  N/A Mobility:  OOB progress as tolerated PT consulted: Yes Studies pending: None Culture data pending:none Last reviewed culture data:today Antibiotics:ancef and doxycycline  Antibiotic de-escalation: no,  continue current rx Stop date: ordered 10 days of abx therapy, currently day 8/10 Daily labs: ordered Code Status:  full code Last date of multidisciplinary goals of care discussion [pending] ccm prognosis: stable  Disposition: ready for transfer to progressive care  Nothing further to add.  PCCM will sign off.  Please call us back if we can be of any further assistance.  TRH to assume primary care 6/25  Tenny Craw, called and updated from [facility] standpoint.  Questions answered.  She states he lives with her for the last 17 yrs and for the most part, he is independent in his ADLs, but not cooking.  At baseline, he is able to answer orientation questions.    Labs   CBC: Recent Labs  Lab 10/09/20 1859 10/10/20 0351 10/11/20 0409 10/12/20 0400 10/14/20 0321 10/14/20 2157 10/15/20 0500 10/16/20 0557  WBC 6.0   < >  9.2 10.1 7.0  --  6.1 12.0*  NEUTROABS 3.6  --   --   --   --   --   --   --   HGB 8.8*   < > 8.8* 9.2* 8.5* 8.2* 9.1* 9.7*  HCT 28.9*   < > 28.0* 29.9* 27.5* 24.0* 29.4* 30.8*  MCV 81.0   < > 78.9* 78.7* 78.8*  --  79.0* 78.2*  PLT 293   < > 272 277 284  --  312 318   < > = values in this interval not displayed.    Basic Metabolic Panel: Recent Labs  Lab 10/11/20 0409 10/12/20 0400 10/14/20 0321 10/14/20 2157 10/15/20 0500 10/15/20 2104 10/16/20 0557  NA 133* 137 137 139 139 133* 137  K 3.7 3.6 4.3 3.8 3.8 4.6 4.8  CL 102 106 108  --  109 103 105  CO2 25 25 22   --  20* 13* 19*  GLUCOSE 110* 93 107*  --  88 65* 63*  BUN 8 8 10   --  7 5* 5*  CREATININE 0.68  0.55* 0.61 0.53*  --  0.62 0.67 0.68  CALCIUM 9.2 9.1 9.1  --  9.2 9.2 9.4  MG 1.7 1.6* 1.8  --   1.8  --  2.0  PHOS 2.4* 1.9*  --   --   --   --   --    GFR: Estimated Creatinine Clearance: 80 mL/min (by C-G formula based on SCr of 0.68 mg/dL). Recent Labs  Lab 10/12/20 0400 10/14/20 0321 10/15/20 0500 10/16/20 0557  WBC 10.1 7.0 6.1 12.0*    Liver Function Tests: Recent Labs  Lab 10/10/20 0351 10/11/20 0409 10/12/20 0400 10/15/20 0500  AST 19  --   --  30  ALT 11  --   --  21  ALKPHOS 80  --   --  69  BILITOT 0.4  --   --  0.8  PROT 6.5  --   --  6.1*  ALBUMIN 2.9* 2.9* 2.5* 2.4*   No results for input(s): LIPASE, AMYLASE in the last 168 hours. No results for input(s): AMMONIA in the last 168 hours.  ABG    Component Value Date/Time   PHART 7.483 (H) 10/14/2020 2157   PCO2ART 37.5 10/14/2020 2157   PO2ART 548 (H) 10/14/2020 2157   HCO3 28.1 (H) 10/14/2020 2157   TCO2 29 10/14/2020 2157   O2SAT 100.0 10/14/2020 2157     Coagulation Profile: No results for input(s): INR, PROTIME in the last 168 hours.  Cardiac Enzymes: No results for input(s): CKTOTAL, CKMB, CKMBINDEX, TROPONINI in the last 168 hours.  HbA1C: Hgb A1c MFr Bld  Date/Time Value Ref Range Status  10/15/2020 05:00 AM 5.5 4.8 - 5.6 % Final    Comment:    (NOTE) Pre diabetes:          5.7%-6.4%  Diabetes:              >6.4%  Glycemic control for   <7.0% adults with diabetes   08/21/2019 05:12 PM 5.7 (H) 4.8 - 5.6 % Final    Comment:    (NOTE) Pre diabetes:          5.7%-6.4% Diabetes:              >6.4% Glycemic control for   <7.0% adults with diabetes     CBG: Recent Labs  Lab 10/14/20 1658  GLUCAP 115*  Kennieth Rad, ACNP [facility] 10/16/2020, 10:12 AM

## 2020-10-16 NOTE — Progress Notes (Signed)
Inpatient Rehab Admissions Coordinator Note:   Per PT recommendations, pt was screened for CIR candidacy by Shann Medal, PT, DPT.  At this time we are recommending a CIR consult and I will place an order per our protocol.  Please contact me with questions.   Shann Medal, PT, DPT [phone] 10/16/20 10:24 AM

## 2020-10-16 NOTE — Progress Notes (Addendum)
Occupational Therapy Evaluation  Pt admitted with the below listed diagnoses, and the below listed deficits.  He currently requires total A for all aspects of ADLs. He is able to assist with bed mobility and requires min A to maintain EOB sitting.  He deferred OOB or standing this date due to HA.  Pt demonstrates dysconjugate gaze with diplopia, and significant nystagmus.   Per chart review, pt was living with his sister and had aids that assisted him with ADLs. He was ambulating with crutches.  Pt demonstrates a significant decline in function and will benefit from continued OT to maximize his independence.  Feel he would benefit from CIR level rehab to allow him to maximize his independence and safety.  Will follow acutely.    10/16/20 1700  OT Visit Information  Last OT Received On 10/16/20  Assistance Needed +2  PT/OT/SLP Co-Evaluation/Treatment Yes  Reason for Co-Treatment For patient/therapist safety;To address functional/ADL transfers  OT goals addressed during session ADL's and self-care;Strengthening/ROM  History of Present Illness The pt is a 60 yo male originally presenting 6/19 for 4th metatarsal head excision and I&D due to possible osteomyelitis. On 6/22, pt with acute onset R sided weakness and aphasia. Imaging showed L M2 occlusion, s/p thrombectomy on 6/22. Extubated 6/23. 15 beat Vtach on 6/23. PMH includes: RA, CHF, chronic venous stasis with L foot wound, HLD, and chronic anemia.  Precautions  Precautions Fall  Precaution Comments SBP< 140; post op shoe (not in room after transfer from [facility])  Required Braces or Orthoses Other Brace  Other Brace post op shoe  Restrictions  Weight Bearing Restrictions Yes  LLE Weight Bearing WBAT  Other Position/Activity Restrictions in post-op shoe (shoe not in room - ortho tech contacted for a replacement)  Home Living  Family/patient expects to be discharged to: Private residence  Living Arrangements Spouse/significant other  Available  Help at Discharge Family;Available 24 hours/day  Type of Home House  Home Access Stairs to enter  Entrance Stairs-Number of Steps 5  Entrance Stairs-Rails Right;Left  Home Layout One level  Bathroom Biomedical scientist Yes  Home Equipment Toilet riser;Shower seat;BSC;Hospital bed;Wheelchair - power  Additional Comments has a lift chair that heps him stand. has a hospital bed that he elevates as well for standing. - all information from prior charting prior to onset of aphasia. Pt unable to provide info, but chart review indicates that he lives wtih his cousin and had aids 7 days/week  Prior Function  Level of Independence Needs assistance  Gait / Transfers Assistance Needed per chart review, pt was ambulating with crutches  ADL's / [place] aide comes daily for assistance with bathing and dressing, getting out of bed,  longer utensiles for feeding,  Comments ambulates with crutches  Communication  Communication Receptive difficulties;Expressive difficulties  Pain Assessment  Pain Assessment Faces  Faces Pain Scale 8  Pain Location headache  Pain Descriptors / Indicators Aching;Headache;Throbbing  Pain Intervention(s) Monitored during session;Limited activity within patient's tolerance;Repositioned  Cognition  Arousal/Alertness Awake/alert  Behavior During Therapy Flat affect  Overall Cognitive Status Difficult to assess  Area of Impairment Following commands;Safety/judgement;Awareness;Orientation;Memory  Following Commands Follows one step commands inconsistently;Follows one step commands with increased time  General Comments difficult to assess due to expressive aphasia, pt stating yes to many questions during session, but able to answer a few open-ended questions such as "where is it hurting?" with pt responding "head" the pt follwed commands for LE movement  well this session, slightly increased processing.   He was also able to tell me he is at [facility], and it is June.  Difficult to assess due to Impaired communication  Upper Extremity Assessment  Upper Extremity Assessment RUE deficits/detail;LUE deficits/detail  RUE Deficits / Details Pt with limited ROM bil. 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  Hand to mouth limited to ~60* AAROM.  Rt hand with significant arthritic deformity.  RUE Coordination decreased fine motor;decreased gross motor  LUE Deficits / Details Pt with limited ROM bil. 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  Hand to mouth limited to ~70* AAROM.  Lt hand with arthritic deformity  LUE Coordination decreased gross motor;decreased fine motor  Lower Extremity Assessment  Lower Extremity Assessment Defer to PT evaluation  ADL  Overall ADL's  Needs assistance/impaired  Eating/Feeding Total assistance;Sitting;Bed level  Eating/Feeding Details (indicate cue type and reason) REquires set up and assistance to feed. At home has specialized utensils.  Upper Body Bathing Total assistance;Sitting;Bed level  Lower Body Bathing Total assistance;Sitting/lateral leans  Upper Body Dressing  Total assistance;Sitting;Bed level  Lower Body Dressing Total assistance;Bed level  Toilet Transfer Total assistance  Toilet Transfer Details (indicate cue type and reason) unable to attempt due to dizziness and headache  Toileting- Clothing Manipulation and Hygiene Total assistance;Bed level  Toileting - Clothing Manipulation Details (indicate cue type and reason) pt incontinent of stool.  He was assisted with peri care bed level  Functional mobility during ADLs Total assistance  Vision- History  Patient Visual Report Diplopia;Blurring of vision  Vision- Assessment  Vision Assessment? Yes  Ocular Range of Motion Restricted on the right  Tracking/Visual Pursuits Decreased  smoothness of horizontal tracking;Decreased smoothness of vertical tracking;Decreased smoothness of eye movement to RIGHT superior field;Decreased smoothness of eye movement to LEFT superior field;Decreased smoothness of eye movement to RIGHT inferior field;Decreased smoothness of eye movement to LEFT inferior field;Unable to hold eye position out of midline  Convergence Impaired (comment)  Diplopia Assessment Disappears with one eye closed;Present all the time/all directions  Additional Comments Pt with roving eyes bil.   Significant nystagmus all quadrants.  He demonsrates full EOMs, but unable to sustain abduction Rt eye.  Perception  Perception Tested? No  Praxis  Praxis tested? Not tested  Bed Mobility  Overal bed mobility Needs Assistance  Bed Mobility Supine to Sit;Sit to [employer] assist  Supine to sit Mod assist;HOB elevated  Sit to supine Mod assist;HOB elevated  General bed mobility comments minA to complete rolling, slight assist to initiate, pt with good effort and completing most of roll without use of bed rails. modA to complete movement of BLE to EOB, modA to elevate trunk to sitting  Transfers  General transfer comment deferred due to pt request to return to supine, reporting headache in sitting  Balance  Overall balance assessment Needs assistance  Sitting-balance support Feet supported;Single extremity supported  Sitting balance-Leahy Scale Poor  Sitting balance - Comments no overt LOB, unable to reach outside BOS  General Comments  General comments (skin integrity, edema, etc.) BP stable under BP limit today during session  OT - End of Session  Activity Tolerance Patient limited by pain  Patient left in bed;with call bell/phone within reach;with bed alarm set  Nurse Communication Mobility status  OT Assessment  OT Recommendation/Assessment Patient needs continued OT Services  OT Visit Diagnosis Cognitive communication deficit (  R41.841);Muscle  weakness (generalized) (M62.81)  Pain - part of body  (head)  OT Problem List Decreased strength;Decreased range of motion;Decreased activity tolerance;Impaired balance (sitting and/or standing);Impaired vision/perception;Decreased coordination;Decreased cognition;Decreased safety awareness;Decreased knowledge of use of DME or AE;Impaired UE functional use;Pain  Barriers to Discharge Comments unsure level of support at discharge  OT Plan  OT Frequency (ACUTE ONLY) Min 2X/week  OT Treatment/Interventions (ACUTE ONLY) Self-care/ADL training;DME and/or AE instruction;Therapeutic activities;Balance training;Patient/family education;Neuromuscular education;Manual therapy;Cognitive remediation/compensation;Visual/perceptual remediation/compensation  AM-PAC OT "6 Clicks" Daily Activity Outcome Measure (Version 2)  Help from another person eating meals? 1  Help from another person taking care of personal grooming? 1  Help from another person toileting, which includes using toliet, bedpan, or urinal? 1  Help from another person bathing (including washing, rinsing, drying)? 1  Help from another person to put on and taking off regular upper body clothing? 1  Help from another person to put on and taking off regular lower body clothing? 1  6 Click Score 6  OT Recommendation  Recommendations for Other Services Rehab consult  Follow Up Recommendations CIR  OT Equipment None recommended by OT  Individuals Consulted  Consulted and Agree with Results and Recommendations Patient  Acute Rehab OT Goals  Patient Stated Goal unable to state  OT Goal Formulation With patient  Time For Goal Achievement 10/30/20  Potential to Achieve Goals Good  OT Time Calculation  OT Start Time (ACUTE ONLY) 1450  OT Stop Time (ACUTE ONLY) 1545  OT Time Calculation (min) 55 min  OT General Charges  $OT Visit 1 Visit  Written Expression  Dominant Hand Right     10/16/20 1700  OT Time Calculation  OT Start Time (ACUTE  ONLY) 1450  OT Stop Time (ACUTE ONLY) 1545  OT Time Calculation (min) 55 min  OT General Charges  $OT Visit 1 Visit  OT Evaluation  $OT Eval Moderate Complexity 1 Mod  OT Treatments  $Self Care/Home Management  8-22 mins     Nilsa Nutting., OTR/L Acute Rehabilitation Services Pager [phone] Office [phone]

## 2020-10-17 DIAGNOSIS — E785 Hyperlipidemia, unspecified: Secondary | ICD-10-CM

## 2020-10-17 DIAGNOSIS — I639 Cerebral infarction, unspecified: Secondary | ICD-10-CM

## 2020-10-17 LAB — GLUCOSE, CAPILLARY: Glucose-Capillary: 91 mg/dL (ref 70–99)

## 2020-10-17 LAB — BASIC METABOLIC PANEL
Anion gap: 14 (ref 5–15)
BUN: 9 mg/dL (ref 6–20)
CO2: 15 mmol/L — ABNORMAL LOW (ref 22–32)
Calcium: 9.3 mg/dL (ref 8.9–10.3)
Chloride: 108 mmol/L (ref 98–111)
Creatinine, Ser: 0.69 mg/dL (ref 0.61–1.24)
GFR, Estimated: >60 mL/min (ref >60)
Glucose, Bld: 92 mg/dL (ref 70–99)
Potassium: 5.7 mmol/L — ABNORMAL HIGH (ref 3.5–5.1)
Sodium: 137 mmol/L (ref 135–145)

## 2020-10-17 LAB — CBC
HCT: 30.4 % — ABNORMAL LOW (ref 39.0–52.0)
Hemoglobin: 10 g/dL — ABNORMAL LOW (ref 13.0–17.0)
MCH: 24.9 pg — ABNORMAL LOW (ref 26.0–34.0)
MCHC: 32.9 g/dL (ref 30.0–36.0)
MCV: 75.6 fL — ABNORMAL LOW (ref 80.0–100.0)
Platelets: 285 10*3/uL (ref 150–400)
RBC: 4.02 MIL/uL — ABNORMAL LOW (ref 4.22–5.81)
RDW: 16.5 % — ABNORMAL HIGH (ref 11.5–15.5)
WBC: 7.5 10*3/uL (ref 4.0–10.5)
nRBC: 0 % (ref 0.0–0.2)

## 2020-10-17 LAB — AEROBIC/ANAEROBIC CULTURE W GRAM STAIN (SURGICAL/DEEP WOUND): Gram Stain: NONE SEEN

## 2020-10-17 LAB — POTASSIUM: Potassium: 3.4 mmol/L — ABNORMAL LOW (ref 3.5–5.1)

## 2020-10-17 MED ORDER — ISOSORBIDE MONONITRATE ER 60 MG PO TB24
60.0000 mg | ORAL_TABLET | Freq: Every day | ORAL | Status: DC
  Administered 2020-10-17 – 2020-10-18 (×2): 60 mg via ORAL
  Filled 2020-10-17 (×2): qty 1

## 2020-10-17 MED ORDER — POTASSIUM CHLORIDE CRYS ER 20 MEQ PO TBCR
40.0000 meq | EXTENDED_RELEASE_TABLET | ORAL | Status: AC
  Administered 2020-10-17 (×2): 40 meq via ORAL
  Filled 2020-10-17 (×2): qty 2

## 2020-10-17 MED ORDER — METOPROLOL SUCCINATE ER 100 MG PO TB24
100.0000 mg | ORAL_TABLET | Freq: Every day | ORAL | Status: DC
  Administered 2020-10-17: 100 mg via ORAL
  Filled 2020-10-17: qty 1

## 2020-10-17 MED ORDER — METOPROLOL SUCCINATE ER 25 MG PO TB24
25.0000 mg | ORAL_TABLET | Freq: Every day | ORAL | Status: DC
  Filled 2020-10-17: qty 1

## 2020-10-17 MED ORDER — LISINOPRIL 10 MG PO TABS
10.0000 mg | ORAL_TABLET | Freq: Every day | ORAL | Status: DC
  Administered 2020-10-17: 10 mg via ORAL
  Filled 2020-10-17: qty 4
  Filled 2020-10-17: qty 1

## 2020-10-17 NOTE — Progress Notes (Signed)
Paged and spoke with Neurology on call and Dr. Quentin Ore with cardiology to review EKG and labs this am.

## 2020-10-17 NOTE — Progress Notes (Signed)
Inpatient Rehab Admissions Coordinator:    I spoke with pt. And cousin (Pt.'s primary caregiver) and they are interested in CIR. Pt.'s cousin reports she already provided 24/7 support for Pt. And will continue to do so after discharge. I will follow for potential admission once medically ready.  Clemens Catholic, [place], [place] Admissions Coordinator  [phone] ([place]) [phone] (office)

## 2020-10-17 NOTE — Progress Notes (Signed)
EKG CRITICAL VALUE     12 lead EKG performed.  Critical value noted.  Clois Dupes, RN notified.   Warren Lacy, CCT 10/17/2020 8:05 AM

## 2020-10-17 NOTE — Progress Notes (Signed)
Cardiology Progress Note  Patient ID: Jonathan Jonathan [name] MRN: [id number] DOB: October 07, 1960 Date of Encounter: 10/17/2020  Primary Cardiologist: Minus Breeding, MD  Subjective   Chief Complaint: None.  HPI: Unable to speak Jonathan [name] to expressive aphasia.  He signals he is in no pain.  I specifically asked him about chest pain.  ROS:  All other ROS reviewed and negative. Pertinent positives noted in the HPI.     Inpatient Medications  Scheduled Meds:  aspirin  81 mg Oral Daily   atorvastatin  80 mg Oral Daily   Chlorhexidine Gluconate Cloth  6 each Topical Daily   clopidogrel  75 mg Oral Daily   enoxaparin (LOVENOX) injection  40 mg Subcutaneous Q24H   gabapentin  300 mg Oral BID   mouth rinse  15 mL Mouth Rinse BID   montelukast  10 mg Oral QHS   pantoprazole  40 mg Oral Daily   predniSONE  5 mg Oral BID WC   Continuous Infusions:   ceFAZolin (ANCEF) IV 2 g (10/17/20 0625)   doxycycline (VIBRAMYCIN) IV 100 mg (10/17/20 0946)   PRN Meds: acetaminophen **OR** acetaminophen (TYLENOL) oral liquid 160 mg/5 mL **OR** acetaminophen, fluticasone, ondansetron **OR** ondansetron (ZOFRAN) IV, senna-docusate   Vital Signs   Vitals:   10/17/20 0414 10/17/20 0500 10/17/20 0657 10/17/20 0746  BP: 109/62  (!) 109/52 (!) 110/50  Pulse: 90  93 87  Resp:    19  Temp: 97.7 F (36.5 C)  98 F (36.7 C) 98.1 F (36.7 C)  TempSrc: Oral  Oral Oral  SpO2: 97%  100% 98%  Weight:  64.6 kg    Height:        Intake/Output Summary (Last 24 hours) at 10/17/2020 1040 Last data filed at 10/17/2020 0500 Gross per 24 hour  Intake --  Output 1000 ml  Net -1000 ml   Last 3 Weights 10/17/2020 10/16/2020 10/15/2020  Weight (lbs) 142 lb 6.7 oz 140 lb 3.4 oz 138 lb 14.2 oz  Weight (kg) 64.6 kg 63.6 kg 63 kg      Telemetry  Overnight telemetry shows sinus rhythm heart rate in the 70s, normal sustained ventricular tachycardia noted, which I personally reviewed.   ECG  The most recent ECG shows sinus  rhythm, diffuse ST elevation noted, unchanged from prior EKG, which I personally reviewed.   Physical Exam   Vitals:   10/17/20 0414 10/17/20 0500 10/17/20 0657 10/17/20 0746  BP: 109/62  (!) 109/52 (!) 110/50  Pulse: 90  93 87  Resp:    19  Temp: 97.7 F (36.5 C)  98 F (36.7 C) 98.1 F (36.7 C)  TempSrc: Oral  Oral Oral  SpO2: 97%  100% 98%  Weight:  64.6 kg    Height:        Intake/Output Summary (Last 24 hours) at 10/17/2020 1040 Last data filed at 10/17/2020 0500 Gross per 24 hour  Intake --  Output 1000 ml  Net -1000 ml    Last 3 Weights 10/17/2020 10/16/2020 10/15/2020  Weight (lbs) 142 lb 6.7 oz 140 lb 3.4 oz 138 lb 14.2 oz  Weight (kg) 64.6 kg 63.6 kg 63 kg    Body mass index is 25.23 kg/m.  General: Well nourished, well developed, in no acute distress Head: Atraumatic, normal size  Eyes: PEERLA, EOMI  Neck: Supple, no JVD Endocrine: No thryomegaly Cardiac: Normal S1, S2; RRR; no murmurs, rubs, or gallops Lungs: Clear to auscultation bilaterally, no wheezing, rhonchi or rales  Abd: Soft, nontender,  no hepatomegaly  Ext: No edema Musculoskeletal: No deformities Neuro: Alert, awake, expressive aphasia noted  Labs  High Sensitivity Troponin:   Recent Labs  Lab 10/15/20 0036 10/15/20 0252 10/15/20 0830 10/16/20 1052 10/16/20 1225  TROPONINIHS 25* 25* 25* 26* 30*     Cardiac EnzymesNo results for input(s): TROPONINI in the last 168 hours. No results for input(s): TROPIPOC in the last 168 hours.  Chemistry Recent Labs  Lab 10/11/20 0409 10/12/20 0400 10/14/20 0321 10/15/20 0500 10/15/20 2104 10/16/20 0557 10/17/20 0105  NA 133* 137   < > 139 133* 137 137  K 3.7 3.6   < > 3.8 4.6 4.8 5.7*  CL 102 106   < > 109 103 105 108  CO2 25 25   < > 20* 13* 19* 15*  GLUCOSE 110* 93   < > 88 65* 63* 92  BUN 8 8   < > 7 5* 5* 9  CREATININE 0.68  0.55* 0.61   < > 0.62 0.67 0.68 0.69  CALCIUM 9.2 9.1   < > 9.2 9.2 9.4 9.3  PROT  --   --   --  6.1*  --   --   --    ALBUMIN 2.9* 2.5*  --  2.4*  --   --   --   AST  --   --   --  30  --   --   --   ALT  --   --   --  21  --   --   --   ALKPHOS  --   --   --  69  --   --   --   BILITOT  --   --   --  0.8  --   --   --   GFRNONAA >60  >60 >60   < > >60 >60 >60 >60  ANIONGAP 6 6   < > 10 17* 13 14   < > = values in this interval not displayed.    Hematology Recent Labs  Lab 10/15/20 0500 10/16/20 0557 10/17/20 0105  WBC 6.1 12.0* 7.5  RBC 3.72* 3.94* 4.02*  HGB 9.1* 9.7* 10.0*  HCT 29.4* 30.8* 30.4*  MCV 79.0* 78.2* 75.6*  MCH 24.5* 24.6* 24.9*  MCHC 31.0 31.5 32.9  RDW 16.1* 16.3* 16.5*  PLT 312 318 285   BNPNo results for input(s): BNP, PROBNP in the last 168 hours.  DDimer No results for input(s): DDIMER in the last 168 hours.   Radiology  No results found.  Cardiac Studies  TTE 10/15/2020  1. Akinesis of the inferior and inferolateral walls; remaining walls  hypokinetic; overall moderate to severe LV dysfunction.   2. Left ventricular ejection fraction, by estimation, is 30 to 35%. The  left ventricle has moderate to severely decreased function. The left  ventricle demonstrates regional wall motion abnormalities (see scoring  diagram/findings for description). The  left ventricular internal cavity size was mildly dilated. Left ventricular  diastolic parameters are consistent with Grade II diastolic dysfunction  (pseudonormalization). Elevated left atrial pressure.   3. Right ventricular systolic function is normal. The right ventricular  size is normal.   4. The mitral valve is grossly normal. Trivial mitral valve  regurgitation.   5. The aortic valve is tricuspid. Aortic valve regurgitation is moderate.  Mild aortic valve sclerosis is present, with no evidence of aortic valve  stenosis.   Patient Profile  Jonathan Jonathan [name] is a 60 y.o. male with  CAD (CTO of RCA), ischemic cardiomyopathy (EF 30-35%), hyperlipidemia, chronic venous insufficiency who was admitted on 10/11/2020 for  osteomyelitis of the left foot.  He underwent debridement.  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  He is status post thrombectomy.  Cardiology was consulted Jonathan [name] to EKG showing diffuse ST elevations.  Assessment & Plan   Abnormal EKG, diffuse ST elevation in the anterior lateral and inferior leads -EKG changes have been thought to be related to stroke.  His EKG today again demonstrates this injury pattern.  He has no reciprocal ST depressions to suggest this is an acute ST elevation myocardial infarction. -Troponins negative yesterday. -Echocardiogram is unchanged from prior with EF 30-35%. -The wall motion abnormalities on his echocardiogram showed akinesis of the inferior wall.  This is consistent with his his known CTO of the RCA.  The anterior wall is hypokinetic.  This goes against anterior STEMI as indicated by his EKG. -Overall I agree this is likely stroke related.  No plans for invasive angiography.  He shakes his head he is not in pain.  2.  Ischemic cardiomyopathy, EF 30-35%/CAD with CTO of RCA -Euvolemic on exam.  No history of an ischemic cardiomyopathy with akinesis of the inferior wall.  This is related to his known CTO of the RCA.  Echocardiogram is unchanged from prior. -He appears euvolemic on exam to me. -Neurology is allowing permissive hypertension. -Would recommend he restart his home heart failure regimen when you are able. -He is on aspirin and Plavix Jonathan [name] to recent stroke.  He will continue high intensity statin.  3.  Nonsustained ventricular tachycardia -No sustained episodes.  He is stable.  Maintain potassium above 4 magnesium above 2.  For questions or updates, please contact [place] Please consult [email or web address] for contact info under   Time Spent with Patient: I have spent a total of 35 minutes with patient reviewing hospital notes, telemetry, EKGs, labs and examining the patient as well as establishing an assessment and plan that was discussed with  the patient.  > 50% of time was spent in direct patient care.    Signed, Addison Naegeli. Audie Box, MD, [place]  10/17/2020 10:40 AM

## 2020-10-17 NOTE — Progress Notes (Signed)
STROKE TEAM PROGRESS NOTE   INTERVAL HISTORY His RN and CCM is at the bedside. Case d/w CCM. He is now extubated, following commands. Ready to transfer to floor.  He has expressive aphasia and perseverates on the same idea but not able to speak fluently long sentences.  Seems to follow simple commands well.  There is mild weakness in the right upper extremity moves lower extremities well symmetrical.  CT head yesterday showed left basal ganglia and temporal parietal infarct with minimal petechial hemorrhage. Vitals:   10/17/20 0414 10/17/20 0500 10/17/20 0657 10/17/20 0746  BP: 109/62  (!) 109/52 (!) 110/50  Pulse: 90  93 87  Resp:    19  Temp: 97.7 F (36.5 C)  98 F (36.7 C) 98.1 F (36.7 C)  TempSrc: Oral  Oral Oral  SpO2: 97%  100% 98%  Weight:  64.6 kg    Height:       CBC:  Recent Labs  Lab 10/16/20 0557 10/17/20 0105  WBC 12.0* 7.5  HGB 9.7* 10.0*  HCT 30.8* 30.4*  MCV 78.2* 75.6*  PLT 318 631   Basic Metabolic Panel:  Recent Labs  Lab 10/11/20 0409 10/12/20 0400 10/14/20 0321 10/15/20 0500 10/15/20 2104 10/16/20 0557 10/17/20 0105 10/17/20 0913  NA 133* 137   < > 139   < > 137 137  --   K 3.7 3.6   < > 3.8   < > 4.8 5.7* 3.4*  CL 102 106   < > 109   < > 105 108  --   CO2 25 25   < > 20*   < > 19* 15*  --   GLUCOSE 110* 93   < > 88   < > 63* 92  --   BUN 8 8   < > 7   < > 5* 9  --   CREATININE 0.68  0.55* 0.61   < > 0.62   < > 0.68 0.69  --   CALCIUM 9.2 9.1   < > 9.2   < > 9.4 9.3  --   MG 1.7 1.6*   < > 1.8  --  2.0  --   --   PHOS 2.4* 1.9*  --   --   --   --   --   --    < > = values in this interval not displayed.   Lipid Panel:  Recent Labs  Lab 10/15/20 0500  CHOL 160  TRIG 103  105  HDL 38*  CHOLHDL 4.2  VLDL 21  LDLCALC 101*   HgbA1c:  Recent Labs  Lab 10/15/20 0500  HGBA1C 5.5   Urine Drug Screen:  Recent Labs  Lab 10/15/20 0408  LABOPIA POSITIVE*  COCAINSCRNUR NONE DETECTED  LABBENZ NONE DETECTED  AMPHETMU NONE DETECTED   THCU NONE DETECTED  LABBARB NONE DETECTED    Alcohol Level  Recent Labs  Lab 10/14/20 1853  ETH <10    IMAGING past 24 hours No results found.  PHYSICAL EXAM General: Appears well-developed; no distress Psych: Affect appropriate to situation Eyes: No scleral injection HENT: No OP obstrucion Head: Normocephalic.  Cardiovascular: Normal rate and regular rhythm. Reviewed EKG changes. Respiratory: Effort normal and breath sounds normal to anterior ascultation; Vent GI: Soft.  No distension. There is no tenderness.  Skin: WDI    Neurological Examination Neuro - awake alert, eyes open, orientated to age, place, but not to time. No aphasia, but paucity of speech and intermittent  perseveration, psychomotor slowing but able to name and repeat, following all simple commands. No significant gaze palsy, but on eyeglass with occluder, b/l gaze with end-gaze nystagmus, tracking bilaterally, visual field full, pupils equal size 75mm but sluggish to light. Left mild ptosis, and left mild facial droop. Tongue midline. Bilateral UEs 3/5, no drift. Bilaterally LEs 3/5, no drift. Sensation symmetrical bilaterally subjectively, b/l FTN intact grossly but slow, gait not tested.    ASSESSMENT/PLAN [name] is a 60 y.o. male initially admitted with left foot ulcer and osteo on vanc and zosyn, noted to have acute R sided weakness and aphasia and transferred to [facility] for thrombectomy for potential LMCA M2 occlusion vs high grade stenosis. EKG enroute with ST elevation concerning for STEMI; this was d/w cardiology on call, they reviewed his recent cath and thought the the EKG findings likely from known RCA rather than LAD. Heparin gtt was started.   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 Small vessel disease. Atrophy.  ASPECTS 10.    CTA head & neck LM1 occlusion CT perfusion 12ml penumbra with no core IR s/p thrombectomy with TICI 3 flow F/u [facility] 6/23 without bleed  MRI  LMCA and BG with small petechial hemorrhage  2D Echo 30-35% EF, akinesis of inferior/inferolateral walls, severe LV dysfunction.  Recommend 30 day cardiac event monitoring as outpt to rule out afib LDL 101 HgbA1c 5.5 VTE prophylaxis - lovenox aspirin 81 mg daily prior to admission, now on DAPT  for 3 weeks, then plavix only Therapy recommendations: CIR  disposition:  pending  EKG changes Cardiomyopathy  Cardiology following.  ST-T changes more likely due to stroke, not STEMI After d/w CCM and cards, heparin gtt stopped 10/15/2020  EF 30-35% Home med including lasix, spironolactone, metoprolol, imdur and lisinopril Pt BP on the low end, will resume metoprolol, imdur and lisinopril today Close watch BP and if no significant drop, will consider restart lasix and spironolactone as recommended by cardiology  Unsustained VT Cardiology on board, recommend K > 4.0 and Mg > 2.0 K 3.4, on supplement Check Mg in am  Hypertension Home meds:  imdur, Aldactone, Toprol XL, lasix and lisinopril Stable on the low end Per cardiology, will resume metoprolol, imdur and lisinopril today Close watch BP and if no significant drop, will consider restart lasix and spironolactone later  Hyperlipidemia Home meds:  none  LDL 101, goal < 70 On Lipitor 80mg  Continue statin at discharge  Other Stroke Risk Factors Former Cigarette smoker CAD dCHF  Other Active Problems Developmental delay, Autism spectrum disorder Left foot Wound with Osteomyelitis- on vanco and zyson - duration of abx per primary team  Hospital day # 8  Rosalin Hawking, MD PhD Stroke Neurology 10/17/2020 11:36 AM     To contact Stroke Continuity provider, please refer to [email or web address] After hours, contact General Neurology

## 2020-10-17 NOTE — Plan of Care (Signed)
Called by RN that pt BP dropped after BP meds. I reviewed the medication, noted that Toprol XL was ordered as 100mg . I have changed back to 25mg  home dose. Will continue imdur and lisinopril home meds. Per RN, pt asymptomatic this time. Will continue monitor BP.   Rosalin Hawking, MD PhD Stroke Neurology 10/17/2020 5:12 PM

## 2020-10-17 NOTE — Progress Notes (Signed)
Received a call from Tele stating that this patient had 8 runs of V-Tach. I went in to assess this patient and his assessment was at his baseline and I took a set a vital signs on him and they were normal. I attempted to page Neuro to update them on this new finding and I am waiting their call back.

## 2020-10-17 NOTE — Progress Notes (Signed)
PROGRESS NOTE    Jonathan [name]  KVQ:[id number] DOB: [birth year] DOA: 10/09/2020 PCP: System, Provider Not In   Brief Narrative:  Jonathan [name] is a 60 y.o. male with CAD (CTO of RCA), ischemic cardiomyopathy (EF 30-35%), hyperlipidemia, chronic venous insufficiency who was admitted on 10/11/2020 for osteomyelitis of the left foot.  He underwent debridement.  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  He is status post thrombectomy   Assessment & Plan:   Principal Problem:   Acute osteomyelitis of ankle and foot (HCC) Active Problems:   Rheumatoid arthritis involving multiple sites with positive rheumatoid factor (HCC)   Coronary artery disease involving native coronary artery of native heart   Chronic foot ulcer with fat layer exposed, left (HCC)   Acute ischemic left MCA stroke (HCC)   Ischemic stroke (HCC)   Endotracheal tube present   Ischemic cardiomyopathy   Abnormal finding on EKG  Left foot cellulitis and osteomyelitis: Patient presented with nonhealing left foot ulcer with infection. X-ray and MRI concerning for osteomyelitis. Patient is immunocompromised.   Denies history of diabetes.  Good DP pulses bilaterally.  ABI slightly elevated with triphasic waveforms. S/p fourth metatarsal head excision, irrigation and debridement and graft application by Dr. Cannon Kettle Initially on vanc and zosyn, now on cefazolin 2g q8 for 11 doses, started 6/23 noon and doxycycline IV 165m bid x 7 doses started 6/23 pm Surgical path margins resulted negative ,  PT and OT evaluation  Stroke: left MCA infarct due to left M1 occlusion s/p IR with TICI3, etiology likely due to cardiomyopathy, not able to rule out occult afib, but so far no evidence of endocarditis Code Stroke CT head No acute abnormality. Small vessel disease.  CTA head & neck LM1 occlusion IR s/p thrombectomy [facility] TICI 3 [facility]  MRI  LMCA and BG with small petechial hemorrhage 2D Echo 30-35% EF,  akinesis of inferior/inferolateral walls, severe LV dysfunction. Consider 30 day event monitor LDL 101 HgbA1c 5.5 VTE prophylaxis - lovenox aspirin 81 mg daily prior to admission now on DAPT for 3 weeks then plavix alone Therapy recommendations: CIR disposition:  pending  Hypertension Home meds:  imdur, Aldactone, Toprol XL, lasix and lisinopril Stable on the low end Per cardiology, will resume metoprolol, imdur and lisinopril today Monitor BP and cosnider restarting lasix and spironolactone if stable   Hyperlipidemia Home meds:  none LDL 101, goal < 70 On lipitor 829mdaily C/W statin on d/c  Abnormal EKG, diffuse ST elevation in the anterior lateral and inferior leads -EKG changes have been thought to be related to stroke.  His EKG today again demonstrates this injury pattern.  He has no reciprocal ST depressions to suggest this is an acute ST elevation myocardial infarction. -Troponins negative yesterday. -Echocardiogram is unchanged from prior with EF 30-35%. -The wall motion abnormalities on his echocardiogram showed akinesis of the inferior wall.  This is consistent with his his known CTO of the RCA.  The anterior wall is hypokinetic.  This goes against anterior STEMI as indicated by his EKG. -Overall I agree this is likely stroke related.  No plans for invasive angiography.  He shakes his head he is not in pain.   Ischemic cardiomyopathy, EF 30-35%/CAD with CTO of RCA -Euvolemic on exam.  No history of an ischemic cardiomyopathy with akinesis of the inferior wall.  This is related to his known CTO of the RCA.  Echocardiogram is unchanged from prior. -He appears euvolemic on exam to  me. -Neurology is allowing permissive hypertension. -Would recommend he restart his home heart failure regimen when you are able. -He is on aspirin and Plavix due to recent stroke.  He will continue high intensity statin.   Nonsustained ventricular tachycardia -No sustained episodes.  He is  stable.  Maintain potassium above 4 magnesium above 2.    DVT prophylaxis: Lovenox SQ  Code Status: full    Code Status Orders  (From admission, onward)           Start     Ordered   10/14/20 1853  Full code  Continuous        10/14/20 1854           Code Status History     Date Active Date Inactive Code Status Order ID Comments User Context   10/10/2020 0044 10/14/2020 1855 Full Code [id number]  Elwyn Reach, MD Inpatient   09/26/2020 0827 09/29/2020 1836 Full Code [id number]  Harold Hedge, MD ED   09/26/2020 0827 09/26/2020 0827 Full Code [id number]  Harold Hedge, MD ED   08/21/2019 1637 08/25/2019 0029 Full Code [id number]  Belva Crome, MD Inpatient   08/21/2019 1637 08/21/2019 1637 Full Code [id number]  Flora Lipps, MD Inpatient   12/21/2018 0402 12/23/2018 1731 Full Code [id number]  Shela Leff, MD ED   01/29/2015 1642 02/02/2015 2128 Full Code [id number]  Mcarthur Rossetti, MD Inpatient   11/11/2014 1846 11/14/2014 1830 Full Code [id number]  Mcarthur Rossetti, MD Inpatient   04/01/2013 1015 04/04/2013 2059 Full Code [id number]  Loralie Champagne., PA-C Inpatient   06/07/2011 1808 06/10/2011 1951 Full Code [id number]  Rande Lawman, RN Inpatient      Family Communication:TRIED CALLING VERMEL [name], NA Disposition Plan:  HOME VS SNF Consults called: None Admission status: Inpatient Status is: Inpatient   Remains inpatient appropriate because:Inpatient level of care appropriate due to severity of illness   Dispo: The patient is from: Home              Anticipated d/c is to: Home              Patient currently is not medically stable to d/c.              Difficult to place patient No  Consultants:  NEURO, PCCM, PODIATRY , CARDIOLOGY, VASC SURGERY  Procedures:  DG Abd 1 View  Result Date: 10/14/2020 CLINICAL DATA:  Orogastric tube placement. EXAM: ABDOMEN - 1 VIEW COMPARISON:  None. FINDINGS: Tip and side port of the enteric tube below the diaphragm in  the stomach. Mild gaseous gastric distension. There is excreted IV contrast within the renal collecting systems. Right upper quadrant surgical clips typical of cholecystectomy. IMPRESSION: Tip and side port of the enteric tube below the diaphragm in the stomach. Electronically Signed   By: Keith Rake M.D.   On: 10/14/2020 21:51   CT HEAD WO CONTRAST  Result Date: 10/15/2020 CLINICAL DATA:  Stroke follow-up. Postop percutaneous clot retrieval. EXAM: CT HEAD WITHOUT CONTRAST TECHNIQUE: Contiguous axial images were obtained from the base of the skull through the vertex without intravenous contrast. COMPARISON:  MRI head 10/15/2020.  CT head 10/14/2020 FINDINGS: Brain: Hypodensity in the left basal ganglia compatible with acute infarct. This involves primarily the head of the caudate and putamen. Small amount of hemorrhage in the putamen is best seen on gradient echo imaging. Small acute infarct also in the left temporal lobe. Infarct distribution similar to the recent MRI. Diffusion-weighted  imaging also demonstrated small areas of infarct in the left temporoparietal lobe. No large volume hemorrhage. Ventricle size normal. Mild edema in the head of the caudate with mass-effect on the left frontal horn. No midline shift. Vascular: Negative for hyperdense vessel Skull: Negative Sinuses/Orbits: Mucosal edema and air-fluid levels in the paranasal sinuses. Patient is intubated. 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 Minimal petechial hemorrhage in the left putamen as noted on gradient echo imaging. No large area of hemorrhage. Electronically Signed   By: Franchot Gallo M.D.   On: 10/15/2020 10:22   CT Angio Chest PE W and/or Wo Contrast  Result Date: 09/26/2020 CLINICAL DATA:  Chest pain.  Effusion suspected. EXAM: CT ANGIOGRAPHY CHEST WITH CONTRAST TECHNIQUE: Multidetector CT imaging of the chest was performed using the standard protocol  during bolus administration of intravenous contrast. Multiplanar CT image reconstructions and MIPs were obtained to evaluate the vascular anatomy. CONTRAST:  60m OMNIPAQUE IOHEXOL 350 MG/ML SOLN COMPARISON:  None. FINDINGS: Cardiovascular: Satisfactory opacification of the pulmonary arteries to the proximal segmental level. No evidence of pulmonary embolism. Limited evaluation at the subsegmental level due to respiratory motion artifact. Normal heart size. No pericardial effusion. The thoracic aorta is normal in caliber. Mild atherosclerotic plaque. Mediastinum/Nodes: Bilateral axillary lymphadenopathy. No enlarged mediastinal or hilar lymph nodes. Thyroid gland, trachea, and esophagus demonstrate no significant findings. Lungs/Pleura: Slightly more consolidative airspace opacities at the lung bases. Diffuse, lower lobe predominant, peribronchovascular airspace opacities. Bilateral trace pleural effusions. No pneumothorax. Upper Abdomen: No acute abnormality. Musculoskeletal: No chest wall abnormality. Severe chronic degenerative changes of bilateral shoulders with cortical erosion and destruction of the femoral heads and acetabuli. Question underlying joint effusions. Review of the MIP images confirms the above findings. IMPRESSION: 1. No central or proximal segmental pulmonary embolus with limited evaluation more distally due to motion artifact. 2. Diffuse, lower lobe predominant, peribronchovascular airspace opacities. Bilateral trace pleural effusions. Findings could represent pulmonary edema versus infection/inflammation. 3. Severe chronic degenerative changes of bilateral shoulders with cortical erosion and destruction of the femoral heads and acetabuli. Question underlying joint effusions. 4. Bilateral axillary lymphadenopathy. Electronically Signed   By: MIven FinnM.D.   On: 09/26/2020 03:48   MR BRAIN WO CONTRAST  Result Date: 10/15/2020 CLINICAL DATA:  Stroke follow-up EXAM: MRI HEAD WITHOUT  CONTRAST TECHNIQUE: Multiplanar, multiecho pulse sequences of the brain and surrounding structures were obtained without intravenous contrast. COMPARISON:  Head CT from yesterday FINDINGS: Brain: Confluent restricted diffusion in the left stratum and in the left MCA inferior division cortex and white matter. Petechial hemorrhage is seen at the level of the affected basal ganglia. No hydrocephalus, mass, or collection. Vascular: Preserved flow voids. Skull and upper cervical spine: Normal marrow signal Sinuses/Orbits: Nasopharyngeal fluid in the setting of intubation. Negative orbits. Other: Bilateral parotid ductal dilatation without superimposed acute inflammation. Right mastoid opacification. These findings are considered incidental to the presentation. IMPRESSION: Acute infarction in the left MCA territory affecting lenticulostriates and inferior division cortex/white matter. Mild petechial hemorrhage at the level of the left basal ganglia. Electronically Signed   By: JMonte FantasiaM.D.   On: 10/15/2020 08:45   CARDIAC CATHETERIZATION  Result Date: 09/28/2020 Conclusions: 1. Severe single-vessel coronary artery disease with chronic total occlusion of the mid RCA.  There is mild disease involving the left coronary artery.  Overall appearance is similar to 2019. 2. Mildly to moderately elevated left heart filling pressure. 3. Normal cardiac output/index. Recommendations:  1. Continue medical therapy of acute on chronic HFrEF and ischemic heart disease. 2. [facility] after completion of post-catheterization recovery for ongoing medical management. Nelva Bush, MD [facility]   MR FOOT LEFT W WO CONTRAST  Result Date: 10/10/2020 CLINICAL DATA:  Ulcer between left second and third metatarsals, abnormal x-ray EXAM: MRI OF THE LEFT FOREFOOT WITHOUT AND WITH CONTRAST TECHNIQUE: Multiplanar, multisequence MR imaging of the left foot was performed both before and after administration of  intravenous contrast. CONTRAST:  32m GADAVIST GADOBUTROL 1 MMOL/ML IV SOLN COMPARISON:  10/09/2020, 12/22/2018 FINDINGS: Bones/Joint/Cartilage Evaluation is limited due to excessive patient motion throughout the evaluation. Stable marrow edema is seen at the base of the fifth metatarsal and surrounding the first metatarsophalangeal and interphalangeal joints, most consistent with reactive changes due to arthropathy. Edema is also seen within the head of the fourth metatarsal, immediately adjacent to an area of soft tissue inflammation and ulceration. There is mild associated enhancement. No evidence of cortical disruption or erosion. This area is suspicious for early osteomyelitis. Progressive degenerative changes are seen throughout the left foot. Progressive joint space narrowing and osteophyte formation within the midfoot, metatarsophalangeal joints, and throughout the interphalangeal joints. Ligaments Evaluation of the ligamentous structures is suboptimal due to field of view selection and patient motion during the exam. Muscles and Tendons Evaluation is limited due to patient motion and field of view selection. No gross tendinous disruption. Soft tissues There is soft tissue ulceration, edema, and enhancement in the plantar aspect of the forefoot at the level of the fourth metatarsal head. No fluid collection or abscess. Edema is seen within the musculature surrounding the second and third metatarsals, compatible with reactive changes. No fluid collection or abscess. IMPRESSION: 1. Limited study due to patient motion throughout the exam. 2. Soft tissue ulceration and enhancement within the forefoot at the level of the fourth metatarsal head, with underlying marrow edema and enhancement within the fourth metatarsal. No cortical disruption. Findings favor early osteomyelitis. 3. Reactive edema within the fifth metatarsal and first digit as above, similar to prior study, and likely related to underlying  degenerative change. 4. Nonspecific soft tissue edema within the musculature adjacent to the second and third metatarsals, likely reactive. No fluid collection or abscess. Electronically Signed   By: MRanda NgoM.D.   On: 10/10/2020 00:57   IR CT Head Ltd  Result Date: 10/15/2020 INDICATION: 60year old male with past medical history significant for autism spectrum disorder, RA, systolic CHF, chronic venous stasis with chronic left foot ulcer and recent onychomycosis s/p debridement [facility] Vanc and zosyn. He was found to have acute onset of aphasia and right-sided weakness. His last known well was 1 p.m. on 10/14/2020. Head CT showed no evidence of large acute territorial infarct or hemorrhage. CT angiogram of the head and neck showed a proximal left M2/MCA occlusion. CT perfusion showed a 49 mL penumbra without evidence of a core infarct. NIHSS at presentation was 26; baseline modified Rankin scale is 4 related to autism. No IV tPA administered as she was outside the window[facility] EKG during transport was notable for ST Elevations. Discussed with Dr. HEllyn Hackfrom cardiology who believes the EKG changes are related to neurological process. Decision was made to proceed with mechanical thrombectomy. EXAM: ULTRASOUND-GUIDED VASCULAR ACCESS DIAGNOSTIC CEREBRAL ANGIOGRAM FLAT PANEL HEAD CT COMPARISON:  CT/CT angiogram of the head and  neck October 14, 2020. MEDICATIONS: Not applicable. ANESTHESIA/SEDATION: The procedure was performed under general anesthesia. CONTRAST:  35 mL of Omnipaque 240 milligram/mL FLUOROSCOPY TIME:  Fluoroscopy Time: 24 minutes 42 seconds (541 mGy). COMPLICATIONS: None immediate. TECHNIQUE: Informed written consent was obtained from the patient after a thorough discussion of the procedural risks, benefits and alternatives. All questions were addressed. Maximal  Sterile Barrier Technique was utilized including caps, mask, sterile gowns, sterile gloves, sterile drape, hand hygiene and skin antiseptic. A timeout was performed prior to the initiation of the procedure. The right groin was prepped and draped in the usual sterile fashion. Using a micropuncture kit and the modified Seldinger technique, access was gained to the right common femoral artery and an 8 French sheath was placed. Real-time ultrasound guidance was utilized for vascular access including the acquisition of a permanent ultrasound image documenting patency of the accessed vessel. Under fluoroscopy, a Zoom 88 guide catheter was navigated over a 6 [place] Berenstein 2 catheter and a 0.035" Terumo Glidewire into the aortic arch. The catheter was placed into the left common carotid artery and then advanced into the left internal carotid artery. The inner catheter was removed. Frontal and lateral angiograms of the head were obtained. FINDINGS: 1. Patent right common femoral artery with adequate caliber for vascular access. 2. Severe tortuosity of the cervical left ICA and left carotid siphon. 3. Occlusion of the proximal left M2/MCA posterior division branch. PROCEDURE: Under biplane roadmap, a zoom 55 aspiration catheter was navigated over a phenom 21 microcatheter and a Aristotle 14 microguidewire into the cavernous segment of the left ICA. The microcatheter was then navigated over the wire into the left M3/MCA posterior division branch. Then, a 4 x 40 mm solitaire stent retriever was deployed spanning the M2 segment. The device was allowed to intercalated with the clot for 4 minutes. The microcatheter was removed. The aspiration catheter was advanced to the level of occlusion and connected to a penumbra aspiration pump. The thrombectomy device and aspiration catheter were removed under constant aspiration. Left internal carotid artery angiograms with frontal and lateral views of the head showed complete  recanalization of the left MCA vascular tree. Flat panel CT of the head was obtained and post processed in a separate workstation with concurrent attending physician supervision. Selected images were sent to PACS. No evidence of hemorrhagic complication. A right common femoral artery angiogram in right anterior oblique view was obtained via sheath side port. Atherosclerotic changes of the external iliac and right common femoral artery without hemodynamically significant stenosis. The vessel has adequate caliber for closure device utilization. The femoral sheath was exchanged over the wire for an 8 [place] Angio-Seal which was utilized for access closure. Immediate hemostasis was achieved. IMPRESSION: Successful mechanical thrombectomy performed for treatment of a proximal left M2/MCA posterior division branch occlusion. One pass performed with combined aspiration and stent retriever with complete recanalization (TICI 3). No thromboembolic or hemorrhagic complication. PLAN: Transferred to ICU for continued care. Electronically Signed   By: Pedro Earls M.D.   On: 10/15/2020 13:52   IR US Guide Vasc Access Right  Result Date: 10/15/2020 INDICATION: 60 year old male with past medical history significant for autism spectrum disorder, RA, systolic CHF, chronic venous stasis with chronic left foot ulcer and recent onychomycosis s/p debridement who was admitted to [facility] with left foot ulcer and osteomyelitis on Vanc and zosyn. He was found to have acute onset of aphasia and right-sided weakness. His last known well was 1 p.m.  on 10/14/2020. Head CT showed no evidence of large acute territorial infarct or hemorrhage. CT angiogram of the head and neck showed a proximal left M2/MCA occlusion. CT perfusion showed a 49 mL penumbra without evidence of a core infarct. NIHSS at presentation was 26; baseline modified Rankin scale is 4 related to autism. No IV tPA administered as she was outside the  window. He was transferred to [facility] for mechanical thrombectomy. EKG during transport was notable for ST Elevations. Discussed with Dr. Ellyn Hack from cardiology who believes the EKG changes are related to neurological process. Decision was made to proceed with mechanical thrombectomy. EXAM: ULTRASOUND-GUIDED VASCULAR ACCESS DIAGNOSTIC CEREBRAL ANGIOGRAM FLAT PANEL HEAD CT COMPARISON:  CT/CT angiogram of the head and neck October 14, 2020. MEDICATIONS: Not applicable. ANESTHESIA/SEDATION: The procedure was performed under general anesthesia. CONTRAST:  35 mL of Omnipaque 240 milligram/mL FLUOROSCOPY TIME:  Fluoroscopy Time: 24 minutes 42 seconds (541 mGy). COMPLICATIONS: None immediate. TECHNIQUE: Informed written consent was obtained from the patient after a thorough discussion of the procedural risks, benefits and alternatives. All questions were addressed. Maximal Sterile Barrier Technique was utilized including caps, mask, sterile gowns, sterile gloves, sterile drape, hand hygiene and skin antiseptic. A timeout was performed prior to the initiation of the procedure. The right groin was prepped and draped in the usual sterile fashion. Using a micropuncture kit and the modified Seldinger technique, access was gained to the right common femoral artery and an 8 French sheath was placed. Real-time ultrasound guidance was utilized for vascular access including the acquisition of a permanent ultrasound image documenting patency of the accessed vessel. Under fluoroscopy, a Zoom 88 guide catheter was navigated over a 6 [place] Berenstein 2 catheter and a 0.035" Terumo Glidewire into the aortic arch. The catheter was placed into the left common carotid artery and then advanced into the left internal carotid artery. The inner catheter was removed. Frontal and lateral angiograms of the head were obtained. FINDINGS: 1. Patent right common femoral artery with adequate caliber for vascular access. 2. Severe tortuosity of the  cervical left ICA and left carotid siphon. 3. Occlusion of the proximal left M2/MCA posterior division branch. PROCEDURE: Under biplane roadmap, a zoom 55 aspiration catheter was navigated over a phenom 21 microcatheter and a Aristotle 14 microguidewire into the cavernous segment of the left ICA. The microcatheter was then navigated over the wire into the left M3/MCA posterior division branch. Then, a 4 x 40 mm solitaire stent retriever was deployed spanning the M2 segment. The device was allowed to intercalated with the clot for 4 minutes. The microcatheter was removed. The aspiration catheter was advanced to the level of occlusion and connected to a penumbra aspiration pump. The thrombectomy device and aspiration catheter were removed under constant aspiration. Left internal carotid artery angiograms with frontal and lateral views of the head showed complete recanalization of the left MCA vascular tree. Flat panel CT of the head was obtained and post processed in a separate workstation with concurrent attending physician supervision. Selected images were sent to PACS. No evidence of hemorrhagic complication. A right common femoral artery angiogram in right anterior oblique view was obtained via sheath side port. Atherosclerotic changes of the external iliac and right common femoral artery without hemodynamically significant stenosis. The vessel has adequate caliber for closure device utilization. The femoral sheath was exchanged over the wire for an 8 [place] Angio-Seal which was utilized for access closure. Immediate hemostasis was achieved. IMPRESSION: Successful mechanical thrombectomy performed for treatment of a proximal  left M2/MCA posterior division branch occlusion. One pass performed with combined aspiration and stent retriever with complete recanalization (TICI 3). No thromboembolic or hemorrhagic complication. PLAN: Transferred to ICU for continued care. Electronically Signed   By: Pedro Earls M.D.   On: 10/15/2020 13:52   DG CHEST PORT 1 VIEW  Result Date: 10/15/2020 CLINICAL DATA:  Respiratory failure EXAM: PORTABLE CHEST 1 VIEW COMPARISON:  09/26/2020 FINDINGS: The endotracheal tube is at the carina. Nasogastric tube extends into the upper abdomen beyond the margin of the examination. Lungs are clear. No pneumothorax or pleural effusion. Cardiac size within normal limits. No acute bone abnormality. IMPRESSION: Endotracheal tube at the carina. Withdrawal by roughly 2 cm would better position the catheter within the distal trachea. Electronically Signed   By: Fidela Salisbury MD   On: 10/15/2020 01:55   DG Chest Portable 1 View  Result Date: 09/26/2020 CLINICAL DATA:  Shortness of breath EXAM: PORTABLE CHEST 1 VIEW COMPARISON:  09/07/2020 FINDINGS: Moderate cardiomegaly. Bilateral basilar predominant interstitial opacities. No pleural effusion or pneumothorax. IMPRESSION: Cardiomegaly and bilateral basilar predominant interstitial opacities, likely pulmonary edema. Electronically Signed   By: Ulyses Jarred M.D.   On: 09/26/2020 03:13   DG Foot Complete Left  Result Date: 10/09/2020 CLINICAL DATA:  Infection. Ulcer between left second and third metatarsals. EXAM: LEFT FOOT - COMPLETE 3+ VIEW COMPARISON:  Radiograph 12/21/2018 FINDINGS: Progressive lateral subluxation of the second through fourth digits from prior exam. There is some irregularity of the second and third rays at the metatarsal phalangeal joints. Irregularity about the second metatarsal head has progressed from prior, no frank bony destruction. There is progressive joint space irregularity involving the first metatarsal phalangeal joint with decreased density at the articular surface. Small erosion at the first interphalangeal joint medially. Pes planus and chronic arthropathy of the midfoot with spurring and loss of normal Boehler's angle. Chronic tibial talar degenerative change. Site of wound is not well-defined by  radiograph. There is no radiopaque foreign body. No tracking soft tissue air. IMPRESSION: 1. Articular irregularity of the first, second and third metatarsal phalangeal joints. This appears progressed at the first and second rays from 2020. Given the underlying chronic changes, recommend MRI for more detailed assessment. 2. Small erosion at the first interphalangeal joint. 3. 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 Electronically Signed   By: Keith Rake M.D.   On: 10/09/2020 20:55   VAS [place] ABI WITH/WO TBI  Result Date: 10/10/2020  LOWER EXTREMITY DOPPLER STUDY Patient Name:  Jonathan [name]  Date of Exam:   10/10/2020 Medical Rec #: [id number]         Accession #:    [id number] Date of Birth: 1961/01/22        Patient Gender: M Patient Age:   59Y Exam Location:  [facility] Procedure:      VAS [place] ABI WITH/WO TBI Referring Phys: [id number] TITORYA STOVER --------------------------------------------------------------------------------  Indications: Ulceration, and acute osteomyelitis- LLE ankle & foot. High Risk Factors: Past history of smoking, coronary artery disease. Other Factors: CHF, chronic venous stasis, chronic venous ulcerations.  Vascular Interventions: Venous ablation surgeries. Comparison Study: Segmental Pressure 11/28/18 - WNL Performing Technologist: Rogelia Rohrer RVT, RDMS  Examination Guidelines: A complete evaluation includes at minimum, Doppler waveform signals and systolic blood pressure reading at the level of bilateral brachial, anterior tibial, and posterior tibial arteries, when vessel segments are accessible. Bilateral testing is considered an integral part of a complete examination. Photoelectric Plethysmograph (  PPG) waveforms and toe systolic pressure readings are included as required and additional duplex testing as needed. Limited examinations for reoccurring indications may be performed as noted.  ABI Findings:  +--------+------------------+-----+---------+--------+ Right   Rt Pressure (mmHg)IndexWaveform Comment  +--------+------------------+-----+---------+--------+ Brachial                       triphasicIV       +--------+------------------+-----+---------+--------+ PTA     140               1.28 triphasic         +--------+------------------+-----+---------+--------+ DP      145               1.33 triphasic         +--------+------------------+-----+---------+--------+ +--------+------------------+-----+---------+-------+ Left    Lt Pressure (mmHg)IndexWaveform Comment +--------+------------------+-----+---------+-------+ [id number]                    triphasic        +--------+------------------+-----+---------+-------+ PTA     146               1.34 triphasic        +--------+------------------+-----+---------+-------+ DP      140               1.28 triphasic        +--------+------------------+-----+---------+-------+ +-------+-----------+-----------+------------+------------+ ABI/TBIToday's ABIToday's TBIPrevious ABIPrevious TBI +-------+-----------+-----------+------------+------------+ Right  1.33                  0.98        0.79         +-------+-----------+-----------+------------+------------+ Left   1.34                  1.01        0.75         +-------+-----------+-----------+------------+------------+  Toes bandaged on LLE.  Summary: BLE ABIs are elevated, likely due to vessel calcification. Doppler waveforms are triphasic throughout.  *See table(s) above for measurements and observations  Electronically signed by Harold Barban MD on 10/10/2020 at 11:35:27 PM.    Final    ECHOCARDIOGRAM COMPLETE  Result Date: 09/26/2020    ECHOCARDIOGRAM REPORT   [name] Date of Exam: 09/26/2020 Medical Rec #:  [id number]        Height:       63.0 in Accession #:    [id number]       Weight:       140.0 lb Date of Birth:  [birth year]        BSA:          1.662 m Patient Age:    58 years         BP:           109/60 mmHg Patient Gender: M                HR:           93 bpm. Exam Location:  Inpatient Procedure: 2D Echo, Cardiac Doppler and Color Doppler Indications:    CHF I50.9  History:        Patient has prior history of Echocardiogram examinations, most                 recent 08/22/2019. Previous Myocardial Infarction and CAD.  Sonographer:    Merrie Roof RDCS Referring Phys: [id number] [place]  1. Left ventricular ejection fraction, by estimation, is 30 to 35%.  The left ventricle has moderately decreased function. The left ventricle demonstrates regional wall motion abnormalities. Inferior/inferoseptal akinesis. The left ventricular internal cavity size was mildly dilated. Left ventricular diastolic parameters are consistent with Grade II diastolic dysfunction (pseudonormalization). Elevated left atrial pressure.  2. Right ventricular systolic function is normal. The right ventricular size is normal. Tricuspid regurgitation signal is inadequate for assessing PA pressure.  3. Left atrial size was mildly dilated.  4. The aortic valve was not well visualized. Aortic valve regurgitation is mild to moderate. Eccentric posterior directed AI jet, visually appears mild but may be underestimating given eccentric jet, moderate by PHT. No aortic stenosis is present.  5. The inferior vena cava is normal in size with greater than 50% respiratory variability, suggesting right atrial pressure of 3 mmHg.  6. The mitral valve is normal in structure. No evidence of mitral valve regurgitation. FINDINGS  Left Ventricle: Left ventricular ejection fraction, by estimation, is 30 to 35%. The left ventricle has moderately decreased function. The left ventricle demonstrates regional wall motion abnormalities. The left ventricular internal cavity size was mildly dilated. There is no left ventricular hypertrophy. Left ventricular diastolic parameters are  consistent with Grade II diastolic dysfunction (pseudonormalization). Elevated left atrial pressure. Right Ventricle: The right ventricular size is normal. No increase in right ventricular wall thickness. Right ventricular systolic function is normal. Tricuspid regurgitation signal is inadequate for assessing PA pressure. Left Atrium: Left atrial size was mildly dilated. Right Atrium: Right atrial size was normal in size. Pericardium: Trivial pericardial effusion is present. Mitral Valve: The mitral valve is normal in structure. No evidence of mitral valve regurgitation. Tricuspid Valve: The tricuspid valve is normal in structure. Tricuspid valve regurgitation is trivial. Aortic Valve: The aortic valve was not well visualized. Aortic valve regurgitation is mild to moderate. No aortic stenosis is present. Pulmonic Valve: The pulmonic valve was not well visualized. Pulmonic valve regurgitation is not visualized. Aorta: The aortic root is normal in size and structure. Venous: The inferior vena cava is normal in size with greater than 50% respiratory variability, suggesting right atrial pressure of 3 mmHg. IAS/Shunts: The interatrial septum was not well visualized.  LEFT VENTRICLE PLAX 2D LVIDd:         5.90 cm      Diastology LVIDs:         4.80 cm      LV e' medial:    5.33 cm/s LV PW:         0.80 cm      LV E/e' medial:  16.3 LV IVS:        0.60 cm      LV e' lateral:   5.77 cm/s LVOT diam:     2.10 cm      LV E/e' lateral: 15.1 LV SV:         58 LV SV Index:   35 LVOT Area:     3.46 cm  LV Volumes (MOD) LV vol d, MOD A2C: 197.0 ml LV vol d, MOD A4C: 205.0 ml LV vol s, MOD A2C: 147.0 ml LV vol s, MOD A4C: 120.5 ml LV SV MOD A2C:     50.0 ml LV SV MOD A4C:     205.0 ml LV SV MOD BP:      67.1 ml RIGHT VENTRICLE RV Basal diam:  3.70 cm LEFT ATRIUM             Index       RIGHT ATRIUM  Index LA diam:        3.70 cm 2.23 cm/m  RA Area:     18.10 cm LA Vol (A2C):   77.3 ml 46.52 ml/m RA Volume:   46.90 ml   28.22 ml/m LA Vol (A4C):   46.6 ml 28.04 ml/m LA Biplane Vol: 62.2 ml 37.43 ml/m  AORTIC VALVE LVOT Vmax:   88.80 cm/s LVOT Vmean:  56.500 cm/s LVOT VTI:    0.168 m  AORTA Ao Root diam: 3.10 cm MITRAL VALVE MV Area (PHT): 6.12 cm    SHUNTS MV Decel Time: 124 msec    Systemic VTI:  0.17 m MV E velocity: 87.00 cm/s  Systemic Diam: 2.10 cm MV A velocity: 94.70 cm/s MV E/A ratio:  0.92 Oswaldo Milian MD Electronically signed by Oswaldo Milian MD Signature Date/Time: 09/26/2020/2:24:59 PM    Final    IR PERCUTANEOUS ART THROMBECTOMY/INFUSION INTRACRANIAL INC DIAG ANGIO  Result Date: 10/15/2020 INDICATION: 60 year old male with past medical history significant for autism spectrum disorder, RA, systolic CHF, chronic venous stasis with chronic left foot ulcer and recent onychomycosis s/p debridement who was admitted to [facility] with left foot ulcer and osteomyelitis on Vanc and zosyn. He was found to have acute onset of aphasia and right-sided weakness. His last known well was 1 p.m. on 10/14/2020. Head CT showed no evidence of large acute territorial infarct or hemorrhage. CT angiogram of the head and neck showed a proximal left M2/MCA occlusion. CT perfusion showed a 49 mL penumbra without evidence of a core infarct. NIHSS at presentation was 26; baseline modified Rankin scale is 4 related to autism. No IV tPA administered as she was outside the window. He was transferred to [facility] for mechanical thrombectomy. EKG during transport was notable for ST Elevations. Discussed with Dr. Ellyn Hack from cardiology who believes the EKG changes are related to neurological process. Decision was made to proceed with mechanical thrombectomy. EXAM: ULTRASOUND-GUIDED VASCULAR ACCESS DIAGNOSTIC CEREBRAL ANGIOGRAM FLAT PANEL HEAD CT COMPARISON:  CT/CT angiogram of the head and neck October 14, 2020. MEDICATIONS: Not applicable. ANESTHESIA/SEDATION: The procedure was performed under general anesthesia. CONTRAST:   35 mL of Omnipaque 240 milligram/mL FLUOROSCOPY TIME:  Fluoroscopy Time: 24 minutes 42 seconds (541 mGy). COMPLICATIONS: None immediate. TECHNIQUE: Informed written consent was obtained from the patient after a thorough discussion of the procedural risks, benefits and alternatives. All questions were addressed. Maximal Sterile Barrier Technique was utilized including caps, mask, sterile gowns, sterile gloves, sterile drape, hand hygiene and skin antiseptic. A timeout was performed prior to the initiation of the procedure. The right groin was prepped and draped in the usual sterile fashion. Using a micropuncture kit and the modified Seldinger technique, access was gained to the right common femoral artery and an 8 French sheath was placed. Real-time ultrasound guidance was utilized for vascular access including the acquisition of a permanent ultrasound image documenting patency of the accessed vessel. Under fluoroscopy, a Zoom 88 guide catheter was navigated over a 6 [place] Berenstein 2 catheter and a 0.035" Terumo Glidewire into the aortic arch. The catheter was placed into the left common carotid artery and then advanced into the left internal carotid artery. The inner catheter was removed. Frontal and lateral angiograms of the head were obtained. FINDINGS: 1. Patent right common femoral artery with adequate caliber for vascular access. 2. Severe tortuosity of the cervical left ICA and left carotid siphon. 3. Occlusion of the proximal left M2/MCA posterior division branch. PROCEDURE: Under biplane roadmap, a  zoom 55 aspiration catheter was navigated over a phenom 21 microcatheter and a Aristotle 14 microguidewire into the cavernous segment of the left ICA. The microcatheter was then navigated over the wire into the left M3/MCA posterior division branch. Then, a 4 x 40 mm solitaire stent retriever was deployed spanning the M2 segment. The device was allowed to intercalated with the clot for 4 minutes. The  microcatheter was removed. The aspiration catheter was advanced to the level of occlusion and connected to a penumbra aspiration pump. The thrombectomy device and aspiration catheter were removed under constant aspiration. Left internal carotid artery angiograms with frontal and lateral views of the head showed complete recanalization of the left MCA vascular tree. Flat panel CT of the head was obtained and post processed in a separate workstation with concurrent attending physician supervision. Selected images were sent to PACS. No evidence of hemorrhagic complication. A right common femoral artery angiogram in right anterior oblique view was obtained via sheath side port. Atherosclerotic changes of the external iliac and right common femoral artery without hemodynamically significant stenosis. The vessel has adequate caliber for closure device utilization. The femoral sheath was exchanged over the wire for an 8 [place] Angio-Seal which was utilized for access closure. Immediate hemostasis was achieved. IMPRESSION: Successful mechanical thrombectomy performed for treatment of a proximal left M2/MCA posterior division branch occlusion. One pass performed with combined aspiration and stent retriever with complete recanalization (TICI 3). No thromboembolic or hemorrhagic complication. PLAN: Transferred to ICU for continued care. Electronically Signed   By: Pedro Earls M.D.   On: 10/15/2020 13:52   CT HEAD CODE STROKE WO CONTRAST`  Result Date: 10/14/2020 CLINICAL DATA:  Code stroke. Right facial droop. Right-sided weakness. EXAM: CT HEAD WITHOUT CONTRAST TECHNIQUE: Contiguous axial images were obtained from the base of the skull through the vertex without intravenous contrast. COMPARISON:  None. FINDINGS: Brain: Abnormal edema and loss of gray-white differentiation in the left basal ganglia (including the caudate and lentiform nucleus), concerning for acute or subacute infarct. Mild associated  mass effect with approximately 2 mm of rightward midline shift at the foramen of Monro. Vascular: No hyperdense vessel identified. Calcific atherosclerosis. Skull: No acute fracture. Sinuses/Orbits: Clear visualized sinuses.  Unremarkable orbits. Other: Small right mastoid effusion. ASPECTS [facility] Stroke Program Early CT Score) - Ganglionic level infarction (caudate, lentiform nuclei, internal capsule, insula, M1-M3 cortex): 5 - Supraganglionic infarction (M4-M6 cortex): 3 Total score (0-10 with 10 being normal): 8 IMPRESSION: 1. Abnormal edema and loss of gray-white differentiation in the left basal ganglia (including the caudate and lentiform nucleus), concerning for acute or subacute infarct. Recommend MRI to confirm and exclude other etiologies. 2. Mild associated mass effect with approximately 2 mm of rightward midline shift at the foramen of Monro. 3. No acute hemorrhage. Findings discussed with Dr. Dwyane Dee via telephone at 5:18 p.m. Electronically Signed   By: Margaretha Sheffield MD   On: 10/14/2020 17:22   ECHOCARDIOGRAM LIMITED  Result Date: 10/15/2020    ECHOCARDIOGRAM LIMITED REPORT   [name] Date of Exam: 10/15/2020 Medical Rec #:  [id number]        Height:       63.0 in Accession #:    [id number]       Weight:       138.9 lb Date of Birth:  [birth year]       BSA:          1.656 m Patient Age:    75  years         BP:           130/61 mmHg Patient Gender: M                HR:           78 bpm. Exam Location:  Inpatient Procedure: Limited Echo, Limited Color Doppler and Cardiac Doppler Indications:    stroke. coronary artery disease.  History:        Patient has prior history of Echocardiogram examinations, most                 recent 09/26/2020. CHF, CAD; Risk Factors:Dyslipidemia.  Sonographer:    Johny Chess Referring Phys: [id number] [place]  Sonographer Comments: Echo performed with patient supine and on artificial respirator. IMPRESSIONS  1. Akinesis of the inferior and  inferolateral walls; remaining walls hypokinetic; overall moderate to severe LV dysfunction.  2. Left ventricular ejection fraction, by estimation, is 30 to 35%. The left ventricle has moderate to severely decreased function. The left ventricle demonstrates regional wall motion abnormalities (see scoring diagram/findings for description). The left ventricular internal cavity size was mildly dilated. Left ventricular diastolic parameters are consistent with Grade II diastolic dysfunction (pseudonormalization). Elevated left atrial pressure.  3. Right ventricular systolic function is normal. The right ventricular size is normal.  4. The mitral valve is grossly normal. Trivial mitral valve regurgitation.  5. The aortic valve is tricuspid. Aortic valve regurgitation is moderate. Mild aortic valve sclerosis is present, with no evidence of aortic valve stenosis. FINDINGS  Left Ventricle: Left ventricular ejection fraction, by estimation, is 30 to 35%. The left ventricle has moderate to severely decreased function. The left ventricle demonstrates regional wall motion abnormalities. The left ventricular internal cavity size was mildly dilated. There is no left ventricular hypertrophy. Left ventricular diastolic parameters are consistent with Grade II diastolic dysfunction (pseudonormalization). Elevated left atrial pressure. Right Ventricle: The right ventricular size is normal. Right ventricular systolic function is normal. Left Atrium: Left atrial size was normal in size. Right Atrium: Right atrial size was normal in size. Pericardium: There is no evidence of pericardial effusion. Mitral Valve: The mitral valve is grossly normal. There is mild thickening of the mitral valve leaflet(s). Trivial mitral valve regurgitation. Tricuspid Valve: The tricuspid valve is normal in structure. Tricuspid valve regurgitation is trivial. Aortic Valve: The aortic valve is tricuspid. Aortic valve regurgitation is moderate. Aortic  regurgitation PHT measures 260 msec. Mild aortic valve sclerosis is present, with no evidence of aortic valve stenosis. Pulmonic Valve: The pulmonic valve was grossly normal. Pulmonic valve regurgitation is trivial. IAS/Shunts: The interatrial septum was not well visualized. Additional Comments: Akinesis of the inferior and inferolateral walls; remaining walls hypokinetic; overall moderate to severe LV dysfunction. LEFT VENTRICLE PLAX 2D LVIDd:         5.40 cm  Diastology LVIDs:         5.00 cm  LV e' medial:    5.00 cm/s LV PW:         1.00 cm  LV E/e' medial:  26.2 LV IVS:        0.90 cm  LV e' lateral:   7.18 cm/s LVOT diam:     2.30 cm  LV E/e' lateral: 18.2 LV SV:         93 LV SV Index:   56 LVOT Area:     4.15 cm  IVC IVC diam: 1.90 cm LEFT ATRIUM  Index LA diam:    3.30 cm 1.99 cm/m  AORTIC VALVE LVOT Vmax:   125.00 cm/s LVOT Vmean:  78.400 cm/s LVOT VTI:    0.223 m AI PHT:      260 msec  AORTA Ao Root diam: 3.20 cm Ao Asc diam:  3.00 cm MITRAL VALVE MV Area (PHT): 4.60 cm     SHUNTS MV Decel Time: 165 msec     Systemic VTI:  0.22 m MV E velocity: 131.00 cm/s  Systemic Diam: 2.30 cm Kirk Ruths MD Electronically signed by Kirk Ruths MD Signature Date/Time: 10/15/2020/9:20:08 AM    Final    CT ANGIO HEAD NECK W WO CM W PERF (CODE STROKE)  Result Date: 10/14/2020 CLINICAL DATA:  Neuro deficit, acute stroke suspected. EXAM: CT ANGIOGRAPHY HEAD AND NECK CT PERFUSION BRAIN TECHNIQUE: Multidetector CT imaging of the head and neck was performed using the standard protocol during bolus administration of intravenous contrast. Multiplanar CT image reconstructions and MIPs were obtained to evaluate the vascular anatomy. Carotid stenosis measurements (when applicable) are obtained utilizing NASCET criteria, using the distal internal carotid diameter as the denominator. Multiphase CT imaging of the brain was performed following IV bolus contrast injection. Subsequent parametric perfusion maps were  calculated using RAPID software. CONTRAST:  16m OMNIPAQUE IOHEXOL 350 MG/ML SOLN COMPARISON:  None. FINDINGS: CTA NECK FINDINGS Aortic arch: Great vessel origins are patent. Mild narrowing of the left subclavian artery origin. Right carotid system: No evidence of dissection, stenosis (50% or greater) or occlusion. Tortuous ICA with retropharyngeal course. Mild atherosclerosis at the carotid bifurcation. Left carotid system: No evidence of dissection, stenosis (50% or greater) or occlusion. Mild atherosclerosis at the carotid bifurcation. Vertebral arteries: Right dominant. The non dominant left vertebral artery is small throughout its course with small transverse foramen. Right vertebral artery is patent without significant (greater than 50%) stenosis. Skeleton: Other neck: Enlarged bilateral parotid ducts, left greater than right without definite radiodense calculus, although evaluation is limited on the CTA. Upper chest: Layering bilateral pleural effusions in the visualized lung apices with overlying ground-glass opacities Review of the MIP images confirms the above findings CTA HEAD FINDINGS Anterior circulation: Bilateral ICAs are patent with calcific atherosclerosis, but no significant (greater than 50%) stenosis. Left M1 MCAs patent. Occlusion versus severe stenosis of a proximal left M2 MCA branch (see series 8, images 121 through 123; series 10, images 120 5 through 128) with irregular distal opacification. Right MCA and bilateral ACAs are patent. No aneurysm identified. Posterior circulation: Bilateral intradural vertebral arteries and the basilar artery are patent. Bilateral fetal type PCAs without evidence of proximal hemodynamically significant PCA stenosis. Limited evaluation of the distal PCAs due to venous contamination. No aneurysm identified. Venous sinuses: As permitted by contrast timing, patent. Anatomic variants: See above. Review of the MIP images confirms the above findings CT Brain  Perfusion Findings: ASPECTS: 8 CBF (<30%) Volume: 057mPerfusion (Tmax>6.0s) volume: 4947mismatch Volume: 64m37mhe amount of penumbra may be underestimated given the more superior left MCA territory is not imaged on perfusion Infarction Location:None identified IMPRESSION: 1. 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 Associated 49 mL of penumbra in the left MCA territory. The volume of penumbra may be underestimated given the more superior left MCA territory is not imaged on perfusion. No evidence of core infarct on RAPID analysis. 2. Layering bilateral pleural effusions in the visualized lung apices with overlying ground-glass opacities, potentially atelectasis. Recommend dedicated  chest imaging to further characterize. 3. Significantly enlarged bilateral parotid ducts, left greater than right without definite radiodense calculus, although evaluation is limited on this CTA. Bilateral overlying subcutaneous stranding could represent anasarca, although parotiditis is not excluded. Recommend clinical correlation and consideration of follow-up CT neck (preferably with contrast) after resolution of the patient's emergent issues. Critical findings discussed with Dr. Quinn Axe At 5:44 p.m. via telephone. Electronically Signed   By: Margaretha Sheffield MD   On: 10/14/2020 18:14    Antimicrobials:  DOXY AND CEFAZOLIN AS ABOVE   Subjective: RESTING COMOFTABLY IN BED CURRNETLY, NO COMPLANTS, LIMITED COMMUNICATION  Objective: Vitals:   10/17/20 0500 10/17/20 0657 10/17/20 0746 10/17/20 1242  BP:  (!) 109/52 (!) 110/50 121/61  Pulse:  93 87 85  Resp:   19 19  Temp:  98 F (36.7 C) 98.1 F (36.7 C) 98.3 F (36.8 C)  TempSrc:  Oral Oral Oral  SpO2:  100% 98% 98%  Weight: 64.6 kg     Height:        Intake/Output Summary (Last 24 hours) at 10/17/2020 1530 Last data filed at 10/17/2020 0500 Gross per 24 hour  Intake --  Output  550 ml  Net -550 ml   Filed Weights   10/15/20 0500 10/16/20 0415 10/17/20 0500  Weight: 63 kg 63.6 kg 64.6 kg    Examination:  General: Well nourished, well developed, in no acute distress Head: Atraumatic, normal size Eyes: PEERLA, EOMI Neck: Supple, no JVD Endocrine: No thryomegaly Cardiac: Normal S1, S2; RRR; no murmurs, rubs, or gallops Lungs: Clear to auscultation bilaterally, no wheezing, rhonchi or rales Abd: Soft, nontender, no hepatomegaly Ext: No edema Musculoskeletal: No deformities Neuro: Alert, awake, expressive aphasia noted    Data Reviewed: I have personally reviewed following labs and imaging studies  CBC: Recent Labs  Lab 10/12/20 0400 10/14/20 0321 10/14/20 2157 10/15/20 0500 10/16/20 0557 10/17/20 0105  WBC 10.1 7.0  --  6.1 12.0* 7.5  HGB 9.2* 8.5* 8.2* 9.1* 9.7* 10.0*  HCT 29.9* 27.5* 24.0* 29.4* 30.8* 30.4*  MCV 78.7* 78.8*  --  79.0* 78.2* 75.6*  PLT 277 284  --  312 318 627   Basic Metabolic Panel: Recent Labs  Lab 10/11/20 0409 10/12/20 0400 10/14/20 0321 10/14/20 2157 10/15/20 0500 10/15/20 2104 10/16/20 0557 10/17/20 0105 10/17/20 0913  NA 133* 137 137 139 139 133* 137 137  --   K 3.7 3.6 4.3 3.8 3.8 4.6 4.8 5.7* 3.4*  CL 102 106 108  --  109 103 105 108  --   CO2 _0 --  20* 13* 19* 15*  --   GLUCOSE 110* 93 107*  --  88 65* 63* 92  --   BUN _1 --  7 5* 5* 9  --   CREATININE 0.68  0.55* 0.61 0.53*  --  0.62 0.67 0.68 0.69  --   CALCIUM 9.2 9.1 9.1  --  9.2 9.2 9.4 9.3  --   MG 1.7 1.6* 1.8  --  1.8  --  2.0  --   --   PHOS 2.4* 1.9*  --   --   --   --   --   --   --    GFR: Estimated Creatinine Clearance: 80 mL/min (by C-G formula based on SCr of 0.69 mg/dL). Liver Function Tests: Recent Labs  Lab 10/11/20 0409 10/12/20 0400 10/15/20 0500  AST  --   --  30  ALT  --   --  21  ALKPHOS  --   --  69  BILITOT  --   --  0.8  PROT  --   --  6.1*  ALBUMIN 2.9* 2.5* 2.4*   No results for input(s): LIPASE,  AMYLASE in the last 168 hours. No results for input(s): AMMONIA in the last 168 hours. Coagulation Profile: No results for input(s): INR, PROTIME in the last 168 hours. Cardiac Enzymes: No results for input(s): CKTOTAL, CKMB, CKMBINDEX, TROPONINI in the last 168 hours. BNP (last 3 results) No results for input(s): PROBNP in the last 8760 hours. HbA1C: Recent Labs    10/15/20 0500  HGBA1C 5.5   CBG: Recent Labs  Lab 10/14/20 1658 10/17/20 0745  GLUCAP 115* 91   Lipid Profile: Recent Labs    10/15/20 0500  CHOL 160  HDL 38*  LDLCALC 101*  TRIG 103  105  CHOLHDL 4.2   Thyroid Function Tests: No results for input(s): TSH, T4TOTAL, FREET4, T3FREE, THYROIDAB in the last 72 hours. Anemia Panel: No results for input(s): VITAMINB12, FOLATE, FERRITIN, TIBC, IRON, RETICCTPCT in the last 72 hours. Sepsis Labs: No results for input(s): PROCALCITON, LATICACIDVEN in the last 168 hours.  Recent Results (from the past 240 hour(s))  SARS CORONAVIRUS 2 (TAT 6-24 HRS) Nasopharyngeal Nasopharyngeal Swab     Status: None   Collection Time: 10/09/20  9:57 PM   Specimen: Nasopharyngeal Swab  Result Value Ref Range Status   SARS Coronavirus 2 NEGATIVE NEGATIVE Final    Comment: (NOTE) SARS-CoV-2 target nucleic acids are NOT DETECTED.  The SARS-CoV-2 RNA is generally detectable in upper and lower respiratory specimens during the acute phase of infection. Negative results do not preclude SARS-CoV-2 infection, do not rule out co-infections with other pathogens, and should not be used as the sole basis for treatment or other patient management decisions. Negative results must be combined with clinical observations, patient history, and epidemiological information. The expected result is Negative.  Fact Sheet for Patients: SugarRoll.be  Fact Sheet for Healthcare Providers: https://www.woods-mathews.com/  This test is not yet approved or cleared  by the [place] FDA and  has been authorized for detection and/or diagnosis of SARS-CoV-2 by FDA under an Emergency Use Authorization (EUA). This EUA will remain  in effect (meaning this test can be used) for the duration of the COVID-19 declaration under Se ction 564(b)(1) of the Act, 21 U.S.C. section 360bbb-3(b)(1), unless the authorization is terminated or revoked sooner.  Performed at [facility], [place] [address], [place], [place] [address]   Surgical pcr screen     Status: None   Collection Time: 10/10/20  3:34 PM   Specimen: Nasal Mucosa; Nasal Swab  Result Value Ref Range Status   MRSA, PCR NEGATIVE NEGATIVE Final   Staphylococcus aureus NEGATIVE NEGATIVE Final    Comment: (NOTE) The Xpert SA Assay (FDA approved for NASAL specimens in patients 27 years of age and older), is one component of a comprehensive surveillance program. It is not intended to diagnose infection nor to guide or monitor treatment. Performed at [facility], [place] [address] [place], [place] [address]   Aerobic/Anaerobic Culture w Gram Stain (surgical/deep wound)     Status: None (Preliminary result)   Collection Time: 10/11/20 11:06 AM   Specimen: Wound  Result Value Ref Range Status   Specimen Description   Final    WOUND LEFT FOOT Performed at [facility] [address] [place],  [place] [address]    Special Requests   Final    PATIENT ON FOLLOWING Cephas Darby Performed at [place], [place] [address], [place], [place] [address]    Gram Stain   Final    NO WBC SEEN RARE GRAM POSITIVE COCCI Performed at [facility], [place] [address] [place], [place] [address]    Culture   Final    RARE PROTEUS MIRABILIS RARE STAPHYLOCOCCUS COHNII NO ANAEROBES ISOLATED; CULTURE IN PROGRESS FOR 5 DAYS    Report Status PENDING  Incomplete   Organism ID, Bacteria PROTEUS MIRABILIS  Final   Organism ID, Bacteria STAPHYLOCOCCUS COHNII   Final      Susceptibility   Proteus mirabilis - MIC*    AMPICILLIN <=2 SENSITIVE Sensitive     CEFAZOLIN <=4 SENSITIVE Sensitive     CEFEPIME <=0.12 SENSITIVE Sensitive     CEFTAZIDIME <=1 SENSITIVE Sensitive     CEFTRIAXONE <=0.25 SENSITIVE Sensitive     CIPROFLOXACIN <=0.25 SENSITIVE Sensitive     GENTAMICIN <=1 SENSITIVE Sensitive     IMIPENEM 2 SENSITIVE Sensitive     TRIMETH/SULFA <=20 SENSITIVE Sensitive     AMPICILLIN/SULBACTAM <=2 SENSITIVE Sensitive     PIP/TAZO <=4 SENSITIVE Sensitive     * RARE PROTEUS MIRABILIS   Staphylococcus cohnii - MIC*    CIPROFLOXACIN <=0.5 SENSITIVE Sensitive     ERYTHROMYCIN 0.5 SENSITIVE Sensitive     GENTAMICIN <=0.5 SENSITIVE Sensitive     OXACILLIN 0.5 RESISTANT Resistant     TETRACYCLINE <=1 SENSITIVE Sensitive     VANCOMYCIN 1 SENSITIVE Sensitive     TRIMETH/SULFA <=10 SENSITIVE Sensitive     CLINDAMYCIN <=0.25 SENSITIVE Sensitive     RIFAMPIN <=0.5 SENSITIVE Sensitive     Inducible Clindamycin NEGATIVE Sensitive     * RARE STAPHYLOCOCCUS COHNII  MRSA Next Gen by PCR, Nasal     Status: None   Collection Time: 10/14/20  8:55 PM   Specimen: Nasal Mucosa; Nasal Swab  Result Value Ref Range Status   MRSA by PCR Next Gen NOT DETECTED NOT DETECTED Final    Comment: (NOTE) The GeneXpert MRSA Assay (FDA approved for NASAL specimens only), is one component of a comprehensive MRSA colonization surveillance program. It is not intended to diagnose MRSA infection nor to guide or monitor treatment for MRSA infections. Test performance is not FDA approved in patients less than 8 years old. Performed at [facility], [place] [address] [place], [place] [address]          Radiology Studies: No results found.      Scheduled Meds:  aspirin  81 mg Oral Daily   atorvastatin  80 mg Oral Daily   clopidogrel  75 mg Oral Daily   enoxaparin (LOVENOX) injection  40 mg Subcutaneous Q24H   gabapentin  300 mg Oral BID   isosorbide  mononitrate  60 mg Oral Daily   lisinopril  10 mg Oral Daily   mouth rinse  15 mL Mouth Rinse BID   metoprolol succinate  100 mg Oral Daily   montelukast  10 mg Oral QHS   pantoprazole  40 mg Oral Daily   potassium chloride  40 mEq Oral Q4H   predniSONE  5 mg Oral BID WC   Continuous Infusions:   ceFAZolin (ANCEF) IV 2 g (10/17/20 1434)   doxycycline (VIBRAMYCIN) IV 100 mg (10/17/20 0946)     LOS: 8 days    Time spent: 55 MIN  Nicolette Bang, MD [facility]  If 7PM-7AM, please contact night-coverage  10/17/2020, 3:30 PM

## 2020-10-18 LAB — BASIC METABOLIC PANEL
Anion gap: 7 (ref 5–15)
BUN: 6 mg/dL (ref 6–20)
CO2: 22 mmol/L (ref 22–32)
Calcium: 9.1 mg/dL (ref 8.9–10.3)
Chloride: 107 mmol/L (ref 98–111)
Creatinine, Ser: 0.55 mg/dL — ABNORMAL LOW (ref 0.61–1.24)
GFR, Estimated: >60 mL/min (ref >60)
Glucose, Bld: 115 mg/dL — ABNORMAL HIGH (ref 70–99)
Potassium: 4.1 mmol/L (ref 3.5–5.1)
Sodium: 136 mmol/L (ref 135–145)

## 2020-10-18 LAB — CBC
HCT: 30 % — ABNORMAL LOW (ref 39.0–52.0)
Hemoglobin: 9.4 g/dL — ABNORMAL LOW (ref 13.0–17.0)
MCH: 24.5 pg — ABNORMAL LOW (ref 26.0–34.0)
MCHC: 31.3 g/dL (ref 30.0–36.0)
MCV: 78.3 fL — ABNORMAL LOW (ref 80.0–100.0)
Platelets: 310 10*3/uL (ref 150–400)
RBC: 3.83 MIL/uL — ABNORMAL LOW (ref 4.22–5.81)
RDW: 16.7 % — ABNORMAL HIGH (ref 11.5–15.5)
WBC: 6.1 10*3/uL (ref 4.0–10.5)
nRBC: 0 % (ref 0.0–0.2)

## 2020-10-18 LAB — MAGNESIUM: Magnesium: 1.7 mg/dL (ref 1.7–2.4)

## 2020-10-18 MED ORDER — ISOSORBIDE MONONITRATE ER 60 MG PO TB24: 60.0000 mg | ORAL_TABLET | Freq: Every evening | ORAL | Status: DC

## 2020-10-18 MED ORDER — METOPROLOL SUCCINATE ER 25 MG PO TB24: 25.0000 mg | ORAL_TABLET | Freq: Every evening | ORAL | Status: DC

## 2020-10-18 MED ORDER — MAGNESIUM SULFATE 2 GM/50ML IV SOLN
2.0000 g | Freq: Once | INTRAVENOUS | Status: AC
  Administered 2020-10-18: 2 g via INTRAVENOUS
  Filled 2020-10-18: qty 50

## 2020-10-18 MED ORDER — LISINOPRIL 2.5 MG PO TABS
5.0000 mg | ORAL_TABLET | Freq: Every day | ORAL | Status: DC
  Administered 2020-10-19: 5 mg via ORAL
  Filled 2020-10-18: qty 2

## 2020-10-18 MED ORDER — SPIRONOLACTONE 25 MG PO TABS
25.0000 mg | ORAL_TABLET | Freq: Every day | ORAL | Status: DC
  Administered 2020-10-18 – 2020-10-19 (×2): 25 mg via ORAL
  Filled 2020-10-18 (×2): qty 1

## 2020-10-18 NOTE — Progress Notes (Signed)
PROGRESS NOTE    Jonathan [name]  HDQ:[id number] DOB: 02/16/1961 DOA: 10/09/2020 PCP: System, Provider Not In    Brief Narrative:  60 year old male, initially admitted to the hospital with infected foot ulcer.  Seen by podiatry and underwent operative management.  Hospital course further complicated by development of acute left MCA infarct.  He underwent mechanical thrombectomy.  Seen by physical therapy with recommendations for CIR.  He is otherwise stable for discharge.   Assessment & Plan:   Principal Problem:   Acute osteomyelitis of ankle and foot (HCC) Active Problems:   Rheumatoid arthritis involving multiple sites with positive rheumatoid factor (HCC)   Coronary artery disease involving native coronary artery of native heart   Chronic foot ulcer with fat layer exposed, left (HCC)   Acute ischemic left MCA stroke (HCC)   Ischemic stroke (HCC)   Endotracheal tube present   Ischemic cardiomyopathy   Abnormal finding on EKG   Acute left MCA infarct due to left M1 occlusion -Status post mechanical thrombectomy -Neurology has signed off -LDL 101, continue Lipitor -A1c 5.5 -Continue dual antiplatelet therapy with aspirin and Plavix for 3 weeks, followed by Plavix alone -2D echo shows ejection fraction of 30 to 35% -Neurology has recommended 30-day cardiac event monitor on discharge to rule out occult A. Fib -Seen by physical therapy with recommendation for CIR -Discussed with family and they would like to pursue CIR  EKG changes Cardiomyopathy -Initial concerns for possible STEMI on EKG due to diffuse ST elevations -Troponins negative -Seen by cardiology who felt EKG changes were likely related to stroke, no reciprocal ST depressions to suggest an actual ST elevation MI -Ejection fraction of 30 to 35% on echocardiogram, unchanged from prior -No plans on invasive angiography -Currently on beta-blockers, spironolactone, Imdur and lisinopril -Volume status appears to be  euvolemic -We will use Lasix as needed  Nonsustained VT -Asymptomatic -Continue beta-blockers  Hypertension -Blood pressure is controlled, if not on the low end -We will continue cardiac medications with holding parameters for low blood pressure  Hyperlipidemia -Continue statin  Osteomyelitis of left foot -Status post left foot incision and drainage with debridement, fourth metatarsal head resection and placement of graft by podiatry -Cultures from procedure growing Proteus mirabilis and Staphylococcus cohnii -He is on doxycycline and Ancef, stop dates of been included -We will follow-up with podiatry regarding any further recommendations/follow-up  Rheumatoid arthritis -He is chronically on prednisone 5 mg twice daily    DVT prophylaxis: enoxaparin (LOVENOX) injection 40 mg Start: 10/16/20 1130 SCD's Start: 10/14/20 1852  Code Status: Full code Family Communication: Updated patient's cousin Ms. Docia Furl who is his primary caretaker Disposition Plan: Status is: Inpatient  Remains inpatient appropriate because:Unsafe d/c plan and IV treatments appropriate due to intensity of illness or inability to take PO  Dispo: The patient is from: Home              Anticipated d/c is to: CIR              Patient currently is medically stable to d/c.   Difficult to place patient No         Consultants:  Podiatry Neurology Cardiology Critical care Interventional radiology  Procedures:  6/19 Left foot incision and drainage with debridement, 4th met head resection, placement of graft. 6/22 mechanical thrombectomy of the left MCA/MCA posterior division branch occlusion ETT 6/22> 6/23 for procedure  Antimicrobials:  Doxycycline Cefazolin   Subjective:   Objective: Vitals:   10/18/20 0426 10/18/20 0500 10/18/20  0836 10/18/20 1113  BP: (!) 98/50  134/70 (!) 111/53  Pulse: 72  94 76  Resp: 17   18  Temp: 97.7 F (36.5 C)  98.2 F (36.8 C) 98 F (36.7 C)   TempSrc: Oral  Oral   SpO2: 95%  98% 99%  Weight:  65.6 kg    Height:        Intake/Output Summary (Last 24 hours) at 10/18/2020 1546 Last data filed at 10/18/2020 0604 Gross per 24 hour  Intake 686.96 ml  Output 700 ml  Net -13.04 ml   Filed Weights   10/16/20 0415 10/17/20 0500 10/18/20 0500  Weight: 63.6 kg 64.6 kg 65.6 kg    Examination:  General exam: Appears calm and comfortable  Respiratory system: Clear to auscultation. Respiratory effort normal. Cardiovascular system: S1 & S2 heard, RRR. No JVD, murmurs, rubs, gallops or clicks. No pedal edema. Gastrointestinal system: Abdomen is nondistended, soft and nontender. No organomegaly or masses felt. Normal bowel sounds heard. Central nervous system: Alert and oriented. Appears to have global weakness Extremities: no edema in LE bilaterally Skin: left foot as below Psychiatry: Judgement and insight appear normal. Mood & affect appropriate.         Data Reviewed: I have personally reviewed following labs and imaging studies  CBC: Recent Labs  Lab 10/14/20 0321 10/14/20 2157 10/15/20 0500 10/16/20 0557 10/17/20 0105 10/18/20 0020  WBC 7.0  --  6.1 12.0* 7.5 6.1  HGB 8.5* 8.2* 9.1* 9.7* 10.0* 9.4*  HCT 27.5* 24.0* 29.4* 30.8* 30.4* 30.0*  MCV 78.8*  --  79.0* 78.2* 75.6* 78.3*  PLT 284  --  312 318 285 034   Basic Metabolic Panel: Recent Labs  Lab 10/12/20 0400 10/14/20 0321 10/14/20 2157 10/15/20 0500 10/15/20 2104 10/16/20 0557 10/17/20 0105 10/17/20 0913 10/18/20 0020  NA 137 137   < > 139 133* 137 137  --  136  K 3.6 4.3   < > 3.8 4.6 4.8 5.7* 3.4* 4.1  CL 106 108  --  109 103 105 108  --  107  CO2 25 22  --  20* 13* 19* 15*  --  22  GLUCOSE 93 107*  --  88 65* 63* 92  --  115*  BUN 8 10  --  7 5* 5* 9  --  6  CREATININE 0.61 0.53*  --  0.62 0.67 0.68 0.69  --  0.55*  CALCIUM 9.1 9.1  --  9.2 9.2 9.4 9.3  --  9.1  MG 1.6* 1.8  --  1.8  --  2.0  --   --  1.7  PHOS 1.9*  --   --   --   --    --   --   --   --    < > = values in this interval not displayed.   GFR: Estimated Creatinine Clearance: 80 mL/min (A) (by C-G formula based on SCr of 0.55 mg/dL (L)). Liver Function Tests: Recent Labs  Lab 10/12/20 0400 10/15/20 0500  AST  --  30  ALT  --  21  ALKPHOS  --  69  BILITOT  --  0.8  PROT  --  6.1*  ALBUMIN 2.5* 2.4*   No results for input(s): LIPASE, AMYLASE in the last 168 hours. No results for input(s): AMMONIA in the last 168 hours. Coagulation Profile: No results for input(s): INR, PROTIME in the last 168 hours. Cardiac Enzymes: No results for input(s): CKTOTAL, CKMB, CKMBINDEX,  TROPONINI in the last 168 hours. BNP (last 3 results) No results for input(s): PROBNP in the last 8760 hours. HbA1C: No results for input(s): HGBA1C in the last 72 hours. CBG: Recent Labs  Lab 10/14/20 1658 10/17/20 0745  GLUCAP 115* 91   Lipid Profile: No results for input(s): CHOL, HDL, LDLCALC, TRIG, CHOLHDL, LDLDIRECT in the last 72 hours. Thyroid Function Tests: No results for input(s): TSH, T4TOTAL, FREET4, T3FREE, THYROIDAB in the last 72 hours. Anemia Panel: No results for input(s): VITAMINB12, FOLATE, FERRITIN, TIBC, IRON, RETICCTPCT in the last 72 hours. Sepsis Labs: No results for input(s): PROCALCITON, LATICACIDVEN in the last 168 hours.  Recent Results (from the past 240 hour(s))  SARS CORONAVIRUS 2 (TAT 6-24 HRS) Nasopharyngeal Nasopharyngeal Swab     Status: None   Collection Time: 10/09/20  9:57 PM   Specimen: Nasopharyngeal Swab  Result Value Ref Range Status   SARS Coronavirus 2 NEGATIVE NEGATIVE Final    Comment: (NOTE) SARS-CoV-2 target nucleic acids are NOT DETECTED.  The SARS-CoV-2 RNA is generally detectable in upper and lower respiratory specimens during the acute phase of infection. Negative results do not preclude SARS-CoV-2 infection, do not rule out co-infections with other pathogens, and should not be used as the sole basis for treatment or  other patient management decisions. Negative results must be combined with clinical observations, patient history, and epidemiological information. The expected result is Negative.  Fact Sheet for Patients: SugarRoll.be  Fact Sheet for Healthcare Providers: https://www.woods-mathews.com/  This test is not yet approved or cleared by the [place] FDA and  has been authorized for detection and/or diagnosis of SARS-CoV-2 by FDA under an Emergency Use Authorization (EUA). This EUA will remain  in effect (meaning this test can be used) for the duration of the COVID-19 declaration under Se ction 564(b)(1) of the Act, 21 U.S.C. section 360bbb-3(b)(1), unless the authorization is terminated or revoked sooner.  Performed at [facility], [place] [address], [place], [place] [address]   Surgical pcr screen     Status: None   Collection Time: 10/10/20  3:34 PM   Specimen: Nasal Mucosa; Nasal Swab  Result Value Ref Range Status   MRSA, PCR NEGATIVE NEGATIVE Final   Staphylococcus aureus NEGATIVE NEGATIVE Final    Comment: (NOTE) The Xpert SA Assay (FDA approved for NASAL specimens in patients 3 years of age and older), is one component of a comprehensive surveillance program. It is not intended to diagnose infection nor to guide or monitor treatment. Performed at [facility], [place] [address], [place], [place] [address]   Aerobic/Anaerobic Culture w Gram Stain (surgical/deep wound)     Status: None   Collection Time: 10/11/20 11:06 AM   Specimen: Wound  Result Value Ref Range Status   Specimen Description   Final    WOUND LEFT FOOT Performed at [place] [address] [place], [place] [address]    Special Requests   Final    PATIENT ON FOLLOWING Cephas Darby Performed at [facility], [place] [address], [place], [place] [address]    Gram Stain NO WBC SEEN RARE GRAM POSITIVE  COCCI   Final   Culture   Final    RARE PROTEUS MIRABILIS RARE STAPHYLOCOCCUS COHNII NO ANAEROBES ISOLATED Performed at [facility], [place] [address] [place], [place] [address]    Report Status 10/17/2020 FINAL  Final   Organism ID, Bacteria PROTEUS MIRABILIS  Final   Organism ID, Bacteria  STAPHYLOCOCCUS COHNII  Final      Susceptibility   Proteus mirabilis - MIC*    AMPICILLIN <=2 SENSITIVE Sensitive     CEFAZOLIN <=4 SENSITIVE Sensitive     CEFEPIME <=0.12 SENSITIVE Sensitive     CEFTAZIDIME <=1 SENSITIVE Sensitive     CEFTRIAXONE <=0.25 SENSITIVE Sensitive     CIPROFLOXACIN <=0.25 SENSITIVE Sensitive     GENTAMICIN <=1 SENSITIVE Sensitive     IMIPENEM 2 SENSITIVE Sensitive     TRIMETH/SULFA <=20 SENSITIVE Sensitive     AMPICILLIN/SULBACTAM <=2 SENSITIVE Sensitive     PIP/TAZO <=4 SENSITIVE Sensitive     * RARE PROTEUS MIRABILIS   Staphylococcus cohnii - MIC*    CIPROFLOXACIN <=0.5 SENSITIVE Sensitive     ERYTHROMYCIN 0.5 SENSITIVE Sensitive     GENTAMICIN <=0.5 SENSITIVE Sensitive     OXACILLIN 0.5 RESISTANT Resistant     TETRACYCLINE <=1 SENSITIVE Sensitive     VANCOMYCIN 1 SENSITIVE Sensitive     TRIMETH/SULFA <=10 SENSITIVE Sensitive     CLINDAMYCIN <=0.25 SENSITIVE Sensitive     RIFAMPIN <=0.5 SENSITIVE Sensitive     Inducible Clindamycin NEGATIVE Sensitive     * RARE STAPHYLOCOCCUS COHNII  MRSA Next Gen by PCR, Nasal     Status: None   Collection Time: 10/14/20  8:55 PM   Specimen: Nasal Mucosa; Nasal Swab  Result Value Ref Range Status   MRSA by PCR Next Gen NOT DETECTED NOT DETECTED Final    Comment: (NOTE) The GeneXpert MRSA Assay (FDA approved for NASAL specimens only), is one component of a comprehensive MRSA colonization surveillance program. It is not intended to diagnose MRSA infection nor to guide or monitor treatment for MRSA infections. Test performance is not FDA approved in patients less than 67 years old. Performed at [place], [place] [address], [place], [place] [address]          Radiology Studies: No results found.      Scheduled Meds:  aspirin  81 mg Oral Daily   atorvastatin  80 mg Oral Daily   clopidogrel  75 mg Oral Daily   enoxaparin (LOVENOX) injection  40 mg Subcutaneous Q24H   gabapentin  300 mg Oral BID   [START ON 10/19/2020] isosorbide mononitrate  60 mg Oral QPM   [START ON 10/19/2020] lisinopril  5 mg Oral Daily   mouth rinse  15 mL Mouth Rinse BID   [START ON 10/19/2020] metoprolol succinate  25 mg Oral QPM   montelukast  10 mg Oral QHS   pantoprazole  40 mg Oral Daily   predniSONE  5 mg Oral BID WC   spironolactone  25 mg Oral Daily   Continuous Infusions:   ceFAZolin (ANCEF) IV 2 g (10/18/20 1456)   doxycycline (VIBRAMYCIN) IV 100 mg (10/18/20 0820)     LOS: 9 days    Time spent: 69mns    JKathie Dike MD [facility]   If 7PM-7AM, please contact night-coverage [email or web address]  10/18/2020, 3:46 PM

## 2020-10-18 NOTE — Progress Notes (Signed)
Cardiology Progress Note  Patient ID: Jonathan [name] MRN: [id number] DOB: 1960-05-07 Date of Encounter: 10/18/2020  Primary Cardiologist: Minus Breeding, MD  Subjective   Chief Complaint: None.  HPI: Communication is limited due to expressive aphasia.  He reports he has no pain in his chest when I interrogate him.  He is able to say no and shake his head.  He appears comfortable.  He is euvolemic.  Telemetry with brief PVCs overnight.  ROS:  All other ROS reviewed and negative. Pertinent positives noted in the HPI.     Inpatient Medications  Scheduled Meds:  aspirin  81 mg Oral Daily   atorvastatin  80 mg Oral Daily   clopidogrel  75 mg Oral Daily   enoxaparin (LOVENOX) injection  40 mg Subcutaneous Q24H   gabapentin  300 mg Oral BID   isosorbide mononitrate  60 mg Oral Daily   lisinopril  10 mg Oral Daily   mouth rinse  15 mL Mouth Rinse BID   metoprolol succinate  25 mg Oral Daily   montelukast  10 mg Oral QHS   pantoprazole  40 mg Oral Daily   predniSONE  5 mg Oral BID WC   Continuous Infusions:   ceFAZolin (ANCEF) IV Stopped (10/18/20 0544)   doxycycline (VIBRAMYCIN) IV 100 mg (10/18/20 0820)   PRN Meds: acetaminophen **OR** acetaminophen (TYLENOL) oral liquid 160 mg/5 mL **OR** acetaminophen, fluticasone, ondansetron **OR** ondansetron (ZOFRAN) IV, senna-docusate   Vital Signs   Vitals:   10/17/20 2351 10/18/20 0426 10/18/20 0500 10/18/20 0836  BP: (!) 94/52 (!) 98/50  134/70  Pulse: 82 72  94  Resp: 17 17    Temp: 98.7 F (37.1 C) 97.7 F (36.5 C)  98.2 F (36.8 C)  TempSrc: Oral Oral  Oral  SpO2: 99% 95%  98%  Weight:   65.6 kg   Height:        Intake/Output Summary (Last 24 hours) at 10/18/2020 0938 Last data filed at 10/18/2020 0604 Gross per 24 hour  Intake 866.96 ml  Output 700 ml  Net 166.96 ml   Last 3 Weights 10/18/2020 10/17/2020 10/16/2020  Weight (lbs) 144 lb 10 oz 142 lb 6.7 oz 140 lb 3.4 oz  Weight (kg) 65.6 kg 64.6 kg 63.6 kg       Telemetry  Overnight telemetry shows sinus rhythm, brief PVCs, which I personally reviewed.   ECG  The most recent ECG shows sinus rhythm heart rate 79, diffuse ST elevations noted in the anterior lateral leads, inferior leads are improved since yesterday, which I personally reviewed.   Physical Exam   Vitals:   10/17/20 2351 10/18/20 0426 10/18/20 0500 10/18/20 0836  BP: (!) 94/52 (!) 98/50  134/70  Pulse: 82 72  94  Resp: 17 17    Temp: 98.7 F (37.1 C) 97.7 F (36.5 C)  98.2 F (36.8 C)  TempSrc: Oral Oral  Oral  SpO2: 99% 95%  98%  Weight:   65.6 kg   Height:        Intake/Output Summary (Last 24 hours) at 10/18/2020 0938 Last data filed at 10/18/2020 0604 Gross per 24 hour  Intake 866.96 ml  Output 700 ml  Net 166.96 ml    Last 3 Weights 10/18/2020 10/17/2020 10/16/2020  Weight (lbs) 144 lb 10 oz 142 lb 6.7 oz 140 lb 3.4 oz  Weight (kg) 65.6 kg 64.6 kg 63.6 kg    Body mass index is 25.62 kg/m.  General: Well nourished, well developed, in no  acute distress Head: Atraumatic, normal size  Eyes: PEERLA, EOMI  Neck: Supple, no JVD Endocrine: No thryomegaly Cardiac: Normal S1, S2; RRR; no murmurs, rubs, or gallops Lungs: Clear to auscultation bilaterally, no wheezing, rhonchi or rales  Abd: Soft, nontender, no hepatomegaly  Ext: No edema, left foot in bandage Skin: Warm and dry, no rashes   Neuro: Alert, awake, expressive aphasia noted  Labs  High Sensitivity Troponin:   Recent Labs  Lab 10/15/20 0036 10/15/20 0252 10/15/20 0830 10/16/20 1052 10/16/20 1225  TROPONINIHS 25* 25* 25* 26* 30*     Cardiac EnzymesNo results for input(s): TROPONINI in the last 168 hours. No results for input(s): TROPIPOC in the last 168 hours.  Chemistry Recent Labs  Lab 10/12/20 0400 10/14/20 0321 10/15/20 0500 10/15/20 2104 10/16/20 0557 10/17/20 0105 10/17/20 0913 10/18/20 0020  NA 137   < > 139   < > 137 137  --  136  K 3.6   < > 3.8   < > 4.8 5.7* 3.4* 4.1  CL 106    < > 109   < > 105 108  --  107  CO2 25   < > 20*   < > 19* 15*  --  22  GLUCOSE 93   < > 88   < > 63* 92  --  115*  BUN 8   < > 7   < > 5* 9  --  6  CREATININE 0.61   < > 0.62   < > 0.68 0.69  --  0.55*  CALCIUM 9.1   < > 9.2   < > 9.4 9.3  --  9.1  PROT  --   --  6.1*  --   --   --   --   --   ALBUMIN 2.5*  --  2.4*  --   --   --   --   --   AST  --   --  30  --   --   --   --   --   ALT  --   --  21  --   --   --   --   --   ALKPHOS  --   --  69  --   --   --   --   --   BILITOT  --   --  0.8  --   --   --   --   --   GFRNONAA >60   < > >60   < > >60 >60  --  >60  ANIONGAP 6   < > 10   < > 13 14  --  7   < > = values in this interval not displayed.    Hematology Recent Labs  Lab 10/16/20 0557 10/17/20 0105 10/18/20 0020  WBC 12.0* 7.5 6.1  RBC 3.94* 4.02* 3.83*  HGB 9.7* 10.0* 9.4*  HCT 30.8* 30.4* 30.0*  MCV 78.2* 75.6* 78.3*  MCH 24.6* 24.9* 24.5*  MCHC 31.5 32.9 31.3  RDW 16.3* 16.5* 16.7*  PLT 318 285 310   BNPNo results for input(s): BNP, PROBNP in the last 168 hours.  DDimer No results for input(s): DDIMER in the last 168 hours.   Radiology  No results found.  Cardiac Studies  TTE 10/15/2020  1. Akinesis of the inferior and inferolateral walls; remaining walls  hypokinetic; overall moderate to severe LV dysfunction.   2. Left ventricular ejection fraction, by estimation, is  30 to 35%. The  left ventricle has moderate to severely decreased function. The left  ventricle demonstrates regional wall motion abnormalities (see scoring  diagram/findings for description). The  left ventricular internal cavity size was mildly dilated. Left ventricular  diastolic parameters are consistent with Grade II diastolic dysfunction  (pseudonormalization). Elevated left atrial pressure.   3. Right ventricular systolic function is normal. The right ventricular  size is normal.   4. The mitral valve is grossly normal. Trivial mitral valve  regurgitation.   5. The aortic valve is  tricuspid. Aortic valve regurgitation is moderate.  Mild aortic valve sclerosis is present, with no evidence of aortic valve  stenosis.   Patient Profile  Jonathan [name] is a 60 y.o. male with CAD (CTO of RCA), ischemic cardiomyopathy (EF 30-35%), hyperlipidemia, chronic venous insufficiency who was admitted on 10/11/2020 for osteomyelitis of the left foot.  He underwent debridement.  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  He is status post thrombectomy.  Cardiology was consulted due to EKG showing diffuse ST elevations.  Assessment & Plan  Abnormal EKG, diffuse ST elevation in the anterior lateral and inferior leads -Initially called as a code STEMI.  EKG changes likely related to stroke.  No reciprocal ST depressions to suggest an actual ST elevation myocardial infarction. -Troponins negative. -Echo is unchanged from prior.  EF 30-35%.  Wall motion shows inferior akinesis which is unchanged from prior.  He has a known CTO of the RCA.  His EKG does not match his wall motion abnormalities as the anterior wall is hypokinetic. -Overall I agree this is stroke related. -No plans for invasive angiography.  2.  Ischemic cardiomyopathy, EF 30-35%, CAD with CTO of the RCA -Euvolemic on exam.  Known history of ischemic cardiomyopathy with akinesis of the inferior wall.  Echo was unchanged. -He appears euvolemic to me. -He is on aspirin and Plavix due to recent stroke. -Home Imdur has been restarted. -Continue high intensity statin -Lisinopril has been restarted.  Restart home Aldactone as well. -Would recommend Lasix 20 mg daily as needed.  3.  Nonsustained ventricular tachycardia -Episodes are brief now.  Really nonexistent.  Just PVCs mainly noted. -Continue home beta-blocker  [facility] will sign off.   Medication Recommendations: Heart failure medications as above. Other recommendations (labs, testing, etc): None. Follow up as an outpatient: Please notify cardiology once he is  closer to discharge.  We will arrange hospital follow-up.  For questions or updates, please contact [place] Please consult [email or web address] for contact info under   Time Spent with Patient: I have spent a total of 25 minutes with patient reviewing hospital notes, telemetry, EKGs, labs and examining the patient as well as establishing an assessment and plan that was discussed with the patient.  > 50% of time was spent in direct patient care.    Signed, Addison Naegeli. Audie Box, MD, [place]  10/18/2020 9:38 AM

## 2020-10-18 NOTE — Progress Notes (Signed)
STROKE TEAM PROGRESS NOTE   INTERVAL HISTORY His RN is at the bedside. Pt lying in bed, awake alert, not in distress. Still has eyeglasses occluder on. Yesterday started CHF meds, and BP was low. However, this am last BP was 130s. Cardiology on board and recommend to restart spironolactone.   Vitals:   10/18/20 0426 10/18/20 0500 10/18/20 0836 10/18/20 1113  BP: (!) 98/50  134/70 (!) 111/53  Pulse: 72  94 76  Resp: 17   18  Temp: 97.7 F (36.5 C)  98.2 F (36.8 C) 98 F (36.7 C)  TempSrc: Oral  Oral   SpO2: 95%  98% 99%  Weight:  65.6 kg    Height:       CBC:  Recent Labs  Lab 10/17/20 0105 10/18/20 0020  WBC 7.5 6.1  HGB 10.0* 9.4*  HCT 30.4* 30.0*  MCV 75.6* 78.3*  PLT 285 948   Basic Metabolic Panel:  Recent Labs  Lab 10/12/20 0400 10/14/20 0321 10/16/20 0557 10/17/20 0105 10/17/20 0913 10/18/20 0020  NA 137   < > 137 137  --  136  K 3.6   < > 4.8 5.7* 3.4* 4.1  CL 106   < > 105 108  --  107  CO2 25   < > 19* 15*  --  22  GLUCOSE 93   < > 63* 92  --  115*  BUN 8   < > 5* 9  --  6  CREATININE 0.61   < > 0.68 0.69  --  0.55*  CALCIUM 9.1   < > 9.4 9.3  --  9.1  MG 1.6*   < > 2.0  --   --  1.7  PHOS 1.9*  --   --   --   --   --    < > = values in this interval not displayed.   Lipid Panel:  Recent Labs  Lab 10/15/20 0500  CHOL 160  TRIG 103  105  HDL 38*  CHOLHDL 4.2  VLDL 21  LDLCALC 101*   HgbA1c:  Recent Labs  Lab 10/15/20 0500  HGBA1C 5.5   Urine Drug Screen:  Recent Labs  Lab 10/15/20 0408  LABOPIA POSITIVE*  COCAINSCRNUR NONE DETECTED  LABBENZ NONE DETECTED  AMPHETMU NONE DETECTED  THCU NONE DETECTED  LABBARB NONE DETECTED    Alcohol Level  Recent Labs  Lab 10/14/20 1853  ETH <10    IMAGING past 24 hours No results found.  PHYSICAL EXAM General: Appears well-developed; no distress Psych: Affect appropriate to situation Eyes: No scleral injection HENT: No OP obstrucion Head: Normocephalic.  Cardiovascular: Normal  rate and regular rhythm. Reviewed EKG changes. Respiratory: Effort normal and breath sounds normal to anterior ascultation; Vent GI: Soft.  No distension. There is no tenderness.  Skin: WDI    Neurological Examination Neuro - awake alert, eyes open, orientated to age, place, but not to time. No aphasia, but paucity of speech and intermittent perseveration, psychomotor slowing but able to name and repeat, following all simple commands. No significant gaze palsy, but on eyeglass with occluder, b/l gaze with end-gaze nystagmus, tracking bilaterally, visual field full, pupils equal size 32mm but sluggish to light. Left mild ptosis, and left mild facial droop. Tongue midline. Bilateral UEs 3/5, no drift. Bilaterally LEs 3/5, no drift. Sensation symmetrical bilaterally subjectively, b/l FTN intact grossly but slow, gait not tested.    ASSESSMENT/PLAN [name] is a 60 y.o. male initially admitted with  left foot ulcer and osteo on vanc and zosyn, noted to have acute R sided weakness and aphasia and transferred to [facility] for thrombectomy for potential LMCA M2 occlusion vs high grade stenosis. EKG enroute with ST elevation concerning for STEMI; this was d/w cardiology on call, they reviewed his recent cath and thought the the EKG findings likely from known RCA rather than LAD. Heparin gtt was started.   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 Small vessel disease. Atrophy. ASPECTS 10.    CTA head & neck LM1 occlusion CT perfusion 44ml penumbra with no core IR s/p thrombectomy with TICI 3 flow F/u [facility] 6/23 without bleed  MRI  LMCA and BG with small petechial hemorrhage  2D Echo 30-35% EF, akinesis of inferior/inferolateral walls, severe LV dysfunction.  Recommend 30 day cardiac event monitoring as outpt to rule out  afib LDL 101 HgbA1c 5.5 VTE prophylaxis - lovenox aspirin 81 mg daily prior to admission, now on DAPT  for 3 weeks, then plavix only Therapy recommendations: CIR  disposition:  pending  EKG changes Cardiomyopathy  Cardiology following.  ST-T changes more likely due to stroke, not STEMI After d/w CCM and cards, heparin gtt stopped 10/15/2020  EF 30-35% Home med including lasix, spironolactone, metoprolol, imdur and lisinopril Resumed metoprolol 25, imdur 60 and lisinopril 5mg  Add spironolactone 25 per cardiology Lasix PRN per cardiology Close watch BP and avoid low BP  Unsustained VT Cardiology on board, recommend K > 4.0 and Mg > 2.0 K 3.4 -> 4.1 Mg 1.7, cardiology recommend > 2 -> supplement  Hypertension Home meds:  imdur, Aldactone, Toprol XL, lasix and lisinopril Stable on the low end Resumed metoprolol, imdur and lisinopril  Add spironolactone per cardiology Avoid low BP, recommend to keep SBP > 100 if possible  Hyperlipidemia Home meds:  none  LDL 101, goal < 70 On Lipitor 80mg  Continue statin at discharge  Other Stroke Risk Factors Former Cigarette smoker CAD dCHF  Other Active Problems Developmental delay, Autism spectrum disorder Left foot Wound with Osteomyelitis- on vanco and zyson - duration of abx per primary team  Hospital day # 9  Neurology will sign off. Please call with questions. Pt will follow up with stroke clinic NP at [facility] in about 4 weeks. Thanks for the consult.   Rosalin Hawking, MD PhD Stroke Neurology 10/18/2020 11:27 AM     To contact Stroke Continuity provider, please refer to [email or web address] After hours, contact General Neurology

## 2020-10-18 NOTE — Progress Notes (Signed)
EKG CRITICAL VALUE     12 lead EKG performed.  Critical value noted.  Loistine Simas, RN notified.   Warren Lacy, CCT 10/18/2020 8:03 AM

## 2020-10-18 NOTE — PMR Pre-admission (Signed)
PMR Admission Coordinator Pre-Admission Assessment  Patient: Jonathan [name] is an 60 y.o., male MRN: [id number] DOB: 1961/02/22 Height: 5' 3"  (1.6 m) Weight: 65.6 kg  Insurance Information HMO:     PPO:      PCP:      IPA:      80/20: yes     OTHER:  PRIMARY: Medicare Part A and B       Policy#: [id number]      Subscriber: Pt.Subscriber: Pt.  hone#: Verified online,  eligible for part A and B 01/01/2425  Fax#: Pre-Cert#:       Employer: Benefits:  Phone #:      Name: Eff. Date: Parts A ad B effective 03/25/2016  Deduct: $1556      Out of Pocket Max:  None      Life Max: N/A  CIR: 100%      SNF: 100 days Outpatient: 80%     Co-Pay: 20% Home Health: 100%      Co-Pay: none DME: 80%     Co-Pay: 20% Providers: patient's choice   SECONDARY  Medicaid of      Policy#: [id number]    Financial Counselor:       Phone#:   The "Data Collection Information Summary" for patients in Inpatient Rehabilitation Facilities with attached "Privacy Act [place] Records" was provided and verbally reviewed with: Family  Emergency Contact Information Contact Information     Name Relation Home Work Mobile   [name],[name] Other   [phone]   brack,margaret Other   [phone]       Current Medical History  Patient Admitting Diagnosis: CVA History of Present Illness:  Jonathan [name] is a 60 year old right-handed male with history significant for autism spectrum disorder, RA maintained on Arava and prednisone, right knee and hip arthroplasty as well as left total hip arthroplasty 8563, systolic congestive heart failure, chronic venous stasis with chronic left foot ulcer with recent onchomycosis with debridement.  Patient with recent admission 09/26/2020 to 09/29/2020 for exacerbation of CHF.  Per chart review patient lives with cousin for the last 34 years and did family support.  1 level home 5 steps to entry.  Ambulates short distances with a cane needing assist for ADLs.  Presented  10/09/2020 with ischemic changes of left foot maintained on vancomycin and Zosyn.  Patient underwent left foot incision and drainage with debridement fourth met head resection placement of graft 10/11/2020 per podiatry Dr. Cannon Kettle.  ABIs completed postoperatively showing normal triphasic waveforms in both lower extremities with some elevated pressures suggesting calcification of the vessels.  Vascular surgery Dr. Donnetta Hutching consulted showing no evidence of arterial disease no need for further follow-up at that time.  Patient did complete a 7-day course of IV Ancef for wound care as well as Vibramycin.  On 10/14/2020 patient with acute onset of right side weakness and aphasia.  Cranial CT scan showed abnormal edema and loss of gray-white differentiation in the left basal ganglia concerning for acute or subacute infarct.  No acute hemorrhage.  CT angiogram of the head showed occlusion versus severe stenosis of proximal left M2 MCA with irregular distal opacification.  Patient underwent diagnostic cerebral angiogram with mechanical thrombectomy 10/14/2020 per interventional radiology.  MRI of the brain showed acute infarct left MCA territory affecting lenticulostriate's and inferior division cortex/white matter.  Mild petechial hemorrhage at the level of the left basal ganglia.  Echocardiogram with ejection fraction of 30 to 35%.  The left ventricle showed moderate to severely decreased function.  The left ventricle demonstrated regional wall motion abnormalities with grade 2 diastolic dysfunction.  Recommendations of 30-day cardiac event monitor as outpatient.  Patient was cleared to begin aspirin and Plavix for CVA prophylaxis x3 weeks then Plavix alone.  Subcutaneous Lovenox for DVT prophylaxis.  Cardiology services continues to follow for CHF and Aldactone has been resumed.  Troponins mildly elevated 25-26-30 felt to be related to demand ischemia.  Patient is tolerating a regular consistency diet.  Therapy evaluations  completed due to patient's right-sided weakness and aphasia was admitted for a comprehensive rehab program Complete NIHSS TOTAL: 14  Patient's medical record from [facility] has been reviewed by the rehabilitation admission coordinator and physician.  Past Medical History  Past Medical History:  Diagnosis Date   Anemia    H/o using iron in the past    Arthritis    rheumatoid   Chronic back pain    Chronic HFrEF (heart failure with reduced ejection fraction) ([place]) 08/2019   Admitted 6 4-7/20/2022 with CHF   Coronary artery disease, occlusive 08/01/2019   RCA CTO with left-to-right collaterals.  Widely patent LCA-large LAD was several small diagonal branches and small diffusely diseased LCx with multiple OM branches.   Hyperlipidemia    Ischemic cardiomyopathy 09/02/2019   Initial EF was 40 to 45%, as of June 2022 EF now 30 to 35%.  Known RCA occlusion.  There is inferior and inferoseptal akinesis on echo.   Joint pain    Joint swelling    Rheumatoid arthritis(714.0)     Family History   family history includes Cancer in his brother and mother; Heart failure in his father; Hypertension in his mother.  Prior Rehab/Hospitalizations Has the patient had prior rehab or hospitalizations prior to admission? No  Has the patient had major surgery during 100 days prior to admission? Yes   Current Medications  Current Facility-Administered Medications:    acetaminophen (TYLENOL) tablet 650 mg, 650 mg, Oral, Q4H PRN, 650 mg at 10/17/20 1124 **OR** acetaminophen (TYLENOL) 160 MG/5ML solution 650 mg, 650 mg, Per Tube, Q4H PRN **OR** acetaminophen (TYLENOL) suppository 650 mg, 650 mg, Rectal, Q4H PRN, Rikki Spearing, NP   aspirin chewable tablet 81 mg, 81 mg, Oral, Daily, Julian Hy, DO, 81 mg at 10/18/20 1126   atorvastatin (LIPITOR) tablet 80 mg, 80 mg, Oral, Daily, Rosalin Hawking, MD, 80 mg at 10/18/20 1125   clopidogrel (PLAVIX) tablet 75 mg, 75 mg, Oral, Daily, Noemi Chapel P, DO, 75 mg at 10/18/20 1125   enoxaparin (LOVENOX) injection 40 mg, 40 mg, Subcutaneous, Q24H, Simpson, Paula B, NP, 40 mg at 10/18/20 1126   fluticasone (FLONASE) 50 MCG/ACT nasal spray 1 spray, 1 spray, Each Nare, Daily PRN, Cyndia Skeeters, Taye T, MD   gabapentin (NEURONTIN) capsule 300 mg, 300 mg, Oral, BID, Julian Hy, DO, 300 mg at 10/18/20 2121   isosorbide mononitrate (IMDUR) 24 hr tablet 60 mg, 60 mg, Oral, QPM, Memon, Jehanzeb, MD   lisinopril (ZESTRIL) tablet 5 mg, 5 mg, Oral, Daily, Memon, Jehanzeb, MD   MEDLINE mouth rinse, 15 mL, Mouth Rinse, BID, Garvin Fila, MD, 15 mL at 10/18/20 2121   metoprolol succinate (TOPROL-XL) 24 hr tablet 25 mg, 25 mg, Oral, QPM, Memon, Jehanzeb, MD   montelukast (SINGULAIR) tablet 10 mg, 10 mg, Oral, QHS, Clark, Laura P, DO, 10 mg at 10/18/20 2121   ondansetron (ZOFRAN) tablet 4 mg, 4 mg, Oral, Q6H PRN **OR** ondansetron (ZOFRAN) injection 4 mg, 4 mg, Intravenous,  Q6H PRN, Elwyn Reach, MD   pantoprazole (PROTONIX) EC tablet 40 mg, 40 mg, Oral, Daily, Henri Medal, RPH, 40 mg at 10/18/20 1126   predniSONE (DELTASONE) tablet 5 mg, 5 mg, Oral, BID WC, Clark, Laura P, DO, 5 mg at 10/18/20 1651   senna-docusate (Senokot-S) tablet 1 tablet, 1 tablet, Oral, QHS PRN, Rikki Spearing, NP   spironolactone (ALDACTONE) tablet 25 mg, 25 mg, Oral, Daily, Rosalin Hawking, MD, 25 mg at 10/18/20 1651  Patients Current Diet:  Diet Order             Diet - low sodium heart healthy           Diet Carb Modified Fluid consistency: Thin; Room service appropriate? Yes with Assist  Diet effective now                   Precautions / Restrictions Precautions Precautions: Fall Precaution Comments: SBP< 140; post op shoe (not in room after transfer from [facility]) Other Brace: post op shoe Restrictions Weight Bearing Restrictions: No LLE Weight Bearing: Weight bearing as tolerated Other Position/Activity Restrictions: in post-op shoe (shoe not in room - ortho  tech contacted for a replacement)   Has the patient had 2 or more falls or a fall with injury in the past year? No  Prior Activity Level Limited Community (1-2x/wk): pt. went out for appointments  Prior Functional Level Self Care: Did the patient need help bathing, dressing, using the toilet or eating? Needed some help  Indoor Mobility: Did the patient need assistance with walking from room to room (with or without device)? Independent  Stairs: Did the patient need assistance with internal or external stairs (with or without device)? Independent  Functional Cognition: Did the patient need help planning regular tasks such as shopping or remembering to take medications? Needed some help  [place] Devices/Equipment: Eyeglasses, Radio producer (specify quad or straight), Walker (specify type), Hospital bed, Other (Comment), Crutches (crutches) Home Equipment: Toilet riser, Shower seat, Bedside commode, Hospital bed, Wheelchair - power  Prior Device Use: Indicate devices/aids used by the patient prior to current illness, exacerbation or injury? None of the above  Current Functional Level Cognition  Arousal/Alertness: Awake/alert Overall Cognitive Status: Difficult to assess Difficult to assess due to: Impaired communication Orientation Level: Oriented to person Following Commands: Follows one step commands inconsistently, Follows one step commands with increased time Safety/Judgement: Decreased awareness of safety, Decreased awareness of deficits General Comments: difficult to assess due to expressive aphasia, pt stating yes to many questions during session, but able to answer a few open-ended questions such as "where is it hurting?" with pt responding "head" the pt follwed commands for LE movement well this session, slightly increased processing.  He was also able to tell me he is at [facility], and it is June. Attention: Sustained Sustained Attention:  Impaired Awareness: Impaired Awareness Impairment: Emergent impairment (unaware of errors) Problem Solving: Impaired Problem Solving Impairment: Functional basic Safety/Judgment: Impaired    Extremity Assessment (includes Sensation/Coordination)  Upper Extremity Assessment: RUE deficits/detail, LUE deficits/detail RUE Deficits / Details: Pt with limited ROM bil. 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  Hand to mouth limited to ~60* AAROM.  Rt hand with significant arthritic deformity. RUE Coordination: decreased fine motor, decreased gross motor LUE Deficits / Details: Pt with limited ROM bil. 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  Hand to mouth limited to ~70* AAROM.  Lt hand with arthritic deformity LUE Coordination: decreased gross motor, decreased fine motor  Lower Extremity Assessment: Defer to PT evaluation RLE Deficits / Details: pt able to perform parital ROM against gravity but demos good motion at ankle, knee, and hip. unable to clearly explain sensation, needed cues for positioning and motor planning LLE Deficits / Details: pt able to move through PROM, complete AROM of knee against gravity while sitting EOB. significant genu varus at rest.    ADLs  Overall ADL's : Needs assistance/impaired Eating/Feeding: Total assistance, Sitting, Bed level Eating/Feeding Details (indicate cue type and reason): REquires set up and assistance to feed. At home has specialized utensils. Grooming: Maximal assistance, Bed level, Sitting Upper Body Bathing: Total assistance, Sitting, Bed level Lower Body Bathing: Total assistance, Sitting/lateral leans Upper Body Dressing : Total assistance, Sitting, Bed level Lower Body Dressing: Total assistance, Bed level Toilet Transfer: Total assistance Toilet Transfer Details (indicate cue type and reason): unable to attempt due to  dizziness and headache Toileting- Clothing Manipulation and Hygiene: Total assistance, Bed level Toileting - Clothing Manipulation Details (indicate cue type and reason): pt incontinent of stool.  He was assisted with peri care bed level Functional mobility during ADLs: Total assistance    Mobility  Overal bed mobility: Needs Assistance Bed Mobility: Supine to Sit, Sit to Supine, Rolling Rolling: Min assist Supine to sit: Mod assist, HOB elevated Sit to supine: Mod assist, HOB elevated General bed mobility comments: minA to complete rolling, slight assist to initiate, pt with good effort and completing most of roll without use of bed rails. modA to complete movement of BLE to EOB, modA to elevate trunk to sitting    Transfers  Overall transfer level: Needs assistance Equipment used: 1 person hand held assist Transfers: Sit to/from Stand, Lateral/Scoot Transfers Sit to Stand: Total assist Stand pivot transfers: Min guard  Lateral/Scoot Transfers: Total assist General transfer comment: deferred due to pt request to return to supine, reporting headache in sitting    Ambulation / Gait / Stairs / Wheelchair Mobility  Ambulation/Gait Ambulation/Gait assistance: Herbalist (Feet): 40 Feet Assistive device: Crutches Gait Pattern/deviations: Step-through pattern, Decreased stride length, Decreased weight shift to right General Gait Details: deferred due to onset pt headache in sitting and inability to stand    Posture / Balance Dynamic Sitting Balance Sitting balance - Comments: no overt LOB, unable to reach outside BOS Balance Overall balance assessment: Needs assistance Sitting-balance support: Feet supported, Single extremity supported Sitting balance-Leahy Scale: Poor Sitting balance - Comments: no overt LOB, unable to reach outside BOS Standing balance support: Bilateral upper extremity supported Standing balance-Leahy Scale: Zero Standing balance comment: unable to  achieve stand    Special needs/care consideration Skin Surgical incision to R lateral foot, amputation of L toe. Cracking on R foot and Special service needs Pt. With communication deficits 2/2 autism spectrum disorder; On continuous magnesium sulfate IV (2g, 50 ml per hour)   Previous Home Environment (from acute therapy documentation) Living Arrangements: Other (Comment) Available Help at Discharge: Family, Available 24 hours/day Type of Home: [place]: One level Home Access: Stairs to enter Entrance Stairs-Rails: Right, Left Entrance Stairs-Number of Steps: 5 Bathroom Shower/Tub: Government social research officer Accessibility: Yes [place]: Yes Type of Home Care Services: [facility] (if known): agency name unknown Additional Comments: has a lift chair that heps him stand. has a hospital bed that he elevates  as well for standing. - all information from prior charting prior to onset of aphasia. Pt unable to provide info, but chart review indicates that he lives wtih his cousin and had aids 7 days/week  Discharge Living Setting Plans for Discharge Living Setting: Patient's home Type of Home at Discharge: House Discharge Home Layout: One level Discharge Home Access: Stairs to enter Entrance Stairs-Rails: Left, Right Discharge Bathroom Shower/Tub: Tub/shower unit Discharge Bathroom Toilet: Handicapped height Discharge Bathroom Accessibility: Yes How Accessible: Accessible via walker Does the patient have any problems obtaining your medications?: No  Social/Family/Support Systems Patient Roles: Other (Comment) Contact Information: [phone] Anticipated Caregiver: Marylen Ponto Anticipated Caregiver's Contact Information: [phone] Ability/Limitations of Caregiver: Can provide min A Caregiver Availability: 24/7 Discharge Plan Discussed with Primary Caregiver: Yes Is Caregiver In Agreement with Plan?: No Does Caregiver/Family  have Issues with Lodging/Transportation while Pt is in Rehab?: Yes  Goals Patient/Family Goal for Rehab: PT/OT/SLP min  A Expected length of stay: 14-16 days Pt/Family Agrees to Admission and willing to participate: Yes Program Orientation Provided & Reviewed with Pt/Caregiver Including Roles  & Responsibilities: Yes  Decrease burden of Care through IP rehab admission: Specialzed equipment needs, Decrease number of caregivers, Bowel and bladder program, and Patient/family education  Possible need for SNF placement upon discharge: not anticipated   Patient Condition: I have reviewed medical records from [facility], spoken with CM, and patient and family member. I met with patient at the bedside and discussed via phone for inpatient rehabilitation assessment.  Patient will benefit from ongoing PT, OT, and SLP, can actively participate in 3 hours of therapy a day 5 days of the week, and can make measurable gains during the admission.  Patient will also benefit from the coordinated team approach during an Inpatient Acute Rehabilitation admission.  The patient will receive intensive therapy as well as Rehabilitation physician, nursing, social worker, and care management interventions.  Due to bladder management, bowel management, safety, skin/wound care, disease management, medication administration, pain management, and patient education the patient requires 24 hour a day rehabilitation nursing.  The patient is currently mod A to total A  with mobility and basic ADLs.  Discharge setting and therapy post discharge at home with home health is anticipated.  Patient has agreed to participate in the Acute Inpatient Rehabilitation Program and will admit today.  Preadmission Screen Completed By:  Genella Mech, 10/19/2020 10:46 AM ______________________________________________________________________   Discussed status with Dr. Naaman Plummer on 10/18/2020 at 1000 and received approval for admission  today.  Admission Coordinator:  Danise Edge, time 6945 /Date 10/18/2020   Assessment/Plan: Diagnosis: left MCA CVA Does the need for close, 24 hr/day Medical supervision in concert with the patient's rehab needs make it unreasonable for this patient to be served in a less intensive setting? Yes Co-Morbidities requiring supervision/potential complications: autism, RA, left foot ulcer, sCHF, Due to bladder management, bowel management, safety, skin/wound care, disease management, medication administration, pain management, and patient education, does the patient require 24 hr/day rehab nursing? Yes Does the patient require coordinated care of a physician, rehab nurse, PT, OT, and SLP to address physical and functional deficits in the context of the above medical diagnosis(es)? Yes Addressing deficits in the following areas: balance, endurance, locomotion, strength, transferring, bowel/bladder control, bathing, dressing, feeding, grooming, toileting, cognition, speech, language, swallowing, and psychosocial support Can the patient actively participate in an intensive therapy program of at least 3 hrs of therapy 5 days a week? Yes The potential for  patient to make measurable gains while on inpatient rehab is excellent Anticipated functional outcomes upon discharge from inpatient rehab: min assist PT, min assist OT, min assist SLP Estimated rehab length of stay to reach the above functional goals is: 14-16 days Anticipated discharge destination: Home 10. Overall Rehab/Functional Prognosis: excellent   MD Signature: Meredith Staggers, MD, [place] [facility] 10/19/2020

## 2020-10-18 NOTE — Progress Notes (Signed)
EKG CRITICAL VALUE     12 lead EKG performed.  Critical value noted.  Clois Dupes, RN notified.   Warren Lacy, CCT 10/18/2020 8:02 AM

## 2020-10-19 ENCOUNTER — Inpatient Hospital Stay (HOSPITAL_COMMUNITY)
Admission: RE | Admit: 2020-10-19 | Discharge: 2020-10-30 | DRG: 057 | Disposition: A | Discharge status: Home Health | Payer: Medicare Other | Attending: Physical Medicine and Rehabilitation | Admitting: Physical Medicine and Rehabilitation

## 2020-10-19 ENCOUNTER — Other Ambulatory Visit: Payer: Self-pay

## 2020-10-19 ENCOUNTER — Encounter (HOSPITAL_COMMUNITY): Payer: Self-pay | Admitting: Physical Medicine and Rehabilitation

## 2020-10-19 DIAGNOSIS — F840 Autistic disorder: Secondary | ICD-10-CM | POA: Diagnosis present

## 2020-10-19 DIAGNOSIS — I5022 Chronic systolic (congestive) heart failure: Secondary | ICD-10-CM | POA: Diagnosis present

## 2020-10-19 DIAGNOSIS — I63512 Cerebral infarction due to unspecified occlusion or stenosis of left middle cerebral artery: Secondary | ICD-10-CM | POA: Diagnosis present

## 2020-10-19 DIAGNOSIS — I69319 Unspecified symptoms and signs involving cognitive functions following cerebral infarction: Secondary | ICD-10-CM

## 2020-10-19 DIAGNOSIS — Z96651 Presence of right artificial knee joint: Secondary | ICD-10-CM | POA: Diagnosis present

## 2020-10-19 DIAGNOSIS — M546 Pain in thoracic spine: Secondary | ICD-10-CM | POA: Diagnosis present

## 2020-10-19 DIAGNOSIS — Z809 Family history of malignant neoplasm, unspecified: Secondary | ICD-10-CM | POA: Diagnosis not present

## 2020-10-19 DIAGNOSIS — Z7982 Long term (current) use of aspirin: Secondary | ICD-10-CM

## 2020-10-19 DIAGNOSIS — Z96643 Presence of artificial hip joint, bilateral: Secondary | ICD-10-CM | POA: Diagnosis present

## 2020-10-19 DIAGNOSIS — Z888 Allergy status to other drugs, medicaments and biological substances status: Secondary | ICD-10-CM

## 2020-10-19 DIAGNOSIS — I110 Hypertensive heart disease with heart failure: Secondary | ICD-10-CM | POA: Diagnosis present

## 2020-10-19 DIAGNOSIS — I2510 Atherosclerotic heart disease of native coronary artery without angina pectoris: Secondary | ICD-10-CM | POA: Diagnosis present

## 2020-10-19 DIAGNOSIS — G8929 Other chronic pain: Secondary | ICD-10-CM | POA: Diagnosis present

## 2020-10-19 DIAGNOSIS — Z87891 Personal history of nicotine dependence: Secondary | ICD-10-CM | POA: Diagnosis not present

## 2020-10-19 DIAGNOSIS — R2689 Other abnormalities of gait and mobility: Secondary | ICD-10-CM | POA: Diagnosis present

## 2020-10-19 DIAGNOSIS — I959 Hypotension, unspecified: Secondary | ICD-10-CM | POA: Diagnosis not present

## 2020-10-19 DIAGNOSIS — E785 Hyperlipidemia, unspecified: Secondary | ICD-10-CM | POA: Diagnosis present

## 2020-10-19 DIAGNOSIS — I69398 Other sequelae of cerebral infarction: Secondary | ICD-10-CM | POA: Diagnosis not present

## 2020-10-19 DIAGNOSIS — Z7952 Long term (current) use of systemic steroids: Secondary | ICD-10-CM

## 2020-10-19 DIAGNOSIS — I69320 Aphasia following cerebral infarction: Secondary | ICD-10-CM

## 2020-10-19 DIAGNOSIS — Z8249 Family history of ischemic heart disease and other diseases of the circulatory system: Secondary | ICD-10-CM

## 2020-10-19 DIAGNOSIS — E611 Iron deficiency: Secondary | ICD-10-CM | POA: Diagnosis present

## 2020-10-19 DIAGNOSIS — E559 Vitamin D deficiency, unspecified: Secondary | ICD-10-CM | POA: Diagnosis present

## 2020-10-19 DIAGNOSIS — M069 Rheumatoid arthritis, unspecified: Secondary | ICD-10-CM | POA: Diagnosis present

## 2020-10-19 DIAGNOSIS — I10 Essential (primary) hypertension: Secondary | ICD-10-CM | POA: Diagnosis not present

## 2020-10-19 DIAGNOSIS — E8809 Other disorders of plasma-protein metabolism, not elsewhere classified: Secondary | ICD-10-CM | POA: Diagnosis not present

## 2020-10-19 DIAGNOSIS — I255 Ischemic cardiomyopathy: Secondary | ICD-10-CM | POA: Diagnosis present

## 2020-10-19 DIAGNOSIS — Z79899 Other long term (current) drug therapy: Secondary | ICD-10-CM

## 2020-10-19 DIAGNOSIS — H539 Unspecified visual disturbance: Secondary | ICD-10-CM | POA: Diagnosis present

## 2020-10-19 DIAGNOSIS — I69351 Hemiplegia and hemiparesis following cerebral infarction affecting right dominant side: Secondary | ICD-10-CM | POA: Diagnosis present

## 2020-10-19 DIAGNOSIS — H5500 Unspecified nystagmus: Secondary | ICD-10-CM | POA: Diagnosis present

## 2020-10-19 DIAGNOSIS — L97522 Non-pressure chronic ulcer of other part of left foot with fat layer exposed: Secondary | ICD-10-CM

## 2020-10-19 DIAGNOSIS — L97529 Non-pressure chronic ulcer of other part of left foot with unspecified severity: Secondary | ICD-10-CM | POA: Diagnosis present

## 2020-10-19 MED ORDER — ATORVASTATIN CALCIUM 80 MG PO TABS
80.0000 mg | ORAL_TABLET | Freq: Every day | ORAL | Status: DC
  Administered 2020-10-20 – 2020-10-30 (×11): 80 mg via ORAL
  Filled 2020-10-19 (×11): qty 1

## 2020-10-19 MED ORDER — ACETAMINOPHEN 160 MG/5ML PO SOLN: 650.0000 mg | ORAL | Status: DC | PRN

## 2020-10-19 MED ORDER — FUROSEMIDE 20 MG PO TABS: 20.0000 mg | ORAL_TABLET | Freq: Every day | ORAL | Status: DC | PRN

## 2020-10-19 MED ORDER — ATORVASTATIN CALCIUM 80 MG PO TABS: 80.0000 mg | ORAL_TABLET | Freq: Every day | ORAL | Status: DC

## 2020-10-19 MED ORDER — GABAPENTIN 300 MG PO CAPS
300.0000 mg | ORAL_CAPSULE | Freq: Two times a day (BID) | ORAL | Status: DC
  Administered 2020-10-19 – 2020-10-25 (×13): 300 mg via ORAL
  Filled 2020-10-19 (×13): qty 1

## 2020-10-19 MED ORDER — FLUTICASONE PROPIONATE 50 MCG/ACT NA SUSP: 1.0000 | Freq: Every day | NASAL | Status: DC | PRN

## 2020-10-19 MED ORDER — CLOPIDOGREL BISULFATE 75 MG PO TABS: 75.0000 mg | ORAL_TABLET | Freq: Every day | ORAL | Status: DC

## 2020-10-19 MED ORDER — ONDANSETRON HCL 4 MG/2ML IJ SOLN: 4.0000 mg | Freq: Four times a day (QID) | INTRAMUSCULAR | Status: DC | PRN

## 2020-10-19 MED ORDER — ASPIRIN 81 MG PO CHEW
81.0000 mg | CHEWABLE_TABLET | Freq: Every day | ORAL | Status: DC
  Administered 2020-10-20 – 2020-10-30 (×11): 81 mg via ORAL
  Filled 2020-10-19 (×11): qty 1

## 2020-10-19 MED ORDER — ACETAMINOPHEN 325 MG PO TABS
650.0000 mg | ORAL_TABLET | ORAL | Status: DC | PRN
  Administered 2020-10-20 – 2020-10-29 (×9): 650 mg via ORAL
  Filled 2020-10-19 (×9): qty 2

## 2020-10-19 MED ORDER — LISINOPRIL 5 MG PO TABS
5.0000 mg | ORAL_TABLET | Freq: Every day | ORAL | Status: DC
  Administered 2020-10-20: 5 mg via ORAL
  Filled 2020-10-19: qty 1

## 2020-10-19 MED ORDER — ENOXAPARIN SODIUM 40 MG/0.4ML IJ SOSY
40.0000 mg | PREFILLED_SYRINGE | INTRAMUSCULAR | Status: DC
Start: 2020-10-19 — End: 2020-10-19

## 2020-10-19 MED ORDER — ISOSORBIDE MONONITRATE ER 30 MG PO TB24
60.0000 mg | ORAL_TABLET | Freq: Every evening | ORAL | Status: DC
  Administered 2020-10-19 – 2020-10-29 (×11): 60 mg via ORAL
  Filled 2020-10-19 (×11): qty 2

## 2020-10-19 MED ORDER — ENOXAPARIN SODIUM 40 MG/0.4ML IJ SOSY
40.0000 mg | PREFILLED_SYRINGE | INTRAMUSCULAR | Status: DC
  Administered 2020-10-20 – 2020-10-29 (×10): 40 mg via SUBCUTANEOUS
  Filled 2020-10-19 (×10): qty 0.4

## 2020-10-19 MED ORDER — PREDNISONE 10 MG PO TABS
5.0000 mg | ORAL_TABLET | Freq: Two times a day (BID) | ORAL | Status: DC
  Administered 2020-10-19 – 2020-10-30 (×22): 5 mg via ORAL
  Filled 2020-10-19 (×22): qty 1

## 2020-10-19 MED ORDER — MONTELUKAST SODIUM 10 MG PO TABS
10.0000 mg | ORAL_TABLET | Freq: Every day | ORAL | Status: DC
  Administered 2020-10-19 – 2020-10-29 (×11): 10 mg via ORAL
  Filled 2020-10-19 (×11): qty 1

## 2020-10-19 MED ORDER — METOPROLOL SUCCINATE ER 25 MG PO TB24
25.0000 mg | ORAL_TABLET | Freq: Every evening | ORAL | Status: DC
  Administered 2020-10-19 – 2020-10-27 (×7): 25 mg via ORAL
  Filled 2020-10-19 (×9): qty 1

## 2020-10-19 MED ORDER — ONDANSETRON HCL 4 MG PO TABS
4.0000 mg | ORAL_TABLET | Freq: Four times a day (QID) | ORAL | Status: DC | PRN
  Filled 2020-10-19: qty 1

## 2020-10-19 MED ORDER — ACETAMINOPHEN 650 MG RE SUPP: 650.0000 mg | RECTAL | Status: DC | PRN

## 2020-10-19 MED ORDER — SPIRONOLACTONE 25 MG PO TABS
25.0000 mg | ORAL_TABLET | Freq: Every day | ORAL | Status: DC
  Administered 2020-10-20 – 2020-10-25 (×6): 25 mg via ORAL
  Filled 2020-10-19 (×7): qty 1

## 2020-10-19 MED ORDER — PANTOPRAZOLE SODIUM 40 MG PO TBEC
40.0000 mg | DELAYED_RELEASE_TABLET | Freq: Every day | ORAL | Status: DC
  Administered 2020-10-20 – 2020-10-30 (×11): 40 mg via ORAL
  Filled 2020-10-19 (×11): qty 1

## 2020-10-19 MED ORDER — METOPROLOL SUCCINATE ER 25 MG PO TB24: 25.0000 mg | ORAL_TABLET | Freq: Every evening | ORAL | 5 refills | Status: DC

## 2020-10-19 MED ORDER — LISINOPRIL 5 MG PO TABS: 5.0000 mg | ORAL_TABLET | Freq: Every day | ORAL | 11 refills | Status: DC

## 2020-10-19 MED ORDER — CLOPIDOGREL BISULFATE 75 MG PO TABS
75.0000 mg | ORAL_TABLET | Freq: Every day | ORAL | Status: DC
  Administered 2020-10-20 – 2020-10-30 (×11): 75 mg via ORAL
  Filled 2020-10-19 (×11): qty 1

## 2020-10-19 MED ORDER — ISOSORBIDE MONONITRATE ER 60 MG PO TB24: 60.0000 mg | ORAL_TABLET | Freq: Every evening | ORAL | 0 refills | Status: DC

## 2020-10-19 MED ORDER — PANTOPRAZOLE SODIUM 40 MG PO TBEC: 40.0000 mg | DELAYED_RELEASE_TABLET | Freq: Every day | ORAL | Status: DC

## 2020-10-19 MED ORDER — SENNOSIDES-DOCUSATE SODIUM 8.6-50 MG PO TABS
1.0000 | ORAL_TABLET | Freq: Every evening | ORAL | Status: DC | PRN
  Administered 2020-10-24 – 2020-10-27 (×2): 1 via ORAL
  Filled 2020-10-19 (×2): qty 1

## 2020-10-19 NOTE — Progress Notes (Signed)
PMR Admission Coordinator Pre-Admission Assessment   Patient: Clarence Cogswell is an 60 y.o., male MRN: [id number] DOB: 06/21/1960 Height: _0  (1.6 m) Weight: 65.6 kg   Insurance Information HMO:     PPO:      PCP:      IPA:      80/20: yes     OTHER: PRIMARY: Medicare Part A and B       Policy#: [id number]      Subscriber: Pt.Subscriber: Pt.  hone#: Verified online,  eligible for part A and B 11/27/4625  Fax#: Pre-Cert#:       Employer: Benefits:  Phone #:      Name: Eff. Date: Parts A ad B effective 03/25/2016  Deduct: $1556      Out of Pocket Max:  None      Life Max: N/A  CIR: 100%      SNF: 100 days Outpatient: 80%     Co-Pay: 20% Home Health: 100%      Co-Pay: none DME: 80%     Co-Pay: 20% Providers: patient's choice   SECONDARY  Medicaid of [place]     Policy#: [id number]   Financial Counselor:       Phone#:   The "Data Collection Information Summary" for patients in Inpatient Rehabilitation Facilities with attached "Privacy Act [place] Records" was provided and verbally reviewed with: Family   Emergency Contact Information Contact Information       Name Relation Home Work Mobile    [name],[name] Other     [phone]    brack,margaret Other     [phone]           Current Medical History  Patient Admitting Diagnosis: CVA History of Present Illness:  Lawernce Earll is a 60 year old right-handed male with history significant for autism spectrum disorder, RA maintained on Arava and prednisone, right knee and hip arthroplasty as well as left total hip arthroplasty 1025, systolic congestive heart failure, chronic venous stasis with chronic left foot ulcer with recent onchomycosis with debridement.  Patient with recent admission 09/26/2020 to 09/29/2020 for exacerbation of CHF.  Per chart review patient lives with cousin for the last 19 years and did family support.  1 level home 5 steps to entry.  Ambulates short distances with a cane needing assist for ADLs.   Presented 10/09/2020 with ischemic changes of left foot maintained on vancomycin and Zosyn.  Patient underwent left foot incision and drainage with debridement fourth met head resection placement of graft 10/11/2020 per podiatry Dr. Cannon Kettle.  ABIs completed postoperatively showing normal triphasic waveforms in both lower extremities with some elevated pressures suggesting calcification of the vessels.  Vascular surgery Dr. Donnetta Hutching consulted showing no evidence of arterial disease no need for further follow-up at that time.  Patient did complete a 7-day course of IV Ancef for wound care as well as Vibramycin.  On 10/14/2020 patient with acute onset of right side weakness and aphasia.  Cranial CT scan showed abnormal edema and loss of gray-white differentiation in the left basal ganglia concerning for acute or subacute infarct.  No acute hemorrhage.  CT angiogram of the head showed occlusion versus severe stenosis of proximal left M2 MCA with irregular distal opacification.  Patient underwent diagnostic cerebral angiogram with mechanical thrombectomy 10/14/2020 per interventional radiology.  MRI of the brain showed acute infarct left MCA territory affecting lenticulostriate's and inferior division cortex/white matter.  Mild petechial hemorrhage at the level of the left basal ganglia.  Echocardiogram with ejection fraction of 30  to 35%.  The left ventricle showed moderate to severely decreased function.  The left ventricle demonstrated regional wall motion abnormalities with grade 2 diastolic dysfunction.  Recommendations of 30-day cardiac event monitor as outpatient.  Patient was cleared to begin aspirin and Plavix for CVA prophylaxis x3 weeks then Plavix alone.  Subcutaneous Lovenox for DVT prophylaxis.  Cardiology services continues to follow for CHF and Aldactone has been resumed.  Troponins mildly elevated 25-26-30 felt to be related to demand ischemia.  Patient is tolerating a regular consistency diet.  Therapy  evaluations completed due to patient's right-sided weakness and aphasia was admitted for a comprehensive rehab program Complete NIHSS TOTAL: 14   Patient's medical record from [facility] has been reviewed by the rehabilitation admission coordinator and physician.   Past Medical History      Past Medical History:  Diagnosis Date   Anemia      H/o using iron in the past   Arthritis      rheumatoid   Chronic back pain     Chronic HFrEF (heart failure with reduced ejection fraction) ([place]) 08/2019    Admitted 6 4-7/20/2022 with CHF   Coronary artery disease, occlusive 08/01/2019    RCA CTO with left-to-right collaterals.  Widely patent LCA-large LAD was several small diagonal branches and small diffusely diseased LCx with multiple OM branches.   Hyperlipidemia     Ischemic cardiomyopathy 09/02/2019    Initial EF was 40 to 45%, as of June 2022 EF now 30 to 35%.  Known RCA occlusion.  There is inferior and inferoseptal akinesis on echo.   Joint pain     Joint swelling     Rheumatoid arthritis(714.0)        Family History   family history includes Cancer in his brother and mother; Heart failure in his father; Hypertension in his mother.   Prior Rehab/Hospitalizations Has the patient had prior rehab or hospitalizations prior to admission? No   Has the patient had major surgery during 100 days prior to admission? Yes              Current Medications   Current Facility-Administered Medications:   acetaminophen (TYLENOL) tablet 650 mg, 650 mg, Oral, Q4H PRN, 650 mg at 10/17/20 1124 **OR** acetaminophen (TYLENOL) 160 MG/5ML solution 650 mg, 650 mg, Per Tube, Q4H PRN **OR** acetaminophen (TYLENOL) suppository 650 mg, 650 mg, Rectal, Q4H PRN, Rikki Spearing, NP   aspirin chewable tablet 81 mg, 81 mg, Oral, Daily, Julian Hy, DO, 81 mg at 10/18/20 1126   atorvastatin (LIPITOR) tablet 80 mg, 80 mg, Oral, Daily, Rosalin Hawking, MD, 80 mg at 10/18/20 1125   clopidogrel  (PLAVIX) tablet 75 mg, 75 mg, Oral, Daily, Noemi Chapel P, DO, 75 mg at 10/18/20 1125   enoxaparin (LOVENOX) injection 40 mg, 40 mg, Subcutaneous, Q24H, Simpson, Paula B, NP, 40 mg at 10/18/20 1126   fluticasone (FLONASE) 50 MCG/ACT nasal spray 1 spray, 1 spray, Each Nare, Daily PRN, Cyndia Skeeters, Taye T, MD   gabapentin (NEURONTIN) capsule 300 mg, 300 mg, Oral, BID, Julian Hy, DO, 300 mg at 10/18/20 2121   isosorbide mononitrate (IMDUR) 24 hr tablet 60 mg, 60 mg, Oral, QPM, Memon, Jehanzeb, MD   lisinopril (ZESTRIL) tablet 5 mg, 5 mg, Oral, Daily, Memon, Jehanzeb, MD   MEDLINE mouth rinse, 15 mL, Mouth Rinse, BID, Leonie Man, Pramod S, MD, 15 mL at 10/18/20 2121   metoprolol succinate (TOPROL-XL) 24 hr tablet 25 mg, 25 mg, Oral,  QPM, Kathie Dike, MD   montelukast (SINGULAIR) tablet 10 mg, 10 mg, Oral, QHS, Julian Hy, DO, 10 mg at 10/18/20 2121   ondansetron (ZOFRAN) tablet 4 mg, 4 mg, Oral, Q6H PRN **OR** ondansetron (ZOFRAN) injection 4 mg, 4 mg, Intravenous, Q6H PRN, Jonelle Sidle, Mohammad L, MD   pantoprazole (PROTONIX) EC tablet 40 mg, 40 mg, Oral, Daily, Henri Medal, RPH, 40 mg at 10/18/20 1126   predniSONE (DELTASONE) tablet 5 mg, 5 mg, Oral, BID WC, Clark, Mitsuo Budnick P, DO, 5 mg at 10/18/20 1651   senna-docusate (Senokot-S) tablet 1 tablet, 1 tablet, Oral, QHS PRN, Rikki Spearing, NP   spironolactone (ALDACTONE) tablet 25 mg, 25 mg, Oral, Daily, Rosalin Hawking, MD, 25 mg at 10/18/20 1651   Patients Current Diet:  Diet Order                  Diet - low sodium heart healthy             Diet Carb Modified Fluid consistency: Thin; Room service appropriate? Yes with Assist  Diet effective now                         Precautions / Restrictions Precautions Precautions: Fall Precaution Comments: SBP< 140; post op shoe (not in room after transfer from [facility]) Other Brace: post op shoe Restrictions Weight Bearing Restrictions: No LLE Weight Bearing: Weight bearing as tolerated Other  Position/Activity Restrictions: in post-op shoe (shoe not in room - ortho tech contacted for a replacement)    Has the patient had 2 or more falls or a fall with injury in the past year? No   Prior Activity Level Limited Community (1-2x/wk): pt. went out for appointments   Prior Functional Level Self Care: Did the patient need help bathing, dressing, using the toilet or eating? Needed some help   Indoor Mobility: Did the patient need assistance with walking from room to room (with or without device)? Independent   Stairs: Did the patient need assistance with internal or external stairs (with or without device)? Independent   Functional Cognition: Did the patient need help planning regular tasks such as shopping or remembering to take medications? Needed some help   [place] / [place] Devices/Equipment: Eyeglasses, Radio producer (specify quad or straight), Walker (specify type), Hospital bed, Other (Comment), Crutches (crutches) Home Equipment: Toilet riser, Shower seat, Bedside commode, Hospital bed, Wheelchair - power   Prior Device Use: Indicate devices/aids used by the patient prior to current illness, exacerbation or injury? None of the above   Current Functional Level Cognition   Arousal/Alertness: Awake/alert Overall Cognitive Status: Difficult to assess Difficult to assess due to: Impaired communication Orientation Level: Oriented to person Following Commands: Follows one step commands inconsistently, Follows one step commands with increased time Safety/Judgement: Decreased awareness of safety, Decreased awareness of deficits General Comments: difficult to assess due to expressive aphasia, pt stating yes to many questions during session, but able to answer a few open-ended questions such as "where is it hurting?" with pt responding "head" the pt follwed commands for LE movement well this session, slightly increased processing.  He was also able to tell me he  is at [facility], and it is June. Attention: Sustained Sustained Attention: Impaired Awareness: Impaired Awareness Impairment: Emergent impairment (unaware of errors) Problem Solving: Impaired Problem Solving Impairment: Functional basic Safety/Judgment: Impaired    Extremity Assessment (includes Sensation/Coordination)   Upper Extremity Assessment: RUE deficits/detail, LUE deficits/detail RUE  Deficits / Details: Pt with limited ROM bil. 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  Hand to mouth limited to ~60* AAROM.  Rt hand with significant arthritic deformity. RUE Coordination: decreased fine motor, decreased gross motor LUE Deficits / Details: Pt with limited ROM bil. 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  Hand to mouth limited to ~70* AAROM.  Lt hand with arthritic deformity LUE Coordination: decreased gross motor, decreased fine motor  Lower Extremity Assessment: Defer to PT evaluation RLE Deficits / Details: pt able to perform parital ROM against gravity but demos good motion at ankle, knee, and hip. unable to clearly explain sensation, needed cues for positioning and motor planning LLE Deficits / Details: pt able to move through PROM, complete AROM of knee against gravity while sitting EOB. significant genu varus at rest.     ADLs   Overall ADL's : Needs assistance/impaired Eating/Feeding: Total assistance, Sitting, Bed level Eating/Feeding Details (indicate cue type and reason): REquires set up and assistance to feed. At home has specialized utensils. Grooming: Maximal assistance, Bed level, Sitting Upper Body Bathing: Total assistance, Sitting, Bed level Lower Body Bathing: Total assistance, Sitting/lateral leans Upper Body Dressing : Total assistance, Sitting, Bed level Lower Body Dressing: Total assistance, Bed level Toilet Transfer: Total assistance Toilet  Transfer Details (indicate cue type and reason): unable to attempt due to dizziness and headache Toileting- Clothing Manipulation and Hygiene: Total assistance, Bed level Toileting - Clothing Manipulation Details (indicate cue type and reason): pt incontinent of stool.  He was assisted with peri care bed level Functional mobility during ADLs: Total assistance     Mobility   Overal bed mobility: Needs Assistance Bed Mobility: Supine to Sit, Sit to Supine, Rolling Rolling: Min assist Supine to sit: Mod assist, HOB elevated Sit to supine: Mod assist, HOB elevated General bed mobility comments: minA to complete rolling, slight assist to initiate, pt with good effort and completing most of roll without use of bed rails. modA to complete movement of BLE to EOB, modA to elevate trunk to sitting     Transfers   Overall transfer level: Needs assistance Equipment used: 1 person hand held assist Transfers: Sit to/from Stand, Lateral/Scoot Transfers Sit to Stand: Total assist Stand pivot transfers: Min guard  Lateral/Scoot Transfers: Total assist General transfer comment: deferred due to pt request to return to supine, reporting headache in sitting     Ambulation / Gait / Stairs / Wheelchair Mobility   Ambulation/Gait Ambulation/Gait assistance: Herbalist (Feet): 40 Feet Assistive device: Crutches Gait Pattern/deviations: Step-through pattern, Decreased stride length, Decreased weight shift to right General Gait Details: deferred due to onset pt headache in sitting and inability to stand     Posture / Balance Dynamic Sitting Balance Sitting balance - Comments: no overt LOB, unable to reach outside BOS Balance Overall balance assessment: Needs assistance Sitting-balance support: Feet supported, Single extremity supported Sitting balance-Leahy Scale: Poor Sitting balance - Comments: no overt LOB, unable to reach outside BOS Standing balance support: Bilateral upper extremity  supported Standing balance-Leahy Scale: Zero Standing balance comment: unable to achieve stand     Special needs/care consideration Skin Surgical incision to R lateral foot, amputation of L toe. Cracking on R foot and Special service needs Pt. With communication deficits 2/2 autism spectrum disorder; On continuous magnesium sulfate IV (2g, 50 ml per hour)    Previous Home Environment (from acute  therapy documentation) Living Arrangements: Other (Comment) Available Help at Discharge: Family, Available 24 hours/day Type of Home: House Home Layout: One level Home Access: Stairs to enter Entrance Stairs-Rails: Right, Left Entrance Stairs-Number of Steps: 5 Bathroom Shower/Tub: Government social research officer Accessibility: Yes [place]: Yes Type of Home Care Services: [place] (if known): agency name unknown Additional Comments: has a lift chair that heps him stand. has a hospital bed that he elevates as well for standing. - all information from prior charting prior to onset of aphasia. Pt unable to provide info, but chart review indicates that he lives wtih his cousin and had aids 7 days/week   Discharge Living Setting Plans for Discharge Living Setting: Patient's home Type of Home at Discharge: House Discharge Home Layout: One level Discharge Home Access: Stairs to enter Entrance Stairs-Rails: Left, Right Discharge Bathroom Shower/Tub: Tub/shower unit Discharge Bathroom Toilet: Handicapped height Discharge Bathroom Accessibility: Yes How Accessible: Accessible via walker Does the patient have any problems obtaining your medications?: No   Social/Family/Support Systems Patient Roles: Other (Comment) Contact Information: [phone] Anticipated Caregiver: Marylen Ponto Anticipated Caregiver's Contact Information: [phone] Ability/Limitations of Caregiver: Can provide min A Caregiver Availability: 24/7 Discharge Plan Discussed with  Primary Caregiver: Yes Is Caregiver In Agreement with Plan?: No Does Caregiver/Family have Issues with Lodging/Transportation while Pt is in Rehab?: Yes   Goals Patient/Family Goal for Rehab: PT/OT/SLP min  A Expected length of stay: 14-16 days Pt/Family Agrees to Admission and willing to participate: Yes Program Orientation Provided & Reviewed with Pt/Caregiver Including Roles  & Responsibilities: Yes   Decrease burden of Care through IP rehab admission: Specialzed equipment needs, Decrease number of caregivers, Bowel and bladder program, and Patient/family education   Possible need for SNF placement upon discharge: not anticipated    Patient Condition: I have reviewed medical records from [facility], spoken with CM, and patient and family member. I met with patient at the bedside and discussed via phone for inpatient rehabilitation assessment.  Patient will benefit from ongoing PT, OT, and SLP, can actively participate in 3 hours of therapy a day 5 days of the week, and can make measurable gains during the admission.  Patient will also benefit from the coordinated team approach during an Inpatient Acute Rehabilitation admission.  The patient will receive intensive therapy as well as Rehabilitation physician, nursing, social worker, and care management interventions.  Due to bladder management, bowel management, safety, skin/wound care, disease management, medication administration, pain management, and patient education the patient requires 24 hour a day rehabilitation nursing.  The patient is currently mod A to total A  with mobility and basic ADLs.  Discharge setting and therapy post discharge at home with home health is anticipated.  Patient has agreed to participate in the Acute Inpatient Rehabilitation Program and will admit today.   Preadmission Screen Completed By:  Genella Mech, 10/19/2020 10:46 AM ______________________________________________________________________    Discussed status with Dr. Naaman Plummer on 10/18/2020 at 1000 and received approval for admission today.   Admission Coordinator:  Danise Edge, time 3295        /Date 10/18/2020    Assessment/Plan: Diagnosis: left MCA CVA Does the need for close, 24 hr/day Medical supervision in concert with the patient's rehab needs make it unreasonable for this patient to be served in a less intensive setting? Yes Co-Morbidities requiring supervision/potential complications: autism, RA, left foot ulcer, sCHF, Due to bladder management, bowel management, safety, skin/wound care,  disease management, medication administration, pain management, and patient education, does the patient require 24 hr/day rehab nursing? Yes Does the patient require coordinated care of a physician, rehab nurse, PT, OT, and SLP to address physical and functional deficits in the context of the above medical diagnosis(es)? Yes Addressing deficits in the following areas: balance, endurance, locomotion, strength, transferring, bowel/bladder control, bathing, dressing, feeding, grooming, toileting, cognition, speech, language, swallowing, and psychosocial support Can the patient actively participate in an intensive therapy program of at least 3 hrs of therapy 5 days a week? Yes The potential for patient to make measurable gains while on inpatient rehab is excellent Anticipated functional outcomes upon discharge from inpatient rehab: min assist PT, min assist OT, min assist SLP Estimated rehab length of stay to reach the above functional goals is: 14-16 days Anticipated discharge destination: Home 10. Overall Rehab/Functional Prognosis: excellent     MD Signature: Meredith Staggers, MD, [facility] 10/19/2020           Revision History

## 2020-10-19 NOTE — Progress Notes (Signed)
   PODIATRY CONSULTATION  NAME [name] Staggs MRN [id number] DOB [birth year] DOA 10/19/2020   S/p fourth metatarsal head resection with placement of graft LEFT. DOS: 10/11/2020.  Past Medical History:  Diagnosis Date   Anemia    H/o using iron in the past    Arthritis    rheumatoid   Chronic back pain    Chronic HFrEF (heart failure with reduced ejection fraction) ([place]) 08/2019   Admitted 6 4-7/20/2022 with CHF   Coronary artery disease, occlusive 08/01/2019   RCA CTO with left-to-right collaterals.  Widely patent LCA-large LAD was several small diagonal branches and small diffusely diseased LCx with multiple OM branches.   Hyperlipidemia    Ischemic cardiomyopathy 09/02/2019   Initial EF was 40 to 45%, as of June 2022 EF now 30 to 35%.  Known RCA occlusion.  There is inferior and inferoseptal akinesis on echo.   Joint pain    Joint swelling    Rheumatoid arthritis(714.0)     CBC Latest Ref Rng & Units 10/18/2020 10/17/2020 10/16/2020  WBC 4.0 - 10.5 K/uL 6.1 7.5 12.0(H)  Hemoglobin 13.0 - 17.0 g/dL 9.4(L) 10.0(L) 9.7(L)  Hematocrit 39.0 - 52.0 % 30.0(L) 30.4(L) 30.8(L)  Platelets 150 - 400 K/uL 310 285 318    BMP Latest Ref Rng & Units 10/18/2020 10/17/2020 10/17/2020  Glucose 70 - 99 mg/dL 115(H) - 92  BUN 6 - 20 mg/dL 6 - 9  Creatinine 0.61 - 1.24 mg/dL 0.55(L) - 0.69  Sodium 135 - 145 mmol/L 136 - 137  Potassium 3.5 - 5.1 mmol/L 4.1 3.4(L) 5.7(H)  Chloride 98 - 111 mmol/L 107 - 108  CO2 22 - 32 mmol/L 22 - 15(L)  Calcium 8.9 - 10.3 mg/dL 9.1 - 9.3      Physical Exam: Dressings removed today.  The graft is in place and adhered with staples.  No heavy drainage noted.  No malodor noted.    ASSESSMENT/PLAN OF CARE S/p fourth metatarsal head resection with placement of graft LT foot.  DOS: 10/11/2020  -Dressing changes every other day. Reapply surgical lubricant to the graft site followed by nonadherent Adaptic and dry sterile dressing.  2.  Continue IV antibiotics while  inpatient.  Patient likely okay to DC on oral antibiotics.  Based on cultures taken intraoperatively recommend ciprofloxacin 500 mg BID x 10 days 3.  Weightbearing as tolerated 4.  From a podiatry/surgical standpoint patient okay to be discharged.  Recommend follow-up in the office within 1 week of discharge with Dr. Cannon Kettle.  5.  If he remains inpatient through next week we will round on him again    Thank you for allowing me to assist in this patient's care.  Please contact me directly with any questions or concerns.  Cell [phone]   Edrick Kins, DPM [facility]  Dr. Edrick Kins, DPM    [address], [place] [address]                Office [phone]  Fax [phone]

## 2020-10-19 NOTE — Progress Notes (Signed)
Patient ID: Jonathan [name], male   DOB: 12/31/1960, 59 y.o.   MRN: [id number] Met with the patient to introduce self and the role of the nurse CM. Handouts on HF, HTN, HLD, and dietary modifications for hypo-magnesium and low protein left at the bedside to review with family/caregivers. Patient alert to self and can answer open ended questions when given time to process and cues. Reviewed therapy schedule, rehab routine and plan of care. Continue to follow along to discharge to review educational needs and provide education on medications, skin care and dietary modifications. Collaborate with the SW to facilitate preparation for discharge. .me 

## 2020-10-19 NOTE — Progress Notes (Signed)
Inpatient Rehabilitation Medication Review by a Pharmacist  A complete drug regimen review was completed for this patient to identify any potential clinically significant medication issues.  Clinically significant medication issues were identified:  no  Check AMION for pharmacist assigned to patient if future medication questions/issues arise during this admission.  Pharmacist comments:   Time spent performing this drug regimen review (minutes):  10 minutes   Lolita Rieger 10/19/2020 9:23 PM

## 2020-10-19 NOTE — H&P (Signed)
Physical Medicine and Rehabilitation Admission H&P    Chief Complaint  Patient presents with   Wound Infection  : HPI: Jonathan [name] is a 60 year old right-handed male with history significant for autism spectrum disorder, RA maintained on Arava and prednisone, right knee and hip arthroplasty as well as left total hip arthroplasty 1638, systolic congestive heart failure, chronic venous stasis with chronic left foot ulcer with recent onchomycosis with debridement.  Patient with recent admission 09/26/2020 to 09/29/2020 for exacerbation of CHF.  Per chart review patient lives with cousin for the last 12 years and did family support.  1 level home 5 steps to entry.  Ambulates short distances with a cane needing assist for ADLs.  Presented 10/09/2020 with ischemic changes of left foot maintained on vancomycin and Zosyn.  Patient underwent left foot incision and drainage with debridement fourth met head resection placement of graft 10/11/2020 per podiatry Dr. Cannon Kettle.  ABIs completed postoperatively showing normal triphasic waveforms in both lower extremities with some elevated pressures suggesting calcification of the vessels.  Vascular surgery Dr. Donnetta Hutching consulted showing no evidence of arterial disease no need for further follow-up at that time.  Patient did complete a 7-day course of IV Ancef for wound care as well as Vibramycin.  On 10/14/2020 patient with acute onset of right side weakness and aphasia.  Cranial CT scan showed abnormal edema and loss of gray-white differentiation in the left basal ganglia concerning for acute or subacute infarct.  No acute hemorrhage.  CT angiogram of the head showed occlusion versus severe stenosis of proximal left M2 MCA with irregular distal opacification.  Patient underwent diagnostic cerebral angiogram with mechanical thrombectomy 10/14/2020 per interventional radiology.  MRI of the brain showed acute infarct left MCA territory affecting lenticulostriate's and inferior  division cortex/white matter.  Mild petechial hemorrhage at the level of the left basal ganglia.  Echocardiogram with ejection fraction of 30 to 35%.  The left ventricle showed moderate to severely decreased function.  The left ventricle demonstrated regional wall motion abnormalities with grade 2 diastolic dysfunction.  Recommendations of 30-day cardiac event monitor as outpatient.  Patient was cleared to begin aspirin and Plavix for CVA prophylaxis x3 weeks then Plavix alone.  Subcutaneous Lovenox for DVT prophylaxis.  Cardiology services continues to follow for CHF and Aldactone has been resumed.  Troponins mildly elevated 25-26-30 felt to be related to demand ischemia.  Patient is tolerating a regular consistency diet.  Therapy evaluations completed due to patient's right-sided weakness and aphasia was admitted for a comprehensive rehab program.  Review of Systems  Constitutional:  Negative for chills and fever.  HENT:  Negative for hearing loss.   Eyes:  Negative for blurred vision and double vision.  Respiratory:  Positive for shortness of breath. Negative for cough.   Cardiovascular:  Positive for palpitations and leg swelling. Negative for chest pain.  Gastrointestinal:  Positive for constipation. Negative for heartburn, nausea and vomiting.  Genitourinary:  Negative for dysuria, flank pain and hematuria.  Musculoskeletal:  Positive for back pain, joint pain and myalgias.  Skin:  Negative for rash.  Neurological:  Positive for speech change and weakness.  All other systems reviewed and are negative. Past Medical History:  Diagnosis Date   Anemia    H/o using iron in the past    Arthritis    rheumatoid   Chronic back pain    Chronic HFrEF (heart failure with reduced ejection fraction) ([place]) 08/2019   Admitted 6 4-7/20/2022 with CHF  Coronary artery disease, occlusive 08/01/2019   RCA CTO with left-to-right collaterals.  Widely patent LCA-large LAD was several small diagonal branches  and small diffusely diseased LCx with multiple OM branches.   Hyperlipidemia    Ischemic cardiomyopathy 09/02/2019   Initial EF was 40 to 45%, as of June 2022 EF now 30 to 35%.  Known RCA occlusion.  There is inferior and inferoseptal akinesis on echo.   Joint pain    Joint swelling    Rheumatoid arthritis(714.0)    Past Surgical History:  Procedure Laterality Date   COLONOSCOPY N/A 04/03/2013   Procedure: COLONOSCOPY;  Surgeon: Irene Shipper, MD;  Location: [facility];  Service: Endoscopy;  Laterality: N/A;   COLONOSCOPY     ENDOVENOUS ABLATION SAPHENOUS VEIN W/ LASER Left 09/26/2019   endovenous laser ablation left greater saphenous vein by Gae Gallop MD    ENDOVENOUS ABLATION SAPHENOUS VEIN W/ LASER Right 10/31/2019   endovenous laser ablation right greater saphenous vein by Gae Gallop MD    ESOPHAGOGASTRODUODENOSCOPY N/A 04/03/2013   Procedure: ESOPHAGOGASTRODUODENOSCOPY (EGD);  Surgeon: Irene Shipper, MD;  Location: [facility];  Service: Endoscopy;  Laterality: N/A;  changed to moderate dr. Henrene Pastor did not want to go to the o.r. for an endo colon-jmt   EYE SURGERY Left    "when i was a very small boy"   GRAFT APPLICATION Left 79/39/0300   Procedure: GRAFT APPLICATION;  Surgeon: Landis Martins, DPM;  Location: [facility];  Service: Podiatry;  Laterality: Left;  [place] (rep already contacted: Matt Sides)   INCISION AND DRAINAGE OF WOUND Left 10/11/2020   Procedure: IRRIGATION AND DEBRIDEMENT WOUND;  Surgeon: Landis Martins, DPM;  Location: [facility];  Service: Podiatry;  Laterality: Left;   IR CT HEAD LTD  10/14/2020   IR PERCUTANEOUS ART THROMBECTOMY/INFUSION INTRACRANIAL INC DIAG ANGIO  10/14/2020   IR US GUIDE VASC ACCESS RIGHT  10/14/2020   JOINT REPLACEMENT     both knees   KNEE ARTHROPLASTY  06/07/2011   Procedure: COMPUTER ASSISTED TOTAL KNEE ARTHROPLASTY;  Surgeon: Mcarthur Rossetti, MD;  Location: [facility];  Service: Orthopedics;  Laterality: Right;  Right total knee  arthoplasty   KNEE CLOSED REDUCTION  07/19/2011   Procedure: CLOSED MANIPULATION KNEE;  Surgeon: Mcarthur Rossetti, MD;  Location: [facility];  Service: Orthopedics;  Laterality: Right;  Manipulation under anesthesia right knee   LEFT HEART CATH AND CORONARY ANGIOGRAPHY N/A 08/21/2019   Procedure: LEFT HEART CATH AND CORONARY ANGIOGRAPHY;  Surgeon: Belva Crome, MD;  Location: [facility];  Service: Cardiovascular; Mid to distal RCA CTO with faint left-to-right collaterals.  Widely patent left main.  Mid to distal LCx mild luminal irregularities.  Multiple OM branches.  Large draping LAD with several small diagonal branches.  EF ~40 to 45% with inferior AK.   METATARSAL HEAD EXCISION Left 10/11/2020   Procedure: METATARSAL HEAD EXCISION;  Surgeon: Landis Martins, DPM;  Location: [facility];  Service: Podiatry;  Laterality: Left;  4th metatarsal   RADIOLOGY WITH ANESTHESIA N/A 10/14/2020   Procedure: IR WITH ANESTHESIA;  Surgeon: Radiologist, Medication, MD;  Location: [facility];  Service: Radiology;  Laterality: N/A;   RIGHT/LEFT HEART CATH AND CORONARY ANGIOGRAPHY N/A 09/28/2020   Procedure: RIGHT/LEFT HEART CATH AND CORONARY ANGIOGRAPHY;  Surgeon: Nelva Bush, MD;  Location: [facility];  Service: Cardiovascular;; Single-vessel CAD with CTO of mid RCA.  Mild disease involving the LCA (LAD & LCx w/ multiple OM).  Stable since 2021.  Mild to mod elevated LVEDP.  Normal-mildly reduced CO/CI - 5.2, 3.3 ([place]), 4.6, 2.8 by (TD). RA 7 , RV 32/7, PA 29/20, PCWP 22.   TOTAL HIP ARTHROPLASTY Right 11/11/2014   Procedure: RIGHT TOTAL HIP ARTHROPLASTY ANTERIOR APPROACH;  Surgeon: Mcarthur Rossetti, MD;  Location: [facility];  Service: Orthopedics;  Laterality: Right;   TOTAL HIP ARTHROPLASTY Left 01/29/2015   Procedure: LEFT TOTAL HIP ARTHROPLASTY ANTERIOR APPROACH;  Surgeon: Mcarthur Rossetti, MD;  Location: [facility];  Service: Orthopedics;  Laterality: Left;   TRANSTHORACIC ECHOCARDIOGRAM   08/22/2019   EF 50 to 55%.  GR 1 DD. Mild Inferior Hypokinesis.   TRANSTHORACIC ECHOCARDIOGRAM  09/27/2019   EF 30-35%. Moderately reduced LVEF with Inferior/Inferoseptal Akinesis. Elevated LAP with mild LA dilation - Gr II DD. Mild-Mod AI, no AS.   Family History  Problem Relation Age of Onset   Cancer Mother        ?   Hypertension Mother    Heart failure Father        heart transplant   Cancer Brother    Anesthesia problems Neg Hx    Colon cancer Neg Hx        mother had "3" types of CA- unsure about which one   Esophageal cancer Neg Hx    Rectal cancer Neg Hx    Stomach cancer Neg Hx    Social History:  reports that he quit smoking about 16 years ago. His smoking use included cigarettes. He has never used smokeless tobacco. He reports that he does not drink alcohol and does not use drugs. Allergies:  Allergies  Allergen Reactions   Infliximab Nausea Only and Shortness Of Breath   Medications Prior to Admission  Medication Sig Dispense Refill   acetaminophen (TYLENOL) 325 MG tablet Take 2 tablets (650 mg total) by mouth every 4 (four) hours as needed for headache or mild pain. 30 tablet 0   aspirin EC 81 MG tablet Take 1 tablet (81 mg total) by mouth daily. 90 tablet 3   atorvastatin (LIPITOR) 40 MG tablet Take 40 mg by mouth daily.     celecoxib (CELEBREX) 200 MG capsule Take by mouth.     cetirizine (ZYRTEC) 10 MG tablet Take 10 mg by mouth daily as needed for allergies.     docusate sodium (COLACE) 100 MG capsule Take 1 capsule (100 mg total) by mouth 2 (two) times daily. (Patient taking differently: Take 100 mg by mouth daily as needed for mild constipation.) 30 capsule 0   fluticasone (FLONASE) 50 MCG/ACT nasal spray Place 1 spray into the nose daily as needed for allergies.     furosemide (LASIX) 40 MG tablet Take 1 tablet (40 mg total) by mouth daily. 30 tablet 0   gabapentin (NEURONTIN) 300 MG capsule Take 300 mg by mouth 2 (two) times daily.      isosorbide  mononitrate (IMDUR) 60 MG 24 hr tablet Take 1 tablet (60 mg total) by mouth daily. 30 tablet 0   leflunomide (ARAVA) 10 MG tablet Take 1 tablet by mouth daily.     lisinopril (ZESTRIL) 5 MG tablet Take 10 mg by mouth daily.     metoprolol succinate (TOPROL-XL) 25 MG 24 hr tablet Take 25 mg by mouth daily.     montelukast (SINGULAIR) 10 MG tablet Take 10 mg by mouth at bedtime.     omeprazole (PRILOSEC) 20 MG capsule Take 20 mg by mouth daily.      predniSONE (DELTASONE) 5 MG  tablet Take 5 mg by mouth 2 (two) times daily with a meal.     spironolactone (ALDACTONE) 25 MG tablet Take 25 mg by mouth daily.     triamcinolone (KENALOG) 0.1 % Apply 1 application topically daily as needed (to flaky skin).      Drug Regimen Review Drug regimen was reviewed and remains appropriate with no significant issues identified  Home: Home Living Family/patient expects to be discharged to:: Private residence Living Arrangements: Other (Comment) Available Help at Discharge: Family, Available 24 hours/day Type of Home: House Home Access: Stairs to enter [employer] of Steps: 5 Entrance Stairs-Rails: Right, Left Home Layout: One level Bathroom Shower/Tub: Chiropodist: Standard Bathroom Accessibility: Yes Home Equipment: Geneticist, molecular, Shower seat, Bedside commode, Hospital bed, Wheelchair - power Additional Comments: has a lift chair that heps him stand. has a hospital bed that he elevates as well for standing. - all information from prior charting prior to onset of aphasia. Pt unable to provide info, but chart review indicates that he lives wtih his cousin and had aids 7 days/week   Functional History: Prior Function Level of Independence: Needs assistance Gait / Transfers Assistance Needed: per chart review, pt was ambulating with crutches ADL's / Homemaking Assistance Needed: aide comes daily for assistance with bathing and dressing, getting out of bed,  longer utensiles  for feeding, Comments: ambulates with crutches  Functional Status:  Mobility: Bed Mobility Overal bed mobility: Needs Assistance Bed Mobility: Supine to Sit, Sit to Supine, Rolling Rolling: Min assist Supine to sit: Mod assist, HOB elevated Sit to supine: Mod assist, HOB elevated General bed mobility comments: minA to complete rolling, slight assist to initiate, pt with good effort and completing most of roll without use of bed rails. modA to complete movement of BLE to EOB, modA to elevate trunk to sitting Transfers Overall transfer level: Needs assistance Equipment used: 1 person hand held assist Transfers: Sit to/from Stand, Lateral/Scoot Transfers Sit to Stand: Total assist Stand pivot transfers: Min guard  Lateral/Scoot Transfers: Total assist General transfer comment: deferred due to pt request to return to supine, reporting headache in sitting Ambulation/Gait Ambulation/Gait assistance: Min assist Gait Distance (Feet): 40 Feet Assistive device: Crutches Gait Pattern/deviations: Step-through pattern, Decreased stride length, Decreased weight shift to right General Gait Details: deferred due to onset pt headache in sitting and inability to stand    ADL: ADL Overall ADL's : Needs assistance/impaired Eating/Feeding: Total assistance, Sitting, Bed level Eating/Feeding Details (indicate cue type and reason): REquires set up and assistance to feed. At home has specialized utensils. Grooming: Maximal assistance, Bed level, Sitting Upper Body Bathing: Total assistance, Sitting, Bed level Lower Body Bathing: Total assistance, Sitting/lateral leans Upper Body Dressing : Total assistance, Sitting, Bed level Lower Body Dressing: Total assistance, Bed level Toilet Transfer: Total assistance Toilet Transfer Details (indicate cue type and reason): unable to attempt due to dizziness and headache Toileting- Clothing Manipulation and Hygiene: Total assistance, Bed level Toileting -  Clothing Manipulation Details (indicate cue type and reason): pt incontinent of stool.  He was assisted with peri care bed level Functional mobility during ADLs: Total assistance  Cognition: Cognition Overall Cognitive Status: Difficult to assess Arousal/Alertness: Awake/alert Orientation Level: Oriented to person Attention: Sustained Sustained Attention: Impaired Awareness: Impaired Awareness Impairment: Emergent impairment (unaware of errors) Problem Solving: Impaired Problem Solving Impairment: Functional basic Safety/Judgment: Impaired Cognition Arousal/Alertness: Lethargic Behavior During Therapy: Flat affect Overall Cognitive Status: Difficult to assess Area of Impairment: Following commands, Safety/judgement, Awareness, Orientation,  Memory Orientation Level: Disoriented to, Time (pt stating yes to "is it nighttime") Memory: Decreased short-term memory Following Commands: Follows one step commands inconsistently, Follows one step commands with increased time Safety/Judgement: Decreased awareness of safety, Decreased awareness of deficits General Comments: difficult to assess due to expressive aphasia, pt stating yes to many questions during session, but able to answer a few open-ended questions such as "where is it hurting?" with pt responding "head" the pt follwed commands for LE movement well this session, slightly increased processing.  He was also able to tell me he is at [facility], and it is June. Difficult to assess due to: Impaired communication  Physical Exam: Blood pressure 123/77, pulse 76, temperature 98.3 F (36.8 C), temperature source Oral, resp. rate 20, height _0  (1.6 m), weight 64.9 kg, SpO2 97 %. Physical Exam HENT:     Head: Normocephalic and atraumatic.     Right Ear: External ear normal.     Left Ear: External ear normal.     Mouth/Throat:     Mouth: Mucous membranes are moist.     Comments: edentulous Eyes:     Conjunctiva/sclera: Conjunctivae normal.   Cardiovascular:     Rate and Rhythm: Normal rate and regular rhythm.     Heart sounds: No murmur heard.   No gallop.  Pulmonary:     Effort: Pulmonary effort is normal. No respiratory distress.     Breath sounds: No wheezing or rales.  Abdominal:     General: Abdomen is flat. There is no distension.     Palpations: Abdomen is soft. There is no mass.  Musculoskeletal:        General: No swelling or tenderness.     Cervical back: Normal range of motion.     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     Comments: Heavy bulky dressing in place to left foot, staples present, mild serosanguinous dressing. Skin in general very dry.   Neurological:     Mental Status: He is alert.     Comments: Patient is awake and alert.  Makes eye contact with examiner. Dysconjugate gaze. Nystagmus with left lateral gaze, right eye wanders with left gaze as well. Pt wearing tape over left lens of glasses. Mild left lid ptosis.   Left facial weakness. Provides his name and age with some delay in processing and word finding. Limited insight and awareness. He needed multiple cues for place.  Follows simple commands. RUE 4-/5. LUE 4+/5. RLE 3-4/5. LLE 3+/5 prox to distal, limited by wound on foot. Sensed pain in all 4's.   Psychiatric:        Mood and Affect: Mood normal.    Results for orders placed or performed during the hospital encounter of 10/09/20 (from the past 48 hour(s))  CBC     Status: Abnormal   Collection Time: 10/18/20 12:20 AM  Result Value Ref Range   WBC 6.1 4.0 - 10.5 K/uL   RBC 3.83 (L) 4.22 - 5.81 MIL/uL   Hemoglobin 9.4 (L) 13.0 - 17.0 g/dL   HCT 30.0 (L) 39.0 - 52.0 %   MCV 78.3 (L) 80.0 - 100.0 fL   MCH 24.5 (L) 26.0 - 34.0 pg   MCHC 31.3 30.0 - 36.0 g/dL   RDW 16.7 (H) 11.5 - 15.5 %   Platelets 310 150 - 400 K/uL   nRBC 0.0 0.0 - 0.2 %    Comment: Performed at  [facility], [place] [address], [place], [place] [address]   Magnesium     Status: None   Collection Time: 10/18/20 12:20 AM  Result Value Ref Range   Magnesium 1.7 1.7 - 2.4 mg/dL    Comment: Performed at [facility] [address] [place], [place] [address]  Basic metabolic panel     Status: Abnormal   Collection Time: 10/18/20 12:20 AM  Result Value Ref Range   Sodium 136 135 - 145 mmol/L   Potassium 4.1 3.5 - 5.1 mmol/L   Chloride 107 98 - 111 mmol/L   CO2 22 22 - 32 mmol/L   Glucose, Bld 115 (H) 70 - 99 mg/dL    Comment: Glucose reference range applies only to samples taken after fasting for at least 8 hours.   BUN 6 6 - 20 mg/dL   Creatinine, Ser 0.55 (L) 0.61 - 1.24 mg/dL   Calcium 9.1 8.9 - 10.3 mg/dL   GFR, Estimated >60 >60 mL/min    Comment: (NOTE) Calculated using the CKD-EPI Creatinine Equation (2021)    Anion gap 7 5 - 15    Comment: Performed at [facility] [address] [place], [place] [address]   No results found.     Medical Problem List and Plan: 1.  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  Antithrombotics: -DVT/anticoagulation: Lovenox  -antiplatelet therapy: Aspirin 81 mg daily and Plavix 75 mg day x3 weeks then Plavix alone.  Recommendations of 30-day cardiac event monitor 3. Pain Management: Neurontin 300 mg twice daily 4. Mood: Provide emotional support  -antipsychotic agents: N/A 5. Neuropsych: This patient is not capable of making decisions on his own behalf. 7. Fluids/Electrolytes/Nutrition: Routine in and outs with follow-up chemistries on admit 8.  Chronic left foot wound.  Status post left foot incision and drainage debridement of fourth met head resection 10/11/2020.   -Follow-up per Dr. Cannon Kettle podiatry services  as outpt -IV Ancef and Vibramycin course completed -continue daily dressing to foot 9.  Systolic congestive heart failure.  Follow-up cardiology service.  Aldactone resumed 25 mg daily.  Monitor for any signs of fluid overload  -daily weights 10.  Hypertension/nonsustained VT.  Lisinopril 5 mg daily, Imdur 60 mg daily, Toprol-XL 25 mg daily.  Monitor with increased mobility 11.  RA.  Chronic prednisone.  We will discuss resuming Arava 12.  High-level autism.  We will discuss baseline with family    Cathlyn Parsons, Hershal Coria 10/19/2020

## 2020-10-19 NOTE — Progress Notes (Signed)
Inpatient Rehab Admissions Coordinator:  ? ?I have a bed for this Pt. On CIR today. RN may call report to [phone]. ? ?Mariafernanda Hendricksen, MS, CCC-SLP ?Rehab Admissions Coordinator  ?[phone] (celll) ?[phone] (office) ?

## 2020-10-19 NOTE — Progress Notes (Signed)
EKG CRITICAL VALUE     12 lead EKG performed.  Critical value noted.  Einar Pheasant., RN notified.   Iseah Plouff H, CCT 10/19/2020 8:40 AM

## 2020-10-19 NOTE — Discharge Summary (Signed)
Physician Discharge Summary  Jonathan [name] QAS:[id number] DOB: 03/03/1961 DOA: 10/09/2020  PCP: System, Provider Not In  Admit date: 10/09/2020 Discharge date: 10/19/2020  Admitted From: Home Disposition: CIR  Recommendations for Outpatient Follow-up:  Patient be followed up by podiatry for further wound management Referral has been made for neurology follow-up Patient will need follow-up with cardiology after discharge from inpatient rehab He will need 30-day event monitor which cardiology can arrange once he is ready for discharge from the hospital  Discharge Condition: Stable CODE STATUS: Full code heart healthy Diet recommendation: Heart healthy  Brief/Interim Summary: 60 year old male, initially admitted to the hospital with infected foot ulcer.  Seen by podiatry and underwent operative management.  Hospital course further complicated by development of acute left MCA infarct.  He underwent mechanical thrombectomy.  Seen by physical therapy with recommendations for CIR. Plan is for discharge to CIR later today.  Discharge Diagnoses:  Principal Problem:   Acute osteomyelitis of ankle and foot ([place]) Active Problems:   Rheumatoid arthritis involving multiple sites with positive rheumatoid factor (HCC)   Coronary artery disease involving native coronary artery of native heart   Chronic foot ulcer with fat layer exposed, left ([place])   Acute ischemic left MCA stroke (HCC)   Ischemic stroke ([place])   Endotracheal tube present   Ischemic cardiomyopathy   Abnormal finding on EKG  Acute left MCA infarct due to left M1 occlusion -Status post mechanical thrombectomy -Neurology has signed off -LDL 101, continue Lipitor -A1c 5.5 -Continue dual antiplatelet therapy with aspirin and Plavix for 3 weeks, followed by Plavix alone -2D echo shows ejection fraction of 30 to 35% -Neurology has recommended 30-day cardiac event monitor on discharge to rule out occult A. Fib.  This can be arranged  by cardiology if they are contacted prior to discharge from the hospital -Seen by physical therapy with recommendation for CIR -Discussed with family and they would like to pursue CIR   EKG changes Cardiomyopathy -Initial concerns for possible STEMI on EKG due to diffuse ST elevations -Troponins negative -Seen by cardiology who felt EKG changes were likely related to stroke, no reciprocal ST depressions to suggest an actual ST elevation MI -Ejection fraction of 30 to 35% on echocardiogram, unchanged from prior -No plans on invasive angiography -Currently on beta-blockers, spironolactone, Imdur and lisinopril -Volume status appears to be euvolemic -We will use Lasix as needed   Nonsustained VT -Asymptomatic -Continue beta-blockers   Hypertension -Blood pressure is controlled, if not on the low end -We will continue cardiac medications with holding parameters for low blood pressure   Hyperlipidemia -Continue statin   Osteomyelitis of left foot -Status post left foot incision and drainage with debridement, fourth metatarsal head resection and placement of graft by podiatry -Cultures from procedure growing Proteus mirabilis and Staphylococcus cohnii -He has completed 10 days of IV antibiotics -Seen by vascular surgery during his hospital stay and it was not felt that patient had any evidence of lower extremity arterial disease -Care reviewed with Dr. Amalia Hailey, on-call for podiatry.  He will relay care plan to patient's primary podiatrist, Dr. Cannon Kettle.  Podiatry will follow up with the patient for further wound care after transfer to inpatient rehab -Currently wound management includes: Apply surgical lube and dry dressing to the graft site, leave the Adaptic and Steri-Strips in place (graft is underneath it) just put the lube on top and then cover with dry dressing to the left foot once daily   Rheumatoid arthritis -He is chronically on prednisone  5 mg twice daily as well as  leflunomide  Discharge Instructions  Discharge Instructions     Ambulatory referral to Neurology   Complete by: As directed    Follow up with stroke clinic NP (Jessica Vanschaick or Cecille Rubin, if both not available, consider Zachery Dauer, or [facility]) at [facility] in about 4 weeks. Thanks.   Diet - low sodium heart healthy   Complete by: As directed    Discharge wound care:   Complete by: As directed    Apply Surgilube and dry dressing to the graft site, leave the adaptic and steristrips in place (graft is underneath it) just put the lube on top and then cover with dry dressing to the left foot once daily   Increase activity slowly   Complete by: As directed       Allergies as of 10/19/2020       Reactions   Infliximab Nausea Only, Shortness Of Breath        Medication List     STOP taking these medications    celecoxib 200 MG capsule Commonly known as: CELEBREX   omeprazole 20 MG capsule Commonly known as: PRILOSEC       TAKE these medications    acetaminophen 325 MG tablet Commonly known as: TYLENOL Take 2 tablets (650 mg total) by mouth every 4 (four) hours as needed for headache or mild pain.   aspirin EC 81 MG tablet Take 1 tablet (81 mg total) by mouth daily.   atorvastatin 80 MG tablet Commonly known as: LIPITOR Take 1 tablet (80 mg total) by mouth daily. What changed:  medication strength how much to take Another medication with the same name was removed. Continue taking this medication, and follow the directions you see here.   cetirizine 10 MG tablet Commonly known as: ZYRTEC Take 10 mg by mouth daily as needed for allergies.   clopidogrel 75 MG tablet Commonly known as: PLAVIX Take 1 tablet (75 mg total) by mouth daily.   docusate sodium 100 MG capsule Commonly known as: COLACE Take 1 capsule (100 mg total) by mouth 2 (two) times daily. What changed:  when to take this reasons to take this   fluticasone 50 MCG/ACT nasal spray Commonly  known as: FLONASE Place 1 spray into the nose daily as needed for allergies.   furosemide 20 MG tablet Commonly known as: LASIX Take 1 tablet (20 mg total) by mouth daily as needed. What changed:  medication strength how much to take when to take this reasons to take this   gabapentin 300 MG capsule Commonly known as: NEURONTIN Take 300 mg by mouth 2 (two) times daily.   isosorbide mononitrate 60 MG 24 hr tablet Commonly known as: IMDUR Take 1 tablet (60 mg total) by mouth every evening. What changed: when to take this   leflunomide 10 MG tablet Commonly known as: ARAVA Take 1 tablet by mouth daily.   lisinopril 5 MG tablet Commonly known as: ZESTRIL Take 1 tablet (5 mg total) by mouth daily. What changed: how much to take   metoprolol succinate 25 MG 24 hr tablet Commonly known as: TOPROL-XL Take 1 tablet (25 mg total) by mouth every evening. What changed: when to take this   montelukast 10 MG tablet Commonly known as: SINGULAIR Take 10 mg by mouth at bedtime.   pantoprazole 40 MG tablet Commonly known as: PROTONIX Take 1 tablet (40 mg total) by mouth daily.   predniSONE 5 MG tablet Commonly known as: DELTASONE  Take 5 mg by mouth 2 (two) times daily with a meal.   spironolactone 25 MG tablet Commonly known as: ALDACTONE Take 25 mg by mouth daily.   triamcinolone cream 0.1 % Commonly known as: KENALOG Apply 1 application topically daily as needed (to flaky skin).               Discharge Care Instructions  (From admission, onward)           Start     Ordered   10/19/20 0000  Discharge wound care:       Comments: Apply Surgilube and dry dressing to the graft site, leave the adaptic and steristrips in place (graft is underneath it) just put the lube on top and then cover with dry dressing to the left foot once daily   10/19/20 1028            Follow-up Information     [facility]. Schedule an appointment as soon as  possible for a visit in 1 month(s).   Specialty: Neurology Why: stroke clinic Contact information: [place] [address] [phone]               Allergies  Allergen Reactions   Infliximab Nausea Only and Shortness Of Breath    Consultations: Podiatry Neurology Cardiology Critical care Interventional radiology Vascular surgery   Procedures/Studies: DG Abd 1 View  Result Date: 10/14/2020 CLINICAL DATA:  Orogastric tube placement. EXAM: ABDOMEN - 1 VIEW COMPARISON:  None. FINDINGS: Tip and side port of the enteric tube below the diaphragm in the stomach. Mild gaseous gastric distension. There is excreted IV contrast within the renal collecting systems. Right upper quadrant surgical clips typical of cholecystectomy. IMPRESSION: Tip and side port of the enteric tube below the diaphragm in the stomach. Electronically Signed   By: Keith Rake M.D.   On: 10/14/2020 21:51   CT HEAD WO CONTRAST  Result Date: 10/15/2020 CLINICAL DATA:  Stroke follow-up. Postop percutaneous clot retrieval. EXAM: CT HEAD WITHOUT CONTRAST TECHNIQUE: Contiguous axial images were obtained from the base of the skull through the vertex without intravenous contrast. COMPARISON:  MRI head 10/15/2020.  CT head 10/14/2020 FINDINGS: Brain: Hypodensity in the left basal ganglia compatible with acute infarct. This involves primarily the head of the caudate and putamen. Small amount of hemorrhage in the putamen is best seen on gradient echo imaging. Small acute infarct also in the left temporal lobe. Infarct distribution similar to the recent MRI. Diffusion-weighted imaging also demonstrated small areas of infarct in the left temporoparietal lobe. No large volume hemorrhage. Ventricle size normal. Mild edema in the head of the caudate with mass-effect on the left frontal horn. No midline shift. Vascular: Negative for hyperdense vessel Skull: Negative Sinuses/Orbits: Mucosal  edema and air-fluid levels in the paranasal sinuses. Patient is intubated. 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 Minimal petechial hemorrhage in the left putamen as noted on gradient echo imaging. No large area of hemorrhage. Electronically Signed   By: Franchot Gallo M.D.   On: 10/15/2020 10:22   CT Angio Chest PE W and/or Wo Contrast  Result Date: 09/26/2020 CLINICAL DATA:  Chest pain.  Effusion suspected. EXAM: CT ANGIOGRAPHY CHEST WITH CONTRAST TECHNIQUE: Multidetector CT imaging of the chest was performed using the standard protocol during bolus administration of intravenous contrast. Multiplanar CT image reconstructions and MIPs were obtained to evaluate the vascular anatomy. CONTRAST:  9m OMNIPAQUE  IOHEXOL 350 MG/ML SOLN COMPARISON:  None. FINDINGS: Cardiovascular: Satisfactory opacification of the pulmonary arteries to the proximal segmental level. No evidence of pulmonary embolism. Limited evaluation at the subsegmental level due to respiratory motion artifact. Normal heart size. No pericardial effusion. The thoracic aorta is normal in caliber. Mild atherosclerotic plaque. Mediastinum/Nodes: Bilateral axillary lymphadenopathy. No enlarged mediastinal or hilar lymph nodes. Thyroid gland, trachea, and esophagus demonstrate no significant findings. Lungs/Pleura: Slightly more consolidative airspace opacities at the lung bases. Diffuse, lower lobe predominant, peribronchovascular airspace opacities. Bilateral trace pleural effusions. No pneumothorax. Upper Abdomen: No acute abnormality. Musculoskeletal: No chest wall abnormality. Severe chronic degenerative changes of bilateral shoulders with cortical erosion and destruction of the femoral heads and acetabuli. Question underlying joint effusions. Review of the MIP images confirms the above findings. IMPRESSION: 1. No central or proximal segmental pulmonary embolus with limited evaluation  more distally due to motion artifact. 2. Diffuse, lower lobe predominant, peribronchovascular airspace opacities. Bilateral trace pleural effusions. Findings could represent pulmonary edema versus infection/inflammation. 3. Severe chronic degenerative changes of bilateral shoulders with cortical erosion and destruction of the femoral heads and acetabuli. Question underlying joint effusions. 4. Bilateral axillary lymphadenopathy. Electronically Signed   By: Iven Finn M.D.   On: 09/26/2020 03:48   MR BRAIN WO CONTRAST  Result Date: 10/15/2020 CLINICAL DATA:  Stroke follow-up EXAM: MRI HEAD WITHOUT CONTRAST TECHNIQUE: Multiplanar, multiecho pulse sequences of the brain and surrounding structures were obtained without intravenous contrast. COMPARISON:  Head CT from yesterday FINDINGS: Brain: Confluent restricted diffusion in the left stratum and in the left MCA inferior division cortex and white matter. Petechial hemorrhage is seen at the level of the affected basal ganglia. No hydrocephalus, mass, or collection. Vascular: Preserved flow voids. Skull and upper cervical spine: Normal marrow signal Sinuses/Orbits: Nasopharyngeal fluid in the setting of intubation. Negative orbits. Other: Bilateral parotid ductal dilatation without superimposed acute inflammation. Right mastoid opacification. These findings are considered incidental to the presentation. IMPRESSION: Acute infarction in the left MCA territory affecting lenticulostriates and inferior division cortex/white matter. Mild petechial hemorrhage at the level of the left basal ganglia. Electronically Signed   By: Monte Fantasia M.D.   On: 10/15/2020 08:45   CARDIAC CATHETERIZATION  Result Date: 09/28/2020 Conclusions: 1. Severe single-vessel coronary artery disease with chronic total occlusion of the mid RCA.  There is mild disease involving the left coronary artery.  Overall appearance is similar to 2019. 2. Mildly to moderately elevated left heart  filling pressure. 3. Normal cardiac output/index. Recommendations: 1. Continue medical therapy of acute on chronic HFrEF and ischemic heart disease. 2. Transfer back to [facility] after completion of post-catheterization recovery for ongoing medical management. Nelva Bush, MD [facility]   MR FOOT LEFT W WO CONTRAST  Result Date: 10/10/2020 CLINICAL DATA:  Ulcer between left second and third metatarsals, abnormal x-ray EXAM: MRI OF THE LEFT FOREFOOT WITHOUT AND WITH CONTRAST TECHNIQUE: Multiplanar, multisequence MR imaging of the left foot was performed both before and after administration of intravenous contrast. CONTRAST:  68m GADAVIST GADOBUTROL 1 MMOL/ML IV SOLN COMPARISON:  10/09/2020, 12/22/2018 FINDINGS: Bones/Joint/Cartilage Evaluation is limited due to excessive patient motion throughout the evaluation. Stable marrow edema is seen at the base of the fifth metatarsal and surrounding the first metatarsophalangeal and interphalangeal joints, most consistent with reactive changes due to arthropathy. Edema is also seen within the head of the fourth metatarsal, immediately adjacent to an area of soft tissue inflammation and ulceration. There is mild associated enhancement. No evidence of  cortical disruption or erosion. This area is suspicious for early osteomyelitis. Progressive degenerative changes are seen throughout the left foot. Progressive joint space narrowing and osteophyte formation within the midfoot, metatarsophalangeal joints, and throughout the interphalangeal joints. Ligaments Evaluation of the ligamentous structures is suboptimal due to field of view selection and patient motion during the exam. Muscles and Tendons Evaluation is limited due to patient motion and field of view selection. No gross tendinous disruption. Soft tissues There is soft tissue ulceration, edema, and enhancement in the plantar aspect of the forefoot at the level of the fourth metatarsal head. No fluid collection  or abscess. Edema is seen within the musculature surrounding the second and third metatarsals, compatible with reactive changes. No fluid collection or abscess. IMPRESSION: 1. Limited study due to patient motion throughout the exam. 2. Soft tissue ulceration and enhancement within the forefoot at the level of the fourth metatarsal head, with underlying marrow edema and enhancement within the fourth metatarsal. No cortical disruption. Findings favor early osteomyelitis. 3. Reactive edema within the fifth metatarsal and first digit as above, similar to prior study, and likely related to underlying degenerative change. 4. Nonspecific soft tissue edema within the musculature adjacent to the second and third metatarsals, likely reactive. No fluid collection or abscess. Electronically Signed   By: Randa Ngo M.D.   On: 10/10/2020 00:57   IR CT Head Ltd  Result Date: 10/15/2020 INDICATION: 60 year old male with past medical history significant for autism spectrum disorder, RA, systolic CHF, chronic venous stasis with chronic left foot ulcer and recent onychomycosis s/p debridement who was admitted to [facility] with left foot ulcer and osteomyelitis on Vanc and zosyn. He was found to have acute onset of aphasia and right-sided weakness. His last known well was 1 p.m. on 10/14/2020. Head CT showed no evidence of large acute territorial infarct or hemorrhage. CT angiogram of the head and neck showed a proximal left M2/MCA occlusion. CT perfusion showed a 49 mL penumbra without evidence of a core infarct. NIHSS at presentation was 26; baseline modified Rankin scale is 4 related to autism. No IV tPA administered as she was outside the window. He was transferred to [facility] for mechanical thrombectomy. EKG during transport was notable for ST Elevations. Discussed with Dr. Ellyn Hack from cardiology who believes the EKG changes are related to neurological process. Decision was made to proceed with mechanical  thrombectomy. EXAM: ULTRASOUND-GUIDED VASCULAR ACCESS DIAGNOSTIC CEREBRAL ANGIOGRAM FLAT PANEL HEAD CT COMPARISON:  CT/CT angiogram of the head and neck October 14, 2020. MEDICATIONS: Not applicable. ANESTHESIA/SEDATION: The procedure was performed under general anesthesia. CONTRAST:  35 mL of Omnipaque 240 milligram/mL FLUOROSCOPY TIME:  Fluoroscopy Time: 24 minutes 42 seconds (541 mGy). COMPLICATIONS: None immediate. TECHNIQUE: Informed written consent was obtained from the patient after a thorough discussion of the procedural risks, benefits and alternatives. All questions were addressed. Maximal Sterile Barrier Technique was utilized including caps, mask, sterile gowns, sterile gloves, sterile drape, hand hygiene and skin antiseptic. A timeout was performed prior to the initiation of the procedure. The right groin was prepped and draped in the usual sterile fashion. Using a micropuncture kit and the modified Seldinger technique, access was gained to the right common femoral artery and an 8 French sheath was placed. Real-time ultrasound guidance was utilized for vascular access including the acquisition of a permanent ultrasound image documenting patency of the accessed vessel. Under fluoroscopy, a Zoom 88 guide catheter was navigated over a 6 [place] Berenstein 2 catheter and a  0.035" Terumo Glidewire into the aortic arch. The catheter was placed into the left common carotid artery and then advanced into the left internal carotid artery. The inner catheter was removed. Frontal and lateral angiograms of the head were obtained. FINDINGS: 1. Patent right common femoral artery with adequate caliber for vascular access. 2. Severe tortuosity of the cervical left ICA and left carotid siphon. 3. Occlusion of the proximal left M2/MCA posterior division branch. PROCEDURE: Under biplane roadmap, a zoom 55 aspiration catheter was navigated over a phenom 21 microcatheter and a Aristotle 14 microguidewire into the cavernous  segment of the left ICA. The microcatheter was then navigated over the wire into the left M3/MCA posterior division branch. Then, a 4 x 40 mm solitaire stent retriever was deployed spanning the M2 segment. The device was allowed to intercalated with the clot for 4 minutes. The microcatheter was removed. The aspiration catheter was advanced to the level of occlusion and connected to a penumbra aspiration pump. The thrombectomy device and aspiration catheter were removed under constant aspiration. Left internal carotid artery angiograms with frontal and lateral views of the head showed complete recanalization of the left MCA vascular tree. Flat panel CT of the head was obtained and post processed in a separate workstation with concurrent attending physician supervision. Selected images were sent to PACS. No evidence of hemorrhagic complication. A right common femoral artery angiogram in right anterior oblique view was obtained via sheath side port. Atherosclerotic changes of the external iliac and right common femoral artery without hemodynamically significant stenosis. The vessel has adequate caliber for closure device utilization. The femoral sheath was exchanged over the wire for an 8 [place] Angio-Seal which was utilized for access closure. Immediate hemostasis was achieved. IMPRESSION: Successful mechanical thrombectomy performed for treatment of a proximal left M2/MCA posterior division branch occlusion. One pass performed with combined aspiration and stent retriever with complete recanalization (TICI 3). No thromboembolic or hemorrhagic complication. PLAN: Transferred to ICU for continued care. Electronically Signed   By: Pedro Earls M.D.   On: 10/15/2020 13:52   IR US Guide Vasc Access Right  Result Date: 10/15/2020 INDICATION: 60 year old male with past medical history significant for autism spectrum disorder, RA, systolic CHF, chronic venous stasis with chronic left foot ulcer and recent  onychomycosis s/p debridement who was admitted to [facility] with left foot ulcer and osteomyelitis on Vanc and zosyn. He was found to have acute onset of aphasia and right-sided weakness. His last known well was 1 p.m. on 10/14/2020. Head CT showed no evidence of large acute territorial infarct or hemorrhage. CT angiogram of the head and neck showed a proximal left M2/MCA occlusion. CT perfusion showed a 49 mL penumbra without evidence of a core infarct. NIHSS at presentation was 26; baseline modified Rankin scale is 4 related to autism. No IV tPA administered as she was outside the window. He was transferred to [facility] for mechanical thrombectomy. EKG during transport was notable for ST Elevations. Discussed with Dr. Ellyn Hack from cardiology who believes the EKG changes are related to neurological process. Decision was made to proceed with mechanical thrombectomy. EXAM: ULTRASOUND-GUIDED VASCULAR ACCESS DIAGNOSTIC CEREBRAL ANGIOGRAM FLAT PANEL HEAD CT COMPARISON:  CT/CT angiogram of the head and neck October 14, 2020. MEDICATIONS: Not applicable. ANESTHESIA/SEDATION: The procedure was performed under general anesthesia. CONTRAST:  35 mL of Omnipaque 240 milligram/mL FLUOROSCOPY TIME:  Fluoroscopy Time: 24 minutes 42 seconds (541 mGy). COMPLICATIONS: None immediate. TECHNIQUE: Informed written consent was obtained from the  patient after a thorough discussion of the procedural risks, benefits and alternatives. All questions were addressed. Maximal Sterile Barrier Technique was utilized including caps, mask, sterile gowns, sterile gloves, sterile drape, hand hygiene and skin antiseptic. A timeout was performed prior to the initiation of the procedure. The right groin was prepped and draped in the usual sterile fashion. Using a micropuncture kit and the modified Seldinger technique, access was gained to the right common femoral artery and an 8 French sheath was placed. Real-time ultrasound guidance was  utilized for vascular access including the acquisition of a permanent ultrasound image documenting patency of the accessed vessel. Under fluoroscopy, a Zoom 88 guide catheter was navigated over a 6 [place] Berenstein 2 catheter and a 0.035" Terumo Glidewire into the aortic arch. The catheter was placed into the left common carotid artery and then advanced into the left internal carotid artery. The inner catheter was removed. Frontal and lateral angiograms of the head were obtained. FINDINGS: 1. Patent right common femoral artery with adequate caliber for vascular access. 2. Severe tortuosity of the cervical left ICA and left carotid siphon. 3. Occlusion of the proximal left M2/MCA posterior division branch. PROCEDURE: Under biplane roadmap, a zoom 55 aspiration catheter was navigated over a phenom 21 microcatheter and a Aristotle 14 microguidewire into the cavernous segment of the left ICA. The microcatheter was then navigated over the wire into the left M3/MCA posterior division branch. Then, a 4 x 40 mm solitaire stent retriever was deployed spanning the M2 segment. The device was allowed to intercalated with the clot for 4 minutes. The microcatheter was removed. The aspiration catheter was advanced to the level of occlusion and connected to a penumbra aspiration pump. The thrombectomy device and aspiration catheter were removed under constant aspiration. Left internal carotid artery angiograms with frontal and lateral views of the head showed complete recanalization of the left MCA vascular tree. Flat panel CT of the head was obtained and post processed in a separate workstation with concurrent attending physician supervision. Selected images were sent to PACS. No evidence of hemorrhagic complication. A right common femoral artery angiogram in right anterior oblique view was obtained via sheath side port. Atherosclerotic changes of the external iliac and right common femoral artery without hemodynamically  significant stenosis. The vessel has adequate caliber for closure device utilization. The femoral sheath was exchanged over the wire for an 8 [place] Angio-Seal which was utilized for access closure. Immediate hemostasis was achieved. IMPRESSION: Successful mechanical thrombectomy performed for treatment of a proximal left M2/MCA posterior division branch occlusion. One pass performed with combined aspiration and stent retriever with complete recanalization (TICI 3). No thromboembolic or hemorrhagic complication. PLAN: Transferred to ICU for continued care. Electronically Signed   By: Pedro Earls M.D.   On: 10/15/2020 13:52   DG CHEST PORT 1 VIEW  Result Date: 10/15/2020 CLINICAL DATA:  Respiratory failure EXAM: PORTABLE CHEST 1 VIEW COMPARISON:  09/26/2020 FINDINGS: The endotracheal tube is at the carina. Nasogastric tube extends into the upper abdomen beyond the margin of the examination. Lungs are clear. No pneumothorax or pleural effusion. Cardiac size within normal limits. No acute bone abnormality. IMPRESSION: Endotracheal tube at the carina. Withdrawal by roughly 2 cm would better position the catheter within the distal trachea. Electronically Signed   By: Fidela Salisbury MD   On: 10/15/2020 01:55   DG Chest Portable 1 View  Result Date: 09/26/2020 CLINICAL DATA:  Shortness of breath EXAM: PORTABLE CHEST 1 VIEW COMPARISON:  09/07/2020  FINDINGS: Moderate cardiomegaly. Bilateral basilar predominant interstitial opacities. No pleural effusion or pneumothorax. IMPRESSION: Cardiomegaly and bilateral basilar predominant interstitial opacities, likely pulmonary edema. Electronically Signed   By: Ulyses Jarred M.D.   On: 09/26/2020 03:13   DG Foot Complete Left  Result Date: 10/09/2020 CLINICAL DATA:  Infection. Ulcer between left second and third metatarsals. EXAM: LEFT FOOT - COMPLETE 3+ VIEW COMPARISON:  Radiograph 12/21/2018 FINDINGS: Progressive lateral subluxation of the second  through fourth digits from prior exam. There is some irregularity of the second and third rays at the metatarsal phalangeal joints. Irregularity about the second metatarsal head has progressed from prior, no frank bony destruction. There is progressive joint space irregularity involving the first metatarsal phalangeal joint with decreased density at the articular surface. Small erosion at the first interphalangeal joint medially. Pes planus and chronic arthropathy of the midfoot with spurring and loss of normal Boehler's angle. Chronic tibial talar degenerative change. Site of wound is not well-defined by radiograph. There is no radiopaque foreign body. No tracking soft tissue air. IMPRESSION: 1. Articular irregularity of the first, second and third metatarsal phalangeal joints. This appears progressed at the first and second rays from 2020. Given the underlying chronic changes, recommend MRI for more detailed assessment. 2. Small erosion at the first interphalangeal joint. 3. 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 Electronically Signed   By: Keith Rake M.D.   On: 10/09/2020 20:55   VAS [place] ABI WITH/WO TBI  Result Date: 10/10/2020  LOWER EXTREMITY DOPPLER STUDY Patient Name:  Jonathan [name]  Date of Exam:   10/10/2020 Medical Rec #: [id number]         Accession #:    [id number] Date of Birth: Oct 05, 1960        Patient Gender: M Patient Age:   59Y Exam Location:  [facility] Procedure:      VAS [place] ABI WITH/WO TBI Referring Phys: [id number] TITORYA STOVER --------------------------------------------------------------------------------  Indications: Ulceration, and acute osteomyelitis- LLE ankle & foot. High Risk Factors: Past history of smoking, coronary artery disease. Other Factors: CHF, chronic venous stasis, chronic venous ulcerations.  Vascular Interventions: Venous ablation surgeries. Comparison Study: Segmental Pressure 11/28/18 - WNL Performing Technologist: Rogelia Rohrer RVT,  RDMS  Examination Guidelines: A complete evaluation includes at minimum, Doppler waveform signals and systolic blood pressure reading at the level of bilateral brachial, anterior tibial, and posterior tibial arteries, when vessel segments are accessible. Bilateral testing is considered an integral part of a complete examination. Photoelectric Plethysmograph (PPG) waveforms and toe systolic pressure readings are included as required and additional duplex testing as needed. Limited examinations for reoccurring indications may be performed as noted.  ABI Findings: +--------+------------------+-----+---------+--------+ Right   Rt Pressure (mmHg)IndexWaveform Comment  +--------+------------------+-----+---------+--------+ Brachial                       triphasicIV       +--------+------------------+-----+---------+--------+ PTA     140               1.28 triphasic         +--------+------------------+-----+---------+--------+ DP      145               1.33 triphasic         +--------+------------------+-----+---------+--------+ +--------+------------------+-----+---------+-------+ Left    Lt Pressure (mmHg)IndexWaveform Comment +--------+------------------+-----+---------+-------+ [id number]                    triphasic        +--------+------------------+-----+---------+-------+  PTA     146               1.34 triphasic        +--------+------------------+-----+---------+-------+ DP      140               1.28 triphasic        +--------+------------------+-----+---------+-------+ +-------+-----------+-----------+------------+------------+ ABI/TBIToday's ABIToday's TBIPrevious ABIPrevious TBI +-------+-----------+-----------+------------+------------+ Right  1.33                  0.98        0.79         +-------+-----------+-----------+------------+------------+ Left   1.34                  1.01        0.75          +-------+-----------+-----------+------------+------------+  Toes bandaged on LLE.  Summary: BLE ABIs are elevated, likely due to vessel calcification. Doppler waveforms are triphasic throughout.  *See table(s) above for measurements and observations  Electronically signed by Harold Barban MD on 10/10/2020 at 11:35:27 PM.    Final    ECHOCARDIOGRAM COMPLETE  Result Date: 09/26/2020    ECHOCARDIOGRAM REPORT   [name] Date of Exam: 09/26/2020 Medical Rec #:  [id number]        Height:       63.0 in Accession #:    [id number]       Weight:       140.0 lb Date of Birth:  Dec 23, 1960       BSA:          1.662 m Patient Age:    47 years         BP:           109/60 mmHg Patient Gender: M                HR:           93 bpm. Exam Location:  Inpatient Procedure: 2D Echo, Cardiac Doppler and Color Doppler Indications:    CHF I50.9  History:        Patient has prior history of Echocardiogram examinations, most                 recent 08/22/2019. Previous Myocardial Infarction and CAD.  Sonographer:    Merrie Roof RDCS Referring Phys: [id number] [place]  1. Left ventricular ejection fraction, by estimation, is 30 to 35%. The left ventricle has moderately decreased function. The left ventricle demonstrates regional wall motion abnormalities. Inferior/inferoseptal akinesis. The left ventricular internal cavity size was mildly dilated. Left ventricular diastolic parameters are consistent with Grade II diastolic dysfunction (pseudonormalization). Elevated left atrial pressure.  2. Right ventricular systolic function is normal. The right ventricular size is normal. Tricuspid regurgitation signal is inadequate for assessing PA pressure.  3. Left atrial size was mildly dilated.  4. The aortic valve was not well visualized. Aortic valve regurgitation is mild to moderate. Eccentric posterior directed AI jet, visually appears mild but may be underestimating given eccentric jet, moderate by PHT. No  aortic stenosis is present.  5. The inferior vena cava is normal in size with greater than 50% respiratory variability, suggesting right atrial pressure of 3 mmHg.  6. The mitral valve is normal in structure. No evidence of mitral valve regurgitation. FINDINGS  Left Ventricle: Left ventricular ejection fraction, by estimation, is 30 to 35%. The left ventricle has moderately decreased function. The left  ventricle demonstrates regional wall motion abnormalities. The left ventricular internal cavity size was mildly dilated. There is no left ventricular hypertrophy. Left ventricular diastolic parameters are consistent with Grade II diastolic dysfunction (pseudonormalization). Elevated left atrial pressure. Right Ventricle: The right ventricular size is normal. No increase in right ventricular wall thickness. Right ventricular systolic function is normal. Tricuspid regurgitation signal is inadequate for assessing PA pressure. Left Atrium: Left atrial size was mildly dilated. Right Atrium: Right atrial size was normal in size. Pericardium: Trivial pericardial effusion is present. Mitral Valve: The mitral valve is normal in structure. No evidence of mitral valve regurgitation. Tricuspid Valve: The tricuspid valve is normal in structure. Tricuspid valve regurgitation is trivial. Aortic Valve: The aortic valve was not well visualized. Aortic valve regurgitation is mild to moderate. No aortic stenosis is present. Pulmonic Valve: The pulmonic valve was not well visualized. Pulmonic valve regurgitation is not visualized. Aorta: The aortic root is normal in size and structure. Venous: The inferior vena cava is normal in size with greater than 50% respiratory variability, suggesting right atrial pressure of 3 mmHg. IAS/Shunts: The interatrial septum was not well visualized.  LEFT VENTRICLE PLAX 2D LVIDd:         5.90 cm      Diastology LVIDs:         4.80 cm      LV e' medial:    5.33 cm/s LV PW:         0.80 cm      LV E/e'  medial:  16.3 LV IVS:        0.60 cm      LV e' lateral:   5.77 cm/s LVOT diam:     2.10 cm      LV E/e' lateral: 15.1 LV SV:         58 LV SV Index:   35 LVOT Area:     3.46 cm  LV Volumes (MOD) LV vol d, MOD A2C: 197.0 ml LV vol d, MOD A4C: 205.0 ml LV vol s, MOD A2C: 147.0 ml LV vol s, MOD A4C: 120.5 ml LV SV MOD A2C:     50.0 ml LV SV MOD A4C:     205.0 ml LV SV MOD BP:      67.1 ml RIGHT VENTRICLE RV Basal diam:  3.70 cm LEFT ATRIUM             Index       RIGHT ATRIUM           Index LA diam:        3.70 cm 2.23 cm/m  RA Area:     18.10 cm LA Vol (A2C):   77.3 ml 46.52 ml/m RA Volume:   46.90 ml  28.22 ml/m LA Vol (A4C):   46.6 ml 28.04 ml/m LA Biplane Vol: 62.2 ml 37.43 ml/m  AORTIC VALVE LVOT Vmax:   88.80 cm/s LVOT Vmean:  56.500 cm/s LVOT VTI:    0.168 m  AORTA Ao Root diam: 3.10 cm MITRAL VALVE MV Area (PHT): 6.12 cm    SHUNTS MV Decel Time: 124 msec    Systemic VTI:  0.17 m MV E velocity: 87.00 cm/s  Systemic Diam: 2.10 cm MV A velocity: 94.70 cm/s MV E/A ratio:  0.92 Oswaldo Milian MD Electronically signed by Oswaldo Milian MD Signature Date/Time: 09/26/2020/2:24:59 PM    Final    IR PERCUTANEOUS ART THROMBECTOMY/INFUSION INTRACRANIAL INC DIAG ANGIO  Result Date: 10/15/2020 INDICATION: 60 year old male with past medical  history significant for autism spectrum disorder, RA, systolic CHF, chronic venous stasis with chronic left foot ulcer and recent onychomycosis s/p debridement who was admitted to [facility] with left foot ulcer and osteomyelitis on Vanc and zosyn. He was found to have acute onset of aphasia and right-sided weakness. His last known well was 1 p.m. on 10/14/2020. Head CT showed no evidence of large acute territorial infarct or hemorrhage. CT angiogram of the head and neck showed a proximal left M2/MCA occlusion. CT perfusion showed a 49 mL penumbra without evidence of a core infarct. NIHSS at presentation was 26; baseline modified Rankin scale is 4 related to  autism. No IV tPA administered as she was outside the window. He was transferred to [facility] for mechanical thrombectomy. EKG during transport was notable for ST Elevations. Discussed with Dr. Ellyn Hack from cardiology who believes the EKG changes are related to neurological process. Decision was made to proceed with mechanical thrombectomy. EXAM: ULTRASOUND-GUIDED VASCULAR ACCESS DIAGNOSTIC CEREBRAL ANGIOGRAM FLAT PANEL HEAD CT COMPARISON:  CT/CT angiogram of the head and neck October 14, 2020. MEDICATIONS: Not applicable. ANESTHESIA/SEDATION: The procedure was performed under general anesthesia. CONTRAST:  35 mL of Omnipaque 240 milligram/mL FLUOROSCOPY TIME:  Fluoroscopy Time: 24 minutes 42 seconds (541 mGy). COMPLICATIONS: None immediate. TECHNIQUE: Informed written consent was obtained from the patient after a thorough discussion of the procedural risks, benefits and alternatives. All questions were addressed. Maximal Sterile Barrier Technique was utilized including caps, mask, sterile gowns, sterile gloves, sterile drape, hand hygiene and skin antiseptic. A timeout was performed prior to the initiation of the procedure. The right groin was prepped and draped in the usual sterile fashion. Using a micropuncture kit and the modified Seldinger technique, access was gained to the right common femoral artery and an 8 French sheath was placed. Real-time ultrasound guidance was utilized for vascular access including the acquisition of a permanent ultrasound image documenting patency of the accessed vessel. Under fluoroscopy, a Zoom 88 guide catheter was navigated over a 6 [place] Berenstein 2 catheter and a 0.035" Terumo Glidewire into the aortic arch. The catheter was placed into the left common carotid artery and then advanced into the left internal carotid artery. The inner catheter was removed. Frontal and lateral angiograms of the head were obtained. FINDINGS: 1. Patent right common femoral artery with adequate  caliber for vascular access. 2. Severe tortuosity of the cervical left ICA and left carotid siphon. 3. Occlusion of the proximal left M2/MCA posterior division branch. PROCEDURE: Under biplane roadmap, a zoom 55 aspiration catheter was navigated over a phenom 21 microcatheter and a Aristotle 14 microguidewire into the cavernous segment of the left ICA. The microcatheter was then navigated over the wire into the left M3/MCA posterior division branch. Then, a 4 x 40 mm solitaire stent retriever was deployed spanning the M2 segment. The device was allowed to intercalated with the clot for 4 minutes. The microcatheter was removed. The aspiration catheter was advanced to the level of occlusion and connected to a penumbra aspiration pump. The thrombectomy device and aspiration catheter were removed under constant aspiration. Left internal carotid artery angiograms with frontal and lateral views of the head showed complete recanalization of the left MCA vascular tree. Flat panel CT of the head was obtained and post processed in a separate workstation with concurrent attending physician supervision. Selected images were sent to PACS. No evidence of hemorrhagic complication. A right common femoral artery angiogram in right anterior oblique view was obtained via sheath side  port. Atherosclerotic changes of the external iliac and right common femoral artery without hemodynamically significant stenosis. The vessel has adequate caliber for closure device utilization. The femoral sheath was exchanged over the wire for an 8 [place] Angio-Seal which was utilized for access closure. Immediate hemostasis was achieved. IMPRESSION: Successful mechanical thrombectomy performed for treatment of a proximal left M2/MCA posterior division branch occlusion. One pass performed with combined aspiration and stent retriever with complete recanalization (TICI 3). No thromboembolic or hemorrhagic complication. PLAN: Transferred to ICU for continued  care. Electronically Signed   By: Pedro Earls M.D.   On: 10/15/2020 13:52   CT HEAD CODE STROKE WO CONTRAST`  Result Date: 10/14/2020 CLINICAL DATA:  Code stroke. Right facial droop. Right-sided weakness. EXAM: CT HEAD WITHOUT CONTRAST TECHNIQUE: Contiguous axial images were obtained from the base of the skull through the vertex without intravenous contrast. COMPARISON:  None. FINDINGS: Brain: Abnormal edema and loss of gray-white differentiation in the left basal ganglia (including the caudate and lentiform nucleus), concerning for acute or subacute infarct. Mild associated mass effect with approximately 2 mm of rightward midline shift at the foramen of Monro. Vascular: No hyperdense vessel identified. Calcific atherosclerosis. Skull: No acute fracture. Sinuses/Orbits: Clear visualized sinuses.  Unremarkable orbits. Other: Small right mastoid effusion. ASPECTS [facility] Stroke Program Early CT Score) - Ganglionic level infarction (caudate, lentiform nuclei, internal capsule, insula, M1-M3 cortex): 5 - Supraganglionic infarction (M4-M6 cortex): 3 Total score (0-10 with 10 being normal): 8 IMPRESSION: 1. Abnormal edema and loss of gray-white differentiation in the left basal ganglia (including the caudate and lentiform nucleus), concerning for acute or subacute infarct. Recommend MRI to confirm and exclude other etiologies. 2. Mild associated mass effect with approximately 2 mm of rightward midline shift at the foramen of Monro. 3. No acute hemorrhage. Findings discussed with Dr. Dwyane Dee via telephone at 5:18 p.m. Electronically Signed   By: Margaretha Sheffield MD   On: 10/14/2020 17:22   ECHOCARDIOGRAM LIMITED  Result Date: 10/15/2020    ECHOCARDIOGRAM LIMITED REPORT   [name] Date of Exam: 10/15/2020 Medical Rec #:  [id number]        Height:       63.0 in Accession #:    [id number]       Weight:       138.9 lb Date of Birth:  26-Jan-1961       BSA:          1.656 m  Patient Age:    63 years         BP:           130/61 mmHg Patient Gender: M                HR:           78 bpm. Exam Location:  Inpatient Procedure: Limited Echo, Limited Color Doppler and Cardiac Doppler Indications:    stroke. coronary artery disease.  History:        Patient has prior history of Echocardiogram examinations, most                 recent 09/26/2020. CHF, CAD; Risk Factors:Dyslipidemia.  Sonographer:    Johny Chess Referring Phys: 40 Mabel  Sonographer Comments: Echo performed with patient supine and on artificial respirator. IMPRESSIONS  1. Akinesis of the inferior and inferolateral walls; remaining walls hypokinetic; overall moderate to severe LV dysfunction.  2. Left ventricular ejection fraction, by estimation, is 30  to 35%. The left ventricle has moderate to severely decreased function. The left ventricle demonstrates regional wall motion abnormalities (see scoring diagram/findings for description). The left ventricular internal cavity size was mildly dilated. Left ventricular diastolic parameters are consistent with Grade II diastolic dysfunction (pseudonormalization). Elevated left atrial pressure.  3. Right ventricular systolic function is normal. The right ventricular size is normal.  4. The mitral valve is grossly normal. Trivial mitral valve regurgitation.  5. The aortic valve is tricuspid. Aortic valve regurgitation is moderate. Mild aortic valve sclerosis is present, with no evidence of aortic valve stenosis. FINDINGS  Left Ventricle: Left ventricular ejection fraction, by estimation, is 30 to 35%. The left ventricle has moderate to severely decreased function. The left ventricle demonstrates regional wall motion abnormalities. The left ventricular internal cavity size was mildly dilated. There is no left ventricular hypertrophy. Left ventricular diastolic parameters are consistent with Grade II diastolic dysfunction (pseudonormalization). Elevated left atrial pressure.  Right Ventricle: The right ventricular size is normal. Right ventricular systolic function is normal. Left Atrium: Left atrial size was normal in size. Right Atrium: Right atrial size was normal in size. Pericardium: There is no evidence of pericardial effusion. Mitral Valve: The mitral valve is grossly normal. There is mild thickening of the mitral valve leaflet(s). Trivial mitral valve regurgitation. Tricuspid Valve: The tricuspid valve is normal in structure. Tricuspid valve regurgitation is trivial. Aortic Valve: The aortic valve is tricuspid. Aortic valve regurgitation is moderate. Aortic regurgitation PHT measures 260 msec. Mild aortic valve sclerosis is present, with no evidence of aortic valve stenosis. Pulmonic Valve: The pulmonic valve was grossly normal. Pulmonic valve regurgitation is trivial. IAS/Shunts: The interatrial septum was not well visualized. Additional Comments: Akinesis of the inferior and inferolateral walls; remaining walls hypokinetic; overall moderate to severe LV dysfunction. LEFT VENTRICLE PLAX 2D LVIDd:         5.40 cm  Diastology LVIDs:         5.00 cm  LV e' medial:    5.00 cm/s LV PW:         1.00 cm  LV E/e' medial:  26.2 LV IVS:        0.90 cm  LV e' lateral:   7.18 cm/s LVOT diam:     2.30 cm  LV E/e' lateral: 18.2 LV SV:         93 LV SV Index:   56 LVOT Area:     4.15 cm  IVC IVC diam: 1.90 cm LEFT ATRIUM         Index LA diam:    3.30 cm 1.99 cm/m  AORTIC VALVE LVOT Vmax:   125.00 cm/s LVOT Vmean:  78.400 cm/s LVOT VTI:    0.223 m AI PHT:      260 msec  AORTA Ao Root diam: 3.20 cm Ao Asc diam:  3.00 cm MITRAL VALVE MV Area (PHT): 4.60 cm     SHUNTS MV Decel Time: 165 msec     Systemic VTI:  0.22 m MV E velocity: 131.00 cm/s  Systemic Diam: 2.30 cm Kirk Ruths MD Electronically signed by Kirk Ruths MD Signature Date/Time: 10/15/2020/9:20:08 AM    Final    CT ANGIO HEAD NECK W WO CM W PERF (CODE STROKE)  Result Date: 10/14/2020 CLINICAL DATA:  Neuro deficit, acute  stroke suspected. EXAM: CT ANGIOGRAPHY HEAD AND NECK CT PERFUSION BRAIN TECHNIQUE: Multidetector CT imaging of the head and neck was performed using the standard protocol during bolus administration of intravenous contrast. Multiplanar  CT image reconstructions and MIPs were obtained to evaluate the vascular anatomy. Carotid stenosis measurements (when applicable) are obtained utilizing NASCET criteria, using the distal internal carotid diameter as the denominator. Multiphase CT imaging of the brain was performed following IV bolus contrast injection. Subsequent parametric perfusion maps were calculated using RAPID software. CONTRAST:  167m OMNIPAQUE IOHEXOL 350 MG/ML SOLN COMPARISON:  None. FINDINGS: CTA NECK FINDINGS Aortic arch: Great vessel origins are patent. Mild narrowing of the left subclavian artery origin. Right carotid system: No evidence of dissection, stenosis (50% or greater) or occlusion. Tortuous ICA with retropharyngeal course. Mild atherosclerosis at the carotid bifurcation. Left carotid system: No evidence of dissection, stenosis (50% or greater) or occlusion. Mild atherosclerosis at the carotid bifurcation. Vertebral arteries: Right dominant. The non dominant left vertebral artery is small throughout its course with small transverse foramen. Right vertebral artery is patent without significant (greater than 50%) stenosis. Skeleton: Other neck: Enlarged bilateral parotid ducts, left greater than right without definite radiodense calculus, although evaluation is limited on the CTA. Upper chest: Layering bilateral pleural effusions in the visualized lung apices with overlying ground-glass opacities Review of the MIP images confirms the above findings CTA HEAD FINDINGS Anterior circulation: Bilateral ICAs are patent with calcific atherosclerosis, but no significant (greater than 50%) stenosis. Left M1 MCAs patent. 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 Right MCA and bilateral ACAs are patent. No aneurysm identified. Posterior circulation: Bilateral intradural vertebral arteries and the basilar artery are patent. Bilateral fetal type PCAs without evidence of proximal hemodynamically significant PCA stenosis. Limited evaluation of the distal PCAs due to venous contamination. No aneurysm identified. Venous sinuses: As permitted by contrast timing, patent. Anatomic variants: See above. Review of the MIP images confirms the above findings CT Brain Perfusion Findings: ASPECTS: 8 CBF (<30%) Volume: 062mPerfusion (Tmax>6.0s) volume: 492mismatch Volume: 13m94mhe amount of penumbra may be underestimated given the more superior left MCA territory is not imaged on perfusion Infarction Location:None identified IMPRESSION: 1. 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 Associated 49 mL of penumbra in the left MCA territory. The volume of penumbra may be underestimated given the more superior left MCA territory is not imaged on perfusion. No evidence of core infarct on RAPID analysis. 2. Layering bilateral pleural effusions in the visualized lung apices with overlying ground-glass opacities, potentially atelectasis. Recommend dedicated chest imaging to further characterize. 3. Significantly enlarged bilateral parotid ducts, left greater than right without definite radiodense calculus, although evaluation is limited on this CTA. Bilateral overlying subcutaneous stranding could represent anasarca, although parotiditis is not excluded. Recommend clinical correlation and consideration of follow-up CT neck (preferably with contrast) after resolution of the patient's emergent issues. Critical findings discussed with Dr. StacQuinn Axe5:44 p.m. via telephone. Electronically Signed   By: FredMargaretha Sheffield  On:  10/14/2020 18:14      Subjective: Patient is feeling better today.  No new complaints.  No chest pain or shortness of breath.  Discharge Exam: Vitals:   10/19/20 0012 10/19/20 0449 10/19/20 0500 10/19/20 0926  BP: (!) 103/53 (!) 107/52  123/77  Pulse: 74 71  76  Resp: 19 18  20   Temp: 98.3 F (36.8 C) 98.1 F (36.7 C)  98.3 F (36.8 C)  TempSrc: Oral Oral  Oral  SpO2: 99%  99%  97%  Weight:   64.9 kg   Height:        General: Pt is alert, awake, not in acute distress Cardiovascular: RRR, S1/S2 +, no rubs, no gallops Respiratory: CTA bilaterally, no wheezing, no rhonchi Abdominal: Soft, NT, ND, bowel sounds + Extremities: no edema, no cyanosis    The results of significant diagnostics from this hospitalization (including imaging, microbiology, ancillary and laboratory) are listed below for reference.     Microbiology: Recent Results (from the past 240 hour(s))  SARS CORONAVIRUS 2 (TAT 6-24 HRS) Nasopharyngeal Nasopharyngeal Swab     Status: None   Collection Time: 10/09/20  9:57 PM   Specimen: Nasopharyngeal Swab  Result Value Ref Range Status   SARS Coronavirus 2 NEGATIVE NEGATIVE Final    Comment: (NOTE) SARS-CoV-2 target nucleic acids are NOT DETECTED.  The SARS-CoV-2 RNA is generally detectable in upper and lower respiratory specimens during the acute phase of infection. Negative results do not preclude SARS-CoV-2 infection, do not rule out co-infections with other pathogens, and should not be used as the sole basis for treatment or other patient management decisions. Negative results must be combined with clinical observations, patient history, and epidemiological information. The expected result is Negative.  Fact Sheet for Patients: SugarRoll.be  Fact Sheet for Healthcare Providers: https://www.woods-mathews.com/  This test is not yet approved or cleared by the [place] FDA and  has been authorized for  detection and/or diagnosis of SARS-CoV-2 by FDA under an Emergency Use Authorization (EUA). This EUA will remain  in effect (meaning this test can be used) for the duration of the COVID-19 declaration under Se ction 564(b)(1) of the Act, 21 U.S.C. section 360bbb-3(b)(1), unless the authorization is terminated or revoked sooner.  Performed at [facility], [place] [address] [place], [place] [address]   Surgical pcr screen     Status: None   Collection Time: 10/10/20  3:34 PM   Specimen: Nasal Mucosa; Nasal Swab  Result Value Ref Range Status   MRSA, PCR NEGATIVE NEGATIVE Final   Staphylococcus aureus NEGATIVE NEGATIVE Final    Comment: (NOTE) The Xpert SA Assay (FDA approved for NASAL specimens in patients 72 years of age and older), is one component of a comprehensive surveillance program. It is not intended to diagnose infection nor to guide or monitor treatment. Performed at [facility], [place] [address], [place], [place] [address]   Aerobic/Anaerobic Culture w Gram Stain (surgical/deep wound)     Status: None   Collection Time: 10/11/20 11:06 AM   Specimen: Wound  Result Value Ref Range Status   Specimen Description   Final    WOUND LEFT FOOT Performed at [place] [address] [place], [place] [address]    Special Requests   Final    PATIENT ON FOLLOWING Cephas Darby Performed at [facility], [place] [address] [place], [place] [address]    Gram Stain NO WBC SEEN RARE GRAM POSITIVE COCCI   Final   Culture   Final    RARE PROTEUS MIRABILIS RARE STAPHYLOCOCCUS COHNII NO ANAEROBES ISOLATED Performed at [facility], [place] [address] [place], [place] [address]    Report Status 10/17/2020 FINAL  Final   Organism ID, Bacteria PROTEUS MIRABILIS  Final   Organism ID, Bacteria STAPHYLOCOCCUS COHNII  Final      Susceptibility   Proteus mirabilis - MIC*    AMPICILLIN <=2 SENSITIVE Sensitive     CEFAZOLIN  <=4  SENSITIVE Sensitive     CEFEPIME <=0.12 SENSITIVE Sensitive     CEFTAZIDIME <=1 SENSITIVE Sensitive     CEFTRIAXONE <=0.25 SENSITIVE Sensitive     CIPROFLOXACIN <=0.25 SENSITIVE Sensitive     GENTAMICIN <=1 SENSITIVE Sensitive     IMIPENEM 2 SENSITIVE Sensitive     TRIMETH/SULFA <=20 SENSITIVE Sensitive     AMPICILLIN/SULBACTAM <=2 SENSITIVE Sensitive     PIP/TAZO <=4 SENSITIVE Sensitive     * RARE PROTEUS MIRABILIS   Staphylococcus cohnii - MIC*    CIPROFLOXACIN <=0.5 SENSITIVE Sensitive     ERYTHROMYCIN 0.5 SENSITIVE Sensitive     GENTAMICIN <=0.5 SENSITIVE Sensitive     OXACILLIN 0.5 RESISTANT Resistant     TETRACYCLINE <=1 SENSITIVE Sensitive     VANCOMYCIN 1 SENSITIVE Sensitive     TRIMETH/SULFA <=10 SENSITIVE Sensitive     CLINDAMYCIN <=0.25 SENSITIVE Sensitive     RIFAMPIN <=0.5 SENSITIVE Sensitive     Inducible Clindamycin NEGATIVE Sensitive     * RARE STAPHYLOCOCCUS COHNII  MRSA Next Gen by PCR, Nasal     Status: None   Collection Time: 10/14/20  8:55 PM   Specimen: Nasal Mucosa; Nasal Swab  Result Value Ref Range Status   MRSA by PCR Next Gen NOT DETECTED NOT DETECTED Final    Comment: (NOTE) The GeneXpert MRSA Assay (FDA approved for NASAL specimens only), is one component of a comprehensive MRSA colonization surveillance program. It is not intended to diagnose MRSA infection nor to guide or monitor treatment for MRSA infections. Test performance is not FDA approved in patients less than 57 years old. Performed at [facility], [place] [address] [place], [place] [address]      Labs: BNP (last 3 results) Recent Labs    09/26/20 0133  BNP 093.2*   Basic Metabolic Panel: Recent Labs  Lab 10/14/20 0321 10/14/20 2157 10/15/20 0500 10/15/20 2104 10/16/20 0557 10/17/20 0105 10/17/20 0913 10/18/20 0020  NA 137   < > 139 133* 137 137  --  136  K 4.3   < > 3.8 4.6 4.8 5.7* 3.4* 4.1  CL 108  --  109 103 105 108  --  107  CO2 22  --  20* 13* 19* 15*   --  22  GLUCOSE 107*  --  88 65* 63* 92  --  115*  BUN 10  --  7 5* 5* 9  --  6  CREATININE 0.53*  --  0.62 0.67 0.68 0.69  --  0.55*  CALCIUM 9.1  --  9.2 9.2 9.4 9.3  --  9.1  MG 1.8  --  1.8  --  2.0  --   --  1.7   < > = values in this interval not displayed.   Liver Function Tests: Recent Labs  Lab 10/15/20 0500  AST 30  ALT 21  ALKPHOS 69  BILITOT 0.8  PROT 6.1*  ALBUMIN 2.4*   No results for input(s): LIPASE, AMYLASE in the last 168 hours. No results for input(s): AMMONIA in the last 168 hours. CBC: Recent Labs  Lab 10/14/20 0321 10/14/20 2157 10/15/20 0500 10/16/20 0557 10/17/20 0105 10/18/20 0020  WBC 7.0  --  6.1 12.0* 7.5 6.1  HGB 8.5* 8.2* 9.1* 9.7* 10.0* 9.4*  HCT 27.5* 24.0* 29.4* 30.8* 30.4* 30.0*  MCV 78.8*  --  79.0* 78.2* 75.6* 78.3*  PLT 284  --  312 318 285 310   Cardiac Enzymes: No results for input(s): CKTOTAL,  CKMB, CKMBINDEX, TROPONINI in the last 168 hours. BNP: Invalid input(s): POCBNP CBG: Recent Labs  Lab 10/14/20 1658 10/17/20 0745  GLUCAP 115* 91   D-Dimer No results for input(s): DDIMER in the last 72 hours. Hgb A1c No results for input(s): HGBA1C in the last 72 hours. Lipid Profile No results for input(s): CHOL, HDL, LDLCALC, TRIG, CHOLHDL, LDLDIRECT in the last 72 hours. Thyroid function studies No results for input(s): TSH, T4TOTAL, T3FREE, THYROIDAB in the last 72 hours.  Invalid input(s): FREET3 Anemia work up No results for input(s): VITAMINB12, FOLATE, FERRITIN, TIBC, IRON, RETICCTPCT in the last 72 hours. Urinalysis    Component Value Date/Time   COLORURINE YELLOW 10/15/2020 0408   APPEARANCEUR CLEAR 10/15/2020 0408   LABSPEC >1.046 (H) 10/15/2020 0408   PHURINE 5.0 10/15/2020 0408   GLUCOSEU NEGATIVE 10/15/2020 0408   HGBUR NEGATIVE 10/15/2020 0408   BILIRUBINUR NEGATIVE 10/15/2020 0408   KETONESUR NEGATIVE 10/15/2020 0408   PROTEINUR NEGATIVE 10/15/2020 0408   UROBILINOGEN 0.2 07/09/2013 1801   NITRITE  NEGATIVE 10/15/2020 0408   LEUKOCYTESUR NEGATIVE 10/15/2020 0408   Sepsis Labs Invalid input(s): PROCALCITONIN,  WBC,  LACTICIDVEN Microbiology Recent Results (from the past 240 hour(s))  SARS CORONAVIRUS 2 (TAT 6-24 HRS) Nasopharyngeal Nasopharyngeal Swab     Status: None   Collection Time: 10/09/20  9:57 PM   Specimen: Nasopharyngeal Swab  Result Value Ref Range Status   SARS Coronavirus 2 NEGATIVE NEGATIVE Final    Comment: (NOTE) SARS-CoV-2 target nucleic acids are NOT DETECTED.  The SARS-CoV-2 RNA is generally detectable in upper and lower respiratory specimens during the acute phase of infection. Negative results do not preclude SARS-CoV-2 infection, do not rule out co-infections with other pathogens, and should not be used as the sole basis for treatment or other patient management decisions. Negative results must be combined with clinical observations, patient history, and epidemiological information. The expected result is Negative.  Fact Sheet for Patients: SugarRoll.be  Fact Sheet for Healthcare Providers: https://www.woods-mathews.com/  This test is not yet approved or cleared by the [place] FDA and  has been authorized for detection and/or diagnosis of SARS-CoV-2 by FDA under an Emergency Use Authorization (EUA). This EUA will remain  in effect (meaning this test can be used) for the duration of the COVID-19 declaration under Se ction 564(b)(1) of the Act, 21 U.S.C. section 360bbb-3(b)(1), unless the authorization is terminated or revoked sooner.  Performed at [facility], [place] [address], [place], [place] [address]   Surgical pcr screen     Status: None   Collection Time: 10/10/20  3:34 PM   Specimen: Nasal Mucosa; Nasal Swab  Result Value Ref Range Status   MRSA, PCR NEGATIVE NEGATIVE Final   Staphylococcus aureus NEGATIVE NEGATIVE Final    Comment: (NOTE) The Xpert SA Assay (FDA approved for NASAL  specimens in patients 44 years of age and older), is one component of a comprehensive surveillance program. It is not intended to diagnose infection nor to guide or monitor treatment. Performed at [facility], [place] [address], [place], [place] [address]   Aerobic/Anaerobic Culture w Gram Stain (surgical/deep wound)     Status: None   Collection Time: 10/11/20 11:06 AM   Specimen: Wound  Result Value Ref Range Status   Specimen Description   Final    WOUND LEFT FOOT Performed at [place] [address], [place], [place] [address]    Special Requests   Final    PATIENT  ON FOLLOWING Cephas Darby Performed at [facility], [place] [address] [place], [place] [address]    Gram Stain NO WBC SEEN RARE GRAM POSITIVE COCCI   Final   Culture   Final    RARE PROTEUS MIRABILIS RARE STAPHYLOCOCCUS COHNII NO ANAEROBES ISOLATED Performed at [facility], [place] [address], [place], [place] [address]    Report Status 10/17/2020 FINAL  Final   Organism ID, Bacteria PROTEUS MIRABILIS  Final   Organism ID, Bacteria STAPHYLOCOCCUS COHNII  Final      Susceptibility   Proteus mirabilis - MIC*    AMPICILLIN <=2 SENSITIVE Sensitive     CEFAZOLIN <=4 SENSITIVE Sensitive     CEFEPIME <=0.12 SENSITIVE Sensitive     CEFTAZIDIME <=1 SENSITIVE Sensitive     CEFTRIAXONE <=0.25 SENSITIVE Sensitive     CIPROFLOXACIN <=0.25 SENSITIVE Sensitive     GENTAMICIN <=1 SENSITIVE Sensitive     IMIPENEM 2 SENSITIVE Sensitive     TRIMETH/SULFA <=20 SENSITIVE Sensitive     AMPICILLIN/SULBACTAM <=2 SENSITIVE Sensitive     PIP/TAZO <=4 SENSITIVE Sensitive     * RARE PROTEUS MIRABILIS   Staphylococcus cohnii - MIC*    CIPROFLOXACIN <=0.5 SENSITIVE Sensitive     ERYTHROMYCIN 0.5 SENSITIVE Sensitive     GENTAMICIN <=0.5 SENSITIVE Sensitive     OXACILLIN 0.5 RESISTANT Resistant     TETRACYCLINE <=1 SENSITIVE Sensitive     VANCOMYCIN 1 SENSITIVE Sensitive      TRIMETH/SULFA <=10 SENSITIVE Sensitive     CLINDAMYCIN <=0.25 SENSITIVE Sensitive     RIFAMPIN <=0.5 SENSITIVE Sensitive     Inducible Clindamycin NEGATIVE Sensitive     * RARE STAPHYLOCOCCUS COHNII  MRSA Next Gen by PCR, Nasal     Status: None   Collection Time: 10/14/20  8:55 PM   Specimen: Nasal Mucosa; Nasal Swab  Result Value Ref Range Status   MRSA by PCR Next Gen NOT DETECTED NOT DETECTED Final    Comment: (NOTE) The GeneXpert MRSA Assay (FDA approved for NASAL specimens only), is one component of a comprehensive MRSA colonization surveillance program. It is not intended to diagnose MRSA infection nor to guide or monitor treatment for MRSA infections. Test performance is not FDA approved in patients less than 69 years old. Performed at [facility], [place] [address] [place], [place] [address]      Time coordinating discharge: 78mns  SIGNED:   JKathie Dike MD  [facility] 10/19/2020, 10:28 AM   If 7PM-7AM, please contact night-coverage [email or web address]

## 2020-10-19 NOTE — Progress Notes (Signed)
Pt arrive to 4M09 per bed. Vitals obtained, assessment completed. Policies reviewed, oriented to rehab. No complications noted.  Sheela Stack, LPN

## 2020-10-19 NOTE — Care Management Important Message (Signed)
Important Message  Patient Details  Name: Jonathan [name] MRN: [id number] Date of Birth: Feb 23, 1961   Medicare Important Message Given:  Yes     Abigayle Wilinski P Otho 10/19/2020, 2:45 PM

## 2020-10-19 NOTE — H&P (Signed)
Physical Medicine and Rehabilitation Admission H&P        Chief Complaint  Patient presents with   Wound Infection  : HPI: Jonathan [name] is a 60 year old right-handed male with history significant for autism spectrum disorder, RA maintained on Arava and prednisone, right knee and hip arthroplasty as well as left total hip arthroplasty 0350, systolic congestive heart failure, chronic venous stasis with chronic left foot ulcer with recent onchomycosis with debridement.  Patient with recent admission 09/26/2020 to 09/29/2020 for exacerbation of CHF.  Per chart review patient lives with cousin for the last 68 years and did family support.  1 level home 5 steps to entry.  Ambulates short distances with a cane needing assist for ADLs.  Presented 10/09/2020 with ischemic changes of left foot maintained on vancomycin and Zosyn.  Patient underwent left foot incision and drainage with debridement fourth met head resection placement of graft 10/11/2020 per podiatry Dr. Cannon Kettle.  ABIs completed postoperatively showing normal triphasic waveforms in both lower extremities with some elevated pressures suggesting calcification of the vessels.  Vascular surgery Dr. Donnetta Hutching consulted showing no evidence of arterial disease no need for further follow-up at that time.  Patient did complete a 7-day course of IV Ancef for wound care as well as Vibramycin.  On 10/14/2020 patient with acute onset of right side weakness and aphasia.  Cranial CT scan showed abnormal edema and loss of gray-white differentiation in the left basal ganglia concerning for acute or subacute infarct.  No acute hemorrhage.  CT angiogram of the head showed occlusion versus severe stenosis of proximal left M2 MCA with irregular distal opacification.  Patient underwent diagnostic cerebral angiogram with mechanical thrombectomy 10/14/2020 per interventional radiology.  MRI of the brain showed acute infarct left MCA territory affecting lenticulostriate's and  inferior division cortex/white matter.  Mild petechial hemorrhage at the level of the left basal ganglia.  Echocardiogram with ejection fraction of 30 to 35%.  The left ventricle showed moderate to severely decreased function.  The left ventricle demonstrated regional wall motion abnormalities with grade 2 diastolic dysfunction.  Recommendations of 30-day cardiac event monitor as outpatient.  Patient was cleared to begin aspirin and Plavix for CVA prophylaxis x3 weeks then Plavix alone.  Subcutaneous Lovenox for DVT prophylaxis.  Cardiology services continues to follow for CHF and Aldactone has been resumed.  Troponins mildly elevated 25-26-30 felt to be related to demand ischemia.  Patient is tolerating a regular consistency diet.  Therapy evaluations completed due to patient's right-sided weakness and aphasia was admitted for a comprehensive rehab program.   Review of Systems Constitutional:  Negative for chills and fever. HENT:  Negative for hearing loss.   Eyes:  Negative for blurred vision and double vision. Respiratory:  Positive for shortness of breath. Negative for cough.   Cardiovascular:  Positive for palpitations and leg swelling. Negative for chest pain. Gastrointestinal:  Positive for constipation. Negative for heartburn, nausea and vomiting. Genitourinary:  Negative for dysuria, flank pain and hematuria. Musculoskeletal:  Positive for back pain, joint pain and myalgias. Skin:  Negative for rash. Neurological:  Positive for speech change and weakness.  All other systems reviewed and are negative.     Past Medical History:  Diagnosis Date   Anemia      H/o using iron in the past   Arthritis      rheumatoid   Chronic back pain     Chronic HFrEF (heart failure with reduced ejection fraction) ([place]) 08/2019  Admitted 6 4-7/20/2022 with CHF   Coronary artery disease, occlusive 08/01/2019    RCA CTO with left-to-right collaterals.  Widely patent LCA-large LAD was several small  diagonal branches and small diffusely diseased LCx with multiple OM branches.   Hyperlipidemia     Ischemic cardiomyopathy 09/02/2019    Initial EF was 40 to 45%, as of June 2022 EF now 30 to 35%.  Known RCA occlusion.  There is inferior and inferoseptal akinesis on echo.   Joint pain     Joint swelling     Rheumatoid arthritis(714.0)           Past Surgical History:  Procedure Laterality Date   COLONOSCOPY N/A 04/03/2013    Procedure: COLONOSCOPY;  Surgeon: Irene Shipper, MD;  Location: [facility];  Service: Endoscopy;  Laterality: N/A;   COLONOSCOPY       ENDOVENOUS ABLATION SAPHENOUS VEIN W/ LASER Left 09/26/2019    endovenous laser ablation left greater saphenous vein by Gae Gallop MD   ENDOVENOUS ABLATION SAPHENOUS VEIN W/ LASER Right 10/31/2019    endovenous laser ablation right greater saphenous vein by Gae Gallop MD   ESOPHAGOGASTRODUODENOSCOPY N/A 04/03/2013    Procedure: ESOPHAGOGASTRODUODENOSCOPY (EGD);  Surgeon: Irene Shipper, MD;  Location: [facility];  Service: Endoscopy;  Laterality: N/A;  changed to moderate dr. Henrene Pastor did not want to go to the o.r. for an endo colon-jmt   EYE SURGERY Left      "when i was a very small boy"   GRAFT APPLICATION Left 01/00/7121    Procedure: GRAFT APPLICATION;  Surgeon: Landis Martins, DPM;  Location: [facility];  Service: Podiatry;  Laterality: Left;  [place] (rep already contacted: Matt Sides)   INCISION AND DRAINAGE OF WOUND Left 10/11/2020    Procedure: IRRIGATION AND DEBRIDEMENT WOUND;  Surgeon: Landis Martins, DPM;  Location: [facility];  Service: Podiatry;  Laterality: Left;   IR CT HEAD LTD   10/14/2020   IR PERCUTANEOUS ART THROMBECTOMY/INFUSION INTRACRANIAL INC DIAG ANGIO   10/14/2020   IR US GUIDE VASC ACCESS RIGHT   10/14/2020   JOINT REPLACEMENT        both knees   KNEE ARTHROPLASTY   06/07/2011    Procedure: COMPUTER ASSISTED TOTAL KNEE ARTHROPLASTY;  Surgeon: Mcarthur Rossetti, MD;  Location: [facility];  Service: Orthopedics;   Laterality: Right;  Right total knee arthoplasty   KNEE CLOSED REDUCTION   07/19/2011    Procedure: CLOSED MANIPULATION KNEE;  Surgeon: Mcarthur Rossetti, MD;  Location: [facility];  Service: Orthopedics;  Laterality: Right;  Manipulation under anesthesia right knee   LEFT HEART CATH AND CORONARY ANGIOGRAPHY N/A 08/21/2019    Procedure: LEFT HEART CATH AND CORONARY ANGIOGRAPHY;  Surgeon: Belva Crome, MD;  Location: [facility];  Service: Cardiovascular; Mid to distal RCA CTO with faint left-to-right collaterals.  Widely patent left main.  Mid to distal LCx mild luminal irregularities.  Multiple OM branches.  Large draping LAD with several small diagonal branches.  EF ~40 to 45% with inferior AK.   METATARSAL HEAD EXCISION Left 10/11/2020    Procedure: METATARSAL HEAD EXCISION;  Surgeon: Landis Martins, DPM;  Location: [facility];  Service: Podiatry;  Laterality: Left;  4th metatarsal   RADIOLOGY WITH ANESTHESIA N/A 10/14/2020    Procedure: IR WITH ANESTHESIA;  Surgeon: Radiologist, Medication, MD;  Location: [facility];  Service: Radiology;  Laterality: N/A;   RIGHT/LEFT HEART CATH AND CORONARY ANGIOGRAPHY N/A 09/28/2020    Procedure: RIGHT/LEFT HEART CATH  AND CORONARY ANGIOGRAPHY;  Surgeon: Nelva Bush, MD;  Location: [facility];  Service: Cardiovascular;; Single-vessel CAD with CTO of mid RCA.  Mild disease involving the LCA (LAD & LCx w/ multiple OM).  Stable since 2021.  Mild to mod elevated LVEDP.  Normal-mildly reduced CO/CI - 5.2, 3.3 ([facility]), 4.6, 2.8 by (TD). RA 7 , RV 32/7, PA 29/20, PCWP 22.   TOTAL HIP ARTHROPLASTY Right 11/11/2014    Procedure: RIGHT TOTAL HIP ARTHROPLASTY ANTERIOR APPROACH;  Surgeon: Mcarthur Rossetti, MD;  Location: [facility];  Service: Orthopedics;  Laterality: Right;   TOTAL HIP ARTHROPLASTY Left 01/29/2015    Procedure: LEFT TOTAL HIP ARTHROPLASTY ANTERIOR APPROACH;  Surgeon: Mcarthur Rossetti, MD;  Location: [facility];  Service: Orthopedics;   Laterality: Left;   TRANSTHORACIC ECHOCARDIOGRAM   08/22/2019    EF 50 to 55%.  GR 1 DD. Mild Inferior Hypokinesis.   TRANSTHORACIC ECHOCARDIOGRAM   09/27/2019    EF 30-35%. Moderately reduced LVEF with Inferior/Inferoseptal Akinesis. Elevated LAP with mild LA dilation - Gr II DD. Mild-Mod AI, no AS.         Family History  Problem Relation Age of Onset   Cancer Mother          ?   Hypertension Mother     Heart failure Father          heart transplant   Cancer Brother     Anesthesia problems Neg Hx     Colon cancer Neg Hx          mother had "3" types of CA- unsure about which one   Esophageal cancer Neg Hx     Rectal cancer Neg Hx     Stomach cancer Neg Hx      Social History:  reports that he quit smoking about 16 years ago. His smoking use included cigarettes. He has never used smokeless tobacco. He reports that he does not drink alcohol and does not use drugs. Allergies:      Allergies  Allergen Reactions   Infliximab Nausea Only and Shortness Of Breath          Medications Prior to Admission  Medication Sig Dispense Refill   acetaminophen (TYLENOL) 325 MG tablet Take 2 tablets (650 mg total) by mouth every 4 (four) hours as needed for headache or mild pain. 30 tablet 0   aspirin EC 81 MG tablet Take 1 tablet (81 mg total) by mouth daily. 90 tablet 3   atorvastatin (LIPITOR) 40 MG tablet Take 40 mg by mouth daily.       celecoxib (CELEBREX) 200 MG capsule Take by mouth.       cetirizine (ZYRTEC) 10 MG tablet Take 10 mg by mouth daily as needed for allergies.       docusate sodium (COLACE) 100 MG capsule Take 1 capsule (100 mg total) by mouth 2 (two) times daily. (Patient taking differently: Take 100 mg by mouth daily as needed for mild constipation.) 30 capsule 0   fluticasone (FLONASE) 50 MCG/ACT nasal spray Place 1 spray into the nose daily as needed for allergies.       furosemide (LASIX) 40 MG tablet Take 1 tablet (40 mg total) by mouth daily. 30 tablet 0   gabapentin  (NEURONTIN) 300 MG capsule Take 300 mg by mouth 2 (two) times daily.       isosorbide mononitrate (IMDUR) 60 MG 24 hr tablet Take 1 tablet (60 mg total) by mouth daily. 30 tablet 0  leflunomide (ARAVA) 10 MG tablet Take 1 tablet by mouth daily.       lisinopril (ZESTRIL) 5 MG tablet Take 10 mg by mouth daily.       metoprolol succinate (TOPROL-XL) 25 MG 24 hr tablet Take 25 mg by mouth daily.       montelukast (SINGULAIR) 10 MG tablet Take 10 mg by mouth at bedtime.       omeprazole (PRILOSEC) 20 MG capsule Take 20 mg by mouth daily.       predniSONE (DELTASONE) 5 MG tablet Take 5 mg by mouth 2 (two) times daily with a meal.       spironolactone (ALDACTONE) 25 MG tablet Take 25 mg by mouth daily.       triamcinolone (KENALOG) 0.1 % Apply 1 application topically daily as needed (to flaky skin).          Drug Regimen Review Drug regimen was reviewed and remains appropriate with no significant issues identified   Home: Home Living Family/patient expects to be discharged to:: Private residence Living Arrangements: Other (Comment) Available Help at Discharge: Family, Available 24 hours/day Type of Home: House Home Access: Stairs to enter [employer] of Steps: 5 Entrance Stairs-Rails: Right, Left Home Layout: One level Bathroom Shower/Tub: Chiropodist: Standard Bathroom Accessibility: Yes Home Equipment: Geneticist, molecular, Shower seat, Bedside commode, Hospital bed, Wheelchair - power Additional Comments: has a lift chair that heps him stand. has a hospital bed that he elevates as well for standing. - all information from prior charting prior to onset of aphasia. Pt unable to provide info, but chart review indicates that he lives wtih his cousin and had aids 7 days/week   Functional History: Prior Function Level of Independence: Needs assistance Gait / Transfers Assistance Needed: per chart review, pt was ambulating with crutches ADL's / Homemaking Assistance  Needed: aide comes daily for assistance with bathing and dressing, getting out of bed,  longer utensiles for feeding, Comments: ambulates with crutches   Functional Status:  Mobility: Bed Mobility Overal bed mobility: Needs Assistance Bed Mobility: Supine to Sit, Sit to Supine, Rolling Rolling: Min assist Supine to sit: Mod assist, HOB elevated Sit to supine: Mod assist, HOB elevated General bed mobility comments: minA to complete rolling, slight assist to initiate, pt with good effort and completing most of roll without use of bed rails. modA to complete movement of BLE to EOB, modA to elevate trunk to sitting Transfers Overall transfer level: Needs assistance Equipment used: 1 person hand held assist Transfers: Sit to/from Stand, Lateral/Scoot Transfers Sit to Stand: Total assist Stand pivot transfers: Min guard  Lateral/Scoot Transfers: Total assist General transfer comment: deferred due to pt request to return to supine, reporting headache in sitting Ambulation/Gait Ambulation/Gait assistance: Min assist Gait Distance (Feet): 40 Feet Assistive device: Crutches Gait Pattern/deviations: Step-through pattern, Decreased stride length, Decreased weight shift to right General Gait Details: deferred due to onset pt headache in sitting and inability to stand   ADL: ADL Overall ADL's : Needs assistance/impaired Eating/Feeding: Total assistance, Sitting, Bed level Eating/Feeding Details (indicate cue type and reason): REquires set up and assistance to feed. At home has specialized utensils. Grooming: Maximal assistance, Bed level, Sitting Upper Body Bathing: Total assistance, Sitting, Bed level Lower Body Bathing: Total assistance, Sitting/lateral leans Upper Body Dressing : Total assistance, Sitting, Bed level Lower Body Dressing: Total assistance, Bed level Toilet Transfer: Total assistance Toilet Transfer Details (indicate cue type and reason): unable to attempt due to dizziness  and headache Toileting- Clothing Manipulation and Hygiene: Total assistance, Bed level Toileting - Clothing Manipulation Details (indicate cue type and reason): pt incontinent of stool.  He was assisted with peri care bed level Functional mobility during ADLs: Total assistance   Cognition: Cognition Overall Cognitive Status: Difficult to assess Arousal/Alertness: Awake/alert Orientation Level: Oriented to person Attention: Sustained Sustained Attention: Impaired Awareness: Impaired Awareness Impairment: Emergent impairment (unaware of errors) Problem Solving: Impaired Problem Solving Impairment: Functional basic Safety/Judgment: Impaired Cognition Arousal/Alertness: Lethargic Behavior During Therapy: Flat affect Overall Cognitive Status: Difficult to assess Area of Impairment: Following commands, Safety/judgement, Awareness, Orientation, Memory Orientation Level: Disoriented to, Time (pt stating yes to "is it nighttime") Memory: Decreased short-term memory Following Commands: Follows one step commands inconsistently, Follows one step commands with increased time Safety/Judgement: Decreased awareness of safety, Decreased awareness of deficits General Comments: difficult to assess due to expressive aphasia, pt stating yes to many questions during session, but able to answer a few open-ended questions such as "where is it hurting?" with pt responding "head" the pt follwed commands for LE movement well this session, slightly increased processing.  He was also able to tell me he is at [employer], and it is June. Difficult to assess due to: Impaired communication   Physical Exam: Blood pressure 123/77, pulse 76, temperature 98.3 F (36.8 C), temperature source Oral, resp. rate 20, height _0  (1.6 m), weight 64.9 kg, SpO2 97 %. Physical Exam HENT:    Head: Normocephalic and atraumatic.    Right Ear: External ear normal.    Left Ear: External ear normal.    Mouth/Throat:    Mouth: Mucous  membranes are moist.    Comments: edentulous Eyes:    Conjunctiva/sclera: Conjunctivae normal. Cardiovascular:    Rate and Rhythm: Normal rate and regular rhythm.    Heart sounds: No murmur heard.   No gallop. Pulmonary:    Effort: Pulmonary effort is normal. No respiratory distress.    Breath sounds: No wheezing or rales. Abdominal:    General: Abdomen is flat. There is no distension.    Palpations: Abdomen is soft. There is no mass. Musculoskeletal:        General: No swelling or tenderness.    Cervical back: Normal range of motion.    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    Comments: Heavy bulky dressing in place to left foot, staples present, mild serosanguinous dressing. Skin in general very dry.   Neurological:    Mental Status: He is alert.    Comments: Patient is awake and alert.  Makes eye contact with examiner. Dysconjugate gaze. Nystagmus with left lateral gaze, right eye wanders with left gaze as well. Pt wearing tape over left lens of glasses. Mild left lid ptosis.   Left facial weakness. Provides his name and age with some delay in processing and word finding. Limited insight and awareness. He needed multiple cues for place.  Follows simple commands. RUE 4-/5. LUE 4+/5. RLE 3-4/5. LLE 3+/5 prox to distal, limited by wound on foot. Sensed pain in all 4's.   Psychiatric:        Mood and Affect: Mood normal.     Lab Results Last 48 Hours        Results for orders placed or performed during the hospital encounter of 10/09/20 (from the past 48 hour(s))  CBC     Status: Abnormal    Collection Time: 10/18/20 12:20 AM  Result Value Ref Range    WBC 6.1 4.0 - 10.5 K/uL    RBC 3.83 (L) 4.22 - 5.81 MIL/uL    Hemoglobin 9.4 (L) 13.0 - 17.0 g/dL    HCT 30.0 (L) 39.0 - 52.0 %    MCV 78.3 (L) 80.0 - 100.0 fL    MCH 24.5 (L) 26.0 - 34.0 pg    MCHC 31.3 30.0 - 36.0 g/dL    RDW 16.7 (H) 11.5 - 15.5 %    Platelets 310 150 -  400 K/uL    nRBC 0.0 0.0 - 0.2 %      Comment: Performed at [facility] [address] [place], [place] [address]  Magnesium     Status: None    Collection Time: 10/18/20 12:20 AM  Result Value Ref Range    Magnesium 1.7 1.7 - 2.4 mg/dL      Comment: Performed at [facility] [address], [place], [place] [address]  Basic metabolic panel     Status: Abnormal    Collection Time: 10/18/20 12:20 AM  Result Value Ref Range    Sodium 136 135 - 145 mmol/L    Potassium 4.1 3.5 - 5.1 mmol/L    Chloride 107 98 - 111 mmol/L    CO2 22 22 - 32 mmol/L    Glucose, Bld 115 (H) 70 - 99 mg/dL      Comment: Glucose reference range applies only to samples taken after fasting for at least 8 hours.    BUN 6 6 - 20 mg/dL    Creatinine, Ser 0.55 (L) 0.61 - 1.24 mg/dL    Calcium 9.1 8.9 - 10.3 mg/dL    GFR, Estimated >60 >60 mL/min      Comment: (NOTE) Calculated using the CKD-EPI Creatinine Equation (2021)      Anion gap 7 5 - 15      Comment: Performed at [facility] [address] [place], [place] [address]      Imaging Results (Last 48 hours)  No results found.           Medical Problem List and Plan: 1.  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  Antithrombotics: -DVT/anticoagulation: Lovenox             -antiplatelet therapy: Aspirin 81 mg daily and Plavix 75 mg day x3 weeks then Plavix alone.  Recommendations of 30-day cardiac event monitor 3. Pain Management: Neurontin 300 mg twice daily 4. Mood: Provide emotional support             -antipsychotic agents: N/A 5. Neuropsych: This patient is not capable of making decisions on his own behalf. 7.  Fluids/Electrolytes/Nutrition: Routine in and outs with follow-up chemistries on admit 8.  Chronic left foot wound.  Status post left foot incision and drainage debridement of fourth met head resection 10/11/2020.   -Follow-up per Dr. Cannon Kettle podiatry services as outpt -IV Ancef and Vibramycin course completed -continue daily dressing to foot 9.  Systolic congestive heart failure.  Follow-up cardiology service.  Aldactone resumed 25 mg daily.  Monitor for  any signs of fluid overload             -daily weights 10.  Hypertension/nonsustained VT.  Lisinopril 5 mg daily, Imdur 60 mg daily, Toprol-XL 25 mg daily.  Monitor with increased mobility 11.  RA.  Chronic prednisone.  We will discuss resuming Arava 12.  High-level autism.  We will discuss baseline with family     Cathlyn Parsons, PA-C 10/19/2020   I have personally performed a face to face diagnostic evaluation of this patient and formulated the key components of the plan.  Additionally, I have personally reviewed laboratory data, imaging studies, as well as relevant notes and concur with the physician assistant's documentation above.  The patient's status has not changed from the original H&P.  Any changes in documentation from the acute care chart have been noted above.  Meredith Staggers, MD, [facility]

## 2020-10-20 LAB — COMPREHENSIVE METABOLIC PANEL
ALT: 11 U/L (ref 0–44)
AST: 27 U/L (ref 15–41)
Albumin: 2.4 g/dL — ABNORMAL LOW (ref 3.5–5.0)
Alkaline Phosphatase: 65 U/L (ref 38–126)
Anion gap: 7 (ref 5–15)
BUN: 8 mg/dL (ref 6–20)
CO2: 24 mmol/L (ref 22–32)
Calcium: 9.5 mg/dL (ref 8.9–10.3)
Chloride: 105 mmol/L (ref 98–111)
Creatinine, Ser: 0.48 mg/dL — ABNORMAL LOW (ref 0.61–1.24)
GFR, Estimated: >60 mL/min (ref >60)
Glucose, Bld: 108 mg/dL — ABNORMAL HIGH (ref 70–99)
Potassium: 4 mmol/L (ref 3.5–5.1)
Sodium: 136 mmol/L (ref 135–145)
Total Bilirubin: 0.3 mg/dL (ref 0.3–1.2)
Total Protein: 5.5 g/dL — ABNORMAL LOW (ref 6.5–8.1)

## 2020-10-20 LAB — CBC WITH DIFFERENTIAL/PLATELET
Abs Immature Granulocytes: 0.06 10*3/uL (ref 0.00–0.07)
Basophils Absolute: 0 10*3/uL (ref 0.0–0.1)
Basophils Relative: 1 %
Eosinophils Absolute: 0.5 10*3/uL (ref 0.0–0.5)
Eosinophils Relative: 8 %
HCT: 29.9 % — ABNORMAL LOW (ref 39.0–52.0)
Hemoglobin: 9.3 g/dL — ABNORMAL LOW (ref 13.0–17.0)
Immature Granulocytes: 1 %
Lymphocytes Relative: 17 %
Lymphs Abs: 1 10*3/uL (ref 0.7–4.0)
MCH: 24.6 pg — ABNORMAL LOW (ref 26.0–34.0)
MCHC: 31.1 g/dL (ref 30.0–36.0)
MCV: 79.1 fL — ABNORMAL LOW (ref 80.0–100.0)
Monocytes Absolute: 0.6 10*3/uL (ref 0.1–1.0)
Monocytes Relative: 10 %
Neutro Abs: 4 10*3/uL (ref 1.7–7.7)
Neutrophils Relative %: 63 %
Platelets: 308 10*3/uL (ref 150–400)
RBC: 3.78 MIL/uL — ABNORMAL LOW (ref 4.22–5.81)
RDW: 17 % — ABNORMAL HIGH (ref 11.5–15.5)
WBC: 6.2 10*3/uL (ref 4.0–10.5)
nRBC: 0 % (ref 0.0–0.2)

## 2020-10-20 MED ORDER — JUVEN PO PACK
1.0000 | PACK | Freq: Two times a day (BID) | ORAL | Status: DC
  Administered 2020-10-20 – 2020-10-30 (×18): 1 via ORAL
  Filled 2020-10-20 (×20): qty 1

## 2020-10-20 MED ORDER — LISINOPRIL 5 MG PO TABS
2.5000 mg | ORAL_TABLET | Freq: Every day | ORAL | Status: DC
  Administered 2020-10-21 – 2020-10-22 (×2): 2.5 mg via ORAL
  Filled 2020-10-20 (×2): qty 1

## 2020-10-20 MED ORDER — HYDROCERIN EX CREA
TOPICAL_CREAM | Freq: Two times a day (BID) | CUTANEOUS | Status: DC
  Filled 2020-10-20: qty 113

## 2020-10-20 NOTE — Evaluation (Signed)
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  Widely patent LCA-large LAD was several small diagonal branches and small diffusely diseased LCx with multiple OM branches.   Hyperlipidemia    Ischemic cardiomyopathy 09/02/2019   Initial EF was 40 to 45%, as of June 2022 EF now 30 to 35%.  Known RCA occlusion.  There is inferior and inferoseptal akinesis on echo.   Joint pain    Joint swelling    Rheumatoid arthritis(714.0)    Past Surgical History:  Past Surgical History:  Procedure Laterality Date   COLONOSCOPY N/A 04/03/2013   Procedure: COLONOSCOPY;  Surgeon: Irene Shipper, MD;  Location: [facility];  Service: Endoscopy;  Laterality: N/A;   COLONOSCOPY     ENDOVENOUS ABLATION SAPHENOUS VEIN W/ LASER Left 09/26/2019   endovenous laser ablation left greater saphenous vein by Gae Gallop MD    ENDOVENOUS ABLATION SAPHENOUS VEIN W/ LASER Right 10/31/2019   endovenous laser ablation right greater saphenous vein by Gae Gallop MD    ESOPHAGOGASTRODUODENOSCOPY N/A 04/03/2013   Procedure: ESOPHAGOGASTRODUODENOSCOPY (EGD);  Surgeon: Irene Shipper, MD;  Location: [facility];  Service: Endoscopy;   Laterality: N/A;  changed to moderate dr. Henrene Pastor did not want to go to the o.r. for an endo colon-jmt   EYE SURGERY Left    "when i was a very small boy"   GRAFT APPLICATION Left 23/53/6144   Procedure: GRAFT APPLICATION;  Surgeon: Landis Martins, DPM;  Location: [facility];  Service: Podiatry;  Laterality: Left;  [place] (rep already contacted: Matt Sides)   INCISION AND DRAINAGE OF WOUND Left 10/11/2020   Procedure: IRRIGATION AND DEBRIDEMENT WOUND;  Surgeon: Landis Martins, DPM;  Location: [facility];  Service: Podiatry;  Laterality: Left;   IR CT HEAD LTD  10/14/2020   IR PERCUTANEOUS ART THROMBECTOMY/INFUSION INTRACRANIAL INC DIAG ANGIO  10/14/2020   IR US GUIDE VASC ACCESS RIGHT  10/14/2020   JOINT REPLACEMENT     both knees   KNEE ARTHROPLASTY  06/07/2011   Procedure: COMPUTER ASSISTED TOTAL KNEE ARTHROPLASTY;  Surgeon: Mcarthur Rossetti, MD;  Location: [place];  Service: Orthopedics;  Laterality: Right;  Right total knee arthoplasty   KNEE CLOSED REDUCTION  07/19/2011   Procedure: CLOSED MANIPULATION KNEE;  Surgeon: Mcarthur Rossetti, MD;  Location: [place];  Service: Orthopedics;  Laterality: Right;  Manipulation under anesthesia right knee   LEFT HEART CATH AND CORONARY ANGIOGRAPHY N/A 08/21/2019   Procedure: LEFT HEART CATH AND CORONARY ANGIOGRAPHY;  Surgeon: Belva Crome, MD;  Location: [place] [facility];  Service: Cardiovascular; Mid to distal RCA CTO with faint left-to-right collaterals.  Widely patent left main.  Mid to distal LCx mild luminal irregularities.  Multiple  OM branches.  Large draping LAD with several small diagonal branches.  EF ~40 to 45% with inferior AK.   METATARSAL HEAD EXCISION Left 10/11/2020   Procedure: METATARSAL HEAD EXCISION;  Surgeon: Landis Martins, DPM;  Location: [facility];  Service: Podiatry;  Laterality: Left;  4th metatarsal   RADIOLOGY WITH ANESTHESIA N/A 10/14/2020   Procedure: IR WITH ANESTHESIA;  Surgeon: Radiologist, Medication, MD;  Location: [place];   Service: Radiology;  Laterality: N/A;   RIGHT/LEFT HEART CATH AND CORONARY ANGIOGRAPHY N/A 09/28/2020   Procedure: RIGHT/LEFT HEART CATH AND CORONARY ANGIOGRAPHY;  Surgeon: Nelva Bush, MD;  Location: [place] [facility];  Service: Cardiovascular;; Single-vessel CAD with CTO of mid RCA.  Mild disease involving the LCA (LAD & LCx w/ multiple OM).  Stable since 2021.  Mild to mod elevated LVEDP.  Normal-mildly reduced CO/CI - 5.2, 3.3 (Fick), 4.6, 2.8 by (TD). RA 7 , RV 32/7, PA 29/20, PCWP 22.   TOTAL HIP ARTHROPLASTY Right 11/11/2014   Procedure: RIGHT TOTAL HIP ARTHROPLASTY ANTERIOR APPROACH;  Surgeon: Mcarthur Rossetti, MD;  Location: [place];  Service: Orthopedics;  Laterality: Right;   TOTAL HIP ARTHROPLASTY Left 01/29/2015   Procedure: LEFT TOTAL HIP ARTHROPLASTY ANTERIOR APPROACH;  Surgeon: Mcarthur Rossetti, MD;  Location: [place];  Service: Orthopedics;  Laterality: Left;   TRANSTHORACIC ECHOCARDIOGRAM  08/22/2019   EF 50 to 55%.  GR 1 DD. Mild Inferior Hypokinesis.   TRANSTHORACIC ECHOCARDIOGRAM  09/27/2019   EF 30-35%. Moderately reduced LVEF with Inferior/Inferoseptal Akinesis. Elevated LAP with mild LA dilation - Gr II DD. Mild-Mod AI, no AS.    Assessment / Plan / Recommendation Clinical Impression  Jonathan [name] is a 60 year old right-handed male with history significant for autism spectrum disorder, RA maintained on Arava and prednisone, right knee and hip arthroplasty as well as left total hip arthroplasty 5409, systolic congestive heart failure, chronic venous stasis with chronic left foot ulcer with recent onchomycosis with debridement.  Patient with recent admission 09/26/2020 to 09/29/2020 for exacerbation of CHF.  Per chart review patient lives with cousin for the last 39 years and did family support.  1 level home 5 steps to entry.  Ambulates short distances with a cane needing assist for ADLs.  Presented 10/09/2020 with ischemic changes of left foot maintained on  vancomycin and Zosyn.  Patient underwent left foot incision and drainage with debridement fourth met head resection placement of graft 10/11/2020 per podiatry Dr. Cannon Kettle.  ABIs completed postoperatively showing normal triphasic waveforms in both lower extremities with some elevated pressures suggesting calcification of the vessels.  Vascular surgery Dr. Donnetta Hutching consulted showing no evidence of arterial disease no need for further follow-up at that time.  Patient did complete a 7-day course of IV Ancef for wound care as well as Vibramycin.  On 10/14/2020 patient with acute onset of right side weakness and aphasia.  Cranial CT scan showed abnormal edema and loss of gray-white differentiation in the left basal ganglia concerning for acute or subacute infarct.  No acute hemorrhage.  CT angiogram of the head showed occlusion versus severe stenosis of proximal left M2 MCA with irregular distal opacification.  Patient underwent diagnostic cerebral angiogram with mechanical thrombectomy 10/14/2020 per interventional radiology.  MRI of the brain showed acute infarct left MCA territory affecting lenticulostriate's and inferior division cortex/white matter.  Mild petechial hemorrhage at the level of the left basal ganglia.  Echocardiogram with ejection fraction of 30 to 35%.  The left ventricle showed moderate to severely decreased  function.  The left ventricle demonstrated regional wall motion abnormalities with grade 2 diastolic dysfunction.  Recommendations of 30-day cardiac event monitor as outpatient.  Patient was cleared to begin aspirin and Plavix for CVA prophylaxis x3 weeks then Plavix alone.  Subcutaneous Lovenox for DVT prophylaxis.  Cardiology services continues to follow for CHF and Aldactone has been resumed.  Troponins mildly elevated 25-26-30 felt to be related to demand ischemia.  Patient is tolerating a regular consistency diet.  Therapy evaluations completed due to patient's right-sided weakness and aphasia was  admitted for a comprehensive rehab program.  Pt presents with moderate expressive and receptive aphasia and mild cognitive impairments although baseline is unclear and need to confirm with family (per chart review high level Autism and have aids at home.) Pt is able to express himself at phrase/sentence level with no awareness of phonetic and syntax/content errors, but was aware of some word finding deficits in confrontation tasks (6/10 named common items.) Pt demonstrates 7/10 accuracy in response to yes/no questions pertaining to biographical information/environment and 60% accuracy in response to simple questions. Pt can follow 1 step commands, but only able to follow 2 step commands in 1/3 opportunities, appears impacted by memory deficits increase accuracy with repetition of instructions. Pt can identify objects in a field of 3 in 3/3 opportunities. Pt can repeat at phrase level. Pt was able to demonstrate basic problem solving with call bell and appeared to demonstrate appropriate safety awareness/intellectual awareness.  Pt presents with increase mastication time due to missing dentition, dentures not present in hospital. Oral motor skills appeared [facility] with slight right asymmetry, able to clear orally, swallow initiation was timely with no overt s/s aspiration on all assessed textures (regular textures, dys 3 textures, dys 2 textures, dys 1 textures and thin liquids.) SLP recommends to dys 3 textures and thin liquids requiring assist feeding due to reduced hand grasp, nursing can adjust textures if needed. Pt would benefit from skilled ST services in order to maximize functional independence and reduce burden of care, likely requiring supervision at discharge with continued skilled ST services.   Skilled Therapeutic Interventions          Skilled ST services focused on language skills. SLP facilitated administration subsections of WAB screener and subsections of LARK toolkit assessment and provided  education of results. SLP and pt collaborated to set goals for cognitive linguistic needs during length of stay. All questions answered to satisfaction.  Pt was left in room with call bell within reach and chair alarm set. SLP recommends to continue skilled services.  SLP Assessment  Patient will need skilled Speech Lanaguage Pathology Services during CIR admission    Recommendations  SLP Diet Recommendations: Dysphagia 3 (Mech soft);Thin Liquid Administration via: Cup;Straw Medication Administration: Whole meds with liquid Supervision: Full supervision/cueing for compensatory strategies;Staff to assist with self feeding Postural Changes and/or Swallow Maneuvers: Seated upright 90 degrees Oral Care Recommendations: Oral care BID Patient destination: Home Follow up Recommendations: Home Health SLP;24 hour supervision/assistance Equipment Recommended: None recommended by SLP    SLP Frequency 3 to 5 out of 7 days   SLP Duration  SLP Intensity  SLP Treatment/Interventions 7/8  Minumum of 1-2 x/day, 30 to 90 minutes  Cognitive remediation/compensation;Cueing hierarchy;Functional tasks;Environmental controls;Internal/external aids;Speech/Language facilitation;Patient/family education    Pain Pain Assessment Pain Score: 0-No pain  Prior Functioning Cognitive/Linguistic Baseline: Information not available Type of Home: House  Lives With: Family Available Help at Discharge: Family;Available 24 hours/day  SLP Evaluation Cognition Overall Cognitive Status:  Difficult to assess Arousal/Alertness: Awake/alert Orientation Level: Oriented to place;Oriented to person;Disoriented to time;Oriented to situation Attention: Sustained;Focused Focused Attention: Appears intact Sustained Attention: Impaired Sustained Attention Impairment: Functional complex;Verbal complex Memory: Impaired Memory Impairment: Storage deficit;Retrieval deficit;Decreased recall of new information Awareness:  Impaired Awareness Impairment: Emergent impairment Problem Solving: Impaired Problem Solving Impairment: Functional basic;Verbal basic (impacted by language and recall) Safety/Judgment: Impaired  Comprehension Auditory Comprehension Overall Auditory Comprehension: Impaired Yes/No Questions: Impaired Basic Biographical Questions: 51-75% accurate Basic Immediate Environment Questions: 50-74% accurate Commands: Impaired One Step Basic Commands: 75-100% accurate Two Step Basic Commands: 25-49% accurate Conversation: Simple Interfering Components: Visual impairments Visual Recognition/Discrimination Discrimination: Exceptions to [facility] Common Objects: Able in field of 3 Reading Comprehension Reading Status: Impaired Word level: Within functional limits Sentence Level: Impaired (phrase level tested only) Expression Expression Primary Mode of Expression: Verbal Verbal Expression Overall Verbal Expression: Impaired Initiation: Impaired Level of Generative/Spontaneous Verbalization: Phrase;Sentence Naming: Impairment Confrontation: Impaired (7/10) Common Objects: Able in field of 3 Verbal Errors: Not aware of errors;Perseveration;Phonemic paraphasias;Aware of errors Pragmatics: Impairment Impairments: Dysprosody;Eye contact Written Expression Dominant Hand: Right Written Expression: Not tested Oral Motor Oral Motor/Sensory Function Overall Oral Motor/Sensory Function: Within functional limits Motor Speech Overall Motor Speech: Impaired Respiration: Within functional limits Phonation: Low vocal intensity Resonance: Within functional limits Articulation: Impaired Level of Impairment: Phrase Intelligibility: Intelligibility reduced Word: 75-100% accurate Phrase: 50-74% accurate Motor Planning: Witnin functional limits Motor Speech Errors: Not applicable  Care Tool Care Tool Cognition Expression of Ideas and Wants Expression of Ideas and Wants: Frequent difficulty -  frequently exhibits difficulty with expressing needs and ideas   Understanding Verbal and Non-Verbal Content Understanding Verbal and Non-Verbal Content: Usually understands - understands most conversations, but misses some part/intent of message. Requires cues at times to understand   Memory/Recall Ability *first 3 days only       PMSV Assessment  PMSV Trial Intelligibility: Intelligibility reduced Word: 75-100% accurate Phrase: 50-74% accurate  Bedside Swallowing Assessment General Diet Prior to this Study: Regular;Thin liquids Behavior/Cognition: Alert;Cooperative;Pleasant mood Oral Cavity - Dentition: Edentulous Self-Feeding Abilities: Total assist Patient Positioning: Upright in chair/Tumbleform Baseline Vocal Quality: Normal Volitional Cough: Strong Volitional Swallow: Unable to elicit  Oral Care Assessment Does patient have any of the following "high(er) risk" factors?: None of the above Does patient have any of the following "at risk" factors?: None of the above Patient is AT RISK: Order set for Adult Oral Care Protocol initiated -  "At Risk Patients" option selected (see row information) Patient is LOW RISK: Follow universal precautions (see row information) Ice Chips Ice chips: Not tested Thin Liquid Thin Liquid: Within functional limits Presentation: Cup;Straw Nectar Thick Nectar Thick Liquid: Not tested Honey Thick Honey Thick Liquid: Not tested Puree Puree: Within functional limits Presentation: Spoon Solid Solid: Impaired Presentation: Spoon Oral Phase Functional Implications: Impaired mastication Other Comments: missing dentures BSE Assessment Risk for Aspiration Impact on safety and function: Mild aspiration risk Other Related Risk Factors: Cognitive impairment  Short Term Goals: Week 1: SLP Short Term Goal 1 (Week 1): Pt will respond to basic yes/no questions with 80 % accuracy given mod A visual/verbal cues. SLP Short Term Goal 2 (Week 1): Pt  will name common objects with 80% accuracy given supervision A semantic and sentence completion cues. SLP Short Term Goal 3 (Week 1): Pt will able to express at phrase/simple sentence level with 80% accuracy syntax/content with min A multimodal cues. SLP Short Term Goal 4 (Week 1): Pt will demonstrate self-monitoring of verbal errors  with max A multimodal cues. SLP Short Term Goal 5 (Week 1): Pt will follow 2 step commands with min A verbal/visual cues. SLP Short Term Goal 6 (Week 1): Pt will demonstrate reading comprehension at phrase level matching to objects in a field of 3 with 80% accuracy and min semantic and sentence completion cues.  Refer to Care Plan for Long Term Goals  Recommendations for other services: None   Discharge Criteria: Patient will be discharged from SLP if patient refuses treatment 3 consecutive times without medical reason, if treatment goals not met, if there is a change in medical status, if patient makes no progress towards goals or if patient is discharged from hospital.  The above assessment, treatment plan, treatment alternatives and goals were discussed and mutually agreed upon: by patient  Tyrese Capriotti  [facility] 10/20/2020, 2:15 PM

## 2020-10-20 NOTE — Evaluation (Signed)
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  Widely patent LCA-large LAD was several small diagonal branches and small diffusely diseased LCx with multiple OM branches.   Hyperlipidemia    Ischemic cardiomyopathy 09/02/2019   Initial EF was 40 to 45%, as of June 2022 EF now 30 to 35%.  Known RCA occlusion.  There is inferior and inferoseptal akinesis on echo.   Joint pain    Joint swelling    Rheumatoid arthritis(714.0)    Past Surgical History:  Past Surgical History:  Procedure Laterality Date   COLONOSCOPY N/A 04/03/2013   Procedure: COLONOSCOPY;  Surgeon: Irene Shipper, MD;  Location: [facility];  Service: Endoscopy;  Laterality: N/A;   COLONOSCOPY     ENDOVENOUS ABLATION SAPHENOUS VEIN W/ LASER Left 09/26/2019   endovenous laser ablation left greater saphenous vein by Gae Gallop MD    ENDOVENOUS ABLATION SAPHENOUS VEIN W/ LASER Right 10/31/2019   endovenous laser ablation right greater saphenous vein by Gae Gallop MD    ESOPHAGOGASTRODUODENOSCOPY N/A 04/03/2013   Procedure: ESOPHAGOGASTRODUODENOSCOPY  (EGD);  Surgeon: Irene Shipper, MD;  Location: [facility];  Service: Endoscopy;  Laterality: N/A;  changed to moderate dr. Henrene Pastor did not want to go to the o.r. for an endo colon-jmt   EYE SURGERY Left    "when i was a very small boy"   GRAFT APPLICATION Left 62/56/3893   Procedure: GRAFT APPLICATION;  Surgeon: Landis Martins, DPM;  Location: [facility];  Service: Podiatry;  Laterality: Left;  [place] (rep already contacted: Matt Sides)   INCISION AND DRAINAGE OF WOUND Left 10/11/2020   Procedure: IRRIGATION AND DEBRIDEMENT WOUND;  Surgeon: Landis Martins, DPM;  Location: [facility];  Service: Podiatry;  Laterality: Left;   IR CT HEAD LTD  10/14/2020   IR PERCUTANEOUS ART THROMBECTOMY/INFUSION INTRACRANIAL INC DIAG ANGIO  10/14/2020   IR US GUIDE VASC ACCESS RIGHT  10/14/2020   JOINT REPLACEMENT     both knees   KNEE ARTHROPLASTY  06/07/2011   Procedure: COMPUTER ASSISTED TOTAL KNEE ARTHROPLASTY;  Surgeon: Mcarthur Rossetti, MD;  Location: [place];  Service: Orthopedics;  Laterality: Right;  Right total knee arthoplasty   KNEE CLOSED REDUCTION  07/19/2011   Procedure: CLOSED MANIPULATION KNEE;  Surgeon: Mcarthur Rossetti, MD;  Location: [place];  Service: Orthopedics;  Laterality: Right;  Manipulation under anesthesia right knee   LEFT HEART CATH AND CORONARY ANGIOGRAPHY N/A 08/21/2019   Procedure: LEFT HEART CATH AND CORONARY ANGIOGRAPHY;  Surgeon: Belva Crome, MD;  Location: [facility];  Service: Cardiovascular; Mid to distal RCA CTO with faint left-to-right collaterals.  Widely  patent left main.  Mid to distal LCx mild luminal irregularities.  Multiple OM branches.  Large draping LAD with several small diagonal branches.  EF ~40 to 45% with inferior AK.   METATARSAL HEAD EXCISION Left 10/11/2020   Procedure: METATARSAL HEAD EXCISION;  Surgeon: Landis Martins, DPM;  Location: [facility];  Service: Podiatry;  Laterality: Left;  4th metatarsal   RADIOLOGY WITH ANESTHESIA N/A 10/14/2020    Procedure: IR WITH ANESTHESIA;  Surgeon: Radiologist, Medication, MD;  Location: [place];  Service: Radiology;  Laterality: N/A;   RIGHT/LEFT HEART CATH AND CORONARY ANGIOGRAPHY N/A 09/28/2020   Procedure: RIGHT/LEFT HEART CATH AND CORONARY ANGIOGRAPHY;  Surgeon: Nelva Bush, MD;  Location: [place] [facility];  Service: Cardiovascular;; Single-vessel CAD with CTO of mid RCA.  Mild disease involving the LCA (LAD & LCx w/ multiple OM).  Stable since 2021.  Mild to mod elevated LVEDP.  Normal-mildly reduced CO/CI - 5.2, 3.3 ([place]), 4.6, 2.8 by (TD). RA 7 , RV 32/7, PA 29/20, PCWP 22.   TOTAL HIP ARTHROPLASTY Right 11/11/2014   Procedure: RIGHT TOTAL HIP ARTHROPLASTY ANTERIOR APPROACH;  Surgeon: Mcarthur Rossetti, MD;  Location: [place];  Service: Orthopedics;  Laterality: Right;   TOTAL HIP ARTHROPLASTY Left 01/29/2015   Procedure: LEFT TOTAL HIP ARTHROPLASTY ANTERIOR APPROACH;  Surgeon: Mcarthur Rossetti, MD;  Location: [place];  Service: Orthopedics;  Laterality: Left;   TRANSTHORACIC ECHOCARDIOGRAM  08/22/2019   EF 50 to 55%.  GR 1 DD. Mild Inferior Hypokinesis.   TRANSTHORACIC ECHOCARDIOGRAM  09/27/2019   EF 30-35%. Moderately reduced LVEF with Inferior/Inferoseptal Akinesis. Elevated LAP with mild LA dilation - Gr II DD. Mild-Mod AI, no AS.    Assessment & Plan Clinical Impression: Jonathan [name] is a 60 year old right-handed male with history significant for autism spectrum disorder, RA maintained on Arava and prednisone, right knee and hip arthroplasty as well as left total hip arthroplasty 5300, systolic congestive heart failure, chronic venous stasis with chronic left foot ulcer with recent onchomycosis with debridement.  Patient with recent admission 09/26/2020 to 09/29/2020 for exacerbation of CHF.  Per chart review patient lives with cousin for the last 71 years and did family support.  1 level home 5 steps to entry.  Ambulates short distances with a cane needing assist for ADLs.   Presented 10/09/2020 with ischemic changes of left foot maintained on vancomycin and Zosyn.  Patient underwent left foot incision and drainage with debridement fourth met head resection placement of graft 10/11/2020 per podiatry Dr. Cannon Kettle.  ABIs completed postoperatively showing normal triphasic waveforms in both lower extremities with some elevated pressures suggesting calcification of the vessels.  Vascular surgery Dr. Donnetta Hutching consulted showing no evidence of arterial disease no need for further follow-up at that time.  Patient did complete a 7-day course of IV Ancef for wound care as well as Vibramycin.  On 10/14/2020 patient with acute onset of right side weakness and aphasia.  Cranial CT scan showed abnormal edema and loss of gray-white differentiation in the left basal ganglia concerning for acute or subacute infarct.  No acute hemorrhage.  CT angiogram of the head showed occlusion versus severe stenosis of proximal left M2 MCA with irregular distal opacification.  Patient underwent diagnostic cerebral angiogram with mechanical thrombectomy 10/14/2020 per interventional radiology.  MRI of the brain showed acute infarct left MCA territory affecting lenticulostriate's and inferior division cortex/white matter.  Mild petechial hemorrhage at the level of the left basal ganglia.  Echocardiogram with ejection fraction of 30 to  35%.  The left ventricle showed moderate to severely decreased function.  The left ventricle demonstrated regional wall motion abnormalities with grade 2 diastolic dysfunction.  Recommendations of 30-day cardiac event monitor as outpatient.  Patient was cleared to begin aspirin and Plavix for CVA prophylaxis x3 weeks then Plavix alone.  Subcutaneous Lovenox for DVT prophylaxis.  Cardiology services continues to follow for CHF and Aldactone has been resumed.  Troponins mildly elevated 25-26-30 felt to be related to demand ischemia.  Patient is tolerating a regular consistency diet.  Patient  transferred to CIR on 10/19/2020 .    Patient currently requires total with basic self-care skills secondary to muscle weakness and muscle joint tightness, decreased cardiorespiratoy endurance, decreased coordination, decreased visual acuity, decreased visual perceptual skills, and decreased visual motor skills, decreased motor planning, decreased initiation, decreased attention, decreased awareness, decreased problem solving, decreased safety awareness, decreased memory, and delayed processing, and decreased sitting balance, decreased standing balance, decreased postural control, and decreased balance strategies.  Prior to hospitalization, patient could complete toileting with supervision.  Patient will benefit from skilled intervention to decrease level of assist with basic self-care skills prior to discharge home with care partner.  Anticipate patient will require 24 hour supervision and moderate physical assestance and follow up home health.  OT - End of Session Activity Tolerance: Tolerates 30+ min activity with multiple rests Endurance Deficit: Yes Endurance Deficit Description: History of baseline deconditioning and weakness impacted 2/2 hospitalization and stroke. Benefits from rest breaks during functional mobility tasks. OT Assessment Rehab Potential (ACUTE ONLY): Good OT Patient demonstrates impairments in the following area(s): Balance;Safety;Perception;Cognition;Sensory;Endurance;Vision;Motor;Pain OT Basic ADL's Functional Problem(s): Eating;Grooming;Toileting OT Transfers Functional Problem(s): Toilet;Tub/Shower OT Additional Impairment(s): None OT Plan OT Intensity: Minimum of 1-2 x/day, 45 to 90 minutes OT Frequency: 5 out of 7 days OT Duration/Estimated Length of Stay: 10 days OT Treatment/Interventions: Balance/vestibular training;Discharge planning;Pain management;Self Care/advanced ADL retraining;Therapeutic Activities;UE/LE Coordination activities;Cognitive  remediation/compensation;Disease mangement/prevention;Functional mobility training;Patient/family education;Therapeutic Exercise;Visual/perceptual remediation/compensation;DME/adaptive equipment instruction;Neuromuscular re-education;Psychosocial support;Splinting/orthotics;UE/LE Strength taining/ROM;Wheelchair propulsion/positioning OT Self Feeding Anticipated Outcome(s): mod assist OT Basic Self-Care Anticipated Outcome(s): grooming: mod assist OT Toileting Anticipated Outcome(s): mod assist OT Bathroom Transfers Anticipated Outcome(s): min assist OT Recommendation Patient destination: Home Follow Up Recommendations: 24 hour supervision/assistance;Home health OT Equipment Recommended: To be determined   OT Evaluation Precautions/Restrictions  Precautions Precautions: Fall Required Braces or Orthoses: Other Brace Other Brace: post op shoe Restrictions Weight Bearing Restrictions: No LLE Weight Bearing: Weight bearing as tolerated Other Position/Activity Restrictions: in post-op shoe General Chart Reviewed: Yes Vital Signs Therapy Vitals Temp: 98.2 F (36.8 C) Pulse Rate: 83 Resp: 20 BP: (!) 111/56 Patient Position (if appropriate): Sitting Oxygen Therapy SpO2: 98 % O2 Device: Room Air Pain Pain Assessment Pain Scale: 0-10 Pain Score: 8  Pain Type: Chronic pain Pain Location: Leg Pain Orientation: Right;Left Pain Descriptors / Indicators: Aching Pain Onset: On-going Pain Intervention(s): RN made aware;Repositioned Multiple Pain Sites: No Home Living/Prior Functioning Home Living Living Arrangements: Other relatives Available Help at Discharge: Family, Available 24 hours/day Type of Home: House Home Access: Stairs to enter Technical brewer of Steps: 5 Entrance Stairs-Rails: Right, Left, Can reach both Home Layout: One level Bathroom Shower/Tub: Chiropodist: Standard Bathroom Accessibility: Yes Additional Comments: per cousin, has power  w/c but this does not fit in their current home; has aide 7 days a week for dressing and bathing tasks  Lives With: Family Prior Function Level of Independence: Needs assistance with ADLs, Needs assistance with tranfers, Needs assistance with homemaking,  Needs assistance with gait, Requires assistive device for independence Bath: Total Dressing: Total Grooming: Total Feeding: Total  Able to Take Stairs?: Yes (Reports his cousin assisted) Driving: No Comments: Pts cousin reports he used one crutch to ambulate short distances including to toilet and able to toilet himself PLOF. Vision Baseline Vision/History: Wears glasses Wears Glasses: At all times Patient Visual Report: Diplopia;Blurring of vision Vision Assessment?: Yes Eye Alignment: Impaired (comment) Ocular Range of Motion: Restricted on the right Alignment/Gaze Preference: Gaze left Tracking/Visual Pursuits: Decreased smoothness of horizontal tracking;Decreased smoothness of vertical tracking;Decreased smoothness of eye movement to RIGHT superior field;Decreased smoothness of eye movement to LEFT superior field;Decreased smoothness of eye movement to RIGHT inferior field;Decreased smoothness of eye movement to LEFT inferior field;Unable to hold eye position out of midline Diplopia Assessment: Disappears with one eye closed;Present all the time/all directions Perception  Perception: Impaired Praxis Praxis: Intact Cognition Overall Cognitive Status: Difficult to assess Arousal/Alertness: Awake/alert Orientation Level: Person;Place;Situation Person: Oriented Place: Oriented Situation: Oriented Year: 2022 Month: June Day of Week: Correct Memory: Impaired Memory Impairment: Storage deficit;Retrieval deficit;Decreased recall of new information Immediate Memory Recall: Sock;Blue;Bed Memory Recall Sock: Not able to recall Memory Recall Blue: Not able to recall Memory Recall Bed: Not able to recall Attention:  Sustained;Focused Focused Attention: Appears intact Focused Attention Impairment: Verbal complex;Functional complex Sustained Attention: Impaired Sustained Attention Impairment: Functional complex;Verbal complex Awareness: Impaired Awareness Impairment: Emergent impairment Problem Solving: Impaired Problem Solving Impairment: Functional basic;Verbal basic Safety/Judgment: Impaired Sensation Sensation Light Touch: Impaired by gross assessment Hot/Cold: Appears Intact Proprioception: Appears Intact Stereognosis: Appears Intact Additional Comments: Difficult to assess 2/2 cog deficits Coordination Gross Motor Movements are Fluid and Coordinated: No Fine Motor Movements are Fluid and Coordinated: No Finger Nose Finger Test: unable to test due to shoulder and elbow contractures on BUE Motor  Motor Motor: Abnormal postural alignment and control Motor - Skilled Clinical Observations: Generalized deconditioning and global weakness  Trunk/Postural Assessment  Cervical Assessment Cervical Assessment: Exceptions to [facility] (forward head) Thoracic Assessment Thoracic Assessment: Exceptions to [facility] (kyphotic) Lumbar Assessment Lumbar Assessment: Exceptions to [facility] (posterior pelvic tilt) Postural Control Postural Control: Deficits on evaluation  Balance Balance Balance Assessed: Yes Static Sitting Balance Static Sitting - Balance Support: Feet supported;No upper extremity supported Static Sitting - Level of Assistance: 5: Stand by assistance Dynamic Sitting Balance Dynamic Sitting - Balance Support: Feet unsupported;No upper extremity supported Dynamic Sitting - Level of Assistance: 4: Min assist Extremity/Trunk Assessment RUE Assessment Passive Range of Motion (PROM) Comments: Multiple joint contractures including bilateral shoulders, elbows, forearm, wrist.  Right hand significantly ulnarly deviated index-small (likely secondary to diagnosis of RA). Active Range of Motion (AROM)  Comments: shoulder scaption 60 degrees, elbow ext/flex: 30/110; unable to achieve supination past 0 degrees. LUE Assessment Active Range of Motion (AROM) Comments: Left shoulder scaption 40 degrees, elbow ext/flex: 30/75; unable to achieve supination past 0 degrees  Care Tool Care Tool Self Care Eating   Eating Assist Level: Total Assistance - Patient < 25%    Oral Care    Oral Care Assist Level: Total assistance - Patient < 25%    Bathing         Assist Level: Total Assistance - Patient < 25%    Upper Body Dressing(including orthotics)       Assist Level: Total Assistance - Patient < 25%    Lower Body Dressing (excluding footwear)     Assist for lower body dressing: Total Assistance - Patient < 25%    Putting  on/Taking off footwear     Assist for footwear: Dependent - Patient 0%       Care Tool Toileting Toileting activity   Assist for toileting: Dependent - Patient 0%     Care Tool Bed Mobility Roll left and right activity        Sit to lying activity        Lying to sitting edge of bed activity         Care Tool Transfers Sit to stand transfer        Chair/bed transfer         Toilet transfer   Assist Level: Total Assistance - Patient < 25%     Care Tool Cognition Expression of Ideas and Wants Expression of Ideas and Wants: Frequent difficulty - frequently exhibits difficulty with expressing needs and ideas   Understanding Verbal and Non-Verbal Content Understanding Verbal and Non-Verbal Content: Usually understands - understands most conversations, but misses some part/intent of message. Requires cues at times to understand   Memory/Recall Ability *first 3 days only Memory/Recall Ability *first 3 days only: Staff names and faces;That he or she is in a hospital/hospital unit;Current season    Refer to Care Plan for Long Term Goals  SHORT TERM GOAL WEEK 1 OT Short Term Goal 1 (Week 1): Pt will self feed using AE as needed with max assist OT  Short Term Goal 2 (Week 1): Pt will complete toilet transfer with mod assist OT Short Term Goal 3 (Week 1): Pt will complete toileting with max assist  Recommendations for other services: None    Skilled Therapeutic Intervention ADL ADL Eating: Dependent Where Assessed-Eating: Wheelchair Upper Body Dressing: Dependent Where Assessed-Upper Body Dressing: Wheelchair Lower Body Dressing: Dependent Where Assessed-Lower Body Dressing: Wheelchair Mobility  Transfers Sit to Stand: Total Assistance - Patient < 25% Stand to Sit: Total Assistance - Patient < 25%  Skilled Intervention:  Pt sitting up in w/c upon OT arrival.  Evaluation completed and collaborated with pt and cousing (via phone with pt present) regarding OT POC. Self care and functional mobility completed per above levels of assist.  Nurse notified of pts pain level.  Educated nursing regarding pts toileting and functional transfer status as well as need for total assist with self feeding at this time.  Pt sitting up in w/c, call bell and needs in reach, seat alarm on.   Discharge Criteria: Patient will be discharged from OT if patient refuses treatment 3 consecutive times without medical reason, if treatment goals not met, if there is a change in medical status, if patient makes no progress towards goals or if patient is discharged from hospital.  The above assessment, treatment plan, treatment alternatives and goals were discussed and mutually agreed upon: by patient and by family  Jonathan [name] 10/20/2020, 3:49 PM

## 2020-10-20 NOTE — Progress Notes (Signed)
Occupational Therapy Session Note  Patient Details  Name: Jonathan [name] MRN: [id number] Date of Birth: [birth year]  Today's Date: 10/20/2020 OT Individual Time: 1400-1430 OT Individual Time Calculation (min): 30 min    Short Term Goals: See eval.   Skilled Therapeutic Interventions/Progress Updates:    Pt received in room in w/c and consented to OT tx. Pt was getting BP taken and was low, 93/51. Pt had no c/o lightheadedness or dizziness, will monitor throughout session.  Pt seen for UB strengthening and [facility] this session. Instructed pt in 1#db bicep curls and shoulder flexion, however pt unable to move through entire elbow ROM 2/2 contractures.  Pt then trained in [facility] activity with 9 hole peg test, however not timed as pegs were utilized for intervention purposes to increase overall UE strength and [facility] for ADLs. Pt required increased time for peg activity, plan to time 9 HPT in following sessions. After tx, pt left up in w/c with all needs met, BP stable.  Therapy Documentation Precautions:  Precautions Precautions: Fall Required Braces or Orthoses: Other Brace Other Brace: post op shoe Restrictions Weight Bearing Restrictions: No LLE Weight Bearing: Weight bearing as tolerated Other Position/Activity Restrictions: in post-op shoe General:   Vital Signs: Therapy Vitals Temp: 98.2 F (36.8 C) Pulse Rate: 79 Resp: 20 BP: (!) 93/51 Patient Position (if appropriate): Sitting Oxygen Therapy SpO2: 98 % O2 Device: Room Air Pain: Pain Assessment Pain Score: 0-No pain   Therapy/Group: Individual Therapy  Breelynn Bankert 10/20/2020, 2:19 PM

## 2020-10-20 NOTE — Evaluation (Signed)
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  Widely patent LCA-large LAD was several small diagonal branches and small diffusely diseased LCx with multiple OM branches.   Hyperlipidemia    Ischemic cardiomyopathy 09/02/2019   Initial EF was 40 to 45%, as of June 2022 EF now 30 to 35%.  Known RCA occlusion.  There is inferior and inferoseptal akinesis on echo.   Joint pain    Joint swelling    Rheumatoid arthritis(714.0)    Past Surgical History:  Past Surgical History:  Procedure Laterality Date   COLONOSCOPY N/A 04/03/2013   Procedure: COLONOSCOPY;  Surgeon: Irene Shipper, MD;  Location: [facility];  Service: Endoscopy;  Laterality: N/A;   COLONOSCOPY     ENDOVENOUS ABLATION SAPHENOUS VEIN W/ LASER Left 09/26/2019   endovenous laser ablation left greater saphenous vein by Gae Gallop MD    ENDOVENOUS ABLATION SAPHENOUS VEIN W/ LASER Right 10/31/2019   endovenous laser ablation right greater saphenous vein by Gae Gallop MD    ESOPHAGOGASTRODUODENOSCOPY N/A 04/03/2013   Procedure: ESOPHAGOGASTRODUODENOSCOPY (EGD);   Surgeon: Irene Shipper, MD;  Location: [facility];  Service: Endoscopy;  Laterality: N/A;  changed to moderate dr. Henrene Pastor did not want to go to the o.r. for an endo colon-jmt   EYE SURGERY Left    "when i was a very small boy"   GRAFT APPLICATION Left 79/15/0569   Procedure: GRAFT APPLICATION;  Surgeon: Landis Martins, DPM;  Location: [facility];  Service: Podiatry;  Laterality: Left;  [place] (rep already contacted: Matt Sides)   INCISION AND DRAINAGE OF WOUND Left 10/11/2020   Procedure: IRRIGATION AND DEBRIDEMENT WOUND;  Surgeon: Landis Martins, DPM;  Location: [facility];  Service: Podiatry;  Laterality: Left;   IR CT HEAD LTD  10/14/2020   IR PERCUTANEOUS ART THROMBECTOMY/INFUSION INTRACRANIAL INC DIAG ANGIO  10/14/2020   IR US GUIDE VASC ACCESS RIGHT  10/14/2020   JOINT REPLACEMENT     both knees   KNEE ARTHROPLASTY  06/07/2011   Procedure: COMPUTER ASSISTED TOTAL KNEE ARTHROPLASTY;  Surgeon: Mcarthur Rossetti, MD;  Location: [place];  Service: Orthopedics;  Laterality: Right;  Right total knee arthoplasty   KNEE CLOSED REDUCTION  07/19/2011   Procedure: CLOSED MANIPULATION KNEE;  Surgeon: Mcarthur Rossetti, MD;  Location: [place];  Service: Orthopedics;  Laterality: Right;  Manipulation under anesthesia right knee   LEFT HEART CATH AND CORONARY ANGIOGRAPHY N/A 08/21/2019   Procedure: LEFT HEART CATH AND CORONARY ANGIOGRAPHY;  Surgeon: Belva Crome, MD;  Location: [place] [facility];  Service: Cardiovascular; Mid to distal RCA CTO with faint left-to-right collaterals.  Widely patent left  main.  Mid to distal LCx mild luminal irregularities.  Multiple OM branches.  Large draping LAD with several small diagonal branches.  EF ~40 to 45% with inferior AK.   METATARSAL HEAD EXCISION Left 10/11/2020   Procedure: METATARSAL HEAD EXCISION;  Surgeon: Landis Martins, DPM;  Location: [facility];  Service: Podiatry;  Laterality: Left;  4th metatarsal   RADIOLOGY WITH ANESTHESIA N/A 10/14/2020   Procedure: IR  WITH ANESTHESIA;  Surgeon: Radiologist, Medication, MD;  Location: [place];  Service: Radiology;  Laterality: N/A;   RIGHT/LEFT HEART CATH AND CORONARY ANGIOGRAPHY N/A 09/28/2020   Procedure: RIGHT/LEFT HEART CATH AND CORONARY ANGIOGRAPHY;  Surgeon: Nelva Bush, MD;  Location: [place] [facility];  Service: Cardiovascular;; Single-vessel CAD with CTO of mid RCA.  Mild disease involving the LCA (LAD & LCx w/ multiple OM).  Stable since 2021.  Mild to mod elevated LVEDP.  Normal-mildly reduced CO/CI - 5.2, 3.3 ([place]), 4.6, 2.8 by (TD). RA 7 , RV 32/7, PA 29/20, PCWP 22.   TOTAL HIP ARTHROPLASTY Right 11/11/2014   Procedure: RIGHT TOTAL HIP ARTHROPLASTY ANTERIOR APPROACH;  Surgeon: Mcarthur Rossetti, MD;  Location: [place];  Service: Orthopedics;  Laterality: Right;   TOTAL HIP ARTHROPLASTY Left 01/29/2015   Procedure: LEFT TOTAL HIP ARTHROPLASTY ANTERIOR APPROACH;  Surgeon: Mcarthur Rossetti, MD;  Location: [place];  Service: Orthopedics;  Laterality: Left;   TRANSTHORACIC ECHOCARDIOGRAM  08/22/2019   EF 50 to 55%.  GR 1 DD. Mild Inferior Hypokinesis.   TRANSTHORACIC ECHOCARDIOGRAM  09/27/2019   EF 30-35%. Moderately reduced LVEF with Inferior/Inferoseptal Akinesis. Elevated LAP with mild LA dilation - Gr II DD. Mild-Mod AI, no AS.    Assessment & Plan Clinical Impression: Patient is a 60 year old right-handed male with history significant for autism spectrum disorder, RA maintained on Arava and prednisone, right knee and hip arthroplasty as well as left total hip arthroplasty 9629, systolic congestive heart failure, chronic venous stasis with chronic left foot ulcer with recent onchomycosis with debridement.  Patient with recent admission 09/26/2020 to 09/29/2020 for exacerbation of CHF.  Per chart review patient lives with cousin for the last 25 years and did family support.  1 level home 5 steps to entry.  Ambulates short distances with a cane needing assist for ADLs.  Presented 10/09/2020 with  ischemic changes of left foot maintained on vancomycin and Zosyn.  Patient underwent left foot incision and drainage with debridement fourth met head resection placement of graft 10/11/2020 per podiatry Dr. Cannon Kettle.  ABIs completed postoperatively showing normal triphasic waveforms in both lower extremities with some elevated pressures suggesting calcification of the vessels.  Vascular surgery Dr. Donnetta Hutching consulted showing no evidence of arterial disease no need for further follow-up at that time.  Patient did complete a 7-day course of IV Ancef for wound care as well as Vibramycin.  On 10/14/2020 patient with acute onset of right side weakness and aphasia.  Cranial CT scan showed abnormal edema and loss of gray-white differentiation in the left basal ganglia concerning for acute or subacute infarct.  No acute hemorrhage.  CT angiogram of the head showed occlusion versus severe stenosis of proximal left M2 MCA with irregular distal opacification.  Patient underwent diagnostic cerebral angiogram with mechanical thrombectomy 10/14/2020 per interventional radiology.  MRI of the brain showed acute infarct left MCA territory affecting lenticulostriate's and inferior division cortex/white matter.  Mild petechial hemorrhage at the level of the left basal ganglia.  Echocardiogram with ejection fraction of 30 to 35%.  The  left ventricle showed moderate to severely decreased function.  The left ventricle demonstrated regional wall motion abnormalities with grade 2 diastolic dysfunction.  Recommendations of 30-day cardiac event monitor as outpatient.  Patient was cleared to begin aspirin and Plavix for CVA prophylaxis x3 weeks then Plavix alone.  Subcutaneous Lovenox for DVT prophylaxis.  Cardiology services continues to follow for CHF and Aldactone has been resumed.  Troponins mildly elevated 25-26-30 felt to be related to demand ischemia.  Patient is tolerating a regular consistency diet.  Therapy evaluations completed due to  patient's right-sided weakness and aphasia was admitted for a comprehensive rehab program.Patient transferred to CIR on 10/19/2020 .   Patient currently requires max with mobility secondary to muscle weakness and muscle joint tightness, decreased cardiorespiratoy endurance, unbalanced muscle activation, decreased coordination, and decreased motor planning, decreased visual acuity, decreased visual perceptual skills, and decreased visual motor skills, decreased motor planning, decreased initiation, decreased attention, decreased awareness, decreased problem solving, decreased safety awareness, decreased memory, and delayed processing, and decreased sitting balance, decreased standing balance, decreased postural control, and decreased balance strategies.  Prior to hospitalization, patient was min with mobility and lived with Family ([place]) in a House home.  Home access is 5Stairs to enter.  Patient will benefit from skilled PT intervention to maximize safe functional mobility, minimize fall risk, and decrease caregiver burden for planned discharge home with 24 hour assist.  Anticipate patient will benefit from follow up [facility] at discharge.  PT - End of Session Activity Tolerance: Tolerates 30+ min activity with multiple rests Endurance Deficit: Yes Endurance Deficit Description: History of baseline deconditioning and weakness impacted 2/2 hospitalization and stroke. Benefits from rest breaks during functional mobility tasks. PT Assessment Rehab Potential (ACUTE/IP ONLY): Fair PT Barriers to Discharge: Inaccessible home environment;Decreased caregiver support;Home environment access/layout;Wound Care;Lack of/limited family support;Insurance for SNF coverage;Weight;Weight bearing restrictions;Medication compliance;Nutrition means PT Patient demonstrates impairments in the following area(s): Balance;Endurance;Motor;Nutrition;Pain;Safety;Sensory;Skin Integrity PT Transfers Functional Problem(s): Bed  Mobility;Bed to Chair;Car PT Locomotion Functional Problem(s): Ambulation;Stairs PT Plan PT Intensity: Minimum of 1-2 x/day ,45 to 90 minutes PT Frequency: 5 out of 7 days PT Duration Estimated Length of Stay: 2 weeks PT Treatment/Interventions: Ambulation/gait training;Discharge planning;Functional mobility training;Psychosocial support;Therapeutic Activities;Visual/perceptual remediation/compensation;Wheelchair propulsion/positioning;Therapeutic Exercise;Skin care/wound management;Neuromuscular re-education;Disease management/prevention;Balance/vestibular training;Cognitive remediation/compensation;DME/adaptive equipment instruction;Pain management;Splinting/orthotics;UE/LE Strength taining/ROM;UE/LE Coordination activities;Stair training;Patient/family education;Community reintegration PT Transfers Anticipated Outcome(s): minA with LRAD PT Locomotion Anticipated Outcome(s): minA with LRAD PT Recommendation Follow Up Recommendations: Home health PT;24 hour supervision/assistance Patient destination: Home Equipment Recommended: To be determined   PT Evaluation Precautions/Restrictions Precautions Precautions: Fall Required Braces or Orthoses: Other Brace Other Brace: post op shoe Restrictions Weight Bearing Restrictions: No LLE Weight Bearing: Weight bearing as tolerated Other Position/Activity Restrictions: in post-op shoe General Chart Reviewed: Yes Family/Caregiver Present: No Vital SignsTherapy Vitals Pulse Rate: 79 BP: (!) 109/54 Pain Pain Assessment Pain Scale: Faces Faces Pain Scale: Hurts even more Pain Location: Arm Pain Orientation: Right;Left Pain Intervention(s): Medication (See eMAR) Home Living/Prior Functioning Home Living Available Help at Discharge: Family;Available 24 hours/day Type of Home: House Home Access: Stairs to enter [employer] of Steps: 5 Entrance Stairs-Rails: Right;Left;Can reach both Home Layout: One level Additional Comments:  Pulled throug from Acute Care (pt is poor historian and unreliable responses) : has a lift chair that heps him stand. has a hospital bed that he elevates as well for standing. He lives wtih his cousin and had aids 7 days/week  Lives With: Family ([place]) Prior Function Level of Independence: Needs assistance with ADLs;Needs  assistance with tranfers;Needs assistance with homemaking;Needs assistance with gait;Requires assistive device for independence  Able to Take Stairs?: Yes (Reports his cousin assisted) Driving: No Comments: Pt reports he ambulates wtih axillary crutches but will sometimes use his power w/c. Pt is poor historian and will need to confirm PLOF and social factors with family Vision/Perception  Perception Perception: Impaired Praxis Praxis: Intact  Cognition Overall Cognitive Status: Difficult to assess Arousal/Alertness: Awake/alert Orientation Level: Oriented to person;Disoriented to place;Disoriented to time;Disoriented to situation Attention: Focused;Sustained Focused Attention: Impaired Focused Attention Impairment: Verbal complex;Functional complex Sustained Attention: Impaired Sustained Attention Impairment: Functional complex;Verbal complex Awareness: Impaired Problem Solving: Impaired Problem Solving Impairment: Functional basic Safety/Judgment: Impaired Sensation Sensation Light Touch: Impaired by gross assessment Hot/Cold: Appears Intact Proprioception: Appears Intact Stereognosis: Appears Intact Additional Comments: Difficult to assess 2/2 cog deficits Coordination Gross Motor Movements are Fluid and Coordinated: No Fine Motor Movements are Fluid and Coordinated: No Coordination and Movement Description: Generalized weakness and deconditioning Motor  Motor Motor: Abnormal postural alignment and control Motor - Skilled Clinical Observations: Generalized deconditioning and global weakness   Trunk/Postural Assessment  Cervical Assessment Cervical  Assessment: Exceptions to [facility] (forward head) Thoracic Assessment Thoracic Assessment: Exceptions to [facility] (Rounded shoulders and mild thoracic kyphosis) Lumbar Assessment Lumbar Assessment: Exceptions to [facility] (posterior pelvic tilt) Postural Control Postural Control: Deficits on evaluation  Balance Balance Balance Assessed: Yes Static Sitting Balance Static Sitting - Balance Support: Feet supported;No upper extremity supported Static Sitting - Level of Assistance: 5: Stand by assistance Dynamic Sitting Balance Dynamic Sitting - Balance Support: Feet unsupported;No upper extremity supported Dynamic Sitting - Level of Assistance: 4: Min Insurance risk surveyor Standing - Balance Support: No upper extremity supported Static Standing - Level of Assistance: 2: Max assist Dynamic Standing Balance Dynamic Standing - Balance Support: During functional activity;No upper extremity supported Dynamic Standing - Level of Assistance: 2: Max assist Extremity Assessment      RLE Assessment RLE Assessment: Exceptions to [facility] Passive Range of Motion (PROM) Comments: Contracted R ankle to neutral ta 90deg. Knee flexion limited 90 deg General Strength Comments: Ankle strength limited 2/2 contracture. Knee ext 3+/5, hip flex 2+/5 (Difficult to formally assesss due to cognitive deficits) LLE Assessment LLE Assessment: Exceptions to [facility] General Strength Comments: Deferred testing L ankle 2/2 surgical history. Knee ext 4-/5, hip flex 2+/5 (Limited MMT 2/2 cog deficits)  Care Tool Care Tool Bed Mobility Roll left and right activity   Roll left and right assist level: Supervision/Verbal cueing    Sit to lying activity   Sit to lying assist level: Moderate Assistance - Patient 50 - 74%    Lying to sitting edge of bed activity   Lying to sitting edge of bed assist level: Moderate Assistance - Patient 50 - 74%     Care Tool Transfers Sit to stand transfer   Sit to stand assist level:  Maximal Assistance - Patient 25 - 49%    Chair/bed transfer   Chair/bed transfer assist level: Maximal Assistance - Patient 25 - 49%     Psychologist, counselling transfer activity did not occur: Safety/medical concerns        Care Tool Locomotion Ambulation Ambulation activity did not occur: Safety/medical concerns        Walk 10 feet activity Walk 10 feet activity did not occur: Safety/medical concerns       Walk 50 feet with 2 turns activity Walk 50 feet with 2  turns activity did not occur: Safety/medical concerns      Walk 150 feet activity Walk 150 feet activity did not occur: Safety/medical concerns      Walk 10 feet on uneven surfaces activity Walk 10 feet on uneven surfaces activity did not occur: Safety/medical concerns      Stairs Stair activity did not occur: Safety/medical concerns        Walk up/down 1 step activity Walk up/down 1 step or curb (drop down) activity did not occur: Safety/medical concerns     Walk up/down 4 steps activity did not occuR: Safety/medical concerns  Walk up/down 4 steps activity      Walk up/down 12 steps activity Walk up/down 12 steps activity did not occur: Safety/medical concerns      Pick up small objects from floor Pick up small object from the floor (from standing position) activity did not occur: Safety/medical concerns      Wheelchair Will patient use wheelchair at discharge?: No   Wheelchair activity did not occur: N/A      Wheel 50 feet with 2 turns activity Wheelchair 50 feet with 2 turns activity did not occur: N/A    Wheel 150 feet activity Wheelchair 150 feet activity did not occur: N/A      Refer to Care Plan for Long Term Goals  SHORT TERM GOAL WEEK 1 PT Short Term Goal 1 (Week 1): Pt will complete bed mobility with minA PT Short Term Goal 2 (Week 1): Pt will complete sit<>stand transfer with modA and LRAD PT Short Term Goal 3 (Week 1): Pt will complete bed<>chair transfer with modA and  LRAD PT Short Term Goal 4 (Week 1): Pt will ambulate 95f with modA and LRAD  Recommendations for other services: None   Skilled Therapeutic Intervention Mobility Bed Mobility Bed Mobility: Rolling Right;Rolling Left;Supine to Sit;Sit to Supine Rolling Right: Supervision/verbal cueing Rolling Left: Supervision/Verbal cueing Supine to Sit: Moderate Assistance - Patient 50-74% Sit to Supine: Moderate Assistance - Patient 50-74% Transfers Transfers: Sit to Stand;Stand to Sit;Squat Pivot Transfers Sit to Stand: Maximal Assistance - Patient 25-49% Stand to Sit: Maximal Assistance - Patient 25-49% Stand Pivot Transfer Details: Tactile cues for initiation;Tactile cues for sequencing;Tactile cues for weight shifting;Tactile cues for posture;Visual cues/gestures for precautions/safety;Visual cues for safe use of DME/AE;Visual cues/gestures for sequencing;Verbal cues for sequencing;Verbal cues for technique;Verbal cues for precautions/safety;Verbal cues for gait pattern;Verbal cues for safe use of DME/AE;Tactile cues for placement;Tactile cues for weight beaing Transfer (Assistive device): None Locomotion  Gait Ambulation: No Gait Gait: No Stairs / Additional Locomotion Stairs: No Wheelchair Mobility Wheelchair Mobility: No  Skilled Intervention: Pt received supine in bed to start and agreeable to PT evaluation. Pt pleasant and cooperative throughout session with delayed initiation and processing noted. He's oriented to self only. Initiated functional mobility as outlined above. Retrieved 16x16 w/c from DME closet with Roho cushion for improved skin integrity and w/c positioning. He was able to roll in bed with supervision, required modA for supine<>sit without bed features (pt uses hospital bed at home). He required maxA for sit<>stand transfers and maxA for squat<>pivot transfers from EOB to w/c. Attempted to use Stedy but due to BUE weakness, poor grip, and shoulder contractures, unable to  reach stedy (anticipate from EOB he would be able to if allowed Stedy to be closer). Pt ended session seated in w/c with safety belt alarm on with all needs met.   Instructed pt in results of PT evaluation as detailed above, PT POC, rehab  potential, rehab goals, and discharge recommendations. Additionally discussed CIR's policies regarding fall safety and use of chair alarm and/or quick release belt. Pt verbalized understanding and in agreement. Will update pt's family members as they become available.   Discharge Criteria: Patient will be discharged from PT if patient refuses treatment 3 consecutive times without medical reason, if treatment goals not met, if there is a change in medical status, if patient makes no progress towards goals or if patient is discharged from hospital.  The above assessment, treatment plan, treatment alternatives and goals were discussed and mutually agreed upon: by patient  Alger Simons PT, DPT 10/20/2020, 9:57 AM

## 2020-10-20 NOTE — Progress Notes (Signed)
Inpatient Rehabilitation  Patient information reviewed and entered into eRehab system by Turner Kunzman Mykel Mohl, OTR/L.   Information including medical coding, functional ability and quality indicators will be reviewed and updated through discharge.    

## 2020-10-20 NOTE — Progress Notes (Signed)
PROGRESS NOTE   Subjective/Complaints: Hypotensive this morning. Flowsheet reviewed and has been consistently low. Decrease Lisinopril to 2.78m.  Has no complaints Albumin low- will start Juven supplement  ROS: +diffuse muscle aches  Objective:   No results found. Recent Labs    10/18/20 0020 10/20/20 0434  WBC 6.1 6.2  HGB 9.4* 9.3*  HCT 30.0* 29.9*  PLT 310 308   Recent Labs    10/18/20 0020 10/20/20 0434  NA 136 136  K 4.1 4.0  CL 107 105  CO2 22 24  GLUCOSE 115* 108*  BUN 6 8  CREATININE 0.55* 0.48*  CALCIUM 9.1 9.5    Intake/Output Summary (Last 24 hours) at 10/20/2020 1030 Last data filed at 10/19/2020 1802 Gross per 24 hour  Intake 336 ml  Output --  Net 336 ml        Physical Exam: Vital Signs Blood pressure (!) 109/54, pulse 79, temperature 98 F (36.7 C), temperature source Oral, resp. rate 16, height 5' 3"  (1.6 m), weight 64.1 kg, SpO2 98 %. Gen: no distress, normal appearing HEENT: oral mucosa pink and moist, NCAT Cardio: Reg rate Chest: normal effort, normal rate of breathing Abd: soft, non-distended Ext: no edema Psych: pleasant, normal affect Skin:    General: Skin is warm.    Comments: Heavy bulky dressing in place to left foot, staples present, mild serosanguinous dressing. Skin in general very dry.   Neurological:    Mental Status: He is alert.    Comments: Patient is awake and alert.  Makes eye contact with examiner. Dysconjugate gaze. Nystagmus with left lateral gaze, right eye wanders with left gaze as well. Pt wearing tape over left lens of glasses. Mild left lid ptosis.   Left facial weakness. Provides his name and age with some delay in processing and word finding. Limited insight and awareness. He needed multiple cues for place.  Follows simple commands. RUE 4-/5. LUE 4+/5. RLE 3-4/5. LLE 3+/5 prox to distal, limited by wound on foot. Sensed pain in all 4's. Musculoskeletal:         General: No swelling or tenderness.    Cervical back: Normal range of motion.    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 Functional deficits which require 3+ hours per day of interdisciplinary therapy in a comprehensive inpatient rehab setting. Physiatrist is providing close team supervision and 24 hour management of active medical problems listed below. Physiatrist and rehab team continue to assess barriers to discharge/monitor patient progress toward functional and medical goals  Care Tool:  Bathing              Bathing assist       Upper Body Dressing/Undressing Upper body dressing   What is the patient wearing?: Hospital gown only    Upper body assist Assist Level: Total Assistance - Patient < 25%    Lower Body Dressing/Undressing Lower body dressing      What is the patient wearing?: Incontinence brief     Lower body assist       Toileting Toileting    Toileting assist Assist for toileting: Total Assistance - Patient < 25% (primofit)  Transfers Chair/bed transfer  Transfers assist     Chair/bed transfer assist level: Maximal Assistance - Patient 25 - 49%     Locomotion Ambulation   Ambulation assist   Ambulation activity did not occur: Safety/medical concerns          Walk 10 feet activity   Assist  Walk 10 feet activity did not occur: Safety/medical concerns        Walk 50 feet activity   Assist Walk 50 feet with 2 turns activity did not occur: Safety/medical concerns         Walk 150 feet activity   Assist Walk 150 feet activity did not occur: Safety/medical concerns         Walk 10 feet on uneven surface  activity   Assist Walk 10 feet on uneven surfaces activity did not occur: Safety/medical concerns         Wheelchair     Assist Will patient use wheelchair at discharge?: No   Wheelchair activity did not occur: N/A         Wheelchair 50 feet  with 2 turns activity    Assist    Wheelchair 50 feet with 2 turns activity did not occur: N/A       Wheelchair 150 feet activity     Assist  Wheelchair 150 feet activity did not occur: N/A       Blood pressure (!) 109/54, pulse 79, temperature 98 F (36.7 C), temperature source Oral, resp. rate 16, height 5' 3"  (1.6 m), weight 64.1 kg, SpO2 98 %.    Medical Problem List and Plan: 1.  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  Impaired mobility, ambulating 40 feet -DVT/anticoagulation: Continue Lovenox             -antiplatelet therapy: Aspirin 81 mg daily and Plavix 75 mg day x3 weeks then Plavix alone.  Recommendations of 30-day cardiac event monitor 3. Diffuse muscle aches: Neurontin 300 mg twice daily. Add kpad 4. Mood: Provide emotional support             -antipsychotic agents: N/A 5. Neuropsych: This patient is not capable of making decisions on his own behalf. 7. Fluids/Electrolytes/Nutrition: Routine in and outs with follow-up chemistries on admit 8.  Chronic left foot wound.  Status post left foot incision and drainage debridement of fourth met head resection 10/11/2020.   -Follow-up per Dr. Cannon [facility] podiatry services as outpt -IV Ancef and Vibramycin course completed -continue daily dressing to foot -ordered Darco boot 9.  Systolic congestive heart failure.  Follow-up cardiology service.  Aldactone resumed 25 mg daily.  Monitor for any signs of fluid overload             -daily weights 10.  Hypertension/nonsustained VT.  Lisinopril 5 mg daily, Imdur 60 mg daily, Toprol-XL 25 mg daily.  Monitor with increased mobility  6/28: hypotensive:  decrease lisinopril to 2.8m 11.  RA.  Chronic prednisone.  We will discuss resuming Arava 12.  High-level autism.  We will discuss baseline with family 153 Hypoalbuminemia: add Juven   LOS:  1 days A FACE TO FACE EVALUATION WAS PERFORMED  Laporscha Linehan P Kerra Guilfoil 10/20/2020, 10:30 AM

## 2020-10-20 NOTE — Progress Notes (Signed)
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) 08/2019   Admitted 6 4-7/20/2022 with CHF   Coronary artery disease, occlusive 08/01/2019   RCA CTO with left-to-right collaterals.  Widely patent LCA-large LAD was several small diagonal branches and small diffusely diseased LCx with multiple OM branches.   Hyperlipidemia    Ischemic cardiomyopathy 09/02/2019   Initial EF was 40 to 45%, as of June 2022 EF now 30 to 35%.  Known RCA occlusion.  There is inferior and inferoseptal akinesis on echo.   Joint pain    Joint swelling    Rheumatoid arthritis(714.0)    Past Surgical History:  Past Surgical History:  Procedure Laterality Date   COLONOSCOPY N/A 04/03/2013   Procedure: COLONOSCOPY;  Surgeon: Irene Shipper, MD;  Location: [facility];  Service: Endoscopy;  Laterality: N/A;   COLONOSCOPY     ENDOVENOUS ABLATION SAPHENOUS VEIN W/ LASER Left 09/26/2019   endovenous laser ablation left greater saphenous vein by Gae Gallop MD    ENDOVENOUS ABLATION SAPHENOUS VEIN W/ LASER Right 10/31/2019   endovenous laser ablation right greater saphenous vein by Gae Gallop MD    ESOPHAGOGASTRODUODENOSCOPY N/A 04/03/2013   Procedure: ESOPHAGOGASTRODUODENOSCOPY (EGD);  Surgeon: Irene Shipper, MD;  Location: [facility];  Service: Endoscopy;  Laterality: N/A;  changed to moderate dr. Henrene Pastor did not want to go to the o.r. for an endo colon-jmt   EYE SURGERY Left    "when i was a very small boy"   GRAFT APPLICATION  Left 29/47/6546   Procedure: GRAFT APPLICATION;  Surgeon: Landis Martins, DPM;  Location: [facility];  Service: Podiatry;  Laterality: Left;  [place] (rep already contacted: Matt Sides)   INCISION AND DRAINAGE OF WOUND Left 10/11/2020   Procedure: IRRIGATION AND DEBRIDEMENT WOUND;  Surgeon: Landis Martins, DPM;  Location: [facility];  Service: Podiatry;  Laterality: Left;   IR CT HEAD LTD  10/14/2020   IR PERCUTANEOUS ART THROMBECTOMY/INFUSION INTRACRANIAL INC DIAG ANGIO  10/14/2020   IR US GUIDE VASC ACCESS RIGHT  10/14/2020   JOINT REPLACEMENT     both knees   KNEE ARTHROPLASTY  06/07/2011   Procedure: COMPUTER ASSISTED TOTAL KNEE ARTHROPLASTY;  Surgeon: Mcarthur Rossetti, MD;  Location: [facility];  Service: Orthopedics;  Laterality: Right;  Right total knee arthoplasty   KNEE CLOSED REDUCTION  07/19/2011   Procedure: CLOSED MANIPULATION KNEE;  Surgeon: Mcarthur Rossetti, MD;  Location: [facility];  Service: Orthopedics;  Laterality: Right;  Manipulation under anesthesia right knee   LEFT HEART CATH AND CORONARY ANGIOGRAPHY N/A 08/21/2019   Procedure: LEFT HEART CATH AND CORONARY ANGIOGRAPHY;  Surgeon: Belva Crome, MD;  Location: [place];  Service: Cardiovascular; Mid to distal RCA CTO with faint left-to-right collaterals.  Widely patent left main.  Mid to distal LCx mild luminal irregularities.  Multiple OM branches.  Large draping LAD with several small diagonal branches.  EF ~40 to 45% with inferior AK.   METATARSAL HEAD EXCISION Left 10/11/2020  Procedure: METATARSAL HEAD EXCISION;  Surgeon: Landis Martins, DPM;  Location: [facility];  Service: Podiatry;  Laterality: Left;  4th metatarsal   RADIOLOGY WITH ANESTHESIA N/A 10/14/2020   Procedure: IR WITH ANESTHESIA;  Surgeon: Radiologist, Medication, MD;  Location: [facility];  Service: Radiology;  Laterality: N/A;   RIGHT/LEFT HEART CATH AND CORONARY ANGIOGRAPHY N/A 09/28/2020   Procedure: RIGHT/LEFT HEART CATH AND CORONARY ANGIOGRAPHY;  Surgeon:  Nelva Bush, MD;  Location: [place];  Service: Cardiovascular;; Single-vessel CAD with CTO of mid RCA.  Mild disease involving the LCA (LAD & LCx w/ multiple OM).  Stable since 2021.  Mild to mod elevated LVEDP.  Normal-mildly reduced CO/CI - 5.2, 3.3 ([place]), 4.6, 2.8 by (TD). RA 7 , RV 32/7, PA 29/20, PCWP 22.   TOTAL HIP ARTHROPLASTY Right 11/11/2014   Procedure: RIGHT TOTAL HIP ARTHROPLASTY ANTERIOR APPROACH;  Surgeon: Mcarthur Rossetti, MD;  Location: [facility];  Service: Orthopedics;  Laterality: Right;   TOTAL HIP ARTHROPLASTY Left 01/29/2015   Procedure: LEFT TOTAL HIP ARTHROPLASTY ANTERIOR APPROACH;  Surgeon: Mcarthur Rossetti, MD;  Location: [facility];  Service: Orthopedics;  Laterality: Left;   TRANSTHORACIC ECHOCARDIOGRAM  08/22/2019   EF 50 to 55%.  GR 1 DD. Mild Inferior Hypokinesis.   TRANSTHORACIC ECHOCARDIOGRAM  09/27/2019   EF 30-35%. Moderately reduced LVEF with Inferior/Inferoseptal Akinesis. Elevated LAP with mild LA dilation - Gr II DD. Mild-Mod AI, no AS.   Social History:  reports that he quit smoking about 16 years ago. His smoking use included cigarettes. He has never used smokeless tobacco. He reports that he does not drink alcohol and does not use drugs.  Family / Support Systems Marital Status: Single Patient Roles: Other (Comment) (cousin) Other Supports: Jodean Lima [phone] Anticipated Caregiver: [name] along with CAP aide Ability/Limitations of Caregiver: Kara Dies is retired and can assist-aide was assisting prior to admission Caregiver Availability: 24/7 Family Dynamics: Limited family support has lived with [name] for 17 years and both get along well. She does not want him in a facility and will provide assist. Some extended family available to check on also  Social History Preferred language: English Religion: None Cultural Background: No issues Education: Little schooling Read: No Write: No Employment Status: Disabled Special educational needs teacher Issues: No issues Guardian/Conservator: None-according to MD pt is not fully capable of making his own decisions while here. Will look toward [name] to make any decisions while here, since she is his next of kin   Abuse/Neglect Abuse/Neglect Assessment Can Be Completed: Yes Physical Abuse: Denies Verbal Abuse: Denies Sexual Abuse: Denies Exploitation of patient/patient's resources: Denies Self-Neglect: Denies  Emotional Status Pt's affect, behavior and adjustment status: Pt is glad to be here and says will do his best.He was trying to eat a banana when worker interviewed him. He is autisic and had multiple other health issues. He wants to do what he can for himself Recent Psychosocial Issues: other health issues and foot osteomylitis Psychiatric History: Pt is autistic and limited cognitively. He does answer questions and will do what is asked of him according to cousin Substance Abuse History: No issues  Patient / Family Perceptions, Expectations & Goals Pt/Family understanding of illness & functional limitations: Pt is aware he had a stroke and the side that is weaker. His cousin can explain his health issues and medical procedures. She has spoke with the MD caring for pt and feels has a good understanding of his plan going forward. Premorbid pt/family roles/activities: Cousin, friend,  etc Anticipated changes in roles/activities/participation: resume Pt/family expectations/goals: Pt states: " I hope to do well, get stronger."  Vernal states: " I hope he can recover from this stroke he seems to be doing better."  [employer]: Other (Comment) Premorbid Home Care/DME Agencies: Other (Comment) (CAP -aide 40 hrs per week-multiple equipment-hos bed,power wc, 3 in 1, chair lift, crutches) Transportation available at discharge: Cousin-access  Discharge Planning Living Arrangements: Other relatives Support Systems: Other relatives Type of  Residence: Private residence Insurance Resources: [employer], [employer] (specify county) Museum/gallery curator Resources: [employer] Financial Screen Referred: No Living Expenses: Lives with family Money Management: Family Does the patient have any problems obtaining your medications?: No Home Management: Cousin and aide Patient/Family Preliminary Plans: Return home with cusin and aide he already has. Aide there 40 hrs per week and seven days splits it up. Cousin can assist if needed she is there. Will await evaluations and work on discharge needs, unsure what else he may need at home Care Coordinator Anticipated Follow Up Needs: HH/OP  Clinical Impression Pleasant gentleman who is willing to try in therapies and make progress while here. His has lived with his cousin for 17 years and she has helped also. Will await therapy evaluations and work on discharge needs. Aide will resume once discharged.  Elease Hashimoto 10/20/2020, 10:06 AM

## 2020-10-20 NOTE — Progress Notes (Signed)
[facility] Individual Statement of Services  Patient Name:  Jonathan [name]  Date:  10/20/2020  Welcome to the [facility].  Our goal is to provide you with an individualized program based on your diagnosis and situation, designed to meet your specific needs.  With this comprehensive rehabilitation program, you will be expected to participate in at least 3 hours of rehabilitation therapies Monday-Friday, with modified therapy programming on the weekends.  Your rehabilitation program will include the following services:  Physical Therapy (PT), Occupational Therapy (OT), Speech Therapy (ST), 24 hour per day rehabilitation nursing, Therapeutic Recreaction (TR), Care Coordinator, Rehabilitation Medicine, Nutrition Services, and Pharmacy Services  Weekly team conferences will be held on Tuesday to discuss your progress.  Your Inpatient Rehabilitation Care Coordinator will talk with you frequently to get your input and to update you on team discussions.  Team conferences with you and your family in attendance may also be held.  Expected length of stay: 7-10 days  Overall anticipated outcome: min-mod assist level  Depending on your progress and recovery, your program may change. Your Inpatient Rehabilitation Care Coordinator will coordinate services and will keep you informed of any changes. Your Inpatient Rehabilitation Care Coordinator's name and contact numbers are listed  below.  The following services may also be recommended but are not provided by the [facility]:   [facility] will be made to provide these services after discharge if needed.  Arrangements include referral to agencies that provide these services.  Your insurance has been verified to be:  [employer] Your primary doctor is:  PCP in [place]-cousin to get name for worker  Pertinent  information will be shared with your doctor and your insurance company.  Inpatient Rehabilitation Care Coordinator:  Ovidio Kin, [place] or Emilia Beck  Information discussed with and copy given to patient by: Elease Hashimoto, 10/20/2020, 10:58 AM

## 2020-10-20 NOTE — Progress Notes (Signed)
Orthopedic Tech Progress Note Patient Details:  Jonathan [name] 01/26/1961 [id number]  Ortho Devices Type of Ortho Device: Darco shoe Ortho Device/Splint Location: LLE Ortho Device/Splint Interventions: Ordered, Application, Adjustment   Post Interventions Patient Tolerated: Well Instructions Provided: Care of [place] 10/20/2020, 9:47 AM

## 2020-10-20 NOTE — Discharge Instructions (Addendum)
Inpatient Rehab Discharge Instructions  Jonathan [name] Discharge date and time: No discharge date for patient encounter.   Activities/Precautions/ Functional Status: Activity: activity as tolerated Diet: regular diet Wound Care: Surgi lube and dry dressing to graft site.  Leave the Adaptic and Steri-Strips in place.  Just put the lube  on top and then cover with dry dressing left foot daily Functional status:  ___ No restrictions     ___ Walk up steps independently ___ 24/7 supervision/assistance   ___ Walk up steps with assistance ___ Intermittent supervision/assistance  ___ Bathe/dress independently ___ Walk with walker     __x_ Bathe/dress with assistance ___ Walk Independently    ___ Shower independently ___ Walk with assistance    ___ Shower with assistance ___ No alcohol     ___ Return to work/school ________  Special Instructions: No driving smoking or alcohol    COMMUNITY REFERRALS UPON DISCHARGE:    Home Health:   PT, OT, SP, [place] Phone: [phone]  AIDE TO RESUME HER 40 HOURS PER WEEK  Medical Equipment/Items Ordered:ROLLING WALKER & 3 IN 1                                                 Agency/Supplier: [facility]  [phone]    STROKE/TIA DISCHARGE INSTRUCTIONS SMOKING Cigarette smoking nearly doubles your risk of having a stroke & is the single most alterable risk factor  If you smoke or have smoked in the last 12 months, you are advised to quit smoking for your health. Most of the excess cardiovascular risk related to smoking disappears within a year of stopping. Ask you doctor about anti-smoking medications [place] Quit Line: 1-800-QUIT NOW Free Smoking Cessation Classes [phone]  CHOLESTEROL Know your levels; limit fat & cholesterol in your diet  Lipid Panel     Component Value Date/Time   CHOL 160 10/15/2020 0500   TRIG 103 10/15/2020 0500   TRIG 105 10/15/2020 0500   HDL 38 (L) 10/15/2020 0500    CHOLHDL 4.2 10/15/2020 0500   VLDL 21 10/15/2020 0500   LDLCALC 101 (H) 10/15/2020 0500     Many patients benefit from treatment even if their cholesterol is at goal. Goal: Total Cholesterol (CHOL) less than 160 Goal:  Triglycerides (TRIG) less than 150 Goal:  HDL greater than 40 Goal:  LDL (LDLCALC) less than 100   BLOOD PRESSURE American Stroke Association blood pressure target is less that 120/80 mm/Hg  Your discharge blood pressure is:  BP: (!) 97/57 Monitor your blood pressure Limit your salt and alcohol intake Many individuals will require more than one medication for high blood pressure  DIABETES (A1c is a blood sugar average for last 3 months) Goal HGBA1c is under 7% (HBGA1c is blood sugar average for last 3 months)  Diabetes: No known diagnosis of diabetes    Lab Results  Component Value Date   HGBA1C 5.5 10/15/2020    Your HGBA1c can be lowered with medications, healthy diet, and exercise. Check your blood sugar as directed by your physician Call your physician if you experience unexplained or low blood sugars.  PHYSICAL ACTIVITY/REHABILITATION Goal is 30 minutes at least 4 days per week  Activity: Increase activity slowly, Therapies: Physical Therapy: Home Health Return to  work:  Activity decreases your risk of heart attack and stroke and makes your heart stronger.  It helps control your weight and blood pressure; helps you relax and can improve your mood. Participate in a regular exercise program. Talk with your doctor about the best form of exercise for you (dancing, walking, swimming, cycling).  DIET/WEIGHT Goal is to maintain a healthy weight  Your discharge diet is:  Diet Order             Diet Carb Modified Fluid consistency: Thin; Room service appropriate? Yes with Assist  Diet effective now                   liquids Your height is:  Height: 5\' 3"  (160 cm) Your current weight is: Weight: 64.1 kg (bed scale) Your Body Mass Index (BMI) is:  BMI  (Calculated): 25.04 Following the type of diet specifically designed for you will help prevent another stroke. Your goal weight range is:   Your goal Body Mass Index (BMI) is 19-24. Healthy food habits can help reduce 3 risk factors for stroke:  High cholesterol, hypertension, and excess weight.  RESOURCES Stroke/Support Group:  Call [phone]   STROKE EDUCATION PROVIDED/REVIEWED AND GIVEN TO PATIENT Stroke warning signs and symptoms How to activate emergency medical system (call 911). Medications prescribed at discharge. Need for follow-up after discharge. Personal risk factors for stroke. Pneumonia vaccine given: No Flu vaccine given: No My questions have been answered, the writing is legible, and I understand these instructions.  I will adhere to these goals & educational materials that have been provided to me after my discharge from the hospital.       My questions have been answered and I understand these instructions. I will adhere to these goals and the provided educational materials after my discharge from the hospital.  Patient/Caregiver Signature _______________________________ Date __________  Clinician Signature _______________________________________ Date __________  Please bring this form and your medication list with you to all your follow-up doctor's appointments.

## 2020-10-20 NOTE — Patient Care Conference (Signed)
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 Leeroy Cha Care Coodinator Present: Dorien Chihuahua, RN, BSN, CRRN;Becky Dupree, LCSW Nurse Present: Dorien Chihuahua, RN PT Present: Ginnie Smart, PT OT Present: Leretha Pol, OT SLP Present: Charolett Bumpers, SLP PPS Coordinator present : Gunnar Fusi, SLP     Current Status/Progress Goal Weekly Team Focus  Bowel/Bladder   Pt is incont of B/B. LBM 10/19/20  Pt to gain cont of B/B.  Toilet pt q2h and PRN   Swallow/Nutrition/ Hydration             ADL's             Mobility   Eval Pending         Communication   Eval pending  Eval pending      Safety/Cognition/ Behavioral Observations  Eval pending  Eval pending      Pain   Pt states no pain  Pt remains pain free  Assess for pain qshift and offer PRN medications per order   Skin   Pt skin has a ulcer to R-ankle covered with foam and a L- chronic foot wound. Graft site dressing changes (surgilube with dry dressing daily)  Pt skin is clean with no addtional breakdown or injury  Pt skin care provided qshift     Discharge Planning:  New eval-lives with cousin and has aide unsure how often will confirm with family   Team Discussion: Chronic foot wound left foot with surgery and right ankle wound. DARCO shoe obtained for left  foot.  Patient with muscular pain; order obtained for k-pad. Juven added per MD. Progress limited by wounds and multi contractures. Team reviewed goals for discharge and premorbid functional status including use of crutches and aide daily to assist cousin with patient care.  Patient on target to meet rehab goals: Currently total assist for wound care and medications. Max assist transfers and no gait as of yet.  From a language perspective, 70% accuracy with basic questions. Min - mod assist goals set for discharge.  *See Care Plan and progress notes for long and short-term goals.   Revisions to Treatment Plan:  Working on transfers, speaking in phrases/sentence level, memory and problem solving. Teaching Needs: Wound care, toileting, transfers, medications, etc  Current Barriers to Discharge: Decreased caregiver support, Home enviroment access/layout, Incontinence, and Wound care  Possible Resolutions to Barriers: Confirm premorbid functional level and assistance required PTA HH follow up services     Medical Summary Current Status: high level autism, diffuse muscular pain, hypotensive, hypoalbuminemia, chronic left foot wound, sleeping well at night, eating well.  Barriers to Discharge: Medical stability  Barriers to Discharge Comments: high level autism, diffuse muscular pain, hypotensive, hypoalbuminemia, chronic left foot wound Possible Resolutions to [employer] Focus: added kpad, decrease lisinopril to 2.5mg , added juven, ordered Darco wound,  continue daily wound care.   Continued Need for Acute Rehabilitation Level of Care: The patient requires daily medical management by a physician with specialized training in physical medicine and rehabilitation for the following reasons: Direction of a multidisciplinary physical rehabilitation program to maximize functional independence : Yes Medical management of patient stability for increased activity during participation in an intensive  rehabilitation regime.: Yes Analysis of laboratory values and/or radiology reports with any subsequent need for medication adjustment and/or medical intervention. : Yes   I attest that I was present, lead the team conference, and concur with the assessment and plan of the team.   Margarito Liner 10/20/2020, 4:47 PM

## 2020-10-21 DIAGNOSIS — M069 Rheumatoid arthritis, unspecified: Secondary | ICD-10-CM

## 2020-10-21 NOTE — Progress Notes (Signed)
Speech Language Pathology Daily Session Note  Patient Details  Name: Jonathan [name] MRN: [id number] Date of Birth: 12-Feb-1961  Today's Date: 10/21/2020 SLP Individual Time: 1420-1500 SLP Individual Time Calculation (min): 40 min  Short Term Goals: Week 1: SLP Short Term Goal 1 (Week 1): Pt will respond to basic yes/no questions with 80 % accuracy given mod A visual/verbal cues. SLP Short Term Goal 2 (Week 1): Pt will name common objects with 80% accuracy given supervision A semantic and sentence completion cues. SLP Short Term Goal 3 (Week 1): Pt will able to express at phrase/simple sentence level with 80% accuracy syntax/content with min A multimodal cues. SLP Short Term Goal 4 (Week 1): Pt will demonstrate self-monitoring of verbal errors with max A multimodal cues. SLP Short Term Goal 5 (Week 1): Pt will follow 2 step commands with min A verbal/visual cues. SLP Short Term Goal 6 (Week 1): Pt will demonstrate reading comprehension at phrase level matching to objects in a field of 3 with 80% accuracy and min semantic and sentence completion cues.  Skilled Therapeutic Interventions:Skilled ST services focused on language skills. Pt demonstrates improved error awareness and expressive speech compared to on evaluation. SLP facilitated naming of common objects, pt demonstrated 80% accuracy (8/10 trials) and 10/10 with sentence completion/phonetic cues then at the end of the session pt was able to name same objects with 90% accuracy (9/10 trials) and 10/10 with demonstration/sentence completion cue. Pt was able to name object function with 40% accuracy (4/10 opportunities) and with max sentence completion and demonstration cues named 10/10 functions at phrase level. Pt was able to match objects in a field of 3 to phrase/function with 80% accuracy (8/10 trials) and read phrases with 80% accuracy (8/10 trials) with initial difficulty scanning to the left but ability to carryover scanning strategy. Pt  required mod A verbal cues for awareness for verbal errors. Pt demonstrated ability to respond to biographical/environment yes/no questions in 5/5 trials, yes/no question pertaining to object function in 7/7 trials and complex yes/no questions with 40% accuracy (4/10 trials.) Pt was left in room with call bell within reach and chair alarm set. SLP recommends to continue skilled services.     Pain Pain Assessment Pain Score: 0-No pain  Therapy/Group: Individual Therapy  Davon Folta  [facility] 10/21/2020, 3:41 PM

## 2020-10-21 NOTE — Progress Notes (Addendum)
Occupational Therapy Session Note  Patient Details  Name: Jonathan [name] MRN: [id number] Date of Birth: 12/07/1960  Today's Date: 10/21/2020 OT Individual Time: 9244-6286; 3817-7116 OT Individual Time Calculation (min): 60 min ; 45 min   Short Term Goals: Week 1:  OT Short Term Goal 1 (Week 1): Pt will self feed using AE as needed with max assist OT Short Term Goal 2 (Week 1): Pt will complete toilet transfer with mod assist OT Short Term Goal 3 (Week 1): Pt will complete toileting with max assist   Skilled Therapeutic Interventions/Progress Updates:    First session: Pt sitting up in w/c, c/o 4/10 pain in BLE with 8/10 pain more specifically in left foot. Nurse made aware.  Pt agreeable to washing up and requesting to do so at the sink.  He states he usually washes up at sinkside at home.  Pt required max assist to doff shirt overhead.   Educated pt on use of long handled sponge with built up handle added for ease of grasp.  Pt bathed UB with mod assist using long handled sponge.  Pt donned shirt overhead with max assist.  Pt then completed sit<>stand at sink with mod assist in order to pull brief and pants over hips and wash peri area.  Pt able to reach most of buttocks however required assist for thoroughness.  Pt bathed LB using wash cloth and long handled sponge for lower part of BLE with min assist.  Pt required total assist to donn brief and pants at sit<>stand level.  Pt exhibits difficulty pinching clothing with bilateral hands due to weakness and joint laxity in all digits including thumb (likely secondary to diagnosis of RA).  Shoes and socks donned with dependence.  Pt sitting in w/c, call bell in reach, seat alarm on at end of session.  Second session:  Pt sitting up in w/c, reports same amount of pain as prior session.  Pt agreeable to OT session.  Pt completed stand pivot transfer w/c to 3 in 1 commode placed over toilet with mod assist (therapist positioned in front of pt to  assist).  Pt required max assist for clothing mgt in standing.  Stand to sit and then completed stand pivot back to w/c overall with mod assist. Trial of stedy for toilet transfer completed and pt able to complete with mod assist.  Changed safety sheet to reflect pts improved functional transfer status.  Pt reports he did not have to use toilet when sitting on 3 in1 commode however after transfer training completed, pt reported incontinence of urine episode. Pt reports urge to urinate came on suddenly when standing and sitting;  he reports he did not feel urgency to use bathroom prior to hospitilization.  Pericare and clothing mgt completed at sink sit<>stand level with max assist.  Pt returned to sitting in w/c, call bell in reach, seat alarm on at end of session.  Pt possibly exhibiting signs of urge incontinence this session, will need to further assess in functional context.  Therapy Documentation Precautions:  Precautions Precautions: Fall Required Braces or Orthoses: Other Brace Other Brace: post op shoe Restrictions Weight Bearing Restrictions: No LLE Weight Bearing: Weight bearing as tolerated Other Position/Activity Restrictions: in post-op shoe   Therapy/Group: Individual Therapy  Ezekiel Slocumb 10/21/2020, 2:18 PM

## 2020-10-21 NOTE — Progress Notes (Signed)
Physical Therapy Session Note  Patient Details  Name: Jonathan [name] MRN: [id number] Date of Birth: 08/04/1960  Today's Date: 10/21/2020 PT Individual Time: 7116-5790 PT Individual Time Calculation (min): 54 min   Short Term Goals: Week 1:  PT Short Term Goal 1 (Week 1): Pt will complete bed mobility with minA PT Short Term Goal 2 (Week 1): Pt will complete sit<>stand transfer with modA and LRAD PT Short Term Goal 3 (Week 1): Pt will complete bed<>chair transfer with modA and LRAD PT Short Term Goal 4 (Week 1): Pt will ambulate 43ft with modA and LRAD  Skilled Therapeutic Interventions/Progress Updates:    Pt received supine in bed to start session, agreeable to PT tx - RN at bedside administering morning medications. Pt denies pain and is cooperative and pleasant throughout session. Pt's w/c from yesterday missing in the room so needed to retreive a 16x16 w/c with ROHO cushion (no 16x16 w/c available in DME closet, retrieved 18x16 but would be better fitting in a 16x16).   Rolled in bed with supervision to don pants which required maxA. Supine<>sit required modA with HOB slightly elevated, assist needed for trunk management. Able to sit unsupported at EOB and donned darco post-op shoe to L foot and croc shoe for R foot with totalA. Required maxA for removing dirty t-shirt and maxA for donning a new t-shirt. Provided soaped washcloth for his back and under arms and pt appreciative. Sit<>stand from raised EOB with modA with no AD and completed stand<>pivot with modA via face-to-face technique with cues for stepping technique. Wheeled in w/c to ortho gym for time management.   Completed stand<>pivot transfer with modA and no AD (similar technique as above) from w/c to mat table, improved initiation noted and ability to clear feet during stepping sequencing for the transfer. Worked on repeated sit<>stands with minA from raised mat table height - cues for thoracic elongation and hip extension to  promote full upright - posterior bias in standing.   Next, able to perform ambulatory transfer (70ft) with min/modA with face-to-face technique with cues needed for increasing step length and step height. Gait is short shuffling steps. He required assistance for foot placement onto foot paddles of Nustep. He completed a total of 6 minutes of Nustep, using BLE's only, workload set to 4 (fading to 2 for last minute). He needed a rest break at minute 2, 4, and 5 due to fatigue. He required modA for stand<>pivot transfer off of Nustep to his w/c and was wheeled back to his room.   Pt requesting to remain seated in w/c at end of session. Safety belt alarm applied and all needs within reach.   Therapy Documentation Precautions:  Precautions Precautions: Fall Required Braces or Orthoses: Other Brace Other Brace: post op shoe Restrictions Weight Bearing Restrictions: No LLE Weight Bearing: Weight bearing as tolerated Other Position/Activity Restrictions: in post-op shoe General:    Therapy/Group: Individual Therapy  Alger Simons 10/21/2020, 7:29 AM

## 2020-10-21 NOTE — Progress Notes (Signed)
PROGRESS NOTE   Subjective/Complaints: Pt c/o back pain. Says that kpad helps as well as tylenol  ROS: Limited due to cognitive/behavioral   Objective:   No results found. Recent Labs    10/20/20 0434  WBC 6.2  HGB 9.3*  HCT 29.9*  PLT 308   Recent Labs    10/20/20 0434  NA 136  K 4.0  CL 105  CO2 24  GLUCOSE 108*  BUN 8  CREATININE 0.48*  CALCIUM 9.5    Intake/Output Summary (Last 24 hours) at 10/21/2020 0846 Last data filed at 10/21/2020 0753 Gross per 24 hour  Intake 960 ml  Output --  Net 960 ml        Physical Exam: Vital Signs Blood pressure (!) 107/52, pulse 79, temperature 98.1 F (36.7 C), temperature source Oral, resp. rate 15, height 5' 3"  (1.6 m), weight 62.2 kg, SpO2 98 %. Constitutional: No distress . Vital signs reviewed. HEENT: EOMI, oral membranes moist Neck: supple Cardiovascular: RRR without murmur. No JVD    Respiratory/Chest: CTA Bilaterally without wheezes or rales. Normal effort    GI/Abdomen: BS +, non-tender, non-distended Ext: no clubbing, cyanosis, or edema Psych: pleasant and cooperative  Skin:    General: Skin is warm.    Comments: Heavy bulky dressing in place to left foot, staples present, mild serosanguinous dressing. Skin remains very dry.   Neurological:    Mental Status: He is alert.    Comments: awake and alert. Follows commands. Dysconjugate gaze. Nystagmus with left lateral gaze, right eye wanders with left gaze as well. P  Mild left lid ptosis.   Left facial weakness. Provides his name and age with some delay in processing and word finding.  Marland Kitchen RUE 4-/5. LUE 4+/5. RLE 3-4/5. LLE 3+/5 prox to distal, limited by wound on foot. Sensed pain in all 4's. Musculoskeletal:        General: No swelling or tenderness.    Cervical back: Normal range of motion.    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 Functional deficits which  require 3+ hours per day of interdisciplinary therapy in a comprehensive inpatient rehab setting. Physiatrist is providing close team supervision and 24 hour management of active medical problems listed below. Physiatrist and rehab team continue to assess barriers to discharge/monitor patient progress toward functional and medical goals  Care Tool:  Bathing              Bathing assist Assist Level: Total Assistance - Patient < 25%     Upper Body Dressing/Undressing Upper body dressing   What is the patient wearing?: Pull over shirt    Upper body assist Assist Level: Total Assistance - Patient < 25%    Lower Body Dressing/Undressing Lower body dressing      What is the patient wearing?: Incontinence brief     Lower body assist Assist for lower body dressing: Dependent - Patient 0%     Toileting Toileting    Toileting assist Assist for toileting: Dependent - Patient 0%     Transfers Chair/bed transfer  Transfers assist     Chair/bed transfer assist level: Maximal Assistance - Patient  25 - 49%     Locomotion Ambulation   Ambulation assist   Ambulation activity did not occur: Safety/medical concerns          Walk 10 feet activity   Assist  Walk 10 feet activity did not occur: Safety/medical concerns        Walk 50 feet activity   Assist Walk 50 feet with 2 turns activity did not occur: Safety/medical concerns         Walk 150 feet activity   Assist Walk 150 feet activity did not occur: Safety/medical concerns         Walk 10 feet on uneven surface  activity   Assist Walk 10 feet on uneven surfaces activity did not occur: Safety/medical concerns         Wheelchair     Assist Will patient use wheelchair at discharge?: No   Wheelchair activity did not occur: N/A         Wheelchair 50 feet with 2 turns activity    Assist    Wheelchair 50 feet with 2 turns activity did not occur: N/A       Wheelchair 150  feet activity     Assist  Wheelchair 150 feet activity did not occur: N/A       Blood pressure (!) 107/52, pulse 79, temperature 98.1 F (36.7 C), temperature source Oral, resp. rate 15, height 5' 3"  (1.6 m), weight 62.2 kg, SpO2 98 %.    Medical Problem List and Plan: 1.  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  Impaired mobility, ambulating 40 feet -DVT/anticoagulation: Continue Lovenox             -antiplatelet therapy: Aspirin 81 mg daily and Plavix 75 mg day x3 weeks then Plavix alone.  Recommendations of 30-day cardiac event monitor 3. Diffuse muscle aches: Neurontin 300 mg twice daily.   -kpad helpful for low/mid back pain, using tylenol also with benefit 4. Mood: Provide emotional support             -antipsychotic agents: N/A 5. Neuropsych: This patient is not capable of making decisions on his own behalf. 7. Fluids/Electrolytes/Nutrition: Routine in and outs with follow-up chemistries on admit 8.  Chronic left foot wound.  Status post left foot incision and drainage debridement of fourth met head resection 10/11/2020.   -Follow-up per Dr. Cannon Kettle podiatry services as outpt -IV Ancef and Vibramycin course completed -continue daily dressing to foot -ordered Darco boot for gait/wb 9.  Systolic congestive heart failure.  Follow-up cardiology service.  Aldactone resumed 25 mg daily.  Monitor for any signs of fluid overload             -daily weights   Filed Weights   10/19/20 1430 10/20/20 0357 10/21/20 0536  Weight: 60 kg 64.1 kg 62.2 kg    10.  Hypertension/nonsustained VT.  Lisinopril 5 mg daily, Imdur 60 mg daily, Toprol-XL 25  mg daily.  Monitor with increased mobility  6/28: hypotensive: decrease lisinopril to 2.87m  6/29 bp still soft, pt asymptomatic  11.  RA.  Chronic prednisone.  We will discuss resuming Arava 12.  High-level autism.    13. Hypoalbuminemia: added Juven   LOS: 2 days A FACE TO FACE EVALUATION WAS PERFORMED  Meredith Staggers 10/21/2020, 8:46 AM

## 2020-10-22 ENCOUNTER — Other Ambulatory Visit: Payer: Self-pay | Admitting: Home Health

## 2020-10-22 DIAGNOSIS — I639 Cerebral infarction, unspecified: Secondary | ICD-10-CM

## 2020-10-22 MED ORDER — LEFLUNOMIDE 20 MG PO TABS
10.0000 mg | ORAL_TABLET | Freq: Every day | ORAL | Status: DC
  Administered 2020-10-22 – 2020-10-30 (×9): 10 mg via ORAL
  Filled 2020-10-22 (×9): qty 0.5

## 2020-10-22 MED ORDER — DICLOFENAC SODIUM 1 % EX GEL
2.0000 g | Freq: Four times a day (QID) | CUTANEOUS | Status: DC
  Administered 2020-10-22 – 2020-10-30 (×22): 2 g via TOPICAL
  Filled 2020-10-22: qty 100

## 2020-10-22 NOTE — Progress Notes (Addendum)
Occupational Therapy Session Note  Patient Details  Name: Jonathan [name] MRN: [id number] Date of Birth: May 06, 1960  Today's Date: 10/22/2020 OT Individual Time: 8527-7824; 1300-1400 OT Individual Time Calculation (min): 45 min ; and 60 min   Short Term Goals: Week 1:  OT Short Term Goal 1 (Week 1): Pt will self feed using AE as needed with max assist OT Short Term Goal 2 (Week 1): Pt will complete toilet transfer with mod assist OT Short Term Goal 3 (Week 1): Pt will complete toileting with max assist  Skilled Therapeutic Interventions/Progress Updates:    First session: Pt semi upright in bed, reports he had bowel movement in his brief. Pt also c/o 8/10 pain in bilateral hands, BLE, and left foot.  Pt required Vcs for sequencing and body mechanics to roll left and right for total dependent pericare and clothing management due to incontinent loose stool event.  Pt completed supine to sit with min assist.  Total dependence to donn Darco shoe on left and standard shoe on right. Stand pivot to w/c using RW with min assist and bed elevated.  Donned w/c gloves to trial for increase support bilateral hands due to joint laxity.  Discussed medication management with nursing due to increased pain in hands noted.  Also requested pt call bell switch to soft press secondary to pt unable to press standard call bell currently.  Pt sitting up in w/c, seat belt alarm on.   Second session: Pt sitting up in w/c, reports no pain currently. Doffed bilateral gloves however, due to pt reports stiffness of joints in hand. Pt would benefit from arthritis gloves to allow for mobility while providing support. Trial of long extended straw and pt able to use with setup.  Also trialed built up foam handle on standard utensil.  Pt able to self feed apple sauce with supervision however somewhat labored. Anticipate pt would benefit from angle utensil to increase ease with self feeding.  Will trial next session.   Pt then  reported he had a bowel movement in his brief.  Stand pivot w/c to EOB using RW with min assist initial power up then CGA for pivot. Pt doffed pants at EOB with total assist (attempted use of reacher however pt unable to grasp handle due to hand size and weakness/joint laxity). Min assist sit to supine to elevate LLE.  Pt required Vcs for sequencing and body mechanics to roll left and right for total dependent pericare and clothing management due to incontinent loose stool event.  Nursing made aware of pt would benefit from toileting protocol. Pt reports he would like to remain in bed at end of session.  Call bell in reach, bed alarm on.    Therapy Documentation Precautions:  Precautions Precautions: Fall Required Braces or Orthoses: Other Brace Other Brace: post op shoe Restrictions Weight Bearing Restrictions: No LLE Weight Bearing: Weight bearing as tolerated Other Position/Activity Restrictions: in post-op shoe   Therapy/Group: Individual Therapy  Ezekiel Slocumb 10/22/2020, 3:19 PM

## 2020-10-22 NOTE — Progress Notes (Signed)
Event monitor ordered for CVA, follow up with PCP/and cardiology Dr Percival Spanish

## 2020-10-22 NOTE — IPOC Note (Signed)
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)   Left middle cerebral artery stroke [facility])     Functional Problem List: Nursing Bladder, Bowel, Medication Management, Safety, Endurance  PT Balance, Endurance, Motor, Nutrition, Pain, Safety, Sensory, Skin Integrity  OT Balance, Safety, Perception, Cognition, Sensory, Endurance, Vision, Motor, Pain  SLP Cognition  TR         Basic ADL's: OT Eating, Grooming, Toileting     Advanced  ADL's: OT       Transfers: PT Bed Mobility, Bed to Chair, Teacher, early years/pre, Tub/Shower     Locomotion: PT Ambulation, Stairs     Additional Impairments: OT None  SLP Communication, Social Cognition comprehension, expression Awareness  TR      Anticipated Outcomes Item Anticipated Outcome  Self Feeding mod assist  Swallowing      Basic self-care  grooming: mod assist  Toileting  mod assist   Bathroom Transfers min assist  Bowel/Bladder  manage bowel and bladder with mod I assist  Transfers  minA with LRAD  Locomotion  minA with LRAD  Communication  min-supervisin A  Cognition  max A error awarenes  Pain  at or below level 4  Safety/Judgment  maintain safety with cues/reminders   Therapy Plan: PT Intensity: Minimum of 1-2 x/day ,45 to 90 minutes PT Frequency: 5 out of 7 days PT Duration Estimated Length of Stay: 10 days OT Intensity: Minimum of 1-2 x/day, 45 to 90 minutes OT Frequency: 5 out of 7 days OT Duration/Estimated Length of Stay: 10 days SLP Intensity: Minumum of 1-2 x/day, 30 to 90 minutes SLP Frequency: 3 to 5 out of 7 days SLP Duration/Estimated Length of Stay: 7/8   Due to the current state of emergency, patients may not be receiving their 3-hours of Medicare-mandated therapy.   Team  Interventions: Nursing Interventions Patient/Family Education, Bowel Management, Disease Management/Prevention, Bladder Management, Medication Management, Discharge Planning, Pain Management  PT interventions Ambulation/gait training, Discharge planning, Functional mobility training, Psychosocial support, Therapeutic Activities, Visual/perceptual remediation/compensation, Wheelchair propulsion/positioning, Therapeutic Exercise, Skin care/wound management, Neuromuscular re-education, Disease management/prevention, Training and development officer, Cognitive remediation/compensation, DME/adaptive equipment instruction, Pain management, Splinting/orthotics, UE/LE Strength taining/ROM, UE/LE Coordination activities, Stair training, Patient/family education, Community reintegration  OT Interventions Training and development officer, Discharge planning, Pain management, Self Care/advanced ADL retraining, Therapeutic Activities, UE/LE Coordination activities, Cognitive remediation/compensation, Disease mangement/prevention, Functional mobility training, Patient/family education, Therapeutic Exercise, Visual/perceptual remediation/compensation, DME/adaptive equipment instruction, Neuromuscular re-education, Psychosocial support, Splinting/orthotics, UE/LE Strength taining/ROM, Wheelchair propulsion/positioning  SLP Interventions Cognitive remediation/compensation, English as a second language teacher, Functional tasks, Environmental controls, Internal/external aids, Speech/Language facilitation, Patient/family education  TR Interventions    SW/CM Interventions Discharge Planning, Psychosocial Support, Patient/Family Education   Barriers to Discharge MD  Medical stability  Nursing Home environment access/layout 1 level 5 ste bil rails with care aides 7day/we; hosp bed, poqwer w/, BSC, SS, Cane and lift chair  PT Inaccessible home environment, Decreased caregiver support, Home environment access/layout, Wound Care, Lack of/limited family  support, Insurance for SNF coverage, Weight, Weight bearing restrictions, Medication compliance, Nutrition means    OT      SLP      SW       Team Discharge Planning: Destination: PT-Home ,OT- Home , SLP-Home Projected Follow-up: PT-Home health PT, 24 hour supervision/assistance, OT-  24 hour supervision/assistance, Home health OT, SLP-Home Health SLP, 24 hour supervision/assistance Projected  Equipment Needs: PT-To be determined, OT- To be determined, SLP-None recommended by SLP Equipment Details: PT- , OT-  Patient/family involved in discharge planning: PT- Patient,  OT-Patient, SLP-Patient  MD ELOS: 14-16 days Medical Rehab Prognosis:  Excellent Assessment: Jonathan [name] is a 60 year old man admitted to CIR with right side weakness and aphasia secondary to left MCA infarction due to left M1 occlusion status post revascularization after left foot incision and drainage with debridement of fourth met head resection 10/11/2020. Medications are being managed, and labs and vitals are being monitored regularly.      See Team Conference Notes for weekly updates to the plan of care

## 2020-10-22 NOTE — Progress Notes (Signed)
Physical Therapy Session Note  Patient Details  Name: Jonathan [name] MRN: [id number] Date of Birth: 07-20-1960  Today's Date: 10/22/2020 PT Individual Time: 1000-1055 PT Individual Time Calculation (min): 55 min   Short Term Goals: Week 1:  PT Short Term Goal 1 (Week 1): Pt will complete bed mobility with minA PT Short Term Goal 2 (Week 1): Pt will complete sit<>stand transfer with modA and LRAD PT Short Term Goal 3 (Week 1): Pt will complete bed<>chair transfer with modA and LRAD PT Short Term Goal 4 (Week 1): Pt will ambulate 67ft with modA and LRAD  Skilled Therapeutic Interventions/Progress Updates:    Pt received sitting in w/c to start session. He's agreeable to PT tx but on arrival he says "I ain't doing too good." When questioned, he reported his "medications were off" and that he had a "headache." He rates it as 8/10. He reports he told his MD this morning and the RN reports he was provided some Tylenol ~30 minutes ago. Pt transported in w/c to main rehab gym for time management. Wheeled in // bars. He required modA for powering to rise in // bars and once standing, required minA for balance. He ambulated length of // bars (~34ft) x2 bouts (seated rest break) with minA overall. He demonstrated antalgic gait pattern on LLE with short shuffling steps and decreased ability to weight shift L to offload R foot. Next, worked on functional gait with RW outside // bars - he continues to require modA for powering to rise to standing from w/c height and minA for balance as he ambulated 73ft + 61ft + 18ft with RW and minA - cues throughout for increasing L step length/height and assist needed for RW management. Gait quality improved with repetition and he demonstrated good safety awareness for turning to sit and stepping backwards to sitting surface. He reports he uses a lift chair at home which explains needing modA for powering to rise to standing. He was returned to his room at end of session and  remained seated in w/c with safety belt alarm on and all needs in reach.   Therapy Documentation Precautions:  Precautions Precautions: Fall Required Braces or Orthoses: Other Brace Other Brace: post op shoe Restrictions Weight Bearing Restrictions: No LLE Weight Bearing: Weight bearing as tolerated Other Position/Activity Restrictions: in post-op shoe General:    Therapy/Group: Individual Therapy  Alger Simons 10/22/2020, 7:36 AM

## 2020-10-22 NOTE — Progress Notes (Signed)
PROGRESS NOTE   Subjective/Complaints: No complaints this morning Smiling, pleasant IV in left forearm- may d/c Last BM 6/29- small- appreciate nursing letting me know  ROS: Limited due to cognitive/behavioral   Objective:   No results found. Recent Labs    10/20/20 0434  WBC 6.2  HGB 9.3*  HCT 29.9*  PLT 308   Recent Labs    10/20/20 0434  NA 136  K 4.0  CL 105  CO2 24  GLUCOSE 108*  BUN 8  CREATININE 0.48*  CALCIUM 9.5    Intake/Output Summary (Last 24 hours) at 10/22/2020 1014 Last data filed at 10/22/2020 0757 Gross per 24 hour  Intake 917 ml  Output --  Net 917 ml        Physical Exam: Vital Signs Blood pressure (!) 101/53, pulse 75, temperature 98 F (36.7 C), temperature source Oral, resp. rate 16, height 5' 3"  (1.6 m), weight 61.7 kg, SpO2 100 %. Gen: no distress, normal appearing HEENT: oral mucosa pink and moist, NCAT Cardio: Reg rate Chest: normal effort, normal rate of breathing Abd: soft, non-distended Ext: no edema Psych: pleasant, normal affect Skin: intact Neuro: Musculoskeletal:  Skin:    General: Skin is warm.    Comments: Heavy bulky dressing in place to left foot, staples present, mild serosanguinous dressing. Skin remains very dry.   Neurological:    Mental Status: He is alert.    Comments: awake and alert. Follows commands. Dysconjugate gaze. Nystagmus with left lateral gaze, right eye wanders with left gaze as well. P  Mild left lid ptosis.   Left facial weakness. Provides his name and age with some delay in processing and word finding.  Marland Kitchen RUE 4-/5. LUE 4+/5. RLE 3-4/5. LLE 3+/5 prox to distal, limited by wound on foot. Sensed pain in all 4's. Musculoskeletal:        General: No swelling or tenderness.    Cervical back: Normal range of motion.    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 Functional deficits which require 3+ hours  per day of interdisciplinary therapy in a comprehensive inpatient rehab setting. Physiatrist is providing close team supervision and 24 hour management of active medical problems listed below. Physiatrist and rehab team continue to assess barriers to discharge/monitor patient progress toward functional and medical goals  Care Tool:  Bathing    Body parts bathed by patient: Chest, Abdomen, Front perineal area, Right upper leg, Left upper leg, Right lower leg, Face     Body parts n/a: Left lower leg   Bathing assist Assist Level: Moderate Assistance - Patient 50 - 74%     Upper Body Dressing/Undressing Upper body dressing   What is the patient wearing?: Pull over shirt    Upper body assist Assist Level: Maximal Assistance - Patient 25 - 49%    Lower Body Dressing/Undressing Lower body dressing      What is the patient wearing?: Incontinence brief, Pants     Lower body assist Assist for lower body dressing: Total Assistance - Patient < 25%     Toileting Toileting    Toileting assist Assist for toileting: Maximal Assistance - Patient  25 - 49%     Transfers Chair/bed transfer  Transfers assist     Chair/bed transfer assist level: Maximal Assistance - Patient 25 - 49%     Locomotion Ambulation   Ambulation assist   Ambulation activity did not occur: Safety/medical concerns          Walk 10 feet activity   Assist  Walk 10 feet activity did not occur: Safety/medical concerns        Walk 50 feet activity   Assist Walk 50 feet with 2 turns activity did not occur: Safety/medical concerns         Walk 150 feet activity   Assist Walk 150 feet activity did not occur: Safety/medical concerns         Walk 10 feet on uneven surface  activity   Assist Walk 10 feet on uneven surfaces activity did not occur: Safety/medical concerns         Wheelchair     Assist Will patient use wheelchair at discharge?: No   Wheelchair activity did  not occur: N/A         Wheelchair 50 feet with 2 turns activity    Assist    Wheelchair 50 feet with 2 turns activity did not occur: N/A       Wheelchair 150 feet activity     Assist  Wheelchair 150 feet activity did not occur: N/A       Blood pressure (!) 101/53, pulse 75, temperature 98 F (36.7 C), temperature source Oral, resp. rate 16, height 5' 3"  (1.6 m), weight 61.7 kg, SpO2 100 %.    Medical Problem List and Plan: 1.  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  Impaired mobility, ambulating 40 feet -DVT/anticoagulation: Continue Lovenox             -antiplatelet therapy: Aspirin 81 mg daily and Plavix 75 mg day x3 weeks then Plavix alone.  Recommendations of 30-day cardiac event monitor 3. Diffuse muscle aches 2/2 RA: Neurontin 300 mg twice daily.   -kpad helpful for low/mid back pain, using tylenol also with benefit  -resumed Anara, added voltaren gel.  4. Mood: Provide emotional support             -antipsychotic agents: N/A 5. Neuropsych: This patient is not capable of making decisions on his own behalf. 7. Fluids/Electrolytes/Nutrition: Routine in and outs with follow-up chemistries on admit 8.  Chronic left foot wound.  Status post left foot incision and drainage debridement of fourth met head resection 10/11/2020.   -Follow-up per Dr. Cannon [facility] podiatry services as outpt -IV Ancef and Vibramycin course completed -continue daily dressing to foot -ordered Darco boot for gait/wb 9.  Systolic congestive heart failure.  Follow-up cardiology service.  Aldactone resumed 25 mg daily.  Monitor for any signs of fluid  overload             -daily weights   Filed Weights   10/20/20 0357 10/21/20 0536 10/22/20 0421  Weight: 64.1 kg 62.2 kg 61.7 kg    10.  Hypertension/nonsustained VT.  Lisinopril 5 mg daily, Imdur 60 mg daily, Toprol-XL 25 mg daily.  Monitor with increased mobility  6/28: hypotensive: decrease lisinopril to 2.60m  6/29 bp still soft, pt asymptomatic  6/3); d/c Lisinopril.  11.  RA.  Chronic prednisone.  We will discuss resuming Arava 12.  High-level autism.    13. Hypoalbuminemia: added Juven  14. Hypogmanesemia: check Magnesium level on Monday.  15. Disposition: HFU scheduled  LOS: 3 days A FACE TO FACE EVALUATION WAS PERFORMED  Jonathan [name] P Jonathan [name] 10/22/2020, 10:14 AM

## 2020-10-22 NOTE — Progress Notes (Signed)
Speech Language Pathology Daily Session Note  Patient Details  Name: Jonathan [name] MRN: [id number] Date of Birth: 26-Dec-1960  Today's Date: 10/22/2020 SLP Individual Time: 1345-1430 SLP Individual Time Calculation (min): 45 min  Short Term Goals: Week 1: SLP Short Term Goal 1 (Week 1): Pt will respond to basic yes/no questions with 80 % accuracy given mod A visual/verbal cues. SLP Short Term Goal 2 (Week 1): Pt will name common objects with 80% accuracy given supervision A semantic and sentence completion cues. SLP Short Term Goal 3 (Week 1): Pt will able to express at phrase/simple sentence level with 80% accuracy syntax/content with min A multimodal cues. SLP Short Term Goal 4 (Week 1): Pt will demonstrate self-monitoring of verbal errors with max A multimodal cues. SLP Short Term Goal 5 (Week 1): Pt will follow 2 step commands with min A verbal/visual cues. SLP Short Term Goal 6 (Week 1): Pt will demonstrate reading comprehension at phrase level matching to objects in a field of 3 with 80% accuracy and min semantic and sentence completion cues.  Skilled Therapeutic Interventions:   Patient seen for skilled ST session focusing on expressive and receptive language goals. Patient in bed but awake and alert and agreeable to therapy session. He named object photos with 80% accuracy and min-modA cues (semantic and partial word). He described object function for common objects with modA partial phrase cues for 70% accuracy. He answered open-ended biographical questions with 70% accuracy and modA cues due to perseveration and difficulty with transition between topics.He did demonstrate some awareness to phonemic and/or semantic errors when naming/describing. Overall, patient was delayed in responses and was perseverative but able to participate fully without difficulty. Patient continues to benefit from skilled SLP intervention to maximize cognitive-linguistic function prior to  discharge.  Pain Pain Assessment Pain Scale: Faces Faces Pain Scale: No hurt  Therapy/Group: Individual Therapy  Sonia Baller, MA, CCC-SLP Speech Therapy

## 2020-10-23 ENCOUNTER — Encounter: Payer: Self-pay | Admitting: Physical Medicine and Rehabilitation

## 2020-10-23 ENCOUNTER — Encounter (HOSPITAL_COMMUNITY): Payer: Medicare Other

## 2020-10-23 NOTE — Progress Notes (Signed)
Speech Language Pathology Daily Session Note  Patient Details  Name: Jonathan [name] MRN: [id number] Date of Birth: Dec 06, 1960  Today's Date: 10/23/2020 SLP Individual Time: 2831-5176 SLP Individual Time Calculation (min): 45 min  Short Term Goals: Week 1: SLP Short Term Goal 1 (Week 1): Pt will respond to basic yes/no questions with 80 % accuracy given mod A visual/verbal cues. SLP Short Term Goal 2 (Week 1): Pt will name common objects with 80% accuracy given supervision A semantic and sentence completion cues. SLP Short Term Goal 3 (Week 1): Pt will able to express at phrase/simple sentence level with 80% accuracy syntax/content with min A multimodal cues. SLP Short Term Goal 4 (Week 1): Pt will demonstrate self-monitoring of verbal errors with max A multimodal cues. SLP Short Term Goal 5 (Week 1): Pt will follow 2 step commands with min A verbal/visual cues. SLP Short Term Goal 6 (Week 1): Pt will demonstrate reading comprehension at phrase level matching to objects in a field of 3 with 80% accuracy and min semantic and sentence completion cues.  Skilled Therapeutic Interventions:   Patient seen for skilled ST session focusing on aphasia goals. He was able to name 5 different items in divergent naming task with categories of food and animals, generating first two items without cues but then requiring mod semantic and descriptive cues. He was able to point to word in field of three that corresponded with the object depicted in picture and was correct for 5/6. He named common every day objects with 100% accuracy and with intermittent cues, but less common objects, required min-modA cues (semantic cues). When responding to biographical yes/no questions, he did appears to be giving genuine responses, as he would not only respond with 'yeah'. He had significant difficulty when SLP asked him to name foods he liked or didn't like. 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 Patient continues to benefit from skilled SLP intervention to maximize cognitive-linguistic function prior to discharge.  Pain Pain Assessment Pain Scale: Faces Faces Pain Scale: No hurt  Therapy/Group: Individual Therapy  Sonia Baller, MA, CCC-SLP Speech Therapy

## 2020-10-23 NOTE — Progress Notes (Signed)
PROGRESS NOTE   Subjective/Complaints: No new complaints. Denies pain and indicated that he slept well   ROS: Limited due to cognitive/behavioral   Objective:   No results found. No results for input(s): WBC, HGB, HCT, PLT in the last 72 hours.  No results for input(s): NA, K, CL, CO2, GLUCOSE, BUN, CREATININE, CALCIUM in the last 72 hours.   Intake/Output Summary (Last 24 hours) at 10/23/2020 1056 Last data filed at 10/23/2020 0756 Gross per 24 hour  Intake 840 ml  Output --  Net 840 ml        Physical Exam: Vital Signs Blood pressure (!) 103/54, pulse 62, temperature 97.6 F (36.4 C), temperature source Oral, resp. rate 17, height 5' 3" (1.6 m), weight 57.6 kg, SpO2 100 %. Constitutional: No distress . Vital signs reviewed. HEENT: EOMI, oral membranes moist Neck: supple Cardiovascular: RRR without murmur. No JVD    Respiratory/Chest: CTA Bilaterally without wheezes or rales. Normal effort    GI/Abdomen: BS +, non-tender, non-distended Ext: no clubbing, cyanosis, or edema Psych: pleasant and cooperative Skin: intact Neuro: Musculoskeletal:  Skin:    General: Skin is warm.    Comments:   left foot with dressing, staples present, mild serosanguinous dressing. Skin remains very dry.   Neurological:    Mental Status: He is alert.    Comments: awake and alert. Follows commands. Dysconjugate gaze. Nystagmus with left lateral gaze more than right, right eye wanders with left gaze as well. P  Mild left lid ptosis.   Left facial weakness. Provides his name and age with some delay in processing and word finding.  Marland Kitchen RUE 4-/5. LUE 4+/5. RLE 3-4/5. LLE 3+/5 prox to distal, limited by wound on foot. Sensed pain in all 4's. Musculoskeletal:        General: No swelling or tenderness.    Cervical back: Normal range of motion.    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 Functional  deficits which require 3+ hours per day of interdisciplinary therapy in a comprehensive inpatient rehab setting. Physiatrist is providing close team supervision and 24 hour management of active medical problems listed below. Physiatrist and rehab team continue to assess barriers to discharge/monitor patient progress toward functional and medical goals  Care Tool:  Bathing    Body parts bathed by patient: Chest, Abdomen     Body parts n/a: Left lower leg   Bathing assist Assist Level: Moderate Assistance - Patient 50 - 74%     Upper Body Dressing/Undressing Upper body dressing   What is the patient wearing?: Pull over shirt    Upper body assist Assist Level: Maximal Assistance - Patient 25 - 49%    Lower Body Dressing/Undressing Lower body dressing      What is the patient wearing?: Incontinence brief     Lower body assist Assist for lower body dressing: Total Assistance - Patient < 25%     Toileting Toileting    Toileting assist Assist for toileting: Total Assistance - Patient < 25%     Transfers Chair/bed transfer  Transfers assist     Chair/bed transfer assist level: 2 Helpers  Locomotion Ambulation   Ambulation assist   Ambulation activity did not occur: Safety/medical concerns  Assist level: Minimal Assistance - Patient > 75% Assistive device: Walker-rolling Max distance: 5f   Walk 10 feet activity   Assist  Walk 10 feet activity did not occur: Safety/medical concerns  Assist level: Minimal Assistance - Patient > 75% Assistive device: Walker-rolling   Walk 50 feet activity   Assist Walk 50 feet with 2 turns activity did not occur: Safety/medical concerns         Walk 150 feet activity   Assist Walk 150 feet activity did not occur: Safety/medical concerns         Walk 10 feet on uneven surface  activity   Assist Walk 10 feet on uneven surfaces activity did not occur: Safety/medical concerns          Wheelchair     Assist Will patient use wheelchair at discharge?: No   Wheelchair activity did not occur: N/A         Wheelchair 50 feet with 2 turns activity    Assist    Wheelchair 50 feet with 2 turns activity did not occur: N/A       Wheelchair 150 feet activity     Assist  Wheelchair 150 feet activity did not occur: N/A       Blood pressure (!) 103/54, pulse 62, temperature 97.6 F (36.4 C), temperature source Oral, resp. rate 17, height 5' 3" (1.6 m), weight 57.6 kg, SpO2 100 %.    Medical Problem List and Plan: 1.  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  Impaired mobility, ambulating 40 feet -DVT/anticoagulation: Continue Lovenox             -antiplatelet therapy: Aspirin 81 mg daily and Plavix 75 mg day x3 weeks then Plavix alone.  Recommendations of 30-day cardiac event monitor 3. Diffuse muscle aches 2/2 RA: Neurontin 300 mg twice daily.   -using kpad for low/mid back pain, using tylenol also with benefit  -resumed Arava, added voltaren gel.  4. Mood: Provide emotional support             -antipsychotic agents: N/A 5. Neuropsych: This patient is not capable of making decisions on his own behalf. 7. Fluids/Electrolytes/Nutrition: Routine in and outs with follow-up chemistries on admit 8.  Chronic left foot wound.  Status post left foot incision and drainage debridement of fourth met head resection 10/11/2020.   -Follow-up per Dr. [facility] as outpt -IV Ancef and Vibramycin course completed -continue daily dressing to foot -ordered Darco boot for gait/wb 9.  Systolic congestive heart  failure.  Follow-up cardiology service.  Aldactone resumed 25 mg daily.  Monitor for any signs of fluid overload             -weights trending down but I's/O's +, eating 100%---question accuracy   Filed Weights   10/21/20 0536 10/22/20 0421 10/23/20 0451  Weight: 62.2 kg 61.7 kg 57.6 kg    10.  Hypertension/nonsustained VT.  Lisinopril 5 mg daily, Imdur 60  mg daily, Toprol-XL 25 mg daily.  Monitor with increased mobility  6/28: hypotensive: decrease lisinopril to 2.5mg  6/29 bp still soft, pt asymptomatic  6/30: d/c 'ed Lisinopril---bpt soft/stable 11.  RA.  Chronic prednisone.  resumed Arava 12.  High-level autism.    13. Hypoalbuminemia: added Juven  14. Hypogmanesemia: check Magnesium level on Monday.  15. Disposition: HFU scheduled  LOS: 4 days A FACE TO FACE EVALUATION WAS PERFORMED  Jonathan [name] 10/23/2020, 10:56 AM     

## 2020-10-23 NOTE — Progress Notes (Signed)
Physical Therapy Session Note  Patient Details  Name: Jonathan [name] MRN: [id number] Date of Birth: 1960-12-27  Today's Date: 10/23/2020 PT Individual Time: 0900-1000 PT Individual Time Calculation (min): 60 min   Short Term Goals: Week 1:  PT Short Term Goal 1 (Week 1): Pt will complete bed mobility with minA PT Short Term Goal 2 (Week 1): Pt will complete sit<>stand transfer with modA and LRAD PT Short Term Goal 3 (Week 1): Pt will complete bed<>chair transfer with modA and LRAD PT Short Term Goal 4 (Week 1): Pt will ambulate 73ft with modA and LRAD  Skilled Therapeutic Interventions/Progress Updates:    Pt greeted supine in bed to start session. Reports 4/10 BLE aches/pains, likely related to chronic RA. No c/o pain during session. Donned pants with maxA while supine in bed. Able to roll in bed with supervision to pull pants over hips. Supine<>sit completed with CGA with HOB flat. Donned shoe to R foot and DARCO boot to L foot with totalA. Completed sit<>stand from RAISED EOB with minA and required min/modA for balance while for stand/step transfer to w/c. Wheeled to main rehab gym for time management. Completed stand<>pivot transfer from w/c to mat table, requiring modA for powering to rise and CGA for balance as he pivoted to mat table with RW - good safety awareness noted. Gait training x72 ft with CGA/minA and RW on level surfaces - demo's short shuffling steps bilaterally with decreased step length R>L, cues for correcting gait deficits. Attempted to initiate stair training but pt unable to step up onto the 1st step due to difficulty bringing R foot up to the step. Pt reports he he required "some help" at home to enter his house and did not go out of the house much - will need to confirm baseline assist level with family. Pt returned to his room in w/c at end of session and agreeable to continue to sit in w/c for upcoming OT session. Safety belt alarm on and all needs within reach.    Therapy Documentation Precautions:  Precautions Precautions: Fall Required Braces or Orthoses: Other Brace Other Brace: post op shoe Restrictions Weight Bearing Restrictions: No LLE Weight Bearing: Weight bearing as tolerated Other Position/Activity Restrictions: in post-op shoe General:     Therapy/Group: Individual Therapy  Alger Simons 10/23/2020, 7:27 AM

## 2020-10-23 NOTE — Progress Notes (Signed)
Occupational Therapy Session Note  Patient Details  Name: Jonathan [name] MRN: [id number] Date of Birth: 1960-11-04  Today's Date: 10/23/2020 OT Individual Time: 7342-8768 OT Individual Time Calculation (min): 41 min    Short Term Goals: Week 1:  OT Short Term Goal 1 (Week 1): Pt will self feed using AE as needed with max assist OT Short Term Goal 2 (Week 1): Pt will complete toilet transfer with mod assist OT Short Term Goal 3 (Week 1): Pt will complete toileting with max assist  Skilled Therapeutic Interventions/Progress Updates:    Pt received in room in w/c and consented to OT tx. Pt seen for functional STS training, standing tolerance, balance, and [place] tasks this day to increase independence and safety with ADLs and functional transfers. Pt initially instructed in STS in RW with mod A, then taken to elevated table to complete [facility] tasks while standing and matching pegs/pegboard to reference card. Pt req min cuing for accuracy and to use both hands during task. Pt required multiple seated rest breaks during task due to fatigue and required mod A to power up during each stand. Once standing, pt able to maintain upright position with CGA and table top support. After tx, pt helped back to room, left with seatbelt alarm on and all needs met.  Therapy Documentation Precautions:  Precautions Precautions: Fall Required Braces or Orthoses: Other Brace Other Brace: post op shoe Restrictions Weight Bearing Restrictions: No LLE Weight Bearing: Weight bearing as tolerated Other Position/Activity Restrictions: in post-op shoe  Vital Signs: Therapy Vitals Temp: 97.6 F (36.4 C) Temp Source: Oral Pulse Rate: 62 Resp: 17 BP: (!) 103/54 Patient Position (if appropriate): Lying Oxygen Therapy SpO2: 100 % O2 Device: Room Air Pain:   0/10   Therapy/Group: Individual Therapy  Iden Stripling 10/23/2020, 7:57 AM

## 2020-10-23 NOTE — Progress Notes (Signed)
Physical Therapy Session Note  Patient Details  Name: Jonathan [name] MRN: [id number] Date of Birth: 1960/10/22  Today's Date: 10/23/2020 PT Individual Time: 1859-0931 PT Individual Time Calculation (min): 44 min   Short Term Goals: Week 1:  PT Short Term Goal 1 (Week 1): Pt will complete bed mobility with minA PT Short Term Goal 2 (Week 1): Pt will complete sit<>stand transfer with modA and LRAD PT Short Term Goal 3 (Week 1): Pt will complete bed<>chair transfer with modA and LRAD PT Short Term Goal 4 (Week 1): Pt will ambulate 21f with modA and LRAD  Skilled Therapeutic Interventions/Progress Updates:    Pt received sitting in w/c and agreeable to PT tx. No reports of pain. When asked if he needed to use bathroom before leaving his room, he reported he did not. Wheeled to [facility] time management. Unfortunately, once we arrived he reported an episode of urine incontinence. Returned back to his room in his w/c. Sit<>stand to RW with modA for powering to rise and minA for balance. Ambulated ~141fwith RW and CGA into his bathroom and required totalA for managing pants/brief in standing. Brief heavily saturated with urine. He required maxA for frontal pericare and totalA for posterior pericare - pt reports his home health aid does this for him. Donned new clean brief and pants with totalA. Ambulated back to his w/c with CGA and RW, ~1525fcues for increasing bilateral step length and safety with RW. Wheeled back to day room rehab gym and performed gait training [id number] CGA and RW - similar cues as above with emphasis on increasing step length/height and improving gait speed (gait speed <0.2 m/s). Completed 4 minutes of Nustep at workload 2, using BLE's only, focusing on AAROM for hip/knee joints and cardiovascular endurance. Pt needing a few brief rest breaks to complete. He ended session seated in w/c in SLP room for upcoming SLP session - handoff of care to SLP. All needs met.  Pt is making great progress towards goals.   Therapy Documentation Precautions:  Precautions Precautions: Fall Required Braces or Orthoses: Other Brace Other Brace: post op shoe Restrictions Weight Bearing Restrictions: No LLE Weight Bearing: Weight bearing as tolerated Other Position/Activity Restrictions: in post-op shoe General:    Therapy/Group: Individual Therapy  Aalivia Mcgraw P Yelina Sarratt 10/23/2020, 2:01 PM

## 2020-10-23 NOTE — Progress Notes (Signed)
Occupational Therapy Session Note  Patient Details  Name: Jonathan [name] MRN: [id number] Date of Birth: 1960-10-06  Today's Date: 10/23/2020 OT Individual Time: 1015-1100 OT Individual Time Calculation (min): 45 min    Short Term Goals: Week 1:  OT Short Term Goal 1 (Week 1): Pt will self feed using AE as needed with max assist OT Short Term Goal 2 (Week 1): Pt will complete toilet transfer with mod assist OT Short Term Goal 3 (Week 1): Pt will complete toileting with max assist  Skilled Therapeutic Interventions/Progress Updates:    Pt sitting up in w/c c/o 4/10 pain in bilateral hands and reports he does not need to toilet at this time.  Timed toileting clock notated. Pt reports he likes extended straw and does make it easier to self feed fluids. Pt sititng with only standard call bell in reach.  Re-educated nursing regarding pt needing soft call bell due to laxity of digits in bilateral hands.  Trial of various AE for self feeding completed today including right angled spoon and fork, universal hand cuff, and built up foam handle.  Pt exhibited most ease and independence using built up foam handle compared to other AE to self feed soft pear chunks and apple sauce. Therapist applied light coban wrap to PIP and DIP joints of right thumb, index, and middle finger and pt self fed using built up handle with decreased pain and increased stability/dexterity noted of right digits.  Pt reports improved comfort as well.  Instructed SPT to doff coban at next session.  Soft call bell in reach, seat alarm on.  Therapy Documentation Precautions:  Precautions Precautions: Fall Required Braces or Orthoses: Other Brace Other Brace: post op shoe Restrictions Weight Bearing Restrictions: No LLE Weight Bearing: Weight bearing as tolerated Other Position/Activity Restrictions: in post-op shoe    Therapy/Group: Individual Therapy  Ezekiel Slocumb 10/23/2020, 12:37 PM

## 2020-10-24 NOTE — Progress Notes (Signed)
Occupational Therapy Session Note  Patient Details  Name: Jonathan [name] MRN: [id number] Date of Birth: [birth year]  Today's Date: 10/24/2020 OT Individual Time: 7255-0016 OT Individual Time Calculation (min): 60 min    Short Term Goals: Week 1:  OT Short Term Goal 1 (Week 1): Pt will self feed using AE as needed with max assist OT Short Term Goal 2 (Week 1): Pt will complete toilet transfer with mod assist OT Short Term Goal 3 (Week 1): Pt will complete toileting with max assist  Skilled Therapeutic Interventions/Progress Updates:    Pt received supine, sleeping, easily awoken and agreeable to eating breakfast. He required slower cues for processing commands. Min A to come EOB. Pt was set up for breakfast with built up handle on fork. Pt required mod cueing and mod A to position built up fork in his R hand and to load fork. He had difficulty with accuracy in loading and often required self-provided min-mod guiding /steadying assist for wrist and hand stability to bring fork to mouth. He compensated with shoulder abduction and excessive trunk extension. Extended straw was also useful in managing cups. R hand PIP/DIP/MCP joint laxity causes a gross palmar grasp to be most affective, as well as a pronated gross palmer when loading utensils. Introduced u-cuff and wrist cock up u-cuff alternative with demonstration re use to trial. Pt was very successful in using standard u-cuff, requiring only occasional min stabilization to load utensil. Pt completed UB dressing with max A, he was very self limiting in what he was willing to try and do with cueing. Darco shoe donned total A. Sit > stand with mod A for total A peri hygiene. Brief soaked with urine. Pt was left supine with all needs met, bed alarm set.   Therapy Documentation Precautions:  Precautions Precautions: Fall Required Braces or Orthoses: Other Brace Other Brace: post op shoe Restrictions Weight Bearing Restrictions: No LLE Weight  Bearing: Weight bearing as tolerated Other Position/Activity Restrictions: in post-op shoe   Therapy/Group: Individual Therapy  Curtis Sites 10/24/2020, 7:24 AM

## 2020-10-24 NOTE — Plan of Care (Signed)
  Problem: RH BOWEL ELIMINATION Goal: RH STG MANAGE BOWEL WITH ASSISTANCE Description: STG Manage Bowel with mod I Assistance. Outcome: Not Progressing; LBM 6/30   Problem: RH BLADDER ELIMINATION Goal: RH STG MANAGE BLADDER WITH ASSISTANCE Description: STG Manage Bladder With  mod I Assistance Outcome: Not Progressing; incontinent

## 2020-10-24 NOTE — Progress Notes (Signed)
Speech Language Pathology Daily Session Note  Patient Details  Name: Jonathan [name] MRN: [id number] Date of Birth: 03/23/1961  Today's Date: 10/24/2020 SLP Individual Time: 0867-6195 SLP Individual Time Calculation (min): 45 min  Short Term Goals: Week 1: SLP Short Term Goal 1 (Week 1): Pt will respond to basic yes/no questions with 80 % accuracy given mod A visual/verbal cues. SLP Short Term Goal 2 (Week 1): Pt will name common objects with 80% accuracy given supervision A semantic and sentence completion cues. SLP Short Term Goal 3 (Week 1): Pt will able to express at phrase/simple sentence level with 80% accuracy syntax/content with min A multimodal cues. SLP Short Term Goal 4 (Week 1): Pt will demonstrate self-monitoring of verbal errors with max A multimodal cues. SLP Short Term Goal 5 (Week 1): Pt will follow 2 step commands with min A verbal/visual cues. SLP Short Term Goal 6 (Week 1): Pt will demonstrate reading comprehension at phrase level matching to objects in a field of 3 with 80% accuracy and min semantic and sentence completion cues.  Skilled Therapeutic Interventions:   Patient seen for skilled ST session focusing on aphasia goals. Patient in bed, awake and alert throughout session. He was able to name objects that fit in given category (things you find in fridge, etc) with minA cues for 4/10 and modA cues for the other 6. He benefited from semantic cues and repeating of instructions due to perseveration on previous items he named. He described cookie theft picture with noted semantic paraphasias and minA cues to initiate and elaborate on responses. Patient continues to demonstrate inconsistent awareness to semantic errors. He continues to benefit from skilled SLP intervention to maximize cognitive-linguistic goals prior to discharge.  Pain Pain Assessment Pain Scale: 0-10 Pain Score: 0-No pain Faces Pain Scale: No hurt Pain Type: Acute pain Pain Location: Foot Pain  Orientation: Left Pain Descriptors / Indicators: Aching Pain Frequency: Intermittent Pain Onset: On-going Pain Intervention(s): Medication (See eMAR)  Therapy/Group: Individual Therapy  Sonia Baller, MA, CCC-SLP Speech Therapy

## 2020-10-24 NOTE — Progress Notes (Signed)
PROGRESS NOTE   Subjective/Complaints: No new complaints. Sleeping when I arrived  ROS: Patient denies fever, rash, sore throat, blurred vision, nausea, vomiting, diarrhea, cough, shortness of breath or chest pain, joint or back pain, headache, or mood change.   Objective:   No results found. No results for input(s): WBC, HGB, HCT, PLT in the last 72 hours.  No results for input(s): NA, K, CL, CO2, GLUCOSE, BUN, CREATININE, CALCIUM in the last 72 hours.   Intake/Output Summary (Last 24 hours) at 10/24/2020 1005 Last data filed at 10/24/2020 2585 Gross per 24 hour  Intake 820 ml  Output --  Net 820 ml        Physical Exam: Vital Signs Blood pressure (!) 102/55, pulse 63, temperature 98 F (36.7 C), temperature source Oral, resp. rate 17, height _0  (1.6 m), weight 57.2 kg, SpO2 96 %. Constitutional: No distress . Vital signs reviewed. HEENT: EOMI, oral membranes moist Neck: supple Cardiovascular: RRR without murmur. No JVD    Respiratory/Chest: CTA Bilaterally without wheezes or rales. Normal effort    GI/Abdomen: BS +, non-tender, non-distended Ext: no clubbing, cyanosis, or edema Psych: pleasant and cooperative  Skin: intact Neuro: Musculoskeletal:  Skin:    General: Skin is warm.    Comments:   left foot with dressing, staples present, mild serosanguinous dressing. Skin remains very dry.   Neurological:    Mental Status: He is alert.    Comments: awake and alert. Follows commands. Dysconjugate gaze. Nystagmus with left lateral gaze more than right, right eye wanders with left gaze as well. P  Mild left lid ptosis.   Left facial weakness. Provides his name and age with some delay in processing and word finding.  Marland Kitchen RUE 4-/5. LUE 4+/5. RLE 3-4/5. LLE 3+/5 prox to distal, limited by wound on foot. Sensed pain in all 4's.---neuro exam stable 7/2 Musculoskeletal:        General: No swelling or tenderness.    Cervical  back: Normal range of motion.    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 Functional deficits which require 3+ hours per day of interdisciplinary therapy in a comprehensive inpatient rehab setting. Physiatrist is providing close team supervision and 24 hour management of active medical problems listed below. Physiatrist and rehab team continue to assess barriers to discharge/monitor patient progress toward functional and medical goals  Care Tool:  Bathing    Body parts bathed by patient: Chest, Abdomen     Body parts n/a: Left lower leg   Bathing assist Assist Level: Moderate Assistance - Patient 50 - 74%     Upper Body Dressing/Undressing Upper body dressing   What is the patient wearing?: Pull over shirt    Upper body assist Assist Level: Maximal Assistance - Patient 25 - 49%    Lower Body Dressing/Undressing Lower body dressing      What is the patient wearing?: Incontinence brief     Lower body assist Assist for lower body dressing: Total Assistance - Patient < 25%     Toileting Toileting    Toileting assist Assist for toileting: Total Assistance - Patient < 25%  Transfers Chair/bed transfer  Transfers assist     Chair/bed transfer assist level: Moderate Assistance - Patient 50 - 74%     Locomotion Ambulation   Ambulation assist   Ambulation activity did not occur: Safety/medical concerns  Assist level: Minimal Assistance - Patient > 75% Assistive device: Walker-rolling Max distance: 36f   Walk 10 feet activity   Assist  Walk 10 feet activity did not occur: Safety/medical concerns  Assist level: Minimal Assistance - Patient > 75% Assistive device: Walker-rolling   Walk 50 feet activity   Assist Walk 50 feet with 2 turns activity did not occur: Safety/medical concerns  Assist level: Minimal Assistance - Patient > 75% Assistive device: Walker-rolling    Walk 150 feet activity   Assist  Walk 150 feet activity did not occur: Safety/medical concerns         Walk 10 feet on uneven surface  activity   Assist Walk 10 feet on uneven surfaces activity did not occur: Safety/medical concerns         Wheelchair     Assist Will patient use wheelchair at discharge?: No   Wheelchair activity did not occur: N/A         Wheelchair 50 feet with 2 turns activity    Assist    Wheelchair 50 feet with 2 turns activity did not occur: N/A       Wheelchair 150 feet activity     Assist  Wheelchair 150 feet activity did not occur: N/A       Blood pressure (!) 102/55, pulse 63, temperature 98 F (36.7 C), temperature source Oral, resp. rate 17, height _0  (1.6 m), weight 57.2 kg, SpO2 96 %.    Medical Problem List and Plan: 1.  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  Impaired mobility, ambulating 40 feet -DVT/anticoagulation: Continue Lovenox             -antiplatelet therapy: Aspirin 81 mg daily and Plavix 75 mg day x3 weeks then Plavix alone.  Recommendations of 30-day cardiac event monitor 3. Diffuse muscle aches 2/2 RA: Neurontin 300 mg twice daily.   -using kpad for low/mid back pain, using tylenol also with benefit  -resumed Arava, added voltaren gel.  4. Mood: Provide emotional support             -antipsychotic agents: N/A 5. Neuropsych: This patient is not capable of making decisions on his own behalf. 7. Fluids/Electrolytes/Nutrition: Routine in and outs with follow-up chemistries on admit 8.  Chronic left foot wound.  Status post left foot incision and drainage debridement of fourth met head  resection 10/11/2020.   -Follow-up per Dr. SCannon [facility] as outpt -IV Ancef and Vibramycin course completed -continue daily dressing to foot -  Darco boot for gait/wb 9.  Systolic congestive heart failure.  Follow-up cardiology service.  Aldactone resumed 25 mg daily.  Monitor for any signs of fluid overload             -weights trending down but I's/O's +,  eating 100%---question accuracy  Filed Weights   10/22/20 0421 10/23/20 0451 10/24/20 0350  Weight: 61.7 kg 57.6 kg 57.2 kg    10.  Hypertension/nonsustained VT.  Lisinopril 5 mg daily, Imdur 60 mg daily, Toprol-XL 25 mg daily.  Monitor with increased mobility  6/28: hypotensive: decrease lisinopril to 2.48m  6/29 bp still soft, pt asymptomatic  6/30: d/c 'ed Lisinopril---bpt soft/stable, asymptomatic 11.  RA.  Chronic prednisone.  resumed Arava 12.  High-level autism.    13. Hypoalbuminemia: added Juven  14. Hypogmanesemia: improved    LOS: 5 days A FACE TO FACE EVALUATION WAS PERFORMED  ZMeredith Staggers7/05/2020, 10:05 AM

## 2020-10-24 NOTE — Progress Notes (Signed)
Physical Therapy Session Note  Patient Details  Name: Jonathan [name] MRN: [id number] Date of Birth: Jul 21, 1960  Today's Date: 10/24/2020 PT Individual Time: 1120-1200 + [id number] PT Individual Time Calculation (min): 40 min  + 42 min  Short Term Goals: Week 1:  PT Short Term Goal 1 (Week 1): Pt will complete bed mobility with minA PT Short Term Goal 2 (Week 1): Pt will complete sit<>stand transfer with modA and LRAD PT Short Term Goal 3 (Week 1): Pt will complete bed<>chair transfer with modA and LRAD PT Short Term Goal 4 (Week 1): Pt will ambulate 82f with modA and LRAD  Skilled Therapeutic Interventions/Progress Updates:     1st session: Pt received supine in bed, sleeping soundly on arrival and awakens to voice. He's agreeable to PT session but needed time to "wake up." Pt noted to be incontinent of urine on arrival. Required totalA for brief change but he was able to roll in bed with supervision. He was able to perform frontal pericare with setupA but needed minA for thoroughness. Retrieved disposable pants from closet as pt reports having no more clean pants. Donned these while supine in bed with maxA and completed via rolling technique. Completed supine<>sit with supervision with use of bed features. From raised EOB, required minA for powering to rise to RW and required CGA for balance as he turned to sit to w/c - noted increased difficulty raising his R foot for stepping techniques, reports being "stiff." Donned R shoe and L DARCO shoe with totalA as he sat EOB. Noted mildly saturated dressing from L foot and RN made aware during session. Wheeled to ortho gym in w/c for time management and completed stand<>pivot transfer with modA for powering to rise from w/c and CGA for balance as he transferred to Nustep. He completed a total of 7 minutes at workload 2, using BLE's only, focusing on self AAROM for joint stiffness (chronic RA), BLE strengthening, and global endurance training. He  required a few brief rest breaks in order to complete the total 7 minutes and was able to maintain a cadence of ~70 steps/minute which is an improvement from prior sessions. Stand<>pivot transfer with modA for powering to rise and minA for balance with RW to w/c. Returned to his room and remained seated in w/c at end of session. Safety belt alarm on, soft call bell in reach, all needs met.  2nd session: Pt finishing up his lunch on arrival. He's using built-up spoon as AE to feed - difficulty bringing hand to mouth due to chronic shoulder limitations with excessive shoulder elevation, external rotation, and thoracic extension for compensatory strategy. U-cuff sitting next to tray but pt had finished his meal before PT was able to donn. NT was at bedside providing full supervision for his meal and educated NT on how to donn u-cuff. Pt also used long straw as AE for drinking his sprite. Pt wheeled to main rehab gym for time management. Sit<>stand from w/c height to RW requiring modA for powering to rise. Gait training ~265fwith CGA and RW - antalgic gait due to L foot pain (pt reports 7/10 pain), decreased L weight shift resulting in poor R step length/height. No LOB or knee buckling and gait speed <0.2 m/s. Pt defer's further gait trials due to this L foot pain but was agreeable to seated there-ex. Completed the following: -1x15 LAQ bilaterally with 4# ankle weight -1x15 hip marches bilaterally with 4# ankle weight -hamstring  & adductor stretches to tolerance with gentle  overpressure  Pt returned to his room in w/c for time management. NT requested pt return to bed at end of session to allow brief change however pt requesting to remain seated in the w/c. Assess brief and pt clean. Pt remained seated in w/c with soft call bell in reach and safety belt alarm applied. RN notified at end of session regarding pt's L foot pain (7/10) that was limiting his ability to functionally participate this  session.  Therapy Documentation Precautions:  Precautions Precautions: Fall Required Braces or Orthoses: Other Brace Other Brace: post op shoe Restrictions Weight Bearing Restrictions: No LLE Weight Bearing: Weight bearing as tolerated Other Position/Activity Restrictions: in post-op shoe General:    Therapy/Group: Individual Therapy  Alger Simons 10/24/2020, 7:35 AM

## 2020-10-25 NOTE — Plan of Care (Signed)
  Problem: RH BLADDER ELIMINATION Goal: RH STG MANAGE BLADDER WITH ASSISTANCE Description: STG Manage Bladder With mod I  Assistance Outcome: Not Progressing; incontinence   

## 2020-10-26 DIAGNOSIS — I4729 Other ventricular tachycardia: Secondary | ICD-10-CM | POA: Insufficient documentation

## 2020-10-26 LAB — BASIC METABOLIC PANEL
Anion gap: 6 (ref 5–15)
BUN: 17 mg/dL (ref 6–20)
CO2: 24 mmol/L (ref 22–32)
Calcium: 9.6 mg/dL (ref 8.9–10.3)
Chloride: 105 mmol/L (ref 98–111)
Creatinine, Ser: 0.57 mg/dL — ABNORMAL LOW (ref 0.61–1.24)
GFR, Estimated: >60 mL/min (ref >60)
Glucose, Bld: 97 mg/dL (ref 70–99)
Potassium: 3.9 mmol/L (ref 3.5–5.1)
Sodium: 135 mmol/L (ref 135–145)

## 2020-10-26 LAB — IRON AND TIBC
Iron: 26 ug/dL — ABNORMAL LOW (ref 45–182)
Saturation Ratios: 11 % — ABNORMAL LOW (ref 17.9–39.5)
TIBC: 230 ug/dL — ABNORMAL LOW (ref 250–450)
UIBC: 204 ug/dL

## 2020-10-26 LAB — VITAMIN D 25 HYDROXY (VIT D DEFICIENCY, FRACTURES): Vit D, 25-Hydroxy: 18.96 ng/mL — ABNORMAL LOW (ref 30–100)

## 2020-10-26 LAB — MAGNESIUM: Magnesium: 1.6 mg/dL — ABNORMAL LOW (ref 1.7–2.4)

## 2020-10-26 MED ORDER — GABAPENTIN 300 MG PO CAPS
300.0000 mg | ORAL_CAPSULE | Freq: Three times a day (TID) | ORAL | Status: DC
  Administered 2020-10-26 – 2020-10-28 (×7): 300 mg via ORAL
  Filled 2020-10-26 (×7): qty 1

## 2020-10-26 MED ORDER — SPIRONOLACTONE 12.5 MG HALF TABLET
12.5000 mg | ORAL_TABLET | Freq: Every day | ORAL | Status: DC
  Administered 2020-10-27 – 2020-10-30 (×4): 12.5 mg via ORAL
  Filled 2020-10-26 (×4): qty 1

## 2020-10-26 MED ORDER — MAGNESIUM SULFATE 2 GM/50ML IV SOLN
2.0000 g | Freq: Once | INTRAVENOUS | Status: AC
  Administered 2020-10-26: 2 g via INTRAVENOUS
  Filled 2020-10-26: qty 50

## 2020-10-26 NOTE — Progress Notes (Signed)
Left surgical wound appearing to have changed in size since last week. Green drainage noted. MD Raulker notified.  Jonathan Stack, LPN

## 2020-10-26 NOTE — Progress Notes (Signed)
Physical Therapy Weekly Progress Note  Patient Details  Name: Jonathan [name] MRN: [id number] Date of Birth: 07/30/1960  Beginning of progress report period: October 20, 2020 End of progress report period: October 26, 2020  Today's Date: 10/26/2020 PT Individual Time: 0800-0900 + [id number]  Skilled Intervention: 60 min  + 75 min  Patient has met 4 of 4 short term goals. Pt is making steady progress towards his LTG. He's demonstrated ability to improve bed mobility to minA, static sitting balance with supervision, sit<>stand transfers to a RW with modA (for powering up to rise) and stand<>pivot transfers with CGA and RW. He has been able to ambulate ~43f with CGA and RW although gait impairments include short shuffling steps, decreased LLE weight shift, antalgic gait due to L foot (I&D of 4th met head resection). We have attempted to initiate stair training but due to LLE pain and RLE weakness, pt has not been able to go up 1 step. Will continue to progress as appropriate.  Patient continues to demonstrate the following deficits muscle weakness and muscle joint tightness, decreased cardiorespiratoy endurance, decreased coordination and decreased motor planning, decreased visual perceptual skills and decreased visual motor skills, decreased initiation, decreased attention, decreased awareness, decreased problem solving, decreased safety awareness, decreased memory, and delayed processing, and decreased sitting balance, decreased standing balance, decreased postural control, and decreased balance strategies and therefore will continue to benefit from skilled PT intervention to increase functional independence with mobility.  Patient progressing toward long term goals.  Continue plan of care.  PT Short Term Goals Week 1:  PT Short Term Goal 1 (Week 1): Pt will complete bed mobility with minA PT Short Term Goal 1 - Progress (Week 1): Met PT Short Term Goal 2 (Week 1): Pt will complete sit<>stand transfer  with modA and LRAD PT Short Term Goal 2 - Progress (Week 1): Met PT Short Term Goal 3 (Week 1): Pt will complete bed<>chair transfer with modA and LRAD PT Short Term Goal 3 - Progress (Week 1): Met PT Short Term Goal 4 (Week 1): Pt will ambulate 221fwith modA and LRAD PT Short Term Goal 4 - Progress (Week 1): Met Week 2:  PT Short Term Goal 1 (Week 2): STG = LTG due to ELOS  Skilled Therapeutic Interventions/Progress Updates:     1st session: Pt received supine in bed to start session and is agreeable to PT tx. No reports of pain. RN and MD at bedside for morning medications and morning rounds. MD requesting to hold occlusion glasses, questioning double vision - glasses off throughout session to assess functional visual deficits. Pt noted to be incontinent of bladder and bowel on arrival. Required totalA for pericare and brief change. He was able to roll in bed with supervision with HOB flat and use of bed rails. Retrieved disposable pants from clean utility closet. Pt completed supine<>sit with supervision with HOB flat. Threaded pants with totalA while he sat EOB and donned L DARCO shoe and R croc shoe with totalA. From raised EOB, he completed sit<>stand with minA to RW and required totalA for pulling pants over hips in standing. Completed stand<>pivot transfer with CGA for balance and RW from EOB to w/c - demo's very short shuffling steps and decreased LLE weight bearing with difficulty lifting RLE, but capable with time provided. Pt noted to have a strong odor, unable to verbalize his last bath. Doffed dirty shirt with maxA and retrieved a new disposable one. Provided soaped washcloth was pt was instructed to  cleanse his upper body while seated in w/c. He required maxA for thoroughness due to proximal shoulder weakness/contractures. Washed back and underarms with totalA and provided deodorant as well. Donned disposable shirt with maxA. Wheeled pt sinkside and provided oral rinse to provide  appropriate oral hygiene (pt has no teeth and no dentures in the hopsital). Next, provided totalA for facial soaped washcloth (again, pt unable to reach shoulders above >90deg to wash his face). He ended session seated in w/c with safety belt alarm on and all needs in reach.  2nd session: Pt received sitting in w/c, agreeable to PT session. No reports of pain. Wheeled for time management to ortho rehab gym for [employer] (July 4th). Pt participated in various therapeutic activities such as corn hole toss, painting rocks, and coloring book. Pt required modA for sit<>stand to RW and CGA for balance as he completed corn hole toss with his RUE - poor ability to produce power behind throws and was able to toss the bag ~2-3 feet in front of him - worked on cognitive processing with instruction on tallying up points and counting bags - able to count bags appropriately but needed assist for addition. Next, worked on painting rocks in [employer] (blue, red) and did this in standing with CGA and RW for balance - limited standing tolerance due to fatigue - completed this activity while seated in chair. Worked on [facility] with paint brush and manipulating object with pincer vs palmar grasping. His last session with coloring worked on accuracy and [facility] to improve carryover into functional tasks - also worked on sustained and focused attention with distractions from rehab gym environment - able to hold appropriate attention and color within lines appropriately as his vision deficits appeared not to affect this significantly. He required maxA assist  for powering to rise to stand from low siting chair without arm rests with cues provided for forward weight shift and forward scooting to prepare. He ambulated a total of 72f ft + 815fwith RW and CGA during session - gait continues to be antalgic due to L foot pain with short shuffling steps, step-to gait pattern. Cues throughout for corrections and normalizing gait pattern.  Finished session with seated there-ex, completing 2x15 sets of LAQ + hip marches (RLE & LLE) with 4lb ankle weight attached bilaterally. Pt wheeled back to room and remained seated in w/c with safety belt alarm in place and all needs in reach.   Therapy Documentation Precautions:  Precautions Precautions: Fall Required Braces or Orthoses: Other Brace Other Brace: post op shoe Restrictions Weight Bearing Restrictions: No LLE Weight Bearing: Weight bearing as tolerated Other Position/Activity Restrictions: in post-op shoe General:    Therapy/Group: Individual Therapy  Ellarie Picking P Akane Tessier PT 10/26/2020, 7:40 AM

## 2020-10-26 NOTE — Plan of Care (Signed)
  Problem: RH Stairs Goal: LTG Patient will ambulate up and down stairs w/assist (PT) Description: LTG: Patient will ambulate up and down # of stairs with assistance (PT) Outcome: Not Applicable Note: DC goal - anticipate pt will be unable to perform 5 steps at discharge due to L foot pain and RLE weakness. Will require medical transport home which CSW approved.

## 2020-10-26 NOTE — Progress Notes (Signed)
Occupational Therapy Session Note  Patient Details  Name: Jonathan [name] MRN: [id number] Date of Birth: Aug 06, 1960  Today's Date: 10/26/2020 OT Individual Time: 1000-1100 OT Individual Time Calculation (min): 60 min    Short Term Goals: Week 1:  OT Short Term Goal 1 (Week 1): Pt will self feed using AE as needed with max assist OT Short Term Goal 2 (Week 1): Pt will complete toilet transfer with mod assist OT Short Term Goal 3 (Week 1): Pt will complete toileting with max assist  Skilled Therapeutic Interventions/Progress Updates:    Pt sitting up in w/c, reports no pain today.  Also states he does not have to go to the bathroom therefore simulation only toileting completed in order to facilitate increased independence.  Applied coban wrap to right thumb (including cmc joint), index, and middle finger and left index finger in order to increase support for functional gripping due to joint laxity. Pt completed sit to stand at RW from w/c needing mod assist for initial power up then CGA to ambulate to bathroom.  Min assist stand<> sit at 3 in 1 commode.  All sit to stands required increased time and multimodal cues to increased forward weight shifting.  Pt able to pull pants down and then back up over hips with min assist.  Pt ambulated using RW back to w/c placed at bedside with CGA.  Stand to sit with min assist with verbal cues to reduce rapid descent.  Pt transported outside for fresh air per pt request.  Pt participated in blocked practice sit<>stand and static standing with bilateral hand release from RW with min assist for initial power up and lowering to seat and CGA for standing balance.  Pt returned to room, call bell in reach, seat alarm on.  Educated [employer] certified [employer] on donning/doffing of U-cuff to pts right hand for self feeding.    Therapy Documentation Precautions:  Precautions Precautions: Fall Required Braces or Orthoses: Other Brace Other Brace: post op shoe Restrictions Weight  Bearing Restrictions: No LLE Weight Bearing: Weight bearing as tolerated Other Position/Activity Restrictions: in post-op shoe    Therapy/Group: Individual Therapy  Jonathan [name] 10/26/2020, 12:31 PM

## 2020-10-26 NOTE — Progress Notes (Signed)
PROGRESS NOTE   Subjective/Complaints: Poor historian, ,Discussed vision issues with PT, pt states his left eye has "been like this a long time" Cannot say if he used crutches or walker PTA (says yes to both )   ROS: Patient denies CP, SOB, N/V/D.   Objective:   No results found. No results for input(s): WBC, HGB, HCT, PLT in the last 72 hours.  Recent Labs    10/26/20 0546  NA 135  K 3.9  CL 105  CO2 24  GLUCOSE 97  BUN 17  CREATININE 0.57*  CALCIUM 9.6     Intake/Output Summary (Last 24 hours) at 10/26/2020 0802 Last data filed at 10/25/2020 1800 Gross per 24 hour  Intake 240 ml  Output --  Net 240 ml         Physical Exam: Vital Signs Blood pressure 97/62, pulse 81, temperature 98.4 F (36.9 C), temperature source Oral, resp. rate 18, height 5' 3" (1.6 m), weight 57.9 kg, SpO2 93 %.  General: No acute distress Mood and affect are appropriate Heart: Regular rate and rhythm no rubs murmurs or extra sounds Lungs: Clear to auscultation, breathing unlabored, no rales or wheezes Abdomen: Positive bowel sounds, soft nontender to palpation, nondistended Extremities: No clubbing, cyanosis, or edema Skin: No evidence of breakdown, no evidence of rash   Skin:    General: Skin is warm.    Comments:   left foot with dressing, staples present, mild serosanguinous dressing. Skin remains very dry.   Neurological:    Mental Status: He is alert.    Comments: awake and alert. Follows commands. Dysconjugate gaze. Nystagmus with left lateral gaze more than right, right eye wanders with left gaze as well. P  Mild left lid ptosis.   Left facial weakness. Provides his name and age with some delay in processing and word finding.  Marland Kitchen RUE 4-/5. LUE 4+/5. RLE 3-4/5. LLE 3+/5 prox to distal, limited by wound on foot. Sensed pain in all 4's.---neuro exam stable 7/2 Musculoskeletal:        General: No swelling or tenderness.     Cervical back: Normal range of motion.    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 Functional deficits which require 3+ hours per day of interdisciplinary therapy in a comprehensive inpatient rehab setting. Physiatrist is providing close team supervision and 24 hour management of active medical problems listed below. Physiatrist and rehab team continue to assess barriers to discharge/monitor patient progress toward functional and medical goals  Care Tool:  Bathing    Body parts bathed by patient: Chest, Abdomen     Body parts n/a: Left lower leg   Bathing assist Assist Level: Moderate Assistance - Patient 50 - 74%     Upper Body Dressing/Undressing Upper body dressing   What is the patient wearing?: Pull over shirt    Upper body assist Assist Level: Maximal Assistance - Patient 25 - 49%    Lower Body Dressing/Undressing Lower body dressing      What is the patient wearing?: Incontinence brief     Lower body assist Assist for lower body dressing: Total Assistance - Patient < 25%  Toileting Toileting    Toileting assist Assist for toileting: Total Assistance - Patient < 25%     Transfers Chair/bed transfer  Transfers assist     Chair/bed transfer assist level: Moderate Assistance - Patient 50 - 74%     Locomotion Ambulation   Ambulation assist   Ambulation activity did not occur: Safety/medical concerns  Assist level: Minimal Assistance - Patient > 75% Assistive device: Walker-rolling Max distance: 72ft   Walk 10 feet activity   Assist  Walk 10 feet activity did not occur: Safety/medical concerns  Assist level: Minimal Assistance - Patient > 75% Assistive device: Walker-rolling   Walk 50 feet activity   Assist Walk 50 feet with 2 turns activity did not occur: Safety/medical concerns  Assist level: Minimal Assistance - Patient > 75% Assistive device: Walker-rolling    Walk 150 feet  activity   Assist Walk 150 feet activity did not occur: Safety/medical concerns         Walk 10 feet on uneven surface  activity   Assist Walk 10 feet on uneven surfaces activity did not occur: Safety/medical concerns         Wheelchair     Assist Will patient use wheelchair at discharge?: No   Wheelchair activity did not occur: N/A         Wheelchair 50 feet with 2 turns activity    Assist    Wheelchair 50 feet with 2 turns activity did not occur: N/A       Wheelchair 150 feet activity     Assist  Wheelchair 150 feet activity did not occur: N/A       Blood pressure 97/62, pulse 81, temperature 98.4 F (36.9 C), temperature source Oral, resp. rate 18, height 5' 3" (1.6 m), weight 57.9 kg, SpO2 93 %.    Medical Problem List and Plan: 1.  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  Impaired mobility, ambulating 40 feet -DVT/anticoagulation: Continue Lovenox             -antiplatelet therapy: Aspirin 81 mg daily and Plavix 75 mg day x3 weeks then Plavix alone.  Recommendations of 30-day cardiac event monitor 3. Diffuse muscle aches 2/2 RA: Neurontin 300 mg twice daily.   -using kpad for low/mid back pain, using tylenol also with benefit  -resumed Arava, added voltaren gel.  4. Mood: Provide emotional support             -antipsychotic agents: N/A 5. Neuropsych: This patient is not capable of making decisions on his own behalf. 7. Fluids/Electrolytes/Nutrition: Routine in and outs with follow-up chemistries on admit 8.  Chronic left foot wound.  Status post left foot incision and drainage debridement of  fourth met head resection 10/11/2020.   -Follow-up per Dr. Stover podiatry services as outpt -IV Ancef and Vibramycin course completed -continue daily dressing to foot -  Darco boot for gait/wb 9.  Systolic congestive heart failure.  Follow-up cardiology service.  Aldactone resumed 25 mg daily.  Monitor for any signs of fluid overload             -  weights trending down but I's/O's +,  eating 100%---question accuracy   Filed Weights   10/23/20 0451 10/24/20 0350 10/25/20 0500  Weight: 57.6 kg 57.2 kg 57.9 kg   Low fluid intake reduce diuretic  10.  Hypertension/nonsustained VT.  Lisinopril 5 mg daily, Imdur 60 mg daily, Toprol-XL 25 mg daily.  Monitor with increased mobility  6/28: hypotensive: decrease lisinopril to 2.5mg  6/29 bp still soft, pt asymptomatic  6/30: d/c 'ed Lisinopril---bpt soft/stable, asymptomatic Vitals:   10/25/20 1928 10/26/20 0458  BP: (!) 98/53 97/62  Pulse: 76 81  Resp: 20 18  Temp: 98.4 F (36.9 C) 98.4 F (36.9 C)  SpO2: 91% 93%   Also on Aldactone reduce to 12.5mg and Toprol XL 11.  RA.  Chronic prednisone.  resumed Arava 12.  High-level autism.    13. Hypoalbuminemia: added Juven  14. Hypogmanesemia: improved    LOS: 7 days A FACE TO FACE EVALUATION WAS PERFORMED  Andrew E Kirsteins 10/26/2020, 8:02 AM     

## 2020-10-26 NOTE — Progress Notes (Deleted)
Cardiology Office Note   Date:  10/26/2020   ID:  Jonathan [name], DOB 1961/04/07, MRN [id number]  PCP:  System, Provider Not In  Cardiologist:   Minus Breeding, MD Referring:  ***  No chief complaint on file.     History of Present Illness: Jonathan [name] is a 60 y.o. male who presents recently had a complicated hospitalization for osteomyelitis.  He had a left MCA infarct and had a thrombectomy.  He had an EF for 30 - 35%.  It was suggested that he get an out patient monitor to rule out atrial fib.   He did have EKG changes but ruled out for NSTEMI.   ***   He gets around with crutches because of chronic leg pain.  ***    for ***     Acute osteomyelitis of ankle and foot (HCC) Active Problems:   Rheumatoid arthritis involving multiple sites with positive rheumatoid factor (HCC)   Coronary artery disease involving native coronary artery of native heart   Chronic foot ulcer with fat layer exposed, left ([place])   Acute ischemic left MCA stroke (HCC)   Ischemic stroke (HCC)   Endotracheal tube present   Ischemic cardiomyopathy   Abnormal finding on EKG   Acute left MCA infarct due to left M1 occlusion -Status post mechanical thrombectomy -Neurology has signed off -LDL 101, continue Lipitor -A1c 5.5 -Continue dual antiplatelet therapy with aspirin and Plavix for 3 weeks, followed by Plavix alone -2D echo shows ejection fraction of 30 to 35% -Neurology has recommended 30-day cardiac event monitor on discharge to rule out occult A. Fib.  This can be arranged by cardiology if they are contacted prior to discharge from the hospital -Seen by physical therapy with recommendation for CIR -Discussed with family and they would like to pursue CIR   EKG changes Cardiomyopathy -Initial concerns for possible STEMI on EKG due to diffuse ST elevations -Troponins negative -Seen by cardiology who felt EKG changes were likely related to stroke, no reciprocal ST depressions to suggest  an actual ST elevation MI -Ejection fraction of 30 to 35% on echocardiogram, unchanged from prior -No plans on invasive angiography -Currently on beta-blockers, spironolactone, Imdur and lisinopril -Volume status appears to be euvolemic -We will use Lasix as needed   Nonsustained VT -Asymptomatic -Continue beta-blockers   Hypertension -Blood pressure is controlled, if not on the low end -We will continue cardiac medications with holding parameters for low blood pressure   Hyperlipidemia -Continue statin   Osteomyelitis of left foot -Status post left foot incision and drainage with debridement, fourth metatarsal head resection and placement of graft by podiatry -Cultures from procedure growing Proteus mirabilis and Staphylococcus cohnii -He has completed 10 days of IV antibiotics -Seen by vascular surgery during his hospital stay and it was not felt that patient had any evidence of lower extremity arterial disease -Care reviewed with Dr. Amalia Hailey, on-call for podiatry.  He will relay care plan to patient's primary podiatrist, Dr. Cannon Kettle.  Podiatry will follow up with the patient for further wound care after transfer to inpatient rehab -Currently wound management includes: Apply surgical lube and dry dressing to the graft site, leave the Adaptic and Steri-Strips in place (graft is underneath it) just put the lube on top and then cover with dry dressing to the left foot once daily   Rheumatoid arthritis -He is chronically on prednisone 5 mg twice daily as well as leflunomide   Past Medical History:  Diagnosis Date   Anemia  H/o using iron in the past    Arthritis    rheumatoid   Chronic back pain    Chronic HFrEF (heart failure with reduced ejection fraction) ([place]) 08/2019   Admitted 6 4-7/20/2022 with CHF   Coronary artery disease, occlusive 08/01/2019   RCA CTO with left-to-right collaterals.  Widely patent LCA-large LAD was several small diagonal branches and small diffusely  diseased LCx with multiple OM branches.   Hyperlipidemia    Ischemic cardiomyopathy 09/02/2019   Initial EF was 40 to 45%, as of June 2022 EF now 30 to 35%.  Known RCA occlusion.  There is inferior and inferoseptal akinesis on echo.   Joint pain    Joint swelling    Rheumatoid arthritis(714.0)     Past Surgical History:  Procedure Laterality Date   COLONOSCOPY N/A 04/03/2013   Procedure: COLONOSCOPY;  Surgeon: Irene Shipper, MD;  Location: [facility];  Service: Endoscopy;  Laterality: N/A;   COLONOSCOPY     ENDOVENOUS ABLATION SAPHENOUS VEIN W/ LASER Left 09/26/2019   endovenous laser ablation left greater saphenous vein by Gae Gallop MD    ENDOVENOUS ABLATION SAPHENOUS VEIN W/ LASER Right 10/31/2019   endovenous laser ablation right greater saphenous vein by Gae Gallop MD    ESOPHAGOGASTRODUODENOSCOPY N/A 04/03/2013   Procedure: ESOPHAGOGASTRODUODENOSCOPY (EGD);  Surgeon: Irene Shipper, MD;  Location: [facility];  Service: Endoscopy;  Laterality: N/A;  changed to moderate dr. Henrene Pastor did not want to go to the o.r. for an endo colon-jmt   EYE SURGERY Left    "when i was a very small boy"   GRAFT APPLICATION Left 10/23/1599   Procedure: GRAFT APPLICATION;  Surgeon: Landis Martins, DPM;  Location: [facility];  Service: Podiatry;  Laterality: Left;  [place] (rep already contacted: Matt Sides)   INCISION AND DRAINAGE OF WOUND Left 10/11/2020   Procedure: IRRIGATION AND DEBRIDEMENT WOUND;  Surgeon: Landis Martins, DPM;  Location: [facility];  Service: Podiatry;  Laterality: Left;   IR CT HEAD LTD  10/14/2020   IR PERCUTANEOUS ART THROMBECTOMY/INFUSION INTRACRANIAL INC DIAG ANGIO  10/14/2020   IR US GUIDE VASC ACCESS RIGHT  10/14/2020   JOINT REPLACEMENT     both knees   KNEE ARTHROPLASTY  06/07/2011   Procedure: COMPUTER ASSISTED TOTAL KNEE ARTHROPLASTY;  Surgeon: Mcarthur Rossetti, MD;  Location: [facility];  Service: Orthopedics;  Laterality: Right;  Right total knee arthoplasty   KNEE  CLOSED REDUCTION  07/19/2011   Procedure: CLOSED MANIPULATION KNEE;  Surgeon: Mcarthur Rossetti, MD;  Location: [facility];  Service: Orthopedics;  Laterality: Right;  Manipulation under anesthesia right knee   LEFT HEART CATH AND CORONARY ANGIOGRAPHY N/A 08/21/2019   Procedure: LEFT HEART CATH AND CORONARY ANGIOGRAPHY;  Surgeon: Belva Crome, MD;  Location: [facility];  Service: Cardiovascular; Mid to distal RCA CTO with faint left-to-right collaterals.  Widely patent left main.  Mid to distal LCx mild luminal irregularities.  Multiple OM branches.  Large draping LAD with several small diagonal branches.  EF ~40 to 45% with inferior AK.   METATARSAL HEAD EXCISION Left 10/11/2020   Procedure: METATARSAL HEAD EXCISION;  Surgeon: Landis Martins, DPM;  Location: [facility];  Service: Podiatry;  Laterality: Left;  4th metatarsal   RADIOLOGY WITH ANESTHESIA N/A 10/14/2020   Procedure: IR WITH ANESTHESIA;  Surgeon: Radiologist, Medication, MD;  Location: [facility];  Service: Radiology;  Laterality: N/A;   RIGHT/LEFT HEART CATH AND CORONARY ANGIOGRAPHY N/A 09/28/2020   Procedure: RIGHT/LEFT HEART  CATH AND CORONARY ANGIOGRAPHY;  Surgeon: Nelva Bush, MD;  Location: [facility];  Service: Cardiovascular;; Single-vessel CAD with CTO of mid RCA.  Mild disease involving the LCA (LAD & LCx w/ multiple OM).  Stable since 2021.  Mild to mod elevated LVEDP.  Normal-mildly reduced CO/CI - 5.2, 3.3 ([place]), 4.6, 2.8 by (TD). RA 7 , RV 32/7, PA 29/20, PCWP 22.   TOTAL HIP ARTHROPLASTY Right 11/11/2014   Procedure: RIGHT TOTAL HIP ARTHROPLASTY ANTERIOR APPROACH;  Surgeon: Mcarthur Rossetti, MD;  Location: [facility];  Service: Orthopedics;  Laterality: Right;   TOTAL HIP ARTHROPLASTY Left 01/29/2015   Procedure: LEFT TOTAL HIP ARTHROPLASTY ANTERIOR APPROACH;  Surgeon: Mcarthur Rossetti, MD;  Location: [facility];  Service: Orthopedics;  Laterality: Left;   TRANSTHORACIC ECHOCARDIOGRAM  08/22/2019   EF 50 to  55%.  GR 1 DD. Mild Inferior Hypokinesis.   TRANSTHORACIC ECHOCARDIOGRAM  09/27/2019   EF 30-35%. Moderately reduced LVEF with Inferior/Inferoseptal Akinesis. Elevated LAP with mild LA dilation - Gr II DD. Mild-Mod AI, no AS.     No current facility-administered medications for this visit.   No current outpatient medications on file.   Facility-Administered Medications Ordered in Other Visits  Medication Dose Route Frequency Provider Last Rate Last Admin   acetaminophen (TYLENOL) tablet 650 mg  650 mg Oral Q4H PRN Cathlyn Parsons, PA-C   650 mg at 10/24/20 2004   Or   acetaminophen (TYLENOL) 160 MG/5ML solution 650 mg  650 mg Per Tube Q4H PRN Angiulli, Lavon Paganini, PA-C       Or   acetaminophen (TYLENOL) suppository 650 mg  650 mg Rectal Q4H PRN Angiulli, Lavon Paganini, PA-C       aspirin chewable tablet 81 mg  81 mg Oral Daily Cathlyn Parsons, PA-C   81 mg at 10/26/20 0805   atorvastatin (LIPITOR) tablet 80 mg  80 mg Oral Daily Cathlyn Parsons, PA-C   80 mg at 10/26/20 8338   clopidogrel (PLAVIX) tablet 75 mg  75 mg Oral Daily Cathlyn Parsons, PA-C   75 mg at 10/26/20 2505   diclofenac Sodium (VOLTAREN) 1 % topical gel 2 g  2 g Topical QID Raulkar, Clide Deutscher, MD   2 g at 10/26/20 1600   enoxaparin (LOVENOX) injection 40 mg  40 mg Subcutaneous Q24H AngiulliLavon Paganini, PA-C   40 mg at 10/26/20 1440   fluticasone (FLONASE) 50 MCG/ACT nasal spray 1 spray  1 spray Each Nare Daily PRN Angiulli, Lavon Paganini, PA-C       gabapentin (NEURONTIN) capsule 300 mg  300 mg Oral TID Izora Ribas, MD   300 mg at 10/26/20 1440   hydrocerin (EUCERIN) cream   Topical BID Cathlyn Parsons, PA-C   Given at 10/26/20 3976   isosorbide mononitrate (IMDUR) 24 hr tablet 60 mg  60 mg Oral QPM AngiulliLavon Paganini, PA-C   60 mg at 10/25/20 1751   leflunomide (ARAVA) tablet 10 mg  10 mg Oral Daily Cathlyn Parsons, PA-C   10 mg at 10/26/20 0805   magnesium sulfate IVPB 2 g 50 mL  2 g Intravenous Once Izora Ribas, MD 50 mL/hr at 10/26/20 1443 2 g at 10/26/20 1443   metoprolol succinate (TOPROL-XL) 24 hr tablet 25 mg  25 mg Oral QPM AngiulliLavon Paganini, PA-C   25 mg at 10/25/20 1752   montelukast (SINGULAIR) tablet 10 mg  10 mg Oral QHS Cathlyn Parsons, PA-C  10 mg at 10/25/20 2049   nutrition supplement (JUVEN) (JUVEN) powder packet 1 packet  1 packet Oral BID WC Raulkar, Clide Deutscher, MD   1 packet at 10/26/20 0806   ondansetron (ZOFRAN) tablet 4 mg  4 mg Oral Q6H PRN Cathlyn Parsons, PA-C       Or   ondansetron [facility]) injection 4 mg  4 mg Intravenous Q6H PRN Angiulli, Lavon Paganini, PA-C       pantoprazole (PROTONIX) EC tablet 40 mg  40 mg Oral Daily Cathlyn Parsons, PA-C   40 mg at 10/26/20 0805   predniSONE (DELTASONE) tablet 5 mg  5 mg Oral BID WC Angiulli, Lavon Paganini, PA-C   5 mg at 10/26/20 0805   senna-docusate (Senokot-S) tablet 1 tablet  1 tablet Oral QHS PRN Cathlyn Parsons, PA-C   1 tablet at 10/24/20 1744   [START ON 10/27/2020] spironolactone (ALDACTONE) tablet 12.5 mg  12.5 mg Oral Daily Kirsteins, Luanna Salk, MD        Allergies:   Infliximab    ROS:  Please see the history of present illness.   Otherwise, review of systems are positive for {NONE DEFAULTED:18576}.   All other systems are reviewed and negative.    PHYSICAL EXAM: VS:  There were no vitals taken for this visit. , BMI There is no height or weight on file to calculate BMI. GENERAL:  Well appearing NECK:  No jugular venous distention, waveform within normal limits, carotid upstroke brisk and symmetric, no bruits, no thyromegaly LUNGS:  Clear to auscultation bilaterally CHEST:  Unremarkable HEART:  PMI not displaced or sustained,S1 and S2 within normal limits, no S3, no S4, no clicks, no rubs, *** murmurs ABD:  Flat, positive bowel sounds normal in frequency in pitch, no bruits, no rebound, no guarding, no midline pulsatile mass, no hepatomegaly, no splenomegaly EXT:  2 plus pulses throughout, no edema, no  cyanosis no clubbing   EKG:  EKG {ACTION; IS/IS RSW:[id number]} ordered today. The ekg ordered today demonstrates ***   Recent Labs: 09/26/2020: B Natriuretic Peptide 488.9 10/20/2020: ALT 11; Hemoglobin 9.3; Platelets 308 10/26/2020: BUN 17; Creatinine, Ser 0.57; Magnesium 1.6; Potassium 3.9; Sodium 135    Lipid Panel    Component Value Date/Time   CHOL 160 10/15/2020 0500   TRIG 103 10/15/2020 0500   TRIG 105 10/15/2020 0500   HDL 38 (L) 10/15/2020 0500   CHOLHDL 4.2 10/15/2020 0500   VLDL 21 10/15/2020 0500   LDLCALC 101 (H) 10/15/2020 0500      Wt Readings from Last 3 Encounters:  10/25/20 127 lb 10.3 oz (57.9 kg)  10/19/20 143 lb 1.3 oz (64.9 kg)  09/28/20 128 lb 15.5 oz (58.5 kg)     Cardiac cath 09/28/20   Intervention   Other studies Reviewed: Additional studies/ records that were reviewed today include: ***. Review of the above records demonstrates:  Please see elsewhere in the note.  ***   ASSESSMENT AND PLAN:  CARDIOMYOPATHY:   Ischemic.  ***  CAD:   CTO RCA.   ***    MCA STROKE:  ***  HTN:  ***    DYSLIPIDEMIA:  ***  NSVT:  ***   Current medicines are reviewed at length with the patient today.  The patient {ACTIONS; HAS/DOES NOT HAVE:19233} concerns regarding medicines.  The following changes have been made:  {PLAN; NO CHANGE:13088:s}  Labs/ tests ordered today include: *** No orders of the defined types were placed in this encounter.  Disposition:   FU with ***    Signed, Minus Breeding, MD  10/26/2020 3:36 PM    [facility]

## 2020-10-26 NOTE — Progress Notes (Signed)
PROGRESS NOTE   Subjective/Complaints: Patient's chart reviewed- No issues reported overnight Vitals signs stable  No complaints this morning but reporting foot pain when working with therapy- discussed increasing gabapentin to TID and he is agreeable.  ROS: Patient denies fever, rash, sore throat, blurred vision, nausea, vomiting, diarrhea, cough, shortness of breath or chest pain, joint or back pain, headache, or mood change.   Objective:   No results found. No results for input(s): WBC, HGB, HCT, PLT in the last 72 hours.  No results for input(s): NA, K, CL, CO2, GLUCOSE, BUN, CREATININE, CALCIUM in the last 72 hours.   Intake/Output Summary (Last 24 hours) at 10/26/2020 0613 Last data filed at 10/25/2020 1800 Gross per 24 hour  Intake 360 ml  Output --  Net 360 ml        Physical Exam: Vital Signs Blood pressure 97/62, pulse 81, temperature 98.4 F (36.9 C), temperature source Oral, resp. rate 18, height _0  (1.6 m), weight 57.9 kg, SpO2 93 %. Gen: no distress, normal appearing HEENT: oral mucosa pink and moist, NCAT Cardio: Reg rate Chest: normal effort, normal rate of breathing Abd: soft, non-distended Ext: no edema Psych: pleasant, normal affect Skin: intact Neuro: Musculoskeletal:  Skin:    General: Skin is warm.    Comments:   left foot with dressing, staples present, mild serosanguinous dressing. Skin remains very dry.   Neurological:    Mental Status: He is alert.    Comments: awake and alert. Follows commands. Dysconjugate gaze. Nystagmus with left lateral gaze more than right, right eye wanders with left gaze as well. P  Mild left lid ptosis.   Left facial weakness. Provides his name and age with some delay in processing and word finding.  Marland Kitchen RUE 4-/5. LUE 4+/5. RLE 3-4/5. LLE 3+/5 prox to distal, limited by wound on foot. Sensed pain in all 4's.---neuro exam stable 7/2 Musculoskeletal:        General:  No swelling or tenderness.    Cervical back: Normal range of motion.    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 Functional deficits which require 3+ hours per day of interdisciplinary therapy in a comprehensive inpatient rehab setting. Physiatrist is providing close team supervision and 24 hour management of active medical problems listed below. Physiatrist and rehab team continue to assess barriers to discharge/monitor patient progress toward functional and medical goals  Care Tool:  Bathing    Body parts bathed by patient: Chest, Abdomen     Body parts n/a: Left lower leg   Bathing assist Assist Level: Moderate Assistance - Patient 50 - 74%     Upper Body Dressing/Undressing Upper body dressing   What is the patient wearing?: Pull over shirt    Upper body assist Assist Level: Maximal Assistance - Patient 25 - 49%    Lower Body Dressing/Undressing Lower body dressing      What is the patient wearing?: Incontinence brief     Lower body assist Assist for lower body dressing: Total Assistance - Patient < 25%     Toileting Toileting    Toileting assist Assist for toileting: Total Assistance - Patient <  25%     Transfers Chair/bed transfer  Transfers assist     Chair/bed transfer assist level: Moderate Assistance - Patient 50 - 74%     Locomotion Ambulation   Ambulation assist   Ambulation activity did not occur: Safety/medical concerns  Assist level: Minimal Assistance - Patient > 75% Assistive device: Walker-rolling Max distance: 41f   Walk 10 feet activity   Assist  Walk 10 feet activity did not occur: Safety/medical concerns  Assist level: Minimal Assistance - Patient > 75% Assistive device: Walker-rolling   Walk 50 feet activity   Assist Walk 50 feet with 2 turns activity did not occur: Safety/medical concerns  Assist level: Minimal Assistance - Patient > 75% Assistive device: Walker-rolling     Walk 150 feet activity   Assist Walk 150 feet activity did not occur: Safety/medical concerns         Walk 10 feet on uneven surface  activity   Assist Walk 10 feet on uneven surfaces activity did not occur: Safety/medical concerns         Wheelchair     Assist Will patient use wheelchair at discharge?: No   Wheelchair activity did not occur: N/A         Wheelchair 50 feet with 2 turns activity    Assist    Wheelchair 50 feet with 2 turns activity did not occur: N/A       Wheelchair 150 feet activity     Assist  Wheelchair 150 feet activity did not occur: N/A       Blood pressure 97/62, pulse 81, temperature 98.4 F (36.9 C), temperature source Oral, resp. rate 18, height _0  (1.6 m), weight 57.9 kg, SpO2 93 %.    Medical Problem List and Plan: 1.  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  Impaired mobility, ambulating 25 feet -DVT/anticoagulation: Continue Lovenox             -antiplatelet therapy: Aspirin 81 mg daily and Plavix 75 mg day x3 weeks then Plavix alone.  Recommendations of 30-day cardiac event monitor 3. Diffuse muscle aches 2/2 RA: Neurontin 300 mg twice daily.   -using kpad for low/mid back pain, using tylenol also with benefit  -resumed Arava, added voltaren gel.  4. Mood: Provide emotional support             -antipsychotic agents: N/A 5. Neuropsych: This patient is not capable of making decisions on his own behalf. 7. Fluids/Electrolytes/Nutrition: Routine in and outs with follow-up chemistries on admit 8.  Chronic left foot wound.  Status post left foot incision and  drainage debridement of fourth met head resection 10/11/2020.   -Follow-up per Dr. SCannon Kettlepodiatry services as outpt -IV Ancef and Vibramycin course completed -continue daily dressing to foot -  Darco boot for gait/wb -pain in foot limiting therapy- increase Gabapentin to 3088mTID 9.  Systolic congestive heart failure.  Follow-up cardiology service.  Weights stable- Continue Aldactone 25 mg daily.  Monitor for any signs of fluid overload             -  weights trending down but I's/O's +,  eating 100%---question accuracy   Filed Weights   10/23/20 0451 10/24/20 0350 10/25/20 0500  Weight: 57.6 kg 57.2 kg 57.9 kg    10.  Hypertension/nonsustained VT.  D/c Lisinopril-BP still soft/asymptomatic. Imdur 60 mg daily, Toprol-XL 25 mg daily.  Monitor with increased mobility 11.  RA.  Chronic prednisone.  resumed Arava 12.  High-level autism.    13. Hypoalbuminemia: added Juven  14. Hypogmanesemia: supplement 2grams IV mag today and repeat magnesium level tomorrow.     LOS: 7 days A FACE TO FACE EVALUATION WAS PERFORMED  Jonathan [name] 10/26/2020, 6:13 AM

## 2020-10-27 ENCOUNTER — Telehealth: Payer: Self-pay | Admitting: Sports Medicine

## 2020-10-27 ENCOUNTER — Ambulatory Visit: Payer: Medicare Other | Admitting: Cardiology

## 2020-10-27 LAB — BASIC METABOLIC PANEL
Anion gap: 5 (ref 5–15)
BUN: 20 mg/dL (ref 6–20)
CO2: 25 mmol/L (ref 22–32)
Calcium: 9.8 mg/dL (ref 8.9–10.3)
Chloride: 103 mmol/L (ref 98–111)
Creatinine, Ser: 0.44 mg/dL — ABNORMAL LOW (ref 0.61–1.24)
GFR, Estimated: >60 mL/min (ref >60)
Glucose, Bld: 104 mg/dL — ABNORMAL HIGH (ref 70–99)
Potassium: 4 mmol/L (ref 3.5–5.1)
Sodium: 133 mmol/L — ABNORMAL LOW (ref 135–145)

## 2020-10-27 LAB — MAGNESIUM: Magnesium: 1.9 mg/dL (ref 1.7–2.4)

## 2020-10-27 MED ORDER — TAB-A-VITE/IRON PO TABS
1.0000 | ORAL_TABLET | Freq: Every day | ORAL | Status: DC
  Administered 2020-10-27 – 2020-10-30 (×4): 1 via ORAL
  Filled 2020-10-27 (×5): qty 1

## 2020-10-27 MED ORDER — MAGNESIUM GLUCONATE 500 MG PO TABS
500.0000 mg | ORAL_TABLET | Freq: Every day | ORAL | Status: DC
  Administered 2020-10-27 – 2020-10-29 (×3): 500 mg via ORAL
  Filled 2020-10-27 (×3): qty 1

## 2020-10-27 MED ORDER — VITAMIN D (ERGOCALCIFEROL) 1.25 MG (50000 UNIT) PO CAPS
50000.0000 [IU] | ORAL_CAPSULE | ORAL | Status: DC
  Administered 2020-10-27: 50000 [IU] via ORAL
  Filled 2020-10-27: qty 1

## 2020-10-27 NOTE — Progress Notes (Signed)
Occupational Therapy Session Note  Patient Details  Name: Morgon Pamer MRN: [id number] Date of Birth: [birth year]  Today's Date: 10/27/2020 OT Individual Time: 1345-1425 OT Individual Time Calculation (min): 40 min    Short Term Goals: Week 2:     Skilled Therapeutic Interventions/Progress Updates:    Pt resting in w/c upon arrival.  OT intervention with focus on sit<>stand, standing balance, brushing teeth seated in w/c, and safety awareness. Sit<>stand from w/c x5 with mod A. Pt with difficulty unweighting/leaning forward and demonstrates minimal retropulsion requiring assistance to initiate sit>stand. Pt places BUE on RW and unable to push up from w/c 2/2 arm/body ratio. Coban wrapped on tooth brush and pt able to brush teeth adequately without assistance after toohpaste applied to brush. Pt remained in w/c with all needs within reach and belt alarm activated.   Therapy Documentation Precautions:  Precautions Precautions: Fall Required Braces or Orthoses: Other Brace Other Brace: post op shoe Restrictions Weight Bearing Restrictions: No LLE Weight Bearing: Weight bearing as tolerated Other Position/Activity Restrictions: in post-op shoe Pain:  Pt denies pain this afternoon    Therapy/Group: Individual Therapy  Leroy Libman 10/27/2020, 2:43 PM

## 2020-10-27 NOTE — Progress Notes (Signed)
Occupational Therapy Weekly Progress Note  Patient Details  Name: Jonathan [name] MRN: [id number] Date of Birth: June 29, 1960  Beginning of progress report period: October 20, 2020 End of progress report period: October 27, 2020  Today's Date: 10/27/2020 OT Individual Time: 0900-1000 OT Individual Time Calculation (min): 60 min    Patient has met 3 of 3 short term goals.  Pt is making steady progress towards goals and is close to approaching baseline level of care, however pt is limited in progress towards toileting goal due to new onset of incontinence of bowel and bladder since date of injury.  Pt also exhibits significant laxity of joints of bilateral digits of hands including the thumb resulting in weakness, pain, and stiffness and impaired functional grasp which appears to be his baseline, however with new onset of balance difficulties, this limits independence with toileting.  Pt requires increased level of assist of total care to complete clothing management and pericare at bed level with episodes of incontinence especially of bowel.  When pt is continent, he is able to complete transfer to 3 in 1commode and toileting tasks with min-mod assist.  Pt exhibits increased independence with self feeding with use and after training on AE including built up handles and lengthened straw requiring min assist.  Pt also shows improved UB bathing independence with use of long handled sponge with built up handle. Pt would benefit from incorporating caregiver education with pt's cousin to facilitate improved carryover of compensatory and adaptive strategies when home since this is who will be assisting pt primarily at discharge.    Patient continues to demonstrate the following deficits: muscle weakness, decreased cardiorespiratoy endurance, decreased coordination, decreased visual motor skills, decreased initiation, decreased attention, decreased awareness, decreased problem solving, decreased safety awareness,  decreased memory, and delayed processing, and decreased standing balance and decreased balance strategies and therefore will continue to benefit from skilled OT intervention to enhance overall performance with BADL.  Patient progressing toward long term goals..  Continue plan of care.  OT Short Term Goals Week 1:  OT Short Term Goal 1 (Week 1): Pt will self feed using AE as needed with max assist OT Short Term Goal 1 - Progress (Week 1): Met OT Short Term Goal 2 (Week 1): Pt will complete toilet transfer with mod assist OT Short Term Goal 2 - Progress (Week 1): Met OT Short Term Goal 3 (Week 1): Pt will complete toileting with max assist OT Short Term Goal 3 - Progress (Week 1): Met Week 2:  OT Short Term Goal 1 (Week 2): STGs=LTGs due to ELOS  Skilled Therapeutic Interventions/Progress Updates:    Pt supine in bed, getting ready to toilet at bed level with nurse tech.  Pt encouraged by therapist to participate in toileting at 3 in 1 commode for increased independence.  Pt reported "no" when asked by therapist if he had to have a bowel movement. However, upon therapist returning from retrieving needed ADL items, pt reports "I had one", indicating he was incontinent of bowel.  Pt reports he needs more time as well to complete bowel movement.  Pt appearing tearful and indicating through yes and no answered questions that his stomach hurts and that he feels constipated.  Nurse made aware and present to assess further.  Pt required total assist to complete clothing mgt and pericare at bed level for incontinent stool.  Agreeable to sit up at end of session.  Supine to sit with min assist and increased time.  Stand pivot using  RW with mod assist for initial power up and CGA for pivot and min assist for sit.  Call bell in reach, seat alarm on at end of session.   Therapy Documentation Precautions:  Precautions Precautions: Fall Required Braces or Orthoses: Other Brace Other Brace: post op  shoe Restrictions Weight Bearing Restrictions: No LLE Weight Bearing: Weight bearing as tolerated Other Position/Activity Restrictions: in post-op shoe  Pain: Pain Assessment Pain Scale: Faces Faces Pain Scale: Hurts little more Pain Location: Foot Pain Orientation: Left Pain Intervention(s): Medication (See eMAR) ADL: ADL Eating: Dependent Where Assessed-Eating: Wheelchair Upper Body Dressing: Dependent Where Assessed-Upper Body Dressing: Wheelchair Lower Body Dressing: Dependent Where Assessed-Lower Body Dressing: Wheelchair Vision   Perception    Praxis   Exercises:   Other Treatments:     Therapy/Group: Individual Therapy  Ezekiel Slocumb 10/27/2020, 12:46 PM

## 2020-10-27 NOTE — Progress Notes (Signed)
Physical Therapy Session Note  Patient Details  Name: Jonathan [name] MRN: [id number] Date of Birth: [birth year]  Today's Date: 10/27/2020 PT Individual Time: 1050-1200 PT Individual Time Calculation (min): 70 min   Short Term Goals: Week 2:  PT Short Term Goal 1 (Week 2): STG = LTG due to ELOS  Skilled Therapeutic Interventions/Progress Updates:    Pt sleeping in w/c at start of session, awakens to voice easily. Pt reports "I'm not doing too good today." He had difficulty describing his reasoning but with questioning, appears to be having stomach pains, reported as 8/10. RN made aware and reports that he may be having constipation. He initially deferred participating in PT because of this stomach pain. Ultimately he was agreeable with convincing, but requesting light activity. Wheeled in w/c for time management and energy conservation to day room rehab gym. Wheeled in front of [employer] and he completed 8 minutes of alternating stepping with resistance set to ~50 cm/sec - required a few brief seated rest breaks to complete and needed assist for stabilizing w/c due to posterior pushing. CSW arriving to inform therapist that a standard w/c will need to be purchased out of pocket for patient as he received a power w/c in 2020 - cousin reported that power w/c does not fit in the home and CSW will follow up. Completed functional gait training where he ambulated ~42ft + 154ft with CGA (progressing to close supervision) and RW - short shuffling steps with poor foot clearance and step length - cues throughout for increasing step length and height and he was able to initiate improved deficits but difficulty to maintain. Next, worked on repeated step ups to [employer] with RW support and CGA for balance - completed 2x15 reps on RLE + 2x15 reps on LLE - initially had difficulty with producing R hip flexion to raise R foot on platform but with reps he demonstrated improved ability. Pt requesting to return to his  room at this point of the session - wheeled back to his room in w/c and remained seated in w/c at end of session. Safety belt alarm on and all needs in reach. Retrieved his rock that he painted on July 4th from the rehab gym yesterday and delivered to patient - pt appreciative.   Therapy Documentation Precautions:  Precautions Precautions: Fall Required Braces or Orthoses: Other Brace Other Brace: post op shoe Restrictions Weight Bearing Restrictions: No LLE Weight Bearing: Weight bearing as tolerated Other Position/Activity Restrictions: in post-op shoe General:     Therapy/Group: Individual Therapy  Alger Simons 10/27/2020, 7:36 AM

## 2020-10-27 NOTE — Telephone Encounter (Signed)
Dan the PA in rehab wanted someone to stop by the hospital and look at Mr. Doverspike foot, he is room [phone]. He had a I& D procedure with Dr. Cannon Kettle. Please give Linna Hoff a call at [phone]

## 2020-10-27 NOTE — Telephone Encounter (Signed)
Yes, he is due for a visit.  I saw him last week, Monday, 10/19/2020 and said in my note that we would see him weekly.  Dr. Jacqualyn Posey is already aware

## 2020-10-27 NOTE — Progress Notes (Signed)
Patient ID: Jonathan [name], male   DOB: 11/06/1960, 61 y.o.   MRN: [id number]  Met with pt and spoke with cousin-Vernell to discuss team conference progress and discharge 7/8. Have scheduled for cousin to come in Thursday at 1';00-3;00 for education to see what assist pt will need when doesn't have his aide there. Informed cousin pt will need additional assist after aide leaves for the day. Continue to work on discharge needs.

## 2020-10-27 NOTE — Progress Notes (Addendum)
PROGRESS NOTE   Subjective/Complaints: Left foot wound increased in size and with green drainage- will request podiatry to eval today. Afebrile K+ improved Sleeping well at night   ROS: Patient denies fever, rash, sore throat, blurred vision, nausea, vomiting, diarrhea, cough, shortness of breath or chest pain, joint or back pain, headache, or mood change. Left foot pain is better controlled  Objective:   No results found. No results for input(s): WBC, HGB, HCT, PLT in the last 72 hours.  Recent Labs    10/26/20 0546 10/27/20 0521  NA 135 133*  K 3.9 4.0  CL 105 103  CO2 24 25  GLUCOSE 97 104*  BUN 17 20  CREATININE 0.57* 0.44*  CALCIUM 9.6 9.8     Intake/Output Summary (Last 24 hours) at 10/27/2020 0824 Last data filed at 10/27/2020 0820 Gross per 24 hour  Intake 230 ml  Output 300 ml  Net -70 ml        Physical Exam: Vital Signs Blood pressure 104/61, pulse 76, temperature 98.1 F (36.7 C), resp. rate 17, height 5' 3"  (1.6 m), weight 55.1 kg, SpO2 100 %. Gen: no distress, normal appearing HEENT: oral mucosa pink and moist, NCAT Cardio: Reg rate Chest: normal effort, normal rate of breathing Abd: soft, non-distended Ext: no edema Psych: pleasant, normal affect Skin:    General: Skin is warm.    Comments:   left foot with dressing, staples present, mild serosanguinous dressing. Skin remains very dry. Wound appears to be increased in size with yellow-greenish drainage.   Neurological:    Mental Status: He is alert.    Comments: awake and alert. Follows commands. Dysconjugate gaze. Nystagmus with left lateral gaze more than right, right eye wanders with left gaze as well. P  Mild left lid ptosis.   Left facial weakness. Provides his name and age with some delay in processing and word finding.  Marland Kitchen RUE 4-/5. LUE 4+/5. RLE 3-4/5. LLE 3+/5 prox to distal, limited by wound on foot. Sensed pain in all 4's.---neuro  exam stable 7/2 Musculoskeletal:        General: No swelling or tenderness.    Cervical back: Normal range of motion.    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 Functional deficits which require 3+ hours per day of interdisciplinary therapy in a comprehensive inpatient rehab setting. Physiatrist is providing close team supervision and 24 hour management of active medical problems listed below. Physiatrist and rehab team continue to assess barriers to discharge/monitor patient progress toward functional and medical goals  Care Tool:  Bathing    Body parts bathed by patient: Chest, Abdomen     Body parts n/a: Left lower leg   Bathing assist Assist Level: Moderate Assistance - Patient 50 - 74%     Upper Body Dressing/Undressing Upper body dressing   What is the patient wearing?: Pull over shirt    Upper body assist Assist Level: Maximal Assistance - Patient 25 - 49%    Lower Body Dressing/Undressing Lower body dressing      What is the patient wearing?: Incontinence brief     Lower body assist Assist for lower  body dressing: Total Assistance - Patient < 25%     Toileting Toileting    Toileting assist Assist for toileting: Total Assistance - Patient < 25%     Transfers Chair/bed transfer  Transfers assist     Chair/bed transfer assist level: Moderate Assistance - Patient 50 - 74%     Locomotion Ambulation   Ambulation assist   Ambulation activity did not occur: Safety/medical concerns  Assist level: Minimal Assistance - Patient > 75% Assistive device: Walker-rolling Max distance: 50f   Walk 10 feet activity   Assist  Walk 10 feet activity did not occur: Safety/medical concerns  Assist level: Minimal Assistance - Patient > 75% Assistive device: Walker-rolling   Walk 50 feet activity   Assist Walk 50 feet with 2 turns activity did not occur: Safety/medical concerns  Assist level: Minimal Assistance -  Patient > 75% Assistive device: Walker-rolling    Walk 150 feet activity   Assist Walk 150 feet activity did not occur: Safety/medical concerns         Walk 10 feet on uneven surface  activity   Assist Walk 10 feet on uneven surfaces activity did not occur: Safety/medical concerns         Wheelchair     Assist Will patient use wheelchair at discharge?: No   Wheelchair activity did not occur: N/A         Wheelchair 50 feet with 2 turns activity    Assist    Wheelchair 50 feet with 2 turns activity did not occur: N/A       Wheelchair 150 feet activity     Assist  Wheelchair 150 feet activity did not occur: N/A       Blood pressure 104/61, pulse 76, temperature 98.1 F (36.7 C), resp. rate 17, height 5' 3"  (1.6 m), weight 55.1 kg, SpO2 100 %.    Medical Problem List and Plan: 1.  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  Impaired mobility, ambulating 140 feet -DVT/anticoagulation: Continue Lovenox             -antiplatelet therapy: Aspirin 81 mg daily and Plavix 75 mg day x3 weeks then Plavix alone.  Recommendations of 30-day cardiac event monitor 3. Diffuse muscle aches 2/2 RA: Neurontin 300 mg twice daily.   -using kpad for low/mid back pain, using tylenol also with benefit  -resumed Arava, added voltaren gel.  4. Mood: Provide emotional support             -antipsychotic agents: N/A 5. Neuropsych: This patient is not capable of making decisions on his own behalf. 7. Fluids/Electrolytes/Nutrition: Routine in and outs with follow-up chemistries on  admit 8.  Chronic left foot wound.  Status post left foot incision and drainage debridement of fourth met head resection 10/11/2020.   -Follow-up per Dr. SCannon Kettlepodiatry services as outpt, contacted team given larger wound- appreciate their evaluation today.  -IV Ancef and Vibramycin course completed -continue daily dressing to foot -  Darco boot for gait/wb -pain  in foot limiting therapy- increase Gabapentin to 329m TID 9.  Systolic congestive heart failure.  Follow-up cardiology service.  Weights stable- Continue Aldactone 25 mg daily.  Monitor for any signs of fluid overload             -weights trending down but I's/O's +,  eating 100%---question accuracy   Filed Weights   10/24/20 0350 10/25/20 0500 10/27/20 0522  Weight: 57.2 kg 57.9 kg 55.1 kg    10.  Hypertension/nonsustained VT.  D/c Lisinopril-BP still soft/asymptomatic. Imdur 60 mg daily, Toprol-XL 25 mg daily.  Monitor with increased mobility 11.  RA.  Continue prednisone.  resumed Arava 12.  High-level autism.    13. Hypoalbuminemia: added Juven  14. Hypogmanesemia: supplement 2grams IV mag today and repeat magnesium level tomorrow. Start magnesium supplement 5034mgluconate HS.  15. Severe vitamin D deficiency: start high dose ergocalciferol 50,000U once weekly for 7 weeks.  16. Iron deficiency: supplement started    LOS: 8 days A FACE TO FACE EVALUATION WAS PERFORMED  Fidencio Duddy P Jaken Fregia 10/27/2020, 8:24 AM

## 2020-10-27 NOTE — Progress Notes (Signed)
Subjective: 60 year old male admitted to rehab following stroke with previous left foot incision drainage and debridement fourth metatarsal head resection placement graft performed with Dr. Cannon Kettle on 10/11/2020.  Podiatry was called given worsening of the wound and size.  Does get some pain of the foot.  No fevers or chills he reports.  Objective: AAO x3, NAD Graft, wound intact on the dorsal aspect of the third metatarsal phalangeal joint with staples intact.  Wound on the dorsal fourth MPJ with granular tissue as well as some fibrotic tissue.  There is no graft intact.  There is slight undermining proximally.  There is no probing to bone or tendon.  There is no erythema or warmth of the foot.  There is no drainage or pus identified today.  There is no fluctuance crepitation peer there is no malodor. No open lesions or pre-ulcerative lesions.  No pain with calf compression, swelling, warmth, erythema     Assessment: Left foot ulcerations  Plan: It does appear that the wound on the fourth metatarsal phalangeal joint dorsally has worsened some in regards to size.  Is no drainage or signs of infections however.  We will do repeat x-rays to ensure no osseous changes.  We will switch to using saline wet-to-dry on the dorsal fourth MPJ ulcer.  Can use a small amount of surgical lube on the graft site on the dorsal third MPJ and cover with 4 x 4's, Kerlix.    Celesta Gentile, DPM

## 2020-10-27 NOTE — Patient Care Conference (Signed)
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 Leeroy Cha Care Coodinator Present: Dorien Chihuahua, RN, BSN, CRRN;Becky Dupree, LCSW Nurse Present: Dorien Chihuahua, RN PT Present: Ginnie Smart, PT OT Present: Leretha Pol, OT SLP Present: Weston Anna, SLP PPS Coordinator present : Gunnar Fusi, SLP     Current Status/Progress Goal Weekly Team Focus  Bowel/Bladder   (P) Incontinent of b/B. LBM-10/26/20.  (P) Regain continence of B/B.  (P) Timed toileting, assist with toileting needs as needed.   Swallow/Nutrition/ Hydration             ADL's   max assist for LB ADLs and donning/doffing shirt overhead (this is his baseline per cousin); min-mod assist for self feeding UB bathing and toileting tasks, mod assist for sit<>stand, CGA for stand pivot toilet transfer using RW.  mod assist overall  Adaptive equipment/strategy training for self feeding, dressing, and bathing, functional transfer and dynamic standing training, dc planning   Mobility   MinA bed mobility, modA sit<>stand, CGA once standing for stand<>pivot transfer wtih RW. Gait up to ~6ft with CGA and RW. Initiated stairs but pt unable due to L foot pain and RLE weakness.  minA overall  Functional transfers, gait training,  general strengthening, stair training as able, pt education, DC planning   Communication   Mod A for communication, word finding, object naming, functional communication  supervision for comprehension, Min A for expression  word finding, answering yes/no questions   Safety/Cognition/ Behavioral Observations            Pain             Skin   (P) Ulcer to right ankle-foam intact, dressing intact to incision on left foot. Dressings performed as ordered.           Discharge Planning:  HOme with cousin and aide who he has M-F 5-7 hours. Cousin has no plans for him to go to a NH   Team Discussion: Note increased drainage on foot and pain. MD adjusted pain medication and added vitamins and minerals to address deficiencies. Continue to note incontinence with timed toileting which is new for the patient. Patient also reports abd pain but not constipated.  Patient on target to meet rehab goals: yes, currently min assist for bed mobility. Mod assist to get up to a rolling walker Min assist transfers and able to go 100' with CGA and a RW. Progress limited by right LE weakness, arthritis and contractures.  *See Care Plan and progress notes for long and short-term goals.   Revisions to Treatment Plan:  Step training   Teaching Needs: Safety, transfers, toileting, medications, skin care/wound care, etc.   Current Barriers to Discharge: Home enviroment access/layout, Incontinence, and Wound care  Possible Resolutions to Barriers: Family education Aide at home  PTA Lift chair at home to help with standing     Medical Summary Current Status: left foot wound, vitals stable, foot pain limiting therapy, hypokalemia, hypomagnesemia, severe vitamin D deficiency, abdominal pain  Barriers to Discharge: Medical stability;Wound care;Behavior  Barriers to Discharge Comments: left foot wound, vitals stable, foot pain limiting therapy, hypokalemia, hypomagnesemia, severe vitamin D deficiency, abdominal  pain Possible Resolutions to [employer] Focus: requested podiaty to eval wound today, continue daily wound care, continue to monitor HR and BP TID, supplement potassium, magnesium, vitamin D   Continued Need for Acute Rehabilitation Level of Care: The patient requires daily medical management by a physician with specialized training in physical medicine and rehabilitation for the following reasons: Direction of a multidisciplinary physical rehabilitation program to maximize functional independence : Yes Medical management of patient stability for increased activity during participation in an intensive rehabilitation regime.: Yes Analysis of laboratory values and/or radiology reports with any subsequent need for medication adjustment and/or medical intervention. : Yes   I attest that I was present, lead the team conference, and concur with the assessment and plan of the team.   Dorien Chihuahua B 10/27/2020, 3:30 PM

## 2020-10-27 NOTE — Progress Notes (Signed)
Speech Language Pathology Weekly Progress and Session Note  Patient Details  Name: Jonathan [name] MRN: [id number] Date of Birth: 05-18-1960  Beginning of progress report period: October 20, 2020 End of progress report period: October 27, 2020  Today's Date: 10/27/2020 SLP Individual Time: 1445-1530 SLP Individual Time Calculation (min): 45 min  Short Term Goals: Week 1: SLP Short Term Goal 1 (Week 1): Pt will respond to basic yes/no questions with 80 % accuracy given mod A visual/verbal cues. SLP Short Term Goal 2 (Week 1): Pt will name common objects with 80% accuracy given supervision A semantic and sentence completion cues. SLP Short Term Goal 3 (Week 1): Pt will able to express at phrase/simple sentence level with 80% accuracy syntax/content with min A multimodal cues. SLP Short Term Goal 4 (Week 1): Pt will demonstrate self-monitoring of verbal errors with max A multimodal cues. SLP Short Term Goal 5 (Week 1): Pt will follow 2 step commands with min A verbal/visual cues. SLP Short Term Goal 6 (Week 1): Pt will demonstrate reading comprehension at phrase level matching to objects in a field of 3 with 80% accuracy and min semantic and sentence completion cues.    New Short Term Goals: Week 2: SLP Short Term Goal 1 (Week 2): STGs=LTGs due to ELOS  Weekly Progress Updates: Patient demonstrates functional progress toward goals and had met 4 of 6 short-term goals this reporting period due to improvement in language function. Currently requiring mod A semantic and descriptive cues for verbal expression during structured language tasks. He continues to exhibit semantic paraphasias and perseveration on previously discussed items/topics with improving awareness of errors. Min A cues to initiate verbal expression. Patient is improving in responses to yes/no questions, and object matching at sentence level. Patient and family education is ongoing. Patient would benefit from continued skilled SLP  intervention to maximize cognitive-linguistic function and in order to maximize functional independence prior to discharge. Patient is progressing toward LTGs.     Intensity: Minumum of 1-2 x/day, 30 to 90 minutes Frequency: 3 to 5 out of 7 days Duration/Length of Stay: 7/8 Treatment/Interventions: Cognitive remediation/compensation;Cueing hierarchy;Functional tasks;Environmental controls;Internal/external aids;Speech/Language facilitation;Patient/family education   Daily Session Skilled Therapeutic Interventions: Skilled ST intervention performed with focus on language goals. Facilitated object naming of common objects with 62% accuracy without cues, and 90% accuracy given mod A semantic and/or phrase completion cues. Patient demonstrated good awareness of semantic paraphasias and perseveration during structured word finding tasks, and was able to repair with mod A semantic, phrase completion, and/or phonemic cues. Patient successfully followed 2-step commands with 80% accuracy and min A verbal cues involving occasional repetition and additional processing time. He demonstrated reading comprehension at phrase level by matching common objects within field of 3 with 100% accuracy at supervision level. Patient was left in chair with alarm activated and needs within reach. Continue per ST POC.  General    Pain Pain Assessment Pain Scale: 0-10 Pain Score: 0-No pain  Therapy/Group: Individual Therapy  Patty Sermons 10/27/2020, 4:01 PM

## 2020-10-28 ENCOUNTER — Inpatient Hospital Stay (HOSPITAL_COMMUNITY): Payer: Medicare Other

## 2020-10-28 MED ORDER — GABAPENTIN 100 MG PO CAPS
200.0000 mg | ORAL_CAPSULE | Freq: Three times a day (TID) | ORAL | Status: DC
  Administered 2020-10-28 – 2020-10-30 (×6): 200 mg via ORAL
  Filled 2020-10-28 (×6): qty 2

## 2020-10-28 MED ORDER — METOPROLOL SUCCINATE ER 25 MG PO TB24
12.5000 mg | ORAL_TABLET | Freq: Every evening | ORAL | Status: DC
  Administered 2020-10-28: 12.5 mg via ORAL
  Filled 2020-10-28: qty 1

## 2020-10-28 NOTE — Progress Notes (Signed)
PROGRESS NOTE   Subjective/Complaints: Appreciate podiatry eval today- wound increased in size but no signs of infection- new wound care recs have been placed in chart. Patient denies pain and pain is not limiting therapy- decrease gabapentin to 259m TID   ROS: Patient denies fever, rash, sore throat, blurred vision, nausea, vomiting, diarrhea, cough, shortness of breath or chest pain, joint or back pain, headache, or mood change, abdominal pain. Left foot pain is better controlled  Objective:   DG Foot Complete Left  Result Date: 10/28/2020 CLINICAL DATA:  Patient status post left foot surgery for osteomyelitis 10/11/2020 EXAM: LEFT FOOT - COMPLETE 3+ VIEW COMPARISON:  Plain films left foot 0 6/17/the plain films left foot and MRI left foot 10/09/2020. FINDINGS: The patient is status post resection fourth metatarsal at approximately the level of the mid diaphysis. Surgical staples are in place. No acute abnormality is identified. Medial deviation of the toes and erosive change at the first MTP joint and scattered interphalangeal joints are again seen. 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 The appearance is unchanged. IMPRESSION: Status post resection of the distal fourth metatarsal. No acute abnormality. 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 Electronically Signed   By: TInge RiseM.D.   On: 10/28/2020 10:19   No results for input(s): WBC, HGB, HCT, PLT in the last 72 hours.  Recent Labs    10/26/20 0546 10/27/20 0521  NA 135 133*  K 3.9 4.0  CL 105 103  CO2 24 25  GLUCOSE 97 104*  BUN 17 20  CREATININE 0.57* 0.44*  CALCIUM 9.6 9.8     Intake/Output Summary (Last 24 hours) at 10/28/2020 1130 Last data filed at 10/28/2020 1000 Gross per 24 hour  Intake 440 ml   Output --  Net 440 ml        Physical Exam: Vital Signs Blood pressure (!) 102/57, pulse 68, temperature 97.9 F (36.6 C), temperature source Oral, resp. rate 16, height _0  (1.6 m), weight 56.1 kg, SpO2 99 %. Gen: no distress, normal appearing HEENT: oral mucosa pink and moist, NCAT Cardio: Reg rate Chest: normal effort, normal rate of breathing Abd: soft, non-distended Ext: no edema Psych: pleasant, normal affect  Skin:    General: Skin is warm.    Comments:   left foot with dressing, staples present, mild serosanguinous dressing. Skin remains very dry. Wound appears to be increased in size with yellow-greenish drainage.   Neurological:    Mental Status: He is alert.    Comments: awake and alert. Follows commands. Dysconjugate gaze. Nystagmus with left lateral gaze more than right, right eye wanders with left gaze as well. P  Mild left lid ptosis.   Left facial weakness. Provides his name and age with some delay in processing and word finding.  .Marland KitchenRUE 4-/5. LUE 4+/5. RLE 3-4/5. LLE 3+/5 prox to distal, limited by wound on foot. Sensed pain in all 4's.---neuro exam stable 7/2 Musculoskeletal:        General: No swelling or tenderness.    Cervical back: Normal range of motion.  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 Functional deficits which require 3+ hours per day of interdisciplinary therapy in a comprehensive inpatient rehab setting. Physiatrist is providing close team supervision and 24 hour management of active medical problems listed below. Physiatrist and rehab team continue to assess barriers to discharge/monitor patient progress toward functional and medical goals  Care Tool:  Bathing    Body parts bathed by patient: Chest, Abdomen     Body parts n/a: Left lower leg   Bathing assist Assist Level: Moderate Assistance - Patient 50 - 74%     Upper Body Dressing/Undressing Upper body dressing   What is the patient  wearing?: Pull over shirt    Upper body assist Assist Level: Maximal Assistance - Patient 25 - 49%    Lower Body Dressing/Undressing Lower body dressing      What is the patient wearing?: Incontinence brief     Lower body assist Assist for lower body dressing: Moderate Assistance - Patient 50 - 74%     Toileting Toileting    Toileting assist Assist for toileting: Total Assistance - Patient < 25%     Transfers Chair/bed transfer  Transfers assist     Chair/bed transfer assist level: Moderate Assistance - Patient 50 - 74%     Locomotion Ambulation   Ambulation assist   Ambulation activity did not occur: Safety/medical concerns  Assist level: Minimal Assistance - Patient > 75% Assistive device: Walker-rolling Max distance: 9f   Walk 10 feet activity   Assist  Walk 10 feet activity did not occur: Safety/medical concerns  Assist level: Minimal Assistance - Patient > 75% Assistive device: Walker-rolling   Walk 50 feet activity   Assist Walk 50 feet with 2 turns activity did not occur: Safety/medical concerns  Assist level: Minimal Assistance - Patient > 75% Assistive device: Walker-rolling    Walk 150 feet activity   Assist Walk 150 feet activity did not occur: Safety/medical concerns         Walk 10 feet on uneven surface  activity   Assist Walk 10 feet on uneven surfaces activity did not occur: Safety/medical concerns         Wheelchair     Assist Will patient use wheelchair at discharge?: No   Wheelchair activity did not occur: N/A         Wheelchair 50 feet with 2 turns activity    Assist    Wheelchair 50 feet with 2 turns activity did not occur: N/A       Wheelchair 150 feet activity     Assist  Wheelchair 150 feet activity did not occur: N/A       Blood pressure (!) 102/57, pulse 68, temperature 97.9 F (36.6 C), temperature source Oral, resp. rate 16, height _0  (1.6 m), weight 56.1 kg, SpO2 99  %.    Medical Problem List and Plan: 1.  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  Impaired mobility, ambulating 140 feet -DVT/anticoagulation: Continue Lovenox             -  antiplatelet therapy: Aspirin 81 mg daily and Plavix 75 mg day x3 weeks then Plavix alone.  Recommendations of 30-day cardiac event monitor 3. Diffuse muscle aches 2/2 RA: Neurontin 300 mg twice daily.   -using kpad for low/mid back pain, using tylenol also with benefit  -resumed Arava, added voltaren gel.  4. Mood: Provide emotional support             -antipsychotic agents: N/A 5. Neuropsych: This patient is not capable of making decisions on his own behalf. 7. Fluids/Electrolytes/Nutrition: Routine in and outs with follow-up chemistries on admit 8.  Chronic left foot wound.  Status post left foot incision and drainage debridement of fourth met head resection 10/11/2020.   -Follow-up per Dr. Cannon Kettle podiatry services as outpt, contacted team given larger wound- appreciate their evaluation today.  -new wound care recs have been placed in chart -IV Ancef and Vibramycin course completed -continue daily dressing to foot -  Darco boot for gait/wb -denies pain: decrease Gabapentin to 269m TID 9.  Systolic congestive heart failure.  Follow-up cardiology service.  Weights stable- Continue Aldactone 25 mg daily.  Monitor for any signs of fluid overload             -weights trending down but I's/O's +,  eating 100%---question accuracy   Filed Weights   10/25/20 0500 10/27/20 0522 10/28/20 0437  Weight: 57.9 kg 55.1 kg 56.1 kg    10.  Hypertension/nonsustained VT.  D/c  Lisinopril-BP still soft/asymptomatic. Imdur 60 mg daily, decrease Toprol-XL to 12.5 mg daily.  Monitor with increased mobility 11.  RA.  Continue prednisone.  resumed Arava 12.  High-level autism.    13. Hypoalbuminemia: added Juven  14. Hypogmanesemia: supplement 2grams IV mag today and repeat magnesium level tomorrow. Start magnesium supplement [id number] HS.  15. Severe vitamin D deficiency: start high dose ergocalciferol 50,000U once weekly for 7 weeks.  16. Iron deficiency: supplement started    LOS: 9 days A FACE TO FACE EVALUATION WAS PERFORMED  Alyzabeth Pontillo P Delroy Ordway 10/28/2020, 11:30 AM

## 2020-10-28 NOTE — Progress Notes (Signed)
Subjective: 60 year old male admitted to rehab following stroke with previous left foot incision drainage and debridement fourth metatarsal head resection placement graft performed with Dr. Cannon Kettle on 10/11/2020.  Reports some pain to the foot but currently denies any pain as he had pain medication.  Denies any fevers or chills.  Objective: AAO x3, NAD Overall exam unchanged compared to last night.  Graft, wound intact on the dorsal aspect of the third metatarsal phalangeal joint with staples intact.  Wound on the dorsal fourth MPJ with granular tissue as well as some minimal fibrotic tissue.  There is no graft intact.  There is slight undermining proximally.  There is no probing to bone or tendon.  There is no erythema or warmth of the foot.  There is no drainage or pus identified today.  There is no fluctuance crepitation and there is no malodor. No pain with calf compression, swelling, warmth, erythema  Assessment: Left foot ulcerations  Plan: X-rays reviewed which revealed resection of the distal fourth metatarsal without any acute abnormality.  At this point continue dressing changes daily.  To the fourth dorsal MPJ wound recommend saline wet-to-dry on the third dorsal MPJ small amount of surgical lube followed by 4 x 4's of both the wounds, Kerlix.  Order to make an appoint to follow-up as an outpatient with Dr. Cannon Kettle next week as he is likely to be discharged on Friday. Order placed to have home health arranged upon discharge for dressing changes three times a week.   Celesta Gentile, DPM

## 2020-10-28 NOTE — Discharge Summary (Signed)
Physical Therapy Discharge Summary  Patient Details  Name: Jonathan [name] MRN: [id number] Date of Birth: 09-27-1960  Today's Date: 10/29/2020 PT Individual Time: 4098-1191 PT Individual Time Calculation (min): 45 min    Patient has met 7 of 8 long term goals due to improved activity tolerance, improved balance, improved postural control, increased strength, decreased pain, ability to compensate for deficits, and functional use of  right lower extremity.  Patient to discharge at an ambulatory level [place].   Patient's care partner is independent to provide the necessary physical and cognitive assistance at discharge.  Family training was scheduled for 10/29/2020 at 14:00 but no family present during this time.  OT notified this therapist that niece stated she needed to leave prior to PT/ST sessions despite schedule.  Reasons goals not met: Sit<>stand goal set to minA but pt requires modA for powering to rise from a standard height surface such as a w/c or lowered bed. Pt reports he uses a lift chair at home and has a hospital bed that raises to allow him to stand.   Recommendation:  Patient will benefit from ongoing skilled PT services in home health setting to continue to advance safe functional mobility, address ongoing impairments in global weakness and deconditioning, RLE strengthening, functional mobility, standing balance, and gait in order to reduce caregiver burden and minimize fall risk.  Equipment: Unable to provide w/c as pt received a power w/c in 2020. Recommend a RW  Reasons for discharge: treatment goals met and discharge from hospital  Patient/family agrees with progress made and goals achieved: Yes PT Discharge Precautions/Restrictions Precautions Precautions: Fall Restrictions Weight Bearing Restrictions: No LLE Weight Bearing: Weight bearing as tolerated Other Position/Activity Restrictions: in post-op shoe Vision/Perception     Cognition Overall Cognitive  Status: History of cognitive impairments - at baseline Arousal/Alertness: Awake/alert Orientation Level: Oriented X4 Attention: Focused;Sustained Focused Attention: Appears intact Focused Attention Impairment: Verbal basic;Functional basic Sustained Attention: Appears intact Sustained Attention Impairment: Verbal basic;Functional basic Memory: Impaired Memory Impairment: Storage deficit;Retrieval deficit;Decreased recall of new information Problem Solving: Impaired Problem Solving Impairment: Verbal basic;Functional basic Safety/Judgment: Appears intact Sensation Sensation Light Touch: Impaired by gross assessment Hot/Cold: Appears Intact Proprioception: Impaired by gross assessment Stereognosis: Appears Intact Additional Comments: Difficult to assess 2/2 cog deficits Coordination Gross Motor Movements are Fluid and Coordinated: No Fine Motor Movements are Fluid and Coordinated: No Coordination and Movement Description: Generalized weakness and deconditioning but this improved since date of evaluation. Limited joint movement by chronic RA Heel Shin Test: [facility] - limited due to hip weakness but no dyscoordination noted Motor  Motor Motor: Abnormal postural alignment and control;Hemiplegia Motor - Discharge Observations: Global weakness and deconditioning, mild R hemi  Mobility Bed Mobility Bed Mobility: Rolling Right;Rolling Left;Supine to Sit;Sit to Supine Rolling Right: Independent with assistive device Rolling Left: Independent with assistive device Supine to Sit: Supervision/Verbal cueing Sit to Supine: Supervision/Verbal cueing Transfers Transfers: Sit to Stand;Stand to Sit;Stand Pivot Transfers Sit to Stand: Moderate Assistance - Patient 50-74% Stand to Sit: Minimal Assistance - Patient > 75% Stand Pivot Transfers: Moderate Assistance - Patient 50 - 74% Stand Pivot Transfer Details: Verbal cues for safe use of DME/AE;Verbal cues for gait pattern;Verbal cues for  technique;Verbal cues for sequencing;Visual cues/gestures for sequencing;Visual cues/gestures for precautions/safety;Visual cues for safe use of DME/AE Transfer (Assistive device): Rolling walker Locomotion  Gait Ambulation: Yes Gait Assistance: Contact Guard/Touching assist Gait Distance (Feet): 102 Feet Assistive device: Rolling walker Gait Gait: Yes Gait Pattern: Impaired Gait Pattern: Step-to pattern;Decreased  step length - right;Decreased step length - left;Decreased stance time - left;Decreased hip/knee flexion - left;Decreased hip/knee flexion - right;Decreased dorsiflexion - left;Decreased dorsiflexion - right;Poor foot clearance - left;Poor foot clearance - right;Narrow base of support;Trunk flexed Gait velocity: decreased, <0.2 m/s indicative of household ambulator Stairs / Additional Locomotion Stairs: Yes Stairs Assistance: Moderate Assistance - Patient 50 - 74% Stair Management Technique: Two rails;Step to pattern Number of Stairs: 4 Height of Stairs: 6 Wheelchair Mobility Wheelchair Mobility: No  Trunk/Postural Assessment  Cervical Assessment Cervical Assessment: Exceptions to [facility] (forward head) Thoracic Assessment Thoracic Assessment: Exceptions to [facility] (rounded shoulders, kyphotic) Lumbar Assessment Lumbar Assessment: Exceptions to [facility] (posterior pelvic tilt) Postural Control Postural Control: Deficits on evaluation (delayed)  Balance Balance Balance Assessed: Yes Static Sitting Balance Static Sitting - Balance Support: Feet supported Static Sitting - Level of Assistance: 7: Independent Dynamic Sitting Balance Dynamic Sitting - Balance Support: Feet supported;During functional activity Dynamic Sitting - Level of Assistance: 5: Stand by assistance Static Standing Balance Static Standing - Balance Support: Bilateral upper extremity supported (with RW) Static Standing - Level of Assistance: 5: Stand by assistance Dynamic Standing Balance Dynamic Standing -  Balance Support: During functional activity;Bilateral upper extremity supported (with RW) Dynamic Standing - Level of Assistance: 4: Min assist Extremity Assessment  RLE Assessment RLE Assessment: Exceptions to [facility] Passive Range of Motion (PROM) Comments: Contracted R ankle to neutral ta 90deg. Knee flexion limited 90 deg General Strength Comments: Limited due to joint stiffness and contractures. Knee ext 4-/5, hip flex 3/5, ankle strength limited 2/2 joint contracture LLE Assessment LLE Assessment: Exceptions to [facility] General Strength Comments: Deferred testing L ankle 2/2 surgical history. Knee ext 4/5, hip flex 3/5  Callie Fielding, PT   Jonathan [name] 10/28/2020, 12:46 PM

## 2020-10-28 NOTE — Progress Notes (Signed)
Physical Therapy Session Note  Patient Details  Name: Jonathan [name] MRN: [id number] Date of Birth: [birth year]  Today's Date: 10/28/2020 PT Individual Time: 1000-1100 PT Individual Time Calculation (min): 60 min   Short Term Goals: Week 2:  PT Short Term Goal 1 (Week 2): STG = LTG due to ELOS  Skilled Therapeutic Interventions/Progress Updates:    Pt greeted supine in bed to start session. He's awake and agreeable to therapy. Reports mild L foot pain near his toes, but does not rate. Provided rest breaks and emotional support for management. He also reports he was incontinent of bowel and bladder but this required questioning to determine. When asked about his incontinence, he reports that this is baseline but this remains unclear. Required totalA for brief change and pericare at bed level. He is able to roll in bed with supervision. Had heavily saturated brief with bowel incontinence as well. After cleaning up, completed supine<>sit with supervision with HOB flat and no use of bed rails, although he did require extra time for completion. Scoots forward at EOB with supervision as well, again requiring extra time. Donned pajama pants with maxA while seated EOB, donned L DARCO shoe and R croc shoe with totalA. Sit<>stand from raised EOB with minA to RW and required assist for pulling pants over hips. Completed stand<>pivot transfer with minA from EOB to w/c with use of RW, demo's decreased LLE weight shift and difficulty producing adequate step with R foot during transfer (this has been ongoing). Stand>sit to w/c with minA for controlled lowering despite cues for eccentric control (likely limited due to chronic RA impacting knee ROM). Pt wheeled for time management to ortho rehab gym in w/c. Completed ambulatory (~66f) car transfer with CGA and RW for gait to the car and minA for completing actual car transfer - he required assist primarily just for managing BLE's into and out of the car. Car height set  to simulate mid-size SUV (pt reports his cousin drives a "big" car, but unable to describe. Pt will be getting a transport ride home, per CSW). Ambulated back to his w/c, ~166f with CGA and RW with good safety awareness for safety approach and ability to initiate controlled lowering. Wheeled to main rehab gym to practice stair training. He required modA for ascending 4 steps with 2 hand rails while stepping up with R foot 1st with step-to pattern. Attempted to initiate descent on same steps (6inches tall) but pt unable due to steepness of step, expressing fearfulness of falling. Resorted to smaller 3 inch steps where he required a very heavy modA for descent on 8 steps that were 3 inches tall, stepping down with R foot 1st with step-to pattern. Pt reports he never does stairs and doesn't intend to do these at home - again, he will be getting transport ride home due to this barrier. Pt returned to his room in w/c and remained seated with safety belt alarm on, soft call bell in reach, all needs met.  Therapy Documentation Precautions:  Precautions Precautions: Fall Required Braces or Orthoses: Other Brace Other Brace: post op shoe Restrictions Weight Bearing Restrictions: No LLE Weight Bearing: Weight bearing as tolerated Other Position/Activity Restrictions: in post-op shoe General:    Therapy/Group: Individual Therapy  ChAlger Simons/09/2020, 7:38 AM

## 2020-10-28 NOTE — Progress Notes (Signed)
Speech Language Pathology Daily Session Note  Patient Details  Name: Jonathan [name] MRN: [id number] Date of Birth: 12-20-1960  Today's Date: 10/28/2020 SLP Individual Time: 0800-0900 SLP Individual Time Calculation (min): 60 min  Short Term Goals: Week 2: SLP Short Term Goal 1 (Week 2): STGs=LTGs due to ELOS  Skilled Therapeutic Interventions:   Patient seen in AM for skilled ST session with focus on cognitive-linguistic goals. Patient reported he had not had breakfast yet and so SLP assisted him with setup assist. Patient demonstrated adequate problem solving with managing utensils and with SLP providing supervision to minA cues. He continues to not initiate requests for help but did promptly reply when asked. Patient is aware of discharge date "on Friday". He did require modA cues to demonstrate awareness to deficits, however this was also impacted by his expressive aphasia. At end of session patient spontaneously requested for SLP to "open the curtain" (meaning window blinds). Patient  continues to demonstrate steady progress with his cognitive-linguistic goals.   Pain Pain Assessment Pain Scale: 0-10 Faces Pain Scale: No hurt  Therapy/Group: Individual Therapy  Sonia Baller, MA, CCC-SLP Speech Therapy

## 2020-10-28 NOTE — Progress Notes (Signed)
Occupational Therapy Session Note  Patient Details  Name: Callaway Hailes MRN: [id number] Date of Birth: [birth year]  Today's Date: 10/28/2020 OT Individual Time: 1300-1400 OT Individual Time Calculation (min): 60 min    Short Term Goals: Week 2:  OT Short Term Goal 1 (Week 2): STGs=LTGs due to ELOS  Skilled Therapeutic Interventions/Progress Updates:    Pt sitting up in w/c, c/o 8/10 pain in left foot and 7/10 pain in bilateral hands.  Nurse made aware and entered for medication administration.  Pt stating "yeah" when asked if coban wrap to bilateral digits of hands helped and improved functional use.  Pt stating "yeah" when asked if he would like hands rewrapped with coban for improved joint support and functional grasp.  Sit to stand at [facility] with mod assist for initial power up then CGA to ambulate to bathroom and min assist for stand to sit at 3 in 1 commode.  Pt had continent episode of bowel.  Pericare requiring total assist in standing due to pt attempting but unable to reach secondary to stiffness/contracture of shoulders limiting extension and IR.  Pt completed clothing mgt with min assist.  Pt required increased time and min assist to power up from 3 in 1 commode.  CGA to ambulate back to chair.  Stand to sit with mod assist due to knee contractures needing control of descent to lower surface.  Soft call bell in reach, seat alarm on.  Therapy Documentation Precautions:  Precautions Precautions: Fall Required Braces or Orthoses: Other Brace Other Brace: post op shoe Restrictions Weight Bearing Restrictions: No LLE Weight Bearing: Weight bearing as tolerated Other Position/Activity Restrictions: in post-op shoe    Therapy/Group: Individual Therapy  Ezekiel Slocumb 10/28/2020, 1:45 PM

## 2020-10-28 NOTE — Discharge Summary (Signed)
Physician Discharge Summary  Patient ID: Jonathan [name] MRN: [id number] DOB/AGE: 1960/08/21 60 y.o.  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 EXAM: ABDOMEN - 1 VIEW COMPARISON:  None. FINDINGS: Tip and side port of the enteric tube below the diaphragm in the stomach. Mild gaseous gastric distension. There is excreted IV contrast within the renal collecting systems. Right upper quadrant surgical clips typical of cholecystectomy. IMPRESSION: Tip and side port of the enteric tube below the diaphragm in the stomach. Electronically Signed   By: Keith Rake M.D.   On: 10/14/2020 21:51   CT HEAD WO CONTRAST  Result Date: 10/15/2020 CLINICAL DATA:  Stroke follow-up. Postop percutaneous clot retrieval. EXAM: CT HEAD WITHOUT CONTRAST TECHNIQUE: Contiguous axial images were obtained from the base of the skull through the vertex without intravenous contrast. COMPARISON:  MRI head 10/15/2020.  CT head 10/14/2020 FINDINGS: Brain: Hypodensity in the left basal ganglia compatible with acute infarct. This involves primarily the head of the caudate and putamen. Small amount of hemorrhage in the putamen is best seen on gradient echo imaging. Small acute infarct also in the left temporal lobe. Infarct distribution similar to the recent MRI. Diffusion-weighted imaging also demonstrated small areas of infarct in the left temporoparietal lobe. No large volume hemorrhage. Ventricle size normal. Mild edema in the head of the  caudate with mass-effect on the left frontal horn. No midline shift. Vascular: Negative for hyperdense vessel Skull: Negative Sinuses/Orbits: Mucosal edema and air-fluid levels in the paranasal sinuses. Patient is intubated. 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 Minimal petechial hemorrhage in the left putamen as noted on gradient echo imaging. No large area of hemorrhage. Electronically Signed   By: Franchot Gallo M.D.   On: 10/15/2020 10:22   MR BRAIN WO CONTRAST  Result Date: 10/15/2020 CLINICAL DATA:  Stroke follow-up EXAM: MRI HEAD WITHOUT CONTRAST TECHNIQUE: Multiplanar, multiecho pulse sequences of the brain and surrounding structures were obtained without intravenous contrast. COMPARISON:  Head CT from yesterday FINDINGS: Brain: Confluent restricted diffusion in the left stratum and in the left MCA inferior division cortex and white matter. Petechial hemorrhage is seen at the level of the affected basal ganglia. No hydrocephalus, mass, or collection. Vascular: Preserved flow voids. Skull and upper cervical spine: Normal marrow signal Sinuses/Orbits: Nasopharyngeal fluid in the setting of intubation. Negative orbits. Other: Bilateral parotid ductal dilatation without superimposed acute inflammation. Right mastoid opacification. These findings are considered incidental to the presentation. IMPRESSION: Acute infarction in the left MCA territory affecting lenticulostriates and inferior division cortex/white matter. Mild petechial hemorrhage at the level of the left basal ganglia. Electronically Signed   By: Monte Fantasia M.D.   On: 10/15/2020 08:45   MR FOOT LEFT W WO CONTRAST  Result Date: 10/10/2020 CLINICAL DATA:  Ulcer between left second and third metatarsals, abnormal x-ray EXAM: MRI OF THE LEFT FOREFOOT WITHOUT AND WITH CONTRAST TECHNIQUE: Multiplanar, multisequence MR imaging of the left foot was performed both before and  after administration of intravenous contrast. CONTRAST:  7mL GADAVIST GADOBUTROL 1 MMOL/ML IV SOLN COMPARISON:  10/09/2020, 12/22/2018 FINDINGS:  Bones/Joint/Cartilage Evaluation is limited due to excessive patient motion throughout the evaluation. Stable marrow edema is seen at the base of the fifth metatarsal and surrounding the first metatarsophalangeal and interphalangeal joints, most consistent with reactive changes due to arthropathy. Edema is also seen within the head of the fourth metatarsal, immediately adjacent to an area of soft tissue inflammation and ulceration. There is mild associated enhancement. No evidence of cortical disruption or erosion. This area is suspicious for early osteomyelitis. Progressive degenerative changes are seen throughout the left foot. Progressive joint space narrowing and osteophyte formation within the midfoot, metatarsophalangeal joints, and throughout the interphalangeal joints. Ligaments Evaluation of the ligamentous structures is suboptimal due to field of view selection and patient motion during the exam. Muscles and Tendons Evaluation is limited due to patient motion and field of view selection. No gross tendinous disruption. Soft tissues There is soft tissue ulceration, edema, and enhancement in the plantar aspect of the forefoot at the level of the fourth metatarsal head. No fluid collection or abscess. Edema is seen within the musculature surrounding the second and third metatarsals, compatible with reactive changes. No fluid collection or abscess. IMPRESSION: 1. Limited study due to patient motion throughout the exam. 2. Soft tissue ulceration and enhancement within the forefoot at the level of the fourth metatarsal head, with underlying marrow edema and enhancement within the fourth metatarsal. No cortical disruption. Findings favor early osteomyelitis. 3. Reactive edema within the fifth metatarsal and first digit as above, similar to prior study, and likely related  to underlying degenerative change. 4. Nonspecific soft tissue edema within the musculature adjacent to the second and third metatarsals, likely reactive. No fluid collection or abscess. Electronically Signed   By: Randa Ngo M.D.   On: 10/10/2020 00:57   IR CT Head Ltd  Result Date: 10/15/2020 INDICATION: 60 year old male with past medical history significant for autism spectrum disorder, RA, systolic CHF, chronic venous stasis with chronic left foot ulcer and recent onychomycosis s/p debridement who was admitted to [facility] with left foot ulcer and osteomyelitis on Vanc and zosyn. He was found to have acute onset of aphasia and right-sided weakness. His last known well was 1 p.m. on 10/14/2020. Head CT showed no evidence of large acute territorial infarct or hemorrhage. CT angiogram of the head and neck showed a proximal left M2/MCA occlusion. CT perfusion showed a 49 mL penumbra without evidence of a core infarct. NIHSS at presentation was 26; baseline modified Rankin scale is 4 related to autism. No IV tPA administered as she was outside the window. He was transferred to [facility] for mechanical thrombectomy. EKG during transport was notable for ST Elevations. Discussed with Dr. Ellyn Hack from cardiology who believes the EKG changes are related to neurological process. Decision was made to proceed with mechanical thrombectomy. EXAM: ULTRASOUND-GUIDED VASCULAR ACCESS DIAGNOSTIC CEREBRAL ANGIOGRAM FLAT PANEL HEAD CT COMPARISON:  CT/CT angiogram of the head and neck October 14, 2020. MEDICATIONS: Not applicable. ANESTHESIA/SEDATION: The procedure was performed under general anesthesia. CONTRAST:  35 mL of Omnipaque 240 milligram/mL FLUOROSCOPY TIME:  Fluoroscopy Time: 24 minutes 42 seconds (541 mGy). COMPLICATIONS: None immediate. TECHNIQUE: Informed written consent was obtained from the patient after a thorough discussion of the procedural risks, benefits and alternatives. All questions were  addressed. Maximal Sterile Barrier Technique was utilized including caps, mask, sterile gowns, sterile gloves, sterile drape, hand hygiene and skin antiseptic. A timeout was performed prior to the initiation of the procedure. The right groin was prepped and draped  in the usual sterile fashion. Using a micropuncture kit and the modified Seldinger technique, access was gained to the right common femoral artery and an 8 French sheath was placed. Real-time ultrasound guidance was utilized for vascular access including the acquisition of a permanent ultrasound image documenting patency of the accessed vessel. Under fluoroscopy, a Zoom 88 guide catheter was navigated over a 6 [place] Berenstein 2 catheter and a 0.035" Terumo Glidewire into the aortic arch. The catheter was placed into the left common carotid artery and then advanced into the left internal carotid artery. The inner catheter was removed. Frontal and lateral angiograms of the head were obtained. FINDINGS: 1. Patent right common femoral artery with adequate caliber for vascular access. 2. Severe tortuosity of the cervical left ICA and left carotid siphon. 3. Occlusion of the proximal left M2/MCA posterior division branch. PROCEDURE: Under biplane roadmap, a zoom 55 aspiration catheter was navigated over a phenom 21 microcatheter and a Aristotle 14 microguidewire into the cavernous segment of the left ICA. The microcatheter was then navigated over the wire into the left M3/MCA posterior division branch. Then, a 4 x 40 mm solitaire stent retriever was deployed spanning the M2 segment. The device was allowed to intercalated with the clot for 4 minutes. The microcatheter was removed. The aspiration catheter was advanced to the level of occlusion and connected to a penumbra aspiration pump. The thrombectomy device and aspiration catheter were removed under constant aspiration. Left internal carotid artery angiograms with frontal and lateral views of the head showed  complete recanalization of the left MCA vascular tree. Flat panel CT of the head was obtained and post processed in a separate workstation with concurrent attending physician supervision. Selected images were sent to PACS. No evidence of hemorrhagic complication. A right common femoral artery angiogram in right anterior oblique view was obtained via sheath side port. Atherosclerotic changes of the external iliac and right common femoral artery without hemodynamically significant stenosis. The vessel has adequate caliber for closure device utilization. The femoral sheath was exchanged over the wire for an 8 [place] Angio-Seal which was utilized for access closure. Immediate hemostasis was achieved. IMPRESSION: Successful mechanical thrombectomy performed for treatment of a proximal left M2/MCA posterior division branch occlusion. One pass performed with combined aspiration and stent retriever with complete recanalization (TICI 3). No thromboembolic or hemorrhagic complication. PLAN: Transferred to ICU for continued care. Electronically Signed   By: Pedro Earls M.D.   On: 10/15/2020 13:52   IR US Guide Vasc Access Right  Result Date: 10/15/2020 INDICATION: 60 year old male with past medical history significant for autism spectrum disorder, RA, systolic CHF, chronic venous stasis with chronic left foot ulcer and recent onychomycosis s/p debridement who was admitted to [facility] with left foot ulcer and osteomyelitis on Vanc and zosyn. He was found to have acute onset of aphasia and right-sided weakness. His last known well was 1 p.m. on 10/14/2020. Head CT showed no evidence of large acute territorial infarct or hemorrhage. CT angiogram of the head and neck showed a proximal left M2/MCA occlusion. CT perfusion showed a 49 mL penumbra without evidence of a core infarct. NIHSS at presentation was 26; baseline modified Rankin scale is 4 related to autism. No IV tPA administered as she was  outside the window. He was transferred to [facility] for mechanical thrombectomy. EKG during transport was notable for ST Elevations. Discussed with Dr. Ellyn Hack from cardiology who believes the EKG changes are related to neurological process. Decision was  made to proceed with mechanical thrombectomy. EXAM: ULTRASOUND-GUIDED VASCULAR ACCESS DIAGNOSTIC CEREBRAL ANGIOGRAM FLAT PANEL HEAD CT COMPARISON:  CT/CT angiogram of the head and neck October 14, 2020. MEDICATIONS: Not applicable. ANESTHESIA/SEDATION: The procedure was performed under general anesthesia. CONTRAST:  35 mL of Omnipaque 240 milligram/mL FLUOROSCOPY TIME:  Fluoroscopy Time: 24 minutes 42 seconds (541 mGy). COMPLICATIONS: None immediate. TECHNIQUE: Informed written consent was obtained from the patient after a thorough discussion of the procedural risks, benefits and alternatives. All questions were addressed. Maximal Sterile Barrier Technique was utilized including caps, mask, sterile gowns, sterile gloves, sterile drape, hand hygiene and skin antiseptic. A timeout was performed prior to the initiation of the procedure. The right groin was prepped and draped in the usual sterile fashion. Using a micropuncture kit and the modified Seldinger technique, access was gained to the right common femoral artery and an 8 French sheath was placed. Real-time ultrasound guidance was utilized for vascular access including the acquisition of a permanent ultrasound image documenting patency of the accessed vessel. Under fluoroscopy, a Zoom 88 guide catheter was navigated over a 6 [place] Berenstein 2 catheter and a 0.035" Terumo Glidewire into the aortic arch. The catheter was placed into the left common carotid artery and then advanced into the left internal carotid artery. The inner catheter was removed. Frontal and lateral angiograms of the head were obtained. FINDINGS: 1. Patent right common femoral artery with adequate caliber for vascular access. 2. Severe  tortuosity of the cervical left ICA and left carotid siphon. 3. Occlusion of the proximal left M2/MCA posterior division branch. PROCEDURE: Under biplane roadmap, a zoom 55 aspiration catheter was navigated over a phenom 21 microcatheter and a Aristotle 14 microguidewire into the cavernous segment of the left ICA. The microcatheter was then navigated over the wire into the left M3/MCA posterior division branch. Then, a 4 x 40 mm solitaire stent retriever was deployed spanning the M2 segment. The device was allowed to intercalated with the clot for 4 minutes. The microcatheter was removed. The aspiration catheter was advanced to the level of occlusion and connected to a penumbra aspiration pump. The thrombectomy device and aspiration catheter were removed under constant aspiration. Left internal carotid artery angiograms with frontal and lateral views of the head showed complete recanalization of the left MCA vascular tree. Flat panel CT of the head was obtained and post processed in a separate workstation with concurrent attending physician supervision. Selected images were sent to PACS. No evidence of hemorrhagic complication. A right common femoral artery angiogram in right anterior oblique view was obtained via sheath side port. Atherosclerotic changes of the external iliac and right common femoral artery without hemodynamically significant stenosis. The vessel has adequate caliber for closure device utilization. The femoral sheath was exchanged over the wire for an 8 [place] Angio-Seal which was utilized for access closure. Immediate hemostasis was achieved. IMPRESSION: Successful mechanical thrombectomy performed for treatment of a proximal left M2/MCA posterior division branch occlusion. One pass performed with combined aspiration and stent retriever with complete recanalization (TICI 3). No thromboembolic or hemorrhagic complication. PLAN: Transferred to ICU for continued care. Electronically Signed   By:  Pedro Earls M.D.   On: 10/15/2020 13:52   DG CHEST PORT 1 VIEW  Result Date: 10/15/2020 CLINICAL DATA:  Respiratory failure EXAM: PORTABLE CHEST 1 VIEW COMPARISON:  09/26/2020 FINDINGS: The endotracheal tube is at the carina. Nasogastric tube extends into the upper abdomen beyond the margin of the examination. Lungs are clear. No pneumothorax or  pleural effusion. Cardiac size within normal limits. No acute bone abnormality. IMPRESSION: Endotracheal tube at the carina. Withdrawal by roughly 2 cm would better position the catheter within the distal trachea. Electronically Signed   By: Fidela Salisbury MD   On: 10/15/2020 01:55   DG Foot Complete Left  Result Date: 10/28/2020 CLINICAL DATA:  Patient status post left foot surgery for osteomyelitis 10/11/2020 EXAM: LEFT FOOT - COMPLETE 3+ VIEW COMPARISON:  Plain films left foot 0 6/17/the plain films left foot and MRI left foot 10/09/2020. FINDINGS: The patient is status post resection fourth metatarsal at approximately the level of the mid diaphysis. Surgical staples are in place. No acute abnormality is identified. Medial deviation of the toes and erosive change at the first MTP joint and scattered interphalangeal joints are again seen. 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 The appearance is unchanged. IMPRESSION: Status post resection of the distal fourth metatarsal. No acute abnormality. 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 Electronically Signed   By: Inge Rise M.D.   On: 10/28/2020 10:19   DG Foot Complete Left  Result Date: 10/09/2020 CLINICAL DATA:  Infection. Ulcer between left second and third metatarsals. EXAM: LEFT FOOT - COMPLETE 3+ VIEW COMPARISON:  Radiograph 12/21/2018 FINDINGS: Progressive lateral subluxation of the second through fourth digits from prior  exam. There is some irregularity of the second and third rays at the metatarsal phalangeal joints. Irregularity about the second metatarsal head has progressed from prior, no frank bony destruction. There is progressive joint space irregularity involving the first metatarsal phalangeal joint with decreased density at the articular surface. Small erosion at the first interphalangeal joint medially. Pes planus and chronic arthropathy of the midfoot with spurring and loss of normal Boehler's angle. Chronic tibial talar degenerative change. Site of wound is not well-defined by radiograph. There is no radiopaque foreign body. No tracking soft tissue air. IMPRESSION: 1. Articular irregularity of the first, second and third metatarsal phalangeal joints. This appears progressed at the first and second rays from 2020. Given the underlying chronic changes, recommend MRI for more detailed assessment. 2. Small erosion at the first interphalangeal joint. 3. 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 Electronically Signed   By: Keith Rake M.D.   On: 10/09/2020 20:55   VAS [place] ABI WITH/WO TBI  Result Date: 10/10/2020  LOWER EXTREMITY DOPPLER STUDY Patient Name:  PROSPER PAFF  Date of Exam:   10/10/2020 Medical Rec #: [id number]         Accession #:    [id number] Date of Birth: 1960-12-06        Patient Gender: M Patient Age:   59Y Exam Location:  [facility] Procedure:      VAS [place] ABI WITH/WO TBI Referring Phys: [id number] TITORYA STOVER --------------------------------------------------------------------------------  Indications: Ulceration, and acute osteomyelitis- LLE ankle & foot. High Risk Factors: Past history of smoking, coronary artery disease. Other Factors: CHF, chronic venous stasis, chronic venous ulcerations.  Vascular Interventions: Venous ablation surgeries. Comparison Study: Segmental Pressure 11/28/18 - WNL Performing Technologist: Rogelia Rohrer RVT, RDMS  Examination Guidelines: A  complete evaluation includes at minimum, Doppler waveform signals and systolic blood pressure reading at the level of bilateral brachial, anterior tibial, and posterior tibial arteries, when vessel segments are accessible. Bilateral testing is considered an integral part of a complete examination. Photoelectric Plethysmograph (PPG) waveforms and toe systolic pressure readings are included  as required and additional duplex testing as needed. Limited examinations for reoccurring indications may be performed as noted.  ABI Findings: +--------+------------------+-----+---------+--------+ Right   Rt Pressure (mmHg)IndexWaveform Comment  +--------+------------------+-----+---------+--------+ Brachial                       triphasicIV       +--------+------------------+-----+---------+--------+ PTA     140               1.28 triphasic         +--------+------------------+-----+---------+--------+ DP      145               1.33 triphasic         +--------+------------------+-----+---------+--------+ +--------+------------------+-----+---------+-------+ Left    Lt Pressure (mmHg)IndexWaveform Comment +--------+------------------+-----+---------+-------+ [id number]                    triphasic        +--------+------------------+-----+---------+-------+ PTA     146               1.34 triphasic        +--------+------------------+-----+---------+-------+ DP      140               1.28 triphasic        +--------+------------------+-----+---------+-------+ +-------+-----------+-----------+------------+------------+ ABI/TBIToday's ABIToday's TBIPrevious ABIPrevious TBI +-------+-----------+-----------+------------+------------+ Right  1.33                  0.98        0.79         +-------+-----------+-----------+------------+------------+ Left   1.34                  1.01        0.75         +-------+-----------+-----------+------------+------------+  Toes  bandaged on LLE.  Summary: BLE ABIs are elevated, likely due to vessel calcification. Doppler waveforms are triphasic throughout.  *See table(s) above for measurements and observations  Electronically signed by Harold Barban MD on 10/10/2020 at 11:35:27 PM.    Final    IR PERCUTANEOUS ART THROMBECTOMY/INFUSION INTRACRANIAL INC DIAG ANGIO  Result Date: 10/15/2020 INDICATION: 60 year old male with past medical history significant for autism spectrum disorder, RA, systolic CHF, chronic venous stasis with chronic left foot ulcer and recent onychomycosis s/p debridement who was admitted to [facility] with left foot ulcer and osteomyelitis on Vanc and zosyn. He was found to have acute onset of aphasia and right-sided weakness. His last known well was 1 p.m. on 10/14/2020. Head CT showed no evidence of large acute territorial infarct or hemorrhage. CT angiogram of the head and neck showed a proximal left M2/MCA occlusion. CT perfusion showed a 49 mL penumbra without evidence of a core infarct. NIHSS at presentation was 26; baseline modified Rankin scale is 4 related to autism. No IV tPA administered as she was outside the window. He was transferred to [facility] for mechanical thrombectomy. EKG during transport was notable for ST Elevations. Discussed with Dr. Ellyn Hack from cardiology who believes the EKG changes are related to neurological process. Decision was made to proceed with mechanical thrombectomy. EXAM: ULTRASOUND-GUIDED VASCULAR ACCESS DIAGNOSTIC CEREBRAL ANGIOGRAM FLAT PANEL HEAD CT COMPARISON:  CT/CT angiogram of the head and neck October 14, 2020. MEDICATIONS: Not applicable. ANESTHESIA/SEDATION: The procedure was performed under general anesthesia. CONTRAST:  35 mL of Omnipaque 240 milligram/mL FLUOROSCOPY TIME:  Fluoroscopy Time: 24 minutes 42 seconds (541 mGy). COMPLICATIONS: None immediate. TECHNIQUE: Informed written consent was  obtained from the patient after a thorough discussion of the  procedural risks, benefits and alternatives. All questions were addressed. Maximal Sterile Barrier Technique was utilized including caps, mask, sterile gowns, sterile gloves, sterile drape, hand hygiene and skin antiseptic. A timeout was performed prior to the initiation of the procedure. The right groin was prepped and draped in the usual sterile fashion. Using a micropuncture kit and the modified Seldinger technique, access was gained to the right common femoral artery and an 8 French sheath was placed. Real-time ultrasound guidance was utilized for vascular access including the acquisition of a permanent ultrasound image documenting patency of the accessed vessel. Under fluoroscopy, a Zoom 88 guide catheter was navigated over a 6 [place] Berenstein 2 catheter and a 0.035" Terumo Glidewire into the aortic arch. The catheter was placed into the left common carotid artery and then advanced into the left internal carotid artery. The inner catheter was removed. Frontal and lateral angiograms of the head were obtained. FINDINGS: 1. Patent right common femoral artery with adequate caliber for vascular access. 2. Severe tortuosity of the cervical left ICA and left carotid siphon. 3. Occlusion of the proximal left M2/MCA posterior division branch. PROCEDURE: Under biplane roadmap, a zoom 55 aspiration catheter was navigated over a phenom 21 microcatheter and a Aristotle 14 microguidewire into the cavernous segment of the left ICA. The microcatheter was then navigated over the wire into the left M3/MCA posterior division branch. Then, a 4 x 40 mm solitaire stent retriever was deployed spanning the M2 segment. The device was allowed to intercalated with the clot for 4 minutes. The microcatheter was removed. The aspiration catheter was advanced to the level of occlusion and connected to a penumbra aspiration pump. The thrombectomy device and aspiration catheter were removed under constant aspiration. Left internal carotid  artery angiograms with frontal and lateral views of the head showed complete recanalization of the left MCA vascular tree. Flat panel CT of the head was obtained and post processed in a separate workstation with concurrent attending physician supervision. Selected images were sent to PACS. No evidence of hemorrhagic complication. A right common femoral artery angiogram in right anterior oblique view was obtained via sheath side port. Atherosclerotic changes of the external iliac and right common femoral artery without hemodynamically significant stenosis. The vessel has adequate caliber for closure device utilization. The femoral sheath was exchanged over the wire for an 8 [place] Angio-Seal which was utilized for access closure. Immediate hemostasis was achieved. IMPRESSION: Successful mechanical thrombectomy performed for treatment of a proximal left M2/MCA posterior division branch occlusion. One pass performed with combined aspiration and stent retriever with complete recanalization (TICI 3). No thromboembolic or hemorrhagic complication. PLAN: Transferred to ICU for continued care. Electronically Signed   By: Pedro Earls M.D.   On: 10/15/2020 13:52   CT HEAD CODE STROKE WO CONTRAST`  Result Date: 10/14/2020 CLINICAL DATA:  Code stroke. Right facial droop. Right-sided weakness. EXAM: CT HEAD WITHOUT CONTRAST TECHNIQUE: Contiguous axial images were obtained from the base of the skull through the vertex without intravenous contrast. COMPARISON:  None. FINDINGS: Brain: Abnormal edema and loss of gray-white differentiation in the left basal ganglia (including the caudate and lentiform nucleus), concerning for acute or subacute infarct. Mild associated mass effect with approximately 2 mm of rightward midline shift at the foramen of Monro. Vascular: No hyperdense vessel identified. Calcific atherosclerosis. Skull: No acute fracture. Sinuses/Orbits: Clear visualized sinuses.  Unremarkable orbits.  Other: Small right mastoid effusion. ASPECTS [facility] Stroke  Program Early CT Score) - Ganglionic level infarction (caudate, lentiform nuclei, internal capsule, insula, M1-M3 cortex): 5 - Supraganglionic infarction (M4-M6 cortex): 3 Total score (0-10 with 10 being normal): 8 IMPRESSION: 1. Abnormal edema and loss of gray-white differentiation in the left basal ganglia (including the caudate and lentiform nucleus), concerning for acute or subacute infarct. Recommend MRI to confirm and exclude other etiologies. 2. Mild associated mass effect with approximately 2 mm of rightward midline shift at the foramen of Monro. 3. No acute hemorrhage. Findings discussed with Dr. Dwyane Dee via telephone at 5:18 p.m. Electronically Signed   By: Margaretha Sheffield MD   On: 10/14/2020 17:22   ECHOCARDIOGRAM LIMITED  Result Date: 10/15/2020    ECHOCARDIOGRAM LIMITED REPORT   Patient Name:   DAMAR PETIT Date of Exam: 10/15/2020 Medical Rec #:  [id number]        Height:       63.0 in Accession #:    [id number]       Weight:       138.9 lb Date of Birth:  [birth year]       BSA:          1.656 m Patient Age:    13 years         BP:           130/61 mmHg Patient Gender: M                HR:           78 bpm. Exam Location:  Inpatient Procedure: Limited Echo, Limited Color Doppler and Cardiac Doppler Indications:    stroke. coronary artery disease.  History:        Patient has prior history of Echocardiogram examinations, most                 recent 09/26/2020. CHF, CAD; Risk Factors:Dyslipidemia.  Sonographer:    Johny Chess Referring Phys: 94 Glynn  Sonographer Comments: Echo performed with patient supine and on artificial respirator. IMPRESSIONS  1. Akinesis of the inferior and inferolateral walls; remaining walls hypokinetic; overall moderate to severe LV dysfunction.  2. Left ventricular ejection fraction, by estimation, is 30 to 35%. The left ventricle has moderate to severely decreased function. The left ventricle  demonstrates regional wall motion abnormalities (see scoring diagram/findings for description). The left ventricular internal cavity size was mildly dilated. Left ventricular diastolic parameters are consistent with Grade II diastolic dysfunction (pseudonormalization). Elevated left atrial pressure.  3. Right ventricular systolic function is normal. The right ventricular size is normal.  4. The mitral valve is grossly normal. Trivial mitral valve regurgitation.  5. The aortic valve is tricuspid. Aortic valve regurgitation is moderate. Mild aortic valve sclerosis is present, with no evidence of aortic valve stenosis. FINDINGS  Left Ventricle: Left ventricular ejection fraction, by estimation, is 30 to 35%. The left ventricle has moderate to severely decreased function. The left ventricle demonstrates regional wall motion abnormalities. The left ventricular internal cavity size was mildly dilated. There is no left ventricular hypertrophy. Left ventricular diastolic parameters are consistent with Grade II diastolic dysfunction (pseudonormalization). Elevated left atrial pressure. Right Ventricle: The right ventricular size is normal. Right ventricular systolic function is normal. Left Atrium: Left atrial size was normal in size. Right Atrium: Right atrial size was normal in size. Pericardium: There is no evidence of pericardial effusion. Mitral Valve: The mitral valve is grossly normal. There is mild thickening of the mitral valve leaflet(s). Trivial mitral valve regurgitation. Tricuspid  Valve: The tricuspid valve is normal in structure. Tricuspid valve regurgitation is trivial. Aortic Valve: The aortic valve is tricuspid. Aortic valve regurgitation is moderate. Aortic regurgitation PHT measures 260 msec. Mild aortic valve sclerosis is present, with no evidence of aortic valve stenosis. Pulmonic Valve: The pulmonic valve was grossly normal. Pulmonic valve regurgitation is trivial. IAS/Shunts: The interatrial septum was  not well visualized. Additional Comments: Akinesis of the inferior and inferolateral walls; remaining walls hypokinetic; overall moderate to severe LV dysfunction. LEFT VENTRICLE PLAX 2D LVIDd:         5.40 cm  Diastology LVIDs:         5.00 cm  LV e' medial:    5.00 cm/s LV PW:         1.00 cm  LV E/e' medial:  26.2 LV IVS:        0.90 cm  LV e' lateral:   7.18 cm/s LVOT diam:     2.30 cm  LV E/e' lateral: 18.2 LV SV:         93 LV SV Index:   56 LVOT Area:     4.15 cm  IVC IVC diam: 1.90 cm LEFT ATRIUM         Index LA diam:    3.30 cm 1.99 cm/m  AORTIC VALVE LVOT Vmax:   125.00 cm/s LVOT Vmean:  78.400 cm/s LVOT VTI:    0.223 m AI PHT:      260 msec  AORTA Ao Root diam: 3.20 cm Ao Asc diam:  3.00 cm MITRAL VALVE MV Area (PHT): 4.60 cm     SHUNTS MV Decel Time: 165 msec     Systemic VTI:  0.22 m MV E velocity: 131.00 cm/s  Systemic Diam: 2.30 cm Kirk Ruths MD Electronically signed by Kirk Ruths MD Signature Date/Time: 10/15/2020/9:20:08 AM    Final    CT ANGIO HEAD NECK W WO CM W PERF (CODE STROKE)  Result Date: 10/14/2020 CLINICAL DATA:  Neuro deficit, acute stroke suspected. EXAM: CT ANGIOGRAPHY HEAD AND NECK CT PERFUSION BRAIN TECHNIQUE: Multidetector CT imaging of the head and neck was performed using the standard protocol during bolus administration of intravenous contrast. Multiplanar CT image reconstructions and MIPs were obtained to evaluate the vascular anatomy. Carotid stenosis measurements (when applicable) are obtained utilizing NASCET criteria, using the distal internal carotid diameter as the denominator. Multiphase CT imaging of the brain was performed following IV bolus contrast injection. Subsequent parametric perfusion maps were calculated using RAPID software. CONTRAST:  116mL OMNIPAQUE IOHEXOL 350 MG/ML SOLN COMPARISON:  None. FINDINGS: CTA NECK FINDINGS Aortic arch: Great vessel origins are patent. Mild narrowing of the left subclavian artery origin. Right carotid system: No  evidence of dissection, stenosis (50% or greater) or occlusion. Tortuous ICA with retropharyngeal course. Mild atherosclerosis at the carotid bifurcation. Left carotid system: No evidence of dissection, stenosis (50% or greater) or occlusion. Mild atherosclerosis at the carotid bifurcation. Vertebral arteries: Right dominant. The non dominant left vertebral artery is small throughout its course with small transverse foramen. Right vertebral artery is patent without significant (greater than 50%) stenosis. Skeleton: Other neck: Enlarged bilateral parotid ducts, left greater than right without definite radiodense calculus, although evaluation is limited on the CTA. Upper chest: Layering bilateral pleural effusions in the visualized lung apices with overlying ground-glass opacities Review of the MIP images confirms the above findings CTA HEAD FINDINGS Anterior circulation: Bilateral ICAs are patent with calcific atherosclerosis, but no significant (greater than 50%) stenosis. Left M1 MCAs  patent. Occlusion versus severe stenosis of a proximal left M2 MCA branch (see series 8, images 121 through 123; series 10, images 120 5 through 128) with irregular distal opacification. Right MCA and bilateral ACAs are patent. No aneurysm identified. Posterior circulation: Bilateral intradural vertebral arteries and the basilar artery are patent. Bilateral fetal type PCAs without evidence of proximal hemodynamically significant PCA stenosis. Limited evaluation of the distal PCAs due to venous contamination. No aneurysm identified. Venous sinuses: As permitted by contrast timing, patent. Anatomic variants: See above. Review of the MIP images confirms the above findings CT Brain Perfusion Findings: ASPECTS: 8 CBF (<30%) Volume: 66mL Perfusion (Tmax>6.0s) volume: 53mL Mismatch Volume: 32mL. The amount of penumbra may be underestimated given the more superior left MCA territory is not imaged on perfusion Infarction Location:None  identified IMPRESSION: 1. 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 Associated 49 mL of penumbra in the left MCA territory. The volume of penumbra may be underestimated given the more superior left MCA territory is not imaged on perfusion. No evidence of core infarct on RAPID analysis. 2. Layering bilateral pleural effusions in the visualized lung apices with overlying ground-glass opacities, potentially atelectasis. Recommend dedicated chest imaging to further characterize. 3. Significantly enlarged bilateral parotid ducts, left greater than right without definite radiodense calculus, although evaluation is limited on this CTA. Bilateral overlying subcutaneous stranding could represent anasarca, although parotiditis is not excluded. Recommend clinical correlation and consideration of follow-up CT neck (preferably with contrast) after resolution of the patient's emergent issues. Critical findings discussed with Dr. Quinn Axe At 5:44 p.m. via telephone. Electronically Signed   By: Margaretha Sheffield MD   On: 10/14/2020 18:14    Labs:  Basic Metabolic Panel: Recent Labs  Lab 10/26/20 0546 10/27/20 0521  NA 135 133*  K 3.9 4.0  CL 105 103  CO2 24 25  GLUCOSE 97 104*  BUN 17 20  CREATININE 0.57* 0.44*  CALCIUM 9.6 9.8  MG 1.6* 1.9    CBC: No results for input(s): WBC, NEUTROABS, HGB, HCT, MCV, PLT in the last 168 hours.  CBG: No results for input(s): GLUCAP in the last 168 hours.  Family history.  Mother with hypertension Father with heart failure.  Negative for colon cancer esophageal cancer or rectal cancer or stomach cancer  Brief HPI:   Bertha Earwood is a 60 y.o. right-handed male with history significant for autism spectrum disorder RA maintained on RV and prednisone, right knee and hip arthroplasty as well as left total hip arthroplasty 8032 systolic congestive heart failure chronic venous stasis with  chronic left foot ulcer with recent onchomycosis and debridement.  Patient with recent admission 09/26/2020 to 09/29/2020 for exacerbation of CHF.  Per chart review lives with cousin for the last 61 years and good family support.  Ambulates short distances with a cane.  Presented 10/09/2020 with ischemic changes of left foot maintained on vancomycin and Zosyn.  Patient underwent left foot incision and drainage with debridement fourth metatarsal head resection placement of graft 10/11/2020 per podiatry Dr. Cannon Kettle.  ABIs completed postoperatively showing normal triphasic waveforms in both lower extremities with some elevated pressure suggesting calcification of the vessels.  Vascular surgery Dr. Donnetta Hutching consulted showing no evidence of arterial disease no need for further follow-up at that time.  Patient did complete a 7-day course of IV Ancef for wound care as well as Vibramycin.  On 10/14/2020 patient with acute onset of right side weakness and aphasia.  Cranial CT scan showed abnormal edema and loss of gray-white differentiation in the left basal ganglia concerning for acute or subacute infarction.  No acute hemorrhage.  CT angiogram of the head showed occlusion versus severe stenosis of proximal left M2 MCA with irregular distal opacification.  Patient underwent diagnostic cerebral angiogram with mechanical thrombectomy 10/14/2020 per interventional radiology.  MRI of the brain showed acute infarction left MCA territory affecting lenticulostriate and inferior division cortex/white matter.  Mild petechial hemorrhage at the level of the left basal ganglia.  Echocardiogram with ejection fraction of 30 to 35%.  The left ventricle showed moderate to severe decreased function.  The left ventricle demonstrated regional wall motion abnormality grade 2 diastolic dysfunction.  Patient was cleared to begin aspirin and Plavix for CVA prophylaxis x3 weeks then Plavix alone.  Subcutaneous Lovenox for DVT prophylaxis.  Cardiology service  continue to follow for CHF Aldactone had been resumed.  Troponins mildly elevated 25-26-30 felt to be related to demand ischemia.  Therapy evaluations completed due to patient's right side weakness and aphasia was admitted for a comprehensive rehab program.   Hospital Course: Takari Lundahl was admitted to rehab 10/19/2020 for inpatient therapies to consist of PT, ST and OT at least three hours five days a week. Past admission physiatrist, therapy team and rehab RN have worked together to provide customized collaborative inpatient rehab.  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  Patient remained on aspirin and Plavix x3 weeks then Plavix alone per neurology services.  Subcutaneous Lovenox for DVT prophylaxis.  Pain management history of rheumatoid arthritis maintained on Neurontin.  Patients [facility] had been resumed.  In regards to patient's chronic left foot wound status post left foot incision and drainage debridement of fourth metatarsal head resection 10/11/2020 followed by podiatry services wound care as directed patient had completed course of IV Ancef Vibramycin.  Darco boot for gait weightbearing as tolerated.  Followed by cardiology services for systolic congestive heart failure no signs of fluid overload Aldactone as directed.  He did have a history of hypertension nonsustained VT lisinopril discontinued blood pressure somewhat soft remained on Imdur and Toprol.  Noted hypoalbuminemia with the addition of Juven.  Hypomagnesemia supplemented as directed.  Severe vitamin D deficiency started on vitamin D weekly.  Patient did have a high level of autism and lives with his cousin.   Blood pressures were monitored on TID basis and soft and monitored     Rehab course: During patient's stay in rehab weekly team conferences were held to monitor  patient's progress, set goals and discuss barriers to discharge. At admission, patient required minimal guard stand pivot transfers minimal assist 40 feet with crutches moderate assist sit to supine total assist upper body bathing total assist lower body bathing total assist upper lower body dressing  Physical exam.  Blood pressure 123/77 pulse 76 temperature 98.3 respiration 20 oxygen saturation 97% room air Constitutional.  No acute distress HEENT Head.  Normocephalic and atraumatic Eyes.  Pupils round and reactive to light no discharge without nystagmus Neck.  Supple nontender no JVD without thyromegaly Cardiac regular rate rhythm not any extra sounds or murmur heard Abdomen.  Soft nontender positive bowel sounds without rebound Respiratory effort normal no respiratory distress without wheeze Musculoskeletal.  No swelling or tenderness.  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  Heavy bulky dressing in place to left  foot staples present mild serosanguineous dressing Neurologic.  Awake alert makes eye contact with examiner disconjugate gaze.  Nystagmus with left lateral gaze right eye wanders with left gaze as well.  Patient wearing tape over the left lens of glasses.  Mild left lid ptosis.  Left facial weakness.  Provides name age and some delay in processing and word finding.  Limited insight and awareness.  He needed multiple cues for place.  Follows simple commands.  Right upper extremity 4 -/5.  Left upper extremity 4+/5.  Right lower extremity 3-4/5.  Left lower extremity 3+/5 proximal to distal Limited by wound on foot.  He/She  has had improvement in activity tolerance, balance, postural control as well as ability to compensate for deficits. He/She has had improvement in functional use RUE/LUE  and RLE/LLE as well as improvement in awareness.  Ambulates contact-guard assist.  Occupational Therapy intervention focused on sit to stand standing balance brushing teeth seated  wheelchair safety awareness sit to stand from wheelchair with moderate assist.  Currently requires moderate assist semantic and descriptive cues for verbal expression during structured language task.  Continues to exhibit semantic paraphasias and perseveration on previously discussed items.  It was discussed with family need for supervision for safety on discharge.  Full family teaching completed and plan discharged home       Disposition: Discharged to home    Diet: Carb modified  Special Instructions: No driving smoking or alcohol  Apply surgery lube and dry dressing to left graft site leave the Adaptic and Steri-Strips in place.  Just put the lube on top and then cover with dry dressing to the left foot once daily  Medications at discharge 1.  Tylenol as needed 2.  Aspirin 81 mg p.o. daily x5 days and stop 3.  Plavix 75 mg p.o. daily 4.  Voltaren gel 2 g 4 times daily 5.  Neurontin 300 mg p.o. 3 times daily 6.  Imdur 60 mg every evening 7. Arava 10 mg p.o. daily 8.  Magnesium gluconate 500 mg p.o. nightly 9.  Toprol-XL 25 mg daily 10.  Singulair 10 mg p.o. nightly 11.  Multivitamin daily 12.  Juven powder packet 1 packet twice daily 13.  Protonix 40 mg p.o. daily 14.  Prednisone 5 mg p.o. twice daily 15.  Aldactone 12.5 mg p.o. daily 16.  Vitamin D 50,000 units every 7 days 17.  Lasix 20 mg daily as needed swelling  30-35 minutes were spent completing discharge summary and discharge planning  Discharge Instructions     Ambulatory referral to Neurology   Complete by: As directed    An appointment is requested in approximately: 4 weeks left MCA infarction   Ambulatory referral to Physical Medicine Rehab   Complete by: As directed    Moderate complexity follow-up 1 to 2 weeks left MCA infarction        Follow-up Information     Raulkar, Clide Deutscher, MD Follow up.   Specialty: Physical Medicine and Rehabilitation Why: 02/05/21 please arrive at 11:20am for 11:40am  appointment Contact information: [address] [place] [address] [phone]         Landis Martins, DPM Follow up.   Specialty: Podiatry Why: Call for appointment Contact information: [address] [place] [address] [phone]         Minus Breeding, MD Follow up.   Specialty: Cardiology Why: Call for appointment Contact information: [place] [address] [phone]  Signed: Lavon Paganini Jasper 10/30/2020, 5:27 AM

## 2020-10-29 MED ORDER — LEFLUNOMIDE 10 MG PO TABS: 10.0000 mg | ORAL_TABLET | Freq: Every day | ORAL | 0 refills | Status: AC

## 2020-10-29 MED ORDER — ATORVASTATIN CALCIUM 80 MG PO TABS: 80.0000 mg | ORAL_TABLET | Freq: Every day | ORAL | 0 refills | Status: AC

## 2020-10-29 MED ORDER — METOPROLOL SUCCINATE ER 25 MG PO TB24: 12.5000 mg | ORAL_TABLET | Freq: Every evening | ORAL | 0 refills | Status: DC

## 2020-10-29 MED ORDER — GABAPENTIN 100 MG PO CAPS: 200.0000 mg | ORAL_CAPSULE | Freq: Three times a day (TID) | ORAL | 0 refills | Status: AC

## 2020-10-29 MED ORDER — DICLOFENAC SODIUM 1 % EX GEL: 2.0000 g | Freq: Four times a day (QID) | CUTANEOUS | 0 refills | Status: AC

## 2020-10-29 MED ORDER — SPIRONOLACTONE 25 MG PO TABS: 12.5000 mg | ORAL_TABLET | Freq: Every day | ORAL | 0 refills | Status: AC

## 2020-10-29 MED ORDER — MAGNESIUM GLUCONATE 500 MG PO TABS: 500.0000 mg | ORAL_TABLET | Freq: Every day | ORAL | 0 refills | Status: AC

## 2020-10-29 MED ORDER — CLOPIDOGREL BISULFATE 75 MG PO TABS: 75.0000 mg | ORAL_TABLET | Freq: Every day | ORAL | 0 refills | Status: AC

## 2020-10-29 MED ORDER — ISOSORBIDE MONONITRATE ER 60 MG PO TB24: 60.0000 mg | ORAL_TABLET | Freq: Every evening | ORAL | 0 refills | Status: AC

## 2020-10-29 MED ORDER — ASPIRIN 81 MG PO CHEW: CHEWABLE_TABLET | ORAL | Status: AC

## 2020-10-29 MED ORDER — MONTELUKAST SODIUM 10 MG PO TABS: 10.0000 mg | ORAL_TABLET | Freq: Every day | ORAL | 0 refills | Status: AC

## 2020-10-29 MED ORDER — PREDNISONE 5 MG PO TABS: 5.0000 mg | ORAL_TABLET | Freq: Two times a day (BID) | ORAL | 0 refills | Status: AC

## 2020-10-29 MED ORDER — VITAMIN D (ERGOCALCIFEROL) 1.25 MG (50000 UNIT) PO CAPS: 50000.0000 [IU] | ORAL_CAPSULE | ORAL | 0 refills | Status: AC

## 2020-10-29 MED ORDER — PANTOPRAZOLE SODIUM 40 MG PO TBEC: 40.0000 mg | DELAYED_RELEASE_TABLET | Freq: Every day | ORAL | 0 refills | Status: AC

## 2020-10-29 MED ORDER — TAB-A-VITE/IRON PO TABS: 1.0000 | ORAL_TABLET | Freq: Every day | ORAL | 0 refills | Status: AC

## 2020-10-29 MED ORDER — FUROSEMIDE 20 MG PO TABS: 20.0000 mg | ORAL_TABLET | Freq: Every day | ORAL | 0 refills | Status: AC | PRN

## 2020-10-29 NOTE — Progress Notes (Signed)
Patient ID: Jonathan [name], male   DOB: [birth year], 60 y.o.   MRN: [id number]  Contacted [name]-cousin to se reason left before therapies were over. She stated: " I have a lot of my plate and needed to be home." She stayed for 50 minutes for OT session. She feels she can manage pt along with his aide. Discussed 3 in 1 being delivered to the home and he will coming home non-emergency ambulance. Will schedule for 10:00 transport home. [name] aware pt is 24/7 care.

## 2020-10-29 NOTE — Progress Notes (Signed)
Pt resting quietly so far this shift. Incontinent care provided with rounds. Denies pain. No distress noted.

## 2020-10-29 NOTE — Progress Notes (Signed)
Occupational Therapy Discharge Summary  Patient Details  Name: Jonathan [name] MRN: [id number] Date of Birth: 28-Apr-1960  Today's Date: 10/29/2020 OT Individual Time: 1300-1400 OT Individual Time Calculation (min): 60 min    Patient has met 6 of 8 long term goals due to improved activity tolerance, improved balance, ability to compensate for deficits, and improved coordination.  Patient to discharge at overall Mod Assist level.  Patient's care partner is independent to provide the necessary physical and cognitive assistance at discharge.    Reasons goals not met: Joint laxity bilateral hands, joint contractures of bilateral shoulders, elbows, and ankles limits pts ability to utilize adaptive equipment successfully.  Recommendation:  Patient will benefit from ongoing skilled OT services in home health setting to continue to advance functional skills in the area of BADL.  Equipment: BSC, RW  Reasons for discharge: treatment goals met and discharge from hospital  Patient/family agrees with progress made and goals achieved: Yes  OT Discharge Pain Pain Assessment Pain Scale: 0-10 Faces Pain Scale: No hurt ADL ADL Eating: Dependent Where Assessed-Eating: Wheelchair Upper Body Dressing: Dependent Where Assessed-Upper Body Dressing: Wheelchair Lower Body Dressing: Dependent Where Assessed-Lower Body Dressing: Wheelchair Vision Baseline Vision/History: Wears glasses Wears Glasses: At all times Patient Visual Report: No change from baseline Perception  Perception: Within Functional Limits Praxis Praxis: Intact Cognition Overall Cognitive Status: History of cognitive impairments - at baseline Arousal/Alertness: Awake/alert Orientation Level: Oriented X4 Attention: Sustained Focused Attention: Appears intact Sustained Attention: Appears intact Memory: Impaired Memory Impairment: Decreased recall of new information;Storage deficit Awareness: Impaired Awareness Impairment:  Emergent impairment Problem Solving: Impaired Problem Solving Impairment: Verbal basic;Functional basic Safety/Judgment: Appears intact Sensation Sensation Light Touch: Impaired by gross assessment Hot/Cold: Appears Intact Proprioception: Impaired by gross assessment Stereognosis: Appears Intact Additional Comments: Difficult to assess 2/2 cog deficits Coordination Gross Motor Movements are Fluid and Coordinated: No Fine Motor Movements are Fluid and Coordinated: No Coordination and Movement Description: Generalized weakness and deconditioning but this improved since date of evaluation. Limited joint movement by chronic RA Finger Nose Finger Test: unable to test due to shoulder and elbow contractures on BUE Motor  Motor Motor: Abnormal postural alignment and control;Hemiplegia Motor - Discharge Observations: Global weakness and deconditioning, mild R hemi Mobility  Transfers Sit to Stand: Moderate Assistance - Patient 50-74% Stand to Sit: Minimal Assistance - Patient > 75%  Trunk/Postural Assessment  Cervical Assessment Cervical Assessment: Exceptions to [facility] Thoracic Assessment Thoracic Assessment: Exceptions to [facility] Lumbar Assessment Lumbar Assessment: Exceptions to [facility] Postural Control Postural Control: Deficits on evaluation  Balance Balance Balance Assessed: Yes Static Sitting Balance Static Sitting - Level of Assistance: 7: Independent Dynamic Sitting Balance Dynamic Sitting - Balance Support: Feet supported;During functional activity Dynamic Sitting - Level of Assistance: 5: Stand by assistance Static Standing Balance Static Standing - Balance Support: Bilateral upper extremity supported Static Standing - Level of Assistance: 5: Stand by assistance Dynamic Standing Balance Dynamic Standing - Balance Support: During functional activity;Bilateral upper extremity supported Dynamic Standing - Level of Assistance: 4: Min assist Extremity/Trunk Assessment RUE  Assessment Passive Range of Motion (PROM) Comments: Multiple joint contractures including bilateral shoulders, elbows, forearm, wrist.  Right hand significantly ulnarly deviated index-small (likely secondary to diagnosis of RA). Active Range of Motion (AROM) Comments: shoulder scaption 60 degrees, elbow ext/flex: 30/110; unable to achieve supination past 0 degrees. LUE Assessment Active Range of Motion (AROM) Comments: Left shoulder scaption 40 degrees, elbow ext/flex: 30/75; unable to achieve supination past 0 degrees   Jonathan [name] L Jonathan [name]  10/29/2020, 6:16 PM

## 2020-10-29 NOTE — Progress Notes (Signed)
PROGRESS NOTE   Subjective/Complaints: No complaints this morning Moving bowels regularly. Minimal pain in left foot- discussed maintaining current Gabapentin dosage and he is agreeable   ROS: Patient denies fever, rash, sore throat, blurred vision, nausea, vomiting, diarrhea, cough, shortness of breath or chest pain, joint or back pain, headache, or mood change, abdominal pain, constipation. Left foot pain is better controlled  Objective:   DG Foot Complete Left  Result Date: 10/28/2020 CLINICAL DATA:  Patient status post left foot surgery for osteomyelitis 10/11/2020 EXAM: LEFT FOOT - COMPLETE 3+ VIEW COMPARISON:  Plain films left foot 0 6/17/the plain films left foot and MRI left foot 10/09/2020. FINDINGS: The patient is status post resection fourth metatarsal at approximately the level of the mid diaphysis. Surgical staples are in place. No acute abnormality is identified. Medial deviation of the toes and erosive change at the first MTP joint and scattered interphalangeal joints are again seen. 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 The appearance is unchanged. IMPRESSION: Status post resection of the distal fourth metatarsal. No acute abnormality. 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 Electronically Signed   By: Inge Rise M.D.   On: 10/28/2020 10:19   No results for input(s): WBC, HGB, HCT, PLT in the last 72 hours.  Recent Labs    10/27/20 0521  NA 133*  K 4.0  CL 103  CO2 25  GLUCOSE 104*  BUN 20  CREATININE 0.44*  CALCIUM 9.8     Intake/Output Summary (Last 24 hours) at 10/29/2020 1207 Last data filed at 10/29/2020 1610 Gross per 24 hour  Intake 660 ml  Output --  Net 660 ml        Physical Exam: Vital Signs Blood pressure (!) 98/55, pulse 73,  temperature (!) 97.5 F (36.4 C), temperature source Oral, resp. rate 18, height _0  (1.6 m), weight 52.9 kg, SpO2 99 %. Gen: no distress, normal appearing HEENT: oral mucosa pink and moist, NCAT Cardio: Reg rate Chest: normal effort, normal rate of breathing Abd: soft, non-distended Ext: no edema Psych: pleasant, normal affect Skin:    General: Skin is warm.    Comments:   left foot with dressing, staples present, mild serosanguinous dressing. Skin remains very dry. Wound appears to be increased in size with yellow-greenish drainage.   Neurological:    Mental Status: He is alert.    Comments: awake and alert. Follows commands. Dysconjugate gaze. Nystagmus with left lateral gaze more than right, right eye wanders with left gaze as well. P  Mild left lid ptosis.   Left facial weakness. Provides his name and age with some delay in processing and word finding.  Marland Kitchen RUE 4-/5. LUE 4+/5. RLE 3-4/5. LLE 3+/5 prox to distal, limited by wound on foot. Sensed pain in all 4's.---neuro exam stable 7/2 Musculoskeletal:        General: No swelling or tenderness.    Cervical back: Normal range of motion.    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 Functional deficits which require 3+ hours per day of  interdisciplinary therapy in a comprehensive inpatient rehab setting. Physiatrist is providing close team supervision and 24 hour management of active medical problems listed below. Physiatrist and rehab team continue to assess barriers to discharge/monitor patient progress toward functional and medical goals  Care Tool:  Bathing    Body parts bathed by patient: Chest, Abdomen     Body parts n/a: Left lower leg   Bathing assist Assist Level: Moderate Assistance - Patient 50 - 74%     Upper Body Dressing/Undressing Upper body dressing   What is the patient wearing?: Pull over shirt    Upper body assist Assist Level: Maximal Assistance - Patient 25 - 49%     Lower Body Dressing/Undressing Lower body dressing      What is the patient wearing?: Incontinence brief     Lower body assist Assist for lower body dressing: Moderate Assistance - Patient 50 - 74%     Toileting Toileting    Toileting assist Assist for toileting: Moderate Assistance - Patient 50 - 74%     Transfers Chair/bed transfer  Transfers assist     Chair/bed transfer assist level: Minimal Assistance - Patient > 75%     Locomotion Ambulation   Ambulation assist   Ambulation activity did not occur: Safety/medical concerns  Assist level: Contact Guard/Touching assist Assistive device: Walker-rolling Max distance: 144f   Walk 10 feet activity   Assist  Walk 10 feet activity did not occur: Safety/medical concerns  Assist level: Contact Guard/Touching assist Assistive device: Walker-rolling   Walk 50 feet activity   Assist Walk 50 feet with 2 turns activity did not occur: Safety/medical concerns  Assist level: Contact Guard/Touching assist Assistive device: Walker-rolling    Walk 150 feet activity   Assist Walk 150 feet activity did not occur: Safety/medical concerns (fatigue)         Walk 10 feet on uneven surface  activity   Assist Walk 10 feet on uneven surfaces activity did not occur: Safety/medical concerns         Wheelchair     Assist Will patient use wheelchair at discharge?: No   Wheelchair activity did not occur: N/A         Wheelchair 50 feet with 2 turns activity    Assist    Wheelchair 50 feet with 2 turns activity did not occur: N/A       Wheelchair 150 feet activity     Assist  Wheelchair 150 feet activity did not occur: N/A       Blood pressure (!) 98/55, pulse 73, temperature (!) 97.5 F (36.4 C), temperature source Oral, resp. rate 18, height _0  (1.6 m), weight 52.9 kg, SpO2 99 %.    Medical Problem List and Plan: 1.  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  Impaired mobility, ambulating 30-140 feet: Continue Lovenox             -antiplatelet therapy: Aspirin 81 mg daily and Plavix 75 mg day x3 weeks then Plavix alone.  Recommendations of 30-day cardiac event  monitor 3. Diffuse muscle aches 2/2 RA: Neurontin 300 mg twice daily.   -using kpad for low/mid back pain, using tylenol also with benefit  -Continue Arava, added voltaren gel.  4. Mood: Provide emotional support             -antipsychotic agents: N/A 5. Neuropsych: This patient is not capable of making decisions on his own behalf. 7. Fluids/Electrolytes/Nutrition: Routine in and outs with follow-up chemistries on admit 8.  Chronic left foot wound.  Status post left foot incision and drainage debridement of fourth met head resection 10/11/2020.   -Follow-up per Dr. Cannon [place] podiatry services as outpt, contacted team given larger wound- appreciate their evaluation today.  -new wound care recs have been placed in chart. Appreciate podiatry setting up home care wound dressing services for patient.  -IV Ancef and Vibramycin course completed -continue daily dressing to foot -  Darco boot for gait/wb -denies pain: decrease Gabapentin to 261m TID 9.  Systolic congestive heart failure.  Follow-up cardiology service.  Weights stable- Continue Aldactone 25 mg daily.  Monitor for any signs of fluid overload             -weights trending down but I's/O's +,  eating 100%---question accuracy   Filed Weights   10/27/20 0522 10/28/20 0437 10/29/20 0502  Weight: 55.1 kg 56.1 kg 52.9 kg    10.  Hypertension/nonsustained VT: now hypotensive: D/c Lisinopril-BP still  soft/asymptomatic. Imdur 60 mg daily, stop toprol. Monitor with increased mobility 11.  RA.  Continue prednisone.  resumed Arava 12.  High-level autism.    13. Hypoalbuminemia: continue Juven  14. Hypogmanesemia: supplement 2grams IV mag today and repeat magnesium level tomorrow. Start magnesium supplement 5040mgluconate HS.  15. Severe vitamin D deficiency: start high dose ergocalciferol 50,000U once weekly for 7 weeks.  16. Iron deficiency: supplement started    LOS: 10 days A FACE TO FACE EVALUATION WAS PERFORMED  KrMartha Clan Lanaysia Fritchman 10/29/2020, 12:07 PM

## 2020-10-29 NOTE — Progress Notes (Signed)
Physical Therapy Session Note  Patient Details  Name: Jonathan [name] MRN: [id number] Date of Birth: Aug 02, 1960  Today's Date: 10/29/2020 PT Individual Time: 0800-0830 and [id number] PT Individual Time Calculation (min): 30 min and 45 min  Short Term Goals: Week 1:  PT Short Term Goal 1 (Week 1): Pt will complete bed mobility with minA PT Short Term Goal 1 - Progress (Week 1): Met PT Short Term Goal 2 (Week 1): Pt will complete sit<>stand transfer with modA and LRAD PT Short Term Goal 2 - Progress (Week 1): Met PT Short Term Goal 3 (Week 1): Pt will complete bed<>chair transfer with modA and LRAD PT Short Term Goal 3 - Progress (Week 1): Met PT Short Term Goal 4 (Week 1): Pt will ambulate 56f with modA and LRAD PT Short Term Goal 4 - Progress (Week 1): Met Week 2:  PT Short Term Goal 1 (Week 2): STG = LTG due to ELOS Week 3:     Skilled Therapeutic Interventions/Progress Updates:  AM SESSION Pain:  Pt reports bilt feet  pain.  Treatment to tolerance.  Rest breaks and repositioning as needed. Darco shoe utilized for all wbing activities.  Pt initially supine and agreeable to treatment session w/focus on am ADLs, general mobility. Pt able to lift feet for therapist to thread pants and therapist raises to hips, pt unable to grasp pants. Supine to sit w/additional time, supervision, use of bed features. Therapist dons shoes, pt maintains sitting balance during this. Sit to stand from bed w/bed slightly elevated and cga. stand pivot transfer to wc w/RW and cga, slow turning, additional time  From wc level at sink, pt brushes teeth w/set up assist, washes face total assist.  Sit to stand from wc w/mod assist. Gait 581fw/RW, close supervision, shuffling gait, very poor clearance bilat.  At end of session, pt left oob in wc w/alarm belt set and needs in reach     PM SESSION Pain:  Pt reports feet pain, no number given, states aching in nature.  Treatment to tolerance.  Rest breaks  and repositioning as needed. Pt scheduled for family training in preparation for DC next date.  No family present for entire session. Pt in wc, pt transported to gym for continued session.  Sit to stand from wc w/mod assist. Gait 7562f 2 w/RW, cga to close supervision, shuffling flexed gait w/poor clearance but no loss of balance. Stairs: ascended/descended 4 stairs w/2 rails w/mod assist of 1, additional time, cues for sequencing, step to pattern. Pt transported to room at end of session.  Pt left oob in wc w/alarm belt set and needs in reach   Therapy Documentation Precautions:  Precautions Precautions: Fall Required Braces or Orthoses: Other Brace Other Brace: post op shoe Restrictions Weight Bearing Restrictions: No LLE Weight Bearing: Weight bearing as tolerated Other Position/Activity Restrictions: in post-op shoe    Therapy/Group: Individual Therapy BarCallie FieldingT [place]7/2022, 3:54 PM

## 2020-10-29 NOTE — Progress Notes (Signed)
Inpatient Rehabilitation Care Coordinator Discharge Note  The overall goal for the admission was met for:   Discharge location: Yes-HOME WITH COUSIN AND HER HUSBAND  Length of Stay: Yes-11 DAYS  Discharge activity level: Yes-CGA LEVEL  Home/community participation: Yes  Services provided included: MD, RD, PT, OT, SLP, RN, CM, Pharmacy, and SW  Financial Services: Medicare and Medicaid  Choices offered to/list presented to:pt and cousin  Follow-up services arranged: Home Health: [facility]-PT,OT,SP,RN, DME: [facility] [place] 3 IN 1, and Patient/Family has no preference for HH/DME agencies RESUME [facility]NA FOR 40 HOURS PER WEEK  Comments (or additional information):COUSIN ONLY CAME FOR 50 MINUTES TO DO FAMILY EDUCATION SAID HAD MUCH TO DO [place]. PT IS MORE CARE THAN HE WAS PRIOR TO ADMISSION AND REQUIRES 24/7 CARE NOW.   Patient/Family verbalized understanding of follow-up arrangements: Yes  Individual responsible for coordination of the follow-up plan: [name]-COUSIN [phone]  Confirmed correct DME delivered: Elease Hashimoto 10/29/2020    Dante Cooter, [place]

## 2020-10-29 NOTE — Progress Notes (Signed)
Speech Language Pathology Discharge Summary  Patient Details  Name: Jonathan [name] MRN: [id number] Date of Birth: 1960/12/23  Today's Date: 10/29/2020 SLP Individual Time: 48-1425 SLP Individual Time Calculation (min): 40 min   Skilled Therapeutic Interventions:  Patient seen for skilled ST session with original plan for family education with patient's cousin who also helps care for him. Unfortunately, cousin was apparently only able to stay for firs therapy (OT). SLP instead worked directly with patient on cognitive goals and education regarding discharge home tomorrow. Patient benefit from [place] cues for open-ended questions related to recent and upcoming events (discharge home next date). Patient able to spontaneously request at phrase level to have shade drawn. Patient continues to struggle with describing function of objects, etc. But is able to do so with modA, at times mod-maxA.    Patient has met 4 of 5 long term goals.  Patient to discharge at overall Supervision;Min;Mod level.  Reasons goals not met: patient requiring min-mod cues for word finding/expressive language   Clinical Impression/Discharge Summary: Patient made good progress and met 4/5 LTG's related to cognitive and lingusitic function. Patient with premorbid intellectual disability and difficult to determine how far off he is from baseline functioning at time of discharge. Patient continues to require min-mod A cues for word finding, describing and answering open-ended questions. He has improved with initiation, communication of wants/needs, awareness and problem solving. SLP is recommending [facility] SLP services upon discharge home with cousin.  Care Partner:  Caregiver Able to Provide Assistance: Yes  Type of Caregiver Assistance: Physical;Cognitive  Recommendation:  Home Health SLP;24 hour supervision/assistance  Rationale for SLP Follow Up: Maximize functional communication;Maximize cognitive function and  independence;Reduce caregiver burden   Equipment: None for ST   Reasons for discharge: Discharged from hospital   Patient/Family Agrees with Progress Made and Goals Achieved: Yes  Sonia Baller, MA, CCC-SLP Speech Therapy

## 2020-10-30 NOTE — Progress Notes (Signed)
Patient discharged off unit via [facility]. Patient belongings were sent home except bedside commode and one other bag and have been given to social work for family to come pick up. Education given to family by PA via phone. Dressing change done, Patient A&Ox4, no complications at this time, no pain.  Milford Cage

## 2020-10-30 NOTE — Progress Notes (Signed)
Slept well throughout the night. Incontinent care provided x2 despite timed toileting. Given tylenol at hs for reports of foot pain-effective.

## 2020-11-02 ENCOUNTER — Ambulatory Visit: Payer: Medicare Other | Admitting: Podiatry

## 2020-11-02 NOTE — Plan of Care (Signed)
Incontinent of bowel and bladder; goal not met 

## 2020-11-03 ENCOUNTER — Telehealth: Payer: Self-pay | Admitting: *Deleted

## 2020-11-03 NOTE — Telephone Encounter (Signed)
Patient was discharged from CIR to home with cousin Marylen Ponto.  Patient requires 24/7 care.  Attempted to contact Ms. Brigitte Pulse to obtain address to have 30 day Cardiac Event Monitor and Instructions shipped to her home.  No answer and no voice mail set up.

## 2020-11-05 ENCOUNTER — Encounter: Payer: Medicare Other | Admitting: Sports Medicine

## 2020-11-11 ENCOUNTER — Telehealth: Payer: Self-pay | Admitting: *Deleted

## 2020-11-11 ENCOUNTER — Encounter: Payer: Self-pay | Admitting: *Deleted

## 2020-11-11 NOTE — Telephone Encounter (Signed)
Verified address to ship [employer] 30 day cardiac event monitor and letter with instructions.  Ms. Docia Furl in care of [facility] [address] [place], [place]  [id number]

## 2020-11-12 ENCOUNTER — Telehealth: Payer: Self-pay | Admitting: *Deleted

## 2020-11-12 NOTE — Telephone Encounter (Signed)
Nurse w/ [facility] [phone] calling to let the physician know that patient's family is not cleaning the wound daily as ordered. They are only going out two times weekly.Please advise.

## 2020-11-13 ENCOUNTER — Telehealth: Payer: Self-pay | Admitting: Sports Medicine

## 2020-11-13 NOTE — Telephone Encounter (Signed)
A nurse from home health called about Jonathan [name] wanting to know about his left foot wound care orders and verify his F/U appointments.   Call back number [phone]

## 2020-11-14 ENCOUNTER — Ambulatory Visit (INDEPENDENT_AMBULATORY_CARE_PROVIDER_SITE_OTHER): Payer: Medicare Other

## 2020-11-14 DIAGNOSIS — I639 Cerebral infarction, unspecified: Secondary | ICD-10-CM

## 2020-11-16 ENCOUNTER — Telehealth: Payer: Self-pay

## 2020-11-16 NOTE — Telephone Encounter (Signed)
Received a critical monitor showing sinus rhythm with a run of [place] (13 beats) on 11/16/20 at 1:46 am Monitor report reviewed by Dr. Claiborne Billings (DOD) who recommended pt increase metoprolol to 25 mg daily and schedule follow up appointment with Dr. Percival Spanish.  Nurse attempted to contact number listed in chart. Unable to leave message as mailbox not set up.

## 2020-11-18 NOTE — Telephone Encounter (Signed)
[facility] home health states that Mr. Piehl foot is getting worse. I advise that they need to get in as soon as possible. [place] stated that she never received wound care order and Mr. Sieg does not have a graft on his foot.

## 2020-11-19 ENCOUNTER — Ambulatory Visit (INDEPENDENT_AMBULATORY_CARE_PROVIDER_SITE_OTHER): Payer: Medicare Other

## 2020-11-19 ENCOUNTER — Encounter: Payer: Self-pay | Admitting: Sports Medicine

## 2020-11-19 ENCOUNTER — Ambulatory Visit (INDEPENDENT_AMBULATORY_CARE_PROVIDER_SITE_OTHER): Payer: Medicare Other | Admitting: Sports Medicine

## 2020-11-19 ENCOUNTER — Other Ambulatory Visit: Payer: Self-pay

## 2020-11-19 DIAGNOSIS — L97522 Non-pressure chronic ulcer of other part of left foot with fat layer exposed: Secondary | ICD-10-CM

## 2020-11-19 DIAGNOSIS — I739 Peripheral vascular disease, unspecified: Secondary | ICD-10-CM

## 2020-11-19 DIAGNOSIS — I872 Venous insufficiency (chronic) (peripheral): Secondary | ICD-10-CM

## 2020-11-19 DIAGNOSIS — M05771 Rheumatoid arthritis with rheumatoid factor of right ankle and foot without organ or systems involvement: Secondary | ICD-10-CM

## 2020-11-19 MED ORDER — METOPROLOL SUCCINATE ER 25 MG PO TB24
25.0000 mg | ORAL_TABLET | Freq: Every evening | ORAL | 0 refills | Status: AC
Start: 2020-11-19 — End: ?

## 2020-11-19 NOTE — Progress Notes (Signed)
Subjective: Jonathan [name] is a 60 y.o. male patient seen today in office for POV #1 (DOS 10-11-20), S/P left foot wound debridement with removal of fourth metatarsal head and application of graft.  Patient admits pain to the left foot and reports that he is still on antibiotics and has home nursing.  No other issues noted.   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   Endotracheal tube present    Ischemic cardiomyopathy    Abnormal finding on EKG    Ischemic stroke ([place]) 10/14/2020   Cerebral infarction involving left middle cerebral artery (HCC)    Acute osteomyelitis of ankle and foot ([place]) 10/10/2020   Chronic foot ulcer with fat layer exposed, left ([place]) 10/10/2020   Foot ulcer with fat layer exposed, left ([place]) 10/09/2020   Acute hypoxemic respiratory failure ([place]) 10/07/2020   Elevated troponin    CHF, acute on chronic ([place]) 09/26/2020   Chest pain in adult 09/26/2020   Venous ulcer of left lower extremity with varicose veins ([place]) 09/22/2020   Acute diffuse otitis externa of left ear 03/07/2020   Chronic total occlusion of coronary artery 12/26/2019   Coronary artery disease involving native coronary artery of native heart 12/26/2019   Fall at home 12/26/2019   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[place]) 12/26/2019   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[place]) 12/09/2019   Physical debility 11/21/2019   Syncope 11/21/2019   DDD (degenerative disc disease), lumbar 09/27/2019   Open wound of left foot 09/27/2019   Open wound of right foot 09/27/2019   Pain due to onychomycosis of toenails of both feet 08/28/2019   Acute on chronic HFrEF (heart failure with reduced ejection fraction) ([place]) 08/22/2019   Chronic pain syndrome 08/22/2019   Rheumatoid arthritis ([place]) 08/22/2019   Venous stasis ulcers ([place]) 08/22/2019   Anemia, unspecified 08/22/2019   NSTEMI  (non-ST elevated myocardial infarction) ([place]) 08/21/2019   Cervical spondylosis without myelopathy 08/21/2019   Lumbar facet arthropathy 08/14/2019   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   B12 deficiency 02/08/2019   Glucose intolerance (impaired glucose tolerance) 02/08/2019   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   Hyperglycemia 12/27/2018   Cellulitis 12/27/2018   Wound drainage 12/22/2018   Foot ulcer ([place]) 12/21/2018   Encounter for screening for HIV 12/21/2018   Arthritis 08/22/2018   HLD (hyperlipidemia) 08/22/2018   Lung nodule 08/22/2018   Renal cyst, right 08/22/2018   Rheumatoid arthritis involving multiple sites with positive rheumatoid factor ([place]) 08/22/2018   Venous insufficiency 08/22/2018   Vitamin D deficiency 08/22/2018   Seasonal allergic rhinitis 07/16/2018   Essential hypertension 02/23/2018   Insomnia 02/23/2018   Tinea versicolor 02/23/2018   Antalgic gait 09/21/2017   Chronic pain of both knees 09/21/2017   Foot pain, bilateral 09/21/2017   Elevated liver enzymes 07/10/2017   Leg edema 06/08/2017   Primary osteoarthritis, left ankle and foot 05/09/2017   Post-traumatic osteoarthritis, right ankle and foot 05/09/2017   Primary osteoarthritis, right ankle and foot 05/09/2017   Erectile dysfunction 02/15/2017   Excessive sweating 02/15/2017   Difficulty transferring 01/27/2017   Acquired bilateral flat feet 11/15/2016   Other acquired hammer toe 11/15/2016   Erosive gastritis 09/05/2016   At high risk for falls 08/15/2016   Chronic bilateral low back pain without sciatica 10/13/2015   Drug therapy 08/03/2015   Rheumatoid arthritis involving left hip ([place]) 01/29/2015   Status post total replacement of left hip  01/29/2015   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 30 tablet 0   aspirin 81 MG chewable tablet Continue for 5 days and stop     atorvastatin (LIPITOR) 80 MG tablet Take 1 tablet (80 mg total) by mouth daily. 30 tablet 0   cetirizine (ZYRTEC) 10 MG tablet Take 10 mg by mouth daily as needed for allergies.     clopidogrel (PLAVIX) 75 MG tablet Take 1 tablet (75 mg total) by mouth daily. 30 tablet 0   diclofenac Sodium (VOLTAREN) 1 % GEL Apply 2 g topically 4 (four) times daily. 2 g 0   docusate sodium (COLACE) 100 MG capsule Take 1 capsule (100 mg total) by mouth 2 (two) times daily. 30 capsule 0   doxycycline (VIBRA-TABS) 100 MG tablet Take 100 mg by mouth 2 (two) times daily.     fluticasone (FLONASE) 50 MCG/ACT nasal spray Place 1 spray into the nose daily as needed for allergies.     furosemide (LASIX) 20 MG tablet Take 1 tablet (20 mg total) by mouth daily as needed. 5 tablet 0   gabapentin (NEURONTIN) 100 MG capsule Take 2 capsules (200 mg total) by mouth 3 (three) times daily. 90 capsule 0   isosorbide mononitrate (IMDUR) 60 MG 24 hr tablet Take 1 tablet (60 mg total) by mouth every evening. 30 tablet 0   leflunomide (ARAVA) 10 MG tablet Take 1 tablet (10 mg total) by mouth daily. 30 tablet 0   magnesium gluconate (MAGONATE) 500 MG tablet Take 1 tablet (500 mg total) by mouth at bedtime. 30 tablet 0   metoprolol succinate (TOPROL-XL) 25 MG 24 hr tablet Take 0.5 tablets (12.5 mg total) by mouth every evening. 30 tablet 0   montelukast (SINGULAIR) 10 MG tablet Take 1 tablet (10 mg total) by mouth at bedtime. 30 tablet 0   Multiple Vitamins-Iron  (MULTIVITAMINS WITH IRON) TABS tablet Take 1 tablet by mouth daily.  0   pantoprazole (PROTONIX) 40 MG tablet Take 1 tablet (40 mg total) by mouth daily. 30 tablet 0   predniSONE (DELTASONE) 5 MG tablet Take 1 tablet (5 mg total) by mouth 2 (two) times daily with a meal. 60 tablet 0   spironolactone (ALDACTONE) 25 MG tablet Take 0.5 tablets (12.5 mg total) by mouth daily. 30 tablet 0   Vitamin D, Ergocalciferol, (DRISDOL) 1.25 MG (50000 UNIT) CAPS capsule Take 1 capsule (50,000 Units total) by mouth every 7 (seven) days. 5 capsule 0   [DISCONTINUED] rivaroxaban (XARELTO) 10 MG TABS tablet Take 10 mg by mouth daily with breakfast.     No current facility-administered medications on file prior to visit.    Allergies  Allergen Reactions   Infliximab Nausea Only and Shortness Of Breath    Objective: There were no vitals filed for this visit.  General: No acute distress, AAOx3  Left foot: Staples intact to wound bed left  dorsal forefoot wound measures 6 x 3 with a deep crater noted to the dorsal wound over the fourth metatarsal with fatty tissue exposed clear to yellow active drainage, minimal localized edema, no erythema, no warmth, capillary fill time delayed to palpation to left foot.   Post Op Xray, left foot consistent with metatarsal head resection on 4 with calcific arteries and significant digital deformity secondary to arthritis.   Assessment and Plan:  Problem List Items Addressed This Visit       Cardiovascular and Mediastinum   Venous insufficiency   Relevant Orders   Ambulatory referral to Vascular Surgery     Musculoskeletal and Integument   Rheumatoid arthritis ([place])   Other Visit Diagnoses     Skin ulcer of left foot with fat layer exposed ([place])    -  Primary   Relevant Orders   Ambulatory referral to Vascular Surgery   DG Foot Complete Left   PVD (peripheral vascular disease) ([place])       Relevant Orders   Ambulatory referral to Vascular Surgery         -Patient seen and evaluated -X-rays reviewed -Applied PRISMA dry sterile dressing to surgical site left foot secured with ACE wrap and stockinet  -Advised patient to make sure to keep dressings clean, dry, and intact allowing home nurse to change as above -Advised patient to continue with post-op shoe on left -Advised patient to limit activity to necessity  -Advised patient to do with oral antibiotics until completed -Referral made to vascular for follow-up since his left foot wound appears to be stalling and not making much progress with healing -Will plan for wound check at next office visit. In the meantime, patient to call office if any issues or problems arise.   Landis Martins, DPM

## 2020-11-19 NOTE — Telephone Encounter (Signed)
Spoke to pt's caretaker Vernell and advised of Dr. Claiborne Billings (DOD) recommendations. Vernell verbalized understanding and med list updated.   Follow up appointment scheduled for 12/04/20 with Dr. Percival Spanish.

## 2020-11-19 NOTE — Progress Notes (Signed)
Subjective: Jonathan [name] is a 60 y.o. male patient seen in office for evaluation of ulceration of the ---------. Patient has a history of diabetes and a blood glucose level  today of --- mg/dl.   Patient is changing the dressing using ---------- at home/ with help of nursing. Denies nausea/fever/vomiting/chills/night sweats/shortness of breath/pain. Patient has no other pedal complaints at this time.  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   Physical debility 11/21/2019   Syncope 11/21/2019   DDD (degenerative disc disease), lumbar 09/27/2019   Open wound of left foot 09/27/2019   Open wound of right foot 09/27/2019   Pain due to onychomycosis of toenails of both feet 08/28/2019   Acute on chronic HFrEF (heart failure with reduced ejection fraction) ([place]) 08/22/2019   Chronic pain syndrome 08/22/2019   Rheumatoid arthritis ([place]) 08/22/2019   Venous stasis ulcers ([place]) 08/22/2019   Anemia,  unspecified 08/22/2019   NSTEMI (non-ST elevated myocardial infarction) ([place]) 08/21/2019   Cervical spondylosis without myelopathy 08/21/2019   Lumbar facet arthropathy 08/14/2019   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   B12 deficiency 02/08/2019   Glucose intolerance (impaired glucose tolerance) 02/08/2019   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   Hyperglycemia 12/27/2018   Cellulitis 12/27/2018   Wound drainage 12/22/2018   Foot ulcer ([place]) 12/21/2018   Encounter for screening for HIV 12/21/2018   Arthritis 08/22/2018   HLD (hyperlipidemia) 08/22/2018   Lung nodule 08/22/2018   Renal cyst, right 08/22/2018   Rheumatoid arthritis involving multiple sites with positive rheumatoid factor ([place]) 08/22/2018   Venous insufficiency 08/22/2018   Vitamin D deficiency 08/22/2018   Seasonal allergic rhinitis 07/16/2018   Essential hypertension 02/23/2018   Insomnia 02/23/2018   Tinea versicolor 02/23/2018   Antalgic gait 09/21/2017   Chronic pain of both knees 09/21/2017   Foot pain, bilateral 09/21/2017   Elevated liver enzymes 07/10/2017   Leg edema 06/08/2017   Primary osteoarthritis, left ankle and foot 05/09/2017   Post-traumatic osteoarthritis, right ankle and foot 05/09/2017   Primary osteoarthritis, right ankle and foot 05/09/2017   Erectile dysfunction 02/15/2017   Excessive sweating 02/15/2017   Difficulty transferring 01/27/2017   Acquired bilateral flat feet 11/15/2016   Other acquired hammer toe 11/15/2016   Erosive gastritis 09/05/2016   At high risk for falls 08/15/2016   Chronic bilateral low back pain without sciatica 10/13/2015   Drug therapy 08/03/2015   Rheumatoid arthritis involving left hip ([place]) 01/29/2015   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 30 tablet 0   aspirin 81 MG chewable tablet Continue for 5 days and stop     atorvastatin (LIPITOR) 80 MG tablet Take 1 tablet (80 mg total) by mouth daily. 30 tablet 0   cetirizine (ZYRTEC) 10 MG tablet Take 10 mg by mouth daily as needed for allergies.     clopidogrel (PLAVIX) 75 MG tablet Take 1 tablet (75 mg total) by mouth daily. 30 tablet 0   diclofenac Sodium (VOLTAREN) 1 % GEL Apply 2 g topically 4 (four) times daily. 2 g 0   docusate sodium (COLACE) 100 MG capsule Take 1 capsule (100 mg total) by mouth 2 (two) times daily. 30 capsule 0   fluticasone (FLONASE) 50 MCG/ACT nasal spray Place 1 spray into the nose daily as needed for allergies.     furosemide (LASIX) 20 MG tablet Take 1 tablet (20 mg total) by mouth daily as needed. 5 tablet 0   gabapentin (NEURONTIN) 100 MG capsule Take 2 capsules (200 mg total) by mouth 3 (three) times daily. 90 capsule 0   isosorbide mononitrate (IMDUR) 60 MG 24 hr tablet Take 1 tablet (60 mg total) by mouth every evening. 30 tablet 0   leflunomide (ARAVA) 10 MG tablet Take 1 tablet (10 mg total) by mouth daily. 30 tablet 0   magnesium gluconate (MAGONATE) 500 MG tablet Take 1 tablet (500 mg total) by mouth at bedtime. 30 tablet 0   metoprolol succinate (TOPROL-XL) 25 MG 24 hr tablet Take 0.5 tablets (12.5 mg total) by mouth every evening. 30 tablet 0   montelukast (SINGULAIR) 10 MG tablet Take 1 tablet (10 mg total) by mouth at bedtime. 30 tablet 0   Multiple Vitamins-Iron (MULTIVITAMINS WITH IRON) TABS tablet Take 1 tablet by mouth  daily.  0   pantoprazole (PROTONIX) 40 MG tablet Take 1 tablet (40 mg total) by mouth daily. 30 tablet 0   predniSONE (DELTASONE) 5 MG tablet Take 1 tablet (5 mg total) by mouth 2 (two) times daily with a meal. 60 tablet 0   spironolactone (ALDACTONE) 25 MG tablet Take 0.5 tablets (12.5 mg total) by mouth daily. 30 tablet 0   Vitamin D, Ergocalciferol, (DRISDOL) 1.25 MG (50000 UNIT) CAPS capsule Take 1 capsule (50,000 Units total) by mouth every 7 (seven) days. 5 capsule 0   [DISCONTINUED] rivaroxaban (XARELTO) 10 MG TABS tablet Take 10 mg by mouth daily with breakfast.     No current facility-administered medications on file prior to visit.   Allergies  Allergen Reactions   Infliximab Nausea Only and Shortness Of Breath    Recent Results (from the past 2160 hour(s))  CBC     Status: Abnormal   Collection Time: 09/07/20  6:44 AM  Result Value Ref Range   WBC 6.6 4.0 - 10.5 K/uL   RBC 4.07 (L) 4.22 - 5.81 MIL/uL  Hemoglobin 9.9 (L) 13.0 - 17.0 g/dL   HCT 32.2 (L) 39.0 - 52.0 %   MCV 79.1 (L) 80.0 - 100.0 fL   MCH 24.3 (L) 26.0 - 34.0 pg   MCHC 30.7 30.0 - 36.0 g/dL   RDW 14.9 11.5 - 15.5 %   Platelets 342 150 - 400 K/uL   nRBC 0.0 0.0 - 0.2 %    Comment: Performed at [facility], [place] [address] [place], [place] [address]  Troponin I (High Sensitivity)     Status: Abnormal   Collection Time: 09/07/20  6:44 AM  Result Value Ref Range   Troponin I (High Sensitivity) 20 (H) <18 ng/L    Comment: (NOTE) Elevated high sensitivity troponin I (hsTnI) values and significant  changes across serial measurements may suggest ACS but many other  chronic and acute conditions are known to elevate hsTnI results.  Refer to the "Links" section for chest pain algorithms and additional  guidance. Performed at [facility], [place] [address] [place], [place] [id number]   Basic metabolic panel     Status: Abnormal   Collection Time: 09/07/20  7:47 AM  Result  Value Ref Range   Sodium 138 135 - 145 mmol/L   Potassium 3.3 (L) 3.5 - 5.1 mmol/L   Chloride 104 98 - 111 mmol/L   CO2 28 22 - 32 mmol/L   Glucose, Bld 125 (H) 70 - 99 mg/dL    Comment: Glucose reference range applies only to samples taken after fasting for at least 8 hours.   BUN 10 6 - 20 mg/dL   Creatinine, Ser 0.41 (L) 0.61 - 1.24 mg/dL   Calcium 9.6 8.9 - 10.3 mg/dL   GFR, Estimated >60 >60 mL/min    Comment: (NOTE) Calculated using the CKD-EPI Creatinine Equation (2021)    Anion gap 6 5 - 15    Comment: Performed at [facility], [place] [address] [place], [place] [address]  Troponin I (High Sensitivity)     Status: Abnormal   Collection Time: 09/07/20  8:44 AM  Result Value Ref Range   Troponin I (High Sensitivity) 23 (H) <18 ng/L    Comment: (NOTE) Elevated high sensitivity troponin I (hsTnI) values and significant  changes across serial measurements may suggest ACS but many other  chronic and acute conditions are known to elevate hsTnI results.  Refer to the "Links" section for chest pain algorithms and additional  guidance. Performed at [facility], [place] [address], [place], [place] [address]   D-dimer, quantitative     Status: Abnormal   Collection Time: 09/07/20 11:45 AM  Result Value Ref Range   D-Dimer, Quant 15.28 (H) 0.00 - 0.50 ug/mL-FEU    Comment: (NOTE) At the manufacturer cut-off value of 0.5 g/mL FEU, this assay has a negative predictive value of 95-100%.This assay is intended for use in conjunction with a clinical pretest probability (PTP) assessment model to exclude pulmonary embolism (PE) and deep venous thrombosis (DVT) in outpatients suspected of PE or DVT. Results should be correlated with clinical presentation. Performed at [facility], [place] [address] [place], [place] [address]   CBC with Differential/Platelet     Status: Abnormal   Collection Time: 09/26/20  1:33 AM  Result Value Ref  Range   WBC 8.3 4.0 - 10.5 K/uL   RBC 4.02 (L) 4.22 - 5.81 MIL/uL   Hemoglobin 9.8 (L) 13.0 - 17.0 g/dL   HCT 31.5 (L) 39.0 -  52.0 %   MCV 78.4 (L) 80.0 - 100.0 fL   MCH 24.4 (L) 26.0 - 34.0 pg   MCHC 31.1 30.0 - 36.0 g/dL   RDW 15.4 11.5 - 15.5 %   Platelets 321 150 - 400 K/uL   nRBC 0.0 0.0 - 0.2 %   Neutrophils Relative % 82 %   Neutro Abs 6.8 1.7 - 7.7 K/uL   Lymphocytes Relative 5 %   Lymphs Abs 0.5 (L) 0.7 - 4.0 K/uL   Monocytes Relative 6 %   Monocytes Absolute 0.5 0.1 - 1.0 K/uL   Eosinophils Relative 6 %   Eosinophils Absolute 0.5 0.0 - 0.5 K/uL   Basophils Relative 0 %   Basophils Absolute 0.0 0.0 - 0.1 K/uL   Immature Granulocytes 1 %   Abs Immature Granulocytes 0.05 0.00 - 0.07 K/uL    Comment: Performed at [facility], [place] [address] [place], [place] [address]  Comprehensive metabolic panel     Status: Abnormal   Collection Time: 09/26/20  1:33 AM  Result Value Ref Range   Sodium 137 135 - 145 mmol/L   Potassium 4.1 3.5 - 5.1 mmol/L    Comment: SLIGHT HEMOLYSIS   Chloride 105 98 - 111 mmol/L   CO2 23 22 - 32 mmol/L   Glucose, Bld 124 (H) 70 - 99 mg/dL    Comment: Glucose reference range applies only to samples taken after fasting for at least 8 hours.   BUN 8 6 - 20 mg/dL   Creatinine, Ser 0.68 0.61 - 1.24 mg/dL   Calcium 8.8 (L) 8.9 - 10.3 mg/dL   Total Protein 6.9 6.5 - 8.1 g/dL   Albumin 2.9 (L) 3.5 - 5.0 g/dL   AST 34 15 - 41 U/L   ALT 12 0 - 44 U/L   Alkaline Phosphatase 75 38 - 126 U/L   Total Bilirubin 1.3 (H) 0.3 - 1.2 mg/dL   GFR, Estimated >60 >60 mL/min    Comment: (NOTE) Calculated using the CKD-EPI Creatinine Equation (2021)    Anion gap 9 5 - 15    Comment: Performed at [facility], [place] [address] [place], [place] [address]  Troponin I (High Sensitivity)     Status: Abnormal   Collection Time: 09/26/20  1:33 AM  Result Value Ref Range   Troponin I (High Sensitivity) 107 (HH) <18 ng/L    Comment:  CRITICAL RESULT CALLED TO, READ BACK BY AND VERIFIED WITH: JAKE TALKINGTON AT 0315 ON 09/26/20 BY MAJ (NOTE) Elevated high sensitivity troponin I (hsTnI) values and significant  changes across serial measurements may suggest ACS but many other  chronic and acute conditions are known to elevate hsTnI results.  Refer to the Links section for chest pain algorithms and additional  guidance. Performed at [facility], [place] [address] [place], [place] [address]   Brain natriuretic peptide     Status: Abnormal   Collection Time: 09/26/20  1:33 AM  Result Value Ref Range   B Natriuretic Peptide 488.9 (H) 0.0 - 100.0 pg/mL    Comment: Performed at [facility], [place] [address] [place], [place] [address]  Resp Panel by RT-PCR (Flu A&B, Covid) Nasopharyngeal Swab     Status: None   Collection Time: 09/26/20  1:34 AM   Specimen: Nasopharyngeal Swab; Nasopharyngeal(NP) swabs in vial transport medium  Result Value Ref Range   SARS Coronavirus 2 by RT PCR NEGATIVE NEGATIVE    Comment: (NOTE) SARS-CoV-2  target nucleic acids are NOT DETECTED.  The SARS-CoV-2 RNA is generally detectable in upper respiratory specimens during the acute phase of infection. The lowest concentration of SARS-CoV-2 viral copies this assay can detect is 138 copies/mL. A negative result does not preclude SARS-Cov-2 infection and should not be used as the sole basis for treatment or other patient management decisions. A negative result may occur with  improper specimen collection/handling, submission of specimen other than nasopharyngeal swab, presence of viral mutation(s) within the areas targeted by this assay, and inadequate number of viral copies(<138 copies/mL). A negative result must be combined with clinical observations, patient history, and epidemiological information. The expected result is Negative.  Fact Sheet for Patients:  EntrepreneurPulse.com.au  Fact  Sheet for Healthcare Providers:  IncredibleEmployment.be  This test is no t yet approved or cleared by the [place] FDA and  has been authorized for detection and/or diagnosis of SARS-CoV-2 by FDA under an Emergency Use Authorization (EUA). This EUA will remain  in effect (meaning this test can be used) for the duration of the COVID-19 declaration under Section 564(b)(1) of the Act, 21 U.S.C.section 360bbb-3(b)(1), unless the authorization is terminated  or revoked sooner.       Influenza A by PCR NEGATIVE NEGATIVE   Influenza B by PCR NEGATIVE NEGATIVE    Comment: (NOTE) The Xpert Xpress SARS-CoV-2/FLU/RSV plus assay is intended as an aid in the diagnosis of influenza from Nasopharyngeal swab specimens and should not be used as a sole basis for treatment. Nasal washings and aspirates are unacceptable for Xpert Xpress SARS-CoV-2/FLU/RSV testing.  Fact Sheet for Patients: EntrepreneurPulse.com.au  Fact Sheet for Healthcare Providers: IncredibleEmployment.be  This test is not yet approved or cleared by the [place] FDA and has been authorized for detection and/or diagnosis of SARS-CoV-2 by FDA under an Emergency Use Authorization (EUA). This EUA will remain in effect (meaning this test can be used) for the duration of the COVID-19 declaration under Section 564(b)(1) of the Act, 21 U.S.C. section 360bbb-3(b)(1), unless the authorization is terminated or revoked.  Performed at [facility], [place] [address], [place], [place] [address]   I-stat chem 8, ED (not at [facility] or [facility])     Status: Abnormal   Collection Time: 09/26/20  2:16 AM  Result Value Ref Range   Sodium 139 135 - 145 mmol/L   Potassium 3.2 (L) 3.5 - 5.1 mmol/L   Chloride 104 98 - 111 mmol/L   BUN 7 6 - 20 mg/dL   Creatinine, Ser 0.50 (L) 0.61 - 1.24 mg/dL   Glucose, Bld 131 (H) 70 - 99 mg/dL    Comment: Glucose reference range  applies only to samples taken after fasting for at least 8 hours.   Calcium, Ion 1.16 1.15 - 1.40 mmol/L   TCO2 27 22 - 32 mmol/L   Hemoglobin 9.9 (L) 13.0 - 17.0 g/dL   HCT 29.0 (L) 39.0 - 52.0 %  Troponin I (High Sensitivity)     Status: Abnormal   Collection Time: 09/26/20  4:05 AM  Result Value Ref Range   Troponin I (High Sensitivity) 144 (HH) <18 ng/L    Comment: CRITICAL RESULT CALLED TO, READ BACK BY AND VERIFIED WITH: Burnis Medin AT [id number] ON 09/26/20 BY MAJ (NOTE) Elevated high sensitivity troponin I (hsTnI) values and significant  changes across serial measurements may suggest ACS but many other  chronic and acute conditions are known to elevate hsTnI results.  Refer to the Links section for chest pain  algorithms and additional  guidance. Performed at [facility], [place] [address], [place], [place] [address]   Magnesium     Status: None   Collection Time: 09/26/20  4:05 AM  Result Value Ref Range   Magnesium 1.7 1.7 - 2.4 mg/dL    Comment: Performed at [facility], [place] [address], [place], [place] [address]  HIV Antibody (routine testing w rflx)     Status: None   Collection Time: 09/26/20  8:50 AM  Result Value Ref Range   HIV Screen 4th Generation wRfx Non Reactive Non Reactive    Comment: Performed at [facility], [place] [address], [place], [place] [address]  Heparin level (unfractionated)     Status: Abnormal   Collection Time: 09/26/20 10:30 AM  Result Value Ref Range   Heparin Unfractionated <0.10 (L) 0.30 - 0.70 IU/mL    Comment: (NOTE) The clinical reportable range upper limit is being lowered to >1.10 to align with the FDA approved guidance for the current laboratory assay.  If heparin results are below expected values, and patient dosage has  been confirmed, suggest follow up testing of antithrombin III levels. Performed at [facility], [place] [address] [place], [place] [address]    ECHOCARDIOGRAM COMPLETE     Status: None   Collection Time: 09/26/20 12:40 PM  Result Value Ref Range   Weight 2,240.01 oz   Height 63 in   BP 99/60 mmHg   Single Plane A2C EF 25.4 %   Single Plane A4C EF 41.2 %   Calc EF 32.9 %   S' Lateral 4.80 cm   Area-P 1/2 6.12 cm2  Troponin I (High Sensitivity)     Status: Abnormal   Collection Time: 09/26/20  1:15 PM  Result Value Ref Range   Troponin I (High Sensitivity) 84 (H) <18 ng/L    Comment: DELTA CHECK NOTED (NOTE) Elevated high sensitivity troponin I (hsTnI) values and significant  changes across serial measurements may suggest ACS but many other  chronic and acute conditions are known to elevate hsTnI results.  Refer to the Links section for chest pain algorithms and additional  guidance. Performed at [facility], [place] [address] [place], [place] [address]   Heparin level (unfractionated)     Status: Abnormal   Collection Time: 09/26/20  6:27 PM  Result Value Ref Range   Heparin Unfractionated 0.10 (L) 0.30 - 0.70 IU/mL    Comment: (NOTE) The clinical reportable range upper limit is being lowered to >1.10 to align with the FDA approved guidance for the current laboratory assay.  If heparin results are below expected values, and patient dosage has  been confirmed, suggest follow up testing of antithrombin III levels. Performed at [facility], [place] [address] [place], [place] [address]   CBC     Status: Abnormal   Collection Time: 09/27/20  1:38 AM  Result Value Ref Range   WBC 10.5 4.0 - 10.5 K/uL   RBC 4.13 (L) 4.22 - 5.81 MIL/uL   Hemoglobin 10.0 (L) 13.0 - 17.0 g/dL   HCT 32.2 (L) 39.0 - 52.0 %   MCV 78.0 (L) 80.0 - 100.0 fL   MCH 24.2 (L) 26.0 - 34.0 pg   MCHC 31.1 30.0 - 36.0 g/dL   RDW 15.5 11.5 - 15.5 %   Platelets 346 150 - 400 K/uL   nRBC 0.0 0.0 - 0.2 %    Comment: Performed at [facility], [place] [address], [place], [place] [id number]  Basic  metabolic panel     Status: Abnormal   Collection Time: 09/27/20  1:38 AM  Result Value Ref Range   Sodium 137 135 - 145 mmol/L   Potassium 3.5 3.5 - 5.1 mmol/L   Chloride 103 98 - 111 mmol/L   CO2 28 22 - 32 mmol/L   Glucose, Bld 157 (H) 70 - 99 mg/dL    Comment: Glucose reference range applies only to samples taken after fasting for at least 8 hours.   BUN 16 6 - 20 mg/dL   Creatinine, Ser 0.60 (L) 0.61 - 1.24 mg/dL   Calcium 9.4 8.9 - 10.3 mg/dL   GFR, Estimated >60 >60 mL/min    Comment: (NOTE) Calculated using the CKD-EPI Creatinine Equation (2021)    Anion gap 6 5 - 15    Comment: Performed at [facility], [place] [address], [place], [place] [address]  Lipid panel     Status: Abnormal   Collection Time: 09/27/20  1:38 AM  Result Value Ref Range   Cholesterol 202 (H) 0 - 200 mg/dL   Triglycerides 88 <150 mg/dL   HDL 56 >40 mg/dL   Total CHOL/HDL Ratio 3.6 RATIO   VLDL 18 0 - 40 mg/dL   LDL Cholesterol 128 (H) 0 - 99 mg/dL    Comment:        Total Cholesterol/HDL:CHD Risk Coronary Heart Disease Risk Table                     Men   Women  1/2 Average Risk   3.4   3.3  Average Risk       5.0   4.4  2 X Average Risk   9.6   7.1  3 X Average Risk  23.4   11.0        Use the calculated Patient Ratio above and the CHD Risk Table to determine the patient's CHD Risk.        ATP III CLASSIFICATION (LDL):  <100     mg/dL   Optimal  100-129  mg/dL   Near or Above                    Optimal  130-159  mg/dL   Borderline  160-189  mg/dL   High  >190     mg/dL   Very High Performed at [place] [address] [place], [place] [address]   Heparin level (unfractionated)     Status: Abnormal   Collection Time: 09/27/20  1:38 AM  Result Value Ref Range   Heparin Unfractionated 0.16 (L) 0.30 - 0.70 IU/mL    Comment: (NOTE) The clinical reportable range upper limit is being lowered to >1.10 to align with the FDA approved guidance for the  current laboratory assay.  If heparin results are below expected values, and patient dosage has  been confirmed, suggest follow up testing of antithrombin III levels. Performed at [facility], [place] [address], [place], [place] [address]   Heparin level (unfractionated)     Status: None   Collection Time: 09/27/20  8:53 AM  Result Value Ref Range   Heparin Unfractionated 0.41 0.30 - 0.70 IU/mL    Comment: (NOTE) The clinical reportable range upper limit is being lowered to >1.10 to align with the FDA approved guidance for the current laboratory assay.  If heparin results are below expected values,  and patient dosage has  been confirmed, suggest follow up testing of antithrombin III levels. Performed at [facility], [place] [address] [place], [place] [address]   Procalcitonin - Baseline     Status: None   Collection Time: 09/27/20  8:53 AM  Result Value Ref Range   Procalcitonin <0.10 ng/mL    Comment:        Interpretation: PCT (Procalcitonin) <= 0.5 ng/mL: Systemic infection (sepsis) is not likely. Local bacterial infection is possible. (NOTE)       Sepsis PCT Algorithm           Lower Respiratory Tract                                      Infection PCT Algorithm    ----------------------------     ----------------------------         PCT < 0.25 ng/mL                PCT < 0.10 ng/mL          Strongly encourage             Strongly discourage   discontinuation of antibiotics    initiation of antibiotics    ----------------------------     -----------------------------       PCT 0.25 - 0.50 ng/mL            PCT 0.10 - 0.25 ng/mL               OR       >80% decrease in PCT            Discourage initiation of                                            antibiotics      Encourage discontinuation           of antibiotics    ----------------------------     -----------------------------         PCT >= 0.50 ng/mL              PCT 0.26 - 0.50  ng/mL               AND        <80% decrease in PCT             Encourage initiation of                                             antibiotics       Encourage continuation           of antibiotics    ----------------------------     -----------------------------        PCT >= 0.50 ng/mL                  PCT > 0.50 ng/mL               AND         increase in PCT                  Strongly encourage  initiation of antibiotics    Strongly encourage escalation           of antibiotics                                     -----------------------------                                           PCT <= 0.25 ng/mL                                                 OR                                        > 80% decrease in PCT                                      Discontinue / Do not initiate                                             antibiotics  Performed at [facility] [address], [place], [place] [address]   Magnesium     Status: None   Collection Time: 09/27/20  8:53 AM  Result Value Ref Range   Magnesium 2.0 1.7 - 2.4 mg/dL    Comment: Performed at [facility], [place] [address], [place], [place] [address]  Heparin level (unfractionated)     Status: None   Collection Time: 09/27/20  3:06 PM  Result Value Ref Range   Heparin Unfractionated 0.59 0.30 - 0.70 IU/mL    Comment: (NOTE) The clinical reportable range upper limit is being lowered to >1.10 to align with the FDA approved guidance for the current laboratory assay.  If heparin results are below expected values, and patient dosage has  been confirmed, suggest follow up testing of antithrombin III levels. Performed at [facility], [place] [address] [place], [place] [address]   Surgical pcr screen     Status: None   Collection Time: 09/28/20  3:40 AM   Specimen: Nasal Mucosa; Nasal Swab  Result Value Ref Range   MRSA, PCR NEGATIVE  NEGATIVE   Staphylococcus aureus NEGATIVE NEGATIVE    Comment: (NOTE) The Xpert SA Assay (FDA approved for NASAL specimens in patients 57 years of age and older), is one component of a comprehensive surveillance program. It is not intended to diagnose infection nor to guide or monitor treatment. Performed at [facility], [place] [address] [place], [place] [address]   CBC     Status: Abnormal   Collection Time: 09/28/20  5:19 AM  Result Value Ref Range   WBC 6.4 4.0 - 10.5 K/uL   RBC 3.92 (L) 4.22 - 5.81 MIL/uL   Hemoglobin 9.4 (L) 13.0 - 17.0 g/dL   HCT 31.2 (L) 39.0 - 52.0 %  MCV 79.6 (L) 80.0 - 100.0 fL   MCH 24.0 (L) 26.0 - 34.0 pg   MCHC 30.1 30.0 - 36.0 g/dL   RDW 15.6 (H) 11.5 - 15.5 %   Platelets 320 150 - 400 K/uL   nRBC 0.0 0.0 - 0.2 %    Comment: Performed at [facility], [place] [address], [place], [place] [id number]  Basic metabolic panel     Status: Abnormal   Collection Time: 09/28/20  5:19 AM  Result Value Ref Range   Sodium 139 135 - 145 mmol/L   Potassium 4.1 3.5 - 5.1 mmol/L   Chloride 102 98 - 111 mmol/L   CO2 29 22 - 32 mmol/L   Glucose, Bld 98 70 - 99 mg/dL    Comment: Glucose reference range applies only to samples taken after fasting for at least 8 hours.   BUN 16 6 - 20 mg/dL   Creatinine, Ser 0.56 (L) 0.61 - 1.24 mg/dL   Calcium 9.7 8.9 - 10.3 mg/dL   GFR, Estimated >60 >60 mL/min    Comment: (NOTE) Calculated using the CKD-EPI Creatinine Equation (2021)    Anion gap 8 5 - 15    Comment: Performed at [facility], [place] [address] [place], [place] [address]  Heparin level (unfractionated)     Status: None   Collection Time: 09/28/20  5:19 AM  Result Value Ref Range   Heparin Unfractionated 0.31 0.30 - 0.70 IU/mL    Comment: (NOTE) The clinical reportable range upper limit is being lowered to >1.10 to align with the FDA approved guidance for the current laboratory assay.  If heparin results  are below expected values, and patient dosage has  been confirmed, suggest follow up testing of antithrombin III levels. Performed at [facility], [place] [address] [place], [place] [address]   POCT I-Stat EG7     Status: Abnormal   Collection Time: 09/28/20 10:51 AM  Result Value Ref Range   pH, Ven 7.412 7.250 - 7.430   pCO2, Ven 47.5 44.0 - 60.0 mmHg   pO2, Ven 36.0 32.0 - 45.0 mmHg   Bicarbonate 30.3 (H) 20.0 - 28.0 mmol/L   TCO2 32 22 - 32 mmol/L   O2 Saturation 68.0 %   Acid-Base Excess 5.0 (H) 0.0 - 2.0 mmol/L   Sodium 139 135 - 145 mmol/L   Potassium 3.7 3.5 - 5.1 mmol/L   Calcium, Ion 1.21 1.15 - 1.40 mmol/L   HCT 30.0 (L) 39.0 - 52.0 %   Hemoglobin 10.2 (L) 13.0 - 17.0 g/dL   Sample type MIXED VENOUS SAMPLE    Comment NOTIFIED PHYSICIAN   I-STAT 7, (LYTES, BLD GAS, ICA, H+H)     Status: Abnormal   Collection Time: 09/28/20 10:51 AM  Result Value Ref Range   pH, Arterial 7.443 7.350 - 7.450   pCO2 arterial 43.8 32.0 - 48.0 mmHg   pO2, Arterial 204 (H) 83.0 - 108.0 mmHg   Bicarbonate 29.9 (H) 20.0 - 28.0 mmol/L   TCO2 31 22 - 32 mmol/L   O2 Saturation 100.0 %   Acid-Base Excess 5.0 (H) 0.0 - 2.0 mmol/L   Sodium 139 135 - 145 mmol/L   Potassium 3.7 3.5 - 5.1 mmol/L   Calcium, Ion 1.21 1.15 - 1.40 mmol/L   HCT 29.0 (L) 39.0 - 52.0 %   Hemoglobin 9.9 (L) 13.0 - 17.0 g/dL   Sample type ARTERIAL   CBC     Status: Abnormal  Collection Time: 09/29/20  4:47 AM  Result Value Ref Range   WBC 6.1 4.0 - 10.5 K/uL   RBC 4.17 (L) 4.22 - 5.81 MIL/uL   Hemoglobin 10.2 (L) 13.0 - 17.0 g/dL   HCT 33.3 (L) 39.0 - 52.0 %   MCV 79.9 (L) 80.0 - 100.0 fL   MCH 24.5 (L) 26.0 - 34.0 pg   MCHC 30.6 30.0 - 36.0 g/dL   RDW 15.6 (H) 11.5 - 15.5 %   Platelets 307 150 - 400 K/uL   nRBC 0.0 0.0 - 0.2 %    Comment: Performed at [facility], [place] [address], [place], [place] [id number]  Basic metabolic panel     Status: Abnormal   Collection Time:  09/29/20  4:47 AM  Result Value Ref Range   Sodium 137 135 - 145 mmol/L   Potassium 3.7 3.5 - 5.1 mmol/L   Chloride 102 98 - 111 mmol/L   CO2 27 22 - 32 mmol/L   Glucose, Bld 117 (H) 70 - 99 mg/dL    Comment: Glucose reference range applies only to samples taken after fasting for at least 8 hours.   BUN 15 6 - 20 mg/dL   Creatinine, Ser 0.65 0.61 - 1.24 mg/dL   Calcium 9.5 8.9 - 10.3 mg/dL   GFR, Estimated >60 >60 mL/min    Comment: (NOTE) Calculated using the CKD-EPI Creatinine Equation (2021)    Anion gap 8 5 - 15    Comment: Performed at [facility], [place] [address] [place], [place] [address]  CBC with Differential     Status: Abnormal   Collection Time: 10/09/20  6:59 PM  Result Value Ref Range   WBC 6.0 4.0 - 10.5 K/uL   RBC 3.57 (L) 4.22 - 5.81 MIL/uL   Hemoglobin 8.8 (L) 13.0 - 17.0 g/dL   HCT 28.9 (L) 39.0 - 52.0 %   MCV 81.0 80.0 - 100.0 fL   MCH 24.6 (L) 26.0 - 34.0 pg   MCHC 30.4 30.0 - 36.0 g/dL   RDW 15.8 (H) 11.5 - 15.5 %   Platelets 293 150 - 400 K/uL   nRBC 0.0 0.0 - 0.2 %   Neutrophils Relative % 60 %   Neutro Abs 3.6 1.7 - 7.7 K/uL   Lymphocytes Relative 17 %   Lymphs Abs 1.1 0.7 - 4.0 K/uL   Monocytes Relative 10 %   Monocytes Absolute 0.6 0.1 - 1.0 K/uL   Eosinophils Relative 12 %   Eosinophils Absolute 0.7 (H) 0.0 - 0.5 K/uL   Basophils Relative 1 %   Basophils Absolute 0.1 0.0 - 0.1 K/uL   Immature Granulocytes 0 %   Abs Immature Granulocytes 0.02 0.00 - 0.07 K/uL    Comment: Performed at [facility], [place] [address], [place], [place] [id number]  Basic metabolic panel     Status: None   Collection Time: 10/09/20  6:59 PM  Result Value Ref Range   Sodium 141 135 - 145 mmol/L   Potassium 3.7 3.5 - 5.1 mmol/L   Chloride 109 98 - 111 mmol/L   CO2 25 22 - 32 mmol/L   Glucose, Bld 91 70 - 99 mg/dL    Comment: Glucose reference range applies only to samples taken after fasting for at least 8 hours.   BUN 12 6 -  20 mg/dL   Creatinine, Ser 0.67 0.61 - 1.24 mg/dL   Calcium 9.8 8.9 - 10.3 mg/dL   GFR, Estimated >  60 >60 mL/min    Comment: (NOTE) Calculated using the CKD-EPI Creatinine Equation (2021)    Anion gap 7 5 - 15    Comment: Performed at [facility], [place] [address] [place], [place] [address]  SARS CORONAVIRUS 2 (TAT 6-24 HRS) Nasopharyngeal Nasopharyngeal Swab     Status: None   Collection Time: 10/09/20  9:57 PM   Specimen: Nasopharyngeal Swab  Result Value Ref Range   SARS Coronavirus 2 NEGATIVE NEGATIVE    Comment: (NOTE) SARS-CoV-2 target nucleic acids are NOT DETECTED.  The SARS-CoV-2 RNA is generally detectable in upper and lower respiratory specimens during the acute phase of infection. Negative results do not preclude SARS-CoV-2 infection, do not rule out co-infections with other pathogens, and should not be used as the sole basis for treatment or other patient management decisions. Negative results must be combined with clinical observations, patient history, and epidemiological information. The expected result is Negative.  Fact Sheet for Patients: SugarRoll.be  Fact Sheet for Healthcare Providers: https://www.woods-mathews.com/  This test is not yet approved or cleared by the [place] FDA and  has been authorized for detection and/or diagnosis of SARS-CoV-2 by FDA under an Emergency Use Authorization (EUA). This EUA will remain  in effect (meaning this test can be used) for the duration of the COVID-19 declaration under Se ction 564(b)(1) of the Act, 21 U.S.C. section 360bbb-3(b)(1), unless the authorization is terminated or revoked sooner.  Performed at [facility], [place] [address] [place], [place] [address]   Comprehensive metabolic panel     Status: Abnormal   Collection Time: 10/10/20  3:51 AM  Result Value Ref Range   Sodium 141 135 - 145 mmol/L   Potassium 3.6 3.5 - 5.1 mmol/L    Chloride 110 98 - 111 mmol/L   CO2 27 22 - 32 mmol/L   Glucose, Bld 106 (H) 70 - 99 mg/dL    Comment: Glucose reference range applies only to samples taken after fasting for at least 8 hours.   BUN 10 6 - 20 mg/dL   Creatinine, Ser 0.57 (L) 0.61 - 1.24 mg/dL   Calcium 9.5 8.9 - 10.3 mg/dL   Total Protein 6.5 6.5 - 8.1 g/dL   Albumin 2.9 (L) 3.5 - 5.0 g/dL   AST 19 15 - 41 U/L   ALT 11 0 - 44 U/L   Alkaline Phosphatase 80 38 - 126 U/L   Total Bilirubin 0.4 0.3 - 1.2 mg/dL   GFR, Estimated >60 >60 mL/min    Comment: (NOTE) Calculated using the CKD-EPI Creatinine Equation (2021)    Anion gap 4 (L) 5 - 15    Comment: Performed at [facility], [place] [address], [place], [place] [address]  CBC     Status: Abnormal   Collection Time: 10/10/20  3:51 AM  Result Value Ref Range   WBC 4.7 4.0 - 10.5 K/uL   RBC 3.68 (L) 4.22 - 5.81 MIL/uL   Hemoglobin 9.1 (L) 13.0 - 17.0 g/dL   HCT 29.7 (L) 39.0 - 52.0 %   MCV 80.7 80.0 - 100.0 fL   MCH 24.7 (L) 26.0 - 34.0 pg   MCHC 30.6 30.0 - 36.0 g/dL   RDW 15.8 (H) 11.5 - 15.5 %   Platelets 250 150 - 400 K/uL   nRBC 0.0 0.0 - 0.2 %    Comment: Performed at [facility], [place] [address] [place], [place] [address]  Surgical pcr screen  Status: None   Collection Time: 10/10/20  3:34 PM   Specimen: Nasal Mucosa; Nasal Swab  Result Value Ref Range   MRSA, PCR NEGATIVE NEGATIVE   Staphylococcus aureus NEGATIVE NEGATIVE    Comment: (NOTE) The Xpert SA Assay (FDA approved for NASAL specimens in patients 27 years of age and older), is one component of a comprehensive surveillance program. It is not intended to diagnose infection nor to guide or monitor treatment. Performed at [facility], [place] [address], [place], [place] [address]   Creatinine, serum     Status: Abnormal   Collection Time: 10/11/20  4:09 AM  Result Value Ref Range   Creatinine, Ser 0.55 (L) 0.61 - 1.24 mg/dL   GFR,  Estimated >60 >60 mL/min    Comment: (NOTE) Calculated using the CKD-EPI Creatinine Equation (2021) Performed at [facility], [place] [address] [place], [place] [address]   Renal function panel     Status: Abnormal   Collection Time: 10/11/20  4:09 AM  Result Value Ref Range   Sodium 133 (L) 135 - 145 mmol/L    Comment: DELTA CHECK NOTED   Potassium 3.7 3.5 - 5.1 mmol/L   Chloride 102 98 - 111 mmol/L   CO2 25 22 - 32 mmol/L   Glucose, Bld 110 (H) 70 - 99 mg/dL    Comment: Glucose reference range applies only to samples taken after fasting for at least 8 hours.   BUN 8 6 - 20 mg/dL   Creatinine, Ser 0.68 0.61 - 1.24 mg/dL   Calcium 9.2 8.9 - 10.3 mg/dL   Phosphorus 2.4 (L) 2.5 - 4.6 mg/dL   Albumin 2.9 (L) 3.5 - 5.0 g/dL   GFR, Estimated >60 >60 mL/min    Comment: (NOTE) Calculated using the CKD-EPI Creatinine Equation (2021)    Anion gap 6 5 - 15    Comment: Performed at [facility], [place] [address] [place], [place] [address]  CBC     Status: Abnormal   Collection Time: 10/11/20  4:09 AM  Result Value Ref Range   WBC 9.2 4.0 - 10.5 K/uL   RBC 3.55 (L) 4.22 - 5.81 MIL/uL   Hemoglobin 8.8 (L) 13.0 - 17.0 g/dL   HCT 28.0 (L) 39.0 - 52.0 %   MCV 78.9 (L) 80.0 - 100.0 fL   MCH 24.8 (L) 26.0 - 34.0 pg   MCHC 31.4 30.0 - 36.0 g/dL   RDW 15.8 (H) 11.5 - 15.5 %   Platelets 272 150 - 400 K/uL   nRBC 0.0 0.0 - 0.2 %    Comment: Performed at [facility], [place] [address], [place], [place] [address]  Magnesium     Status: None   Collection Time: 10/11/20  4:09 AM  Result Value Ref Range   Magnesium 1.7 1.7 - 2.4 mg/dL    Comment: Performed at [facility], [place] [address] [place],  [address]  Surgical pathology     Status: None   Collection Time: 10/11/20 11:00 AM  Result Value Ref Range   SURGICAL PATHOLOGY      SURGICAL PATHOLOGY CASE: [id number] PATIENT: Jani Files Surgical Pathology  Report     Clinical History: Osteomyelitis, nonhealing foot ulcer (jmc)     FINAL MICROSCOPIC DIAGNOSIS:  A. METATARSAL, LEFT FOURTH, AMPUTATION: - Benign bone. - No acute osteomyelitis.  GROSS DESCRIPTION:  The specimen is received fresh and consists of a 2.2 x 1.7 x 1.3 cm piece of  tan-pink bone and surrounding soft tissue.  Representative section is submitted in 1 cassette following decalcification.  Craig Staggers 10/12/2020)    Final Diagnosis performed by Vicente Males, MD.   Electronically signed 10/13/2020 Technical and / or Professional components performed at [facility], [place] [address] [place], [place] [address].  Immunohistochemistry Technical component (if applicable) was performed at [facility]. [address] [place], [place], [place] [address].   IMMUNOHISTOCHEMISTRY DISCLAIMER (if applicable): Some of these immunohistochemica l stains may have been developed and the performance characteristics determine by [facility]. Some may not have been cleared or approved by the U.S. Food and Drug Administration. The FDA has determined that such clearance or approval is not necessary. This test is used for clinical purposes. It should not be regarded as investigational or for research. This laboratory is certified under the [place] (CLIA-88) as qualified to perform high complexity clinical laboratory testing.  The controls stained appropriately.   Aerobic/Anaerobic Culture w Gram Stain (surgical/deep wound)     Status: None   Collection Time: 10/11/20 11:06 AM   Specimen: Wound  Result Value Ref Range   Specimen Description      WOUND LEFT FOOT Performed at [place] [address] [place], [place] [address]    Special Requests      PATIENT ON FOLLOWING Cephas Darby Performed at [facility], [place] [address] [place], [place] [address]     Gram Stain NO WBC SEEN RARE GRAM POSITIVE COCCI     Culture      RARE PROTEUS MIRABILIS RARE STAPHYLOCOCCUS COHNII NO ANAEROBES ISOLATED Performed at [facility], [place] [address], [place], [place] [address]    Report Status 10/17/2020 FINAL    Organism ID, Bacteria PROTEUS MIRABILIS    Organism ID, Bacteria STAPHYLOCOCCUS COHNII       Susceptibility   Proteus mirabilis - MIC*    AMPICILLIN <=2 SENSITIVE Sensitive     CEFAZOLIN <=4 SENSITIVE Sensitive     CEFEPIME <=0.12 SENSITIVE Sensitive     CEFTAZIDIME <=1 SENSITIVE Sensitive     CEFTRIAXONE <=0.25 SENSITIVE Sensitive     CIPROFLOXACIN <=0.25 SENSITIVE Sensitive     GENTAMICIN <=1 SENSITIVE Sensitive     IMIPENEM 2 SENSITIVE Sensitive     TRIMETH/SULFA <=20 SENSITIVE Sensitive     AMPICILLIN/SULBACTAM <=2 SENSITIVE Sensitive     PIP/TAZO <=4 SENSITIVE Sensitive     * RARE PROTEUS MIRABILIS   Staphylococcus cohnii - MIC*    CIPROFLOXACIN <=0.5 SENSITIVE Sensitive     ERYTHROMYCIN 0.5 SENSITIVE Sensitive     GENTAMICIN <=0.5 SENSITIVE Sensitive     OXACILLIN 0.5 RESISTANT Resistant     TETRACYCLINE <=1 SENSITIVE Sensitive     VANCOMYCIN 1 SENSITIVE Sensitive     TRIMETH/SULFA <=10 SENSITIVE Sensitive     CLINDAMYCIN <=0.25 SENSITIVE Sensitive     RIFAMPIN <=0.5 SENSITIVE Sensitive     Inducible Clindamycin NEGATIVE Sensitive     * RARE STAPHYLOCOCCUS COHNII  Renal function panel     Status: Abnormal   Collection Time: 10/12/20  4:00 AM  Result Value Ref Range   Sodium 137 135 - 145 mmol/L   Potassium 3.6 3.5 - 5.1 mmol/L   Chloride 106 98 - 111 mmol/L   CO2 25 22 - 32 mmol/L   Glucose, Bld 93 70 - 99 mg/dL    Comment: Glucose reference range applies only to samples taken after  fasting for at least 8 hours.   BUN 8 6 - 20 mg/dL   Creatinine, Ser 0.61 0.61 - 1.24 mg/dL   Calcium 9.1 8.9 - 10.3 mg/dL   Phosphorus 1.9 (L) 2.5 - 4.6 mg/dL   Albumin 2.5 (L) 3.5 - 5.0 g/dL   GFR, Estimated >60 >60 mL/min     Comment: (NOTE) Calculated using the CKD-EPI Creatinine Equation (2021)    Anion gap 6 5 - 15    Comment: Performed at [facility], [place] [address] [place], [place] [address]  CBC     Status: Abnormal   Collection Time: 10/12/20  4:00 AM  Result Value Ref Range   WBC 10.1 4.0 - 10.5 K/uL   RBC 3.80 (L) 4.22 - 5.81 MIL/uL   Hemoglobin 9.2 (L) 13.0 - 17.0 g/dL   HCT 29.9 (L) 39.0 - 52.0 %   MCV 78.7 (L) 80.0 - 100.0 fL   MCH 24.2 (L) 26.0 - 34.0 pg   MCHC 30.8 30.0 - 36.0 g/dL   RDW 15.9 (H) 11.5 - 15.5 %   Platelets 277 150 - 400 K/uL   nRBC 0.0 0.0 - 0.2 %    Comment: Performed at [facility], [place] [address], [place], [place] [address]  Magnesium     Status: Abnormal   Collection Time: 10/12/20  4:00 AM  Result Value Ref Range   Magnesium 1.6 (L) 1.7 - 2.4 mg/dL    Comment: Performed at [facility], [place] [address] [place], [place] [address]  CBC     Status: Abnormal   Collection Time: 10/14/20  3:21 AM  Result Value Ref Range   WBC 7.0 4.0 - 10.5 K/uL   RBC 3.49 (L) 4.22 - 5.81 MIL/uL   Hemoglobin 8.5 (L) 13.0 - 17.0 g/dL   HCT 27.5 (L) 39.0 - 52.0 %   MCV 78.8 (L) 80.0 - 100.0 fL   MCH 24.4 (L) 26.0 - 34.0 pg   MCHC 30.9 30.0 - 36.0 g/dL   RDW 16.0 (H) 11.5 - 15.5 %   Platelets 284 150 - 400 K/uL   nRBC 0.0 0.0 - 0.2 %    Comment: Performed at [facility], [place] [address] [place], [place] [id number]  Basic metabolic panel     Status: Abnormal   Collection Time: 10/14/20  3:21 AM  Result Value Ref Range   Sodium 137 135 - 145 mmol/L   Potassium 4.3 3.5 - 5.1 mmol/L   Chloride 108 98 - 111 mmol/L   CO2 22 22 - 32 mmol/L   Glucose, Bld 107 (H) 70 - 99 mg/dL    Comment: Glucose reference range applies only to samples taken after fasting for at least 8 hours.   BUN 10 6 - 20 mg/dL   Creatinine, Ser 0.53 (L) 0.61 - 1.24 mg/dL   Calcium 9.1 8.9 - 10.3 mg/dL   GFR, Estimated >60 >60 mL/min     Comment: (NOTE) Calculated using the CKD-EPI Creatinine Equation (2021)    Anion gap 7 5 - 15    Comment: Performed at [facility], [place] [address], [place], [place] [address]  Magnesium     Status: None   Collection Time: 10/14/20  3:21 AM  Result Value Ref Range   Magnesium 1.8 1.7 - 2.4 mg/dL    Comment: Performed at [facility], [place] [address], [place], [place] [address]  Glucose, capillary     Status:  Abnormal   Collection Time: 10/14/20  4:58 PM  Result Value Ref Range   Glucose-Capillary 115 (H) 70 - 99 mg/dL    Comment: Glucose reference range applies only to samples taken after fasting for at least 8 hours.  Ethanol     Status: None   Collection Time: 10/14/20  6:53 PM  Result Value Ref Range   Alcohol, Ethyl (B) <10 <10 mg/dL    Comment: (NOTE) Lowest detectable limit for serum alcohol is 10 mg/dL.  For medical purposes only. Performed at [facility], [place] [address], [place], [place] [address]   Troponin I (High Sensitivity)     Status: Abnormal   Collection Time: 10/14/20  8:41 PM  Result Value Ref Range   Troponin I (High Sensitivity) 26 (H) <18 ng/L    Comment: (NOTE) Elevated high sensitivity troponin I (hsTnI) values and significant  changes across serial measurements may suggest ACS but many other  chronic and acute conditions are known to elevate hsTnI results.  Refer to the "Links" section for chest pain algorithms and additional  guidance. Performed at [facility], [place] [address] [place], [place] [address]   MRSA Next Gen by PCR, Nasal     Status: None   Collection Time: 10/14/20  8:55 PM   Specimen: Nasal Mucosa; Nasal Swab  Result Value Ref Range   MRSA by PCR Next Gen NOT DETECTED NOT DETECTED    Comment: (NOTE) The GeneXpert MRSA Assay (FDA approved for NASAL specimens only), is one component of a comprehensive MRSA colonization surveillance program. It is not intended to diagnose MRSA  infection nor to guide or monitor treatment for MRSA infections. Test performance is not FDA approved in patients less than 37 years old. Performed at [facility], [place] [address], [place], [place] [address]   I-STAT 7, (LYTES, BLD GAS, ICA, H+H)     Status: Abnormal   Collection Time: 10/14/20  9:57 PM  Result Value Ref Range   pH, Arterial 7.483 (H) 7.350 - 7.450   pCO2 arterial 37.5 32.0 - 48.0 mmHg   pO2, Arterial 548 (H) 83.0 - 108.0 mmHg   Bicarbonate 28.1 (H) 20.0 - 28.0 mmol/L   TCO2 29 22 - 32 mmol/L   O2 Saturation 100.0 %   Acid-Base Excess 4.0 (H) 0.0 - 2.0 mmol/L   Sodium 139 135 - 145 mmol/L   Potassium 3.8 3.5 - 5.1 mmol/L   Calcium, Ion 1.26 1.15 - 1.40 mmol/L   HCT 24.0 (L) 39.0 - 52.0 %   Hemoglobin 8.2 (L) 13.0 - 17.0 g/dL   Patient temperature 98.6 F    Collection site Radial    Drawn by RT    Sample type ARTERIAL   Troponin I (High Sensitivity)     Status: Abnormal   Collection Time: 10/14/20 11:03 PM  Result Value Ref Range   Troponin I (High Sensitivity) 25 (H) <18 ng/L    Comment: (NOTE) Elevated high sensitivity troponin I (hsTnI) values and significant  changes across serial measurements may suggest ACS but many other  chronic and acute conditions are known to elevate hsTnI results.  Refer to the "Links" section for chest pain algorithms and additional  guidance. Performed at [facility], [place] [address] [place], [place] [address]   Troponin I (High Sensitivity)     Status: Abnormal   Collection Time: 10/15/20 12:36 AM  Result Value Ref Range   Troponin I (High Sensitivity) 25 (H) <  18 ng/L    Comment: (NOTE) Elevated high sensitivity troponin I (hsTnI) values and significant  changes across serial measurements may suggest ACS but many other  chronic and acute conditions are known to elevate hsTnI results.  Refer to the "Links" section for chest pain algorithms and additional  guidance. Performed at [facility], [place]  [address], [place], [place] [address]   Troponin I (High Sensitivity)     Status: Abnormal   Collection Time: 10/15/20  2:52 AM  Result Value Ref Range   Troponin I (High Sensitivity) 25 (H) <18 ng/L    Comment: (NOTE) Elevated high sensitivity troponin I (hsTnI) values and significant  changes across serial measurements may suggest ACS but many other  chronic and acute conditions are known to elevate hsTnI results.  Refer to the "Links" section for chest pain algorithms and additional  guidance. Performed at [facility], [place] [address] [place], [place] [address]   Urine rapid drug screen (hosp performed)not at [facility]     Status: Abnormal   Collection Time: 10/15/20  4:08 AM  Result Value Ref Range   Opiates POSITIVE (A) NONE DETECTED   Cocaine NONE DETECTED NONE DETECTED   Benzodiazepines NONE DETECTED NONE DETECTED   Amphetamines NONE DETECTED NONE DETECTED   Tetrahydrocannabinol NONE DETECTED NONE DETECTED   Barbiturates NONE DETECTED NONE DETECTED    Comment: (NOTE) DRUG SCREEN FOR MEDICAL PURPOSES ONLY.  IF CONFIRMATION IS NEEDED FOR ANY PURPOSE, NOTIFY LAB WITHIN 5 DAYS.  LOWEST DETECTABLE LIMITS FOR URINE DRUG SCREEN Drug Class                     Cutoff (ng/mL) Amphetamine and metabolites    1000 Barbiturate and metabolites    200 Benzodiazepine                 [id number] Tricyclics and metabolites     300 Opiates and metabolites        300 Cocaine and metabolites        300 THC                            50 Performed at [facility], [place] [address] [place], [place] [address]   Urinalysis, Complete w Microscopic     Status: Abnormal   Collection Time: 10/15/20  4:08 AM  Result Value Ref Range   Color, Urine YELLOW YELLOW   APPearance CLEAR CLEAR   Specific Gravity, Urine >1.046 (H) 1.005 - 1.030   pH 5.0 5.0 - 8.0   Glucose, UA NEGATIVE NEGATIVE mg/dL   Hgb urine dipstick NEGATIVE NEGATIVE   Bilirubin Urine NEGATIVE NEGATIVE   Ketones, ur NEGATIVE NEGATIVE  mg/dL   Protein, ur NEGATIVE NEGATIVE mg/dL   Nitrite NEGATIVE NEGATIVE   Leukocytes,Ua NEGATIVE NEGATIVE   RBC / HPF 0-5 0 - 5 RBC/hpf   WBC, UA 0-5 0 - 5 WBC/hpf   Bacteria, UA NONE SEEN NONE SEEN   Squamous Epithelial / LPF 0-5 0 - 5    Comment: Performed at [facility], [place] [address], [place], [place] [address]  Magnesium     Status: None   Collection Time: 10/15/20  5:00 AM  Result Value Ref Range   Magnesium 1.8 1.7 - 2.4 mg/dL    Comment: Performed at [facility] [address] [place], [place] [address]  Hemoglobin A1c  Status: None   Collection Time: 10/15/20  5:00 AM  Result Value Ref Range   Hgb A1c MFr Bld 5.5 4.8 - 5.6 %    Comment: (NOTE) Pre diabetes:          5.7%-6.4%  Diabetes:              >6.4%  Glycemic control for   <7.0% adults with diabetes    Mean Plasma Glucose 111.15 mg/dL    Comment: Performed at [facility] [address], [place], [place] [address]  Lipid panel     Status: Abnormal   Collection Time: 10/15/20  5:00 AM  Result Value Ref Range   Cholesterol 160 0 - 200 mg/dL   Triglycerides 103 <150 mg/dL   HDL 38 (L) >40 mg/dL   Total CHOL/HDL Ratio 4.2 RATIO   VLDL 21 0 - 40 mg/dL   LDL Cholesterol 101 (H) 0 - 99 mg/dL    Comment:        Total Cholesterol/HDL:CHD Risk Coronary Heart Disease Risk Table                     Men   Women  1/2 Average Risk   3.4   3.3  Average Risk       5.0   4.4  2 X Average Risk   9.6   7.1  3 X Average Risk  23.4   11.0        Use the calculated Patient Ratio above and the CHD Risk Table to determine the patient's CHD Risk.        ATP III CLASSIFICATION (LDL):  <100     mg/dL   Optimal  100-129  mg/dL   Near or Above                    Optimal  130-159  mg/dL   Borderline  160-189  mg/dL   High  >190     mg/dL   Very High Performed at [facility] [address] [place], [place] [address]   Comprehensive metabolic panel     Status: Abnormal   Collection Time:  10/15/20  5:00 AM  Result Value Ref Range   Sodium 139 135 - 145 mmol/L   Potassium 3.8 3.5 - 5.1 mmol/L   Chloride 109 98 - 111 mmol/L   CO2 20 (L) 22 - 32 mmol/L   Glucose, Bld 88 70 - 99 mg/dL    Comment: Glucose reference range applies only to samples taken after fasting for at least 8 hours.   BUN 7 6 - 20 mg/dL   Creatinine, Ser 0.62 0.61 - 1.24 mg/dL   Calcium 9.2 8.9 - 10.3 mg/dL   Total Protein 6.1 (L) 6.5 - 8.1 g/dL   Albumin 2.4 (L) 3.5 - 5.0 g/dL   AST 30 15 - 41 U/L   ALT 21 0 - 44 U/L   Alkaline Phosphatase 69 38 - 126 U/L   Total Bilirubin 0.8 0.3 - 1.2 mg/dL   GFR, Estimated >60 >60 mL/min    Comment: (NOTE) Calculated using the CKD-EPI Creatinine Equation (2021)    Anion gap 10 5 - 15    Comment: Performed at [facility], [place] [address], [place], [place] [address]  CBC     Status: Abnormal   Collection Time: 10/15/20  5:00 AM  Result Value Ref Range   WBC 6.1 4.0 - 10.5 K/uL  RBC 3.72 (L) 4.22 - 5.81 MIL/uL   Hemoglobin 9.1 (L) 13.0 - 17.0 g/dL   HCT 29.4 (L) 39.0 - 52.0 %   MCV 79.0 (L) 80.0 - 100.0 fL   MCH 24.5 (L) 26.0 - 34.0 pg   MCHC 31.0 30.0 - 36.0 g/dL   RDW 16.1 (H) 11.5 - 15.5 %   Platelets 312 150 - 400 K/uL   nRBC 0.0 0.0 - 0.2 %    Comment: Performed at [facility] [address], [place], [place] [address]  Triglycerides     Status: None   Collection Time: 10/15/20  5:00 AM  Result Value Ref Range   Triglycerides 105 <150 mg/dL    Comment: Performed at [facility] [address], [place], [place] [address]  Heparin level (unfractionated)     Status: Abnormal   Collection Time: 10/15/20  5:51 AM  Result Value Ref Range   Heparin Unfractionated 0.10 (L) 0.30 - 0.70 IU/mL    Comment: (NOTE) The clinical reportable range upper limit is being lowered to >1.10 to align with the FDA approved guidance for the current laboratory assay.  If heparin results are below expected values, and patient dosage has  been  confirmed, suggest follow up testing of antithrombin III levels. Performed at [facility], [place] [address] [place], [place] [address]   Troponin I (High Sensitivity)     Status: Abnormal   Collection Time: 10/15/20  8:30 AM  Result Value Ref Range   Troponin I (High Sensitivity) 25 (H) <18 ng/L    Comment: (NOTE) Elevated high sensitivity troponin I (hsTnI) values and significant  changes across serial measurements may suggest ACS but many other  chronic and acute conditions are known to elevate hsTnI results.  Refer to the "Links" section for chest pain algorithms and additional  guidance. Performed at [facility], [place] [address], [place], [place] [address]   ECHOCARDIOGRAM LIMITED     Status: None   Collection Time: 10/15/20  9:06 AM  Result Value Ref Range   Weight 2,222.24 oz   Height 63 in   BP 130/61 mmHg   S' Lateral 5.00 cm   P 1/2 time 260 msec   Area-P 1/2 4.60 cm2  Basic metabolic panel     Status: Abnormal   Collection Time: 10/15/20  9:04 PM  Result Value Ref Range   Sodium 133 (L) 135 - 145 mmol/L   Potassium 4.6 3.5 - 5.1 mmol/L    Comment: NO VISIBLE HEMOLYSIS   Chloride 103 98 - 111 mmol/L   CO2 13 (L) 22 - 32 mmol/L   Glucose, Bld 65 (L) 70 - 99 mg/dL    Comment: Glucose reference range applies only to samples taken after fasting for at least 8 hours.   BUN 5 (L) 6 - 20 mg/dL   Creatinine, Ser 0.67 0.61 - 1.24 mg/dL   Calcium 9.2 8.9 - 10.3 mg/dL   GFR, Estimated >60 >60 mL/min    Comment: (NOTE) Calculated using the CKD-EPI Creatinine Equation (2021)    Anion gap 17 (H) 5 - 15    Comment: Performed at [facility] [address] [place] [address]  CBC     Status: Abnormal   Collection Time: 10/16/20  5:57 AM  Result Value Ref Range   WBC 12.0 (H) 4.0 - 10.5 K/uL   RBC 3.94 (L) 4.22 - 5.81 MIL/uL   Hemoglobin 9.7 (L) 13.0 -  17.0 g/dL   HCT 30.8 (L) 39.0 - 52.0 %   MCV 78.2 (L) 80.0 - 100.0 fL   MCH 24.6 (L) 26.0 -  34.0 pg   MCHC 31.5 30.0 - 36.0 g/dL   RDW 16.3 (H) 11.5 - 15.5 %   Platelets 318 150 - 400 K/uL   nRBC 0.0 0.0 - 0.2 %    Comment: Performed at [facility] [address] [place], [place] [id number]  Basic metabolic panel     Status: Abnormal   Collection Time: 10/16/20  5:57 AM  Result Value Ref Range   Sodium 137 135 - 145 mmol/L   Potassium 4.8 3.5 - 5.1 mmol/L   Chloride 105 98 - 111 mmol/L   CO2 19 (L) 22 - 32 mmol/L   Glucose, Bld 63 (L) 70 - 99 mg/dL    Comment: Glucose reference range applies only to samples taken after fasting for at least 8 hours.   BUN 5 (L) 6 - 20 mg/dL   Creatinine, Ser 0.68 0.61 - 1.24 mg/dL   Calcium 9.4 8.9 - 10.3 mg/dL   GFR, Estimated >60 >60 mL/min    Comment: (NOTE) Calculated using the CKD-EPI Creatinine Equation (2021)    Anion gap 13 5 - 15    Comment: Performed at [facility] [address], [place], [place] [address]  Magnesium     Status: None   Collection Time: 10/16/20  5:57 AM  Result Value Ref Range   Magnesium 2.0 1.7 - 2.4 mg/dL    Comment: Performed at [facility] [address] [place], [place] [address]  Troponin I (High Sensitivity)     Status: Abnormal   Collection Time: 10/16/20 10:52 AM  Result Value Ref Range   Troponin I (High Sensitivity) 26 (H) <18 ng/L    Comment: (NOTE) Elevated high sensitivity troponin I (hsTnI) values and significant  changes across serial measurements may suggest ACS but many other  chronic and acute conditions are known to elevate hsTnI results.  Refer to the "Links" section for chest pain algorithms and additional  guidance. Performed at [facility], [place] [address], [place], [place] [address]   Troponin I (High Sensitivity)     Status: Abnormal   Collection Time: 10/16/20 12:25 PM  Result Value Ref Range   Troponin I (High Sensitivity) 30 (H) <18 ng/L    Comment: (NOTE) Elevated high sensitivity troponin I (hsTnI) values and significant  changes across  serial measurements may suggest ACS but many other  chronic and acute conditions are known to elevate hsTnI results.  Refer to the "Links" section for chest pain algorithms and additional  guidance. Performed at [facility], [place] [address] [place], [place] [address]   CBC     Status: Abnormal   Collection Time: 10/17/20  1:05 AM  Result Value Ref Range   WBC 7.5 4.0 - 10.5 K/uL   RBC 4.02 (L) 4.22 - 5.81 MIL/uL   Hemoglobin 10.0 (L) 13.0 - 17.0 g/dL   HCT 30.4 (L) 39.0 - 52.0 %   MCV 75.6 (L) 80.0 - 100.0 fL   MCH 24.9 (L) 26.0 - 34.0 pg   MCHC 32.9 30.0 - 36.0 g/dL   RDW 16.5 (H) 11.5 - 15.5 %   Platelets 285 150 - 400 K/uL    Comment: SPECIMEN CHECKED FOR CLOTS REPEATED TO VERIFY    nRBC 0.0 0.0 - 0.2 %    Comment:  Performed at [facility], [place] [address], [place], [place] [id number]  Basic metabolic panel     Status: Abnormal   Collection Time: 10/17/20  1:05 AM  Result Value Ref Range   Sodium 137 135 - 145 mmol/L   Potassium 5.7 (H) 3.5 - 5.1 mmol/L   Chloride 108 98 - 111 mmol/L   CO2 15 (L) 22 - 32 mmol/L   Glucose, Bld 92 70 - 99 mg/dL    Comment: Glucose reference range applies only to samples taken after fasting for at least 8 hours.   BUN 9 6 - 20 mg/dL   Creatinine, Ser 0.69 0.61 - 1.24 mg/dL   Calcium 9.3 8.9 - 10.3 mg/dL   GFR, Estimated >60 >60 mL/min    Comment: (NOTE) Calculated using the CKD-EPI Creatinine Equation (2021)    Anion gap 14 5 - 15    Comment: Performed at [facility] [address] [place], [place] [address]  Glucose, capillary     Status: None   Collection Time: 10/17/20  7:45 AM  Result Value Ref Range   Glucose-Capillary 91 70 - 99 mg/dL    Comment: Glucose reference range applies only to samples taken after fasting for at least 8 hours.   Comment 1 Notify RN    Comment 2 Document in Chart   Potassium     Status: Abnormal   Collection Time: 10/17/20  9:13 AM  Result Value Ref Range   Potassium 3.4 (L) 3.5 -  5.1 mmol/L    Comment: Performed at [facility] [address], [place], [place] [address]  CBC     Status: Abnormal   Collection Time: 10/18/20 12:20 AM  Result Value Ref Range   WBC 6.1 4.0 - 10.5 K/uL   RBC 3.83 (L) 4.22 - 5.81 MIL/uL   Hemoglobin 9.4 (L) 13.0 - 17.0 g/dL   HCT 30.0 (L) 39.0 - 52.0 %   MCV 78.3 (L) 80.0 - 100.0 fL   MCH 24.5 (L) 26.0 - 34.0 pg   MCHC 31.3 30.0 - 36.0 g/dL   RDW 16.7 (H) 11.5 - 15.5 %   Platelets 310 150 - 400 K/uL   nRBC 0.0 0.0 - 0.2 %    Comment: Performed at [facility] [address], [place], [place] [address]  Magnesium     Status: None   Collection Time: 10/18/20 12:20 AM  Result Value Ref Range   Magnesium 1.7 1.7 - 2.4 mg/dL    Comment: Performed at [facility] [address] [place], [place] [id number]  Basic metabolic panel     Status: Abnormal   Collection Time: 10/18/20 12:20 AM  Result Value Ref Range   Sodium 136 135 - 145 mmol/L   Potassium 4.1 3.5 - 5.1 mmol/L   Chloride 107 98 - 111 mmol/L   CO2 22 22 - 32 mmol/L   Glucose, Bld 115 (H) 70 - 99 mg/dL    Comment: Glucose reference range applies only to samples taken after fasting for at least 8 hours.   BUN 6 6 - 20 mg/dL   Creatinine, Ser 0.55 (L) 0.61 - 1.24 mg/dL   Calcium 9.1 8.9 - 10.3 mg/dL   GFR, Estimated >60 >60 mL/min    Comment: (NOTE) Calculated using the CKD-EPI Creatinine Equation (2021)    Anion gap 7 5 - 15    Comment: Performed at [facility] [place],  [place] [address]  Comprehensive metabolic panel     Status: Abnormal   Collection Time: 10/20/20  4:34 AM  Result Value Ref Range   Sodium 136 135 - 145 mmol/L   Potassium 4.0 3.5 - 5.1 mmol/L   Chloride 105 98 - 111 mmol/L   CO2 24 22 - 32 mmol/L   Glucose, Bld 108 (H) 70 - 99 mg/dL    Comment: Glucose reference range applies only to samples taken after fasting for at least 8 hours.   BUN 8 6 - 20 mg/dL   Creatinine, Ser 0.48 (L) 0.61 - 1.24 mg/dL    Calcium 9.5 8.9 - 10.3 mg/dL   Total Protein 5.5 (L) 6.5 - 8.1 g/dL   Albumin 2.4 (L) 3.5 - 5.0 g/dL   AST 27 15 - 41 U/L   ALT 11 0 - 44 U/L   Alkaline Phosphatase 65 38 - 126 U/L   Total Bilirubin 0.3 0.3 - 1.2 mg/dL   GFR, Estimated >60 >60 mL/min    Comment: (NOTE) Calculated using the CKD-EPI Creatinine Equation (2021)    Anion gap 7 5 - 15    Comment: Performed at [facility], [place] [address] [place], [place] [address]  CBC WITH DIFFERENTIAL     Status: Abnormal   Collection Time: 10/20/20  4:34 AM  Result Value Ref Range   WBC 6.2 4.0 - 10.5 K/uL   RBC 3.78 (L) 4.22 - 5.81 MIL/uL   Hemoglobin 9.3 (L) 13.0 - 17.0 g/dL   HCT 29.9 (L) 39.0 - 52.0 %   MCV 79.1 (L) 80.0 - 100.0 fL   MCH 24.6 (L) 26.0 - 34.0 pg   MCHC 31.1 30.0 - 36.0 g/dL   RDW 17.0 (H) 11.5 - 15.5 %   Platelets 308 150 - 400 K/uL   nRBC 0.0 0.0 - 0.2 %   Neutrophils Relative % 63 %   Neutro Abs 4.0 1.7 - 7.7 K/uL   Lymphocytes Relative 17 %   Lymphs Abs 1.0 0.7 - 4.0 K/uL   Monocytes Relative 10 %   Monocytes Absolute 0.6 0.1 - 1.0 K/uL   Eosinophils Relative 8 %   Eosinophils Absolute 0.5 0.0 - 0.5 K/uL   Basophils Relative 1 %   Basophils Absolute 0.0 0.0 - 0.1 K/uL   Immature Granulocytes 1 %   Abs Immature Granulocytes 0.06 0.00 - 0.07 K/uL    Comment: Performed at [facility], [address] [place], [place] [address]  Magnesium     Status: Abnormal   Collection Time: 10/26/20  5:46 AM  Result Value Ref Range   Magnesium 1.6 (L) 1.7 - 2.4 mg/dL    Comment: Performed at [facility] [address] [place], [place] [id number]  Basic metabolic panel     Status: Abnormal   Collection Time: 10/26/20  5:46 AM  Result Value Ref Range   Sodium 135 135 - 145 mmol/L   Potassium 3.9 3.5 - 5.1 mmol/L   Chloride 105 98 - 111 mmol/L   CO2 24 22 - 32 mmol/L   Glucose, Bld 97 70 - 99 mg/dL    Comment: Glucose reference range applies only to samples taken after fasting for at least 8  hours.   BUN 17 6 - 20 mg/dL   Creatinine, Ser 0.57 (L) 0.61 - 1.24 mg/dL   Calcium 9.6 8.9 - 10.3 mg/dL   GFR, Estimated >60 >60 mL/min    Comment: (NOTE) Calculated using  the CKD-EPI Creatinine Equation (2021)    Anion gap 6 5 - 15    Comment: Performed at [facility], [place] [address] [place], [place] [address]  VITAMIN D 25 Hydroxy (Vit-D Deficiency, Fractures)     Status: Abnormal   Collection Time: 10/26/20  5:46 AM  Result Value Ref Range   Vit D, 25-Hydroxy 18.96 (L) 30 - 100 ng/mL    Comment: (NOTE) Vitamin D deficiency has been defined by the Institute of Medicine  and an Endocrine Society practice guideline as a level of serum 25-OH  vitamin D less than 20 ng/mL (1,2). The Endocrine Society went on to  further define vitamin D insufficiency as a level between 21 and 29  ng/mL (2).  1. IOM (Institute of Medicine). 2010. Dietary reference intakes for  calcium and D. [place]: The [employer]. 2. Holick MF, Binkley [place], Bischoff-Ferrari HA, et al. Evaluation,  treatment, and prevention of vitamin D deficiency: an Endocrine  Society clinical practice guideline, JCEM. 2011 Jul; 96(7): 1911-30.  Performed at [facility], [place] [address] [place], [place] [address]   Iron and TIBC     Status: Abnormal   Collection Time: 10/26/20  5:46 AM  Result Value Ref Range   Iron 26 (L) 45 - 182 ug/dL   TIBC 230 (L) 250 - 450 ug/dL   Saturation Ratios 11 (L) 17.9 - 39.5 %   UIBC 204 ug/dL    Comment: Performed at [facility], [place] [address], [place], [place] [address]  Magnesium     Status: None   Collection Time: 10/27/20  5:21 AM  Result Value Ref Range   Magnesium 1.9 1.7 - 2.4 mg/dL    Comment: Performed at [facility] [address] [place], [place] [id number]  Basic metabolic panel     Status: Abnormal   Collection Time: 10/27/20  5:21 AM  Result Value Ref Range   Sodium 133 (L) 135 - 145 mmol/L   Potassium 4.0 3.5 - 5.1 mmol/L    Chloride 103 98 - 111 mmol/L   CO2 25 22 - 32 mmol/L   Glucose, Bld 104 (H) 70 - 99 mg/dL    Comment: Glucose reference range applies only to samples taken after fasting for at least 8 hours.   BUN 20 6 - 20 mg/dL   Creatinine, Ser 0.44 (L) 0.61 - 1.24 mg/dL   Calcium 9.8 8.9 - 10.3 mg/dL   GFR, Estimated >60 >60 mL/min    Comment: (NOTE) Calculated using the CKD-EPI Creatinine Equation (2021)    Anion gap 5 5 - 15    Comment: Performed at [facility] [address] [place], [place] [address]    Objective: There were no vitals filed for this visit.  General: Patient is awake, alert, oriented x 3 and in no acute distress.  Dermatology: Skin is warm and dry bilateral with a ----- thickness ulceration present  <location>. Ulceration measures --- cm x --- cm x --- cm. There is a  <description> border with a <description> base. The ulceration does/does not  probe to bone. There is no malodor, no active drainage, no erythema, no edema. No acute signs of infection.   Vascular: Dorsalis Pedis pulse = /4 Bilateral,  Posterior Tibial pulse = /4 Bilateral,  Capillary Fill Time < 5 seconds  Neurologic: Protective sensation intact/diminished/absent to the level of ------- using  the 5.07/10g Semmes Weinstein Monofilament.  Musculosketal: There is  decreased ankle joint range of motion Bilateral. There is decreased Subtalar joint range of motion Bilateral. There is a decrease in 1st metatarsophalangeal joint range of motion Bilateral. No Pain with palpation to ulcerated area. No pain with compression to calves bilateral. No gross bony deformities noted bilateral.  Xrays, Left/Right foot:--- No bony destruction suggestive of osteomyelitis. No gas in soft tissues.   Recent Labs    10/11/20 1106  GRAMSTAIN NO WBC SEEN RARE GRAM POSITIVE COCCI   LABORGA PROTEUS MIRABILIS  STAPHYLOCOCCUS COHNII    Assessment and Plan:  Problem List Items Addressed This Visit        Cardiovascular and Mediastinum   Venous insufficiency     Musculoskeletal and Integument   Rheumatoid arthritis ([facility])   Other Visit Diagnoses     Skin ulcer of left foot with fat layer exposed ([place])    -  Primary        -Examined patient and discussed the progression of the wound and treatment alternatives. -Xrays reviewed - Excisionally dedbrided ulceration at ** to healthy bleeding borders removing nonviable tissue using a sterile chisel blade. Wound measures post debridement ** cm. Wound was debrided to the level of the dermis with viable wound base exposed to promote healing. Hemostasis was achieved with manuel pressure. Patient tolerated procedure well without any discomfort or anesthesia necessary for this wound debridement.  -Applied ----- and dry sterile dressing and instructed patient to continue with daily dressings at home consisting of --- and bandaid/dry sterile dressing. - Advised patient to go to the ER or return to office if the wound worsens or if constitutional symptoms are present. -Patient to return to office in -- week for follow up care and evaluation or sooner if problems arise.  Landis Martins, DPM

## 2020-11-20 ENCOUNTER — Telehealth: Payer: Self-pay | Admitting: Medical

## 2020-11-20 ENCOUNTER — Telehealth: Payer: Self-pay | Admitting: Sports Medicine

## 2020-11-20 NOTE — Telephone Encounter (Signed)
Anissa calling to request new wound orders for a new wound that had come up on the patients right ankle.

## 2020-11-20 NOTE — Telephone Encounter (Signed)
   [facility] called with critical EKG. Patient had an 8 beat run of NSVT at a rate of 210bpm with restoration of NSR. This was an Best boy. Favor ongoing watchful waiting.  Abigail Butts, PA-C 11/20/20; 8:20 PM

## 2020-11-23 ENCOUNTER — Telehealth: Payer: Self-pay | Admitting: Student in an Organized Health Care Education/Training Program

## 2020-11-23 NOTE — Telephone Encounter (Signed)
Paged alert value by [facility] of reported VT. Reviewed strips and WCT at HR ~160 for 11 seconds. Considered whether this could be AF with aberrancy given there is some irregularity however morphology is exactly the same as PVCs when in NSR so favor it to be true NSVT. Called patient and reached family member, he is asx and doing fine currently. No changes recommended currently with this short run.

## 2020-11-23 NOTE — Telephone Encounter (Signed)
I was paged at ~8:10 pm regarding the patient having a 6 second run of wide complex tachycardia that spontaneously converted to a NSR. I called the patient and his wife. Both denied that the patient having any symptoms. He feels fine and denied chest pain, palpitations, syncope, presyncope or SOB. I encouraged them to come to the ED if he develops any of these symptoms. They verbalized an understanding.

## 2020-11-24 ENCOUNTER — Telehealth: Payer: Self-pay | Admitting: *Deleted

## 2020-11-24 NOTE — Telephone Encounter (Signed)
Called [facility] and gave verbal orders for wound care, spoke with Cecille Rubin, verballized, also faxed order, received confirmation.

## 2020-12-03 ENCOUNTER — Encounter: Payer: Medicare Other | Admitting: Sports Medicine

## 2020-12-04 ENCOUNTER — Ambulatory Visit: Payer: Medicare Other | Admitting: Cardiology

## 2020-12-08 ENCOUNTER — Inpatient Hospital Stay: Payer: Medicare Other | Admitting: Adult Health

## 2020-12-24 DEATH — deceased

## 2021-02-05 ENCOUNTER — Encounter: Payer: Medicare Other | Admitting: Physical Medicine and Rehabilitation

## 2021-02-10 ENCOUNTER — Ambulatory Visit: Payer: Medicare Other | Admitting: Podiatry

## 2021-11-04 IMAGING — MR MR FOOT*L* WO/W CM
10 of 12 series · 38 of 40 positions shown · IV contrast (6ml GADAVIST)
Comparison: 10/09/2020, 12/22/2018

CLINICAL DATA: Ulcer between left second and third metatarsals,
abnormal x-ray

EXAM:
MRI OF THE LEFT FOREFOOT WITHOUT AND WITH CONTRAST
TECHNIQUE: Multiplanar, multisequence MR imaging of the left foot was performed
both before and after administration of intravenous contrast.
CONTRAST:  6mL GADAVIST GADOBUTROL 1 MMOL/ML IV SOLN

[Series 3: T1 · coronal · left · 3.0mm · 0.47mm/px · 4 of 46 slices shown (1 of 2)]
[im 1/46]
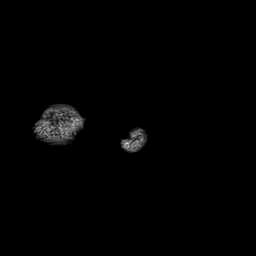
[im 16/46]
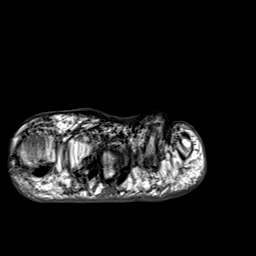
[im 31/46]
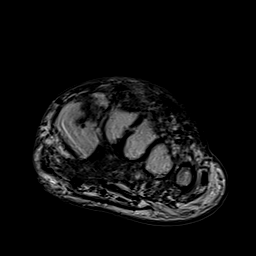
[im 46/46]
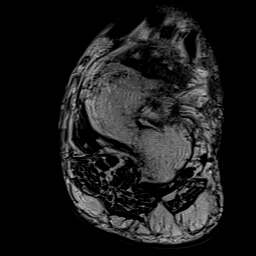

[Series 4: T2 fat-sat · coronal · left · 3.0mm · 0.38mm/px · 4 of 46 slices shown (1 of 3)]
[im 1/46]
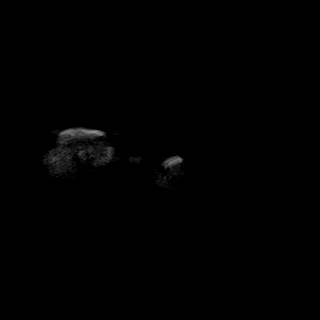
[im 16/46]
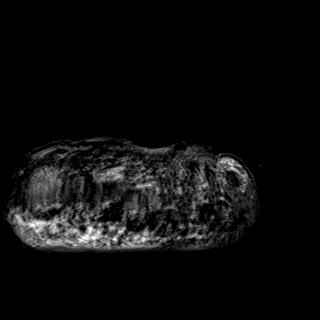
[im 31/46]
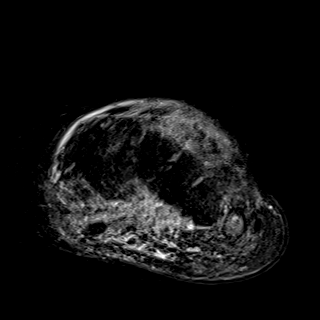
[im 46/46]
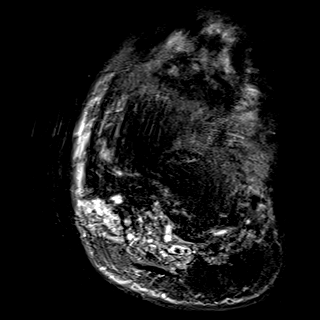

[Series 5: T2 fat-sat · axial · left · 3.0mm · 0.70mm/px · z∈[-43,+33]mm · 3 of 26 slices shown (2 of 3)]
[im 1/26]
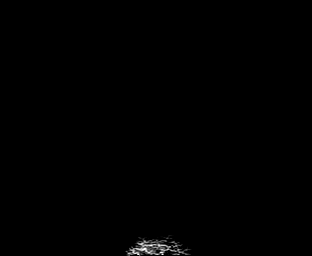
[im 13/26]
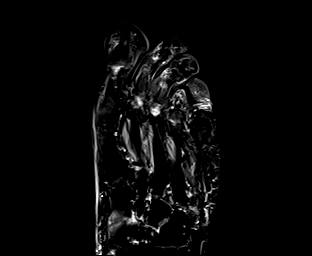
[im 26/26]
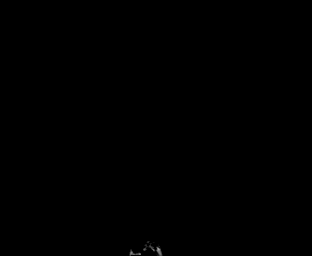

[Series 6: T1 · axial · left · 3.0mm · 0.70mm/px · z∈[-43,+33]mm · 3 of 26 slices shown (2 of 2)]
[im 1/26]
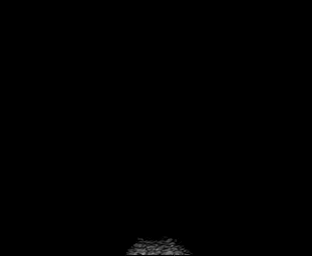
[im 13/26]
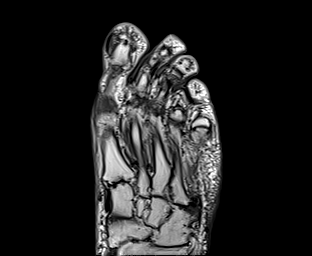
[im 26/26]
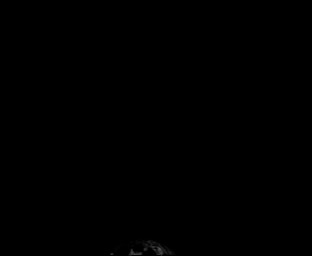

[Series 7: STIR · sagittal · left · 3.0mm · 0.35mm/px · 3 of 29 slices shown]
[im 1/29]
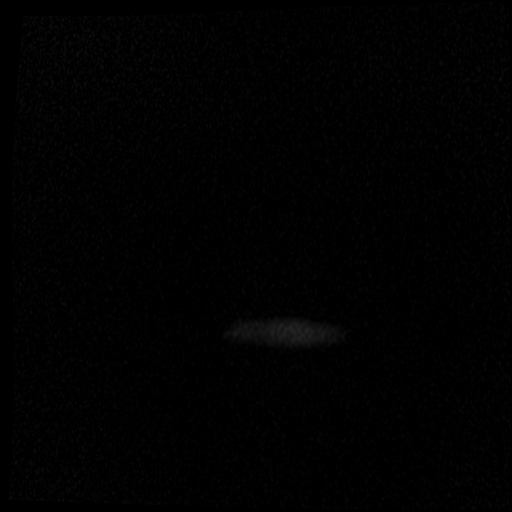
[im 15/29]
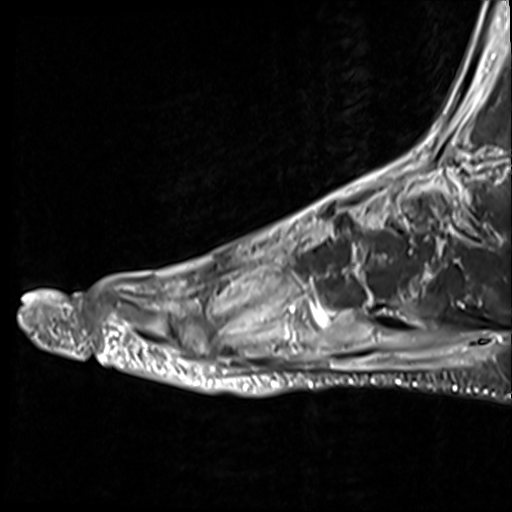
[im 29/29]
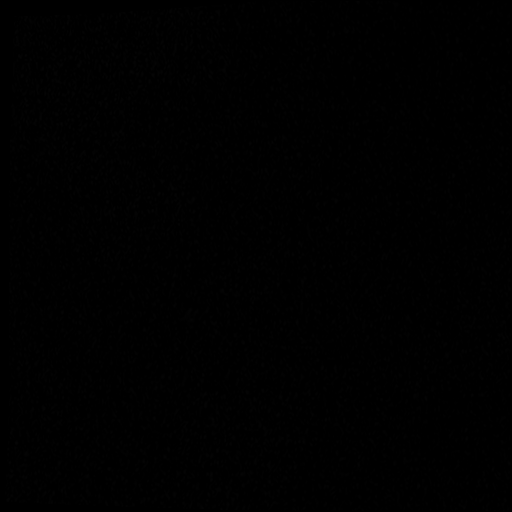

[Series 8: T1 fat-sat · coronal · non-contrast · left · 3.0mm · 0.47mm/px · 5 of 46 slices shown]
[im 1/46]
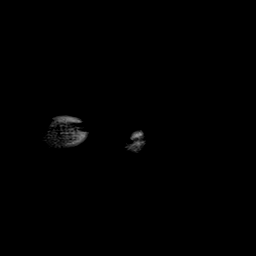
[im 12/46]
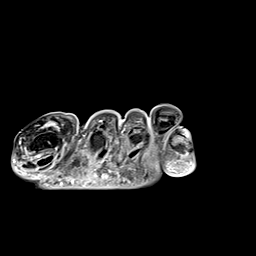
[im 23/46]
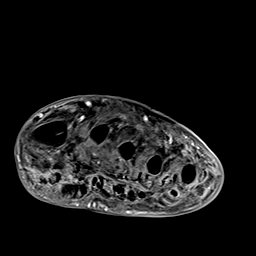
[im 34/46]
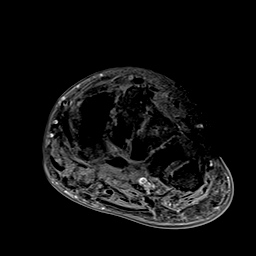
[im 46/46]
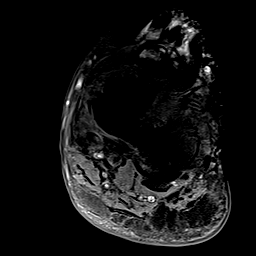

[Series 9: T2 fat-sat · coronal · left · 3.0mm · 0.38mm/px · 5 of 46 slices shown (3 of 3)]
[im 1/46]
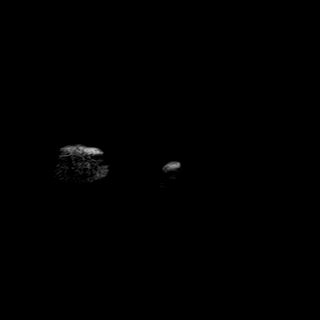
[im 12/46]
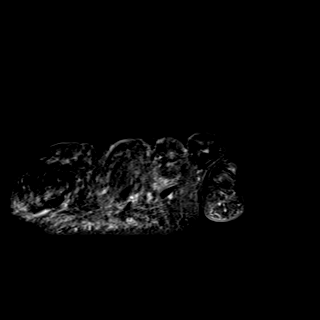
[im 23/46]
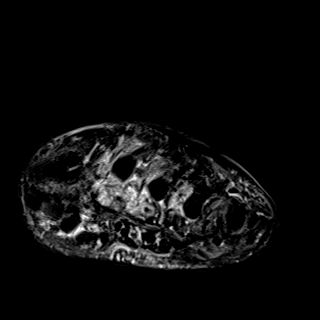
[im 34/46]
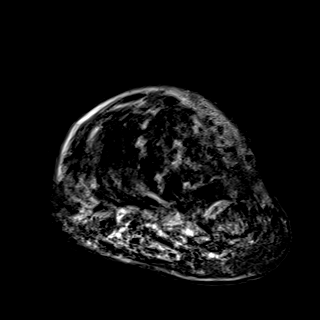
[im 46/46]
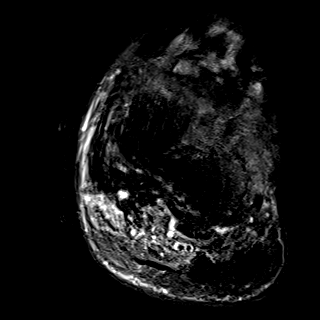

[Series 10: T1 fat-sat post-contrast · coronal · left · 3.0mm · 0.47mm/px · 5 of 45 slices shown (1 of 3)]
[im 1/45]
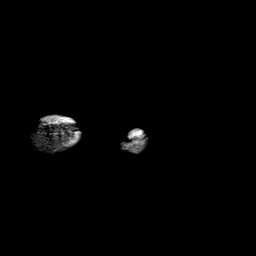
[im 12/45]
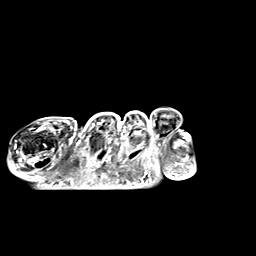
[im 23/45]
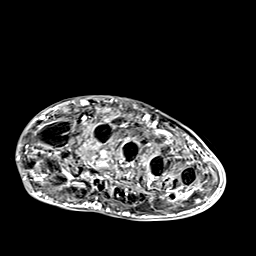
[im 34/45]
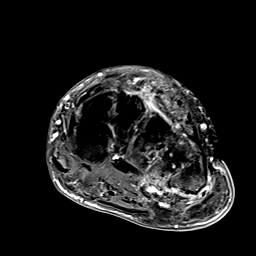
[im 45/45]
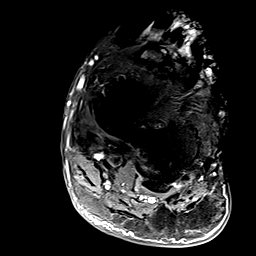

[Series 11: T1 fat-sat post-contrast · sagittal · left · 3.0mm · 0.35mm/px · 3 of 29 slices shown (2 of 3)]
[im 1/29]
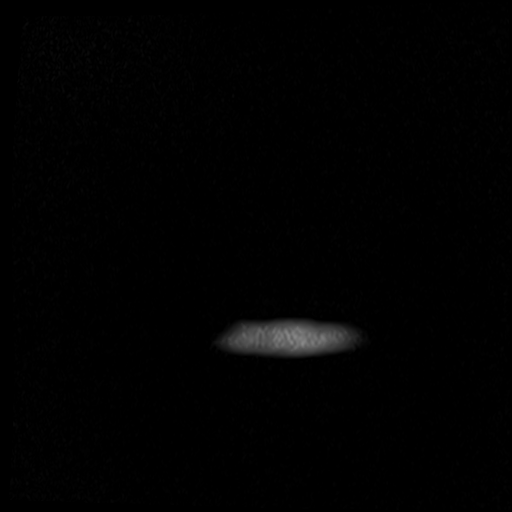
[im 15/29]
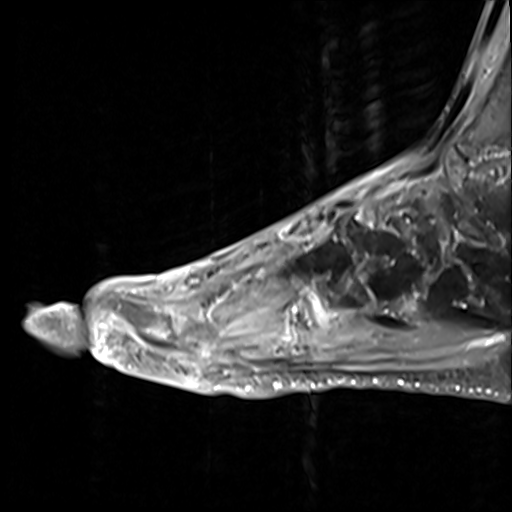
[im 29/29]
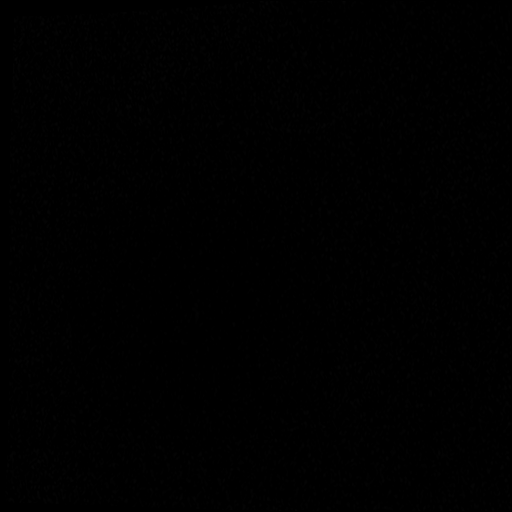

[Series 12: T1 fat-sat post-contrast · axial · left · 3.0mm · 0.56mm/px · z∈[-49,+27]mm · 3 of 26 slices shown (3 of 3)]
[im 1/26]
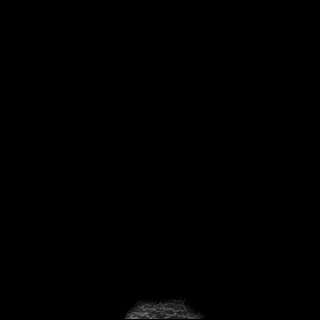
[im 13/26]
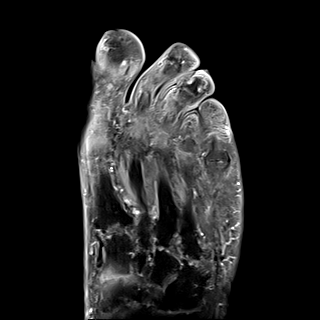
[im 26/26]
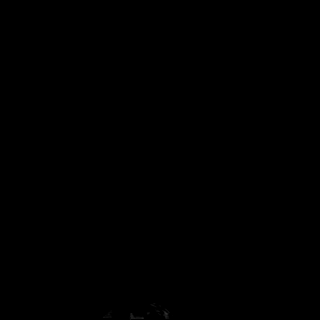

[38 of 40 positions shown; findings below may reference images not displayed]

FINDINGS: Bones/Joint/Cartilage

Evaluation is limited due to excessive patient motion throughout the
evaluation.

Stable marrow edema is seen at the base of the fifth metatarsal and
surrounding the first metatarsophalangeal and interphalangeal
joints, most consistent with reactive changes due to arthropathy.

Edema is also seen within the head of the fourth metatarsal,
immediately adjacent to an area of soft tissue inflammation and
ulceration. There is mild associated enhancement. No evidence of
cortical disruption or erosion. This area is suspicious for early
osteomyelitis.

Progressive degenerative changes are seen throughout the left foot.
Progressive joint space narrowing and osteophyte formation within
the midfoot, metatarsophalangeal joints, and throughout the
interphalangeal joints.

Ligaments

Evaluation of the ligamentous structures is suboptimal due to field
of view selection and patient motion during the exam.

Muscles and Tendons

Evaluation is limited due to patient motion and field of view
selection. No gross tendinous disruption.

Soft tissues

There is soft tissue ulceration, edema, and enhancement in the
plantar aspect of the forefoot at the level of the fourth metatarsal
head. No fluid collection or abscess.

Edema is seen within the musculature surrounding the second and
third metatarsals, compatible with reactive changes. No fluid
collection or abscess.
IMPRESSION: 1. Limited study due to patient motion throughout the exam.
2. Soft tissue ulceration and enhancement within the forefoot at the
level of the fourth metatarsal head, with underlying marrow edema
and enhancement within the fourth metatarsal. No cortical
disruption. Findings favor early osteomyelitis.
3. Reactive edema within the fifth metatarsal and first digit as
above, similar to prior study, and likely related to underlying
degenerative change.
4. Nonspecific soft tissue edema within the musculature adjacent to
the second and third metatarsals, likely reactive. No fluid
collection or abscess.
